# Patient Record
Sex: Female | Born: 1945
Health system: Southern US, Community
[De-identification: ages and names within clinical notes are randomized; demographics above are authoritative.]

## PROBLEM LIST (undated history)

## (undated) DIAGNOSIS — G4730 Sleep apnea, unspecified: Secondary | ICD-10-CM

## (undated) DIAGNOSIS — M810 Age-related osteoporosis without current pathological fracture: Secondary | ICD-10-CM

## (undated) DIAGNOSIS — N390 Urinary tract infection, site not specified: Secondary | ICD-10-CM

## (undated) DIAGNOSIS — K219 Gastro-esophageal reflux disease without esophagitis: Secondary | ICD-10-CM

## (undated) DIAGNOSIS — N200 Calculus of kidney: Secondary | ICD-10-CM

## (undated) DIAGNOSIS — M1990 Unspecified osteoarthritis, unspecified site: Secondary | ICD-10-CM

## (undated) DIAGNOSIS — J449 Chronic obstructive pulmonary disease, unspecified: Secondary | ICD-10-CM

## (undated) DIAGNOSIS — N189 Chronic kidney disease, unspecified: Secondary | ICD-10-CM

## (undated) DIAGNOSIS — I240 Acute coronary thrombosis not resulting in myocardial infarction: Secondary | ICD-10-CM

## (undated) DIAGNOSIS — I878 Other specified disorders of veins: Secondary | ICD-10-CM

## (undated) DIAGNOSIS — F419 Anxiety disorder, unspecified: Secondary | ICD-10-CM

## (undated) DIAGNOSIS — K635 Polyp of colon: Secondary | ICD-10-CM

## (undated) DIAGNOSIS — J45909 Unspecified asthma, uncomplicated: Secondary | ICD-10-CM

## (undated) DIAGNOSIS — E119 Type 2 diabetes mellitus without complications: Secondary | ICD-10-CM

## (undated) DIAGNOSIS — H9190 Unspecified hearing loss, unspecified ear: Secondary | ICD-10-CM

## (undated) HISTORY — PX: OTHER SURGICAL HISTORY: SHX169

## (undated) HISTORY — PX: COLONOSCOPY: SHX174

## (undated) HISTORY — DX: Urinary tract infection, site not specified: N39.0

## (undated) HISTORY — PX: APPENDECTOMY: SHX54

## (undated) HISTORY — DX: Calculus of kidney: N20.0

## (undated) HISTORY — PX: CHOLECYSTECTOMY: SHX55

## (undated) HISTORY — DX: Acute coronary thrombosis not resulting in myocardial infarction: I24.0

---

## 1968-08-28 HISTORY — PX: OTHER SURGICAL HISTORY: SHX169

## 2006-11-27 ENCOUNTER — Ambulatory Visit: Payer: Self-pay | Admitting: Orthopaedic Surgery

## 2007-07-24 ENCOUNTER — Inpatient Hospital Stay: Payer: Self-pay | Admitting: *Deleted

## 2007-09-02 ENCOUNTER — Ambulatory Visit: Payer: Self-pay | Admitting: Internal Medicine

## 2007-09-27 ENCOUNTER — Ambulatory Visit: Payer: Self-pay | Admitting: Urology

## 2007-10-22 ENCOUNTER — Ambulatory Visit: Payer: Self-pay | Admitting: Internal Medicine

## 2008-09-23 ENCOUNTER — Ambulatory Visit: Payer: Self-pay | Admitting: Urology

## 2008-11-19 ENCOUNTER — Ambulatory Visit: Payer: Self-pay | Admitting: Internal Medicine

## 2009-02-07 ENCOUNTER — Emergency Department: Payer: Self-pay | Admitting: Emergency Medicine

## 2009-05-11 ENCOUNTER — Encounter: Payer: Self-pay | Admitting: Internal Medicine

## 2009-05-28 ENCOUNTER — Encounter: Payer: Self-pay | Admitting: Internal Medicine

## 2009-06-28 ENCOUNTER — Encounter: Payer: Self-pay | Admitting: Internal Medicine

## 2009-07-28 ENCOUNTER — Encounter: Payer: Self-pay | Admitting: Internal Medicine

## 2009-08-28 ENCOUNTER — Encounter: Payer: Self-pay | Admitting: Internal Medicine

## 2009-08-28 HISTORY — PX: ROTATOR CUFF REPAIR: SHX139

## 2009-09-22 ENCOUNTER — Ambulatory Visit: Payer: Self-pay | Admitting: Urology

## 2009-10-14 ENCOUNTER — Ambulatory Visit: Payer: Self-pay | Admitting: Urology

## 2009-10-26 ENCOUNTER — Ambulatory Visit: Payer: Self-pay | Admitting: Urology

## 2010-01-21 ENCOUNTER — Ambulatory Visit: Payer: Self-pay | Admitting: Internal Medicine

## 2010-02-02 ENCOUNTER — Emergency Department: Payer: Self-pay | Admitting: Unknown Physician Specialty

## 2010-03-09 ENCOUNTER — Ambulatory Visit: Payer: Self-pay | Admitting: Urology

## 2010-03-17 ENCOUNTER — Ambulatory Visit: Payer: Self-pay | Admitting: Gastroenterology

## 2010-03-21 LAB — PATHOLOGY REPORT

## 2010-06-25 ENCOUNTER — Ambulatory Visit: Payer: Self-pay

## 2010-11-23 ENCOUNTER — Ambulatory Visit: Payer: Self-pay | Admitting: Urology

## 2011-06-05 ENCOUNTER — Ambulatory Visit: Payer: Self-pay | Admitting: Internal Medicine

## 2011-09-05 DIAGNOSIS — J441 Chronic obstructive pulmonary disease with (acute) exacerbation: Secondary | ICD-10-CM | POA: Diagnosis not present

## 2011-09-05 DIAGNOSIS — N390 Urinary tract infection, site not specified: Secondary | ICD-10-CM | POA: Diagnosis not present

## 2011-09-05 DIAGNOSIS — M159 Polyosteoarthritis, unspecified: Secondary | ICD-10-CM | POA: Diagnosis not present

## 2011-09-05 DIAGNOSIS — R300 Dysuria: Secondary | ICD-10-CM | POA: Diagnosis not present

## 2011-09-05 DIAGNOSIS — E782 Mixed hyperlipidemia: Secondary | ICD-10-CM | POA: Diagnosis not present

## 2011-10-25 DIAGNOSIS — R300 Dysuria: Secondary | ICD-10-CM | POA: Diagnosis not present

## 2011-10-25 DIAGNOSIS — R319 Hematuria, unspecified: Secondary | ICD-10-CM | POA: Diagnosis not present

## 2011-10-25 DIAGNOSIS — R1084 Generalized abdominal pain: Secondary | ICD-10-CM | POA: Diagnosis not present

## 2011-10-25 DIAGNOSIS — J45909 Unspecified asthma, uncomplicated: Secondary | ICD-10-CM | POA: Diagnosis not present

## 2011-10-25 DIAGNOSIS — IMO0002 Reserved for concepts with insufficient information to code with codable children: Secondary | ICD-10-CM | POA: Diagnosis not present

## 2011-11-02 ENCOUNTER — Ambulatory Visit: Payer: Self-pay

## 2011-11-02 DIAGNOSIS — I7090 Unspecified atherosclerosis: Secondary | ICD-10-CM | POA: Diagnosis not present

## 2011-11-02 DIAGNOSIS — N200 Calculus of kidney: Secondary | ICD-10-CM | POA: Diagnosis not present

## 2011-11-02 DIAGNOSIS — M5137 Other intervertebral disc degeneration, lumbosacral region: Secondary | ICD-10-CM | POA: Diagnosis not present

## 2011-11-02 DIAGNOSIS — R109 Unspecified abdominal pain: Secondary | ICD-10-CM | POA: Diagnosis not present

## 2011-11-02 DIAGNOSIS — Z9089 Acquired absence of other organs: Secondary | ICD-10-CM | POA: Diagnosis not present

## 2011-11-23 DIAGNOSIS — E782 Mixed hyperlipidemia: Secondary | ICD-10-CM | POA: Diagnosis not present

## 2011-11-23 DIAGNOSIS — R300 Dysuria: Secondary | ICD-10-CM | POA: Diagnosis not present

## 2011-11-23 DIAGNOSIS — IMO0002 Reserved for concepts with insufficient information to code with codable children: Secondary | ICD-10-CM | POA: Diagnosis not present

## 2011-11-23 DIAGNOSIS — M545 Low back pain: Secondary | ICD-10-CM | POA: Diagnosis not present

## 2011-11-23 DIAGNOSIS — R0602 Shortness of breath: Secondary | ICD-10-CM | POA: Diagnosis not present

## 2011-11-23 DIAGNOSIS — R609 Edema, unspecified: Secondary | ICD-10-CM | POA: Diagnosis not present

## 2011-11-27 ENCOUNTER — Ambulatory Visit: Payer: Self-pay | Admitting: Urology

## 2011-11-27 DIAGNOSIS — N3020 Other chronic cystitis without hematuria: Secondary | ICD-10-CM | POA: Diagnosis not present

## 2011-11-27 DIAGNOSIS — N3946 Mixed incontinence: Secondary | ICD-10-CM | POA: Diagnosis not present

## 2011-11-27 DIAGNOSIS — N200 Calculus of kidney: Secondary | ICD-10-CM | POA: Diagnosis not present

## 2011-11-27 DIAGNOSIS — Q615 Medullary cystic kidney: Secondary | ICD-10-CM | POA: Diagnosis not present

## 2011-12-01 DIAGNOSIS — R0609 Other forms of dyspnea: Secondary | ICD-10-CM | POA: Diagnosis not present

## 2011-12-01 DIAGNOSIS — R0989 Other specified symptoms and signs involving the circulatory and respiratory systems: Secondary | ICD-10-CM | POA: Diagnosis not present

## 2011-12-01 DIAGNOSIS — R609 Edema, unspecified: Secondary | ICD-10-CM | POA: Diagnosis not present

## 2011-12-05 DIAGNOSIS — E782 Mixed hyperlipidemia: Secondary | ICD-10-CM | POA: Diagnosis not present

## 2011-12-05 DIAGNOSIS — I10 Essential (primary) hypertension: Secondary | ICD-10-CM | POA: Diagnosis not present

## 2011-12-05 DIAGNOSIS — Z79899 Other long term (current) drug therapy: Secondary | ICD-10-CM | POA: Diagnosis not present

## 2011-12-05 DIAGNOSIS — R609 Edema, unspecified: Secondary | ICD-10-CM | POA: Diagnosis not present

## 2011-12-18 DIAGNOSIS — R609 Edema, unspecified: Secondary | ICD-10-CM | POA: Diagnosis not present

## 2011-12-18 DIAGNOSIS — G4720 Circadian rhythm sleep disorder, unspecified type: Secondary | ICD-10-CM | POA: Diagnosis not present

## 2011-12-18 DIAGNOSIS — G4710 Hypersomnia, unspecified: Secondary | ICD-10-CM | POA: Diagnosis not present

## 2011-12-18 DIAGNOSIS — G4730 Sleep apnea, unspecified: Secondary | ICD-10-CM | POA: Diagnosis not present

## 2011-12-26 DIAGNOSIS — Z0000 Encounter for general adult medical examination without abnormal findings: Secondary | ICD-10-CM | POA: Diagnosis not present

## 2011-12-26 DIAGNOSIS — R300 Dysuria: Secondary | ICD-10-CM | POA: Diagnosis not present

## 2011-12-26 DIAGNOSIS — R609 Edema, unspecified: Secondary | ICD-10-CM | POA: Diagnosis not present

## 2011-12-26 DIAGNOSIS — Z124 Encounter for screening for malignant neoplasm of cervix: Secondary | ICD-10-CM | POA: Diagnosis not present

## 2011-12-26 DIAGNOSIS — I8310 Varicose veins of unspecified lower extremity with inflammation: Secondary | ICD-10-CM | POA: Diagnosis not present

## 2012-01-02 DIAGNOSIS — R5383 Other fatigue: Secondary | ICD-10-CM | POA: Diagnosis not present

## 2012-01-02 DIAGNOSIS — J449 Chronic obstructive pulmonary disease, unspecified: Secondary | ICD-10-CM | POA: Diagnosis not present

## 2012-01-02 DIAGNOSIS — F17200 Nicotine dependence, unspecified, uncomplicated: Secondary | ICD-10-CM | POA: Diagnosis not present

## 2012-01-02 DIAGNOSIS — F419 Anxiety disorder, unspecified: Secondary | ICD-10-CM

## 2012-01-02 DIAGNOSIS — G4733 Obstructive sleep apnea (adult) (pediatric): Secondary | ICD-10-CM | POA: Diagnosis not present

## 2012-01-02 DIAGNOSIS — E669 Obesity, unspecified: Secondary | ICD-10-CM | POA: Insufficient documentation

## 2012-01-02 DIAGNOSIS — I253 Aneurysm of heart: Secondary | ICD-10-CM | POA: Diagnosis not present

## 2012-01-02 DIAGNOSIS — Z720 Tobacco use: Secondary | ICD-10-CM | POA: Insufficient documentation

## 2012-01-02 DIAGNOSIS — R609 Edema, unspecified: Secondary | ICD-10-CM | POA: Diagnosis not present

## 2012-01-02 DIAGNOSIS — H9190 Unspecified hearing loss, unspecified ear: Secondary | ICD-10-CM | POA: Insufficient documentation

## 2012-01-02 DIAGNOSIS — R0602 Shortness of breath: Secondary | ICD-10-CM | POA: Diagnosis not present

## 2012-01-02 DIAGNOSIS — K219 Gastro-esophageal reflux disease without esophagitis: Secondary | ICD-10-CM | POA: Insufficient documentation

## 2012-01-02 DIAGNOSIS — M79609 Pain in unspecified limb: Secondary | ICD-10-CM | POA: Diagnosis not present

## 2012-01-02 DIAGNOSIS — G4730 Sleep apnea, unspecified: Secondary | ICD-10-CM | POA: Insufficient documentation

## 2012-01-02 DIAGNOSIS — R5381 Other malaise: Secondary | ICD-10-CM | POA: Diagnosis not present

## 2012-01-02 DIAGNOSIS — I872 Venous insufficiency (chronic) (peripheral): Secondary | ICD-10-CM | POA: Insufficient documentation

## 2012-01-02 DIAGNOSIS — E785 Hyperlipidemia, unspecified: Secondary | ICD-10-CM | POA: Insufficient documentation

## 2012-01-02 DIAGNOSIS — E782 Mixed hyperlipidemia: Secondary | ICD-10-CM | POA: Insufficient documentation

## 2012-01-02 DIAGNOSIS — N200 Calculus of kidney: Secondary | ICD-10-CM | POA: Insufficient documentation

## 2012-01-02 HISTORY — DX: Anxiety disorder, unspecified: F41.9

## 2012-01-05 DIAGNOSIS — G4720 Circadian rhythm sleep disorder, unspecified type: Secondary | ICD-10-CM | POA: Diagnosis not present

## 2012-01-05 DIAGNOSIS — G4730 Sleep apnea, unspecified: Secondary | ICD-10-CM | POA: Diagnosis not present

## 2012-01-10 DIAGNOSIS — L608 Other nail disorders: Secondary | ICD-10-CM | POA: Diagnosis not present

## 2012-01-10 DIAGNOSIS — B351 Tinea unguium: Secondary | ICD-10-CM | POA: Diagnosis not present

## 2012-01-15 DIAGNOSIS — G4720 Circadian rhythm sleep disorder, unspecified type: Secondary | ICD-10-CM | POA: Diagnosis not present

## 2012-01-15 DIAGNOSIS — R609 Edema, unspecified: Secondary | ICD-10-CM | POA: Diagnosis not present

## 2012-01-15 DIAGNOSIS — G4730 Sleep apnea, unspecified: Secondary | ICD-10-CM | POA: Diagnosis not present

## 2012-01-17 DIAGNOSIS — G4730 Sleep apnea, unspecified: Secondary | ICD-10-CM | POA: Diagnosis not present

## 2012-01-17 DIAGNOSIS — G4710 Hypersomnia, unspecified: Secondary | ICD-10-CM | POA: Diagnosis not present

## 2012-01-25 ENCOUNTER — Ambulatory Visit: Payer: Self-pay | Admitting: Internal Medicine

## 2012-01-25 DIAGNOSIS — Z1231 Encounter for screening mammogram for malignant neoplasm of breast: Secondary | ICD-10-CM | POA: Diagnosis not present

## 2012-01-25 DIAGNOSIS — H02059 Trichiasis without entropian unspecified eye, unspecified eyelid: Secondary | ICD-10-CM | POA: Diagnosis not present

## 2012-03-08 ENCOUNTER — Ambulatory Visit: Payer: Self-pay | Admitting: Internal Medicine

## 2012-03-08 DIAGNOSIS — L02419 Cutaneous abscess of limb, unspecified: Secondary | ICD-10-CM | POA: Diagnosis not present

## 2012-03-08 DIAGNOSIS — R609 Edema, unspecified: Secondary | ICD-10-CM | POA: Diagnosis not present

## 2012-03-08 DIAGNOSIS — M79609 Pain in unspecified limb: Secondary | ICD-10-CM | POA: Diagnosis not present

## 2012-03-08 DIAGNOSIS — M7989 Other specified soft tissue disorders: Secondary | ICD-10-CM | POA: Diagnosis not present

## 2012-03-12 DIAGNOSIS — J449 Chronic obstructive pulmonary disease, unspecified: Secondary | ICD-10-CM | POA: Diagnosis not present

## 2012-03-12 DIAGNOSIS — L02419 Cutaneous abscess of limb, unspecified: Secondary | ICD-10-CM | POA: Diagnosis not present

## 2012-03-12 DIAGNOSIS — I253 Aneurysm of heart: Secondary | ICD-10-CM | POA: Diagnosis not present

## 2012-03-12 DIAGNOSIS — L03119 Cellulitis of unspecified part of limb: Secondary | ICD-10-CM | POA: Diagnosis not present

## 2012-03-12 DIAGNOSIS — E782 Mixed hyperlipidemia: Secondary | ICD-10-CM | POA: Diagnosis not present

## 2012-03-19 DIAGNOSIS — R609 Edema, unspecified: Secondary | ICD-10-CM | POA: Diagnosis not present

## 2012-03-19 DIAGNOSIS — I739 Peripheral vascular disease, unspecified: Secondary | ICD-10-CM | POA: Diagnosis not present

## 2012-03-19 DIAGNOSIS — L02419 Cutaneous abscess of limb, unspecified: Secondary | ICD-10-CM | POA: Diagnosis not present

## 2012-03-20 DIAGNOSIS — L259 Unspecified contact dermatitis, unspecified cause: Secondary | ICD-10-CM | POA: Diagnosis not present

## 2012-03-20 DIAGNOSIS — L8990 Pressure ulcer of unspecified site, unspecified stage: Secondary | ICD-10-CM | POA: Diagnosis not present

## 2012-03-26 DIAGNOSIS — D485 Neoplasm of uncertain behavior of skin: Secondary | ICD-10-CM | POA: Diagnosis not present

## 2012-03-26 DIAGNOSIS — I8310 Varicose veins of unspecified lower extremity with inflammation: Secondary | ICD-10-CM | POA: Diagnosis not present

## 2012-03-27 DIAGNOSIS — L02419 Cutaneous abscess of limb, unspecified: Secondary | ICD-10-CM | POA: Diagnosis not present

## 2012-03-27 DIAGNOSIS — B373 Candidiasis of vulva and vagina: Secondary | ICD-10-CM | POA: Diagnosis not present

## 2012-03-27 DIAGNOSIS — I739 Peripheral vascular disease, unspecified: Secondary | ICD-10-CM | POA: Diagnosis not present

## 2012-03-27 DIAGNOSIS — L03119 Cellulitis of unspecified part of limb: Secondary | ICD-10-CM | POA: Diagnosis not present

## 2012-03-28 DIAGNOSIS — H903 Sensorineural hearing loss, bilateral: Secondary | ICD-10-CM | POA: Diagnosis not present

## 2012-04-02 DIAGNOSIS — D485 Neoplasm of uncertain behavior of skin: Secondary | ICD-10-CM | POA: Diagnosis not present

## 2012-04-02 DIAGNOSIS — I8310 Varicose veins of unspecified lower extremity with inflammation: Secondary | ICD-10-CM | POA: Diagnosis not present

## 2012-04-08 DIAGNOSIS — D485 Neoplasm of uncertain behavior of skin: Secondary | ICD-10-CM | POA: Diagnosis not present

## 2012-04-08 DIAGNOSIS — I8310 Varicose veins of unspecified lower extremity with inflammation: Secondary | ICD-10-CM | POA: Diagnosis not present

## 2012-04-10 DIAGNOSIS — H903 Sensorineural hearing loss, bilateral: Secondary | ICD-10-CM | POA: Diagnosis not present

## 2012-04-10 DIAGNOSIS — I8310 Varicose veins of unspecified lower extremity with inflammation: Secondary | ICD-10-CM | POA: Diagnosis not present

## 2012-04-10 DIAGNOSIS — H9120 Sudden idiopathic hearing loss, unspecified ear: Secondary | ICD-10-CM | POA: Diagnosis not present

## 2012-04-16 DIAGNOSIS — I8310 Varicose veins of unspecified lower extremity with inflammation: Secondary | ICD-10-CM | POA: Diagnosis not present

## 2012-04-22 DIAGNOSIS — H60509 Unspecified acute noninfective otitis externa, unspecified ear: Secondary | ICD-10-CM | POA: Diagnosis not present

## 2012-04-22 DIAGNOSIS — I8310 Varicose veins of unspecified lower extremity with inflammation: Secondary | ICD-10-CM | POA: Diagnosis not present

## 2012-04-22 DIAGNOSIS — H903 Sensorineural hearing loss, bilateral: Secondary | ICD-10-CM | POA: Diagnosis not present

## 2012-04-22 DIAGNOSIS — H9120 Sudden idiopathic hearing loss, unspecified ear: Secondary | ICD-10-CM | POA: Diagnosis not present

## 2012-04-30 DIAGNOSIS — R609 Edema, unspecified: Secondary | ICD-10-CM | POA: Diagnosis not present

## 2012-04-30 DIAGNOSIS — I10 Essential (primary) hypertension: Secondary | ICD-10-CM | POA: Diagnosis not present

## 2012-04-30 DIAGNOSIS — I253 Aneurysm of heart: Secondary | ICD-10-CM | POA: Diagnosis not present

## 2012-04-30 DIAGNOSIS — B373 Candidiasis of vulva and vagina: Secondary | ICD-10-CM | POA: Diagnosis not present

## 2012-04-30 DIAGNOSIS — L03119 Cellulitis of unspecified part of limb: Secondary | ICD-10-CM | POA: Diagnosis not present

## 2012-04-30 DIAGNOSIS — G4730 Sleep apnea, unspecified: Secondary | ICD-10-CM | POA: Diagnosis not present

## 2012-04-30 DIAGNOSIS — L02419 Cutaneous abscess of limb, unspecified: Secondary | ICD-10-CM | POA: Diagnosis not present

## 2012-04-30 DIAGNOSIS — I8310 Varicose veins of unspecified lower extremity with inflammation: Secondary | ICD-10-CM | POA: Diagnosis not present

## 2012-05-07 DIAGNOSIS — H02059 Trichiasis without entropian unspecified eye, unspecified eyelid: Secondary | ICD-10-CM | POA: Diagnosis not present

## 2012-05-07 DIAGNOSIS — I8310 Varicose veins of unspecified lower extremity with inflammation: Secondary | ICD-10-CM | POA: Diagnosis not present

## 2012-05-09 DIAGNOSIS — G4730 Sleep apnea, unspecified: Secondary | ICD-10-CM | POA: Diagnosis not present

## 2012-05-09 DIAGNOSIS — G4710 Hypersomnia, unspecified: Secondary | ICD-10-CM | POA: Diagnosis not present

## 2012-05-09 DIAGNOSIS — G4720 Circadian rhythm sleep disorder, unspecified type: Secondary | ICD-10-CM | POA: Diagnosis not present

## 2012-05-09 DIAGNOSIS — R609 Edema, unspecified: Secondary | ICD-10-CM | POA: Diagnosis not present

## 2012-05-16 DIAGNOSIS — L98499 Non-pressure chronic ulcer of skin of other sites with unspecified severity: Secondary | ICD-10-CM | POA: Diagnosis not present

## 2012-05-16 DIAGNOSIS — I8310 Varicose veins of unspecified lower extremity with inflammation: Secondary | ICD-10-CM | POA: Diagnosis not present

## 2012-06-26 DIAGNOSIS — M25579 Pain in unspecified ankle and joints of unspecified foot: Secondary | ICD-10-CM | POA: Diagnosis not present

## 2012-06-26 DIAGNOSIS — Q169 Congenital malformation of ear causing impairment of hearing, unspecified: Secondary | ICD-10-CM | POA: Diagnosis not present

## 2012-06-26 DIAGNOSIS — H60399 Other infective otitis externa, unspecified ear: Secondary | ICD-10-CM | POA: Diagnosis not present

## 2012-06-26 DIAGNOSIS — I739 Peripheral vascular disease, unspecified: Secondary | ICD-10-CM | POA: Diagnosis not present

## 2012-06-26 DIAGNOSIS — I10 Essential (primary) hypertension: Secondary | ICD-10-CM | POA: Diagnosis not present

## 2012-06-26 DIAGNOSIS — R609 Edema, unspecified: Secondary | ICD-10-CM | POA: Diagnosis not present

## 2012-06-26 DIAGNOSIS — L98499 Non-pressure chronic ulcer of skin of other sites with unspecified severity: Secondary | ICD-10-CM | POA: Diagnosis not present

## 2012-06-26 DIAGNOSIS — H9190 Unspecified hearing loss, unspecified ear: Secondary | ICD-10-CM | POA: Diagnosis not present

## 2012-06-26 DIAGNOSIS — M7130 Other bursal cyst, unspecified site: Secondary | ICD-10-CM | POA: Diagnosis not present

## 2012-07-11 DIAGNOSIS — H9190 Unspecified hearing loss, unspecified ear: Secondary | ICD-10-CM | POA: Diagnosis not present

## 2012-07-11 DIAGNOSIS — R609 Edema, unspecified: Secondary | ICD-10-CM | POA: Diagnosis not present

## 2012-07-11 DIAGNOSIS — H60399 Other infective otitis externa, unspecified ear: Secondary | ICD-10-CM | POA: Diagnosis not present

## 2012-07-11 DIAGNOSIS — Z23 Encounter for immunization: Secondary | ICD-10-CM | POA: Diagnosis not present

## 2012-07-18 DIAGNOSIS — H60509 Unspecified acute noninfective otitis externa, unspecified ear: Secondary | ICD-10-CM | POA: Diagnosis not present

## 2012-07-18 DIAGNOSIS — H903 Sensorineural hearing loss, bilateral: Secondary | ICD-10-CM | POA: Diagnosis not present

## 2012-07-18 DIAGNOSIS — J342 Deviated nasal septum: Secondary | ICD-10-CM | POA: Diagnosis not present

## 2012-09-10 DIAGNOSIS — D485 Neoplasm of uncertain behavior of skin: Secondary | ICD-10-CM | POA: Diagnosis not present

## 2012-09-30 DIAGNOSIS — G4710 Hypersomnia, unspecified: Secondary | ICD-10-CM | POA: Diagnosis not present

## 2012-09-30 DIAGNOSIS — G4730 Sleep apnea, unspecified: Secondary | ICD-10-CM | POA: Diagnosis not present

## 2012-09-30 DIAGNOSIS — R319 Hematuria, unspecified: Secondary | ICD-10-CM | POA: Diagnosis not present

## 2012-09-30 DIAGNOSIS — R300 Dysuria: Secondary | ICD-10-CM | POA: Diagnosis not present

## 2012-09-30 DIAGNOSIS — IMO0002 Reserved for concepts with insufficient information to code with codable children: Secondary | ICD-10-CM | POA: Diagnosis not present

## 2012-09-30 DIAGNOSIS — N390 Urinary tract infection, site not specified: Secondary | ICD-10-CM | POA: Diagnosis not present

## 2012-09-30 DIAGNOSIS — I739 Peripheral vascular disease, unspecified: Secondary | ICD-10-CM | POA: Diagnosis not present

## 2012-09-30 DIAGNOSIS — H9190 Unspecified hearing loss, unspecified ear: Secondary | ICD-10-CM | POA: Diagnosis not present

## 2012-09-30 DIAGNOSIS — I10 Essential (primary) hypertension: Secondary | ICD-10-CM | POA: Diagnosis not present

## 2012-10-23 DIAGNOSIS — H903 Sensorineural hearing loss, bilateral: Secondary | ICD-10-CM | POA: Diagnosis not present

## 2012-10-23 DIAGNOSIS — J342 Deviated nasal septum: Secondary | ICD-10-CM | POA: Diagnosis not present

## 2012-11-19 DIAGNOSIS — N3946 Mixed incontinence: Secondary | ICD-10-CM | POA: Insufficient documentation

## 2012-11-19 DIAGNOSIS — Q615 Medullary cystic kidney: Secondary | ICD-10-CM | POA: Insufficient documentation

## 2012-11-19 DIAGNOSIS — N3020 Other chronic cystitis without hematuria: Secondary | ICD-10-CM | POA: Insufficient documentation

## 2012-11-19 DIAGNOSIS — R339 Retention of urine, unspecified: Secondary | ICD-10-CM | POA: Insufficient documentation

## 2012-11-27 ENCOUNTER — Ambulatory Visit: Payer: Self-pay | Admitting: Urology

## 2012-11-27 DIAGNOSIS — R350 Frequency of micturition: Secondary | ICD-10-CM | POA: Diagnosis not present

## 2012-11-27 DIAGNOSIS — N23 Unspecified renal colic: Secondary | ICD-10-CM | POA: Diagnosis not present

## 2012-11-27 DIAGNOSIS — N3020 Other chronic cystitis without hematuria: Secondary | ICD-10-CM | POA: Diagnosis not present

## 2012-11-27 DIAGNOSIS — N3946 Mixed incontinence: Secondary | ICD-10-CM | POA: Diagnosis not present

## 2012-11-27 DIAGNOSIS — N200 Calculus of kidney: Secondary | ICD-10-CM | POA: Diagnosis not present

## 2012-12-18 DIAGNOSIS — G4720 Circadian rhythm sleep disorder, unspecified type: Secondary | ICD-10-CM | POA: Diagnosis not present

## 2012-12-18 DIAGNOSIS — R0602 Shortness of breath: Secondary | ICD-10-CM | POA: Diagnosis not present

## 2012-12-18 DIAGNOSIS — G4710 Hypersomnia, unspecified: Secondary | ICD-10-CM | POA: Diagnosis not present

## 2012-12-30 DIAGNOSIS — J45909 Unspecified asthma, uncomplicated: Secondary | ICD-10-CM | POA: Diagnosis not present

## 2012-12-30 DIAGNOSIS — H9190 Unspecified hearing loss, unspecified ear: Secondary | ICD-10-CM | POA: Diagnosis not present

## 2012-12-30 DIAGNOSIS — I739 Peripheral vascular disease, unspecified: Secondary | ICD-10-CM | POA: Diagnosis not present

## 2012-12-30 DIAGNOSIS — R609 Edema, unspecified: Secondary | ICD-10-CM | POA: Diagnosis not present

## 2012-12-30 DIAGNOSIS — M25579 Pain in unspecified ankle and joints of unspecified foot: Secondary | ICD-10-CM | POA: Diagnosis not present

## 2012-12-30 DIAGNOSIS — M545 Low back pain: Secondary | ICD-10-CM | POA: Diagnosis not present

## 2012-12-30 DIAGNOSIS — I10 Essential (primary) hypertension: Secondary | ICD-10-CM | POA: Diagnosis not present

## 2013-01-27 DIAGNOSIS — Z006 Encounter for examination for normal comparison and control in clinical research program: Secondary | ICD-10-CM | POA: Diagnosis not present

## 2013-01-27 DIAGNOSIS — I739 Peripheral vascular disease, unspecified: Secondary | ICD-10-CM | POA: Diagnosis not present

## 2013-01-27 DIAGNOSIS — J449 Chronic obstructive pulmonary disease, unspecified: Secondary | ICD-10-CM | POA: Diagnosis not present

## 2013-01-27 DIAGNOSIS — N393 Stress incontinence (female) (male): Secondary | ICD-10-CM | POA: Diagnosis not present

## 2013-01-27 DIAGNOSIS — G4730 Sleep apnea, unspecified: Secondary | ICD-10-CM | POA: Diagnosis not present

## 2013-01-27 DIAGNOSIS — G4710 Hypersomnia, unspecified: Secondary | ICD-10-CM | POA: Diagnosis not present

## 2013-01-27 DIAGNOSIS — E669 Obesity, unspecified: Secondary | ICD-10-CM | POA: Diagnosis not present

## 2013-01-28 DIAGNOSIS — H6520 Chronic serous otitis media, unspecified ear: Secondary | ICD-10-CM | POA: Diagnosis not present

## 2013-01-28 DIAGNOSIS — H6980 Other specified disorders of Eustachian tube, unspecified ear: Secondary | ICD-10-CM | POA: Diagnosis not present

## 2013-01-28 DIAGNOSIS — J069 Acute upper respiratory infection, unspecified: Secondary | ICD-10-CM | POA: Diagnosis not present

## 2013-02-18 ENCOUNTER — Ambulatory Visit: Payer: Self-pay | Admitting: Physician Assistant

## 2013-02-18 DIAGNOSIS — M545 Low back pain, unspecified: Secondary | ICD-10-CM | POA: Diagnosis not present

## 2013-02-18 DIAGNOSIS — I739 Peripheral vascular disease, unspecified: Secondary | ICD-10-CM | POA: Diagnosis not present

## 2013-02-18 DIAGNOSIS — E782 Mixed hyperlipidemia: Secondary | ICD-10-CM | POA: Diagnosis not present

## 2013-02-18 DIAGNOSIS — D649 Anemia, unspecified: Secondary | ICD-10-CM | POA: Diagnosis not present

## 2013-02-18 DIAGNOSIS — R609 Edema, unspecified: Secondary | ICD-10-CM | POA: Diagnosis not present

## 2013-02-18 DIAGNOSIS — E559 Vitamin D deficiency, unspecified: Secondary | ICD-10-CM | POA: Diagnosis not present

## 2013-02-18 DIAGNOSIS — I10 Essential (primary) hypertension: Secondary | ICD-10-CM | POA: Diagnosis not present

## 2013-02-18 DIAGNOSIS — I2510 Atherosclerotic heart disease of native coronary artery without angina pectoris: Secondary | ICD-10-CM | POA: Diagnosis not present

## 2013-02-18 DIAGNOSIS — M47817 Spondylosis without myelopathy or radiculopathy, lumbosacral region: Secondary | ICD-10-CM | POA: Diagnosis not present

## 2013-02-18 DIAGNOSIS — R209 Unspecified disturbances of skin sensation: Secondary | ICD-10-CM | POA: Diagnosis not present

## 2013-02-18 DIAGNOSIS — M5137 Other intervertebral disc degeneration, lumbosacral region: Secondary | ICD-10-CM | POA: Diagnosis not present

## 2013-03-05 DIAGNOSIS — Z1283 Encounter for screening for malignant neoplasm of skin: Secondary | ICD-10-CM | POA: Diagnosis not present

## 2013-03-05 DIAGNOSIS — J449 Chronic obstructive pulmonary disease, unspecified: Secondary | ICD-10-CM | POA: Diagnosis not present

## 2013-03-05 DIAGNOSIS — L570 Actinic keratosis: Secondary | ICD-10-CM | POA: Diagnosis not present

## 2013-03-05 DIAGNOSIS — M79609 Pain in unspecified limb: Secondary | ICD-10-CM | POA: Diagnosis not present

## 2013-03-05 DIAGNOSIS — E782 Mixed hyperlipidemia: Secondary | ICD-10-CM | POA: Diagnosis not present

## 2013-03-05 DIAGNOSIS — D485 Neoplasm of uncertain behavior of skin: Secondary | ICD-10-CM | POA: Diagnosis not present

## 2013-03-05 DIAGNOSIS — H9190 Unspecified hearing loss, unspecified ear: Secondary | ICD-10-CM | POA: Diagnosis not present

## 2013-03-05 DIAGNOSIS — D235 Other benign neoplasm of skin of trunk: Secondary | ICD-10-CM | POA: Diagnosis not present

## 2013-03-05 DIAGNOSIS — I10 Essential (primary) hypertension: Secondary | ICD-10-CM | POA: Diagnosis not present

## 2013-03-05 DIAGNOSIS — D2310 Other benign neoplasm of skin of unspecified eyelid, including canthus: Secondary | ICD-10-CM | POA: Diagnosis not present

## 2013-03-05 DIAGNOSIS — Z872 Personal history of diseases of the skin and subcutaneous tissue: Secondary | ICD-10-CM | POA: Diagnosis not present

## 2013-03-05 DIAGNOSIS — E119 Type 2 diabetes mellitus without complications: Secondary | ICD-10-CM | POA: Diagnosis not present

## 2013-03-05 DIAGNOSIS — E559 Vitamin D deficiency, unspecified: Secondary | ICD-10-CM | POA: Diagnosis not present

## 2013-03-05 DIAGNOSIS — I739 Peripheral vascular disease, unspecified: Secondary | ICD-10-CM | POA: Diagnosis not present

## 2013-03-05 DIAGNOSIS — D2360 Other benign neoplasm of skin of unspecified upper limb, including shoulder: Secondary | ICD-10-CM | POA: Diagnosis not present

## 2013-03-05 DIAGNOSIS — I7090 Unspecified atherosclerosis: Secondary | ICD-10-CM | POA: Diagnosis not present

## 2013-03-24 ENCOUNTER — Ambulatory Visit: Payer: Self-pay | Admitting: Internal Medicine

## 2013-03-24 DIAGNOSIS — Z7189 Other specified counseling: Secondary | ICD-10-CM | POA: Diagnosis not present

## 2013-03-24 DIAGNOSIS — E119 Type 2 diabetes mellitus without complications: Secondary | ICD-10-CM | POA: Diagnosis not present

## 2013-03-27 DIAGNOSIS — M79609 Pain in unspecified limb: Secondary | ICD-10-CM | POA: Diagnosis not present

## 2013-03-28 DIAGNOSIS — E119 Type 2 diabetes mellitus without complications: Secondary | ICD-10-CM | POA: Diagnosis not present

## 2013-03-28 DIAGNOSIS — Z7189 Other specified counseling: Secondary | ICD-10-CM | POA: Diagnosis not present

## 2013-03-31 DIAGNOSIS — M79609 Pain in unspecified limb: Secondary | ICD-10-CM | POA: Diagnosis not present

## 2013-03-31 DIAGNOSIS — I8310 Varicose veins of unspecified lower extremity with inflammation: Secondary | ICD-10-CM | POA: Diagnosis not present

## 2013-03-31 DIAGNOSIS — I739 Peripheral vascular disease, unspecified: Secondary | ICD-10-CM | POA: Diagnosis not present

## 2013-03-31 DIAGNOSIS — I7090 Unspecified atherosclerosis: Secondary | ICD-10-CM | POA: Diagnosis not present

## 2013-03-31 DIAGNOSIS — I10 Essential (primary) hypertension: Secondary | ICD-10-CM | POA: Diagnosis not present

## 2013-03-31 DIAGNOSIS — H9190 Unspecified hearing loss, unspecified ear: Secondary | ICD-10-CM | POA: Diagnosis not present

## 2013-03-31 DIAGNOSIS — J449 Chronic obstructive pulmonary disease, unspecified: Secondary | ICD-10-CM | POA: Diagnosis not present

## 2013-03-31 DIAGNOSIS — E119 Type 2 diabetes mellitus without complications: Secondary | ICD-10-CM | POA: Diagnosis not present

## 2013-04-08 DIAGNOSIS — J45909 Unspecified asthma, uncomplicated: Secondary | ICD-10-CM | POA: Diagnosis not present

## 2013-04-08 DIAGNOSIS — R0602 Shortness of breath: Secondary | ICD-10-CM | POA: Diagnosis not present

## 2013-04-10 DIAGNOSIS — R5381 Other malaise: Secondary | ICD-10-CM | POA: Diagnosis not present

## 2013-04-10 DIAGNOSIS — E119 Type 2 diabetes mellitus without complications: Secondary | ICD-10-CM | POA: Diagnosis not present

## 2013-04-10 DIAGNOSIS — R5383 Other fatigue: Secondary | ICD-10-CM | POA: Diagnosis not present

## 2013-04-10 DIAGNOSIS — F329 Major depressive disorder, single episode, unspecified: Secondary | ICD-10-CM | POA: Diagnosis not present

## 2013-04-10 DIAGNOSIS — J45909 Unspecified asthma, uncomplicated: Secondary | ICD-10-CM | POA: Diagnosis not present

## 2013-04-10 DIAGNOSIS — J309 Allergic rhinitis, unspecified: Secondary | ICD-10-CM | POA: Diagnosis not present

## 2013-04-10 DIAGNOSIS — F411 Generalized anxiety disorder: Secondary | ICD-10-CM | POA: Diagnosis not present

## 2013-04-15 ENCOUNTER — Ambulatory Visit: Payer: Self-pay | Admitting: Internal Medicine

## 2013-04-15 DIAGNOSIS — IMO0002 Reserved for concepts with insufficient information to code with codable children: Secondary | ICD-10-CM | POA: Diagnosis not present

## 2013-04-15 DIAGNOSIS — M545 Low back pain, unspecified: Secondary | ICD-10-CM | POA: Diagnosis not present

## 2013-04-15 DIAGNOSIS — M5126 Other intervertebral disc displacement, lumbar region: Secondary | ICD-10-CM | POA: Diagnosis not present

## 2013-04-16 DIAGNOSIS — G4710 Hypersomnia, unspecified: Secondary | ICD-10-CM | POA: Diagnosis not present

## 2013-04-22 DIAGNOSIS — H7290 Unspecified perforation of tympanic membrane, unspecified ear: Secondary | ICD-10-CM | POA: Diagnosis not present

## 2013-04-22 DIAGNOSIS — H60509 Unspecified acute noninfective otitis externa, unspecified ear: Secondary | ICD-10-CM | POA: Diagnosis not present

## 2013-04-22 DIAGNOSIS — H6980 Other specified disorders of Eustachian tube, unspecified ear: Secondary | ICD-10-CM | POA: Diagnosis not present

## 2013-04-22 DIAGNOSIS — H903 Sensorineural hearing loss, bilateral: Secondary | ICD-10-CM | POA: Diagnosis not present

## 2013-04-23 ENCOUNTER — Ambulatory Visit: Payer: Self-pay | Admitting: Internal Medicine

## 2013-04-23 DIAGNOSIS — D2360 Other benign neoplasm of skin of unspecified upper limb, including shoulder: Secondary | ICD-10-CM | POA: Diagnosis not present

## 2013-04-23 DIAGNOSIS — H903 Sensorineural hearing loss, bilateral: Secondary | ICD-10-CM | POA: Diagnosis not present

## 2013-04-23 DIAGNOSIS — D485 Neoplasm of uncertain behavior of skin: Secondary | ICD-10-CM | POA: Diagnosis not present

## 2013-04-23 DIAGNOSIS — D235 Other benign neoplasm of skin of trunk: Secondary | ICD-10-CM | POA: Diagnosis not present

## 2013-04-28 ENCOUNTER — Ambulatory Visit: Payer: Self-pay | Admitting: Internal Medicine

## 2013-04-28 DIAGNOSIS — Z7189 Other specified counseling: Secondary | ICD-10-CM | POA: Diagnosis not present

## 2013-04-28 DIAGNOSIS — E119 Type 2 diabetes mellitus without complications: Secondary | ICD-10-CM | POA: Diagnosis not present

## 2013-04-30 DIAGNOSIS — F329 Major depressive disorder, single episode, unspecified: Secondary | ICD-10-CM | POA: Diagnosis not present

## 2013-04-30 DIAGNOSIS — M545 Low back pain: Secondary | ICD-10-CM | POA: Diagnosis not present

## 2013-04-30 DIAGNOSIS — I7090 Unspecified atherosclerosis: Secondary | ICD-10-CM | POA: Diagnosis not present

## 2013-04-30 DIAGNOSIS — I739 Peripheral vascular disease, unspecified: Secondary | ICD-10-CM | POA: Diagnosis not present

## 2013-04-30 DIAGNOSIS — H9190 Unspecified hearing loss, unspecified ear: Secondary | ICD-10-CM | POA: Diagnosis not present

## 2013-04-30 DIAGNOSIS — E119 Type 2 diabetes mellitus without complications: Secondary | ICD-10-CM | POA: Diagnosis not present

## 2013-04-30 DIAGNOSIS — I10 Essential (primary) hypertension: Secondary | ICD-10-CM | POA: Diagnosis not present

## 2013-04-30 DIAGNOSIS — M79609 Pain in unspecified limb: Secondary | ICD-10-CM | POA: Diagnosis not present

## 2013-05-06 DIAGNOSIS — M1610 Unilateral primary osteoarthritis, unspecified hip: Secondary | ICD-10-CM | POA: Diagnosis not present

## 2013-05-28 ENCOUNTER — Ambulatory Visit: Payer: Self-pay | Admitting: Internal Medicine

## 2013-06-02 ENCOUNTER — Ambulatory Visit: Payer: Self-pay | Admitting: Internal Medicine

## 2013-06-02 DIAGNOSIS — R509 Fever, unspecified: Secondary | ICD-10-CM | POA: Diagnosis not present

## 2013-06-02 DIAGNOSIS — R300 Dysuria: Secondary | ICD-10-CM | POA: Diagnosis not present

## 2013-06-02 DIAGNOSIS — E860 Dehydration: Secondary | ICD-10-CM | POA: Diagnosis not present

## 2013-06-02 DIAGNOSIS — R319 Hematuria, unspecified: Secondary | ICD-10-CM | POA: Diagnosis not present

## 2013-06-02 DIAGNOSIS — R109 Unspecified abdominal pain: Secondary | ICD-10-CM | POA: Diagnosis not present

## 2013-06-02 DIAGNOSIS — N390 Urinary tract infection, site not specified: Secondary | ICD-10-CM | POA: Diagnosis not present

## 2013-06-03 DIAGNOSIS — N200 Calculus of kidney: Secondary | ICD-10-CM | POA: Diagnosis not present

## 2013-06-03 DIAGNOSIS — N12 Tubulo-interstitial nephritis, not specified as acute or chronic: Secondary | ICD-10-CM | POA: Diagnosis not present

## 2013-06-09 DIAGNOSIS — K4090 Unilateral inguinal hernia, without obstruction or gangrene, not specified as recurrent: Secondary | ICD-10-CM | POA: Insufficient documentation

## 2013-06-09 DIAGNOSIS — N3946 Mixed incontinence: Secondary | ICD-10-CM | POA: Diagnosis not present

## 2013-06-09 DIAGNOSIS — N3020 Other chronic cystitis without hematuria: Secondary | ICD-10-CM | POA: Diagnosis not present

## 2013-06-09 DIAGNOSIS — K429 Umbilical hernia without obstruction or gangrene: Secondary | ICD-10-CM | POA: Insufficient documentation

## 2013-06-09 DIAGNOSIS — E119 Type 2 diabetes mellitus without complications: Secondary | ICD-10-CM | POA: Diagnosis not present

## 2013-06-09 DIAGNOSIS — R350 Frequency of micturition: Secondary | ICD-10-CM | POA: Diagnosis not present

## 2013-06-09 DIAGNOSIS — Q615 Medullary cystic kidney: Secondary | ICD-10-CM | POA: Diagnosis not present

## 2013-06-09 DIAGNOSIS — N200 Calculus of kidney: Secondary | ICD-10-CM | POA: Diagnosis not present

## 2013-06-10 DIAGNOSIS — I10 Essential (primary) hypertension: Secondary | ICD-10-CM | POA: Diagnosis not present

## 2013-06-10 DIAGNOSIS — G4710 Hypersomnia, unspecified: Secondary | ICD-10-CM | POA: Diagnosis not present

## 2013-06-10 DIAGNOSIS — H9190 Unspecified hearing loss, unspecified ear: Secondary | ICD-10-CM | POA: Diagnosis not present

## 2013-06-10 DIAGNOSIS — N390 Urinary tract infection, site not specified: Secondary | ICD-10-CM | POA: Diagnosis not present

## 2013-06-10 DIAGNOSIS — N200 Calculus of kidney: Secondary | ICD-10-CM | POA: Diagnosis not present

## 2013-06-10 DIAGNOSIS — E119 Type 2 diabetes mellitus without complications: Secondary | ICD-10-CM | POA: Diagnosis not present

## 2013-06-10 DIAGNOSIS — N12 Tubulo-interstitial nephritis, not specified as acute or chronic: Secondary | ICD-10-CM | POA: Diagnosis not present

## 2013-07-03 ENCOUNTER — Ambulatory Visit: Payer: Self-pay | Admitting: Urology

## 2013-07-03 DIAGNOSIS — Z01810 Encounter for preprocedural cardiovascular examination: Secondary | ICD-10-CM | POA: Diagnosis not present

## 2013-07-03 DIAGNOSIS — Z01812 Encounter for preprocedural laboratory examination: Secondary | ICD-10-CM | POA: Diagnosis not present

## 2013-07-03 DIAGNOSIS — E119 Type 2 diabetes mellitus without complications: Secondary | ICD-10-CM | POA: Diagnosis not present

## 2013-07-03 LAB — BASIC METABOLIC PANEL
Anion Gap: 2 — ABNORMAL LOW (ref 7–16)
BUN: 17 mg/dL (ref 7–18)
Calcium, Total: 9.5 mg/dL (ref 8.5–10.1)
Chloride: 108 mmol/L — ABNORMAL HIGH (ref 98–107)
EGFR (Non-African Amer.): >60
Glucose: 90 mg/dL (ref 65–99)
Sodium: 137 mmol/L (ref 136–145)

## 2013-07-15 ENCOUNTER — Ambulatory Visit: Payer: Self-pay | Admitting: Urology

## 2013-07-15 DIAGNOSIS — E119 Type 2 diabetes mellitus without complications: Secondary | ICD-10-CM | POA: Diagnosis not present

## 2013-07-15 DIAGNOSIS — Z23 Encounter for immunization: Secondary | ICD-10-CM | POA: Diagnosis not present

## 2013-07-15 DIAGNOSIS — N200 Calculus of kidney: Secondary | ICD-10-CM | POA: Diagnosis not present

## 2013-07-15 DIAGNOSIS — J811 Chronic pulmonary edema: Secondary | ICD-10-CM | POA: Diagnosis not present

## 2013-07-15 LAB — CBC WITH DIFFERENTIAL/PLATELET
Basophil #: 0.1 10*3/uL (ref 0.0–0.1)
Basophil %: 1.1 %
Eosinophil #: 0.1 10*3/uL (ref 0.0–0.7)
Eosinophil %: 1.5 %
HGB: 13.7 g/dL (ref 12.0–16.0)
Lymphocyte #: 2.4 10*3/uL (ref 1.0–3.6)
Lymphocyte %: 33.3 %
MCH: 30.1 pg (ref 26.0–34.0)
MCV: 88 fL (ref 80–100)
Monocyte #: 0.6 x10 3/mm (ref 0.2–0.9)
Monocyte %: 8.1 %
Neutrophil %: 56 %
RBC: 4.55 10*6/uL (ref 3.80–5.20)
RDW: 14.1 % (ref 11.5–14.5)

## 2013-07-16 DIAGNOSIS — E119 Type 2 diabetes mellitus without complications: Secondary | ICD-10-CM | POA: Diagnosis not present

## 2013-07-16 DIAGNOSIS — N200 Calculus of kidney: Secondary | ICD-10-CM | POA: Diagnosis not present

## 2013-07-16 DIAGNOSIS — Z23 Encounter for immunization: Secondary | ICD-10-CM | POA: Diagnosis not present

## 2013-07-16 LAB — BASIC METABOLIC PANEL
Calcium, Total: 7.8 mg/dL — ABNORMAL LOW (ref 8.5–10.1)
Chloride: 108 mmol/L — ABNORMAL HIGH (ref 98–107)
Co2: 25 mmol/L (ref 21–32)
Creatinine: 0.71 mg/dL (ref 0.60–1.30)
EGFR (African American): >60
EGFR (Non-African Amer.): >60
Glucose: 102 mg/dL — ABNORMAL HIGH (ref 65–99)
Osmolality: 276 (ref 275–301)
Potassium: 3.9 mmol/L (ref 3.5–5.1)
Sodium: 138 mmol/L (ref 136–145)

## 2013-07-16 LAB — CBC WITH DIFFERENTIAL/PLATELET
Basophil #: 0 10*3/uL (ref 0.0–0.1)
Basophil %: 0.4 %
Eosinophil #: 0 10*3/uL (ref 0.0–0.7)
Eosinophil %: 0.2 %
HCT: 31.3 % — ABNORMAL LOW (ref 35.0–47.0)
HGB: 10.4 g/dL — ABNORMAL LOW (ref 12.0–16.0)
Lymphocyte #: 1.1 10*3/uL (ref 1.0–3.6)
MCH: 29.5 pg (ref 26.0–34.0)
MCHC: 33.3 g/dL (ref 32.0–36.0)
MCV: 89 fL (ref 80–100)
Neutrophil #: 9.5 10*3/uL — ABNORMAL HIGH (ref 1.4–6.5)
Neutrophil %: 82.6 %
Platelet: 138 10*3/uL — ABNORMAL LOW (ref 150–440)
RBC: 3.52 10*6/uL — ABNORMAL LOW (ref 3.80–5.20)
WBC: 11.5 10*3/uL — ABNORMAL HIGH (ref 3.6–11.0)

## 2013-07-17 DIAGNOSIS — Z23 Encounter for immunization: Secondary | ICD-10-CM | POA: Diagnosis not present

## 2013-07-17 DIAGNOSIS — N200 Calculus of kidney: Secondary | ICD-10-CM | POA: Diagnosis not present

## 2013-07-17 DIAGNOSIS — E119 Type 2 diabetes mellitus without complications: Secondary | ICD-10-CM | POA: Diagnosis not present

## 2013-07-22 DIAGNOSIS — H903 Sensorineural hearing loss, bilateral: Secondary | ICD-10-CM | POA: Diagnosis not present

## 2013-07-28 DIAGNOSIS — Q615 Medullary cystic kidney: Secondary | ICD-10-CM | POA: Diagnosis not present

## 2013-07-28 DIAGNOSIS — N3020 Other chronic cystitis without hematuria: Secondary | ICD-10-CM | POA: Diagnosis not present

## 2013-07-28 DIAGNOSIS — N200 Calculus of kidney: Secondary | ICD-10-CM | POA: Diagnosis not present

## 2013-07-30 DIAGNOSIS — N3946 Mixed incontinence: Secondary | ICD-10-CM | POA: Diagnosis not present

## 2013-07-30 DIAGNOSIS — N200 Calculus of kidney: Secondary | ICD-10-CM | POA: Diagnosis not present

## 2013-07-30 DIAGNOSIS — N390 Urinary tract infection, site not specified: Secondary | ICD-10-CM | POA: Diagnosis not present

## 2013-07-30 DIAGNOSIS — IMO0002 Reserved for concepts with insufficient information to code with codable children: Secondary | ICD-10-CM | POA: Diagnosis not present

## 2013-08-03 DIAGNOSIS — N200 Calculus of kidney: Secondary | ICD-10-CM | POA: Diagnosis not present

## 2013-08-28 HISTORY — PX: LITHOTRIPSY: SUR834

## 2013-08-28 HISTORY — PX: EYE SURGERY: SHX253

## 2013-09-09 DIAGNOSIS — D485 Neoplasm of uncertain behavior of skin: Secondary | ICD-10-CM | POA: Diagnosis not present

## 2013-09-09 DIAGNOSIS — L723 Sebaceous cyst: Secondary | ICD-10-CM | POA: Diagnosis not present

## 2013-09-09 DIAGNOSIS — D2370 Other benign neoplasm of skin of unspecified lower limb, including hip: Secondary | ICD-10-CM | POA: Diagnosis not present

## 2013-09-09 DIAGNOSIS — Z872 Personal history of diseases of the skin and subcutaneous tissue: Secondary | ICD-10-CM | POA: Diagnosis not present

## 2013-09-09 DIAGNOSIS — L570 Actinic keratosis: Secondary | ICD-10-CM | POA: Diagnosis not present

## 2013-09-09 DIAGNOSIS — Z1283 Encounter for screening for malignant neoplasm of skin: Secondary | ICD-10-CM | POA: Diagnosis not present

## 2013-10-14 IMAGING — CT CT ABD-PELV W/O CM
1 of 2 series · 15 of 32 positions shown, 19 images · non-contrast
Comparison: none

REASON FOR EXAM: Call report [phone] hematuria   fever
COMMENTS:

PROCEDURE:     CT  - CT ABDOMEN AND PELVIS W[DATE]  [DATE]
RESULT:     History: Pain left side. Hematuria. Fever.
Comparison Study: CT 11/02/2011.
TECHNIQUE: Standard nonenhanced CT obtained. Evaluation in 3 dimensions on
separate workstation performed.

[Series 2: 3mm soft tissue · axial · 0.74mm/px · z∈[-832,-430]mm · 15 of 148 slices shown, 19 images]
[im 7/148  soft-tissue]
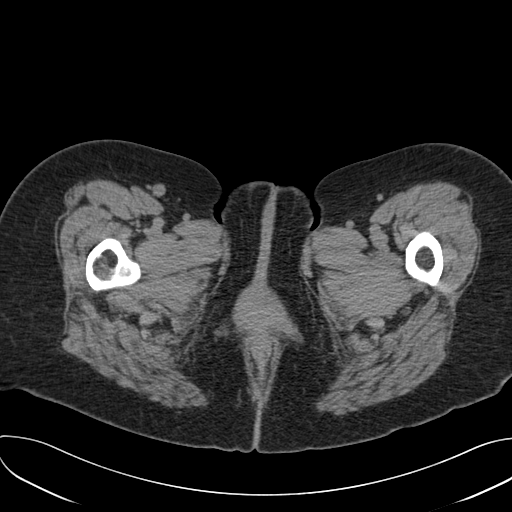
[im 7/148  bone]
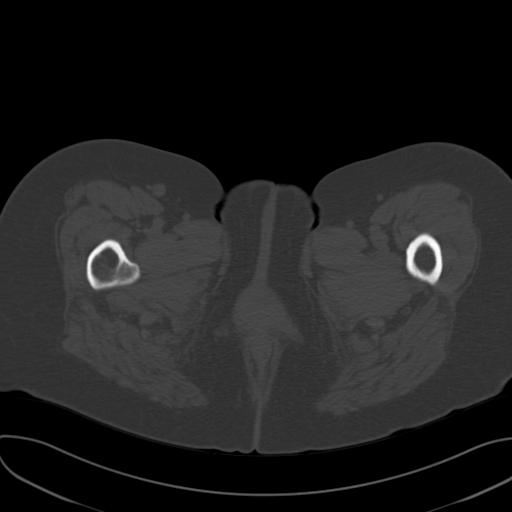
[im 19/148  soft-tissue]
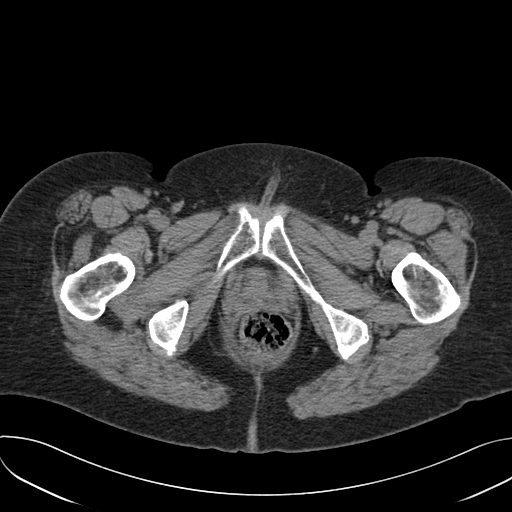
[im 31/148  soft-tissue]
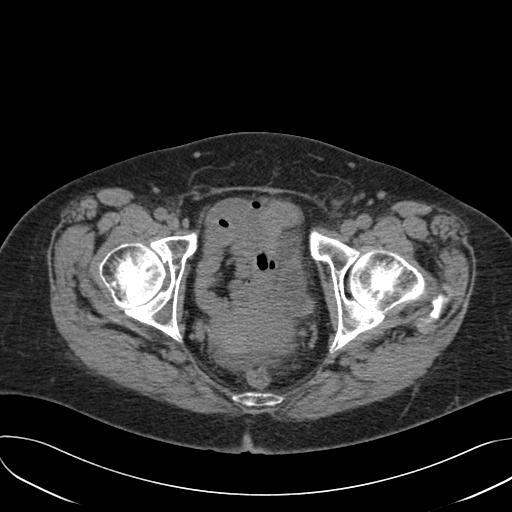
[im 43/148  soft-tissue]
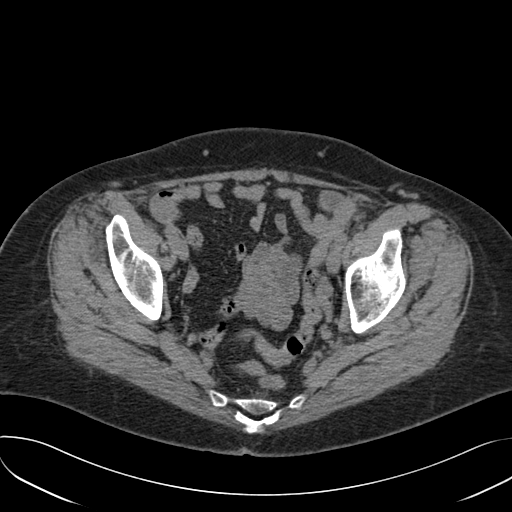
[im 50/148  soft-tissue]
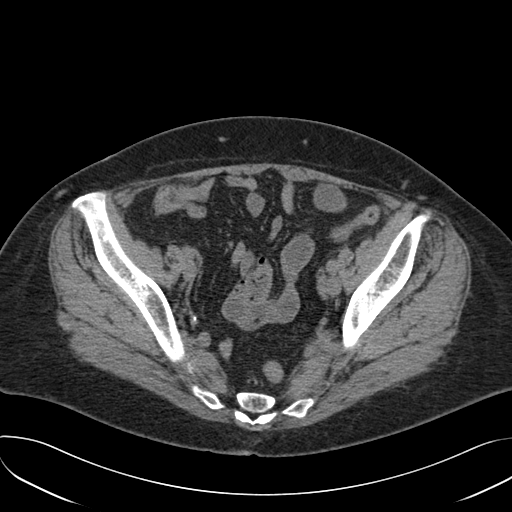
[im 62/148  soft-tissue]
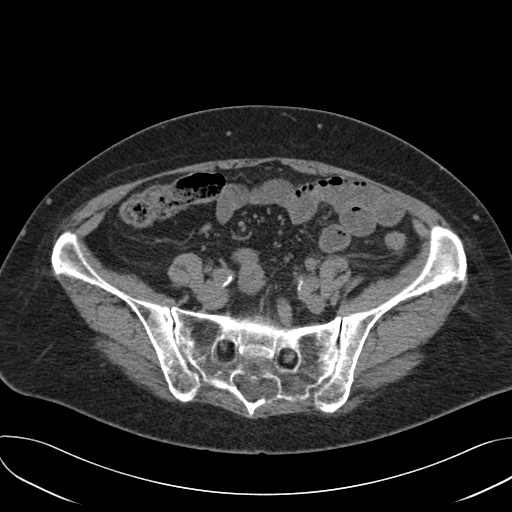
[im 74/148  soft-tissue]
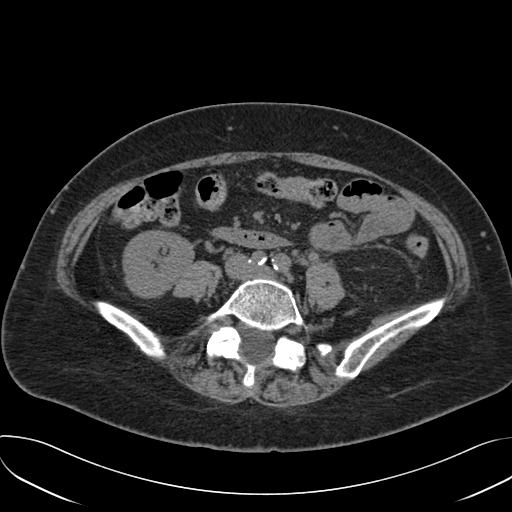
[im 86/148  soft-tissue]
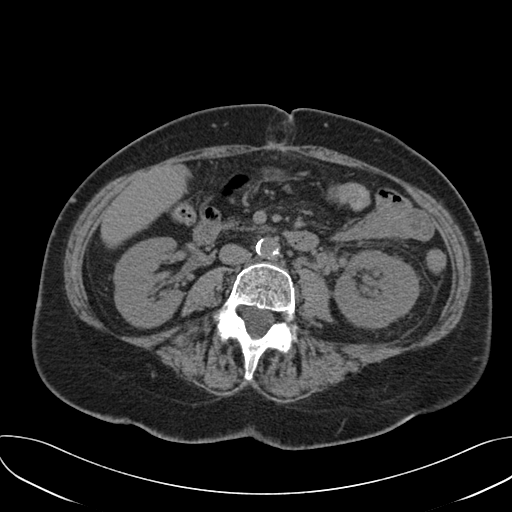
[im 99/148  soft-tissue]
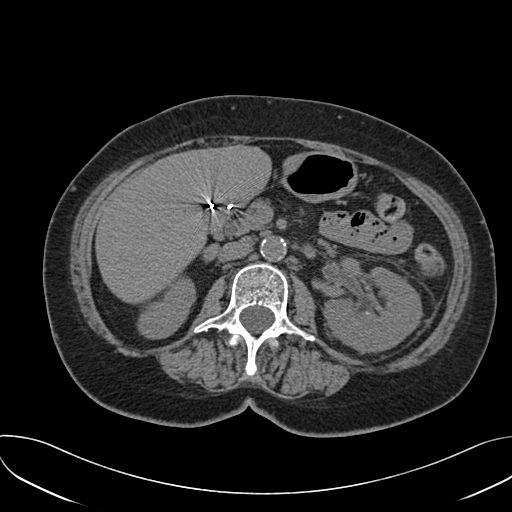
[im 99/148  bone]
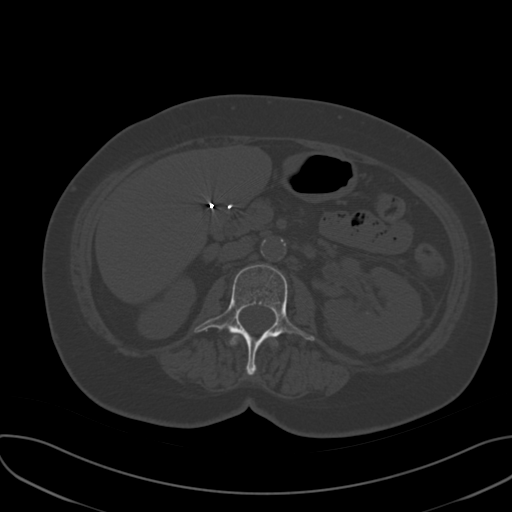
[im 105/148  soft-tissue]
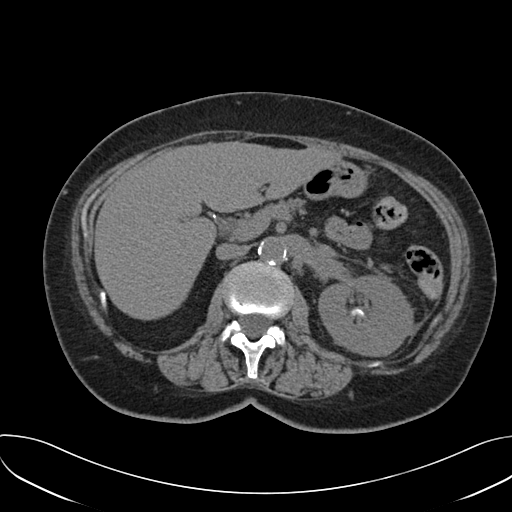
[im 117/148  soft-tissue]
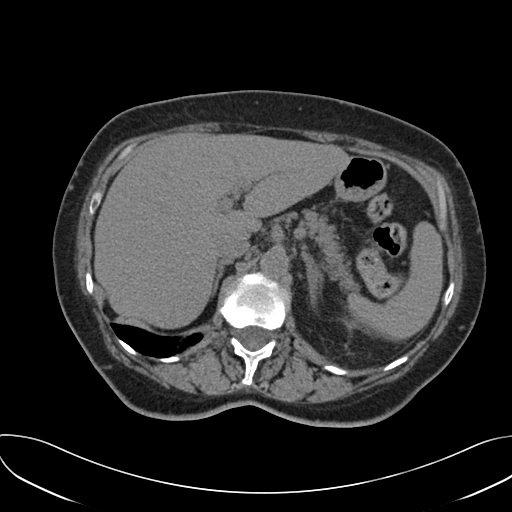
[im 123/148  lung]
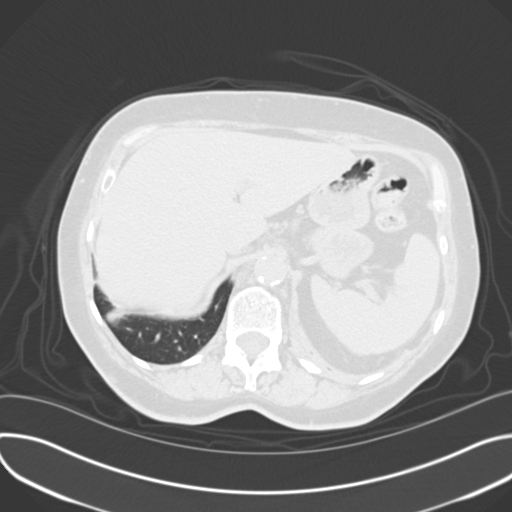
[im 129/148  soft-tissue]
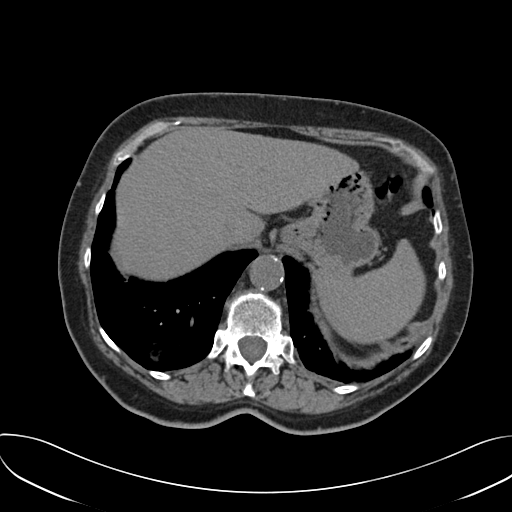
[im 129/148  lung]
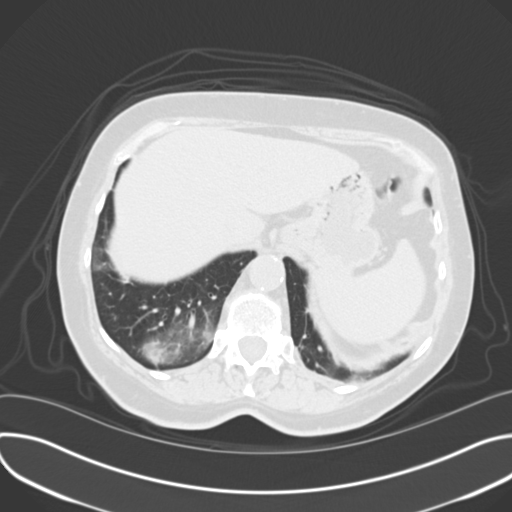
[im 135/148  lung]
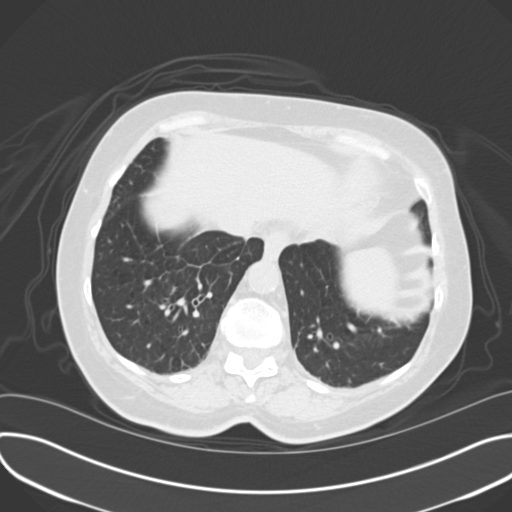
[im 141/148  soft-tissue]
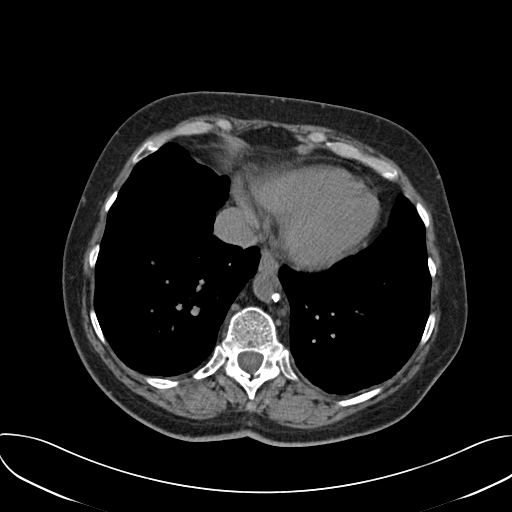
[im 141/148  lung]
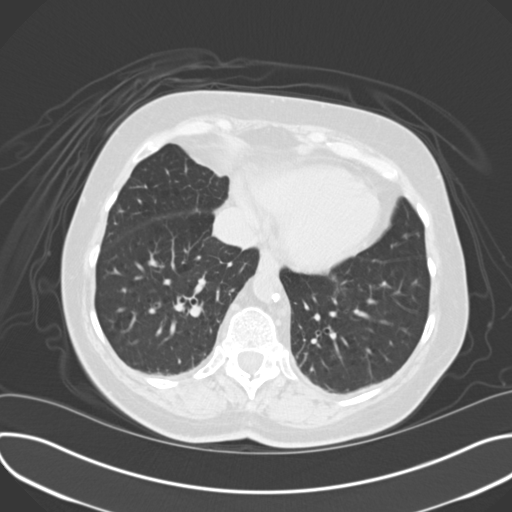

[15 of 32 positions shown; findings below may reference images not displayed]

FINDINGS: Liver normal. Spleen normal. Cholecystectomy. No biliary
distention. Pancreas normal.

Adrenals normal. Right kidney and ureter normal. 1.2 cm stone noted upper
pole calyx on the left. The left kidney is slightly swollen with perirenal
streaking. These findings suggest nephrolithiasis with pyelonephritis. Extra
renal pelvis is present on the left. Mild prominence of the ureter. No
ureteral stone is identified. Bladder is nondistended. Uterus and adnexa are
unremarkable. Small amount of free pelvic fluid.

Multiple small inguinal lymph nodes. Small retroperitoneal lymph nodes are
present. Aorta normal caliber.

Appendix is normal. Several slightly dilated loops of small bowel are noted.
Mild ileus cannot be excluded. No free air. Stomach is not distended. Small
umbilical hernia with herniation of fat only present.

Calcified granuloma right lung base. Atelectasis in the lung bases. Heart
size normal. Degenerative changes thoracic spine. Grade 1 spinal listhesis
of L4 on L5 present. Report phoned to patient's physician at time of study.
IMPRESSION: Findings suggesting left nephrolithiasis and
pyelonephritis. No obstructing ureteral stone is identified.

## 2013-10-28 DIAGNOSIS — E559 Vitamin D deficiency, unspecified: Secondary | ICD-10-CM | POA: Diagnosis not present

## 2013-10-30 DIAGNOSIS — I7090 Unspecified atherosclerosis: Secondary | ICD-10-CM | POA: Diagnosis not present

## 2013-10-30 DIAGNOSIS — F17200 Nicotine dependence, unspecified, uncomplicated: Secondary | ICD-10-CM | POA: Diagnosis not present

## 2013-10-30 DIAGNOSIS — E559 Vitamin D deficiency, unspecified: Secondary | ICD-10-CM | POA: Diagnosis not present

## 2013-10-30 DIAGNOSIS — E119 Type 2 diabetes mellitus without complications: Secondary | ICD-10-CM | POA: Diagnosis not present

## 2013-10-30 DIAGNOSIS — R300 Dysuria: Secondary | ICD-10-CM | POA: Diagnosis not present

## 2013-10-30 DIAGNOSIS — I739 Peripheral vascular disease, unspecified: Secondary | ICD-10-CM | POA: Diagnosis not present

## 2013-10-30 DIAGNOSIS — R209 Unspecified disturbances of skin sensation: Secondary | ICD-10-CM | POA: Diagnosis not present

## 2013-11-11 ENCOUNTER — Ambulatory Visit: Payer: Self-pay | Admitting: Physician Assistant

## 2013-11-11 DIAGNOSIS — Z1231 Encounter for screening mammogram for malignant neoplasm of breast: Secondary | ICD-10-CM | POA: Diagnosis not present

## 2013-11-24 DIAGNOSIS — H903 Sensorineural hearing loss, bilateral: Secondary | ICD-10-CM | POA: Diagnosis not present

## 2013-11-24 DIAGNOSIS — H60509 Unspecified acute noninfective otitis externa, unspecified ear: Secondary | ICD-10-CM | POA: Diagnosis not present

## 2013-11-26 IMAGING — CR DG CHEST 1V PORT
1 series · 1 of 1 positions shown · non-contrast
Comparison: None.

CLINICAL DATA: Shortness of breath status post PCNL

EXAM:
PORTABLE CHEST - 1 VIEW

[ap]
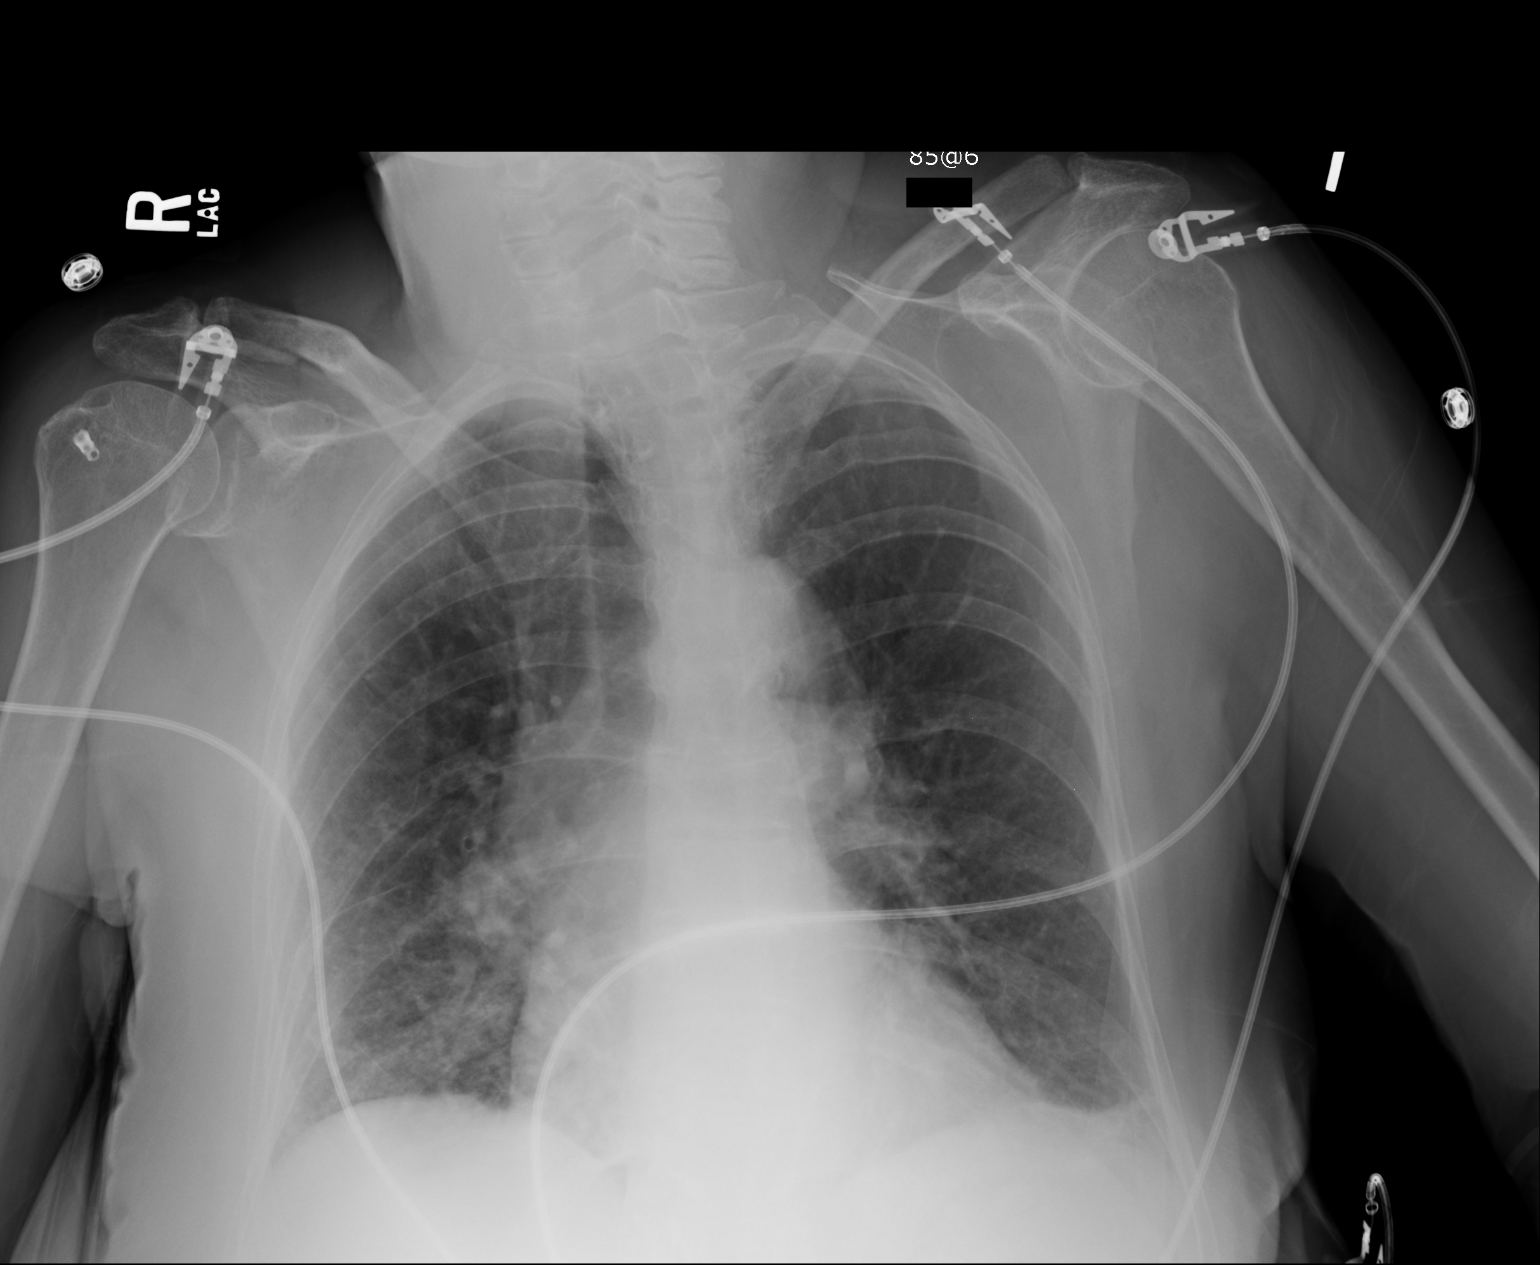

[1 of 1 positions shown; findings below may reference images not displayed]

FINDINGS: Patient is slightly rotated towards the right. The heart size and
mediastinal contours are within normal limits. There is mild
bibasilar interstitial thickening concerning for mild interstitial
edema. There is no focal consolidation, pleural effusion or
pneumothorax. The visualized skeletal structures are unremarkable.
IMPRESSION: Mild interstitial edema.

## 2014-01-14 ENCOUNTER — Ambulatory Visit: Payer: Self-pay | Admitting: Urology

## 2014-01-14 DIAGNOSIS — R339 Retention of urine, unspecified: Secondary | ICD-10-CM | POA: Diagnosis not present

## 2014-01-14 DIAGNOSIS — N23 Unspecified renal colic: Secondary | ICD-10-CM | POA: Diagnosis not present

## 2014-01-14 DIAGNOSIS — N200 Calculus of kidney: Secondary | ICD-10-CM | POA: Diagnosis not present

## 2014-01-14 DIAGNOSIS — Q615 Medullary cystic kidney: Secondary | ICD-10-CM | POA: Diagnosis not present

## 2014-01-14 DIAGNOSIS — R935 Abnormal findings on diagnostic imaging of other abdominal regions, including retroperitoneum: Secondary | ICD-10-CM | POA: Diagnosis not present

## 2014-01-14 DIAGNOSIS — R350 Frequency of micturition: Secondary | ICD-10-CM | POA: Diagnosis not present

## 2014-01-29 DIAGNOSIS — E559 Vitamin D deficiency, unspecified: Secondary | ICD-10-CM | POA: Diagnosis not present

## 2014-01-29 DIAGNOSIS — M810 Age-related osteoporosis without current pathological fracture: Secondary | ICD-10-CM | POA: Diagnosis not present

## 2014-01-29 DIAGNOSIS — M159 Polyosteoarthritis, unspecified: Secondary | ICD-10-CM | POA: Diagnosis not present

## 2014-01-29 DIAGNOSIS — E119 Type 2 diabetes mellitus without complications: Secondary | ICD-10-CM | POA: Diagnosis not present

## 2014-01-29 DIAGNOSIS — J449 Chronic obstructive pulmonary disease, unspecified: Secondary | ICD-10-CM | POA: Diagnosis not present

## 2014-01-29 DIAGNOSIS — I739 Peripheral vascular disease, unspecified: Secondary | ICD-10-CM | POA: Diagnosis not present

## 2014-02-24 DIAGNOSIS — E119 Type 2 diabetes mellitus without complications: Secondary | ICD-10-CM | POA: Diagnosis not present

## 2014-03-16 DIAGNOSIS — Z872 Personal history of diseases of the skin and subcutaneous tissue: Secondary | ICD-10-CM | POA: Diagnosis not present

## 2014-03-16 DIAGNOSIS — Z1283 Encounter for screening for malignant neoplasm of skin: Secondary | ICD-10-CM | POA: Diagnosis not present

## 2014-03-16 DIAGNOSIS — T148 Other injury of unspecified body region: Secondary | ICD-10-CM | POA: Diagnosis not present

## 2014-03-16 DIAGNOSIS — D485 Neoplasm of uncertain behavior of skin: Secondary | ICD-10-CM | POA: Diagnosis not present

## 2014-04-17 DIAGNOSIS — H663X9 Other chronic suppurative otitis media, unspecified ear: Secondary | ICD-10-CM | POA: Diagnosis not present

## 2014-04-17 DIAGNOSIS — H60509 Unspecified acute noninfective otitis externa, unspecified ear: Secondary | ICD-10-CM | POA: Diagnosis not present

## 2014-04-17 DIAGNOSIS — H903 Sensorineural hearing loss, bilateral: Secondary | ICD-10-CM | POA: Diagnosis not present

## 2014-05-26 DIAGNOSIS — D485 Neoplasm of uncertain behavior of skin: Secondary | ICD-10-CM | POA: Diagnosis not present

## 2014-05-28 DIAGNOSIS — E782 Mixed hyperlipidemia: Secondary | ICD-10-CM | POA: Diagnosis not present

## 2014-05-28 DIAGNOSIS — E119 Type 2 diabetes mellitus without complications: Secondary | ICD-10-CM | POA: Diagnosis not present

## 2014-05-28 DIAGNOSIS — M79609 Pain in unspecified limb: Secondary | ICD-10-CM | POA: Diagnosis not present

## 2014-05-28 DIAGNOSIS — E559 Vitamin D deficiency, unspecified: Secondary | ICD-10-CM | POA: Diagnosis not present

## 2014-05-29 DIAGNOSIS — H6531 Chronic mucoid otitis media, right ear: Secondary | ICD-10-CM | POA: Diagnosis not present

## 2014-05-29 DIAGNOSIS — H903 Sensorineural hearing loss, bilateral: Secondary | ICD-10-CM | POA: Diagnosis not present

## 2014-05-29 DIAGNOSIS — H6521 Chronic serous otitis media, right ear: Secondary | ICD-10-CM | POA: Diagnosis not present

## 2014-06-01 DIAGNOSIS — N3946 Mixed incontinence: Secondary | ICD-10-CM | POA: Diagnosis not present

## 2014-06-01 DIAGNOSIS — E119 Type 2 diabetes mellitus without complications: Secondary | ICD-10-CM | POA: Diagnosis not present

## 2014-06-01 DIAGNOSIS — M810 Age-related osteoporosis without current pathological fracture: Secondary | ICD-10-CM | POA: Diagnosis not present

## 2014-06-01 DIAGNOSIS — M7071 Other bursitis of hip, right hip: Secondary | ICD-10-CM | POA: Diagnosis not present

## 2014-06-01 DIAGNOSIS — M25551 Pain in right hip: Secondary | ICD-10-CM | POA: Diagnosis not present

## 2014-06-01 DIAGNOSIS — E782 Mixed hyperlipidemia: Secondary | ICD-10-CM | POA: Diagnosis not present

## 2014-06-01 DIAGNOSIS — J449 Chronic obstructive pulmonary disease, unspecified: Secondary | ICD-10-CM | POA: Diagnosis not present

## 2014-06-01 DIAGNOSIS — I739 Peripheral vascular disease, unspecified: Secondary | ICD-10-CM | POA: Diagnosis not present

## 2014-06-30 DIAGNOSIS — M545 Low back pain: Secondary | ICD-10-CM | POA: Diagnosis not present

## 2014-07-02 DIAGNOSIS — M545 Low back pain: Secondary | ICD-10-CM | POA: Diagnosis not present

## 2014-07-08 DIAGNOSIS — M545 Low back pain: Secondary | ICD-10-CM | POA: Diagnosis not present

## 2014-07-10 DIAGNOSIS — M545 Low back pain: Secondary | ICD-10-CM | POA: Diagnosis not present

## 2014-07-15 DIAGNOSIS — M545 Low back pain: Secondary | ICD-10-CM | POA: Diagnosis not present

## 2014-07-17 DIAGNOSIS — M545 Low back pain: Secondary | ICD-10-CM | POA: Diagnosis not present

## 2014-07-21 DIAGNOSIS — M545 Low back pain: Secondary | ICD-10-CM | POA: Diagnosis not present

## 2014-07-24 DIAGNOSIS — M545 Low back pain: Secondary | ICD-10-CM | POA: Diagnosis not present

## 2014-07-28 DIAGNOSIS — M545 Low back pain: Secondary | ICD-10-CM | POA: Diagnosis not present

## 2014-07-29 DIAGNOSIS — M545 Low back pain: Secondary | ICD-10-CM | POA: Diagnosis not present

## 2014-07-29 DIAGNOSIS — Z23 Encounter for immunization: Secondary | ICD-10-CM | POA: Diagnosis not present

## 2014-07-31 DIAGNOSIS — M545 Low back pain: Secondary | ICD-10-CM | POA: Diagnosis not present

## 2014-08-04 DIAGNOSIS — M545 Low back pain: Secondary | ICD-10-CM | POA: Diagnosis not present

## 2014-08-05 DIAGNOSIS — E1165 Type 2 diabetes mellitus with hyperglycemia: Secondary | ICD-10-CM | POA: Diagnosis not present

## 2014-08-05 DIAGNOSIS — F17210 Nicotine dependence, cigarettes, uncomplicated: Secondary | ICD-10-CM | POA: Diagnosis not present

## 2014-08-05 DIAGNOSIS — J309 Allergic rhinitis, unspecified: Secondary | ICD-10-CM | POA: Diagnosis not present

## 2014-08-05 DIAGNOSIS — H5310 Unspecified subjective visual disturbances: Secondary | ICD-10-CM | POA: Diagnosis not present

## 2014-08-05 DIAGNOSIS — M25551 Pain in right hip: Secondary | ICD-10-CM | POA: Diagnosis not present

## 2014-08-06 DIAGNOSIS — M545 Low back pain: Secondary | ICD-10-CM | POA: Diagnosis not present

## 2014-08-11 DIAGNOSIS — M545 Low back pain: Secondary | ICD-10-CM | POA: Diagnosis not present

## 2014-08-14 DIAGNOSIS — M545 Low back pain: Secondary | ICD-10-CM | POA: Diagnosis not present

## 2014-08-17 DIAGNOSIS — M545 Low back pain: Secondary | ICD-10-CM | POA: Diagnosis not present

## 2014-08-19 DIAGNOSIS — M545 Low back pain: Secondary | ICD-10-CM | POA: Diagnosis not present

## 2014-08-25 DIAGNOSIS — H02059 Trichiasis without entropian unspecified eye, unspecified eyelid: Secondary | ICD-10-CM | POA: Diagnosis not present

## 2014-09-10 DIAGNOSIS — M810 Age-related osteoporosis without current pathological fracture: Secondary | ICD-10-CM | POA: Diagnosis not present

## 2014-09-10 DIAGNOSIS — E782 Mixed hyperlipidemia: Secondary | ICD-10-CM | POA: Diagnosis not present

## 2014-09-10 DIAGNOSIS — M25551 Pain in right hip: Secondary | ICD-10-CM | POA: Diagnosis not present

## 2014-09-10 DIAGNOSIS — E559 Vitamin D deficiency, unspecified: Secondary | ICD-10-CM | POA: Diagnosis not present

## 2014-09-10 DIAGNOSIS — H9193 Unspecified hearing loss, bilateral: Secondary | ICD-10-CM | POA: Diagnosis not present

## 2014-09-10 DIAGNOSIS — F17210 Nicotine dependence, cigarettes, uncomplicated: Secondary | ICD-10-CM | POA: Diagnosis not present

## 2014-09-10 DIAGNOSIS — E1165 Type 2 diabetes mellitus with hyperglycemia: Secondary | ICD-10-CM | POA: Diagnosis not present

## 2014-09-15 DIAGNOSIS — M5416 Radiculopathy, lumbar region: Secondary | ICD-10-CM | POA: Diagnosis not present

## 2014-09-15 DIAGNOSIS — M1611 Unilateral primary osteoarthritis, right hip: Secondary | ICD-10-CM | POA: Diagnosis not present

## 2014-09-21 DIAGNOSIS — N958 Other specified menopausal and perimenopausal disorders: Secondary | ICD-10-CM | POA: Diagnosis not present

## 2014-09-21 DIAGNOSIS — Z13820 Encounter for screening for osteoporosis: Secondary | ICD-10-CM | POA: Diagnosis not present

## 2014-09-21 DIAGNOSIS — M8588 Other specified disorders of bone density and structure, other site: Secondary | ICD-10-CM | POA: Diagnosis not present

## 2014-09-21 DIAGNOSIS — M810 Age-related osteoporosis without current pathological fracture: Secondary | ICD-10-CM | POA: Diagnosis not present

## 2014-09-24 ENCOUNTER — Ambulatory Visit: Payer: Self-pay | Admitting: General Practice

## 2014-09-24 DIAGNOSIS — M47896 Other spondylosis, lumbar region: Secondary | ICD-10-CM | POA: Diagnosis not present

## 2014-09-24 DIAGNOSIS — M47816 Spondylosis without myelopathy or radiculopathy, lumbar region: Secondary | ICD-10-CM | POA: Diagnosis not present

## 2014-09-24 DIAGNOSIS — M5386 Other specified dorsopathies, lumbar region: Secondary | ICD-10-CM | POA: Diagnosis not present

## 2014-09-24 DIAGNOSIS — H43811 Vitreous degeneration, right eye: Secondary | ICD-10-CM | POA: Diagnosis not present

## 2014-10-07 ENCOUNTER — Ambulatory Visit: Payer: Self-pay | Admitting: Ophthalmology

## 2014-10-07 DIAGNOSIS — H2511 Age-related nuclear cataract, right eye: Secondary | ICD-10-CM | POA: Diagnosis not present

## 2014-10-07 DIAGNOSIS — Z01812 Encounter for preprocedural laboratory examination: Secondary | ICD-10-CM | POA: Diagnosis not present

## 2014-10-07 DIAGNOSIS — I10 Essential (primary) hypertension: Secondary | ICD-10-CM | POA: Diagnosis not present

## 2014-10-07 DIAGNOSIS — H2513 Age-related nuclear cataract, bilateral: Secondary | ICD-10-CM | POA: Diagnosis not present

## 2014-10-07 DIAGNOSIS — Z01810 Encounter for preprocedural cardiovascular examination: Secondary | ICD-10-CM | POA: Diagnosis not present

## 2014-10-13 DIAGNOSIS — M5416 Radiculopathy, lumbar region: Secondary | ICD-10-CM | POA: Diagnosis not present

## 2014-10-15 ENCOUNTER — Ambulatory Visit: Payer: Self-pay | Admitting: Ophthalmology

## 2014-10-15 DIAGNOSIS — G4730 Sleep apnea, unspecified: Secondary | ICD-10-CM | POA: Diagnosis not present

## 2014-10-15 DIAGNOSIS — J449 Chronic obstructive pulmonary disease, unspecified: Secondary | ICD-10-CM | POA: Diagnosis not present

## 2014-10-15 DIAGNOSIS — E119 Type 2 diabetes mellitus without complications: Secondary | ICD-10-CM | POA: Diagnosis not present

## 2014-10-15 DIAGNOSIS — Z87442 Personal history of urinary calculi: Secondary | ICD-10-CM | POA: Diagnosis not present

## 2014-10-15 DIAGNOSIS — H2513 Age-related nuclear cataract, bilateral: Secondary | ICD-10-CM | POA: Diagnosis not present

## 2014-10-15 DIAGNOSIS — E780 Pure hypercholesterolemia: Secondary | ICD-10-CM | POA: Diagnosis not present

## 2014-10-15 DIAGNOSIS — M1990 Unspecified osteoarthritis, unspecified site: Secondary | ICD-10-CM | POA: Diagnosis not present

## 2014-10-15 DIAGNOSIS — H2511 Age-related nuclear cataract, right eye: Secondary | ICD-10-CM | POA: Diagnosis not present

## 2014-10-15 DIAGNOSIS — J45909 Unspecified asthma, uncomplicated: Secondary | ICD-10-CM | POA: Diagnosis not present

## 2014-10-15 DIAGNOSIS — M810 Age-related osteoporosis without current pathological fracture: Secondary | ICD-10-CM | POA: Diagnosis not present

## 2014-10-29 ENCOUNTER — Ambulatory Visit: Payer: Self-pay | Admitting: Ophthalmology

## 2014-10-29 DIAGNOSIS — Z01812 Encounter for preprocedural laboratory examination: Secondary | ICD-10-CM | POA: Diagnosis not present

## 2014-10-29 DIAGNOSIS — H2512 Age-related nuclear cataract, left eye: Secondary | ICD-10-CM | POA: Diagnosis not present

## 2014-10-30 DIAGNOSIS — M5416 Radiculopathy, lumbar region: Secondary | ICD-10-CM | POA: Diagnosis not present

## 2014-10-30 DIAGNOSIS — M5136 Other intervertebral disc degeneration, lumbar region: Secondary | ICD-10-CM | POA: Diagnosis not present

## 2014-11-05 ENCOUNTER — Ambulatory Visit: Payer: Self-pay | Admitting: Ophthalmology

## 2014-11-05 DIAGNOSIS — Z87442 Personal history of urinary calculi: Secondary | ICD-10-CM | POA: Diagnosis not present

## 2014-11-05 DIAGNOSIS — M549 Dorsalgia, unspecified: Secondary | ICD-10-CM | POA: Diagnosis not present

## 2014-11-05 DIAGNOSIS — H2512 Age-related nuclear cataract, left eye: Secondary | ICD-10-CM | POA: Diagnosis not present

## 2014-11-05 DIAGNOSIS — I871 Compression of vein: Secondary | ICD-10-CM | POA: Diagnosis not present

## 2014-11-05 DIAGNOSIS — Z885 Allergy status to narcotic agent status: Secondary | ICD-10-CM | POA: Diagnosis not present

## 2014-11-05 DIAGNOSIS — Z791 Long term (current) use of non-steroidal anti-inflammatories (NSAID): Secondary | ICD-10-CM | POA: Diagnosis not present

## 2014-11-05 DIAGNOSIS — Z880 Allergy status to penicillin: Secondary | ICD-10-CM | POA: Diagnosis not present

## 2014-11-05 DIAGNOSIS — H269 Unspecified cataract: Secondary | ICD-10-CM | POA: Diagnosis not present

## 2014-11-05 DIAGNOSIS — R55 Syncope and collapse: Secondary | ICD-10-CM | POA: Diagnosis not present

## 2014-11-05 DIAGNOSIS — F17210 Nicotine dependence, cigarettes, uncomplicated: Secondary | ICD-10-CM | POA: Diagnosis not present

## 2014-11-05 DIAGNOSIS — E119 Type 2 diabetes mellitus without complications: Secondary | ICD-10-CM | POA: Diagnosis not present

## 2014-11-05 DIAGNOSIS — J449 Chronic obstructive pulmonary disease, unspecified: Secondary | ICD-10-CM | POA: Diagnosis not present

## 2014-11-05 DIAGNOSIS — Z91041 Radiographic dye allergy status: Secondary | ICD-10-CM | POA: Diagnosis not present

## 2014-11-05 DIAGNOSIS — E780 Pure hypercholesterolemia: Secondary | ICD-10-CM | POA: Diagnosis not present

## 2014-11-05 DIAGNOSIS — M1990 Unspecified osteoarthritis, unspecified site: Secondary | ICD-10-CM | POA: Diagnosis not present

## 2014-11-05 DIAGNOSIS — G4730 Sleep apnea, unspecified: Secondary | ICD-10-CM | POA: Diagnosis not present

## 2014-11-05 DIAGNOSIS — Z79899 Other long term (current) drug therapy: Secondary | ICD-10-CM | POA: Diagnosis not present

## 2014-11-05 DIAGNOSIS — J45909 Unspecified asthma, uncomplicated: Secondary | ICD-10-CM | POA: Diagnosis not present

## 2014-11-24 DIAGNOSIS — M5416 Radiculopathy, lumbar region: Secondary | ICD-10-CM | POA: Diagnosis not present

## 2014-11-24 DIAGNOSIS — M5136 Other intervertebral disc degeneration, lumbar region: Secondary | ICD-10-CM | POA: Diagnosis not present

## 2014-12-16 DIAGNOSIS — F17210 Nicotine dependence, cigarettes, uncomplicated: Secondary | ICD-10-CM | POA: Diagnosis not present

## 2014-12-16 DIAGNOSIS — Z0001 Encounter for general adult medical examination with abnormal findings: Secondary | ICD-10-CM | POA: Diagnosis not present

## 2014-12-16 DIAGNOSIS — H9193 Unspecified hearing loss, bilateral: Secondary | ICD-10-CM | POA: Diagnosis not present

## 2014-12-16 DIAGNOSIS — M810 Age-related osteoporosis without current pathological fracture: Secondary | ICD-10-CM | POA: Diagnosis not present

## 2014-12-16 DIAGNOSIS — R300 Dysuria: Secondary | ICD-10-CM | POA: Diagnosis not present

## 2014-12-16 DIAGNOSIS — E1165 Type 2 diabetes mellitus with hyperglycemia: Secondary | ICD-10-CM | POA: Diagnosis not present

## 2014-12-16 DIAGNOSIS — J449 Chronic obstructive pulmonary disease, unspecified: Secondary | ICD-10-CM | POA: Diagnosis not present

## 2014-12-16 DIAGNOSIS — E782 Mixed hyperlipidemia: Secondary | ICD-10-CM | POA: Diagnosis not present

## 2014-12-18 NOTE — Op Note (Signed)
PATIENT NAME:  Amber Holder, Amber Holder MR#:  [id number] DATE OF BIRTH:  1945/11/09  DATE OF PROCEDURE:  07/15/2013  PRINCIPAL DIAGNOSIS: Left nephrolithiasis.   POSTOPERATIVE DIAGNOSIS: Left nephrolithiasis.   PROCEDURE: Left percutaneous nephrolithotomy.   SURGEON: Edrick Oh, M.D.   ANESTHESIA: General endotracheal anesthesia.   INDICATIONS: The patient is a 69 year old white female with a history of nephrolithiasis. She has undergone previous ESWL for renal calculi. She recently developed significant flank pain, hematuria, and fever. She was found to have urinary tract infection. She has had continued infections and discomfort. A CT scan demonstrated an approximate 1.5 cm stone in an upper pole calyx. There was no evidence of hydronephrosis or obstruction with the continued pain and the lack of visualization of the stone under x-ray. The decision was made to proceed with percutaneous stone removal. She presents today for this purpose.   DESCRIPTION OF PROCEDURE: After informed consent was obtained, the patient was taken first to the interventional radiology suite, at which time she underwent a left nephroureteral stent placement by interventional radiology. This will be dictated under a separate operative note.   She was then taken to the operating room. She was placed in the prone position on the operating table under general endotracheal anesthesia. The patient was then prepped and draped in the usual standard fashion. The previously placed nephroureteral stent was utilized to facilitate guidewire passage into the urinary bladder. The nephroureteral stent was then removed. The skin and tract were dilated with 12-French with the 12-French dilator. The sheath was then placed over the stiffener. Through the sheath after removal of the stiffener, a Super Stiff guidewire was placed into the urinary bladder. The sheath was then removed. The stiffener was then placed over the Super Stiff guidewire. Dilation was  then undertaken from 14-French to 28-French. The 28-French sheath was then left in place. No significant problems were encountered during the dilation.   Nephroscopy was then performed. Some clot was noted within the upper pole calyx and renal pelvic region. This was removed utilizing the ultrasonic device. The UPJ could be easily identified. No stone was visible within the renal pelvis or lower calyceal region. The calyces were noted to be somewhat atypical in that the drainage system appears in a serpentine shape  with very flat superficial papillae throughout.   The scope was angled toward the upper pole. The calyx was not initially identified. The sheath was withdrawn slightly, allowing visualization of the upper pole calyx. The scope was able to be advanced without difficulty into the upper pole calyx. The tip of the stone was noted extending at the level of the calyx. The ultrasonic device was then utilized to fragment the stone into multiple smaller pieces and then subsequently suctioned free. The stone was noted to be very soft and friable. The scope was advanced further into the upper pole calyx, removing the remainder of the stone. No hard areas of stone were encountered. The calyx was completely cleaned. The scope was advanced back into the renal pelvic region. A clot of blood was noted at the UPJ region, limiting any potential for stone fragment passage into the ureter. A few small fragments were present within the renal pelvis. These were collected utilizing the ultrasonic device. No additional stones were appreciated.   The flexible cystoscope was then utilized to further evaluate a small lateral calyx and the remaining lower pole calyces. Once again the serpentine shape and relative shallowness of the papillae made visualization for stones very easy. No additional stones  or abnormalities were appreciated.   At the completion of the procedure, a 10-French nephrostomy tube was placed through the  sheath into the renal pelvis. The stiffener was removed. The suture was withdrawn, providing a curl within the renal pelvis. Contrast was injected performing a nephrostogram demonstrating good position. The sheath was then removed around the nephrostomy tube. The nephrostomy tube was secured to the skin utilizing a 0 Prolene suture. Three additional interrupted 0 Prolene sutures were then utilized to close the incision site.   The Super Stiff guidewire was then removed under fluoroscopic guidance. The safety wire was then removed also under fluoroscopic guidance without difficulty. Good positioning remained noted of the nephrostomy tube itself. The nephrostomy was placed to gravity drainage. The gauze dressing and micropore tape were then placed. The patient was returned to the supine position and awakened from general endotracheal anesthesia. She was taken to the recovery room in stable condition. There were no problems or complications. The patient tolerated the procedure well.   ESTIMATED BLOOD LOSS: Less than 75 mL.    ____________________________ Denice Bors. Jacqlyn Larsen, MD bsc:np D: 07/15/2013 14:09:02 ET T: 07/15/2013 15:14:09 ET JOB#: [id number]  cc: Denice Bors. Jacqlyn Larsen, MD, <Dictator> Denice Bors Lakashia Collison MD ELECTRONICALLY SIGNED 07/16/2013 7:52

## 2014-12-18 NOTE — Discharge Summary (Signed)
PATIENT NAME:  Amber Holder, Amber Holder MR#:  [id number] DATE OF BIRTH:  1946-08-24  DATE OF ADMISSION:  07/15/2013 DATE OF DISCHARGE: 07/17/2013   PRINCIPAL DIAGNOSIS: Left nephrolithiasis.   PROCEDURE: Left percutaneous nephrolithotomy and left percutaneous nephroureteral stent placement by interventional radiology.   INDICATIONS: The patient is a 69 year old white female with a history of nephrolithiasis. She was recently found to have an approximate 1.5 cm left upper pole renal calculus not visible on plain x-ray. She has been having recurrent bladder infections as well as pain and discomfort. She presents for percutaneous nephrolithotomy.   HOSPITAL COURSE: Amber Holder was taken to the operating room on 07/15/2013, at which time she underwent a percutaneous left nephrolithotomy. She had undergone a prior left percutaneous nephroureteral stent placement in interventional radiology. There were no intraoperative problems or complications. Minimal overall bleeding was present. Her postoperative course was without significant problems or complications. She remained afebrile, vital signs stable. Her blood pressure was low, but near her baseline. She did have some drop in hemoglobin postprocedure to 10.4. This was likely related to some bleed around the kidney post tube removal. Other parameters were well within normal limits. Her magnesium was noted to be low on postoperative day 1 and this was replaced. She had moderate pain, which was fairly easily controlled on oral and IV medications for the initial postoperative day. She underwent tube clamping the morning of postoperative day 1 with no significant flank pain or discomfort. She was ambulating without difficulty. She was tolerating a general diet. Her diabetes was well controlled. She continued to do well. She maintained good urine output. There was gradual clearing of the urine, which is routine for this procedure. She underwent left percutaneous nephrostomy tube  removal on the morning of postoperative day 2. She continued to do well and was subsequently discharged to home with instructions to follow up in 7 to 10 days for suture removal. She was discharged on Vicodin 1 to 2 every 4 to 6 hours as needed for pain and Cipro 500 mg twice daily for a 5-day course. She is to avoid tub bathing. She can keep the site covered for approximately 3 days. She can shower. She is to avoid any significant heavy lifting or straining. She is to avoid nonsteroidal medication use for at least 7 to 10 days. She is to notify us if there are any further problems or questions in the interim.   ____________________________ Denice Bors Jacqlyn Larsen, MD bsc:aw D: 07/17/2013 09:41:36 ET T: 07/17/2013 10:02:38 ET JOB#: [id number]  cc: Denice Bors. Jacqlyn Larsen, MD, <Dictator> Denice Bors Jeremy Ditullio MD ELECTRONICALLY SIGNED 07/21/2013 12:47

## 2014-12-21 DIAGNOSIS — Z872 Personal history of diseases of the skin and subcutaneous tissue: Secondary | ICD-10-CM | POA: Diagnosis not present

## 2014-12-21 DIAGNOSIS — Z1283 Encounter for screening for malignant neoplasm of skin: Secondary | ICD-10-CM | POA: Diagnosis not present

## 2014-12-27 NOTE — Op Note (Signed)
PATIENT NAME:  Amber Holder, Amber Holder MR#:  [id number] DATE OF BIRTH:  [birth year]  DATE OF PROCEDURE:  11/05/2014  PREOPERATIVE DIAGNOSIS:  Nuclear sclerotic cataract, left eye.  POSTOPERATIVE DIAGNOSIS:  Nuclear sclerotic cataract, left eye.  PROCEDURE:  Phacoemulsification with posterior chamber intraocular lens left eye, model SN60WF  SURGEON:  Lyla Glassing, MD  INDICATIONS:  This is a 69 year old female with decreased vision in the left eye.  PROCEDURE:  The risks and benefits of cataract surgery were discussed at length with the patient, including bleeding, infection, retinal detachment, re-operation, diplopia, ptosis, loss of vision, and loss of the eye. Informed consent was obtained. On the day of surgery, several sets of preoperative medication were administered to the operative eye including 0.5% tetracaine,1% cyclopentolate, 10% phenylephrine, 0.5% ketorolac, 0.5% gatifloxacin, and 2% lidocaine .  The patient was taken to the operating room and sedated via IV sedation. Topical tetracaine was placed in the eye. The operative eye was prepped using a diluted 10% Betadine solution and then covered in sterile drapes leaving only the operative eye exposed. A Lieberman lid speculum was placed to provide exposure. Using 0.12 forceps and a sideport blade, a paracentesis was created. Then a mixture of BSS, preservative free lidocaine, and epinephrine was injected into the anterior chamber. Next, a 2.4 mm keratome blade was used to create a two-step full-thickness clear corneal incision temporally. The cystitome and Utrata forceps were used to create a continuous capsulorrhexis in the anterior lens capsule. BSS on a hydrodissection cannula was used to perform gentle hydrodissection. Phacoemulsification was then performed to remove the nucleus. Irrigation and aspiration was performed to remove the remaining cortical material. Provisc was injected to fill the capsular bag and anterior chamber. A 22.5-diopter  SN60WF intraocular lens was injected into the capsular bag. The Connor wand was used to rotate it into proper position in the capsular bag. Irrigation and aspiration was performed to remove the remaining Viscoelastic material from the eye. BSS on a 30-gauge cannula was used to hydrate the wound. An intracameral antibiotic was administered. The wounds were checked and found to be watertight. The lid speculum and drapes were carefully removed. Several drops of Vigamox were placed in the operative eye. The eye was covered with protective eyewear. The patient was taken to the recovery area in good condition. There were no complications.   ____________________________ Lyla Glassing, MD nm:bm D: 11/05/2014 09:38:08 ET T: 11/05/2014 17:48:48 ET JOB#: [id number]  cc: Lyla Glassing, MD, <Dictator> Lyla Glassing MD ELECTRONICALLY SIGNED 11/09/2014 11:21

## 2014-12-27 NOTE — Op Note (Signed)
PATIENT NAME:  Amber Holder, Amber Holder MR#:  [id number] DATE OF BIRTH:  [birth year]  DATE OF PROCEDURE:  10/15/2014  PREOPERATIVE DIAGNOSIS:  Nuclear sclerotic cataract, right eye. Diagnosis code H25.11  POSTOPERATIVE DIAGNOSIS:  Nuclear sclerotic cataract, right eye. Diagnosis code H25.11  PROCEDURE:  Phacoemulsification with posterior chamber intraocular lens right eye, model SN60WF  SURGEON:  Lyla Glassing, MD  INDICATIONS:  This is a 69 year old female with decreased vision in the right eye.  PROCEDURE:  The risks and benefits of cataract surgery were discussed at length with the patient, including bleeding, infection, retinal detachment, re-operation, diplopia, ptosis, loss of vision, and loss of the eye. Informed consent was obtained. On the day of surgery, several sets of preoperative medication were administered to the operative eye including 0.5% tetracaine,1% cyclopentolate, 10% phenylephrine, 0.5% ketorolac, 0.5% gatifloxacin, and 2% lidocaine .  The patient was taken to the operating room and sedated via IV sedation. Topical tetracaine was placed in the eye. The operative eye was prepped using a diluted 10% Betadine solution and then covered in sterile drapes leaving only the operative eye exposed. A Lieberman lid speculum was placed to provide exposure. Using 0.12 forceps and a sideport blade, a paracentesis was created. Then a mixture of BSS, preservative free lidocaine, and epinephrine was injected into the anterior chamber. Next, a 2.4 mm keratome blade was used to create a two-step full-thickness clear corneal incision temporally. The cystitome and Utrata forceps were used to create a continuous capsulorrhexis in the anterior lens capsule. BSS on a hydrodissection cannula was used to perform gentle hydrodissection. Phacoemulsification was then performed to remove the nucleus. Irrigation and aspiration was performed to remove the remaining cortical material. Provisc was injected to fill the  capsular bag and anterior chamber. A 22.5-diopter SN60WF intraocular lens was injected into the capsular bag. The Connor wand was used to rotate it into proper position in the capsular bag. Irrigation and aspiration was performed to remove the remaining Viscoelastic material from the eye. BSS on a 30-gauge cannula was used to hydrate the wound. An intracameral antibiotic was administered. The wounds were checked and found to be watertight. The lid speculum and drapes were carefully removed. Several drops of Vigamox were placed in the operative eye. The eye was covered with protective eyewear. The patient was taken to the recovery area in good condition. There were no complications.   ____________________________ Lyla Glassing, MD nm:mw D: 10/15/2014 10:23:53 ET T: 10/15/2014 11:14:31 ET JOB#: [id number]  cc: Lyla Glassing, MD, <Dictator> Lyla Glassing MD ELECTRONICALLY SIGNED 10/22/2014 7:41

## 2015-01-29 DIAGNOSIS — E782 Mixed hyperlipidemia: Secondary | ICD-10-CM | POA: Diagnosis not present

## 2015-01-29 DIAGNOSIS — Z0001 Encounter for general adult medical examination with abnormal findings: Secondary | ICD-10-CM | POA: Diagnosis not present

## 2015-01-29 DIAGNOSIS — E559 Vitamin D deficiency, unspecified: Secondary | ICD-10-CM | POA: Diagnosis not present

## 2015-01-29 DIAGNOSIS — E1165 Type 2 diabetes mellitus with hyperglycemia: Secondary | ICD-10-CM | POA: Diagnosis not present

## 2015-03-19 DIAGNOSIS — M5117 Intervertebral disc disorders with radiculopathy, lumbosacral region: Secondary | ICD-10-CM | POA: Diagnosis not present

## 2015-03-19 DIAGNOSIS — M810 Age-related osteoporosis without current pathological fracture: Secondary | ICD-10-CM | POA: Diagnosis not present

## 2015-03-19 DIAGNOSIS — M25551 Pain in right hip: Secondary | ICD-10-CM | POA: Diagnosis not present

## 2015-03-19 DIAGNOSIS — I739 Peripheral vascular disease, unspecified: Secondary | ICD-10-CM | POA: Diagnosis not present

## 2015-03-19 DIAGNOSIS — E1165 Type 2 diabetes mellitus with hyperglycemia: Secondary | ICD-10-CM | POA: Diagnosis not present

## 2015-03-23 DIAGNOSIS — E1151 Type 2 diabetes mellitus with diabetic peripheral angiopathy without gangrene: Secondary | ICD-10-CM | POA: Diagnosis not present

## 2015-03-23 DIAGNOSIS — Z9181 History of falling: Secondary | ICD-10-CM | POA: Diagnosis not present

## 2015-03-23 DIAGNOSIS — I8311 Varicose veins of right lower extremity with inflammation: Secondary | ICD-10-CM | POA: Diagnosis not present

## 2015-03-23 DIAGNOSIS — G26 Extrapyramidal and movement disorders in diseases classified elsewhere: Secondary | ICD-10-CM | POA: Diagnosis not present

## 2015-03-23 DIAGNOSIS — H9193 Unspecified hearing loss, bilateral: Secondary | ICD-10-CM | POA: Diagnosis not present

## 2015-03-23 DIAGNOSIS — F17210 Nicotine dependence, cigarettes, uncomplicated: Secondary | ICD-10-CM | POA: Diagnosis not present

## 2015-03-23 DIAGNOSIS — J449 Chronic obstructive pulmonary disease, unspecified: Secondary | ICD-10-CM | POA: Diagnosis not present

## 2015-03-23 DIAGNOSIS — M810 Age-related osteoporosis without current pathological fracture: Secondary | ICD-10-CM | POA: Diagnosis not present

## 2015-03-23 DIAGNOSIS — E1165 Type 2 diabetes mellitus with hyperglycemia: Secondary | ICD-10-CM | POA: Diagnosis not present

## 2015-03-23 DIAGNOSIS — M5417 Radiculopathy, lumbosacral region: Secondary | ICD-10-CM | POA: Diagnosis not present

## 2015-03-23 DIAGNOSIS — M7071 Other bursitis of hip, right hip: Secondary | ICD-10-CM | POA: Diagnosis not present

## 2015-03-30 DIAGNOSIS — G26 Extrapyramidal and movement disorders in diseases classified elsewhere: Secondary | ICD-10-CM | POA: Diagnosis not present

## 2015-03-30 DIAGNOSIS — J449 Chronic obstructive pulmonary disease, unspecified: Secondary | ICD-10-CM | POA: Diagnosis not present

## 2015-03-30 DIAGNOSIS — E1165 Type 2 diabetes mellitus with hyperglycemia: Secondary | ICD-10-CM | POA: Diagnosis not present

## 2015-03-30 DIAGNOSIS — M5417 Radiculopathy, lumbosacral region: Secondary | ICD-10-CM | POA: Diagnosis not present

## 2015-03-30 DIAGNOSIS — E1151 Type 2 diabetes mellitus with diabetic peripheral angiopathy without gangrene: Secondary | ICD-10-CM | POA: Diagnosis not present

## 2015-03-30 DIAGNOSIS — M7071 Other bursitis of hip, right hip: Secondary | ICD-10-CM | POA: Diagnosis not present

## 2015-03-31 DIAGNOSIS — E1151 Type 2 diabetes mellitus with diabetic peripheral angiopathy without gangrene: Secondary | ICD-10-CM | POA: Diagnosis not present

## 2015-03-31 DIAGNOSIS — E1165 Type 2 diabetes mellitus with hyperglycemia: Secondary | ICD-10-CM | POA: Diagnosis not present

## 2015-03-31 DIAGNOSIS — G26 Extrapyramidal and movement disorders in diseases classified elsewhere: Secondary | ICD-10-CM | POA: Diagnosis not present

## 2015-03-31 DIAGNOSIS — M7071 Other bursitis of hip, right hip: Secondary | ICD-10-CM | POA: Diagnosis not present

## 2015-03-31 DIAGNOSIS — M5417 Radiculopathy, lumbosacral region: Secondary | ICD-10-CM | POA: Diagnosis not present

## 2015-03-31 DIAGNOSIS — J449 Chronic obstructive pulmonary disease, unspecified: Secondary | ICD-10-CM | POA: Diagnosis not present

## 2015-04-02 DIAGNOSIS — K580 Irritable bowel syndrome with diarrhea: Secondary | ICD-10-CM | POA: Diagnosis not present

## 2015-04-02 DIAGNOSIS — Z86010 Personal history of colonic polyps: Secondary | ICD-10-CM | POA: Diagnosis not present

## 2015-04-06 DIAGNOSIS — M5417 Radiculopathy, lumbosacral region: Secondary | ICD-10-CM | POA: Diagnosis not present

## 2015-04-06 DIAGNOSIS — E1165 Type 2 diabetes mellitus with hyperglycemia: Secondary | ICD-10-CM | POA: Diagnosis not present

## 2015-04-06 DIAGNOSIS — E1151 Type 2 diabetes mellitus with diabetic peripheral angiopathy without gangrene: Secondary | ICD-10-CM | POA: Diagnosis not present

## 2015-04-06 DIAGNOSIS — M7071 Other bursitis of hip, right hip: Secondary | ICD-10-CM | POA: Diagnosis not present

## 2015-04-06 DIAGNOSIS — J449 Chronic obstructive pulmonary disease, unspecified: Secondary | ICD-10-CM | POA: Diagnosis not present

## 2015-04-06 DIAGNOSIS — G26 Extrapyramidal and movement disorders in diseases classified elsewhere: Secondary | ICD-10-CM | POA: Diagnosis not present

## 2015-04-08 DIAGNOSIS — M7071 Other bursitis of hip, right hip: Secondary | ICD-10-CM | POA: Diagnosis not present

## 2015-04-08 DIAGNOSIS — M5417 Radiculopathy, lumbosacral region: Secondary | ICD-10-CM | POA: Diagnosis not present

## 2015-04-08 DIAGNOSIS — G26 Extrapyramidal and movement disorders in diseases classified elsewhere: Secondary | ICD-10-CM | POA: Diagnosis not present

## 2015-04-08 DIAGNOSIS — E1151 Type 2 diabetes mellitus with diabetic peripheral angiopathy without gangrene: Secondary | ICD-10-CM | POA: Diagnosis not present

## 2015-04-08 DIAGNOSIS — J449 Chronic obstructive pulmonary disease, unspecified: Secondary | ICD-10-CM | POA: Diagnosis not present

## 2015-04-08 DIAGNOSIS — E1165 Type 2 diabetes mellitus with hyperglycemia: Secondary | ICD-10-CM | POA: Diagnosis not present

## 2015-04-13 DIAGNOSIS — J449 Chronic obstructive pulmonary disease, unspecified: Secondary | ICD-10-CM | POA: Diagnosis not present

## 2015-04-13 DIAGNOSIS — E1151 Type 2 diabetes mellitus with diabetic peripheral angiopathy without gangrene: Secondary | ICD-10-CM | POA: Diagnosis not present

## 2015-04-13 DIAGNOSIS — E1165 Type 2 diabetes mellitus with hyperglycemia: Secondary | ICD-10-CM | POA: Diagnosis not present

## 2015-04-13 DIAGNOSIS — G26 Extrapyramidal and movement disorders in diseases classified elsewhere: Secondary | ICD-10-CM | POA: Diagnosis not present

## 2015-04-13 DIAGNOSIS — M7071 Other bursitis of hip, right hip: Secondary | ICD-10-CM | POA: Diagnosis not present

## 2015-04-13 DIAGNOSIS — M5417 Radiculopathy, lumbosacral region: Secondary | ICD-10-CM | POA: Diagnosis not present

## 2015-04-15 DIAGNOSIS — M7071 Other bursitis of hip, right hip: Secondary | ICD-10-CM | POA: Diagnosis not present

## 2015-04-15 DIAGNOSIS — E1151 Type 2 diabetes mellitus with diabetic peripheral angiopathy without gangrene: Secondary | ICD-10-CM | POA: Diagnosis not present

## 2015-04-15 DIAGNOSIS — E1165 Type 2 diabetes mellitus with hyperglycemia: Secondary | ICD-10-CM | POA: Diagnosis not present

## 2015-04-15 DIAGNOSIS — M5417 Radiculopathy, lumbosacral region: Secondary | ICD-10-CM | POA: Diagnosis not present

## 2015-04-15 DIAGNOSIS — J449 Chronic obstructive pulmonary disease, unspecified: Secondary | ICD-10-CM | POA: Diagnosis not present

## 2015-04-15 DIAGNOSIS — G26 Extrapyramidal and movement disorders in diseases classified elsewhere: Secondary | ICD-10-CM | POA: Diagnosis not present

## 2015-04-21 DIAGNOSIS — Z79899 Other long term (current) drug therapy: Secondary | ICD-10-CM | POA: Diagnosis not present

## 2015-04-21 DIAGNOSIS — Z09 Encounter for follow-up examination after completed treatment for conditions other than malignant neoplasm: Secondary | ICD-10-CM | POA: Diagnosis not present

## 2015-04-21 DIAGNOSIS — Z87442 Personal history of urinary calculi: Secondary | ICD-10-CM | POA: Diagnosis not present

## 2015-04-21 DIAGNOSIS — Q615 Medullary cystic kidney: Secondary | ICD-10-CM | POA: Diagnosis not present

## 2015-04-21 DIAGNOSIS — Z87440 Personal history of urinary (tract) infections: Secondary | ICD-10-CM | POA: Diagnosis not present

## 2015-04-21 DIAGNOSIS — N3020 Other chronic cystitis without hematuria: Secondary | ICD-10-CM | POA: Diagnosis not present

## 2015-04-21 DIAGNOSIS — R339 Retention of urine, unspecified: Secondary | ICD-10-CM | POA: Diagnosis not present

## 2015-04-21 DIAGNOSIS — M810 Age-related osteoporosis without current pathological fracture: Secondary | ICD-10-CM | POA: Diagnosis not present

## 2015-04-21 DIAGNOSIS — N200 Calculus of kidney: Secondary | ICD-10-CM | POA: Diagnosis not present

## 2015-04-21 DIAGNOSIS — E119 Type 2 diabetes mellitus without complications: Secondary | ICD-10-CM | POA: Diagnosis not present

## 2015-04-21 DIAGNOSIS — R140 Abdominal distension (gaseous): Secondary | ICD-10-CM | POA: Diagnosis not present

## 2015-04-21 DIAGNOSIS — M2578 Osteophyte, vertebrae: Secondary | ICD-10-CM | POA: Diagnosis not present

## 2015-04-21 DIAGNOSIS — Z6826 Body mass index (BMI) 26.0-26.9, adult: Secondary | ICD-10-CM | POA: Diagnosis not present

## 2015-04-21 DIAGNOSIS — N3946 Mixed incontinence: Secondary | ICD-10-CM | POA: Diagnosis not present

## 2015-04-21 DIAGNOSIS — R600 Localized edema: Secondary | ICD-10-CM | POA: Diagnosis not present

## 2015-04-29 DIAGNOSIS — M25551 Pain in right hip: Secondary | ICD-10-CM | POA: Diagnosis not present

## 2015-04-29 DIAGNOSIS — G8929 Other chronic pain: Secondary | ICD-10-CM | POA: Diagnosis not present

## 2015-04-29 DIAGNOSIS — M1611 Unilateral primary osteoarthritis, right hip: Secondary | ICD-10-CM | POA: Diagnosis not present

## 2015-04-29 DIAGNOSIS — M79604 Pain in right leg: Secondary | ICD-10-CM | POA: Diagnosis not present

## 2015-05-27 ENCOUNTER — Encounter: Payer: Self-pay | Admitting: *Deleted

## 2015-05-27 MED ORDER — SODIUM CHLORIDE 0.9 % IV SOLN
INTRAVENOUS | Status: DC
  Administered 2015-05-28: 14:00:00 via INTRAVENOUS
  Administered 2015-05-28: 1000 mL via INTRAVENOUS

## 2015-05-28 ENCOUNTER — Encounter
Admission: RE | Disposition: A | Discharge status: Home / Self Care | Payer: Self-pay | Source: Ambulatory Visit | Attending: Gastroenterology

## 2015-05-28 ENCOUNTER — Encounter: Payer: Self-pay | Admitting: *Deleted

## 2015-05-28 ENCOUNTER — Ambulatory Visit: Payer: Medicare Other | Admitting: Anesthesiology

## 2015-05-28 ENCOUNTER — Ambulatory Visit
Admission: RE | Admit: 2015-05-28 | Discharge: 2015-05-28 | Disposition: A | Discharge status: Home / Self Care | Payer: Medicare Other | Source: Ambulatory Visit | Attending: Gastroenterology | Admitting: Gastroenterology

## 2015-05-28 DIAGNOSIS — R197 Diarrhea, unspecified: Secondary | ICD-10-CM | POA: Diagnosis not present

## 2015-05-28 DIAGNOSIS — Z79899 Other long term (current) drug therapy: Secondary | ICD-10-CM | POA: Insufficient documentation

## 2015-05-28 DIAGNOSIS — K5790 Diverticulosis of intestine, part unspecified, without perforation or abscess without bleeding: Secondary | ICD-10-CM | POA: Diagnosis not present

## 2015-05-28 DIAGNOSIS — G4730 Sleep apnea, unspecified: Secondary | ICD-10-CM | POA: Insufficient documentation

## 2015-05-28 DIAGNOSIS — J45909 Unspecified asthma, uncomplicated: Secondary | ICD-10-CM | POA: Insufficient documentation

## 2015-05-28 DIAGNOSIS — I878 Other specified disorders of veins: Secondary | ICD-10-CM | POA: Diagnosis not present

## 2015-05-28 DIAGNOSIS — Z882 Allergy status to sulfonamides status: Secondary | ICD-10-CM | POA: Diagnosis not present

## 2015-05-28 DIAGNOSIS — M810 Age-related osteoporosis without current pathological fracture: Secondary | ICD-10-CM | POA: Insufficient documentation

## 2015-05-28 DIAGNOSIS — K219 Gastro-esophageal reflux disease without esophagitis: Secondary | ICD-10-CM | POA: Insufficient documentation

## 2015-05-28 DIAGNOSIS — J449 Chronic obstructive pulmonary disease, unspecified: Secondary | ICD-10-CM | POA: Insufficient documentation

## 2015-05-28 DIAGNOSIS — Z86010 Personal history of colonic polyps: Secondary | ICD-10-CM | POA: Diagnosis present

## 2015-05-28 DIAGNOSIS — D125 Benign neoplasm of sigmoid colon: Secondary | ICD-10-CM | POA: Diagnosis not present

## 2015-05-28 DIAGNOSIS — E119 Type 2 diabetes mellitus without complications: Secondary | ICD-10-CM | POA: Insufficient documentation

## 2015-05-28 DIAGNOSIS — R1310 Dysphagia, unspecified: Secondary | ICD-10-CM | POA: Diagnosis not present

## 2015-05-28 DIAGNOSIS — Z885 Allergy status to narcotic agent status: Secondary | ICD-10-CM | POA: Diagnosis not present

## 2015-05-28 DIAGNOSIS — K5730 Diverticulosis of large intestine without perforation or abscess without bleeding: Secondary | ICD-10-CM | POA: Insufficient documentation

## 2015-05-28 DIAGNOSIS — K635 Polyp of colon: Secondary | ICD-10-CM | POA: Diagnosis not present

## 2015-05-28 DIAGNOSIS — F17210 Nicotine dependence, cigarettes, uncomplicated: Secondary | ICD-10-CM | POA: Diagnosis not present

## 2015-05-28 HISTORY — PX: COLONOSCOPY WITH PROPOFOL: SHX5780

## 2015-05-28 HISTORY — DX: Unspecified asthma, uncomplicated: J45.909

## 2015-05-28 HISTORY — DX: Anxiety disorder, unspecified: F41.9

## 2015-05-28 HISTORY — DX: Type 2 diabetes mellitus without complications: E11.9

## 2015-05-28 HISTORY — DX: Age-related osteoporosis without current pathological fracture: M81.0

## 2015-05-28 HISTORY — DX: Sleep apnea, unspecified: G47.30

## 2015-05-28 HISTORY — DX: Other specified disorders of veins: I87.8

## 2015-05-28 HISTORY — DX: Chronic obstructive pulmonary disease, unspecified: J44.9

## 2015-05-28 LAB — GLUCOSE, CAPILLARY: Glucose-Capillary: 93 mg/dL (ref 65–99)

## 2015-05-28 SURGERY — COLONOSCOPY WITH PROPOFOL
Anesthesia: General

## 2015-05-28 MED ORDER — PROPOFOL 10 MG/ML IV BOLUS
INTRAVENOUS | Status: DC | PRN
  Administered 2015-05-28: 50 mg via INTRAVENOUS

## 2015-05-28 MED ORDER — PROPOFOL 500 MG/50ML IV EMUL
INTRAVENOUS | Status: DC | PRN
  Administered 2015-05-28: 120 ug/kg/min via INTRAVENOUS

## 2015-05-28 NOTE — Transfer of Care (Signed)
Immediate Anesthesia Transfer of Care Note  Patient: Amber Holder  Procedure(s) Performed: Procedure(s): COLONOSCOPY WITH PROPOFOL (N/A)  Patient Location: PACU  Anesthesia Type:General  Level of Consciousness: sedated  Airway & Oxygen Therapy: Patient Spontanous Breathing and Patient connected to nasal cannula oxygen  Post-op Assessment: Report given to RN and Post -op Vital signs reviewed and stable  Post vital signs: Reviewed  Last Vitals:  Filed Vitals:   05/28/15 1349  BP: 119/64  Pulse: 78  Temp: 36.5 C  Resp: 18    Complications: No apparent anesthesia complications

## 2015-05-28 NOTE — Anesthesia Preprocedure Evaluation (Signed)
Anesthesia Evaluation  Patient identified by MRN, date of birth, ID band Patient awake    Reviewed: Allergy & Precautions, H&P , NPO status , Patient's Chart, lab work & pertinent test results, reviewed documented beta blocker date and time   History of Anesthesia Complications Negative for: history of anesthetic complications  Airway Mallampati: II  TM Distance: >3 FB Neck ROM: full    Dental no notable dental hx. (+) Teeth Intact   Pulmonary neg shortness of breath, asthma , sleep apnea and Continuous Positive Airway Pressure Ventilation , COPD,  COPD inhaler, neg recent URI, Current Smoker,    Pulmonary exam normal breath sounds clear to auscultation       Cardiovascular Exercise Tolerance: Good negative cardio ROS Normal cardiovascular exam Rhythm:regular Rate:Normal     Neuro/Psych negative neurological ROS  negative psych ROS   GI/Hepatic negative GI ROS, Neg liver ROS,   Endo/Other  diabetes  Renal/GU Renal disease (kidney stones)  negative genitourinary   Musculoskeletal   Abdominal   Peds  Hematology negative hematology ROS (+)   Anesthesia Other Findings Past Medical History:   Asthma                                                       COPD (chronic obstructive pulmonary disease)                 Diabetes mellitus without complication                       Sleep apnea                                                  Venous stasis                                                Osteoporosis                                                 Reproductive/Obstetrics negative OB ROS                             Anesthesia Physical Anesthesia Plan  ASA: III  Anesthesia Plan: General   Post-op Pain Management:    Induction:   Airway Management Planned:   Additional Equipment:   Intra-op Plan:   Post-operative Plan:   Informed Consent: I have reviewed the patients  History and Physical, chart, labs and discussed the procedure including the risks, benefits and alternatives for the proposed anesthesia with the patient or authorized representative who has indicated his/her understanding and acceptance.   Dental Advisory Given  Plan Discussed with: Anesthesiologist, CRNA and Surgeon  Anesthesia Plan Comments:         Anesthesia Quick Evaluation

## 2015-05-28 NOTE — Op Note (Signed)
[facility] Patient Name: Amber Holder Procedure Date: 05/28/2015 2:18 PM MRN: [id number] Account #: [id number] Date of Birth: [birth year] Admit Type: Outpatient Age: 69 Room: [facility] ENDO ROOM 3 Gender: Female Note Status: Finalized Procedure:         Colonoscopy Indications:       Personal history of colonic polyps Providers:         Lollie Sails, MD Referring MD:      Lavera Guise, MD (Referring MD) Medicines:         Monitored Anesthesia Care Complications:     No immediate complications. Procedure:         Pre-Anesthesia Assessment:                    - ASA Grade Assessment: III - A patient with severe                     systemic disease.                    After obtaining informed consent, the colonoscope was                     passed under direct vision. Throughout the procedure, the                     patient's blood pressure, pulse, and oxygen saturations                     were monitored continuously. The Colonoscope was                     introduced through the anus and advanced to the the cecum,                     identified by appendiceal orifice and ileocecal valve. The                     colonoscopy was performed with moderate difficulty due to                     a tortuous colon. Successful completion of the procedure                     was aided by changing the patient to a supine position. Findings:      A 3 mm polyp was found in the distal sigmoid colon. The polyp was       sessile. The polyp was removed with a cold biopsy forceps. Resection and       retrieval were complete.      A 10 mm polyp was found in the distal sigmoid colon. The polyp was       pedunculated. The polyp was removed with a hot snare. Resection and       retrieval were complete. Area was tattooed with an injection of 2 mL of       [place] ink.      A few small-mouthed diverticula were found in the sigmoid colon and in       the distal  descending colon.      The digital rectal exam was normal.      The retroflexed view of the distal rectum and anal verge was normal and       showed no anal or rectal abnormalities. Impression:        -  One 3 mm polyp in the distal sigmoid colon. Resected and                     retrieved.                    - One 10 mm polyp in the distal sigmoid colon. Resected                     and retrieved. Tattooed.                    - Diverticulosis in the sigmoid colon and in the distal                     descending colon.                    - The distal rectum and anal verge are normal on                     retroflexion view. Recommendation:    - Discharge patient to home.                    - Telephone GI clinic for pathology results in 1 week. Procedure Code(s): --- Professional ---                    [phone], Colonoscopy, flexible; with removal of tumor(s),                     polyp(s), or other lesion(s) by snare technique                    45380, 30, Colonoscopy, flexible; with biopsy, single or                     multiple                    45381, Colonoscopy, flexible; with directed submucosal                     injection(s), any substance Diagnosis Code(s): --- Professional ---                    211.3, Benign neoplasm of colon                    V12.72, Personal history of colonic polyps                    562.10, Diverticulosis of colon (without mention of                     hemorrhage) CPT copyright 2014 [employer]. All rights reserved. The codes documented in this report are preliminary and upon coder review may  be revised to meet current compliance requirements. Lollie Sails, MD 05/28/2015 2:56:01 PM This report has been signed electronically. Number of Addenda: 0 Note Initiated On: 05/28/2015 2:18 PM Scope Withdrawal Time: 0 hours 19 minutes 45 seconds  Total Procedure Duration: 0 hours 29 minutes 41 seconds       [facility]

## 2015-05-28 NOTE — H&P (Signed)
Outpatient short stay form Pre-procedure 05/28/2015 1:47 PM Lollie Sails MD   Primary Physician: Dr. Clayborn Bigness  Reason for visit:  Colonoscopy  History of present illness:  Patient is a 69 year old female with a personal history of colon polyps presenting today for a colonoscopy. She takes no anticoagulation medications or aspirin. She tolerated her prep well.      Current facility-administered medications:  .  0.9 %  sodium chloride infusion, , Intravenous, Continuous, Lollie Sails, MD  Prescriptions prior to admission  Medication Sig Dispense Refill Last Dose  . albuterol (PROVENTIL HFA;VENTOLIN HFA) 108 (90 BASE) MCG/ACT inhaler Inhale 2 puffs into the lungs every 6 (six) hours as needed for wheezing or shortness of breath.     . cholecalciferol (VITAMIN D) 400 UNITS TABS tablet Take 400 Units by mouth.     . furosemide (LASIX) 20 MG tablet Take 20 mg by mouth.     . metFORMIN (GLUCOPHAGE) 500 MG tablet Take by mouth 2 (two) times daily with a meal.     . montelukast (SINGULAIR) 10 MG tablet Take 10 mg by mouth at bedtime.     Marland Kitchen omega-3 acid ethyl esters (LOVAZA) 1 G capsule Take by mouth daily.     . potassium chloride (KLOR-CON) 8 MEQ tablet Take 8 mEq by mouth daily.     Marland Kitchen tiotropium (SPIRIVA) 18 MCG inhalation capsule Place 18 mcg into inhaler and inhale daily.   05/28/2015 at 0800     Allergies  Allergen Reactions  . Percocet [Oxycodone-Acetaminophen]   . Sulfa Antibiotics      Past Medical History  Diagnosis Date  . Asthma   . Anxiety   . COPD (chronic obstructive pulmonary disease)   . Diabetes mellitus without complication   . Sleep apnea   . Venous stasis   . Osteoporosis     Review of systems:      Physical Exam    Heart and lungs: Regular rate and rhythm without rub or gallop, lungs are bilaterally clear    HEENT: Normocephalic atraumatic eyes are anicteric    Other:     Pertinant exam for procedure: Soft nontender nondistended bowel  sounds positive and normoactive    Planned proceedures: Colonoscopy and indicated procedures. I have discussed the risks benefits and complications of procedures to include not limited to bleeding, infection, perforation and the risk of sedation and the patient wishes to proceed.  Lollie Sails, MD Gastroenterology 05/28/2015  1:47 PM

## 2015-06-01 LAB — SURGICAL PATHOLOGY

## 2015-06-03 NOTE — Anesthesia Postprocedure Evaluation (Signed)
  Anesthesia Post-op Note  Patient: Amber Holder  Procedure(s) Performed: Procedure(s): COLONOSCOPY WITH PROPOFOL (N/A)  Anesthesia type:General  Patient location: PACU  Post pain: Pain level controlled  Post assessment: Post-op Vital signs reviewed, Patient's Cardiovascular Status Stable, Respiratory Function Stable, Patent Airway and No signs of Nausea or vomiting  Post vital signs: Reviewed and stable  Last Vitals:  Filed Vitals:   05/28/15 1528  BP: 110/67  Pulse: 69  Temp:   Resp: 13    Level of consciousness: awake, alert  and patient cooperative  Complications: No apparent anesthesia complications

## 2015-06-18 ENCOUNTER — Encounter: Payer: Self-pay | Admitting: Gastroenterology

## 2015-06-24 DIAGNOSIS — H02052 Trichiasis without entropian right lower eyelid: Secondary | ICD-10-CM | POA: Diagnosis not present

## 2015-06-24 DIAGNOSIS — H02403 Unspecified ptosis of bilateral eyelids: Secondary | ICD-10-CM | POA: Diagnosis not present

## 2015-06-25 DIAGNOSIS — M1611 Unilateral primary osteoarthritis, right hip: Secondary | ICD-10-CM | POA: Diagnosis not present

## 2015-07-20 DIAGNOSIS — H109 Unspecified conjunctivitis: Secondary | ICD-10-CM | POA: Diagnosis not present

## 2015-07-20 DIAGNOSIS — E119 Type 2 diabetes mellitus without complications: Secondary | ICD-10-CM | POA: Diagnosis not present

## 2015-07-20 DIAGNOSIS — M810 Age-related osteoporosis without current pathological fracture: Secondary | ICD-10-CM | POA: Diagnosis not present

## 2015-07-20 DIAGNOSIS — F17210 Nicotine dependence, cigarettes, uncomplicated: Secondary | ICD-10-CM | POA: Diagnosis not present

## 2015-07-20 DIAGNOSIS — J449 Chronic obstructive pulmonary disease, unspecified: Secondary | ICD-10-CM | POA: Diagnosis not present

## 2015-07-27 DIAGNOSIS — Z23 Encounter for immunization: Secondary | ICD-10-CM | POA: Diagnosis not present

## 2015-07-27 DIAGNOSIS — M1611 Unilateral primary osteoarthritis, right hip: Secondary | ICD-10-CM | POA: Diagnosis not present

## 2015-09-30 DIAGNOSIS — H02055 Trichiasis without entropian left lower eyelid: Secondary | ICD-10-CM | POA: Diagnosis not present

## 2015-09-30 DIAGNOSIS — H02403 Unspecified ptosis of bilateral eyelids: Secondary | ICD-10-CM | POA: Diagnosis not present

## 2015-09-30 DIAGNOSIS — H02052 Trichiasis without entropian right lower eyelid: Secondary | ICD-10-CM | POA: Diagnosis not present

## 2015-10-15 DIAGNOSIS — R5383 Other fatigue: Secondary | ICD-10-CM | POA: Diagnosis not present

## 2015-10-15 DIAGNOSIS — D509 Iron deficiency anemia, unspecified: Secondary | ICD-10-CM | POA: Diagnosis not present

## 2015-10-21 DIAGNOSIS — F17210 Nicotine dependence, cigarettes, uncomplicated: Secondary | ICD-10-CM | POA: Diagnosis not present

## 2015-10-21 DIAGNOSIS — N3946 Mixed incontinence: Secondary | ICD-10-CM | POA: Diagnosis not present

## 2015-10-21 DIAGNOSIS — E1165 Type 2 diabetes mellitus with hyperglycemia: Secondary | ICD-10-CM | POA: Diagnosis not present

## 2015-10-21 DIAGNOSIS — I8312 Varicose veins of left lower extremity with inflammation: Secondary | ICD-10-CM | POA: Diagnosis not present

## 2015-10-21 DIAGNOSIS — H9193 Unspecified hearing loss, bilateral: Secondary | ICD-10-CM | POA: Diagnosis not present

## 2015-10-21 DIAGNOSIS — J441 Chronic obstructive pulmonary disease with (acute) exacerbation: Secondary | ICD-10-CM | POA: Diagnosis not present

## 2015-11-10 DIAGNOSIS — H903 Sensorineural hearing loss, bilateral: Secondary | ICD-10-CM | POA: Diagnosis not present

## 2015-12-28 DIAGNOSIS — Z872 Personal history of diseases of the skin and subcutaneous tissue: Secondary | ICD-10-CM | POA: Diagnosis not present

## 2015-12-28 DIAGNOSIS — L821 Other seborrheic keratosis: Secondary | ICD-10-CM | POA: Diagnosis not present

## 2015-12-28 DIAGNOSIS — Z1283 Encounter for screening for malignant neoplasm of skin: Secondary | ICD-10-CM | POA: Diagnosis not present

## 2015-12-28 DIAGNOSIS — D485 Neoplasm of uncertain behavior of skin: Secondary | ICD-10-CM | POA: Diagnosis not present

## 2016-01-11 DIAGNOSIS — M1611 Unilateral primary osteoarthritis, right hip: Secondary | ICD-10-CM | POA: Diagnosis not present

## 2016-01-12 DIAGNOSIS — M25562 Pain in left knee: Secondary | ICD-10-CM | POA: Diagnosis not present

## 2016-01-12 DIAGNOSIS — M1732 Unilateral post-traumatic osteoarthritis, left knee: Secondary | ICD-10-CM | POA: Diagnosis not present

## 2016-01-14 DIAGNOSIS — Z0001 Encounter for general adult medical examination with abnormal findings: Secondary | ICD-10-CM | POA: Diagnosis not present

## 2016-01-14 DIAGNOSIS — F17210 Nicotine dependence, cigarettes, uncomplicated: Secondary | ICD-10-CM | POA: Diagnosis not present

## 2016-01-14 DIAGNOSIS — J449 Chronic obstructive pulmonary disease, unspecified: Secondary | ICD-10-CM | POA: Diagnosis not present

## 2016-01-14 DIAGNOSIS — E119 Type 2 diabetes mellitus without complications: Secondary | ICD-10-CM | POA: Diagnosis not present

## 2016-01-14 DIAGNOSIS — N390 Urinary tract infection, site not specified: Secondary | ICD-10-CM | POA: Diagnosis not present

## 2016-01-14 DIAGNOSIS — M810 Age-related osteoporosis without current pathological fracture: Secondary | ICD-10-CM | POA: Diagnosis not present

## 2016-01-14 DIAGNOSIS — J309 Allergic rhinitis, unspecified: Secondary | ICD-10-CM | POA: Diagnosis not present

## 2016-01-17 ENCOUNTER — Other Ambulatory Visit: Payer: Self-pay | Admitting: Nurse Practitioner

## 2016-01-17 DIAGNOSIS — Z1231 Encounter for screening mammogram for malignant neoplasm of breast: Secondary | ICD-10-CM

## 2016-03-17 DIAGNOSIS — M25551 Pain in right hip: Secondary | ICD-10-CM | POA: Diagnosis not present

## 2016-03-17 DIAGNOSIS — M1611 Unilateral primary osteoarthritis, right hip: Secondary | ICD-10-CM | POA: Diagnosis not present

## 2016-05-02 DIAGNOSIS — M1611 Unilateral primary osteoarthritis, right hip: Secondary | ICD-10-CM | POA: Diagnosis not present

## 2016-05-03 ENCOUNTER — Ambulatory Visit
Admission: RE | Admit: 2016-05-03 | Discharge: 2016-05-03 | Disposition: A | Discharge status: Home / Self Care | Payer: Medicare Other | Source: Ambulatory Visit | Attending: Nurse Practitioner | Admitting: Nurse Practitioner

## 2016-05-03 ENCOUNTER — Other Ambulatory Visit: Payer: Self-pay | Admitting: Nurse Practitioner

## 2016-05-03 DIAGNOSIS — Z1231 Encounter for screening mammogram for malignant neoplasm of breast: Secondary | ICD-10-CM | POA: Diagnosis not present

## 2016-05-10 DIAGNOSIS — Q615 Medullary cystic kidney: Secondary | ICD-10-CM | POA: Diagnosis not present

## 2016-05-10 DIAGNOSIS — J9811 Atelectasis: Secondary | ICD-10-CM | POA: Diagnosis not present

## 2016-05-10 DIAGNOSIS — N200 Calculus of kidney: Secondary | ICD-10-CM | POA: Diagnosis not present

## 2016-05-10 DIAGNOSIS — R350 Frequency of micturition: Secondary | ICD-10-CM | POA: Diagnosis not present

## 2016-05-10 DIAGNOSIS — Z6826 Body mass index (BMI) 26.0-26.9, adult: Secondary | ICD-10-CM | POA: Diagnosis not present

## 2016-05-10 DIAGNOSIS — N3020 Other chronic cystitis without hematuria: Secondary | ICD-10-CM | POA: Diagnosis not present

## 2016-05-10 DIAGNOSIS — R140 Abdominal distension (gaseous): Secondary | ICD-10-CM | POA: Diagnosis not present

## 2016-05-18 DIAGNOSIS — E559 Vitamin D deficiency, unspecified: Secondary | ICD-10-CM | POA: Diagnosis not present

## 2016-05-18 DIAGNOSIS — E1165 Type 2 diabetes mellitus with hyperglycemia: Secondary | ICD-10-CM | POA: Diagnosis not present

## 2016-05-18 DIAGNOSIS — Z0001 Encounter for general adult medical examination with abnormal findings: Secondary | ICD-10-CM | POA: Diagnosis not present

## 2016-05-18 DIAGNOSIS — I10 Essential (primary) hypertension: Secondary | ICD-10-CM | POA: Diagnosis not present

## 2016-05-18 DIAGNOSIS — E782 Mixed hyperlipidemia: Secondary | ICD-10-CM | POA: Diagnosis not present

## 2016-05-19 DIAGNOSIS — Z23 Encounter for immunization: Secondary | ICD-10-CM | POA: Diagnosis not present

## 2016-05-19 DIAGNOSIS — M25551 Pain in right hip: Secondary | ICD-10-CM | POA: Diagnosis not present

## 2016-05-19 DIAGNOSIS — E119 Type 2 diabetes mellitus without complications: Secondary | ICD-10-CM | POA: Diagnosis not present

## 2016-05-19 DIAGNOSIS — F17210 Nicotine dependence, cigarettes, uncomplicated: Secondary | ICD-10-CM | POA: Diagnosis not present

## 2016-05-19 DIAGNOSIS — J449 Chronic obstructive pulmonary disease, unspecified: Secondary | ICD-10-CM | POA: Diagnosis not present

## 2016-05-19 DIAGNOSIS — M545 Low back pain: Secondary | ICD-10-CM | POA: Diagnosis not present

## 2016-05-31 ENCOUNTER — Encounter
Admission: RE | Admit: 2016-05-31 | Discharge: 2016-05-31 | Disposition: A | Discharge status: Home / Self Care | Payer: Medicare Other | Source: Ambulatory Visit | Attending: Orthopedic Surgery | Admitting: Orthopedic Surgery

## 2016-05-31 DIAGNOSIS — Z01818 Encounter for other preprocedural examination: Secondary | ICD-10-CM | POA: Insufficient documentation

## 2016-05-31 DIAGNOSIS — E119 Type 2 diabetes mellitus without complications: Secondary | ICD-10-CM | POA: Insufficient documentation

## 2016-05-31 HISTORY — DX: Chronic kidney disease, unspecified: N18.9

## 2016-05-31 HISTORY — DX: Unspecified osteoarthritis, unspecified site: M19.90

## 2016-05-31 HISTORY — DX: Gastro-esophageal reflux disease without esophagitis: K21.9

## 2016-05-31 HISTORY — DX: Unspecified hearing loss, unspecified ear: H91.90

## 2016-05-31 LAB — COMPREHENSIVE METABOLIC PANEL
ALBUMIN: 4 g/dL (ref 3.5–5.0)
ALT: 13 U/L — ABNORMAL LOW (ref 14–54)
ANION GAP: 7 (ref 5–15)
AST: 15 U/L (ref 15–41)
Alkaline Phosphatase: 57 U/L (ref 38–126)
BUN: 22 mg/dL — ABNORMAL HIGH (ref 6–20)
CO2: 29 mmol/L (ref 22–32)
Calcium: 9.5 mg/dL (ref 8.9–10.3)
Chloride: 106 mmol/L (ref 101–111)
Creatinine, Ser: 0.89 mg/dL (ref 0.44–1.00)
GFR calc non Af Amer: >60 mL/min (ref >60)
GLUCOSE: 95 mg/dL (ref 65–99)
POTASSIUM: 4 mmol/L (ref 3.5–5.1)
Sodium: 142 mmol/L (ref 135–145)
Total Bilirubin: 0.6 mg/dL (ref 0.3–1.2)
Total Protein: 7.1 g/dL (ref 6.5–8.1)

## 2016-05-31 LAB — TYPE AND SCREEN
ABO/RH(D): A POS
ANTIBODY SCREEN: NEGATIVE

## 2016-05-31 LAB — CBC
HCT: 42.5 % (ref 35.0–47.0)
HEMOGLOBIN: 14.6 g/dL (ref 12.0–16.0)
MCH: 30.6 pg (ref 26.0–34.0)
MCHC: 34.3 g/dL (ref 32.0–36.0)
MCV: 89.3 fL (ref 80.0–100.0)
PLATELETS: 208 10*3/uL (ref 150–440)
RBC: 4.76 MIL/uL (ref 3.80–5.20)
RDW: 13.8 % (ref 11.5–14.5)
WBC: 7.7 10*3/uL (ref 3.6–11.0)

## 2016-05-31 LAB — SEDIMENTATION RATE: SED RATE: 7 mm/h (ref 0–30)

## 2016-05-31 LAB — URINALYSIS COMPLETE WITH MICROSCOPIC (ARMC ONLY)
Bilirubin Urine: NEGATIVE
Glucose, UA: NEGATIVE mg/dL
Hgb urine dipstick: NEGATIVE
KETONES UR: NEGATIVE mg/dL
Leukocytes, UA: NEGATIVE
Nitrite: NEGATIVE
PROTEIN: NEGATIVE mg/dL
SPECIFIC GRAVITY, URINE: 1.017 (ref 1.005–1.030)
Squamous Epithelial / LPF: NONE SEEN
pH: 6 (ref 5.0–8.0)

## 2016-05-31 LAB — APTT: APTT: 31 s (ref 24–36)

## 2016-05-31 LAB — SURGICAL PCR SCREEN
MRSA, PCR: NEGATIVE
STAPHYLOCOCCUS AUREUS: NEGATIVE

## 2016-05-31 LAB — PROTIME-INR
INR: 0.96
Prothrombin Time: 12.8 seconds (ref 11.4–15.2)

## 2016-05-31 NOTE — Pre-Procedure Instructions (Signed)
Pre-op EKG called to Dr. Dorian Furnace medical clearance requested.

## 2016-05-31 NOTE — Pre-Procedure Instructions (Signed)
Medical clearance request called and faxed to Dr. Marry Guan office (spoke to [name] Counts).

## 2016-05-31 NOTE — Patient Instructions (Signed)
  Your procedure is scheduled on June 14, 2016 (Wednesday) Report to Same Day Surgery 2nd floor medical mall To find out your arrival time please call [phone] between 1PM - 3PM on June 13, 2016 (Tuesday)  Remember: Instructions that are not followed completely may result in serious medical risk, up to and including death, or upon the discretion of your surgeon and anesthesiologist your surgery may need to be rescheduled.    _x___ 1. Do not eat food or drink liquids after midnight. No gum chewing or hard candies.     __x__ 2. No Alcohol for 24 hours before or after surgery.   __x__3. No Smoking for 24 prior to surgery.   ____  4. Bring all medications with you on the day of surgery if instructed.    __x__ 5. Notify your doctor if there is any change in your medical condition     (cold, fever, infections).     Do not wear jewelry, make-up, hairpins, clips or nail polish.  Do not wear lotions, powders, or perfumes. You may wear deodorant.  Do not shave 48 hours prior to surgery. Men may shave face and neck.  Do not bring valuables to the hospital.    [facility] is not responsible for any belongings or valuables.               Contacts, dentures or bridgework may not be worn into surgery.  Leave your suitcase in the car. After surgery it may be brought to your room.  For patients admitted to the hospital, discharge time is determined by your treatment team.   Patients discharged the day of surgery will not be allowed to drive home.    Please read over the following fact sheets that you were given:   [facility] Preparing for Surgery and or MRSA Information   ____ Take these medicines the morning of surgery with A SIP OF WATER:    1.   2.  3.  4.  5.  6.  ____Fleets enema or Magnesium Citrate as directed.   _x___ Use CHG Soap or sage wipes as directed on instruction sheet   _x___ Use inhalers on the day of surgery and bring to hospital day of surgery (Use Spiriva  and Pro Air inhaler and bring to hospital)  _x___ Stop metformin 2 days prior to surgery (Stop Metformin on October 16)    ____ Take 1/2 of usual insulin dose the night before surgery and none on the morning of           surgery.   _x___ Stop aspirin or coumadin, or plavix (NO ASPIRIN)  x__ Stop Anti-inflammatories such as Advil, Aleve, Ibuprofen, Motrin, Naproxen,          Naprosyn, Goodies powders or aspirin products.  (Stop Meloxicam on October 11 per ortho physician) Madaline Brilliant to take Tylenol.   ____ Stop supplements until after surgery.    _x___ Bring C-Pap to the hospital.

## 2016-06-01 LAB — HEMOGLOBIN A1C
Hgb A1c MFr Bld: 5.9 % — ABNORMAL HIGH (ref 4.8–5.6)
MEAN PLASMA GLUCOSE: 123 mg/dL

## 2016-06-02 LAB — URINE CULTURE
Culture: >=100000 — AB
SPECIAL REQUESTS: NORMAL

## 2016-06-02 NOTE — Pre-Procedure Instructions (Signed)
Urine culture results sent to Dr. Hooten for review. 

## 2016-06-06 NOTE — Pre-Procedure Instructions (Signed)
[place] MODERATE RISK 06/05/16

## 2016-06-14 ENCOUNTER — Inpatient Hospital Stay
Admission: RE | Admit: 2016-06-14 | Discharge: 2016-06-17 | DRG: 470 | Disposition: A | Discharge status: Skilled Nursing Facility | Payer: Medicare Other | Source: Ambulatory Visit | Attending: Orthopedic Surgery | Admitting: Orthopedic Surgery

## 2016-06-14 ENCOUNTER — Inpatient Hospital Stay: Payer: Medicare Other

## 2016-06-14 ENCOUNTER — Encounter
Admission: RE | Disposition: A | Discharge status: Skilled Nursing Facility | Payer: Self-pay | Source: Ambulatory Visit | Attending: Orthopedic Surgery

## 2016-06-14 ENCOUNTER — Inpatient Hospital Stay: Payer: Medicare Other | Admitting: Anesthesiology

## 2016-06-14 ENCOUNTER — Encounter: Payer: Self-pay | Admitting: *Deleted

## 2016-06-14 DIAGNOSIS — F419 Anxiety disorder, unspecified: Secondary | ICD-10-CM | POA: Diagnosis present

## 2016-06-14 DIAGNOSIS — K5790 Diverticulosis of intestine, part unspecified, without perforation or abscess without bleeding: Secondary | ICD-10-CM | POA: Diagnosis present

## 2016-06-14 DIAGNOSIS — Z791 Long term (current) use of non-steroidal anti-inflammatories (NSAID): Secondary | ICD-10-CM | POA: Diagnosis not present

## 2016-06-14 DIAGNOSIS — Z7951 Long term (current) use of inhaled steroids: Secondary | ICD-10-CM | POA: Diagnosis not present

## 2016-06-14 DIAGNOSIS — F17200 Nicotine dependence, unspecified, uncomplicated: Secondary | ICD-10-CM | POA: Diagnosis present

## 2016-06-14 DIAGNOSIS — R262 Difficulty in walking, not elsewhere classified: Secondary | ICD-10-CM

## 2016-06-14 DIAGNOSIS — I878 Other specified disorders of veins: Secondary | ICD-10-CM | POA: Diagnosis present

## 2016-06-14 DIAGNOSIS — E119 Type 2 diabetes mellitus without complications: Secondary | ICD-10-CM | POA: Diagnosis present

## 2016-06-14 DIAGNOSIS — Z9842 Cataract extraction status, left eye: Secondary | ICD-10-CM

## 2016-06-14 DIAGNOSIS — Z79899 Other long term (current) drug therapy: Secondary | ICD-10-CM | POA: Diagnosis not present

## 2016-06-14 DIAGNOSIS — Z79891 Long term (current) use of opiate analgesic: Secondary | ICD-10-CM

## 2016-06-14 DIAGNOSIS — Z96641 Presence of right artificial hip joint: Secondary | ICD-10-CM | POA: Diagnosis not present

## 2016-06-14 DIAGNOSIS — I959 Hypotension, unspecified: Secondary | ICD-10-CM | POA: Diagnosis not present

## 2016-06-14 DIAGNOSIS — H9193 Unspecified hearing loss, bilateral: Secondary | ICD-10-CM | POA: Diagnosis present

## 2016-06-14 DIAGNOSIS — Z882 Allergy status to sulfonamides status: Secondary | ICD-10-CM | POA: Diagnosis not present

## 2016-06-14 DIAGNOSIS — Z803 Family history of malignant neoplasm of breast: Secondary | ICD-10-CM | POA: Diagnosis not present

## 2016-06-14 DIAGNOSIS — M1991 Primary osteoarthritis, unspecified site: Secondary | ICD-10-CM | POA: Diagnosis not present

## 2016-06-14 DIAGNOSIS — Z7401 Bed confinement status: Secondary | ICD-10-CM | POA: Diagnosis not present

## 2016-06-14 DIAGNOSIS — G4730 Sleep apnea, unspecified: Secondary | ICD-10-CM | POA: Diagnosis present

## 2016-06-14 DIAGNOSIS — Z9981 Dependence on supplemental oxygen: Secondary | ICD-10-CM | POA: Diagnosis not present

## 2016-06-14 DIAGNOSIS — M1611 Unilateral primary osteoarthritis, right hip: Secondary | ICD-10-CM | POA: Diagnosis present

## 2016-06-14 DIAGNOSIS — Z9841 Cataract extraction status, right eye: Secondary | ICD-10-CM | POA: Diagnosis not present

## 2016-06-14 DIAGNOSIS — Z833 Family history of diabetes mellitus: Secondary | ICD-10-CM

## 2016-06-14 DIAGNOSIS — R2689 Other abnormalities of gait and mobility: Secondary | ICD-10-CM | POA: Diagnosis not present

## 2016-06-14 DIAGNOSIS — M25551 Pain in right hip: Secondary | ICD-10-CM

## 2016-06-14 DIAGNOSIS — Z96649 Presence of unspecified artificial hip joint: Secondary | ICD-10-CM

## 2016-06-14 DIAGNOSIS — K219 Gastro-esophageal reflux disease without esophagitis: Secondary | ICD-10-CM | POA: Diagnosis present

## 2016-06-14 DIAGNOSIS — D126 Benign neoplasm of colon, unspecified: Secondary | ICD-10-CM | POA: Diagnosis present

## 2016-06-14 DIAGNOSIS — Z8371 Family history of colonic polyps: Secondary | ICD-10-CM

## 2016-06-14 DIAGNOSIS — Z7984 Long term (current) use of oral hypoglycemic drugs: Secondary | ICD-10-CM

## 2016-06-14 DIAGNOSIS — M6281 Muscle weakness (generalized): Secondary | ICD-10-CM | POA: Diagnosis not present

## 2016-06-14 DIAGNOSIS — M810 Age-related osteoporosis without current pathological fracture: Secondary | ICD-10-CM | POA: Diagnosis present

## 2016-06-14 DIAGNOSIS — Z7983 Long term (current) use of bisphosphonates: Secondary | ICD-10-CM | POA: Diagnosis not present

## 2016-06-14 DIAGNOSIS — Z800 Family history of malignant neoplasm of digestive organs: Secondary | ICD-10-CM

## 2016-06-14 DIAGNOSIS — Z885 Allergy status to narcotic agent status: Secondary | ICD-10-CM | POA: Diagnosis not present

## 2016-06-14 DIAGNOSIS — Z8719 Personal history of other diseases of the digestive system: Secondary | ICD-10-CM

## 2016-06-14 DIAGNOSIS — M25559 Pain in unspecified hip: Secondary | ICD-10-CM | POA: Diagnosis not present

## 2016-06-14 DIAGNOSIS — J449 Chronic obstructive pulmonary disease, unspecified: Secondary | ICD-10-CM | POA: Diagnosis present

## 2016-06-14 DIAGNOSIS — N189 Chronic kidney disease, unspecified: Secondary | ICD-10-CM | POA: Diagnosis not present

## 2016-06-14 DIAGNOSIS — Z471 Aftercare following joint replacement surgery: Secondary | ICD-10-CM | POA: Diagnosis not present

## 2016-06-14 DIAGNOSIS — Z974 Presence of external hearing-aid: Secondary | ICD-10-CM

## 2016-06-14 HISTORY — PX: TOTAL HIP ARTHROPLASTY: SHX124

## 2016-06-14 LAB — GLUCOSE, CAPILLARY
GLUCOSE-CAPILLARY: 124 mg/dL — AB (ref 65–99)
GLUCOSE-CAPILLARY: 168 mg/dL — AB (ref 65–99)
GLUCOSE-CAPILLARY: 155 mg/dL — AB (ref 65–99)
Glucose-Capillary: 128 mg/dL — ABNORMAL HIGH (ref 65–99)

## 2016-06-14 LAB — ABO/RH: ABO/RH(D): A POS

## 2016-06-14 SURGERY — ARTHROPLASTY, HIP, TOTAL,POSTERIOR APPROACH
Anesthesia: Spinal | Site: Hip | Laterality: Right | Wound class: Clean

## 2016-06-14 MED ORDER — TRANEXAMIC ACID 1000 MG/10ML IV SOLN
1000.0000 mg | Freq: Once | INTRAVENOUS | Status: AC
  Administered 2016-06-14: 1000 mg via INTRAVENOUS
  Filled 2016-06-14: qty 10

## 2016-06-14 MED ORDER — CEFAZOLIN SODIUM-DEXTROSE 2-4 GM/100ML-% IV SOLN
2.0000 g | Freq: Four times a day (QID) | INTRAVENOUS | Status: AC
  Administered 2016-06-14 – 2016-06-15 (×4): 2 g via INTRAVENOUS
  Filled 2016-06-14 (×4): qty 100

## 2016-06-14 MED ORDER — ENOXAPARIN SODIUM 30 MG/0.3ML ~~LOC~~ SOLN
30.0000 mg | Freq: Two times a day (BID) | SUBCUTANEOUS | Status: DC
  Administered 2016-06-15 – 2016-06-17 (×4): 30 mg via SUBCUTANEOUS
  Filled 2016-06-14 (×5): qty 0.3

## 2016-06-14 MED ORDER — ONDANSETRON HCL 4 MG/2ML IJ SOLN
INTRAMUSCULAR | Status: DC | PRN
  Administered 2016-06-14: 4 mg via INTRAVENOUS

## 2016-06-14 MED ORDER — FENTANYL CITRATE (PF) 100 MCG/2ML IJ SOLN
INTRAMUSCULAR | Status: AC
  Filled 2016-06-14: qty 2

## 2016-06-14 MED ORDER — IBANDRONATE SODIUM 150 MG PO TABS: 150.0000 mg | ORAL_TABLET | ORAL | Status: DC

## 2016-06-14 MED ORDER — ONDANSETRON HCL 4 MG/2ML IJ SOLN: 4.0000 mg | Freq: Four times a day (QID) | INTRAMUSCULAR | Status: DC | PRN

## 2016-06-14 MED ORDER — MIDAZOLAM HCL 5 MG/5ML IJ SOLN
INTRAMUSCULAR | Status: DC | PRN
  Administered 2016-06-14: 1 mg via INTRAVENOUS

## 2016-06-14 MED ORDER — ACETAMINOPHEN 10 MG/ML IV SOLN
1000.0000 mg | Freq: Four times a day (QID) | INTRAVENOUS | Status: AC
  Administered 2016-06-14 – 2016-06-15 (×3): 1000 mg via INTRAVENOUS
  Filled 2016-06-14 (×4): qty 100

## 2016-06-14 MED ORDER — NEOMYCIN-POLYMYXIN B GU 40-200000 IR SOLN
Status: DC | PRN
Start: 2016-06-14 — End: 2016-06-14
  Administered 2016-06-14: 14 mL

## 2016-06-14 MED ORDER — SENNOSIDES-DOCUSATE SODIUM 8.6-50 MG PO TABS
1.0000 | ORAL_TABLET | Freq: Two times a day (BID) | ORAL | Status: DC
  Administered 2016-06-14 – 2016-06-17 (×6): 1 via ORAL
  Filled 2016-06-14 (×6): qty 1

## 2016-06-14 MED ORDER — CHLORHEXIDINE GLUCONATE 4 % EX LIQD: 60.0000 mL | Freq: Once | CUTANEOUS | Status: DC

## 2016-06-14 MED ORDER — DEXAMETHASONE SODIUM PHOSPHATE 10 MG/ML IJ SOLN
INTRAMUSCULAR | Status: DC | PRN
  Administered 2016-06-14: 10 mg via INTRAVENOUS

## 2016-06-14 MED ORDER — TIOTROPIUM BROMIDE MONOHYDRATE 18 MCG IN CAPS
18.0000 ug | ORAL_CAPSULE | Freq: Every day | RESPIRATORY_TRACT | Status: DC
  Administered 2016-06-15 – 2016-06-17 (×3): 18 ug via RESPIRATORY_TRACT
  Filled 2016-06-14: qty 5

## 2016-06-14 MED ORDER — ACETAMINOPHEN 650 MG RE SUPP: 650.0000 mg | Freq: Four times a day (QID) | RECTAL | Status: DC | PRN

## 2016-06-14 MED ORDER — ACETAMINOPHEN 10 MG/ML IV SOLN
INTRAVENOUS | Status: AC
  Filled 2016-06-14: qty 100

## 2016-06-14 MED ORDER — MONTELUKAST SODIUM 10 MG PO TABS
10.0000 mg | ORAL_TABLET | Freq: Every day | ORAL | Status: DC
  Administered 2016-06-14 – 2016-06-16 (×3): 10 mg via ORAL
  Filled 2016-06-14 (×3): qty 1

## 2016-06-14 MED ORDER — MENTHOL 3 MG MT LOZG
1.0000 | LOZENGE | OROMUCOSAL | Status: DC | PRN
  Filled 2016-06-14: qty 9

## 2016-06-14 MED ORDER — TRANEXAMIC ACID 1000 MG/10ML IV SOLN
1000.0000 mg | INTRAVENOUS | Status: DC
  Filled 2016-06-14: qty 10

## 2016-06-14 MED ORDER — ALUM & MAG HYDROXIDE-SIMETH 200-200-20 MG/5ML PO SUSP: 30.0000 mL | ORAL | Status: DC | PRN

## 2016-06-14 MED ORDER — ALBUTEROL SULFATE (2.5 MG/3ML) 0.083% IN NEBU: 2.5000 mg | INHALATION_SOLUTION | Freq: Four times a day (QID) | RESPIRATORY_TRACT | Status: DC | PRN

## 2016-06-14 MED ORDER — EPHEDRINE SULFATE 50 MG/ML IJ SOLN
INTRAMUSCULAR | Status: DC | PRN
  Administered 2016-06-14: 5 mg via INTRAVENOUS
  Administered 2016-06-14: 10 mg via INTRAVENOUS

## 2016-06-14 MED ORDER — TETRACAINE HCL 1 % IJ SOLN
INTRAMUSCULAR | Status: DC | PRN
  Administered 2016-06-14: 5 mg via INTRASPINAL

## 2016-06-14 MED ORDER — POLYVINYL ALCOHOL 1.4 % OP SOLN
1.0000 [drp] | OPHTHALMIC | Status: DC | PRN
  Filled 2016-06-14: qty 15

## 2016-06-14 MED ORDER — NEOMYCIN-POLYMYXIN B GU 40-200000 IR SOLN
Status: AC
  Filled 2016-06-14: qty 20

## 2016-06-14 MED ORDER — LACTATED RINGERS IV SOLN
INTRAVENOUS | Status: DC | PRN
  Administered 2016-06-14: 09:00:00 via INTRAVENOUS

## 2016-06-14 MED ORDER — FUROSEMIDE 20 MG PO TABS: 20.0000 mg | ORAL_TABLET | Freq: Every day | ORAL | Status: DC | PRN

## 2016-06-14 MED ORDER — BISACODYL 10 MG RE SUPP: 10.0000 mg | Freq: Every day | RECTAL | Status: DC | PRN

## 2016-06-14 MED ORDER — FERROUS SULFATE 325 (65 FE) MG PO TABS
325.0000 mg | ORAL_TABLET | Freq: Two times a day (BID) | ORAL | Status: DC
  Administered 2016-06-14 – 2016-06-17 (×6): 325 mg via ORAL
  Filled 2016-06-14 (×6): qty 1

## 2016-06-14 MED ORDER — BUPIVACAINE HCL (PF) 0.5 % IJ SOLN
INTRAMUSCULAR | Status: DC | PRN
  Administered 2016-06-14: 2 mL

## 2016-06-14 MED ORDER — TAPENTADOL HCL 50 MG PO TABS
50.0000 mg | ORAL_TABLET | ORAL | Status: DC | PRN
  Administered 2016-06-14 (×2): 50 mg via ORAL
  Administered 2016-06-15: 100 mg via ORAL
  Administered 2016-06-15 – 2016-06-17 (×6): 50 mg via ORAL
  Filled 2016-06-14 (×3): qty 1
  Filled 2016-06-14: qty 2
  Filled 2016-06-14 (×2): qty 1
  Filled 2016-06-14: qty 2
  Filled 2016-06-14 (×4): qty 1

## 2016-06-14 MED ORDER — ACETAMINOPHEN 325 MG PO TABS
650.0000 mg | ORAL_TABLET | Freq: Four times a day (QID) | ORAL | Status: DC | PRN
Start: 2016-06-14 — End: 2016-06-17

## 2016-06-14 MED ORDER — VITAMIN D 1000 UNITS PO TABS
2000.0000 [IU] | ORAL_TABLET | Freq: Every day | ORAL | Status: DC
  Administered 2016-06-15 – 2016-06-17 (×3): 2000 [IU] via ORAL
  Filled 2016-06-14 (×3): qty 2

## 2016-06-14 MED ORDER — SODIUM CHLORIDE 0.9 % IV SOLN
INTRAVENOUS | Status: DC
  Administered 2016-06-14 (×2): via INTRAVENOUS

## 2016-06-14 MED ORDER — METFORMIN HCL 500 MG PO TABS
250.0000 mg | ORAL_TABLET | Freq: Every day | ORAL | Status: DC
  Administered 2016-06-15 – 2016-06-17 (×3): 250 mg via ORAL
  Filled 2016-06-14 (×3): qty 1

## 2016-06-14 MED ORDER — ALBUTEROL SULFATE HFA 108 (90 BASE) MCG/ACT IN AERS
2.0000 | INHALATION_SPRAY | Freq: Four times a day (QID) | RESPIRATORY_TRACT | Status: DC | PRN
Start: 2016-06-14 — End: 2016-06-14

## 2016-06-14 MED ORDER — PHENYLEPHRINE HCL 10 MG/ML IJ SOLN
INTRAMUSCULAR | Status: DC | PRN
  Administered 2016-06-14: 200 ug via INTRAVENOUS
  Administered 2016-06-14: 100 ug via INTRAVENOUS
  Administered 2016-06-14 (×2): 200 ug via INTRAVENOUS
  Administered 2016-06-14: 100 ug via INTRAVENOUS
  Administered 2016-06-14: 200 ug via INTRAVENOUS
  Administered 2016-06-14: 100 ug via INTRAVENOUS

## 2016-06-14 MED ORDER — PROPOFOL 500 MG/50ML IV EMUL
INTRAVENOUS | Status: DC | PRN
  Administered 2016-06-14: 65 ug/kg/min via INTRAVENOUS

## 2016-06-14 MED ORDER — FAMOTIDINE 20 MG PO TABS
20.0000 mg | ORAL_TABLET | Freq: Once | ORAL | Status: AC
  Administered 2016-06-14: 20 mg via ORAL

## 2016-06-14 MED ORDER — SODIUM CHLORIDE 0.9 % IV SOLN
INTRAVENOUS | Status: DC
  Administered 2016-06-14: 14:00:00 via INTRAVENOUS

## 2016-06-14 MED ORDER — ACETAMINOPHEN 10 MG/ML IV SOLN
INTRAVENOUS | Status: DC | PRN
  Administered 2016-06-14: 1000 mg via INTRAVENOUS

## 2016-06-14 MED ORDER — ONDANSETRON HCL 4 MG PO TABS: 4.0000 mg | ORAL_TABLET | Freq: Four times a day (QID) | ORAL | Status: DC | PRN

## 2016-06-14 MED ORDER — DIPHENHYDRAMINE HCL 12.5 MG/5ML PO ELIX: 12.5000 mg | ORAL_SOLUTION | ORAL | Status: DC | PRN

## 2016-06-14 MED ORDER — METOCLOPRAMIDE HCL 10 MG PO TABS
10.0000 mg | ORAL_TABLET | Freq: Three times a day (TID) | ORAL | Status: AC
  Administered 2016-06-14 – 2016-06-16 (×8): 10 mg via ORAL
  Filled 2016-06-14 (×8): qty 1

## 2016-06-14 MED ORDER — MORPHINE SULFATE (PF) 2 MG/ML IV SOLN: 2.0000 mg | INTRAVENOUS | Status: DC | PRN

## 2016-06-14 MED ORDER — CEFAZOLIN SODIUM-DEXTROSE 2-4 GM/100ML-% IV SOLN
2.0000 g | INTRAVENOUS | Status: AC
  Administered 2016-06-14: 2 g via INTRAVENOUS

## 2016-06-14 MED ORDER — SODIUM CHLORIDE 0.9 % IV SOLN
INTRAVENOUS | Status: DC | PRN
  Administered 2016-06-14: 10 ug/min via INTRAVENOUS

## 2016-06-14 MED ORDER — PANTOPRAZOLE SODIUM 40 MG PO TBEC
40.0000 mg | DELAYED_RELEASE_TABLET | Freq: Two times a day (BID) | ORAL | Status: DC
  Administered 2016-06-14 – 2016-06-17 (×6): 40 mg via ORAL
  Filled 2016-06-14 (×6): qty 1

## 2016-06-14 MED ORDER — FENTANYL CITRATE (PF) 100 MCG/2ML IJ SOLN
25.0000 ug | INTRAMUSCULAR | Status: DC | PRN
  Administered 2016-06-14: 25 ug via INTRAVENOUS

## 2016-06-14 MED ORDER — FLEET ENEMA 7-19 GM/118ML RE ENEM
1.0000 | ENEMA | Freq: Once | RECTAL | Status: AC | PRN
  Administered 2016-06-16: 1 via RECTAL

## 2016-06-14 MED ORDER — FENTANYL CITRATE (PF) 100 MCG/2ML IJ SOLN
INTRAMUSCULAR | Status: DC | PRN
  Administered 2016-06-14: 50 ug via INTRAVENOUS

## 2016-06-14 MED ORDER — MAGNESIUM HYDROXIDE 400 MG/5ML PO SUSP
30.0000 mL | Freq: Every day | ORAL | Status: DC | PRN
  Administered 2016-06-15: 30 mL via ORAL
  Filled 2016-06-14: qty 30

## 2016-06-14 MED ORDER — INSULIN ASPART 100 UNIT/ML ~~LOC~~ SOLN
0.0000 [IU] | Freq: Three times a day (TID) | SUBCUTANEOUS | Status: DC
  Administered 2016-06-14 – 2016-06-17 (×3): 2 [IU] via SUBCUTANEOUS
  Filled 2016-06-14 (×3): qty 2

## 2016-06-14 MED ORDER — OMEGA-3-ACID ETHYL ESTERS 1 G PO CAPS: 1.0000 g | ORAL_CAPSULE | Freq: Every day | ORAL | Status: DC

## 2016-06-14 MED ORDER — TRAMADOL HCL 50 MG PO TABS
50.0000 mg | ORAL_TABLET | ORAL | Status: DC | PRN
  Administered 2016-06-14 – 2016-06-17 (×8): 50 mg via ORAL
  Filled 2016-06-14 (×8): qty 1

## 2016-06-14 MED ORDER — PHENOL 1.4 % MT LIQD
1.0000 | OROMUCOSAL | Status: DC | PRN
  Filled 2016-06-14: qty 177

## 2016-06-14 MED ORDER — POTASSIUM CHLORIDE ER 8 MEQ PO TBCR
8.0000 meq | EXTENDED_RELEASE_TABLET | ORAL | Status: DC | PRN
  Filled 2016-06-14: qty 1

## 2016-06-14 SURGICAL SUPPLY — 50 items
BLADE DRUM FLTD (BLADE) ×3 IMPLANT
BLADE SAW 1 (BLADE) ×3 IMPLANT
CANISTER SUCT 1200ML W/VALVE (MISCELLANEOUS) ×3 IMPLANT
CANISTER SUCT 3000ML (MISCELLANEOUS) ×6 IMPLANT
CAPT HIP TOTAL 2 ×3 IMPLANT
CARTRIDGE OIL MAESTRO DRILL (MISCELLANEOUS) ×1 IMPLANT
CATH FOL LEG HOLDER (MISCELLANEOUS) ×3 IMPLANT
CATH TRAY METER 16FR LF (MISCELLANEOUS) ×3 IMPLANT
DIFFUSER MAESTRO (MISCELLANEOUS) ×3 IMPLANT
DRAPE INCISE IOBAN 66X60 STRL (DRAPES) ×3 IMPLANT
DRAPE SHEET LG 3/4 BI-LAMINATE (DRAPES) ×3 IMPLANT
DRSG DERMACEA 8X12 NADH (GAUZE/BANDAGES/DRESSINGS) ×3 IMPLANT
DRSG OPSITE POSTOP 3X4 (GAUZE/BANDAGES/DRESSINGS) ×3 IMPLANT
DRSG OPSITE POSTOP 4X12 (GAUZE/BANDAGES/DRESSINGS) ×3 IMPLANT
DRSG OPSITE POSTOP 4X14 (GAUZE/BANDAGES/DRESSINGS) ×3 IMPLANT
DRSG TEGADERM 4X4.75 (GAUZE/BANDAGES/DRESSINGS) ×3 IMPLANT
DURAPREP 26ML APPLICATOR (WOUND CARE) ×3 IMPLANT
ELECT BLADE 6.5 EXT (BLADE) ×3 IMPLANT
ELECT CAUTERY BLADE 6.4 (BLADE) ×3 IMPLANT
GLOVE BIOGEL M STRL SZ7.5 (GLOVE) ×6 IMPLANT
GLOVE INDICATOR 8.0 STRL GRN (GLOVE) ×3 IMPLANT
GLOVE SURG 9.0 ORTHO LTXF (GLOVE) ×3 IMPLANT
GLOVE SURG ORTHO 9.0 STRL STRW (GLOVE) ×3 IMPLANT
GOWN STRL REUS W/ TWL LRG LVL3 (GOWN DISPOSABLE) ×2 IMPLANT
GOWN STRL REUS W/TWL 2XL LVL3 (GOWN DISPOSABLE) ×3 IMPLANT
GOWN STRL REUS W/TWL LRG LVL3 (GOWN DISPOSABLE) ×4
HANDPIECE INTERPULSE COAX TIP (DISPOSABLE) ×2
HEMOVAC 400CC 10FR (MISCELLANEOUS) ×3 IMPLANT
HOOD PEEL AWAY FLYTE STAYCOOL (MISCELLANEOUS) ×6 IMPLANT
KIT RM TURNOVER STRD PROC AR (KITS) ×3 IMPLANT
NDL SAFETY 18GX1.5 (NEEDLE) ×3 IMPLANT
NS IRRIG 500ML POUR BTL (IV SOLUTION) ×3 IMPLANT
OIL CARTRIDGE MAESTRO DRILL (MISCELLANEOUS) ×3
PACK HIP PROSTHESIS (MISCELLANEOUS) ×3 IMPLANT
SET HNDPC FAN SPRY TIP SCT (DISPOSABLE) ×1 IMPLANT
SOL .9 NS 3000ML IRR  AL (IV SOLUTION) ×2
SOL .9 NS 3000ML IRR UROMATIC (IV SOLUTION) ×1 IMPLANT
SOL PREP PVP 2OZ (MISCELLANEOUS) ×3
SOLUTION PREP PVP 2OZ (MISCELLANEOUS) ×1 IMPLANT
SPONGE DRAIN TRACH 4X4 STRL 2S (GAUZE/BANDAGES/DRESSINGS) ×3 IMPLANT
STAPLER SKIN PROX 35W (STAPLE) ×3 IMPLANT
SUT ETHIBOND #5 BRAIDED 30INL (SUTURE) ×3 IMPLANT
SUT VIC AB 0 CT1 36 (SUTURE) ×3 IMPLANT
SUT VIC AB 1 CT1 36 (SUTURE) ×6 IMPLANT
SUT VIC AB 2-0 CT1 27 (SUTURE) ×2
SUT VIC AB 2-0 CT1 TAPERPNT 27 (SUTURE) ×1 IMPLANT
SYR 20CC LL (SYRINGE) ×3 IMPLANT
TAPE ADH 3 LX (MISCELLANEOUS) ×3 IMPLANT
TAPE TRANSPORE STRL 2 31045 (GAUZE/BANDAGES/DRESSINGS) ×3 IMPLANT
TOWEL OR 17X26 4PK STRL BLUE (TOWEL DISPOSABLE) ×3 IMPLANT

## 2016-06-14 NOTE — Progress Notes (Signed)
CSW received consult for possible SNF placement. CSW is awaiting PT evaluation to be completed to determine the appropriate level of care needed for patient. CSW will continue to follow and assist.  Ernest Pine, MSW, LCSW, [place] Social Worker [phone]

## 2016-06-14 NOTE — Progress Notes (Signed)
PT Cancellation Note  Patient Details Name: Amber Holder MRN: [id number] DOB: April 11, 1946   Cancelled Treatment:    Reason Eval/Treat Not Completed: Medical issues which prohibited therapy (Consult received and chart reviewed.  Evaluation attempted; however, patient still reporting full numbness/paresthesia throughout R LE.  Evaluation and mobility deferred until sensation returned and patient able to appropriately bear weight.)   Micajah Dennin H. Owens Shark, PT, DPT, NCS 06/14/16, 2:37 PM [phone]

## 2016-06-14 NOTE — Transfer of Care (Signed)
Immediate Anesthesia Transfer of Care Note  Patient: Amber Holder  Procedure(s) Performed: Procedure(s): TOTAL HIP ARTHROPLASTY (Right)  Patient Location: PACU  Anesthesia Type:Spinal  Level of Consciousness: awake and patient cooperative  Airway & Oxygen Therapy: Patient Spontanous Breathing and Patient connected to nasal cannula oxygen  Post-op Assessment: Report given to RN and Post -op Vital signs reviewed and stable  Post vital signs: Reviewed and stable  Last Vitals:  Vitals:   06/14/16 0625 06/14/16 1045  BP: (!) 140/59 105/60  Pulse: 88 79  Resp: 18 16  Temp: 36.9 C 36.3 C    Last Pain:  Vitals:   06/14/16 0625  TempSrc: Oral  PainSc: 7       Patients Stated Pain Goal: 1 ([id number])  Complications: No apparent anesthesia complications

## 2016-06-14 NOTE — Op Note (Signed)
OPERATIVE NOTE  DATE OF SURGERY:  06/14/2016  PATIENT NAME:  Shawnique Tillison   DOB: [birth year]  MRN: [id number]  PRE-OPERATIVE DIAGNOSIS: Degenerative arthrosis of the right hip, primary  POST-OPERATIVE DIAGNOSIS:  Same  PROCEDURE:  Right total hip arthroplasty  SURGEON:  Marciano Sequin. M.D.  ASSISTANT:  Vance Peper, PA (present and scrubbed throughout the case, critical for assistance with exposure, retraction, instrumentation, and closure)  ANESTHESIA: spinal  ESTIMATED BLOOD LOSS: 50 mL  FLUIDS REPLACED: 1700 mL of crystalloid  DRAINS: 2 medium drains to a Hemovac reservoir  IMPLANTS UTILIZED: DePuy 13.5 mm small stature AML femoral stem, 52 mm OD Pinnacle 100 acetabular component, neutral Pinnacle Altrx polyethylene insert, and a 36 mm M-SPEC +1.5 mm hip ball  INDICATIONS FOR SURGERY: Christ Erlich is a 70 y.o. year old female with a long history of progressive hip and groin  pain. X-rays demonstrated severe degenerative changes. The patient had not seen any significant improvement despite conservative nonsurgical intervention. After discussion of the risks and benefits of surgical intervention, the patient expressed understanding of the risks benefits and agree with plans for total hip arthroplasty.   The risks, benefits, and alternatives were discussed at length including but not limited to the risks of infection, bleeding, nerve injury, stiffness, blood clots, the need for revision surgery, limb length inequality, dislocation, cardiopulmonary complications, among others, and they were willing to proceed.  PROCEDURE IN DETAIL: The patient was brought into the operating room and, after adequate spinal anesthesia was achieved, the patient was placed in a left lateral decubitus position. Axillary roll was placed and all bony prominences were well-padded. The patient's right hip was cleaned and prepped with alcohol and DuraPrep and draped in the usual sterile fashion. A "timeout"  was performed as per usual protocol. A lateral curvilinear incision was made gently curving towards the posterior superior iliac spine. The IT band was incised in line with the skin incision and the fibers of the gluteus maximus were split in line. The piriformis tendon was identified, skeletonized, and incised at its insertion to the proximal femur and reflected posteriorly. A T type posterior capsulotomy was performed. Prior to dislocation of the femoral head, a threaded Steinmann pin was inserted through a separate stab incision into the pelvis superior to the acetabulum and bent in the form of a stylus so as to assess limb length and hip offset throughout the procedure. The femoral head was then dislocated posteriorly. Inspection of the femoral head demonstrated severe degenerative changes with full-thickness loss of articular cartilage. The femoral neck cut was performed using an oscillating saw. The anterior capsule was elevated off of the femoral neck using a periosteal elevator. Attention was then directed to the acetabulum. The remnant of the labrum was excised using electrocautery. Inspection of the acetabulum also demonstrated significant degenerative changes. The acetabulum was reamed in sequential fashion up to a 51 mm diameter. Good punctate bleeding bone was encountered. A 52 mm Pinnacle 100 acetabular component was positioned and impacted into place. Good scratch fit was appreciated. A neutral polyethylene trial was inserted.  Attention was then directed to the proximal femur. A hole for reaming of the proximal femoral canal was created using a high-speed burr. The femoral canal was reamed in sequential fashion up to a 13.5 mm diameter. This allowed for approximately 6.5 cm of scratch fit. Serial broaches were inserted up to a 13.5 mm small stature femoral broach. [place]car region was planed and a trial reduction was performed using a  36 mm hip ball with a +1.5 mm neck length. Good equalization of  limb lengths and hip offset was appreciated and excellent stability was noted both anteriorly and posteriorly. Trial components were removed. The acetabular shell was irrigated with copious amounts of normal saline with antibiotic solution and suctioned dry. A neutral Pinnacle Altrx polyethylene insert was positioned and impacted into place. Next, a 13.5 mm small stature AML femoral stem was positioned and impacted into place. Excellent scratch fit was appreciated. A trial reduction was again performed with a 36 mm hip ball with a +1.5 mm neck length. Again, good equalization of limb lengths was appreciated and excellent stability appreciated both anteriorly and posteriorly. The hip was then dislocated and the trial hip ball was removed. The Morse taper was cleaned and dried. A 36 mm M-SPEC hip ball with a +1.5 mm neck length was placed on the trunnion and impacted into place. The hip was then reduced and placed through range of motion. Excellent stability was appreciated both anteriorly and posteriorly.  The wound was irrigated with copious amounts of normal saline with antibiotic solution and suctioned dry. Good hemostasis was appreciated. The posterior capsulotomy was repaired using #5 Ethibond. Piriformis tendon was reapproximated to the undersurface of the gluteus medius tendon using #5 Ethibond. Two medium drains were placed in the wound bed and brought out through separate stab incisions to be attached to a Hemovac reservoir. The IT band was reapproximated using interrupted sutures of #1 Vicryl. Subcutaneous tissue was approximated using first #0 Vicryl followed by #2-0 Vicryl. The skin was closed with skin staples.  The patient tolerated the procedure well and was transported to the recovery room in stable condition.   Marciano Sequin., M.D.

## 2016-06-14 NOTE — Anesthesia Preprocedure Evaluation (Signed)
Anesthesia Evaluation  Patient identified by MRN, date of birth, ID band Patient awake    Reviewed: Allergy & Precautions, H&P , NPO status , Patient's Chart, lab work & pertinent test results  History of Anesthesia Complications Negative for: history of anesthetic complications  Airway Mallampati: III  TM Distance: <3 FB Neck ROM: limited    Dental no notable dental hx. (+) Poor Dentition, Chipped, Caps   Pulmonary asthma , sleep apnea , COPD, Current Smoker,    Pulmonary exam normal breath sounds clear to auscultation       Cardiovascular Exercise Tolerance: Good (-) angina(-) Past MI negative cardio ROS Normal cardiovascular exam Rhythm:regular Rate:Normal     Neuro/Psych negative neurological ROS  negative psych ROS   GI/Hepatic Neg liver ROS, GERD  Controlled,  Endo/Other  diabetes, Type 2  Renal/GU Renal disease     Musculoskeletal  (+) Arthritis ,   Abdominal   Peds  Hematology negative hematology ROS (+)   Anesthesia Other Findings Past Medical History: No date: Arthritis No date: Asthma No date: Chronic kidney disease     Comment: Kidney stones No date: COPD (chronic obstructive pulmonary disease) (* No date: Diabetes mellitus without complication (HCC) No date: GERD (gastroesophageal reflux disease) No date: HOH (hard of hearing)     Comment: Bilateral hearing aids No date: Osteoporosis No date: Sleep apnea     Comment: Does not use C-PAP on a regular basis No date: Venous stasis  Past Surgical History: No date: APPENDECTOMY No date: cataract surgery Bilateral No date: CHOLECYSTECTOMY No date: COLONOSCOPY 05/28/2015: COLONOSCOPY WITH PROPOFOL N/A     Comment: Procedure: COLONOSCOPY WITH PROPOFOL;                Surgeon: Lollie Sails, MD;  Location: [facility]              ENDOSCOPY;  Service: Endoscopy;  Laterality:               N/A; 2015: EYE SURGERY Bilateral     Comment: Cataract  Extraction with IOL 2015: LITHOTRIPSY     Comment: has had many stones 1970: Mastoidotomy 2011: [place] Right  BMI    Body Mass Index:  29.12 kg/m      Reproductive/Obstetrics negative OB ROS                             Anesthesia Physical Anesthesia Plan  ASA: III  Anesthesia Plan: Spinal   Post-op Pain Management:    Induction:   Airway Management Planned:   Additional Equipment:   Intra-op Plan:   Post-operative Plan:   Informed Consent: I have reviewed the patients History and Physical, chart, labs and discussed the procedure including the risks, benefits and alternatives for the proposed anesthesia with the patient or authorized representative who has indicated his/her understanding and acceptance.     Plan Discussed with: Anesthesiologist, CRNA and Surgeon  Anesthesia Plan Comments:         Anesthesia Quick Evaluation

## 2016-06-14 NOTE — OR Nursing (Signed)
Patient is very hard of hearing.  Mindy Huffines, RN from the or explained or procedures to patient prior to leaving preop area so that patient could remove her hearing aides.  She can read lips somewhat if people look directly at her.

## 2016-06-14 NOTE — Brief Op Note (Signed)
06/14/2016  10:42 AM  PATIENT:  Amber Holder  70 y.o. female  PRE-OPERATIVE DIAGNOSIS:  PRIMARY OSTEOARTHRITIS of the right hip  POST-OPERATIVE DIAGNOSIS:  Same  PROCEDURE:  Procedure(s): TOTAL HIP ARTHROPLASTY (Right)  SURGEON:  Surgeon(s) and Role:    * Dereck Leep, MD - Primary  ASSISTANTS: Vance Peper, PA   ANESTHESIA:   spinal  EBL:  Total I/O In: 1000 [I.V.:1000] Out: 250 [Urine:200; Blood:50]  BLOOD ADMINISTERED:none  DRAINS: 2 medium hemovac   LOCAL MEDICATIONS USED:  NONE  SPECIMEN:  Source of Specimen:  Right femoral head  DISPOSITION OF SPECIMEN:  PATHOLOGY  COUNTS:  YES  TOURNIQUET:  * No tourniquets in log *  DICTATION: .Dragon Dictation  PLAN OF CARE: Admit to inpatient   PATIENT DISPOSITION:  PACU - hemodynamically stable.   Delay start of Pharmacological VTE agent (>24hrs) due to surgical blood loss or risk of bleeding: yes

## 2016-06-14 NOTE — H&P (Signed)
The patient has been re-examined, and the chart reviewed, and there have been no interval changes to the documented history and physical.    The risks, benefits, and alternatives have been discussed at length. The patient expressed understanding of the risks benefits and agreed with plans for surgical intervention.  James P. Hooten, Jr. M.D.    

## 2016-06-14 NOTE — Transfer of Care (Signed)
Immediate Anesthesia Transfer of Care Note  Patient: Amber Holder  Procedure(s) Performed: Procedure(s): TOTAL HIP ARTHROPLASTY (Right)  Patient Location: PACU  Anesthesia Type:Spinal  Level of Consciousness: awake and patient cooperative  Airway & Oxygen Therapy: Patient Spontanous Breathing and Patient connected to nasal cannula oxygen  Post-op Assessment: Report given to RN and Post -op Vital signs reviewed and stable  Post vital signs: Reviewed and stable  Last Vitals:  Vitals:   06/14/16 0625  BP: (!) 140/59  Pulse: 88  Resp: 18  Temp: 36.9 C    Last Pain:  Vitals:   06/14/16 0625  TempSrc: Oral  PainSc: 7       Patients Stated Pain Goal: 1 ([id number])  Complications: No apparent anesthesia complications

## 2016-06-14 NOTE — Anesthesia Procedure Notes (Addendum)
Spinal  Patient location during procedure: OR Start time: 06/14/2016 7:25 AM End time: 06/14/2016 7:26 AM Staffing Anesthesiologist: Katy Fitch K Performed: anesthesiologist  Preanesthetic Checklist Completed: patient identified, site marked, surgical consent, pre-op evaluation, timeout performed, IV checked, risks and benefits discussed and monitors and equipment checked Spinal Block Patient position: sitting Prep: ChloraPrep Patient monitoring: heart rate, continuous pulse ox, blood pressure and cardiac monitor Approach: midline Location: L4-5 Injection technique: single-shot Needle Needle type: Whitacre and Introducer  Needle gauge: 24 G Needle length: 9 cm Assessment Sensory level: T10 Additional Notes Negative paresthesia. Negative blood return. Positive free-flowing CSF. Expiration date of kit checked and confirmed. Patient tolerated procedure well, without complications.

## 2016-06-15 LAB — GLUCOSE, CAPILLARY
GLUCOSE-CAPILLARY: 131 mg/dL — AB (ref 65–99)
GLUCOSE-CAPILLARY: 114 mg/dL — AB (ref 65–99)
GLUCOSE-CAPILLARY: 101 mg/dL — AB (ref 65–99)
Glucose-Capillary: 124 mg/dL — ABNORMAL HIGH (ref 65–99)

## 2016-06-15 LAB — BASIC METABOLIC PANEL
ANION GAP: 4 — AB (ref 5–15)
BUN: 16 mg/dL (ref 6–20)
CHLORIDE: 112 mmol/L — AB (ref 101–111)
CO2: 22 mmol/L (ref 22–32)
Calcium: 8.2 mg/dL — ABNORMAL LOW (ref 8.9–10.3)
Creatinine, Ser: 0.51 mg/dL (ref 0.44–1.00)
Glucose, Bld: 130 mg/dL — ABNORMAL HIGH (ref 65–99)
POTASSIUM: 3.9 mmol/L (ref 3.5–5.1)
SODIUM: 138 mmol/L (ref 135–145)

## 2016-06-15 LAB — CBC
HCT: 31.3 % — ABNORMAL LOW (ref 35.0–47.0)
HEMOGLOBIN: 10.5 g/dL — AB (ref 12.0–16.0)
MCH: 29.6 pg (ref 26.0–34.0)
MCHC: 33.6 g/dL (ref 32.0–36.0)
MCV: 88 fL (ref 80.0–100.0)
PLATELETS: 158 10*3/uL (ref 150–440)
RBC: 3.56 MIL/uL — AB (ref 3.80–5.20)
RDW: 13.2 % (ref 11.5–14.5)
WBC: 11.7 10*3/uL — AB (ref 3.6–11.0)

## 2016-06-15 MED ORDER — SODIUM CHLORIDE 0.9 % IV BOLUS (SEPSIS)
500.0000 mL | Freq: Once | INTRAVENOUS | Status: AC
  Administered 2016-06-15: 500 mL via INTRAVENOUS

## 2016-06-15 NOTE — Evaluation (Signed)
Occupational Therapy Evaluation Patient Details Name: Amber Holder MRN: [id number] DOB: 04-Apr-1946 Today's Date: 06/15/2016    History of Present Illness admitted for hospitalization status post R THR, posterior approach/WBAT, 06/14/16.   Clinical Impression   Pt is 70 year old female s/p R THR(posterior)  who lives at alone. Pt was independent in all ADLs prior to surgery and is eager to return to PLOF. She has been planning for this surgery and has a raised toilet seat, sit to stand rising recliner (electric), LH shoe horn and a shower chair with back. Pt is currently limited in functional ADLs due to pain and decreased ROM.  Pt requires total assist for LB dressing without AD and with AD (reacher and sock aid) minimal assist for LB dressing and bathing skills due to pain and decreased AROM of R LE  and would benefit from continued skilled OT services for education in assistive devices, functional mobility, and education in recommendations for home modifications to increase safety and prevent falls.  Pt is a good candidate for SNF to continue rehabilitation since she lives at home alone and would be home alone first few nights home.       Follow Up Recommendations  SNF (pt wants to go home and this is the plan she needs OT HH--SNF would be better plan)    Equipment Recommendations  Other (comment) (reacher, sock aid)    Recommendations for Other Services       Precautions / Restrictions Precautions Precautions: Posterior Hip;Fall Restrictions Weight Bearing Restrictions: Yes RLE Weight Bearing: Weight bearing as tolerated      Mobility Bed Mobility Overal bed mobility: Needs Assistance Bed Mobility: Supine to Sit     Supine to sit: Mod assist     General bed mobility comments: for LE management and truncal elevation  Transfers Overall transfer level: Needs assistance Equipment used: Rolling walker (2 wheeled) Transfers: Sit to/from Stand Sit to Stand: Min assist         General transfer comment: slow and guarded, cuing for hand placement    Balance Overall balance assessment: Needs assistance Sitting-balance support: No upper extremity supported;Feet supported Sitting balance-Leahy Scale: Good     Standing balance support: Bilateral upper extremity supported Standing balance-Leahy Scale: Fair                              ADL Overall ADL's : Needs assistance/impaired Eating/Feeding: Independent;Set up   Grooming: Wash/dry hands;Wash/dry face;Oral care;Applying deodorant;Brushing hair;Set up           Upper Body Dressing : Independent;Set up   Lower Body Dressing: Minimal assistance;Set up;With adaptive equipment Lower Body Dressing Details (indicate cue type and reason): cues for post hip precautions                     Vision     Perception     Praxis      Pertinent Vitals/Pain Pain Assessment: 0-10 Pain Score: 2  Pain Location: R hip Pain Descriptors / Indicators: Aching Pain Intervention(s): Limited activity within patient's tolerance;Monitored during session;Premedicated before session     Hand Dominance Right   Extremity/Trunk Assessment Upper Extremity Assessment Upper Extremity Assessment: Overall WFL for tasks assessed   Lower Extremity Assessment Lower Extremity Assessment: Defer to PT evaluation       Communication Communication Communication: HOH   Cognition Arousal/Alertness: Awake/alert Behavior During Therapy: WFL for tasks assessed/performed Overall Cognitive Status: Within  Functional Limits for tasks assessed                     General Comments       Exercises   Other Exercises Other Exercises: Supine LE therex, 1x10, AROM for muscular strength/endurance with functional activities: ankle pumps, quad sets, SAQs, heel slides, hip abduct/adduct.   Shoulder Instructions      Home Living Family/patient expects to be discharged to:: Private residence Living  Arrangements: Alone Available Help at Discharge: Family;Available PRN/intermittently Type of Home: House Home Access: Stairs to enter [employer] of Steps: 4 Entrance Stairs-Rails: Can reach both Home Layout: One level     Bathroom Shower/Tub: Walk-in shower (has a Civil engineer, contracting)   Bathroom Toilet: Handicapped height (has a raised toilet seat) Bathroom Accessibility: Yes How Accessible: Accessible via walker Home Equipment: Adaptive equipment;Shower seat;Toilet riser Adaptive Equipment: Long-handled shoe horn        Prior Functioning/Environment Level of Independence: Independent        Comments: Indep with ADLs, household and community activities; denies recent fall history; + driving.        OT Problem List: Decreased strength;Decreased range of motion;Decreased activity tolerance;Pain;Decreased knowledge of use of DME or AE   OT Treatment/Interventions: Self-care/ADL training;Patient/family education;Therapeutic activities;DME and/or AE instruction    OT Goals(Current goals can be found in the care plan section) Acute Rehab OT Goals Patient Stated Goal: to return home OT Goal Formulation: With patient Time For Goal Achievement: 06/29/16 Potential to Achieve Goals: Good ADL Goals Pt Will Perform Lower Body Bathing: with min assist;with adaptive equipment;sit to/from stand (using LH sponge) Pt Will Perform Lower Body Dressing: with min assist;with adaptive equipment;sit to/from stand (sitting using FWW and no LOB with AD obs hip precautions) Pt Will Transfer to Toilet: with min assist;stand pivot transfer;bedside commode (BSC over toilet to simulate raised toilet seat)  OT Frequency: Min 1X/week   Barriers to D/C:            Co-evaluation              End of Session    Activity Tolerance: Patient tolerated treatment well Patient left: in chair;with call bell/phone within reach;with chair alarm set   Time: 0940-1010 OT Time Calculation (min):  30 min Charges:  OT General Charges $OT Visit: 1 Procedure OT Evaluation $OT Eval Low Complexity: 1 Procedure OT Treatments $Self Care/Home Management : 8-22 mins G-Codes:     [name], OTR/L Feeding Team ascom [phone] 06/15/16, 11:03 AM

## 2016-06-15 NOTE — Clinical Social Work Note (Signed)
Clinical Social Work Assessment  Patient Details  Name: Amber Holder MRN: [id number] Date of Birth: [birth year]  Date of referral:  06/15/16               Reason for consult:  Facility Placement                Permission sought to share information with:  Case Manager, Chartered certified accountant granted to share information::  No  Name::        Agency::     Relationship::     Contact Information:     Housing/Transportation Living arrangements for the past 2 months:  Single Family Home Source of Information:  Patient, Medical Team, Case Manager Patient Interpreter Needed:  None Criminal Activity/Legal Involvement Pertinent to Current Situation/Hospitalization:  No - Comment as needed Significant Relationships:  Adult Children, Other Family Members Lives with:  Self Do you feel safe going back to the place where you live?  Yes Need for family participation in patient care:  No (Coment)  Care giving concerns:  Patient lives alone, however does have family involved in care. Wishes to go home if at all possible, but willing to follow treatment recommendations of MD and team. Reports she will go to SNF if needed, but hoping to progress with PT.   Social Worker assessment / plan:  LCSW completed consult and work up for potential SNF placement. If patient is in agreement and needing SNF, LCSW will present bed offers and follow up with placement.  Employment status:  Retired Forensic scientist:  Information systems manager PT Recommendations:  [place], Home with [place] / Referral to community resources:  [place]  Patient/Family's Response to care:  Agreeable to plan  Patient/Family's Understanding of and Emotional Response to Diagnosis, Current Treatment, and Prognosis:  Understanding of recommendations and possible need for SNF. Patient aware of her current needs and limitations at home by living alone.  Emotional  Assessment Appearance:  Appears stated age Attitude/Demeanor/Rapport:  Other (cooperative) Affect (typically observed):  Accepting, Adaptable Orientation:  Oriented to Self, Oriented to Place, Oriented to  Time, Oriented to Situation Alcohol / Substance use:  Not Applicable Psych involvement (Current and /or in the community):  No (Comment)  Discharge Needs  Concerns to be addressed:  No discharge needs identified Readmission within the last 30 days:  No Current discharge risk:  None Barriers to Discharge:  No Barriers Identified   [place]illy [place], LCSW 06/15/2016, 2:22 PM

## 2016-06-15 NOTE — Care Management Note (Addendum)
Case Management Note  Patient Details  Name: Amber Holder MRN: [id number] Date of Birth: 04-17-1946  Subjective/Objective:                   Met with patient to discuss discharge planning. Patient states she "will do what ever Dr Marry Guan tells her to do". She agrees with SNF or HHPT/OT. She would like to use [facility] for home health. She states she will need a rolling walker. She use [facility] [phone] for medication needs. She lives alone. She has two sons that are local and work during the day. Action/Plan:   Home health list left with patient. Rolling walker and bedside commode [facility] from [facility] if patient is able to return home. Referral to [facility] for home health PT and OT. Lovenox 9m #14 called in to [facility]. RNCM to cancel if patient goes to SNF.    Expected Discharge Date:                  Expected Discharge Plan:     In-House Referral:     Discharge planning Services  CM Consult  Post Acute Care Choice:  Home Health, Durable Medical Equipment Choice offered to:  Patient  DME Arranged:  Walker rolling [place]ME [place]  [place], [place]  Status of Service:  In process, will continue to follow  If discussed at Long Length of Stay Meetings, dates discussed:    Additional Comments:  AMarshell Garfinkel RN 06/15/2016, 12:07 PM

## 2016-06-15 NOTE — Anesthesia Postprocedure Evaluation (Signed)
Anesthesia Post Note  Patient: Amber Holder  Procedure(s) Performed: Procedure(s) (LRB): TOTAL HIP ARTHROPLASTY (Right)  Patient location during evaluation: Nursing Unit Anesthesia Type: Spinal Level of consciousness: awake and alert Pain management: pain level controlled Vital Signs Assessment: post-procedure vital signs reviewed and stable Respiratory status: spontaneous breathing Cardiovascular status: stable Postop Assessment: no headache Anesthetic complications: no Comments: BP down during the night to 80/40, given fluid, up now    Last Vitals:  Vitals:   06/15/16 0614 06/15/16 0725  BP: (!) 94/39 (!) 90/38  Pulse: 62 62  Resp:  16  Temp:  36.5 C    Last Pain:  Vitals:   06/15/16 0725  TempSrc: Oral  PainSc:                  Buckner [place]

## 2016-06-15 NOTE — Progress Notes (Signed)
Physical Therapy Treatment Patient Details Name: Amber Holder MRN: [id number] DOB: 1946-08-05 Today's Date: 06/15/2016    History of Present Illness admitted for hospitalization status post R THR, posterior approach/WBAT, 06/14/16.    PT Comments    Pt. Able to progress mobility this session. Demonstrates sit<>stand transfers with RW minA, demonstrates safe technique, unsteady when transitioning UE s to RW  Pt. Able to increase ambulation distance to approx. 82ft. Min A with use of RW pt. Demonstrates decreased weight acceptance/stance time through RLE and decreased step height. Pt. Demonstrates ability to negotiate various surfaces and obstacles with RW min A. Performed bed mobility with verbal cues for technique min A for RLE negotiation. Able to perform supine RLE exercises, pt. Desires further exercises that she can perform on her own at next session. Would benefit from skilled PT to address above deficits and promote optimal return to PLOF Continue to recommend SNF for further skilled PT needs.   Follow Up Recommendations  SNF     Equipment Recommendations  Rolling walker with 5" wheels    Recommendations for Other Services       Precautions / Restrictions Precautions Precautions: Fall;Posterior Hip Restrictions Weight Bearing Restrictions: Yes RLE Weight Bearing: Weight bearing as tolerated    Mobility  Bed Mobility Overal bed mobility: Needs Assistance Bed Mobility: Sit to Supine       Sit to supine: Min assist   General bed mobility comments: pt. able to transition to flattened bed,min A for RLE management/support  Transfers Overall transfer level: Needs assistance Equipment used: Rolling walker (2 wheeled) Transfers: Sit to/from Stand Sit to Stand: Min assist         General transfer comment: RLE anterior to BOS, able to push from seating surface, slow transition through movement, min A when transitioning UE's from seat surface up to  RW  Ambulation/Gait Ambulation/Gait assistance: Min assist Ambulation Distance (Feet): 40 Feet Assistive device: Rolling walker (2 wheeled)       General Gait Details: Pt. demonstrates step to pattern with decreased stance time/weight acceptance, decreased RLE step height, cues for upright posture, relies on RW for UE support   Stairs            Wheelchair Mobility    Modified Rankin (Stroke Patients Only)       Balance Overall balance assessment: Needs assistance Sitting-balance support: Feet supported Sitting balance-Leahy Scale: Good     Standing balance support: Bilateral upper extremity supported Standing balance-Leahy Scale: Fair Standing balance comment: pt. requires at least single UE support in standing                     Cognition Arousal/Alertness: Awake/alert Behavior During Therapy: WFL for tasks assessed/performed Overall Cognitive Status: Within Functional Limits for tasks assessed                      Exercises Other Exercises Other Exercises: supine exercises for RLE AROM/strength; x10 glut sets, ankle pumps, quad sets. Pt. requested use of restroom during session, demosntrates ability to negotiate various surfaces and obstacles with RW, able to stand without UE support CGA for hand hygiene.    General Comments        Pertinent Vitals/Pain Pain Assessment: 0-10 Pain Score: 1  Pain Location: R hip  Pain Descriptors / Indicators:  (stiff) Pain Intervention(s): Monitored during session;Repositioned    Home Living  Prior Function            PT Goals (current goals can now be found in the care plan section) Acute Rehab PT Goals Patient Stated Goal: to return home PT Goal Formulation: With patient Time For Goal Achievement: 06/29/16 Potential to Achieve Goals: Good Progress towards PT goals: Progressing toward goals    Frequency    BID      PT Plan Current plan remains appropriate     Co-evaluation             End of Session Equipment Utilized During Treatment: Gait belt Activity Tolerance: Patient tolerated treatment well Patient left: in bed;with bed alarm set;with call bell/phone within reach;with SCD's reapplied (pillows placed to prevent adduction)     Time: [id number] PT Time Calculation (min) (ACUTE ONLY): 32 min  Charges:                       G Codes:       Melanie Crazier, SPT  06/15/16,4:40 PM

## 2016-06-15 NOTE — Progress Notes (Addendum)
  Subjective: 1 Day Post-Op Procedure(s) (LRB): TOTAL HIP ARTHROPLASTY (Right) Patient reports pain as mild.   Patient seen in rounds with Dr. Marry Guan. Patient is well, and has had no acute complaints or problems. Plan is to go Home after hospital stay. Negative for chest pain and shortness of breath Fever: no Gastrointestinal: Negative for nausea and vomiting  Objective: Vital signs in last 24 hours: Temp:  [96.5 F (35.8 C)-98.6 F (37 C)] 98.6 F (37 C) (10/19 0500) Pulse Rate:  [62-82] 62 (10/19 0614) Resp:  [10-19] 18 (10/19 0500) BP: (80-105)/(39-74) 94/39 (10/19 0614) SpO2:  [89 %-98 %] 94 % (10/19 0500)  Intake/Output from previous day:  Intake/Output Summary (Last 24 hours) at 06/15/16 0641 Last data filed at 06/15/16 0500  Gross per 24 hour  Intake             3095 ml  Output             2450 ml  Net              645 ml    Intake/Output this shift: Total I/O In: [id number] [P.O.:240; I.V.:1255; IV Piggyback:100] Out: 1100 [Urine:1100]  Labs:  Recent Labs  06/15/16 0545  HGB 10.5*    Recent Labs  06/15/16 0545  WBC 11.7*  RBC 3.56*  HCT 31.3*  PLT 158   No results for input(s): NA, K, CL, CO2, BUN, CREATININE, GLUCOSE, CALCIUM in the last 72 hours. No results for input(s): LABPT, INR in the last 72 hours.   EXAM General - Patient is Alert and Oriented Extremity - Sensation intact distally Dorsiflexion/Plantar flexion intact No cellulitis present Compartment soft Dressing/Incision - clean, dry, no drainage. The Hemovac is intact with 100 cc blood loss. Motor Function - intact, moving foot and toes well on exam. The physical therapy last night he comes of the block still affecting her lower extremities.  Past Medical History:  Diagnosis Date  . Arthritis   . Asthma   . Chronic kidney disease    Kidney stones  . COPD (chronic obstructive pulmonary disease) ([place])   . Diabetes mellitus without complication ([place])   . GERD (gastroesophageal reflux  disease)   . HOH (hard of hearing)    Bilateral hearing aids  . Osteoporosis   . Sleep apnea    Does not use C-PAP on a regular basis  . Venous stasis     Assessment/Plan: 1 Day Post-Op Procedure(s) (LRB): TOTAL HIP ARTHROPLASTY (Right) Active Problems:   S/P total hip arthroplasty  Estimated body mass index is 29.12 kg/m as calculated from the following:   Height as of this encounter: 5\' 5"  (1.651 m).   Weight as of this encounter: 79.4 kg (175 lb). Advance diet Up with therapy D/C IV fluids Plan for discharge tomorrow  Continue to follow blood pressure, as the patient is receiving a 500 cc bolus presently. Ordered by Dr. Roland Rack.  DVT Prophylaxis - Lovenox, Foot Pumps and TED hose Weight-Bearing as tolerated to right leg  Reche Dixon, PA-C Orthopaedic Surgery 06/15/2016, 6:41 AM

## 2016-06-15 NOTE — Evaluation (Signed)
Physical Therapy Evaluation Patient Details Name: Amber Holder MRN: [id number] DOB: Sep 05, 1945 Today's Date: 06/15/2016   History of Present Illness  admitted for hospitalization status post R THR, posterior approach/WBAT, 06/14/16.  Clinical Impression  Upon evaluation, patient alert and oriented; eager for OOB and participation as tolerated.  Very motivated.  Reports pain as 2/10, minimally changed with activity and WBing.  R LE strength and ROM grossly WFL post-op (strength at least 3-/5) with full return of sensation reported.  Patient generally guarded with movement of R hip, but able to complete all required movement with increased time.  Currently requiring mod assist for bed mobility; min assist for sit/stand, basic transfers and short-distance gait (4') with RW.  Decreased stance time/weight acceptance R LE. Would benefit from skilled PT to address above deficits and promote optimal return to PLOF; recommend transition to STR upon discharge from acute hospitalization.     Follow Up Recommendations SNF    Equipment Recommendations  Rolling walker with 5" wheels    Recommendations for Other Services       Precautions / Restrictions Precautions Precautions: Fall Restrictions Weight Bearing Restrictions: Yes RLE Weight Bearing: Weight bearing as tolerated      Mobility  Bed Mobility Overal bed mobility: Needs Assistance Bed Mobility: Supine to Sit     Supine to sit: Mod assist     General bed mobility comments: for LE management and truncal elevation  Transfers Overall transfer level: Needs assistance Equipment used: Rolling walker (2 wheeled) Transfers: Sit to/from Stand Sit to Stand: Min assist         General transfer comment: slow and guarded, cuing for hand placement  Ambulation/Gait Ambulation/Gait assistance: Min assist Ambulation Distance (Feet): 4 Feet Assistive device: Rolling walker (2 wheeled)       General Gait Details: step to gait  pattern with decreased stance time/weight acceptance R LE; min cuing for postural extension and upward gaze.  Tends to slide L LE across floor vs. step during limb advancement (due to limited weight acceptance R LE)  Stairs            Wheelchair Mobility    Modified Rankin (Stroke Patients Only)       Balance Overall balance assessment: Needs assistance Sitting-balance support: No upper extremity supported;Feet supported Sitting balance-Leahy Scale: Good     Standing balance support: Bilateral upper extremity supported Standing balance-Leahy Scale: Fair                               Pertinent Vitals/Pain Pain Assessment: 0-10 Pain Score: 2  Pain Location: R hip Pain Descriptors / Indicators: Aching Pain Intervention(s): Limited activity within patient's tolerance;Monitored during session;Premedicated before session;Repositioned    Home Living Family/patient expects to be discharged to:: Private residence Living Arrangements: Alone Available Help at Discharge: Family;Available PRN/intermittently Type of Home: House Home Access: Stairs to enter Entrance Stairs-Rails: Can reach both Entrance Stairs-Number of Steps: 4 Home Layout: One level Home Equipment: None      Prior Function Level of Independence: Independent         Comments: Indep with ADLs, household and community activities; denies recent fall history; + driving.     Hand Dominance        Extremity/Trunk Assessment   Upper Extremity Assessment: Overall WFL for tasks assessed           Lower Extremity Assessment: Generalized weakness (R LE grossly 3-/5, limited by pain; otherwise, WFL.  Sensation fully returned/intact)         Communication   Communication: HOH  Cognition Arousal/Alertness: Awake/alert Behavior During Therapy: WFL for tasks assessed/performed Overall Cognitive Status: Within Functional Limits for tasks assessed                      General  Comments      Exercises Other Exercises Other Exercises: Supine LE therex, 1x10, AROM for muscular strength/endurance with functional activities: ankle pumps, quad sets, SAQs, heel slides, hip abduct/adduct.   Assessment/Plan    PT Assessment Patient needs continued PT services  PT Problem List Decreased strength;Decreased range of motion;Decreased activity tolerance;Decreased balance;Decreased mobility;Decreased knowledge of use of DME;Decreased safety awareness;Decreased knowledge of precautions;Pain          PT Treatment Interventions DME instruction;Gait training;Stair training;Functional mobility training;Therapeutic activities;Therapeutic exercise;Balance training;Patient/family education    PT Goals (Current goals can be found in the Care Plan section)  Acute Rehab PT Goals Patient Stated Goal: to return home PT Goal Formulation: With patient Time For Goal Achievement: 06/29/16 Potential to Achieve Goals: Good    Frequency BID   Barriers to discharge Decreased caregiver support      Co-evaluation               End of Session Equipment Utilized During Treatment: Gait belt Activity Tolerance: Patient tolerated treatment well Patient left: in chair;with call bell/phone within reach;with chair alarm set           Time: [id number] PT Time Calculation (min) (ACUTE ONLY): 42 min   Charges:   PT Evaluation $PT Eval Low Complexity: 1 Procedure PT Treatments $Therapeutic Exercise: 8-22 mins   PT G Codes:        Nicodemus Denk H. Owens Shark, PT, DPT, NCS 06/15/16, 10:50 AM [phone]

## 2016-06-15 NOTE — Clinical Social Work Placement (Addendum)
   CLINICAL SOCIAL WORK PLACEMENT  NOTE  Date:  06/15/2016  Patient Details  Name: Amber Holder MRN: [id number] Date of Birth: [birth year]  Clinical Social Work is seeking post-discharge placement for this patient at the [place] level of care (*CSW will initial, date and re-position this form in  chart as items are completed):  Yes   Patient/family provided with [facility] Work Department's list of facilities offering this level of care within the geographic area requested by the patient (or if unable, by the patient's family).  Yes   Patient/family informed of their freedom to choose among providers that offer the needed level of care, that participate in Medicare, Medicaid or managed care program needed by the patient, have an available bed and are willing to accept the patient.  Yes   Patient/family informed of [facility]'s ownership interest in [facility] and [facility], as well as of the fact that they are under no obligation to receive care at these facilities.  PASRR submitted to EDS on 06/15/16     PASRR number received on 06/15/16     Existing PASRR number confirmed on       FL2 transmitted to all facilities in geographic area requested by pt/family on 06/15/16     FL2 transmitted to all facilities within larger geographic area on       Patient informed that his/her managed care company has contracts with or will negotiate with certain facilities, including the following:            Patient/family informed of bed offers received. 06/16/2016    Patient chooses bed at      [facility]  Physician recommends and patient chooses bed at      SNF Patient to be transferred to   on  .  06/17/16  Patient to be transferred to facility by      EMS  Patient family notified on   of transfer. Son  Name of family member notified:       SON by patient.  PHYSICIAN Please sign FL2     Additional Comment:     _______________________________________________ Lilly Cove, LCSW 06/15/2016, 2:21 PM

## 2016-06-15 NOTE — Progress Notes (Signed)
Patient BP low Dr. Roland Rack notified ordered 500 cc bolus.

## 2016-06-15 NOTE — NC FL2 (Signed)
[place] LEVEL OF CARE SCREENING TOOL     IDENTIFICATION  Patient Name: Amber Holder Birthdate: [birth year] Sex: female Admission Date (Current Location): 06/14/2016  [place] and [place] Number:  Engineering geologist and Address:  [facility], [address] [place], [place] [address]      Provider Number: [id number]  Attending Physician Name and Address:  Dereck Leep, MD  Relative Name and Phone Number:       Current Level of Care: Hospital Recommended Level of Care: [place] Prior Approval Number:    Date Approved/Denied:   PASRR Number:   [id number]  Discharge Plan: SNF    Current Diagnoses: Patient Active Problem List   Diagnosis Date Noted  . S/P total hip arthroplasty 06/14/2016    Orientation RESPIRATION BLADDER Height & Weight     Self, Time, Situation, Place  Normal Continent Weight: 175 lb (79.4 kg) Height:  5\' 5"  (165.1 cm)  BEHAVIORAL SYMPTOMS/MOOD NEUROLOGICAL BOWEL NUTRITION STATUS      Continent Diet (carb modified)  AMBULATORY STATUS COMMUNICATION OF NEEDS Skin   Limited Assist Verbally Surgical wounds, Skin abrasions            Can read lips as well,            Personal Care Assistance Level of Assistance  Bathing, Feeding, Dressing Bathing Assistance: Limited assistance Feeding assistance: Independent Dressing Assistance: Limited assistance     Functional Limitations Info  Sight, Hearing, Speech Sight Info: Adequate Hearing Info: Impaired Speech Info: Adequate    SPECIAL CARE FACTORS FREQUENCY  PT (By licensed PT), OT (By licensed OT)     PT Frequency: 5 OT Frequency: 5            Contractures Contractures Info: Not present    Additional Factors Info  Code Status, Allergies Code Status Info: Full Code Allergies Info: Shellfish Allergy, Sulfa Antibiotics, Percocet Oxycodone-acetaminophen           Current Medications (06/15/2016):  This is the current  hospital active medication list Current Facility-Administered Medications  Medication Dose Route Frequency Provider Last Rate Last Dose  . 0.9 %  sodium chloride infusion   Intravenous Continuous Dereck Leep, MD   Stopped at 06/15/16 1321  . acetaminophen (OFIRMEV) IV 1,000 mg  1,000 mg Intravenous Q6H Dereck Leep, MD   1,000 mg at 06/15/16 1224  . acetaminophen (TYLENOL) tablet 650 mg  650 mg Oral Q6H PRN Dereck Leep, MD       Or  . acetaminophen (TYLENOL) suppository 650 mg  650 mg Rectal Q6H PRN Dereck Leep, MD      . albuterol (PROVENTIL) (2.5 MG/3ML) 0.083% nebulizer solution 2.5 mg  2.5 mg Nebulization Q6H PRN Dereck Leep, MD      . alum & mag hydroxide-simeth (MAALOX/MYLANTA) 200-200-20 MG/5ML suspension 30 mL  30 mL Oral Q4H PRN Dereck Leep, MD      . bisacodyl (DULCOLAX) suppository 10 mg  10 mg Rectal Daily PRN Dereck Leep, MD      . cholecalciferol (VITAMIN D) tablet 2,000 Units  2,000 Units Oral Daily Dereck Leep, MD   2,000 Units at 06/15/16 1112  . diphenhydrAMINE (BENADRYL) 12.5 MG/5ML elixir 12.5-25 mg  12.5-25 mg Oral Q4H PRN Dereck Leep, MD      . enoxaparin (LOVENOX) injection 30 mg  30 mg Subcutaneous Q12H Dereck Leep, MD   30 mg at 06/15/16 0830  .  ferrous sulfate tablet 325 mg  325 mg Oral BID WC Dereck Leep, MD   325 mg at 06/15/16 [id number]  . furosemide (LASIX) tablet 20 mg  20 mg Oral Daily PRN Dereck Leep, MD      . insulin aspart (novoLOG) injection 0-15 Units  0-15 Units Subcutaneous TID WC Dereck Leep, MD   2 Units at 06/15/16 0830  . magnesium hydroxide (MILK OF MAGNESIA) suspension 30 mL  30 mL Oral Daily PRN Dereck Leep, MD   30 mL at 06/15/16 1310  . menthol-cetylpyridinium (CEPACOL) lozenge 3 mg  1 lozenge Oral PRN Dereck Leep, MD       Or  . phenol (CHLORASEPTIC) mouth spray 1 spray  1 spray Mouth/Throat PRN Dereck Leep, MD      . metFORMIN (GLUCOPHAGE) tablet 250 mg  250 mg Oral Q breakfast Dereck Leep, MD   250  mg at 06/15/16 [id number]  . metoCLOPramide (REGLAN) tablet 10 mg  10 mg Oral TID AC & HS Dereck Leep, MD   10 mg at 06/15/16 1224  . montelukast (SINGULAIR) tablet 10 mg  10 mg Oral QHS Dereck Leep, MD   10 mg at 06/14/16 2123  . morphine 2 MG/ML injection 2 mg  2 mg Intravenous Q2H PRN Dereck Leep, MD      . ondansetron (ZOFRAN) tablet 4 mg  4 mg Oral Q6H PRN Dereck Leep, MD       Or  . ondansetron (ZOFRAN) injection 4 mg  4 mg Intravenous Q6H PRN Dereck Leep, MD      . pantoprazole (PROTONIX) EC tablet 40 mg  40 mg Oral BID Dereck Leep, MD   40 mg at 06/15/16 1111  . polyvinyl alcohol (LIQUIFILM TEARS) 1.4 % ophthalmic solution 1 drop  1 drop Both Eyes PRN Dereck Leep, MD      . potassium chloride (KLOR-CON) CR tablet 8 mEq  8 mEq Oral PRN Dereck Leep, MD      . senna-docusate (Senokot-S) tablet 1 tablet  1 tablet Oral BID Dereck Leep, MD   1 tablet at 06/15/16 1111  . sodium phosphate (FLEET) 7-19 GM/118ML enema 1 enema  1 enema Rectal Once PRN Dereck Leep, MD      . tapentadol (NUCYNTA) tablet 50-100 mg  50-100 mg Oral Q4H PRN Dereck Leep, MD   100 mg at 06/15/16 0853  . tiotropium (SPIRIVA) inhalation capsule 18 mcg  18 mcg Inhalation Daily Dereck Leep, MD   18 mcg at 06/15/16 1112  . traMADol (ULTRAM) tablet 50-100 mg  50-100 mg Oral Q4H PRN Dereck Leep, MD   50 mg at 06/14/16 2230     Discharge Medications: Please see discharge summary for a list of discharge medications.  Relevant Imaging Results:  Relevant Lab Results:   Additional Information SSN:  [id number]  Lilly Cove, [place]

## 2016-06-16 ENCOUNTER — Encounter
Admission: RE | Admit: 2016-06-16 | Discharge: 2016-06-16 | Disposition: A | Discharge status: Home / Self Care | Payer: Medicare Other | Source: Ambulatory Visit | Attending: Internal Medicine | Admitting: Internal Medicine

## 2016-06-16 LAB — BASIC METABOLIC PANEL
Anion gap: 5 (ref 5–15)
BUN: 22 mg/dL — AB (ref 6–20)
CALCIUM: 8.1 mg/dL — AB (ref 8.9–10.3)
CO2: 25 mmol/L (ref 22–32)
CREATININE: 0.57 mg/dL (ref 0.44–1.00)
Chloride: 109 mmol/L (ref 101–111)
GFR calc Af Amer: >60 mL/min (ref >60)
Glucose, Bld: 99 mg/dL (ref 65–99)
Potassium: 3.9 mmol/L (ref 3.5–5.1)
SODIUM: 139 mmol/L (ref 135–145)

## 2016-06-16 LAB — CBC
HCT: 30.9 % — ABNORMAL LOW (ref 35.0–47.0)
Hemoglobin: 10.5 g/dL — ABNORMAL LOW (ref 12.0–16.0)
MCH: 30.4 pg (ref 26.0–34.0)
MCHC: 33.9 g/dL (ref 32.0–36.0)
MCV: 89.7 fL (ref 80.0–100.0)
PLATELETS: 151 10*3/uL (ref 150–440)
RBC: 3.44 MIL/uL — AB (ref 3.80–5.20)
RDW: 13.8 % (ref 11.5–14.5)
WBC: 10.6 10*3/uL (ref 3.6–11.0)

## 2016-06-16 LAB — GLUCOSE, CAPILLARY
GLUCOSE-CAPILLARY: 115 mg/dL — AB (ref 65–99)
Glucose-Capillary: 95 mg/dL (ref 65–99)
Glucose-Capillary: 103 mg/dL — ABNORMAL HIGH (ref 65–99)
Glucose-Capillary: 108 mg/dL — ABNORMAL HIGH (ref 65–99)

## 2016-06-16 LAB — SURGICAL PATHOLOGY

## 2016-06-16 MED ORDER — TAPENTADOL HCL 50 MG PO TABS: 50.0000 mg | ORAL_TABLET | ORAL | 0 refills | Status: DC | PRN

## 2016-06-16 MED ORDER — TRAMADOL HCL 50 MG PO TABS: 50.0000 mg | ORAL_TABLET | ORAL | 0 refills | Status: DC | PRN

## 2016-06-16 MED ORDER — MELOXICAM 7.5 MG PO TABS: 7.5000 mg | ORAL_TABLET | ORAL | 1 refills | Status: DC | PRN

## 2016-06-16 MED ORDER — ENOXAPARIN SODIUM 40 MG/0.4ML ~~LOC~~ SOLN: 40.0000 mg | SUBCUTANEOUS | 0 refills | Status: DC

## 2016-06-16 NOTE — Progress Notes (Signed)
Physical Therapy Treatment Patient Details Name: Amber Holder MRN: [id number] DOB: 12/07/1945 Today's Date: 06/16/2016    History of Present Illness admitted for hospitalization status post R THR, posterior approach/WBAT, 06/14/16.    PT Comments    Pt. Sleeping in bed upon arrival, able to arouse with verbalization of name. Pt. Able to progress mobility this afternoon. Still requires min A for supine to sit and mod A for sit to supine for RLE and BLE support/negotiation. Pt. Able to perform sit<>Stand transfers with CGA with use of RW demonstrating L lateral lean and slow movement transition. Able to progress ambulation distance to approx. 117ft. CGA with use of RW demonstrates step to pattern able to progress to step through with vc's, noted unsteadiness with RW when performing head turns or with distractions able to self correct. Pt. Was also able to participate in standing LE exercises to facilitate RLE weight acceptance. Would benefit from skilled PT to address above deficits and promote optimal return to PLOF Recommend SNF placement upon d/c to follow up with further skilled PT needs.   Follow Up Recommendations  SNF     Equipment Recommendations  Rolling walker with 5" wheels    Recommendations for Other Services       Precautions / Restrictions Precautions Precautions: Fall;Posterior Hip Restrictions Weight Bearing Restrictions: Yes RLE Weight Bearing: Weight bearing as tolerated    Mobility  Bed Mobility Overal bed mobility: Needs Assistance Bed Mobility: Supine to Sit;Sit to Supine     Supine to sit: Min assist Sit to supine: Mod assist   General bed mobility comments: Pt. requires vc's for instruction of optimal bed mobility technique, requires A for RLE support/management with supine to sit and requires B LE support/management when returning to supine from sit  Transfers Overall transfer level: Needs assistance Equipment used: Rolling walker (2  wheeled) Transfers: Sit to/from Stand Sit to Stand: Min guard         General transfer comment: RLE anterior to BOS, demonstrates L lateral lean in sitting and throughout transfer able to demonstrate safe technique.  Ambulation/Gait Ambulation/Gait assistance: Min guard Ambulation Distance (Feet): 100 Feet Assistive device: Rolling walker (2 wheeled)       General Gait Details: Pt. demonstrates step to pattern leading with RLE, able to progress to step through pattern with vc's and increased ambulation distance. demonstrates decerased weight acceptance/stance time through RLE. Pt. demonstrates instability with RW when performing head turns while ambulating, able to self correct   Stairs            Wheelchair Mobility    Modified Rankin (Stroke Patients Only)       Balance Overall balance assessment: Needs assistance Sitting-balance support: Feet supported Sitting balance-Leahy Scale: Good Sitting balance - Comments: demonstrates L lateral lean in sitting   Standing balance support: Bilateral upper extremity supported Standing balance-Leahy Scale: Fair Standing balance comment: Pt. requires at least single UE support in standing                    Cognition Arousal/Alertness: Awake/alert Behavior During Therapy: WFL for tasks assessed/performed Overall Cognitive Status: Within Functional Limits for tasks assessed                      Exercises Other Exercises Other Exercises: Pt. requested use of restroom during session; able to negotiate various surface/obstacle with RW CGA, able to sit<>stand from various surfaces, perform static standing with single UE support CGA. Performed standing exercises; split  stance RLE anterior to LLE weight shifting with RW CGA, LLE step forward/backward in place.     General Comments        Pertinent Vitals/Pain Pain Assessment: 0-10 Pain Score: 3  Pain Location: R hip Pain Intervention(s): Monitored during  session;Repositioned;Premedicated before session    Home Living                      Prior Function            PT Goals (current goals can now be found in the care plan section) Acute Rehab PT Goals Patient Stated Goal: to return home PT Goal Formulation: With patient Time For Goal Achievement: 06/29/16 Potential to Achieve Goals: Good Progress towards PT goals: Progressing toward goals    Frequency    BID      PT Plan Current plan remains appropriate    Co-evaluation             End of Session Equipment Utilized During Treatment: Gait belt;Oxygen Activity Tolerance: Patient tolerated treatment well Patient left: in bed;with bed alarm set;with family/visitor present;with SCD's reapplied (heel elevated and pillow placed to prevent adduction)     Time: [id number] PT Time Calculation (min) (ACUTE ONLY): 30 min  Charges:                       G Codes:      Melanie Crazier, SPT  07/16/16,4:45 PM

## 2016-06-16 NOTE — Progress Notes (Signed)
Physical Therapy Treatment Patient Details Name: Amber Holder MRN: [id number] DOB: September 04, 1945 Today's Date: 06/16/2016    History of Present Illness admitted for hospitalization status post R THR, posterior approach/WBAT, 06/14/16.    PT Comments    Pt. Sitting in chair upon arrival, motivated to progress activity today. spO2 monitored throughout session, pt. 87% on room air, improved to 91% on 3L O2 at rest, 3L O2 utilized throughout session spO2 91% with activity and [place] donned, nsg informed. Pt. Able to perform sit<>stand transfers min A with use of RW still demonstrating guarding of R hip and unsteadiness without UE support in standing. Pt. Able to progress ambulation distance today performing approx. 47ft. Of gait RW CGA demonstrating slowed cadence, step to pattern and decreased weight acceptance/stance time through RLE. Pt. Able to perform chair exercises, given handout to perform on her own. Would benefit from skilled PT to address above deficits and promote optimal return to PLOF Continue to recommend SNF placement upon d/c will continue to progress mobility and update recommendations as appropriate.   Follow Up Recommendations  SNF     Equipment Recommendations  Rolling walker with 5" wheels    Recommendations for Other Services       Precautions / Restrictions Precautions Precautions: Fall;Posterior Hip Restrictions Weight Bearing Restrictions: Yes RLE Weight Bearing: Weight bearing as tolerated    Mobility  Bed Mobility Overal bed mobility:  (Pt. in chair upon arrival, returned to chair )                Transfers Overall transfer level: Needs assistance Equipment used: Rolling walker (2 wheeled) Transfers: Sit to/from Stand Sit to Stand: Min assist         General transfer comment: RLE anterior to BOS, demonstrates safe technique, remains unsteady when transitioning UE's to RW, demonstrates L lateral lean/R hip guarding through transition of  movement  Ambulation/Gait Ambulation/Gait assistance: Min guard Ambulation Distance (Feet): 75 Feet Assistive device: Rolling walker (2 wheeled)       General Gait Details: Pt. demonstrates slowed cadence with step to pattern leading with RLE, pt. demonstrates decreased weight aceptance/stance time through RLE, able to obtain upright positioning with vc's   Stairs            Wheelchair Mobility    Modified Rankin (Stroke Patients Only)       Balance Overall balance assessment: Needs assistance Sitting-balance support: Feet supported Sitting balance-Leahy Scale: Good Sitting balance - Comments: Pt. demonstrates L lateral lean in sitting/guarding of R hip   Standing balance support: Bilateral upper extremity supported Standing balance-Leahy Scale: Fair                      Cognition Arousal/Alertness: Awake/alert Behavior During Therapy: WFL for tasks assessed/performed Overall Cognitive Status: Within Functional Limits for tasks assessed                      Exercises Other Exercises Other Exercises: Pt. able to perform exercises sitting in chair for RLE x10; ankle pumps, quad sets, hip ab/add AAROM, SAQ, glut sets    General Comments        Pertinent Vitals/Pain Pain Assessment: 0-10 Pain Score: 2  Pain Location: R hip at rest  Pain Intervention(s): Monitored during session;Repositioned;Premedicated before session    [place] expects to be discharged to:: Private residence                    Prior  Function            PT Goals (current goals can now be found in the care plan section) Acute Rehab PT Goals Patient Stated Goal: to return home PT Goal Formulation: With patient Time For Goal Achievement: 06/29/16 Potential to Achieve Goals: Good Progress towards PT goals: Progressing toward goals    Frequency    BID      PT Plan Current plan remains appropriate    Co-evaluation             End  of Session Equipment Utilized During Treatment: Gait belt;Oxygen Activity Tolerance: Patient tolerated treatment well Patient left: in chair;with chair alarm set;with call bell/phone within reach (pillow placed to prevent adduction)     Time: [id number] PT Time Calculation (min) (ACUTE ONLY): 33 min  Charges:                       G Codes:       Melanie Crazier, SPT  06/16/16,1:09 PM

## 2016-06-16 NOTE — Discharge Instructions (Signed)
POSTERIOR TOTAL HIP REPLACEMENT POSTOPERATIVE DIRECTIONS ° °Hip Rehabilitation, Guidelines Following Surgery  °The results of a hip operation are greatly improved after range of motion and muscle strengthening exercises. Follow all safety measures which are given to protect your hip. If any of these exercises cause increased pain or swelling in your joint, decrease the amount until you are comfortable again. Then slowly increase the exercises. Call your caregiver if you have problems or questions.  ° °HOME CARE INSTRUCTIONS  °Remove items at home which could result in a fall. This includes throw rugs or furniture in walking pathways.  °· ICE to the affected hip every three hours for 30 minutes at a time and then as needed for pain and swelling.  Continue to use ice on the hip for pain and swelling from surgery. You may notice swelling that will progress down to the foot and ankle.  This is normal after surgery.  Elevate the leg when you are not up walking on it.   °· Continue to use the breathing machine which will help keep your temperature down.  It is common for your temperature to cycle up and down following surgery, especially at night when you are not up moving around and exerting yourself.  The breathing machine keeps your lungs expanded and your temperature down. ° °DIET °You may resume your previous home diet once your are discharged from the hospital. ° °DRESSING / WOUND CARE / SHOWERING °Keep the surgical dressing until follow up.  The dressing is water proof, so you can shower without any extra covering.  IF THE DRESSING FALLS OFF or the wound gets wet inside, change the dressing with sterile gauze.  Please use good hand washing techniques before changing the dressing.  Do not use any lotions or creams on the incision until instructed by your surgeon.   °You need to keep your dressing dry after discharge.   °Change the surgical dressing if needed with Physical Therapy and reapply a dry dressing each  time. ° ° ° °ACTIVITY °Walk with your walker as instructed. °Use walker as long as suggested by your caregivers. °Avoid periods of inactivity such as sitting longer than an hour when not asleep. This helps prevent blood clots.  °You may resume a sexual relationship in one month or when given the OK by your doctor.  °You may return to work once you are cleared by your doctor.  °Do not drive a car for 6 weeks or until released by you surgeon.  °Do not drive while taking narcotics. ° °WEIGHT BEARING °Weight bearing as tolerated with assist device (walker, cane, etc) as directed, use it as long as suggested by your surgeon or therapist, typically at least 4-6 weeks. ° °POSTOPERATIVE CONSTIPATION PROTOCOL °Constipation - defined medically as fewer than three stools per week and severe constipation as less than one stool per week. ° °One of the most common issues patients have following surgery is constipation.  Even if you have a regular bowel pattern at home, your normal regimen is likely to be disrupted due to multiple reasons following surgery.  Combination of anesthesia, postoperative narcotics, change in appetite and fluid intake all can affect your bowels.  In order to avoid complications following surgery, here are some recommendations in order to help you during your recovery period. ° °Colace (docusate) - Pick up an over-the-counter form of Colace or another stool softener and take twice a day as long as you are requiring postoperative pain medications.  Take with a   full glass of water daily.  If you experience loose stools or diarrhea, hold the colace until you stool forms back up.  If your symptoms do not get better within 1 week or if they get worse, check with your doctor. ° °Dulcolax (bisacodyl) - Pick up over-the-counter and take as directed by the product packaging as needed to assist with the movement of your bowels.  Take with a full glass of water.  Use this product as needed if not relieved by Colace  only.  ° °MiraLax (polyethylene glycol) - Pick up over-the-counter to have on hand.  MiraLax is a solution that will increase the amount of water in your bowels to assist with bowel movements.  Take as directed and can mix with a glass of water, juice, soda, coffee, or tea.  Take if you go more than two days without a movement. °Do not use MiraLax more than once per day. Call your doctor if you are still constipated or irregular after using this medication for 7 days in a row. ° °If you continue to have problems with postoperative constipation, please contact the office for further assistance and recommendations.  If you experience "the worst abdominal pain ever" or develop nausea or vomiting, please contact the office immediatly for further recommendations for treatment. ° °ITCHING ° If you experience itching with your medications, try taking only a single pain pill, or even half a pain pill at a time.  You can also use Benadryl over the counter for itching or also to help with sleep.  ° °TED HOSE STOCKINGS °Wear the elastic stockings on both legs for three weeks following surgery during the day but you may remove then at night for sleeping. ° °MEDICATIONS °See your medication summary on the “After Visit Summary” that the nursing staff will review with you prior to discharge.  You may have some home medications which will be placed on hold until you complete the course of blood thinner medication.  It is important for you to complete the blood thinner medication as prescribed by your surgeon.  Continue your approved medications as instructed at time of discharge. ° °PRECAUTIONS °If you experience chest pain or shortness of breath - call 911 immediately for transfer to the hospital emergency department.  °If you develop a fever greater that 101 F, purulent drainage from wound, increased redness or drainage from wound, foul odor from the wound/dressing, or calf pain - CONTACT YOUR SURGEON.   °                                                 °FOLLOW-UP APPOINTMENTS °Make sure you keep all of your appointments after your operation with your surgeon and caregivers. You should call the office at the above phone number and make an appointment for approximately two weeks after the date of your surgery or on the date instructed by your surgeon outlined in the "After Visit Summary". ° °RANGE OF MOTION AND STRENGTHENING EXERCISES  °These exercises are designed to help you keep full movement of your hip joint. Follow your caregiver's or physical therapist's instructions. Perform all exercises about fifteen times, three times per day or as directed. Exercise both hips, even if you have had only one joint replacement. These exercises can be done on a training (exercise) mat, on the floor, on a table or on a   bed. Use whatever works the best and is most comfortable for you. Use music or television while you are exercising so that the exercises are a pleasant break in your day. This will make your life better with the exercises acting as a break in routine you can look forward to.  °Lying on your back, slowly slide your foot toward your buttocks, raising your knee up off the floor. Then slowly slide your foot back down until your leg is straight again.  °Lying on your back spread your legs as far apart as you can without causing discomfort.  °Lying on your side, raise your upper leg and foot straight up from the floor as far as is comfortable. Slowly lower the leg and repeat.  °Lying on your back, tighten up the muscle in the front of your thigh (quadriceps muscles). You can do this by keeping your leg straight and trying to raise your heel off the floor. This helps strengthen the largest muscle supporting your knee.  °Lying on your back, tighten up the muscles of your buttocks both with the legs straight and with the knee bent at a comfortable angle while keeping your heel on the floor.  ° ° ° ° °IF YOU ARE TRANSFERRED TO A SKILLED REHAB  FACILITY °If the patient is transferred to a skilled rehab facility following release from the hospital, a list of the current medications will be sent to the facility for the patient to continue.  When discharged from the skilled rehab facility, please have the facility set up the patient's Home Health Physical Therapy prior to being released. Also, the skilled facility will be responsible for providing the patient with their medications at time of release from the facility to include their pain medication, the muscle relaxants, and their blood thinner medication. If the patient is still at the rehab facility at time of the two week follow up appointment, the skilled rehab facility will also need to assist the patient in arranging follow up appointment in our office and any transportation needs. ° °MAKE SURE YOU:  °Understand these instructions.  °Get help right away if you are not doing well or get worse.  ° ° °Pick up stool softner and laxative for home use following surgery while on pain medications. °Do not submerge incision under water. °Please use good hand washing techniques while changing dressing each day. °May shower starting three days after surgery. °Please use a clean towel to pat the incision dry following showers. °Continue to use ice for pain and swelling after surgery. °Do not use any lotions or creams on the incision until instructed by your surgeon. °

## 2016-06-16 NOTE — Care Management (Addendum)
Lovenox $10. Rolling walker and bedside commode requested from [facility] however they will need to pick these up if patient goes to SNF. RNCM to cancel Lovenox order with outpatient pharmacy too. [facility] updated that patient may need SNF.

## 2016-06-16 NOTE — Progress Notes (Signed)
Occupational Therapy Treatment Patient Details Name: Amber Holder MRN: [id number] DOB: 29-May-1946 Today's Date: 06/16/2016    History of present illness Pt. is a 70 y.o. female who was admitted to [facility] for a Right THR   OT comments  Pt. education was provided about A/E use for LE ADLs. Pt. wears compression hose at home. Pt. was educated about hip precautions, energy conservation/work simplifcation techniques/strategies, and was provided with a visual handout. Pt. continues to benefit from skilled OT services to work on improving A/E use for ADLs, and IADLs. Pt. could benefit from education about home modification, and DME.      Follow Up Recommendations  SNF    Equipment Recommendations       Recommendations for Other Services      Precautions / Restrictions Precautions Precautions: Fall;Posterior Hip Restrictions Weight Bearing Restrictions: Yes RLE Weight Bearing: Weight bearing as tolerated              ADL Overall ADL's : Needs assistance/impaired                                       General ADL Comments: Pt. education was provided about A/E use for LE ADLs, posterior hip precautions, and energy conservation techniques using an visual handout.      Vision                     Perception     Praxis      Cognition   Behavior During Therapy: WFL for tasks assessed/performed Overall Cognitive Status: Within Functional Limits for tasks assessed                       Extremity/Trunk Assessment               Exercises   Shoulder Instructions       General Comments      Pertinent Vitals/ Pain       Pain Assessment: No/denies pain Pain Score: 0-No pain Pain Location: RIght Hip Pain Intervention(s): Monitored during session;Repositioned;Premedicated before session  Home Living                                          Prior Functioning/Environment              Frequency  Min 1X/week         Progress Toward Goals  OT Goals(current goals can now be found in the care plan section)     Acute Rehab OT Goals Patient Stated Goal: to return home  Plan      Co-evaluation                 End of Session     Activity Tolerance Patient tolerated treatment well   Patient Left in chair;with call bell/phone within reach;with chair alarm set   Nurse Communication          Time: [id number] OT Time Calculation (min): 20 min  Charges: OT General Charges $OT Visit: 1 Procedure OT Evaluation $OT Eval Moderate Complexity: 1 Procedure  Harrel Carina, MS, OTR/L 06/16/2016, 4:32 PM

## 2016-06-16 NOTE — Care Management (Signed)
Patient has not medically met criteria to return home per PT. Order to stop discharge as she is not safe at this time for discharge. CSW updated.  Lovenox $10. Rolling walker and bedside commode requested from [facility] however they will need to pick these up if patient goes to SNF. RNCM to cancel Lovenox order with outpatient pharmacy too. [facility] updated that patient may need SNF.

## 2016-06-16 NOTE — Progress Notes (Signed)
Patient has been presented bed offers and has chosen [facility]. Sharyn Lull has been notified of discharge and bed choice. Agreeable to plan for weekend discharge. DC summary and SNF packet sent [facility].  Patient going to [facility]  Report #:  [phone]  LCSW on weekend to call admission in the AM and confirm discharge for AM Michelle: [phone]   No other needs at this time. MD aware of plan and has also spoken to patient and in agreement.  Lane Hacker, MSW Clinical Social Work: [employer]

## 2016-06-16 NOTE — Progress Notes (Signed)
  Subjective: 2 Days Post-Op Procedure(s) (LRB): TOTAL HIP ARTHROPLASTY (Right) Patient reports pain as moderate.  She has more pain with moving. Patient seen in rounds with Dr. Marry Guan. Patient is well, and has had no acute complaints or problems. Plan is to go to rehabilitation after hospital stay. Negative for chest pain and shortness of breath Fever: no Gastrointestinal: Negative for nausea and vomiting  Objective: Vital signs in last 24 hours: Temp:  [97.7 F (36.5 C)-98.8 F (37.1 C)] 98.8 F (37.1 C) (10/20 0518) Pulse Rate:  [62-80] 80 (10/20 0518) Resp:  [16-19] 18 (10/20 0518) BP: (72-122)/(38-63) 110/43 (10/20 0518) SpO2:  [90 %-97 %] 94 % (10/20 0518)  Intake/Output from previous day:  Intake/Output Summary (Last 24 hours) at 06/16/16 0636 Last data filed at 06/16/16 0538  Gross per 24 hour  Intake             1155 ml  Output             1100 ml  Net               55 ml    Intake/Output this shift: Total I/O In: -  Out: 390 [Urine:350; Drains:40]  Labs:  Recent Labs  06/15/16 0545 06/16/16 0403  HGB 10.5* 10.5*    Recent Labs  06/15/16 0545 06/16/16 0403  WBC 11.7* 10.6  RBC 3.56* 3.44*  HCT 31.3* 30.9*  PLT 158 151    Recent Labs  06/15/16 0545 06/16/16 0403  NA 138 139  K 3.9 3.9  CL 112* 109  CO2 22 25  BUN 16 22*  CREATININE 0.51 0.57  GLUCOSE 130* 99  CALCIUM 8.2* 8.1*   No results for input(s): LABPT, INR in the last 72 hours.   EXAM General - Patient is Alert and Oriented Extremity - Sensation intact distally Dorsiflexion/Plantar flexion intact No cellulitis present Compartment soft Dressing/Incision - clean, dry, no drainage. The Hemovac was removed with no complication. Motor Function - intact, moving foot and toes well on exam. The patient ambulated 40 feet with physical therapy with some difficulty with transfers.  Past Medical History:  Diagnosis Date  . Arthritis   . Asthma   . Chronic kidney disease    Kidney stones  . COPD (chronic obstructive pulmonary disease) ([place])   . Diabetes mellitus without complication ([place])   . GERD (gastroesophageal reflux disease)   . HOH (hard of hearing)    Bilateral hearing aids  . Osteoporosis   . Sleep apnea    Does not use C-PAP on a regular basis  . Venous stasis     Assessment/Plan: 2 Days Post-Op Procedure(s) (LRB): TOTAL HIP ARTHROPLASTY (Right) Active Problems:   S/P total hip arthroplasty  Estimated body mass index is 29.12 kg/m as calculated from the following:   Height as of this encounter: 5\' 5"  (1.651 m).   Weight as of this encounter: 79.4 kg (175 lb). Advance diet Up with therapy D/C IV fluids Plan for discharge tomorrow or possibly today. The discharge instructions will be completed. Continue to follow blood pressure, as it slowly increases. The patient will follow-up in 6 weeks with Dr. Marry Guan   DVT Prophylaxis - Lovenox, Foot Pumps and TED hose Weight-Bearing as tolerated to right leg  Reche Dixon, PA-C Orthopaedic Surgery 06/16/2016, 6:36 AM

## 2016-06-16 NOTE — Discharge Summary (Addendum)
Physician Discharge Summary  Subjective: 3 Days Post-Op Procedure(s) (LRB): TOTAL HIP ARTHROPLASTY (Right) Patient reports pain as moderate.   Patient seen in rounds with Dr. Marry Guan. Patient is well, and has had no acute complaints or problems. She does have some difficulty still transferring in the bed, because of pain. Patient is ready to go to rehabilitation for physical therapy  Physician Discharge Summary  Patient ID: Amber Holder MRN: [id number] DOB/AGE: 07/25/1946 70 y.o.  Admit date: 06/14/2016 Discharge date: 06/17/2016  Admission Diagnoses:  Discharge Diagnoses:  Active Problems:   S/P total hip arthroplasty   Discharged Condition: fair  Hospital Course: The patient is postop day 2 from a right posterior approach total hip replacement with Dr. Marry Guan. She has done well since surgery. She did have hypotension initially after surgery and was treated with a 500 cc bolus as her blood pressure increased from initial 72/59 and is up to 114/76. She is doing physical therapy and has ambulated 100 feet. She still has a lot of pain with getting up from bed and initially transitioning. She will be going to rehabilitation for physical therapy.  Treatments: surgery:  PROCEDURE:  Right total hip arthroplasty  SURGEON:  Marciano Sequin. M.D.  ASSISTANT:  Vance Peper, PA (present and scrubbed throughout the case, critical for assistance with exposure, retraction, instrumentation, and closure)  ANESTHESIA: spinal  ESTIMATED BLOOD LOSS: 50 mL  FLUIDS REPLACED: 1700 mL of crystalloid  DRAINS: 2 medium drains to a Hemovac reservoir  IMPLANTS UTILIZED: DePuy 13.5 mm small stature AML femoral stem, 52 mm OD Pinnacle 100 acetabular component, neutral Pinnacle Altrx polyethylene insert, and a 36 mm M-SPEC +1.5 mm hip ball  Discharge Exam: Blood pressure 114/76, pulse 90, temperature 98.8 F (37.1 C), temperature source Oral, resp. rate 16, height 5\' 5"  (1.651 m), weight 79.4  kg (175 lb), SpO2 92 %.   Disposition: Rehabilitation    Medication List    TAKE these medications   acetaminophen 500 MG chewable tablet Commonly known as:  TYLENOL Chew 1,000 mg by mouth every 8 (eight) hours as needed for pain.   albuterol 108 (90 Base) MCG/ACT inhaler Commonly known as:  PROVENTIL HFA;VENTOLIN HFA Inhale 2 puffs into the lungs every 6 (six) hours as needed for wheezing or shortness of breath.   cholecalciferol 400 units Tabs tablet Commonly known as:  VITAMIN D Take 2,000 Units by mouth daily.   enoxaparin 40 MG/0.4ML injection Commonly known as:  LOVENOX Inject 0.4 mLs (40 mg total) into the skin daily.   furosemide 20 MG tablet Commonly known as:  LASIX Take 20 mg by mouth as needed.   GENTEAL 0.25-0.3 % Gel Generic drug:  Carboxymethylcell-Hypromellose Apply 1 drop to eye as needed.   ibandronate 150 MG tablet Commonly known as:  BONIVA Take 150 mg by mouth every 30 (thirty) days. Take in the morning with a full glass of water, on an empty stomach, and do not take anything else by mouth or lie down for the next 30 min.   meloxicam 7.5 MG tablet Commonly known as:  MOBIC Take 1 tablet (7.5 mg total) by mouth as needed for pain.   metFORMIN 500 MG tablet Commonly known as:  GLUCOPHAGE Take 250 mg by mouth daily with breakfast.   montelukast 10 MG tablet Commonly known as:  SINGULAIR Take 10 mg by mouth as needed.   omega-3 acid ethyl esters 1 g capsule Commonly known as:  LOVAZA Take by mouth daily.   potassium  chloride 8 MEQ tablet Commonly known as:  KLOR-CON Take 8 mEq by mouth as needed (only take when taking Lasix).   tapentadol 50 MG tablet Commonly known as:  NUCYNTA Take 1-2 tablets (50-100 mg total) by mouth every 4 (four) hours as needed for moderate pain.   tiotropium 18 MCG inhalation capsule Commonly known as:  SPIRIVA Place 18 mcg into inhaler and inhale daily.   traMADol 50 MG tablet Commonly known as:   ULTRAM Take 1-2 tablets (50-100 mg total) by mouth every 4 (four) hours as needed for moderate pain.       Contact information for follow-up providers    Dereck Leep, MD Follow up on 07/25/2016.   Specialty:  Orthopedic Surgery Why:  at 2:00pm Contact information: [address] [facility] [place]  [address] [phone]            Contact information for after-discharge care    Destination    [facility] .   Specialty:  [place] information: [address] [place] [id number]                  Signed: Prescott Parma, Khyran Riera 06/17/2016, 6:49 AM   Objective: Vital signs in last 24 hours: Temp:  [97.7 F (36.5 C)-99.2 F (37.3 C)] 98.8 F (37.1 C) (10/21 0336) Pulse Rate:  [78-90] 90 (10/21 0336) Resp:  [16-18] 16 (10/21 0336) BP: (91-117)/(41-76) 114/76 (10/21 0336) SpO2:  [92 %-98 %] 92 % (10/21 0336)  Intake/Output from previous day:  Intake/Output Summary (Last 24 hours) at 06/17/16 0649 Last data filed at 06/17/16 0500  Gross per 24 hour  Intake              120 ml  Output             1425 ml  Net            -1305 ml    Intake/Output this shift: Total I/O In: -  Out: 1100 [Urine:1100]  Labs:  Recent Labs  06/15/16 0545 06/16/16 0403  HGB 10.5* 10.5*    Recent Labs  06/15/16 0545 06/16/16 0403  WBC 11.7* 10.6  RBC 3.56* 3.44*  HCT 31.3* 30.9*  PLT 158 151    Recent Labs  06/15/16 0545 06/16/16 0403  NA 138 139  K 3.9 3.9  CL 112* 109  CO2 22 25  BUN 16 22*  CREATININE 0.51 0.57  GLUCOSE 130* 99  CALCIUM 8.2* 8.1*   No results for input(s): LABPT, INR in the last 72 hours.  EXAM: General - Patient is Alert and Oriented Extremity - Neurovascular intact Dorsiflexion/Plantar flexion intact No cellulitis present Compartment soft Incision - clean, dry, no drainage, Motor Function -  the patient ambulated 100 feet with physical therapy. She  is able to plantarflex and dorsiflex her right foot.  Assessment/Plan: 3 Days Post-Op Procedure(s) (LRB): TOTAL HIP ARTHROPLASTY (Right) Procedure(s) (LRB): TOTAL HIP ARTHROPLASTY (Right) Past Medical History:  Diagnosis Date  . Arthritis   . Asthma   . Chronic kidney disease    Kidney stones  . COPD (chronic obstructive pulmonary disease) ([place])   . Diabetes mellitus without complication ([place])   . GERD (gastroesophageal reflux disease)   . HOH (hard of hearing)    Bilateral hearing aids  . Osteoporosis   . Sleep apnea    Does not use C-PAP on a regular basis  . Venous stasis    Active Problems:   S/P  total hip arthroplasty  Estimated body mass index is 29.12 kg/m as calculated from the following:   Height as of this encounter: 5\' 5"  (1.651 m).   Weight as of this encounter: 79.4 kg (175 lb). Discharge to SNF, Today. Diet - Diabetic diet Follow up - in 6 weeks Activity - WBAT Disposition - Rehab Condition Upon Discharge - Stable DVT Prophylaxis - Lovenox and support stockings.  Reche Dixon, PA-C Orthopaedic Surgery 06/17/2016, 6:49 AM

## 2016-06-17 DIAGNOSIS — J449 Chronic obstructive pulmonary disease, unspecified: Secondary | ICD-10-CM | POA: Diagnosis not present

## 2016-06-17 DIAGNOSIS — I509 Heart failure, unspecified: Secondary | ICD-10-CM | POA: Diagnosis not present

## 2016-06-17 DIAGNOSIS — Z9981 Dependence on supplemental oxygen: Secondary | ICD-10-CM | POA: Diagnosis not present

## 2016-06-17 DIAGNOSIS — M6281 Muscle weakness (generalized): Secondary | ICD-10-CM | POA: Diagnosis not present

## 2016-06-17 DIAGNOSIS — G4730 Sleep apnea, unspecified: Secondary | ICD-10-CM | POA: Diagnosis not present

## 2016-06-17 DIAGNOSIS — K5900 Constipation, unspecified: Secondary | ICD-10-CM | POA: Diagnosis not present

## 2016-06-17 DIAGNOSIS — N189 Chronic kidney disease, unspecified: Secondary | ICD-10-CM | POA: Diagnosis not present

## 2016-06-17 DIAGNOSIS — Z7984 Long term (current) use of oral hypoglycemic drugs: Secondary | ICD-10-CM | POA: Diagnosis not present

## 2016-06-17 DIAGNOSIS — Z96641 Presence of right artificial hip joint: Secondary | ICD-10-CM | POA: Diagnosis not present

## 2016-06-17 DIAGNOSIS — R2689 Other abnormalities of gait and mobility: Secondary | ICD-10-CM | POA: Diagnosis not present

## 2016-06-17 DIAGNOSIS — Z471 Aftercare following joint replacement surgery: Secondary | ICD-10-CM | POA: Diagnosis not present

## 2016-06-17 DIAGNOSIS — E119 Type 2 diabetes mellitus without complications: Secondary | ICD-10-CM | POA: Diagnosis not present

## 2016-06-17 DIAGNOSIS — I878 Other specified disorders of veins: Secondary | ICD-10-CM | POA: Diagnosis not present

## 2016-06-17 DIAGNOSIS — M25559 Pain in unspecified hip: Secondary | ICD-10-CM | POA: Diagnosis not present

## 2016-06-17 DIAGNOSIS — Z7401 Bed confinement status: Secondary | ICD-10-CM | POA: Diagnosis not present

## 2016-06-17 DIAGNOSIS — M810 Age-related osteoporosis without current pathological fracture: Secondary | ICD-10-CM | POA: Diagnosis not present

## 2016-06-17 DIAGNOSIS — M159 Polyosteoarthritis, unspecified: Secondary | ICD-10-CM | POA: Diagnosis not present

## 2016-06-17 DIAGNOSIS — R7611 Nonspecific reaction to tuberculin skin test without active tuberculosis: Secondary | ICD-10-CM | POA: Diagnosis not present

## 2016-06-17 DIAGNOSIS — Z7951 Long term (current) use of inhaled steroids: Secondary | ICD-10-CM | POA: Diagnosis not present

## 2016-06-17 LAB — GLUCOSE, CAPILLARY
GLUCOSE-CAPILLARY: 123 mg/dL — AB (ref 65–99)
Glucose-Capillary: 108 mg/dL — ABNORMAL HIGH (ref 65–99)
Glucose-Capillary: 123 mg/dL — ABNORMAL HIGH (ref 65–99)

## 2016-06-17 NOTE — Progress Notes (Signed)
Patient is being discharged to [facility]. Report called to [facility]. IV removed, belongings packed. Patient will call family to pick up some of her belongings since EMS is unable to take all of her things. EMS called to pick up patient.

## 2016-06-17 NOTE — Progress Notes (Signed)
Physical Therapy Treatment Patient Details Name: Amber Holder MRN: [id number] DOB: [birth year] Today's Date: 06/17/2016    History of Present Illness Pt. is a 70 y.o. female who was admitted to [facility] for a Right THR    PT Comments    Pt in bed ready for session.  Able to get to edge of bed with rail and slow guarded movements and increased time but without physical assist.  She ambulated to bathroom to urinate then continued to ambulate 140' in hallway with walker and min guard.  She did have one small lob which she recovered with use of handrail while ambulating sideways to commode.  Participated in exercises as described below.  She then requested [facility] for BM at end of session and was left with phone in reach and instructed to call nsg when finished.  Anticipate D/C to rehab today.     Follow Up Recommendations  SNF     Equipment Recommendations  Rolling walker with 5" wheels    Recommendations for Other Services       Precautions / Restrictions Precautions Precautions: Fall;Posterior Hip Restrictions Weight Bearing Restrictions: Yes RLE Weight Bearing: Weight bearing as tolerated    Mobility  Bed Mobility Overal bed mobility: Needs Assistance Bed Mobility: Supine to Sit     Supine to sit: Min assist        Transfers Overall transfer level: Needs assistance Equipment used: Rolling walker (2 wheeled) Transfers: Sit to/from Stand Sit to Stand: Min guard            Ambulation/Gait Ambulation/Gait assistance: Min guard Ambulation Distance (Feet): 140 Feet Assistive device: Rolling walker (2 wheeled) Gait Pattern/deviations: Step-to pattern   Gait velocity interpretation: <1.8 ft/sec, indicative of risk for recurrent falls General Gait Details: asssit for O2 tank, slow but steady gait   Stairs            Wheelchair Mobility    Modified Rankin (Stroke Patients Only)       Balance Overall balance assessment: Needs assistance Sitting-balance  support: Feet supported Sitting balance-Leahy Scale: Good     Standing balance support: Bilateral upper extremity supported Standing balance-Leahy Scale: Fair                      Cognition Arousal/Alertness: Awake/alert Behavior During Therapy: WFL for tasks assessed/performed Overall Cognitive Status: Within Functional Limits for tasks assessed                      Exercises Other Exercises Other Exercises: standing exercises for SLrR, hip/knee flexion, ab/adduction for RLE x 10 Other Exercises: seated AROM LAQ RLE x 10 Other Exercises: to toilet and commoce at bedside during session    General Comments        Pertinent Vitals/Pain Pain Assessment: 0-10 Pain Location: at rest    Home Living                      Prior Function            PT Goals (current goals can now be found in the care plan section) Progress towards PT goals: Progressing toward goals    Frequency    BID      PT Plan Current plan remains appropriate    Co-evaluation             End of Session Equipment Utilized During Treatment: Gait belt;Oxygen Activity Tolerance: Patient tolerated treatment well Patient left: Other (comment);with call  bell/phone within reach (on commode, instructed to call for nsg when finished)     Time: 0825-0900 PT Time Calculation (min) (ACUTE ONLY): 35 min  Charges:  $Gait Training: 8-22 mins $Therapeutic Exercise: 8-22 mins                    G Codes:      Chesley Noon 26-Jun-2016, 10:18 AM

## 2016-06-17 NOTE — Progress Notes (Signed)
OT Cancellation Note  Patient Details Name: Amber Holder MRN: [id number] DOB: 02-22-1946   Cancelled Treatment:    Reason Eval/Treat Not Completed: Patient at procedure or test/ unavailable (Pt with nsg, unavailable for OT treatment at this time. Will attempt at later date/time when available.)  Corky Sox, OTR/L 06/17/2016, 12:32 PM

## 2016-06-17 NOTE — Clinical Social Work Placement (Signed)
   CLINICAL SOCIAL WORK PLACEMENT  NOTE  Date:  06/17/2016  Patient Details  Name: Amber Holder MRN: [id number] Date of Birth: Jan 12, 1946  Clinical Social Work is seeking post-discharge placement for this patient at the [place] level of care (*CSW will initial, date and re-position this form in  chart as items are completed):  Yes   Patient/family provided with [place] Work Department's list of facilities offering this level of care within the geographic area requested by the patient (or if unable, by the patient's family).  Yes   Patient/family informed of their freedom to choose among providers that offer the needed level of care, that participate in Medicare, Medicaid or managed care program needed by the patient, have an available bed and are willing to accept the patient.  Yes   Patient/family informed of [place]'s ownership interest in [facility] and [facility], as well as of the fact that they are under no obligation to receive care at these facilities.  PASRR submitted to EDS on 06/15/16     PASRR number received on 06/15/16     Existing PASRR number confirmed on       FL2 transmitted to all facilities in geographic area requested by pt/family on 06/15/16     FL2 transmitted to all facilities within larger geographic area on       Patient informed that his/her managed care company has contracts with or will negotiate with certain facilities, including the following:        Yes   Patient/family informed of bed offers received.  Patient chooses bed at  [facility])     Physician recommends and patient chooses bed at      Patient to be transferred to  [facility]) on 06/17/16.  Patient to be transferred to facility by  (EMS)     Patient family notified on 06/17/16 of transfer.  Name of family member notified:   Remo Lipps - Son)     PHYSICIAN Please sign FL2     Additional Comment:     _______________________________________________ Baldemar Lenis, LCSW 06/17/2016, 10:14 AM

## 2016-06-17 NOTE — Progress Notes (Signed)
  Subjective: 3 Days Post-Op Procedure(s) (LRB): TOTAL HIP ARTHROPLASTY (Right) Patient reports pain as moderate.  She has more pain with moving. Patient seen in rounds with Dr. Marry Guan. Patient is well, and has had no acute complaints or problems. Plan is to go to rehabilitation after hospital stay. Negative for chest pain and shortness of breath Fever: no Gastrointestinal: Negative for nausea and vomiting  Objective: Vital signs in last 24 hours: Temp:  [97.7 F (36.5 C)-99.2 F (37.3 C)] 98.8 F (37.1 C) (10/21 0336) Pulse Rate:  [78-90] 90 (10/21 0336) Resp:  [16-18] 16 (10/21 0336) BP: (91-117)/(41-76) 114/76 (10/21 0336) SpO2:  [92 %-98 %] 92 % (10/21 0336)  Intake/Output from previous day:  Intake/Output Summary (Last 24 hours) at 06/17/16 0647 Last data filed at 06/17/16 0500  Gross per 24 hour  Intake              120 ml  Output             1425 ml  Net            -1305 ml    Intake/Output this shift: Total I/O In: -  Out: 1100 [Urine:1100]  Labs:  Recent Labs  06/15/16 0545 06/16/16 0403  HGB 10.5* 10.5*    Recent Labs  06/15/16 0545 06/16/16 0403  WBC 11.7* 10.6  RBC 3.56* 3.44*  HCT 31.3* 30.9*  PLT 158 151    Recent Labs  06/15/16 0545 06/16/16 0403  NA 138 139  K 3.9 3.9  CL 112* 109  CO2 22 25  BUN 16 22*  CREATININE 0.51 0.57  GLUCOSE 130* 99  CALCIUM 8.2* 8.1*   No results for input(s): LABPT, INR in the last 72 hours.   EXAM General - Patient is Alert and Oriented Extremity - Sensation intact distally Dorsiflexion/Plantar flexion intact No cellulitis present Compartment soft Dressing/Incision - clean, dry, no drainage.  Motor Function - intact, moving foot and toes well on exam. The patient ambulated 100 feet with physical therapy with some Improvement with transfers.  Past Medical History:  Diagnosis Date  . Arthritis   . Asthma   . Chronic kidney disease    Kidney stones  . COPD (chronic obstructive pulmonary  disease) ([place])   . Diabetes mellitus without complication ([place])   . GERD (gastroesophageal reflux disease)   . HOH (hard of hearing)    Bilateral hearing aids  . Osteoporosis   . Sleep apnea    Does not use C-PAP on a regular basis  . Venous stasis     Assessment/Plan: 3 Days Post-Op Procedure(s) (LRB): TOTAL HIP ARTHROPLASTY (Right) Active Problems:   S/P total hip arthroplasty  Estimated body mass index is 29.12 kg/m as calculated from the following:   Height as of this encounter: 5\' 5"  (1.651 m).   Weight as of this encounter: 79.4 kg (175 lb).  The plan is for the patient to discharge today to [facility]. The patient will follow-up in 6 weeks with Dr. Marry Guan   DVT Prophylaxis - Lovenox, Foot Pumps and TED hose Weight-Bearing as tolerated to right leg  Reche Dixon, PA-C Orthopaedic Surgery 06/17/2016, 6:47 AM

## 2016-06-17 NOTE — Progress Notes (Signed)
Clinical Social Worker was informed that patient will be medically ready to discharge to [place]. Patient in a agreement with plan. CSW called Sharyn Lull- Admissions Coordinator at [facility] to confirm that patient's bed is ready. Provided patient's room number 207 and number to call for report [phone] . All discharge information faxed to [facility] via [place].  RN will call report and patient will discharge to [facility] via EMS.  Ernest Pine, MSW, LCSW, [place] Clinical Social Worker [phone]

## 2016-06-19 DIAGNOSIS — K5900 Constipation, unspecified: Secondary | ICD-10-CM | POA: Diagnosis not present

## 2016-06-19 DIAGNOSIS — J449 Chronic obstructive pulmonary disease, unspecified: Secondary | ICD-10-CM | POA: Diagnosis not present

## 2016-06-19 DIAGNOSIS — M159 Polyosteoarthritis, unspecified: Secondary | ICD-10-CM | POA: Diagnosis not present

## 2016-06-19 DIAGNOSIS — I509 Heart failure, unspecified: Secondary | ICD-10-CM | POA: Diagnosis not present

## 2016-06-26 DIAGNOSIS — J45909 Unspecified asthma, uncomplicated: Secondary | ICD-10-CM | POA: Diagnosis not present

## 2016-06-26 DIAGNOSIS — J449 Chronic obstructive pulmonary disease, unspecified: Secondary | ICD-10-CM | POA: Diagnosis not present

## 2016-06-26 DIAGNOSIS — E119 Type 2 diabetes mellitus without complications: Secondary | ICD-10-CM | POA: Diagnosis not present

## 2016-06-26 DIAGNOSIS — I872 Venous insufficiency (chronic) (peripheral): Secondary | ICD-10-CM | POA: Diagnosis not present

## 2016-06-26 DIAGNOSIS — Z471 Aftercare following joint replacement surgery: Secondary | ICD-10-CM | POA: Diagnosis not present

## 2016-06-26 DIAGNOSIS — M1990 Unspecified osteoarthritis, unspecified site: Secondary | ICD-10-CM | POA: Diagnosis not present

## 2016-06-27 DIAGNOSIS — J449 Chronic obstructive pulmonary disease, unspecified: Secondary | ICD-10-CM | POA: Diagnosis not present

## 2016-06-27 DIAGNOSIS — M1990 Unspecified osteoarthritis, unspecified site: Secondary | ICD-10-CM | POA: Diagnosis not present

## 2016-06-27 DIAGNOSIS — E119 Type 2 diabetes mellitus without complications: Secondary | ICD-10-CM | POA: Diagnosis not present

## 2016-06-27 DIAGNOSIS — Z471 Aftercare following joint replacement surgery: Secondary | ICD-10-CM | POA: Diagnosis not present

## 2016-06-27 DIAGNOSIS — J45909 Unspecified asthma, uncomplicated: Secondary | ICD-10-CM | POA: Diagnosis not present

## 2016-06-27 DIAGNOSIS — I872 Venous insufficiency (chronic) (peripheral): Secondary | ICD-10-CM | POA: Diagnosis not present

## 2016-06-28 ENCOUNTER — Encounter: Admission: RE | Admit: 2016-06-28 | Payer: Medicare Other | Source: Ambulatory Visit | Admitting: Internal Medicine

## 2016-06-28 DIAGNOSIS — Z471 Aftercare following joint replacement surgery: Secondary | ICD-10-CM | POA: Diagnosis not present

## 2016-06-28 DIAGNOSIS — I872 Venous insufficiency (chronic) (peripheral): Secondary | ICD-10-CM | POA: Diagnosis not present

## 2016-06-28 DIAGNOSIS — M1990 Unspecified osteoarthritis, unspecified site: Secondary | ICD-10-CM | POA: Diagnosis not present

## 2016-06-28 DIAGNOSIS — J45909 Unspecified asthma, uncomplicated: Secondary | ICD-10-CM | POA: Diagnosis not present

## 2016-06-28 DIAGNOSIS — J449 Chronic obstructive pulmonary disease, unspecified: Secondary | ICD-10-CM | POA: Diagnosis not present

## 2016-06-28 DIAGNOSIS — E119 Type 2 diabetes mellitus without complications: Secondary | ICD-10-CM | POA: Diagnosis not present

## 2016-06-30 DIAGNOSIS — E119 Type 2 diabetes mellitus without complications: Secondary | ICD-10-CM | POA: Diagnosis not present

## 2016-06-30 DIAGNOSIS — M1990 Unspecified osteoarthritis, unspecified site: Secondary | ICD-10-CM | POA: Diagnosis not present

## 2016-06-30 DIAGNOSIS — I872 Venous insufficiency (chronic) (peripheral): Secondary | ICD-10-CM | POA: Diagnosis not present

## 2016-06-30 DIAGNOSIS — J45909 Unspecified asthma, uncomplicated: Secondary | ICD-10-CM | POA: Diagnosis not present

## 2016-06-30 DIAGNOSIS — J449 Chronic obstructive pulmonary disease, unspecified: Secondary | ICD-10-CM | POA: Diagnosis not present

## 2016-06-30 DIAGNOSIS — Z471 Aftercare following joint replacement surgery: Secondary | ICD-10-CM | POA: Diagnosis not present

## 2016-07-03 DIAGNOSIS — Z471 Aftercare following joint replacement surgery: Secondary | ICD-10-CM | POA: Diagnosis not present

## 2016-07-03 DIAGNOSIS — E119 Type 2 diabetes mellitus without complications: Secondary | ICD-10-CM | POA: Diagnosis not present

## 2016-07-03 DIAGNOSIS — I872 Venous insufficiency (chronic) (peripheral): Secondary | ICD-10-CM | POA: Diagnosis not present

## 2016-07-03 DIAGNOSIS — J449 Chronic obstructive pulmonary disease, unspecified: Secondary | ICD-10-CM | POA: Diagnosis not present

## 2016-07-03 DIAGNOSIS — M1990 Unspecified osteoarthritis, unspecified site: Secondary | ICD-10-CM | POA: Diagnosis not present

## 2016-07-03 DIAGNOSIS — J45909 Unspecified asthma, uncomplicated: Secondary | ICD-10-CM | POA: Diagnosis not present

## 2016-07-04 DIAGNOSIS — J45909 Unspecified asthma, uncomplicated: Secondary | ICD-10-CM | POA: Diagnosis not present

## 2016-07-04 DIAGNOSIS — I872 Venous insufficiency (chronic) (peripheral): Secondary | ICD-10-CM | POA: Diagnosis not present

## 2016-07-04 DIAGNOSIS — Z471 Aftercare following joint replacement surgery: Secondary | ICD-10-CM | POA: Diagnosis not present

## 2016-07-04 DIAGNOSIS — J449 Chronic obstructive pulmonary disease, unspecified: Secondary | ICD-10-CM | POA: Diagnosis not present

## 2016-07-04 DIAGNOSIS — E119 Type 2 diabetes mellitus without complications: Secondary | ICD-10-CM | POA: Diagnosis not present

## 2016-07-04 DIAGNOSIS — M1990 Unspecified osteoarthritis, unspecified site: Secondary | ICD-10-CM | POA: Diagnosis not present

## 2016-07-05 DIAGNOSIS — M1990 Unspecified osteoarthritis, unspecified site: Secondary | ICD-10-CM | POA: Diagnosis not present

## 2016-07-05 DIAGNOSIS — J45909 Unspecified asthma, uncomplicated: Secondary | ICD-10-CM | POA: Diagnosis not present

## 2016-07-05 DIAGNOSIS — Z471 Aftercare following joint replacement surgery: Secondary | ICD-10-CM | POA: Diagnosis not present

## 2016-07-05 DIAGNOSIS — I872 Venous insufficiency (chronic) (peripheral): Secondary | ICD-10-CM | POA: Diagnosis not present

## 2016-07-05 DIAGNOSIS — J449 Chronic obstructive pulmonary disease, unspecified: Secondary | ICD-10-CM | POA: Diagnosis not present

## 2016-07-05 DIAGNOSIS — E119 Type 2 diabetes mellitus without complications: Secondary | ICD-10-CM | POA: Diagnosis not present

## 2016-07-07 ENCOUNTER — Emergency Department
Admission: EM | Admit: 2016-07-07 | Discharge: 2016-07-07 | Disposition: A | Discharge status: Home / Self Care | Payer: Medicare Other | Attending: Emergency Medicine | Admitting: Emergency Medicine

## 2016-07-07 ENCOUNTER — Emergency Department: Payer: Medicare Other

## 2016-07-07 ENCOUNTER — Encounter: Payer: Self-pay | Admitting: Emergency Medicine

## 2016-07-07 DIAGNOSIS — Z79899 Other long term (current) drug therapy: Secondary | ICD-10-CM | POA: Diagnosis not present

## 2016-07-07 DIAGNOSIS — J45909 Unspecified asthma, uncomplicated: Secondary | ICD-10-CM | POA: Diagnosis not present

## 2016-07-07 DIAGNOSIS — R110 Nausea: Secondary | ICD-10-CM | POA: Diagnosis not present

## 2016-07-07 DIAGNOSIS — I872 Venous insufficiency (chronic) (peripheral): Secondary | ICD-10-CM | POA: Diagnosis not present

## 2016-07-07 DIAGNOSIS — Z7984 Long term (current) use of oral hypoglycemic drugs: Secondary | ICD-10-CM | POA: Diagnosis not present

## 2016-07-07 DIAGNOSIS — N189 Chronic kidney disease, unspecified: Secondary | ICD-10-CM | POA: Insufficient documentation

## 2016-07-07 DIAGNOSIS — M1990 Unspecified osteoarthritis, unspecified site: Secondary | ICD-10-CM | POA: Diagnosis not present

## 2016-07-07 DIAGNOSIS — R55 Syncope and collapse: Secondary | ICD-10-CM | POA: Insufficient documentation

## 2016-07-07 DIAGNOSIS — E119 Type 2 diabetes mellitus without complications: Secondary | ICD-10-CM | POA: Diagnosis not present

## 2016-07-07 DIAGNOSIS — F17210 Nicotine dependence, cigarettes, uncomplicated: Secondary | ICD-10-CM | POA: Diagnosis not present

## 2016-07-07 DIAGNOSIS — E1122 Type 2 diabetes mellitus with diabetic chronic kidney disease: Secondary | ICD-10-CM | POA: Diagnosis not present

## 2016-07-07 DIAGNOSIS — Z471 Aftercare following joint replacement surgery: Secondary | ICD-10-CM | POA: Diagnosis not present

## 2016-07-07 DIAGNOSIS — R079 Chest pain, unspecified: Secondary | ICD-10-CM | POA: Diagnosis not present

## 2016-07-07 DIAGNOSIS — R0789 Other chest pain: Secondary | ICD-10-CM | POA: Diagnosis not present

## 2016-07-07 DIAGNOSIS — J449 Chronic obstructive pulmonary disease, unspecified: Secondary | ICD-10-CM | POA: Diagnosis not present

## 2016-07-07 DIAGNOSIS — R6889 Other general symptoms and signs: Secondary | ICD-10-CM | POA: Diagnosis not present

## 2016-07-07 LAB — CBC
HEMATOCRIT: 39.3 % (ref 35.0–47.0)
Hemoglobin: 13.1 g/dL (ref 12.0–16.0)
MCH: 29.6 pg (ref 26.0–34.0)
MCHC: 33.3 g/dL (ref 32.0–36.0)
MCV: 89 fL (ref 80.0–100.0)
PLATELETS: 285 10*3/uL (ref 150–440)
RBC: 4.42 MIL/uL (ref 3.80–5.20)
RDW: 14 % (ref 11.5–14.5)
WBC: 7.8 10*3/uL (ref 3.6–11.0)

## 2016-07-07 LAB — BASIC METABOLIC PANEL
Anion gap: 7 (ref 5–15)
BUN: 14 mg/dL (ref 6–20)
CALCIUM: 9.6 mg/dL (ref 8.9–10.3)
CO2: 27 mmol/L (ref 22–32)
CREATININE: 0.72 mg/dL (ref 0.44–1.00)
Chloride: 102 mmol/L (ref 101–111)
GFR calc Af Amer: >60 mL/min (ref >60)
GLUCOSE: 115 mg/dL — AB (ref 65–99)
POTASSIUM: 4 mmol/L (ref 3.5–5.1)
SODIUM: 136 mmol/L (ref 135–145)

## 2016-07-07 LAB — TROPONIN I
Troponin I: <0.03 ng/mL (ref <0.03)
Troponin I: <0.03 ng/mL (ref <0.03)

## 2016-07-07 MED ORDER — SODIUM CHLORIDE 0.9 % IV BOLUS (SEPSIS)
500.0000 mL | Freq: Once | INTRAVENOUS | Status: AC
  Administered 2016-07-07: 500 mL via INTRAVENOUS

## 2016-07-07 MED ORDER — DILTIAZEM HCL 25 MG/5ML IV SOLN: 15.0000 mg | Freq: Once | INTRAVENOUS | Status: DC

## 2016-07-07 MED ORDER — IOPAMIDOL (ISOVUE-370) INJECTION 76%
75.0000 mL | Freq: Once | INTRAVENOUS | Status: AC | PRN
  Administered 2016-07-07: 75 mL via INTRAVENOUS

## 2016-07-07 NOTE — ED Notes (Signed)
Patient transported to CT 

## 2016-07-07 NOTE — ED Provider Notes (Signed)
[facility] Emergency Department Provider Note  ____________________________________________   First MD Initiated Contact with Patient 07/07/16 1945     (approximate)  I have reviewed the triage vital signs and the nursing notes.   HISTORY  Chief Complaint Loss of Consciousness   HPI Kenyette Sanft is a 70 y.o. female who had a right hip replacement 2 weeks ago which presented to emergency department after syncopal episode. She says that she was cooking a meal in her home and she began to feel nauseous and then have a cramping feeling in her lower abdomen like she had to move her bowels. She says that she moved over to a chair where she proceeded to lose consciousness for several seconds. She had no preceding chest pain or shortness of breath. However, she says that she had a left-sided chest pain that lasts about 5 minutes afterwards. She denies any shortness of breath at that time. Denies any chest pain or shortness of breath at this time. She is accompanied by her family at the bedside at this time who say that she then had a second episode where she lost consciousness again for several seconds. There was no seizure activity reported. Patient does not report losing bowel or bladder continence.  Said that she previously had a similar episode but this was many years ago.     Past Medical History:  Diagnosis Date  . Arthritis   . Asthma   . Chronic kidney disease    Kidney stones  . COPD (chronic obstructive pulmonary disease) ([place])   . Diabetes mellitus without complication ([place])   . GERD (gastroesophageal reflux disease)   . HOH (hard of hearing)    Bilateral hearing aids  . Osteoporosis   . Sleep apnea    Does not use C-PAP on a regular basis  . Venous stasis     Patient Active Problem List   Diagnosis Date Noted  . S/P total hip arthroplasty 06/14/2016    Past Surgical History:  Procedure Laterality Date  . APPENDECTOMY    . cataract  surgery Bilateral   . CHOLECYSTECTOMY    . COLONOSCOPY    . COLONOSCOPY WITH PROPOFOL N/A 05/28/2015   Procedure: COLONOSCOPY WITH PROPOFOL;  Surgeon: Lollie Sails, MD;  Location: [facility] ENDOSCOPY;  Service: Endoscopy;  Laterality: N/A;  . EYE SURGERY Bilateral 2015   Cataract Extraction with IOL  . LITHOTRIPSY  2015   has had many stones  . Mastoidotomy  1970  . ROTATOR CUFF REPAIR Right 2011  . TOTAL HIP ARTHROPLASTY Right 06/14/2016   Procedure: TOTAL HIP ARTHROPLASTY;  Surgeon: Dereck Leep, MD;  Location: [facility] ORS;  Service: Orthopedics;  Laterality: Right;    Prior to Admission medications   Medication Sig Start Date End Date Taking? Authorizing Provider  acetaminophen (TYLENOL) 500 MG chewable tablet Chew 1,000 mg by mouth every 8 (eight) hours as needed for pain.    Historical Provider, MD  albuterol (PROVENTIL HFA;VENTOLIN HFA) 108 (90 BASE) MCG/ACT inhaler Inhale 2 puffs into the lungs every 6 (six) hours as needed for wheezing or shortness of breath.    Historical Provider, MD  Carboxymethylcell-Hypromellose (GENTEAL) 0.25-0.3 % GEL Apply 1 drop to eye as needed.    Historical Provider, MD  cholecalciferol (VITAMIN D) 400 UNITS TABS tablet Take 2,000 Units by mouth daily.     Historical Provider, MD  enoxaparin (LOVENOX) 40 MG/0.4ML injection Inject 0.4 mLs (40 mg total) into the skin daily. 06/16/16  Reche Dixon, PA-C  furosemide (LASIX) 20 MG tablet Take 20 mg by mouth as needed.     Historical Provider, MD  ibandronate (BONIVA) 150 MG tablet Take 150 mg by mouth every 30 (thirty) days. Take in the morning with a full glass of water, on an empty stomach, and do not take anything else by mouth or lie down for the next 30 min.    Historical Provider, MD  meloxicam (MOBIC) 7.5 MG tablet Take 1 tablet (7.5 mg total) by mouth as needed for pain. 06/16/16   Reche Dixon, PA-C  metFORMIN (GLUCOPHAGE) 500 MG tablet Take 250 mg by mouth daily with breakfast.     Historical Provider, MD   montelukast (SINGULAIR) 10 MG tablet Take 10 mg by mouth as needed.     Historical Provider, MD  omega-3 acid ethyl esters (LOVAZA) 1 G capsule Take by mouth daily.    Historical Provider, MD  potassium chloride (KLOR-CON) 8 MEQ tablet Take 8 mEq by mouth as needed (only take when taking Lasix).     Historical Provider, MD  tapentadol (NUCYNTA) 50 MG tablet Take 1-2 tablets (50-100 mg total) by mouth every 4 (four) hours as needed for moderate pain. 06/16/16   Reche Dixon, PA-C  tiotropium (SPIRIVA) 18 MCG inhalation capsule Place 18 mcg into inhaler and inhale daily.    Historical Provider, MD  traMADol (ULTRAM) 50 MG tablet Take 1-2 tablets (50-100 mg total) by mouth every 4 (four) hours as needed for moderate pain. 06/16/16   Reche Dixon, PA-C    Allergies Shellfish allergy; Sulfa antibiotics; and Percocet [oxycodone-acetaminophen]  Family History  Problem Relation Age of Onset  . Breast cancer Maternal Aunt 50    Social History Social History  Substance Use Topics  . Smoking status: Current Every Day Smoker    Packs/day: 1.00    Types: Cigarettes  . Smokeless tobacco: Never Used  . Alcohol use Yes     Comment: occassional    Review of Systems Constitutional: No fever/chills Eyes: No visual changes. ENT: No sore throat. Cardiovascular: As above Respiratory: Denies shortness of breath. Gastrointestinal: No abdominal pain.  No nausea, no vomiting.  No diarrhea.  No constipation. Genitourinary: Negative for dysuria. Musculoskeletal: Negative for back pain. Skin: Negative for rash. Neurological: Negative for headaches, focal weakness or numbness.  10-point ROS otherwise negative.  ____________________________________________   PHYSICAL EXAM:  VITAL SIGNS: ED Triage Vitals  Enc Vitals Group     BP 07/07/16 1947 135/86     Pulse Rate 07/07/16 1947 76     Resp 07/07/16 1947 14     Temp 07/07/16 1947 97.8 F (36.6 C)     Temp Source 07/07/16 1947 Oral     SpO2  07/07/16 1947 96 %     Weight 07/07/16 1948 170 lb (77.1 kg)     Height 07/07/16 1948 5\' 5"  (1.651 m)     Head Circumference --      Peak Flow --      Pain Score 07/07/16 1948 0     Pain Loc --      Pain Edu? --      Excl. in [place]? --     Constitutional: Alert and oriented. Well appearing and in no acute distress. Eyes: Conjunctivae are normal. PERRL. EOMI. Head: Atraumatic. Nose: No congestion/rhinnorhea. Mouth/Throat: Mucous membranes are moist.   Neck: No stridor.   Cardiovascular: Normal rate, regular rhythm. Grossly normal heart sounds.   Respiratory: Normal respiratory effort.  No  retractions. Lungs CTAB. Gastrointestinal: Soft and nontender. No distention. No abdominal bruits. No CVA tenderness. Musculoskeletal: Mild bilateral lower extremity edema which the patient says is chronic and unchanged. Right hip with healing incision without any dehiscence, surrounding induration or pus.  No joint effusions. Neurologic:  Normal speech and language. No gross focal neurologic deficits are appreciated.  Skin:  Skin is warm, dry and intact. No rash noted. Psychiatric: Mood and affect are normal. Speech and behavior are normal.  ____________________________________________   LABS (all labs ordered are listed, but only abnormal results are displayed)  Labs Reviewed  BASIC METABOLIC PANEL - Abnormal; Notable for the following:       Result Value   Glucose, Bld 115 (*)    All other components within normal limits  CBC  TROPONIN I  TROPONIN I   ____________________________________________  EKG  ED ECG REPORT I, Doran Stabler, the attending physician, personally viewed and interpreted this ECG.   Date: 07/07/2016  EKG Time: 1948  Rate: 73  Rhythm: normal sinus rhythm  Axis: Normal  Intervals:none  ST&T Change: No ST segment elevation or depression. No abnormal T-wave inversion.  ____________________________________________  RADIOLOGY    CT Angio Chest PE W  and/or Wo Contrast (Final result)  Result time 07/07/16 20:58:19  Final result by Nelson Chimes, MD (07/07/16 20:58:19)           Narrative:   CLINICAL DATA: Episode of nausea and near syncope. Hip replacement 2 weeks ago.  EXAM: CT ANGIOGRAPHY CHEST WITH CONTRAST  TECHNIQUE: Multidetector CT imaging of the chest was performed using the standard protocol during bolus administration of intravenous contrast. Multiplanar CT image reconstructions and MIPs were obtained to evaluate the vascular anatomy.  CONTRAST: 75 cc Isovue 370  COMPARISON: 07/28/2007  FINDINGS: Cardiovascular: Pulmonary arterial opacification is good. There are no pulmonary emboli. There is chronic focal stenosis sets of the right main pulmonary artery proximally with poststenotic dilatation. Heart size is normal. There is coronary artery calcification. There is aortic atherosclerosis but no aneurysm or dissection. No pericardial fluid.  Mediastinum/Nodes: Few calcified hilar and mediastinal nodes. No evidence of active mediastinal pathology.  Lungs/Pleura: Emphysema. Chronic pleural and parenchymal scarring at the right apex. Mild scarring at both lung bases. No pleural effusion.  Upper Abdomen: Negative. Nonobstructing 3 mm stone upper pole left kidney.  Musculoskeletal: Ordinary thoracic spondylosis.  Review of the MIP images confirms the above findings.  IMPRESSION: No pulmonary emboli or other acute chest pathology.  Background pattern of emphysema. Chronic benign scarring at the apices right more than left.  Chronic stenosis right proximal main pulmonary artery with poststenotic dilatation.  Aortic atherosclerosis. Coronary artery atherosclerosis.   Electronically Signed By: Nelson Chimes M.D. On: 07/07/2016 20:58          ____________________________________________   PROCEDURES  Procedure(s) performed:   Procedures  Critical Care performed:    ____________________________________________   INITIAL IMPRESSION / ASSESSMENT AND PLAN / ED COURSE  Pertinent labs & imaging results that were available during my care of the patient were reviewed by me and considered in my medical decision making (see chart for details).  Concern because the patient underwent a recent hip replacement of the right hip. This combined with chest pain as well as syncope. We will proceed with CT of the chest. So far the patient's labs as well as EKG are very reassuring.  Clinical Course    ----------------------------------------- 11:24 PM on 07/07/2016 -----------------------------------------  Patient with 2 negative troponins as  well as CT angiography without any signs of PE. Likely vasovagal episode. Patient to follow-up with her primary care doctor. We have discussed the caution such as pictured stay hydrated as well as not standing in one place for 2 long and if she feels like she is going to pass out to sit down/lay down immediately. She is understanding of this plan and wanted to comply.  Furthermore, the patient's son is at the bedside now and says that the symptoms seemed to start when she stood up quickly after putting a pizza in the abdomen. Possible this triggered the initial drop in blood pressure. Patient continues without any chest pain or lightheadedness at this time. I feel she is appropriate for discharge home. General sense but is willing to comply. Will be following up with her primary care doctor.  ____________________________________________   FINAL CLINICAL IMPRESSION(S) / ED DIAGNOSES  Chest pain. Syncope.    NEW MEDICATIONS STARTED DURING THIS VISIT:  New Prescriptions   No medications on file     Note:  This document was prepared using Dragon voice recognition software and may include unintentional dictation errors.    Orbie Pyo, MD 07/07/16 [id number]

## 2016-07-07 NOTE — ED Triage Notes (Signed)
Pt was putting a pizza in the oven when she became nauseated and fainted. Pt states she felt fine before incident and "feels fine now." pt denies dizziness, shob, pain. Pt recently had total week right hip replacement 2 weeks ago without any complications. Skin pwd, resps unlabored. fsbs of 137 per ems.

## 2016-07-07 NOTE — ED Notes (Signed)
Meal tray provided. Warm blankets provided, bed adjusted for comfort.

## 2016-07-09 DIAGNOSIS — M1990 Unspecified osteoarthritis, unspecified site: Secondary | ICD-10-CM | POA: Diagnosis not present

## 2016-07-09 DIAGNOSIS — J45909 Unspecified asthma, uncomplicated: Secondary | ICD-10-CM | POA: Diagnosis not present

## 2016-07-09 DIAGNOSIS — Z471 Aftercare following joint replacement surgery: Secondary | ICD-10-CM | POA: Diagnosis not present

## 2016-07-09 DIAGNOSIS — J449 Chronic obstructive pulmonary disease, unspecified: Secondary | ICD-10-CM | POA: Diagnosis not present

## 2016-07-09 DIAGNOSIS — E119 Type 2 diabetes mellitus without complications: Secondary | ICD-10-CM | POA: Diagnosis not present

## 2016-07-09 DIAGNOSIS — I872 Venous insufficiency (chronic) (peripheral): Secondary | ICD-10-CM | POA: Diagnosis not present

## 2016-07-11 DIAGNOSIS — I959 Hypotension, unspecified: Secondary | ICD-10-CM | POA: Diagnosis not present

## 2016-07-11 DIAGNOSIS — R55 Syncope and collapse: Secondary | ICD-10-CM | POA: Diagnosis not present

## 2016-07-11 DIAGNOSIS — H9201 Otalgia, right ear: Secondary | ICD-10-CM | POA: Diagnosis not present

## 2016-07-13 DIAGNOSIS — J449 Chronic obstructive pulmonary disease, unspecified: Secondary | ICD-10-CM | POA: Diagnosis not present

## 2016-07-13 DIAGNOSIS — Z471 Aftercare following joint replacement surgery: Secondary | ICD-10-CM | POA: Diagnosis not present

## 2016-07-13 DIAGNOSIS — M1990 Unspecified osteoarthritis, unspecified site: Secondary | ICD-10-CM | POA: Diagnosis not present

## 2016-07-13 DIAGNOSIS — J45909 Unspecified asthma, uncomplicated: Secondary | ICD-10-CM | POA: Diagnosis not present

## 2016-07-13 DIAGNOSIS — E119 Type 2 diabetes mellitus without complications: Secondary | ICD-10-CM | POA: Diagnosis not present

## 2016-07-13 DIAGNOSIS — I872 Venous insufficiency (chronic) (peripheral): Secondary | ICD-10-CM | POA: Diagnosis not present

## 2016-07-17 DIAGNOSIS — Z471 Aftercare following joint replacement surgery: Secondary | ICD-10-CM | POA: Diagnosis not present

## 2016-07-17 DIAGNOSIS — E119 Type 2 diabetes mellitus without complications: Secondary | ICD-10-CM | POA: Diagnosis not present

## 2016-07-17 DIAGNOSIS — J45909 Unspecified asthma, uncomplicated: Secondary | ICD-10-CM | POA: Diagnosis not present

## 2016-07-17 DIAGNOSIS — J449 Chronic obstructive pulmonary disease, unspecified: Secondary | ICD-10-CM | POA: Diagnosis not present

## 2016-07-17 DIAGNOSIS — M1990 Unspecified osteoarthritis, unspecified site: Secondary | ICD-10-CM | POA: Diagnosis not present

## 2016-07-17 DIAGNOSIS — I872 Venous insufficiency (chronic) (peripheral): Secondary | ICD-10-CM | POA: Diagnosis not present

## 2016-07-18 DIAGNOSIS — M1990 Unspecified osteoarthritis, unspecified site: Secondary | ICD-10-CM | POA: Diagnosis not present

## 2016-07-18 DIAGNOSIS — J45909 Unspecified asthma, uncomplicated: Secondary | ICD-10-CM | POA: Diagnosis not present

## 2016-07-18 DIAGNOSIS — Z471 Aftercare following joint replacement surgery: Secondary | ICD-10-CM | POA: Diagnosis not present

## 2016-07-18 DIAGNOSIS — I872 Venous insufficiency (chronic) (peripheral): Secondary | ICD-10-CM | POA: Diagnosis not present

## 2016-07-18 DIAGNOSIS — J449 Chronic obstructive pulmonary disease, unspecified: Secondary | ICD-10-CM | POA: Diagnosis not present

## 2016-07-18 DIAGNOSIS — E119 Type 2 diabetes mellitus without complications: Secondary | ICD-10-CM | POA: Diagnosis not present

## 2016-07-24 DIAGNOSIS — J449 Chronic obstructive pulmonary disease, unspecified: Secondary | ICD-10-CM | POA: Diagnosis not present

## 2016-07-24 DIAGNOSIS — Z471 Aftercare following joint replacement surgery: Secondary | ICD-10-CM | POA: Diagnosis not present

## 2016-07-24 DIAGNOSIS — E119 Type 2 diabetes mellitus without complications: Secondary | ICD-10-CM | POA: Diagnosis not present

## 2016-07-24 DIAGNOSIS — J45909 Unspecified asthma, uncomplicated: Secondary | ICD-10-CM | POA: Diagnosis not present

## 2016-07-24 DIAGNOSIS — I872 Venous insufficiency (chronic) (peripheral): Secondary | ICD-10-CM | POA: Diagnosis not present

## 2016-07-24 DIAGNOSIS — M1990 Unspecified osteoarthritis, unspecified site: Secondary | ICD-10-CM | POA: Diagnosis not present

## 2016-07-25 DIAGNOSIS — Z96641 Presence of right artificial hip joint: Secondary | ICD-10-CM | POA: Diagnosis not present

## 2016-07-26 DIAGNOSIS — J45909 Unspecified asthma, uncomplicated: Secondary | ICD-10-CM | POA: Diagnosis not present

## 2016-07-26 DIAGNOSIS — J449 Chronic obstructive pulmonary disease, unspecified: Secondary | ICD-10-CM | POA: Diagnosis not present

## 2016-07-26 DIAGNOSIS — I872 Venous insufficiency (chronic) (peripheral): Secondary | ICD-10-CM | POA: Diagnosis not present

## 2016-07-26 DIAGNOSIS — Z471 Aftercare following joint replacement surgery: Secondary | ICD-10-CM | POA: Diagnosis not present

## 2016-07-26 DIAGNOSIS — M1990 Unspecified osteoarthritis, unspecified site: Secondary | ICD-10-CM | POA: Diagnosis not present

## 2016-07-26 DIAGNOSIS — E119 Type 2 diabetes mellitus without complications: Secondary | ICD-10-CM | POA: Diagnosis not present

## 2016-07-31 DIAGNOSIS — H7200 Central perforation of tympanic membrane, unspecified ear: Secondary | ICD-10-CM | POA: Diagnosis not present

## 2016-07-31 DIAGNOSIS — H903 Sensorineural hearing loss, bilateral: Secondary | ICD-10-CM | POA: Diagnosis not present

## 2016-08-02 DIAGNOSIS — R55 Syncope and collapse: Secondary | ICD-10-CM | POA: Diagnosis not present

## 2016-08-04 DIAGNOSIS — H04123 Dry eye syndrome of bilateral lacrimal glands: Secondary | ICD-10-CM | POA: Diagnosis not present

## 2016-08-15 DIAGNOSIS — Z471 Aftercare following joint replacement surgery: Secondary | ICD-10-CM | POA: Diagnosis not present

## 2016-08-16 DIAGNOSIS — R55 Syncope and collapse: Secondary | ICD-10-CM | POA: Diagnosis not present

## 2016-08-29 DIAGNOSIS — I959 Hypotension, unspecified: Secondary | ICD-10-CM | POA: Diagnosis not present

## 2016-08-29 DIAGNOSIS — E119 Type 2 diabetes mellitus without complications: Secondary | ICD-10-CM | POA: Diagnosis not present

## 2016-08-29 DIAGNOSIS — R5383 Other fatigue: Secondary | ICD-10-CM | POA: Diagnosis not present

## 2016-08-29 DIAGNOSIS — R55 Syncope and collapse: Secondary | ICD-10-CM | POA: Diagnosis not present

## 2016-09-25 DIAGNOSIS — R55 Syncope and collapse: Secondary | ICD-10-CM | POA: Diagnosis not present

## 2016-09-25 DIAGNOSIS — I10 Essential (primary) hypertension: Secondary | ICD-10-CM | POA: Diagnosis not present

## 2016-09-25 DIAGNOSIS — E559 Vitamin D deficiency, unspecified: Secondary | ICD-10-CM | POA: Diagnosis not present

## 2016-09-25 DIAGNOSIS — I959 Hypotension, unspecified: Secondary | ICD-10-CM | POA: Diagnosis not present

## 2016-09-29 DIAGNOSIS — H04123 Dry eye syndrome of bilateral lacrimal glands: Secondary | ICD-10-CM | POA: Diagnosis not present

## 2016-10-02 DIAGNOSIS — J441 Chronic obstructive pulmonary disease with (acute) exacerbation: Secondary | ICD-10-CM | POA: Diagnosis not present

## 2016-10-02 DIAGNOSIS — F17210 Nicotine dependence, cigarettes, uncomplicated: Secondary | ICD-10-CM | POA: Diagnosis not present

## 2016-10-02 DIAGNOSIS — F330 Major depressive disorder, recurrent, mild: Secondary | ICD-10-CM | POA: Diagnosis not present

## 2016-10-02 DIAGNOSIS — R55 Syncope and collapse: Secondary | ICD-10-CM | POA: Diagnosis not present

## 2016-10-18 ENCOUNTER — Other Ambulatory Visit: Payer: Self-pay | Admitting: Internal Medicine

## 2016-10-18 ENCOUNTER — Ambulatory Visit
Admission: RE | Admit: 2016-10-18 | Discharge: 2016-10-18 | Disposition: A | Discharge status: Home / Self Care | Payer: Medicare Other | Source: Ambulatory Visit | Attending: Internal Medicine | Admitting: Internal Medicine

## 2016-10-18 DIAGNOSIS — R05 Cough: Secondary | ICD-10-CM

## 2016-10-18 DIAGNOSIS — R079 Chest pain, unspecified: Secondary | ICD-10-CM | POA: Diagnosis not present

## 2016-10-18 DIAGNOSIS — J439 Emphysema, unspecified: Secondary | ICD-10-CM | POA: Insufficient documentation

## 2016-10-18 DIAGNOSIS — I700 Atherosclerosis of aorta: Secondary | ICD-10-CM | POA: Insufficient documentation

## 2016-10-18 DIAGNOSIS — R059 Cough, unspecified: Secondary | ICD-10-CM

## 2016-10-18 DIAGNOSIS — R0602 Shortness of breath: Secondary | ICD-10-CM | POA: Diagnosis not present

## 2016-11-09 DIAGNOSIS — I700 Atherosclerosis of aorta: Secondary | ICD-10-CM | POA: Diagnosis not present

## 2016-11-09 DIAGNOSIS — E119 Type 2 diabetes mellitus without complications: Secondary | ICD-10-CM | POA: Diagnosis not present

## 2016-11-09 DIAGNOSIS — R0602 Shortness of breath: Secondary | ICD-10-CM | POA: Diagnosis not present

## 2016-11-09 DIAGNOSIS — F330 Major depressive disorder, recurrent, mild: Secondary | ICD-10-CM | POA: Diagnosis not present

## 2016-11-09 DIAGNOSIS — F17210 Nicotine dependence, cigarettes, uncomplicated: Secondary | ICD-10-CM | POA: Diagnosis not present

## 2016-11-09 DIAGNOSIS — J449 Chronic obstructive pulmonary disease, unspecified: Secondary | ICD-10-CM | POA: Diagnosis not present

## 2016-11-29 DIAGNOSIS — I700 Atherosclerosis of aorta: Secondary | ICD-10-CM | POA: Diagnosis not present

## 2016-12-04 DIAGNOSIS — J449 Chronic obstructive pulmonary disease, unspecified: Secondary | ICD-10-CM | POA: Diagnosis not present

## 2016-12-04 DIAGNOSIS — R197 Diarrhea, unspecified: Secondary | ICD-10-CM | POA: Diagnosis not present

## 2016-12-04 DIAGNOSIS — G4730 Sleep apnea, unspecified: Secondary | ICD-10-CM | POA: Diagnosis not present

## 2016-12-07 DIAGNOSIS — M5416 Radiculopathy, lumbar region: Secondary | ICD-10-CM | POA: Diagnosis not present

## 2016-12-07 DIAGNOSIS — Z96641 Presence of right artificial hip joint: Secondary | ICD-10-CM | POA: Diagnosis not present

## 2017-01-02 DIAGNOSIS — Z872 Personal history of diseases of the skin and subcutaneous tissue: Secondary | ICD-10-CM | POA: Diagnosis not present

## 2017-01-02 DIAGNOSIS — H663X9 Other chronic suppurative otitis media, unspecified ear: Secondary | ICD-10-CM | POA: Diagnosis not present

## 2017-01-02 DIAGNOSIS — L720 Epidermal cyst: Secondary | ICD-10-CM | POA: Diagnosis not present

## 2017-01-02 DIAGNOSIS — H903 Sensorineural hearing loss, bilateral: Secondary | ICD-10-CM | POA: Diagnosis not present

## 2017-01-02 DIAGNOSIS — H722X9 Other marginal perforations of tympanic membrane, unspecified ear: Secondary | ICD-10-CM | POA: Diagnosis not present

## 2017-01-02 DIAGNOSIS — Z86018 Personal history of other benign neoplasm: Secondary | ICD-10-CM | POA: Diagnosis not present

## 2017-02-15 DIAGNOSIS — Z0001 Encounter for general adult medical examination with abnormal findings: Secondary | ICD-10-CM | POA: Diagnosis not present

## 2017-02-15 DIAGNOSIS — I7091 Generalized atherosclerosis: Secondary | ICD-10-CM | POA: Diagnosis not present

## 2017-02-15 DIAGNOSIS — E119 Type 2 diabetes mellitus without complications: Secondary | ICD-10-CM | POA: Diagnosis not present

## 2017-02-15 DIAGNOSIS — E782 Mixed hyperlipidemia: Secondary | ICD-10-CM | POA: Diagnosis not present

## 2017-02-15 DIAGNOSIS — J449 Chronic obstructive pulmonary disease, unspecified: Secondary | ICD-10-CM | POA: Diagnosis not present

## 2017-02-15 DIAGNOSIS — F17210 Nicotine dependence, cigarettes, uncomplicated: Secondary | ICD-10-CM | POA: Diagnosis not present

## 2017-02-15 DIAGNOSIS — N390 Urinary tract infection, site not specified: Secondary | ICD-10-CM | POA: Diagnosis not present

## 2017-02-27 ENCOUNTER — Emergency Department: Payer: Medicare Other

## 2017-02-27 ENCOUNTER — Emergency Department
Admission: EM | Admit: 2017-02-27 | Discharge: 2017-02-27 | Disposition: A | Discharge status: Home / Self Care | Payer: Medicare Other | Attending: Emergency Medicine | Admitting: Emergency Medicine

## 2017-02-27 ENCOUNTER — Encounter: Payer: Self-pay | Admitting: Medical Oncology

## 2017-02-27 DIAGNOSIS — Y998 Other external cause status: Secondary | ICD-10-CM | POA: Insufficient documentation

## 2017-02-27 DIAGNOSIS — M25532 Pain in left wrist: Secondary | ICD-10-CM | POA: Diagnosis not present

## 2017-02-27 DIAGNOSIS — S52611A Displaced fracture of right ulna styloid process, initial encounter for closed fracture: Secondary | ICD-10-CM | POA: Diagnosis not present

## 2017-02-27 DIAGNOSIS — W19XXXA Unspecified fall, initial encounter: Secondary | ICD-10-CM | POA: Diagnosis not present

## 2017-02-27 DIAGNOSIS — S52572A Other intraarticular fracture of lower end of left radius, initial encounter for closed fracture: Secondary | ICD-10-CM | POA: Insufficient documentation

## 2017-02-27 DIAGNOSIS — Y93H2 Activity, gardening and landscaping: Secondary | ICD-10-CM | POA: Diagnosis not present

## 2017-02-27 DIAGNOSIS — S52615A Nondisplaced fracture of left ulna styloid process, initial encounter for closed fracture: Secondary | ICD-10-CM | POA: Insufficient documentation

## 2017-02-27 DIAGNOSIS — S6992XA Unspecified injury of left wrist, hand and finger(s), initial encounter: Secondary | ICD-10-CM | POA: Diagnosis present

## 2017-02-27 DIAGNOSIS — Y929 Unspecified place or not applicable: Secondary | ICD-10-CM | POA: Insufficient documentation

## 2017-02-27 MED ORDER — TRAMADOL HCL 50 MG PO TABS: 50.0000 mg | ORAL_TABLET | Freq: Four times a day (QID) | ORAL | 0 refills | Status: AC | PRN

## 2017-02-27 MED ORDER — NAPROXEN 500 MG PO TABS
500.0000 mg | ORAL_TABLET | Freq: Once | ORAL | Status: AC
Start: 2017-02-27 — End: 2017-02-27
  Administered 2017-02-27: 500 mg via ORAL
  Filled 2017-02-27: qty 1

## 2017-02-27 MED ORDER — HYDROCODONE-ACETAMINOPHEN 5-325 MG PO TABS: 1.0000 | ORAL_TABLET | Freq: Four times a day (QID) | ORAL | 0 refills | Status: DC | PRN

## 2017-02-27 NOTE — ED Triage Notes (Signed)
Pt reports she was raking mulch and tripped and fell onto her left wrist. Swelling noted. Denies head injury.

## 2017-02-27 NOTE — ED Provider Notes (Signed)
[facility] Emergency Department Provider Note  ____________________________________________  Time seen: Approximately 4:03 PM  I have reviewed the triage vital signs and the nursing notes.   HISTORY  Chief Complaint Wrist Pain    HPI Amber Holder is a 71 y.o. female presenting to the emergency department with  left 4 out of 10 wrist pain after patient fell onto left outstretched hand while raking leaves. Patient did not hit her head during the incident. She denies radiculopathy, weakness or changes in sensation of the left upper extremity. Patient denies prior surgeries to the left upper extremity. Patient is under the care of Dr. Marry Guan. She denies associated chest pain, chest tightness, shortness of breath, nausea, vomiting and abdominal pain.   Past Medical History:  Diagnosis Date  . Arthritis   . Asthma   . Chronic kidney disease    Kidney stones  . COPD (chronic obstructive pulmonary disease) ([place])   . Diabetes mellitus without complication ([place])   . GERD (gastroesophageal reflux disease)   . HOH (hard of hearing)    Bilateral hearing aids  . Osteoporosis   . Sleep apnea    Does not use C-PAP on a regular basis  . Venous stasis     Patient Active Problem List   Diagnosis Date Noted  . S/P total hip arthroplasty 06/14/2016    Past Surgical History:  Procedure Laterality Date  . APPENDECTOMY    . cataract surgery Bilateral   . CHOLECYSTECTOMY    . COLONOSCOPY    . COLONOSCOPY WITH PROPOFOL N/A 05/28/2015   Procedure: COLONOSCOPY WITH PROPOFOL;  Surgeon: Lollie Sails, MD;  Location: [facility] ENDOSCOPY;  Service: Endoscopy;  Laterality: N/A;  . EYE SURGERY Bilateral 2015   Cataract Extraction with IOL  . LITHOTRIPSY  2015   has had many stones  . Mastoidotomy  1970  . ROTATOR CUFF REPAIR Right 2011  . TOTAL HIP ARTHROPLASTY Right 06/14/2016   Procedure: TOTAL HIP ARTHROPLASTY;  Surgeon: Dereck Leep, MD;  Location: [facility] ORS;   Service: Orthopedics;  Laterality: Right;    Prior to Admission medications   Medication Sig Start Date End Date Taking? Authorizing Provider  acetaminophen (TYLENOL) 500 MG chewable tablet Chew 1,000 mg by mouth every 8 (eight) hours as needed for pain.    [provider]  albuterol (PROVENTIL HFA;VENTOLIN HFA) 108 (90 BASE) MCG/ACT inhaler Inhale 2 puffs into the lungs every 6 (six) hours as needed for wheezing or shortness of breath.    [provider]  Carboxymethylcell-Hypromellose (GENTEAL) 0.25-0.3 % GEL Apply 1 drop to eye as needed.    [provider]  cholecalciferol (VITAMIN D) 400 UNITS TABS tablet Take 2,000 Units by mouth daily.     [provider]  enoxaparin (LOVENOX) 40 MG/0.4ML injection Inject 0.4 mLs (40 mg total) into the skin daily. 06/16/16   Reche Dixon, PA-C  furosemide (LASIX) 20 MG tablet Take 20 mg by mouth as needed.     [provider]  ibandronate (BONIVA) 150 MG tablet Take 150 mg by mouth every 30 (thirty) days. Take in the morning with a full glass of water, on an empty stomach, and do not take anything else by mouth or lie down for the next 30 min.    [provider]  meloxicam (MOBIC) 7.5 MG tablet Take 1 tablet (7.5 mg total) by mouth as needed for pain. 06/16/16   Reche Dixon, PA-C  metFORMIN (GLUCOPHAGE) 500 MG tablet Take 250 mg by  mouth daily with breakfast.     [provider]  montelukast (SINGULAIR) 10 MG tablet Take 10 mg by mouth as needed.     [provider]  omega-3 acid ethyl esters (LOVAZA) 1 G capsule Take by mouth daily.    [provider]  potassium chloride (KLOR-CON) 8 MEQ tablet Take 8 mEq by mouth as needed (only take when taking Lasix).     [provider]  tapentadol (NUCYNTA) 50 MG tablet Take 1-2 tablets (50-100 mg total) by mouth every 4 (four) hours as needed for moderate pain. 06/16/16   Reche Dixon, PA-C  tiotropium (SPIRIVA) 18 MCG inhalation  capsule Place 18 mcg into inhaler and inhale daily.    [provider]  traMADol (ULTRAM) 50 MG tablet Take 1 tablet (50 mg total) by mouth every 6 (six) hours as needed. 02/27/17 03/04/17  Lannie Fields, PA-C    Allergies Shellfish allergy; Sulfa antibiotics; and Percocet [oxycodone-acetaminophen]  Family History  Problem Relation Age of Onset  . Breast cancer Maternal Aunt 50    Social History Social History  Substance Use Topics  . Smoking status: Current Every Day Smoker    Packs/day: 1.00    Types: Cigarettes  . Smokeless tobacco: Never Used  . Alcohol use Yes     Comment: occassional     Review of Systems  Constitutional: No fever/chills Eyes: No visual changes. No discharge ENT: No upper respiratory complaints. Cardiovascular: no chest pain. Respiratory: no cough. No SOB. Musculoskeletal: Patient has left wrist pain.  Skin: Negative for rash, abrasions, lacerations, ecchymosis. Neurological: Negative for headaches, focal weakness or numbness.   ____________________________________________   PHYSICAL EXAM:  VITAL SIGNS: ED Triage Vitals  Enc Vitals Group     BP 02/27/17 1452 135/65     Pulse Rate 02/27/17 1452 92     Resp 02/27/17 1452 18     Temp 02/27/17 1452 98.1 F (36.7 C)     Temp Source 02/27/17 1452 Oral     SpO2 02/27/17 1452 100 %     Weight 02/27/17 1452 170 lb (77.1 kg)     Height --      Head Circumference --      Peak Flow --      Pain Score 02/27/17 1451 4     Pain Loc --      Pain Edu? --      Excl. in [place]? --      Constitutional: Alert and oriented. Well appearing and in no acute distress. Eyes: Conjunctivae are normal. PERRL. EOMI. Head: Atraumatic. Cardiovascular: Normal rate, regular rhythm. Normal S1 and S2.  Good peripheral circulation. Respiratory: Normal respiratory effort without tachypnea or retractions. Lungs CTAB. Good air entry to the bases with no decreased or absent breath sounds. Musculoskeletal: Patient has  5 out of 5 strength in the upper extremities bilaterally. Patient performs full range of motion at the left elbow. Patient performs limited range of motion at the left wrist, likely secondary to pain and injury. Palpable radial and ulnar pulses bilaterally and symmetrically. Neurologic:  Normal speech and language. No gross focal neurologic deficits are appreciated.  Skin:  Skin is warm, dry and intact. No skin compromise visualized. Psychiatric: Mood and affect are normal. Speech and behavior are normal. Patient exhibits appropriate insight and judgement.   ____________________________________________   LABS (all labs ordered are listed, but only abnormal results are displayed)  Labs Reviewed - No data to display ____________________________________________  EKG   ____________________________________________  RADIOLOGY I, Lannie Fields, personally viewed and evaluated these images (plain radiographs) as part of my medical decision making, as well as reviewing the written report by the radiologist.  Dg Wrist Complete Left  Result Date: 02/27/2017 CLINICAL DATA:  Acute left wrist pain and swelling following fall. Initial encounter. EXAM: LEFT WRIST - COMPLETE 3+ VIEW COMPARISON:  None. FINDINGS: A minimally displaced posterior intraarticular distal radial fracture is noted. An ulnar styloid fracture is present. No subluxation or dislocation noted. Moderate degenerative changes at the first carpometacarpal joint identified. IMPRESSION: Posterior intraarticular distal radial fracture and ulnar styloid fracture. No dislocation. Electronically Signed   By: Margarette Canada M.D.   On: 02/27/2017 15:36    ____________________________________________    PROCEDURES  Procedure(s) performed:    Procedures    Medications  naproxen (NAPROSYN) tablet 500 mg (500 mg Oral Given 02/27/17 1705)     ____________________________________________   INITIAL IMPRESSION / ASSESSMENT AND PLAN / ED  COURSE  Pertinent labs & imaging results that were available during my care of the patient were reviewed by me and considered in my medical decision making (see chart for details).  Review of the [place] CSRS was performed in accordance of the [place] prior to dispensing any controlled drugs.     Assessment and plan: Left wrist pain: Patient presents to the emergency department after patient fell onto left outstretched hand. X-ray examination conducted in the emergency department reveals ulnar styloid and distal radius fractures. Patient was placed in a splint and discharged with tramadol for pain. A referral was made to orthopedics, Dr. Marry Guan. Patient was advised to make follow up appointment this week. All patient questions were answered.   ____________________________________________  FINAL CLINICAL IMPRESSION(S) / ED DIAGNOSES  Final diagnoses:  Left wrist pain      NEW MEDICATIONS STARTED DURING THIS VISIT:  Discharge Medication List as of 02/27/2017  4:30 PM          This chart was dictated using voice recognition software/Dragon. Despite best efforts to proofread, errors can occur which can change the meaning. Any change was purely unintentional.    Lannie Fields, PA-C 02/27/17 1833    Schuyler Amor, MD 02/27/17 [id number]

## 2017-02-27 NOTE — ED Notes (Signed)
Pt states that she took at tramdol around 8am this morning for her hip.

## 2017-02-27 NOTE — ED Notes (Signed)
Pt discharged to home.  Discharge instructions reviewed.  Verbalized understanding.  No questions or concerns at this time.  Teach back verified.  Pt in NAD.  No items left in ED.   

## 2017-03-07 DIAGNOSIS — M25532 Pain in left wrist: Secondary | ICD-10-CM | POA: Diagnosis not present

## 2017-03-07 DIAGNOSIS — S52572A Other intraarticular fracture of lower end of left radius, initial encounter for closed fracture: Secondary | ICD-10-CM | POA: Diagnosis not present

## 2017-03-20 DIAGNOSIS — S52572D Other intraarticular fracture of lower end of left radius, subsequent encounter for closed fracture with routine healing: Secondary | ICD-10-CM | POA: Diagnosis not present

## 2017-03-20 DIAGNOSIS — M25532 Pain in left wrist: Secondary | ICD-10-CM | POA: Diagnosis not present

## 2017-04-03 DIAGNOSIS — S52572D Other intraarticular fracture of lower end of left radius, subsequent encounter for closed fracture with routine healing: Secondary | ICD-10-CM | POA: Diagnosis not present

## 2017-04-03 DIAGNOSIS — M25532 Pain in left wrist: Secondary | ICD-10-CM | POA: Diagnosis not present

## 2017-05-08 DIAGNOSIS — M25532 Pain in left wrist: Secondary | ICD-10-CM | POA: Diagnosis not present

## 2017-05-08 DIAGNOSIS — S52572D Other intraarticular fracture of lower end of left radius, subsequent encounter for closed fracture with routine healing: Secondary | ICD-10-CM | POA: Diagnosis not present

## 2017-05-21 DIAGNOSIS — M25532 Pain in left wrist: Secondary | ICD-10-CM | POA: Diagnosis not present

## 2017-05-21 DIAGNOSIS — M25632 Stiffness of left wrist, not elsewhere classified: Secondary | ICD-10-CM | POA: Diagnosis not present

## 2017-05-21 DIAGNOSIS — S52572D Other intraarticular fracture of lower end of left radius, subsequent encounter for closed fracture with routine healing: Secondary | ICD-10-CM | POA: Diagnosis not present

## 2017-05-21 DIAGNOSIS — R29898 Other symptoms and signs involving the musculoskeletal system: Secondary | ICD-10-CM | POA: Diagnosis not present

## 2017-05-25 DIAGNOSIS — S52572D Other intraarticular fracture of lower end of left radius, subsequent encounter for closed fracture with routine healing: Secondary | ICD-10-CM | POA: Diagnosis not present

## 2017-05-29 DIAGNOSIS — S52572D Other intraarticular fracture of lower end of left radius, subsequent encounter for closed fracture with routine healing: Secondary | ICD-10-CM | POA: Diagnosis not present

## 2017-05-31 DIAGNOSIS — S52572D Other intraarticular fracture of lower end of left radius, subsequent encounter for closed fracture with routine healing: Secondary | ICD-10-CM | POA: Diagnosis not present

## 2017-06-05 DIAGNOSIS — S52572D Other intraarticular fracture of lower end of left radius, subsequent encounter for closed fracture with routine healing: Secondary | ICD-10-CM | POA: Diagnosis not present

## 2017-06-07 DIAGNOSIS — S52572D Other intraarticular fracture of lower end of left radius, subsequent encounter for closed fracture with routine healing: Secondary | ICD-10-CM | POA: Diagnosis not present

## 2017-06-12 DIAGNOSIS — S52572D Other intraarticular fracture of lower end of left radius, subsequent encounter for closed fracture with routine healing: Secondary | ICD-10-CM | POA: Diagnosis not present

## 2017-06-12 DIAGNOSIS — Z96641 Presence of right artificial hip joint: Secondary | ICD-10-CM | POA: Diagnosis not present

## 2017-06-14 DIAGNOSIS — S52572D Other intraarticular fracture of lower end of left radius, subsequent encounter for closed fracture with routine healing: Secondary | ICD-10-CM | POA: Diagnosis not present

## 2017-06-19 DIAGNOSIS — E119 Type 2 diabetes mellitus without complications: Secondary | ICD-10-CM | POA: Diagnosis not present

## 2017-06-19 DIAGNOSIS — N390 Urinary tract infection, site not specified: Secondary | ICD-10-CM | POA: Diagnosis not present

## 2017-06-19 DIAGNOSIS — Z23 Encounter for immunization: Secondary | ICD-10-CM | POA: Diagnosis not present

## 2017-06-19 DIAGNOSIS — J449 Chronic obstructive pulmonary disease, unspecified: Secondary | ICD-10-CM | POA: Diagnosis not present

## 2017-06-19 DIAGNOSIS — S52572D Other intraarticular fracture of lower end of left radius, subsequent encounter for closed fracture with routine healing: Secondary | ICD-10-CM | POA: Diagnosis not present

## 2017-06-19 DIAGNOSIS — F17210 Nicotine dependence, cigarettes, uncomplicated: Secondary | ICD-10-CM | POA: Diagnosis not present

## 2017-06-19 DIAGNOSIS — H60553 Acute reactive otitis externa, bilateral: Secondary | ICD-10-CM | POA: Diagnosis not present

## 2017-06-21 DIAGNOSIS — S52572D Other intraarticular fracture of lower end of left radius, subsequent encounter for closed fracture with routine healing: Secondary | ICD-10-CM | POA: Diagnosis not present

## 2017-06-26 DIAGNOSIS — S52572D Other intraarticular fracture of lower end of left radius, subsequent encounter for closed fracture with routine healing: Secondary | ICD-10-CM | POA: Diagnosis not present

## 2017-06-29 DIAGNOSIS — S52572D Other intraarticular fracture of lower end of left radius, subsequent encounter for closed fracture with routine healing: Secondary | ICD-10-CM | POA: Diagnosis not present

## 2017-07-03 DIAGNOSIS — F17210 Nicotine dependence, cigarettes, uncomplicated: Secondary | ICD-10-CM | POA: Diagnosis not present

## 2017-07-03 DIAGNOSIS — N390 Urinary tract infection, site not specified: Secondary | ICD-10-CM | POA: Diagnosis not present

## 2017-07-03 DIAGNOSIS — S52572D Other intraarticular fracture of lower end of left radius, subsequent encounter for closed fracture with routine healing: Secondary | ICD-10-CM | POA: Diagnosis not present

## 2017-07-03 DIAGNOSIS — H60553 Acute reactive otitis externa, bilateral: Secondary | ICD-10-CM | POA: Diagnosis not present

## 2017-07-03 DIAGNOSIS — Z23 Encounter for immunization: Secondary | ICD-10-CM | POA: Diagnosis not present

## 2017-07-05 DIAGNOSIS — S52572D Other intraarticular fracture of lower end of left radius, subsequent encounter for closed fracture with routine healing: Secondary | ICD-10-CM | POA: Diagnosis not present

## 2017-07-05 DIAGNOSIS — Z23 Encounter for immunization: Secondary | ICD-10-CM | POA: Diagnosis not present

## 2017-07-10 DIAGNOSIS — S52572D Other intraarticular fracture of lower end of left radius, subsequent encounter for closed fracture with routine healing: Secondary | ICD-10-CM | POA: Diagnosis not present

## 2017-07-12 DIAGNOSIS — S52572D Other intraarticular fracture of lower end of left radius, subsequent encounter for closed fracture with routine healing: Secondary | ICD-10-CM | POA: Diagnosis not present

## 2017-07-17 DIAGNOSIS — S52572D Other intraarticular fracture of lower end of left radius, subsequent encounter for closed fracture with routine healing: Secondary | ICD-10-CM | POA: Diagnosis not present

## 2017-07-24 DIAGNOSIS — S52572D Other intraarticular fracture of lower end of left radius, subsequent encounter for closed fracture with routine healing: Secondary | ICD-10-CM | POA: Diagnosis not present

## 2017-07-26 DIAGNOSIS — J449 Chronic obstructive pulmonary disease, unspecified: Secondary | ICD-10-CM | POA: Diagnosis not present

## 2017-07-26 DIAGNOSIS — G4710 Hypersomnia, unspecified: Secondary | ICD-10-CM | POA: Diagnosis not present

## 2017-07-26 DIAGNOSIS — S52572D Other intraarticular fracture of lower end of left radius, subsequent encounter for closed fracture with routine healing: Secondary | ICD-10-CM | POA: Diagnosis not present

## 2017-07-26 DIAGNOSIS — G4733 Obstructive sleep apnea (adult) (pediatric): Secondary | ICD-10-CM | POA: Diagnosis not present

## 2017-08-09 ENCOUNTER — Other Ambulatory Visit: Payer: Self-pay | Admitting: Internal Medicine

## 2017-08-09 ENCOUNTER — Other Ambulatory Visit: Payer: Self-pay

## 2017-08-09 MED ORDER — IBANDRONATE SODIUM 150 MG PO TABS: 150.0000 mg | ORAL_TABLET | ORAL | 0 refills | Status: DC

## 2017-08-14 DIAGNOSIS — N200 Calculus of kidney: Secondary | ICD-10-CM | POA: Diagnosis not present

## 2017-08-14 DIAGNOSIS — N2889 Other specified disorders of kidney and ureter: Secondary | ICD-10-CM | POA: Diagnosis not present

## 2017-08-14 DIAGNOSIS — Z6828 Body mass index (BMI) 28.0-28.9, adult: Secondary | ICD-10-CM | POA: Diagnosis not present

## 2017-08-14 DIAGNOSIS — N3020 Other chronic cystitis without hematuria: Secondary | ICD-10-CM | POA: Diagnosis not present

## 2017-08-14 DIAGNOSIS — R350 Frequency of micturition: Secondary | ICD-10-CM | POA: Diagnosis not present

## 2017-08-14 DIAGNOSIS — K5900 Constipation, unspecified: Secondary | ICD-10-CM | POA: Diagnosis not present

## 2017-08-14 DIAGNOSIS — Q615 Medullary cystic kidney: Secondary | ICD-10-CM | POA: Diagnosis not present

## 2017-08-16 ENCOUNTER — Other Ambulatory Visit: Payer: Self-pay

## 2017-08-16 ENCOUNTER — Other Ambulatory Visit: Payer: Self-pay | Admitting: Nurse Practitioner

## 2017-08-16 MED ORDER — IBANDRONATE SODIUM 150 MG PO TABS: 150.0000 mg | ORAL_TABLET | ORAL | 0 refills | Status: DC

## 2017-09-04 DIAGNOSIS — H903 Sensorineural hearing loss, bilateral: Secondary | ICD-10-CM | POA: Diagnosis not present

## 2017-09-04 DIAGNOSIS — H6122 Impacted cerumen, left ear: Secondary | ICD-10-CM | POA: Diagnosis not present

## 2017-09-04 DIAGNOSIS — H6530 Chronic mucoid otitis media, unspecified ear: Secondary | ICD-10-CM | POA: Diagnosis not present

## 2017-09-05 DIAGNOSIS — I872 Venous insufficiency (chronic) (peripheral): Secondary | ICD-10-CM | POA: Diagnosis not present

## 2017-09-05 DIAGNOSIS — L905 Scar conditions and fibrosis of skin: Secondary | ICD-10-CM | POA: Diagnosis not present

## 2017-09-12 DIAGNOSIS — I872 Venous insufficiency (chronic) (peripheral): Secondary | ICD-10-CM | POA: Diagnosis not present

## 2017-09-18 ENCOUNTER — Other Ambulatory Visit: Payer: Self-pay | Admitting: Nurse Practitioner

## 2017-09-18 ENCOUNTER — Ambulatory Visit: Payer: Medicare Other

## 2017-09-18 DIAGNOSIS — R319 Hematuria, unspecified: Secondary | ICD-10-CM

## 2017-09-18 DIAGNOSIS — N390 Urinary tract infection, site not specified: Secondary | ICD-10-CM | POA: Diagnosis not present

## 2017-09-18 DIAGNOSIS — R300 Dysuria: Secondary | ICD-10-CM

## 2017-09-18 LAB — POCT URINALYSIS DIPSTICK
GLUCOSE UA: NEGATIVE
Nitrite, UA: NEGATIVE
Protein, UA: NEGATIVE
Spec Grav, UA: 1.02 (ref 1.010–1.025)
Urobilinogen, UA: 0.2 E.U./dL
pH, UA: 5 (ref 5.0–8.0)

## 2017-09-18 MED ORDER — CIPROFLOXACIN HCL 500 MG PO TABS: 500.0000 mg | ORAL_TABLET | Freq: Two times a day (BID) | ORAL | 0 refills | Status: DC

## 2017-09-18 NOTE — Progress Notes (Signed)
Sent in ciprofloxacin 500mg  twice daily for 10 days.

## 2017-09-21 ENCOUNTER — Ambulatory Visit (INDEPENDENT_AMBULATORY_CARE_PROVIDER_SITE_OTHER): Payer: Medicare Other | Admitting: Internal Medicine

## 2017-09-21 ENCOUNTER — Other Ambulatory Visit: Payer: Self-pay | Admitting: Internal Medicine

## 2017-09-21 DIAGNOSIS — G4710 Hypersomnia, unspecified: Secondary | ICD-10-CM

## 2017-09-21 LAB — CULTURE, URINE COMPREHENSIVE

## 2017-09-23 ENCOUNTER — Other Ambulatory Visit: Payer: Self-pay | Admitting: Nurse Practitioner

## 2017-09-23 DIAGNOSIS — N3001 Acute cystitis with hematuria: Secondary | ICD-10-CM

## 2017-09-23 MED ORDER — NITROFURANTOIN MONOHYD MACRO 100 MG PO CAPS: 100.0000 mg | ORAL_CAPSULE | Freq: Two times a day (BID) | ORAL | 0 refills | Status: DC

## 2017-09-24 ENCOUNTER — Other Ambulatory Visit: Payer: Self-pay | Admitting: Internal Medicine

## 2017-10-04 ENCOUNTER — Other Ambulatory Visit: Payer: Self-pay | Admitting: Nurse Practitioner

## 2017-10-04 ENCOUNTER — Ambulatory Visit: Payer: Medicare Other | Admitting: Internal Medicine

## 2017-10-04 ENCOUNTER — Encounter: Payer: Self-pay | Admitting: Internal Medicine

## 2017-10-04 VITALS — BP 114/63 | HR 71 | Resp 16 | Ht 66.0 in | Wt 186.0 lb

## 2017-10-04 DIAGNOSIS — E119 Type 2 diabetes mellitus without complications: Secondary | ICD-10-CM | POA: Insufficient documentation

## 2017-10-04 DIAGNOSIS — N3001 Acute cystitis with hematuria: Secondary | ICD-10-CM

## 2017-10-04 DIAGNOSIS — K219 Gastro-esophageal reflux disease without esophagitis: Secondary | ICD-10-CM | POA: Diagnosis not present

## 2017-10-04 DIAGNOSIS — J449 Chronic obstructive pulmonary disease, unspecified: Secondary | ICD-10-CM | POA: Diagnosis not present

## 2017-10-04 DIAGNOSIS — M810 Age-related osteoporosis without current pathological fracture: Secondary | ICD-10-CM | POA: Insufficient documentation

## 2017-10-04 DIAGNOSIS — G4730 Sleep apnea, unspecified: Secondary | ICD-10-CM | POA: Diagnosis not present

## 2017-10-04 MED ORDER — DOXYCYCLINE HYCLATE 100 MG PO TABS: 100.0000 mg | ORAL_TABLET | Freq: Two times a day (BID) | ORAL | 0 refills | Status: DC

## 2017-10-04 NOTE — Addendum Note (Signed)
Addended by: Devona Konig on: 10/04/2017 12:32 PM   Modules accepted: Orders

## 2017-10-04 NOTE — Patient Instructions (Signed)

## 2017-10-04 NOTE — Procedures (Signed)
[facility] [place], [place] [address]  Sleep Specialist: Allyne Gee, MD [place] Sleep Study Interpretation  Patient Name: Amber Holder Patient MR CNOBSJ:[id number] DOB:[birth year]  Indications for study:  BMI: 29.8     Excessive Daytime sleepiness, Snoring  Respiratory Data:  Total Respiratory Disturbance Index:  2.9  Total Obstructive Apneas:  1  Total Hypopneas:  7  Total Central Apneas:  0  If the AHI is greater than 5 per hour patient qualifies for PAP evaluation  Oximetry Data:  Oxygen Desaturation Index: 3.5  Lowest Desaturation:  83%  Cardiac Data:  Minimum Heart Rate:  67  Maximum Heart Rate:  97   Impression / Diagnosis:   this home sleep study demonstrates absence of significant obstructive sleep apnea-hypopnea syndrome with an apnea-hypopnea index of 2.9.  The patient does have significant oxygen desaturations noted with the lowest oxygen desaturation of 83%.  Patient may benefit from overnight oxygen use 0 8 overnight oximetry is suggested.    This Level III home sleep study was performed using the [employer], a 4 channel screening device subject to limitations. Depending on actual total sleep time, not measured in this study, the AHI (sum of apneas and hypopneas/hr of sleep) and therefore the severity of sleep apnea may be underestimated. As with any single night study, including Level 1 attended PSG, severity of sleep apnea may also be underestimated due to the lack of supine and/or REM sleep.  The interpretation associated with this report is based on normal values and degrees of severity in accordance with AASM parameters and/or estimated from multiple sources in the literature for adults ages 3-80+. These may not agree with the displayed values. The patient's treating physician should use the interpretation and recommendations in conjunction with the overall clinical evaluation and treatment of the  patient.  Some of the terminology used in this scored [facility]nea[facility] report was developed several years ago and may not always be in accordance with current nomenclature. This in no way affects the accuracy of the data or the reliability of the interpretation and recommendations.

## 2017-10-04 NOTE — Progress Notes (Signed)
Changed antibiotic to doxycycline 100mg  bid for 10 days and sent to [facility]

## 2017-10-04 NOTE — Progress Notes (Signed)
[facility] [place], [place] [address]  Pulmonary Sleep Medicine  Office Visit Note  Patient Name: Amber Holder DOB: 1945-12-09 MRN [id number]  Date of Service: 10/04/2017  Complaints/HPI: She had a sleep study done at home. Looks good with an AHI of 2.9 but she did have significant desaturation with low of 83-88%. She has no cough noted no wheeze noted. Overall base on this study she is doing well  ROS  General: (-) fever, (-) chills, (-) night sweats, (-) weakness Skin: (-) rashes, (-) itching,. Eyes: (-) visual changes, (-) redness, (-) itching. Nose and Sinuses: (-) nasal stuffiness or itchiness, (-) postnasal drip, (-) nosebleeds, (-) sinus trouble. Mouth and Throat: (-) sore throat, (-) hoarseness. Neck: (-) swollen glands, (-) enlarged thyroid, (-) neck pain. Respiratory: - cough, (-) bloody sputum, - shortness of breath, - wheezing. Cardiovascular: - ankle swelling, (-) chest pain. Lymphatic: (-) lymph node enlargement. Neurologic: (-) numbness, (-) tingling. Psychiatric: (-) anxiety, (-) depression   Current Medication: Outpatient Encounter Medications as of 10/04/2017  Medication Sig Note  . acetaminophen (TYLENOL) 500 MG chewable tablet Chew 1,000 mg by mouth every 8 (eight) hours as needed for pain.   Marland Kitchen albuterol (PROVENTIL HFA;VENTOLIN HFA) 108 (90 BASE) MCG/ACT inhaler Inhale 2 puffs into the lungs every 6 (six) hours as needed for wheezing or shortness of breath.   . Carboxymethylcell-Hypromellose (GENTEAL) 0.25-0.3 % GEL Apply 1 drop to eye as needed.   . cholecalciferol (VITAMIN D) 400 UNITS TABS tablet Take 2,000 Units by mouth daily.    Marland Kitchen enoxaparin (LOVENOX) 40 MG/0.4ML injection Inject 0.4 mLs (40 mg total) into the skin daily.   . furosemide (LASIX) 20 MG tablet TAKE 1 TABLET BY MOUTH DAILY   . ibandronate (BONIVA) 150 MG tablet Take 1 tablet (150 mg total) by mouth every 30 (thirty) days.   . meloxicam (MOBIC) 7.5 MG tablet Take 1  tablet (7.5 mg total) by mouth as needed for pain.   . metFORMIN (GLUCOPHAGE) 500 MG tablet Take 250 mg by mouth daily with breakfast.    . montelukast (SINGULAIR) 10 MG tablet Take 10 mg by mouth as needed.    . nitrofurantoin, macrocrystal-monohydrate, (MACROBID) 100 MG capsule Take 1 capsule (100 mg total) by mouth 2 (two) times daily.   Marland Kitchen omega-3 acid ethyl esters (LOVAZA) 1 G capsule Take by mouth daily. 05/31/2016: Not taking  . potassium chloride (KLOR-CON) 8 MEQ tablet Take 8 mEq by mouth as needed (only take when taking Lasix).    . tapentadol (NUCYNTA) 50 MG tablet Take 1-2 tablets (50-100 mg total) by mouth every 4 (four) hours as needed for moderate pain.   Marland Kitchen tiotropium (SPIRIVA) 18 MCG inhalation capsule Place 18 mcg into inhaler and inhale daily.    No facility-administered encounter medications on file as of 10/04/2017.     Surgical History: Past Surgical History:  Procedure Laterality Date  . APPENDECTOMY    . cataract surgery Bilateral   . CHOLECYSTECTOMY    . COLONOSCOPY    . COLONOSCOPY WITH PROPOFOL N/A 05/28/2015   Procedure: COLONOSCOPY WITH PROPOFOL;  Surgeon: Lollie Sails, MD;  Location: [facility] ENDOSCOPY;  Service: Endoscopy;  Laterality: N/A;  . EYE SURGERY Bilateral 2015   Cataract Extraction with IOL  . LITHOTRIPSY  2015   has had many stones  . Mastoidotomy  1970  . ROTATOR CUFF REPAIR Right 2011  . TOTAL HIP ARTHROPLASTY Right 06/14/2016   Procedure: TOTAL HIP ARTHROPLASTY;  Surgeon:  Dereck Leep, MD;  Location: [facility] ORS;  Service: Orthopedics;  Laterality: Right;    Medical History: Past Medical History:  Diagnosis Date  . Anxiety 01/02/2012  . Arthritis   . Asthma   . Chronic kidney disease    Kidney stones  . COPD (chronic obstructive pulmonary disease) ([place])   . Diabetes mellitus without complication ([place])   . GERD (gastroesophageal reflux disease)   . HOH (hard of hearing)    Bilateral hearing aids  . Osteoporosis   . Sleep apnea    Does  not use C-PAP on a regular basis  . Venous stasis     Family History: Family History  Problem Relation Age of Onset  . Breast cancer Maternal Aunt 50    Social History: Social History   Socioeconomic History  . Marital status: Divorced    Spouse name: Not on file  . Number of children: Not on file  . Years of education: Not on file  . Highest education level: Not on file  Social Needs  . Financial resource strain: Not on file  . Food insecurity - worry: Not on file  . Food insecurity - inability: Not on file  . Transportation needs - medical: Not on file  . Transportation needs - non-medical: Not on file  Occupational History  . Not on file  Tobacco Use  . Smoking status: Current Every Day Smoker    Packs/day: 1.00    Types: Cigarettes  . Smokeless tobacco: Never Used  Substance and Sexual Activity  . Alcohol use: Yes    Comment: occassional  . Drug use: No  . Sexual activity: No  Other Topics Concern  . Not on file  Social History Narrative  . Not on file    Vital Signs: Blood pressure 114/63, pulse 71, resp. rate 16, height 5\' 6"  (1.676 m), weight 186 lb (84.4 kg), SpO2 94 %.  Examination: General Appearance: The patient is well-developed, well-nourished, and in no distress. Skin: Gross inspection of skin unremarkable. Head: normocephalic, no gross deformities. Eyes: no gross deformities noted. ENT: ears appear grossly normal no exudates. Neck: Supple. No thyromegaly. No LAD. Respiratory: clear. Cardiovascular: Normal S1 and S2 without murmur or rub. Extremities: No cyanosis. pulses are equal. Neurologic: Alert and oriented. No involuntary movements.  LABS: Recent Results (from the past 2160 hour(s))  CULTURE, URINE COMPREHENSIVE     Status: Abnormal   Collection Time: 09/18/17  3:48 PM  Result Value Ref Range   Urine Culture, Comprehensive Final report (A)    Organism ID, Bacteria Escherichia coli (A)     Comment: 25,000-50,000 colony forming units  per mL Cefazolin <=4 ug/mL Cefazolin with an MIC <=16 predicts susceptibility to the oral agents cefaclor, cefdinir, cefpodoxime, cefprozil, cefuroxime, cephalexin, and loracarbef when used for therapy of uncomplicated urinary tract infections due to E. coli, Klebsiella pneumoniae, and Proteus mirabilis.    ANTIMICROBIAL SUSCEPTIBILITY Comment     Comment:       ** S = Susceptible; I = Intermediate; R = Resistant **                    P = Positive; N = Negative             MICS are expressed in micrograms per mL    Antibiotic                 RSLT#1    RSLT#2    RSLT#3    RSLT#4 Amoxicillin/Clavulanic  Acid    I Ampicillin                     R Cefepime                       S Ceftriaxone                    S Cefuroxime                     I Ciprofloxacin                  R Ertapenem                      S Gentamicin                     S Imipenem                       S Levofloxacin                   R Meropenem                      S Nitrofurantoin                 S Piperacillin/Tazobactam        S Tetracycline                   S Tobramycin                     S Trimethoprim/Sulfa             S   POCT Urinalysis Dipstick     Status: Abnormal   Collection Time: 09/18/17  4:45 PM  Result Value Ref Range   Color, UA     Clarity, UA     Glucose, UA negative    Bilirubin, UA small    Ketones, UA small    Spec Grav, UA 1.020 1.010 - 1.025   Blood, UA moderate    pH, UA 5.0 5.0 - 8.0   Protein, UA negative    Urobilinogen, UA 0.2 0.2 or 1.0 E.U./dL   Nitrite, UA negative    Leukocytes, UA Moderate (2+) (A) Negative   Appearance     Odor      Radiology: Dg Wrist Complete Left  Result Date: 02/27/2017 CLINICAL DATA:  Acute left wrist pain and swelling following fall. Initial encounter. EXAM: LEFT WRIST - COMPLETE 3+ VIEW COMPARISON:  None. FINDINGS: A minimally displaced posterior intraarticular distal radial fracture is noted. An ulnar styloid fracture is present. No  subluxation or dislocation noted. Moderate degenerative changes at the first carpometacarpal joint identified. IMPRESSION: Posterior intraarticular distal radial fracture and ulnar styloid fracture. No dislocation. Electronically Signed   By: Margarette Canada M.D.   On: 02/27/2017 15:36    No results found.  No results found.    Assessment and Plan: Patient Active Problem List   Diagnosis Date Noted  . Diabetes mellitus type 2, uncomplicated ([place]) 54/62/7035  . Osteoporosis 10/04/2017  . S/P total hip arthroplasty 06/14/2016  . Inguinal hernia, left 06/09/2013  . Umbilical hernia 00/93/8182  . Increased frequency of urination 11/27/2012  . Chronic cystitis 11/19/2012  . Incomplete emptying of bladder 11/19/2012  . Medullary sponge kidney 11/19/2012  .  Mixed urge and stress incontinence 11/19/2012  . Anxiety 01/02/2012  . Calculus of kidney 01/02/2012  . COPD (chronic obstructive pulmonary disease) ([place]) 01/02/2012  . GERD (gastroesophageal reflux disease) 01/02/2012  . Hearing loss 01/02/2012  . Hyperlipidemia, unspecified 01/02/2012  . Obesity, unspecified 01/02/2012  . Sleep apnea 01/02/2012  . Tobacco abuse 01/02/2012  . Venous insufficiency 01/02/2012    1. OSA based on this study home test she did well and does not need to be on CPAP. She has lost weight and this likely helped. She does have desaturation noted and may need O2 setup 2. COPD stable at this time will continue with current management 3. GERD controlled will monitor  General Counseling: I have discussed the findings of the evaluation and examination with Ambrea.  I have also discussed any further diagnostic evaluation thatmay be needed or ordered today. Zur[place] verbalizes understanding of the findings of todays visit. We also reviewed her medications today and discussed drug interactions and side effects including but not limited excessive drowsiness and altered mental states. We also discussed that there is always a  risk not just to her but also people around her. she has been encouraged to call the office with any questions or concerns that should arise related to todays visit.    Time spent: 2min  I have personally obtained a history, examined the patient, evaluated laboratory and imaging results, formulated the assessment and plan and placed orders.    Allyne Gee, MD [facility] Pulmonary and Critical Care Sleep medicine

## 2017-11-01 ENCOUNTER — Ambulatory Visit (INDEPENDENT_AMBULATORY_CARE_PROVIDER_SITE_OTHER): Payer: Medicare Other | Admitting: Nurse Practitioner

## 2017-11-01 ENCOUNTER — Other Ambulatory Visit: Payer: Self-pay | Admitting: Internal Medicine

## 2017-11-01 ENCOUNTER — Encounter: Payer: Self-pay | Admitting: Nurse Practitioner

## 2017-11-01 VITALS — BP 130/80 | HR 78 | Resp 16 | Ht 66.0 in | Wt 189.0 lb

## 2017-11-01 DIAGNOSIS — R300 Dysuria: Secondary | ICD-10-CM | POA: Diagnosis not present

## 2017-11-01 DIAGNOSIS — R319 Hematuria, unspecified: Secondary | ICD-10-CM

## 2017-11-01 DIAGNOSIS — E1165 Type 2 diabetes mellitus with hyperglycemia: Secondary | ICD-10-CM | POA: Diagnosis not present

## 2017-11-01 DIAGNOSIS — J449 Chronic obstructive pulmonary disease, unspecified: Secondary | ICD-10-CM | POA: Diagnosis not present

## 2017-11-01 DIAGNOSIS — N390 Urinary tract infection, site not specified: Secondary | ICD-10-CM

## 2017-11-01 DIAGNOSIS — R0902 Hypoxemia: Secondary | ICD-10-CM

## 2017-11-01 LAB — POCT URINALYSIS DIPSTICK
BILIRUBIN UA: NEGATIVE
GLUCOSE UA: NEGATIVE
Ketones, UA: NEGATIVE
NITRITE UA: NEGATIVE
Protein, UA: NEGATIVE
Spec Grav, UA: 1.02 (ref 1.010–1.025)
UROBILINOGEN UA: 0.2 U/dL
pH, UA: 5 (ref 5.0–8.0)

## 2017-11-01 LAB — POCT GLYCOSYLATED HEMOGLOBIN (HGB A1C): HEMOGLOBIN A1C: 5.8

## 2017-11-01 MED ORDER — CIPROFLOXACIN HCL 500 MG PO TABS: 500.0000 mg | ORAL_TABLET | Freq: Two times a day (BID) | ORAL | 0 refills | Status: DC

## 2017-11-01 NOTE — Progress Notes (Signed)
Oxygen

## 2017-11-01 NOTE — Progress Notes (Signed)
[facility] [place], [place] [address]  Internal MEDICINE  Office Visit Note  Patient Name: Amber Holder  [id number]  [id number]  Date of Service: 11/21/2017  Chief Complaint  Patient presents with  . Urinary Tract Infection  . Diabetes    The patient is here for routine follow up exam. She states that her feet are turning a blueish color and feel very cold, often.   Urinary Tract Infection   This is a recurrent problem. The current episode started in the past 7 days. The problem occurs intermittently. The problem has been unchanged. The quality of the pain is described as burning and aching. The pain is moderate. There has been no fever. She is not sexually active. There is a history of pyelonephritis. Associated symptoms include chills, flank pain, frequency and nausea. She has tried antibiotics for the symptoms. The treatment provided moderate relief. Her past medical history is significant for recurrent UTIs.  Diabetes  She presents for her follow-up diabetic visit. She has type 2 diabetes mellitus. No MedicAlert identification noted. Her disease course has been stable. Hypoglycemia symptoms include nervousness/anxiousness. Pertinent negatives for hypoglycemia include no dizziness or headaches. Associated symptoms include fatigue. There are no hypoglycemic complications. Symptoms are stable. Diabetic complications include peripheral neuropathy and PVD. Risk factors for coronary artery disease include diabetes mellitus, dyslipidemia, hypertension and post-menopausal. She is compliant with treatment all of the time. Her weight is stable. She is following a low fat/cholesterol diet. Meal planning includes avoidance of concentrated sweets. She has not had a previous visit with a dietitian. She rarely participates in exercise. There is no change in her home blood glucose trend. An ACE inhibitor/angiotensin II receptor blocker is being taken. She sees a podiatrist.Eye  exam is current.    Pt is here for routine follow up.    Current Medication: Outpatient Encounter Medications as of 11/01/2017  Medication Sig Note  . acetaminophen (TYLENOL) 500 MG chewable tablet Chew 1,000 mg by mouth every 8 (eight) hours as needed for pain.   Marland Kitchen albuterol (PROVENTIL HFA;VENTOLIN HFA) 108 (90 BASE) MCG/ACT inhaler Inhale 2 puffs into the lungs every 6 (six) hours as needed for wheezing or shortness of breath.   . Carboxymethylcell-Hypromellose (GENTEAL) 0.25-0.3 % GEL Apply 1 drop to eye as needed.   . cholecalciferol (VITAMIN D) 400 UNITS TABS tablet Take 2,000 Units by mouth daily.    . ciprofloxacin (CIPRO) 500 MG tablet Take 1 tablet (500 mg total) by mouth 2 (two) times daily.   Marland Kitchen doxycycline (VIBRA-TABS) 100 MG tablet Take 1 tablet (100 mg total) by mouth 2 (two) times daily.   Marland Kitchen enoxaparin (LOVENOX) 40 MG/0.4ML injection Inject 0.4 mLs (40 mg total) into the skin daily.   . furosemide (LASIX) 20 MG tablet TAKE 1 TABLET BY MOUTH DAILY   . ibandronate (BONIVA) 150 MG tablet Take 1 tablet (150 mg total) by mouth every 30 (thirty) days.   . meloxicam (MOBIC) 7.5 MG tablet Take 1 tablet (7.5 mg total) by mouth as needed for pain.   . metFORMIN (GLUCOPHAGE) 500 MG tablet Take 250 mg by mouth daily with breakfast.    . montelukast (SINGULAIR) 10 MG tablet Take 10 mg by mouth as needed.    Marland Kitchen omega-3 acid ethyl esters (LOVAZA) 1 G capsule Take by mouth daily. 05/31/2016: Not taking  . potassium chloride (KLOR-CON) 8 MEQ tablet Take 8 mEq by mouth as needed (only take when taking Lasix).    . tapentadol (  NUCYNTA) 50 MG tablet Take 1-2 tablets (50-100 mg total) by mouth every 4 (four) hours as needed for moderate pain.   Marland Kitchen tiotropium (SPIRIVA) 18 MCG inhalation capsule Place 18 mcg into inhaler and inhale daily.   . [DISCONTINUED] nitrofurantoin, macrocrystal-monohydrate, (MACROBID) 100 MG capsule Take 1 capsule (100 mg total) by mouth 2 (two) times daily.    No  facility-administered encounter medications on file as of 11/01/2017.     Surgical History: Past Surgical History:  Procedure Laterality Date  . APPENDECTOMY    . cataract surgery Bilateral   . CHOLECYSTECTOMY    . COLONOSCOPY    . COLONOSCOPY WITH PROPOFOL N/A 05/28/2015   Procedure: COLONOSCOPY WITH PROPOFOL;  Surgeon: Lollie Sails, MD;  Location: [facility] ENDOSCOPY;  Service: Endoscopy;  Laterality: N/A;  . EYE SURGERY Bilateral 2015   Cataract Extraction with IOL  . LITHOTRIPSY  2015   has had many stones  . Mastoidotomy  1970  . ROTATOR CUFF REPAIR Right 2011  . TOTAL HIP ARTHROPLASTY Right 06/14/2016   Procedure: TOTAL HIP ARTHROPLASTY;  Surgeon: Dereck Leep, MD;  Location: [facility] ORS;  Service: Orthopedics;  Laterality: Right;    Medical History: Past Medical History:  Diagnosis Date  . Anxiety 01/02/2012  . Arthritis   . Asthma   . Chronic kidney disease    Kidney stones  . COPD (chronic obstructive pulmonary disease) ([place])   . Diabetes mellitus without complication ([place])   . GERD (gastroesophageal reflux disease)   . HOH (hard of hearing)    Bilateral hearing aids  . Osteoporosis   . Sleep apnea    Does not use C-PAP on a regular basis  . Venous stasis     Family History: Family History  Problem Relation Age of Onset  . Breast cancer Maternal Aunt 28    Social History   Socioeconomic History  . Marital status: Divorced    Spouse name: Not on file  . Number of children: Not on file  . Years of education: Not on file  . Highest education level: Not on file  Occupational History  . Not on file  Social Needs  . Financial resource strain: Not on file  . Food insecurity:    Worry: Not on file    Inability: Not on file  . Transportation needs:    Medical: Not on file    Non-medical: Not on file  Tobacco Use  . Smoking status: Current Every Day Smoker    Packs/day: 1.00    Types: Cigarettes  . Smokeless tobacco: Never Used  Substance and Sexual  Activity  . Alcohol use: Yes    Comment: occassional  . Drug use: No  . Sexual activity: Never  Lifestyle  . Physical activity:    Days per week: Not on file    Minutes per session: Not on file  . Stress: Not on file  Relationships  . Social connections:    Talks on phone: Not on file    Gets together: Not on file    Attends religious service: Not on file    Active member of club or organization: Not on file    Attends meetings of clubs or organizations: Not on file    Relationship status: Not on file  . Intimate partner violence:    Fear of current or ex partner: Not on file    Emotionally abused: Not on file    Physically abused: Not on file    Forced sexual activity:  Not on file  Other Topics Concern  . Not on file  Social History Narrative  . Not on file      Review of Systems  Constitutional: Positive for chills and fatigue.  HENT: Negative for congestion, postnasal drip, rhinorrhea and voice change.   Eyes: Negative.   Cardiovascular: Positive for leg swelling.       Cold feet, darker in color. Gradually getting worse.   Gastrointestinal: Positive for nausea.  Endocrine:       Blood sugars doing well   Genitourinary: Positive for flank pain and frequency.  Musculoskeletal: Positive for back pain.  Skin: Negative for rash.  Allergic/Immunologic: Positive for environmental allergies.  Neurological: Negative for dizziness and headaches.  Hematological: Negative for adenopathy.  Psychiatric/Behavioral: Negative for behavioral problems and dysphoric mood. The patient is nervous/anxious.     Vital Signs: BP 130/80   Pulse 78   Resp 16   Ht 5\' 6"  (1.676 m)   Wt 189 lb (85.7 kg)   SpO2 98%   BMI 30.51 kg/m    Physical Exam  Constitutional: She is oriented to person, place, and time. She appears well-developed and well-nourished. No distress.  HENT:  Head: Normocephalic and atraumatic.  Mouth/Throat: Oropharynx is clear and moist. No oropharyngeal exudate.   Eyes: Pupils are equal, round, and reactive to light. EOM are normal.  Neck: Normal range of motion. Neck supple. No JVD present. Carotid bruit is not present. No tracheal deviation present. No thyromegaly present.  Cardiovascular: Normal rate, normal heart sounds and intact distal pulses. Exam reveals no gallop and no friction rub.  No murmur heard. Irregular heart rhythm   Pulmonary/Chest: Effort normal and breath sounds normal. No respiratory distress. She has no wheezes. She has no rales. She exhibits no tenderness.  Abdominal: Soft. Bowel sounds are normal. There is no tenderness.  Genitourinary:  Genitourinary Comments: Urine sample positive for moderate blood and moderate WBC. She has moderate bilateral flank pain.   Musculoskeletal: Normal range of motion.  Lymphadenopathy:    She has no cervical adenopathy.  Neurological: She is alert and oriented to person, place, and time. No cranial nerve deficit.  Skin: Skin is warm and dry. She is not diaphoretic.  Psychiatric: She has a normal mood and affect. Her behavior is normal. Judgment and thought content normal.  Nursing note and vitals reviewed.    Assessment/Plan: 1. Urinary tract infection with hematuria, site unspecified Start cipro 500mg  bid for 10 days. Send for culture and sensitivity and adjust abx as indicated.  - ciprofloxacin (CIPRO) 500 MG tablet; Take 1 tablet (500 mg total) by mouth 2 (two) times daily.  Dispense: 20 tablet; Refill: 0  2. Dysuria OTC AZO recommended to reduce bladder pain. - POCT Urinalysis Dipstick - CULTURE, URINE COMPREHENSIVE  3. Uncontrolled type 2 diabetes mellitus with hyperglycemia (HCC) - POCT HgB A1C 5.8 today. Continue diabetic medication as prescribed.    4. Chronic obstructive pulmonary disease, unspecified COPD type ([place]) Well managed. Continue to use inhalers as prescribed.    General Counseling: Alfretta verbalizes understanding of the findings of todays visit and agrees with  plan of treatment. I have discussed any further diagnostic evaluation that may be needed or ordered today. We also reviewed her medications today. she has been encouraged to call the office with any questions or concerns that should arise related to todays visit.  This patient was seen by Leretha Pol, FNP- C in Collaboration with Dr Lavera Guise as a part of  collaborative care agreement   Orders Placed This Encounter  Procedures  . CULTURE, URINE COMPREHENSIVE  . POCT Urinalysis Dipstick  . POCT HgB A1C    Meds ordered this encounter  Medications  . ciprofloxacin (CIPRO) 500 MG tablet    Sig: Take 1 tablet (500 mg total) by mouth 2 (two) times daily.    Dispense:  20 tablet    Refill:  0    Order Specific Question:   Supervising Provider    Answer:   Lavera Guise [[id number]]    Time spent: 63 Minutes   Dr Lavera Guise Internal medicine

## 2017-11-02 ENCOUNTER — Telehealth: Payer: Self-pay

## 2017-11-02 NOTE — Telephone Encounter (Signed)
Faxed o2 night order to [place] home patient and put in box to be picked up due to abnormal pox results/ Per DSK and patient no longer needs or using cpap machine but will need nightly o2/ br

## 2017-11-04 LAB — CULTURE, URINE COMPREHENSIVE

## 2017-11-08 ENCOUNTER — Other Ambulatory Visit: Payer: Self-pay | Admitting: Nurse Practitioner

## 2017-11-08 ENCOUNTER — Telehealth: Payer: Self-pay

## 2017-11-08 DIAGNOSIS — N3001 Acute cystitis with hematuria: Secondary | ICD-10-CM

## 2017-11-08 MED ORDER — NITROFURANTOIN MONOHYD MACRO 100 MG PO CAPS: 100.0000 mg | ORAL_CAPSULE | Freq: Two times a day (BID) | ORAL | 0 refills | Status: DC

## 2017-11-08 NOTE — Telephone Encounter (Signed)
Pt walkin today for result for ua as per heather advised pt she can stopped cipro and we send macrobid sh is resistant to cipro/np

## 2017-11-08 NOTE — Progress Notes (Signed)
D/c ciprofloxacin 500mg  bid 0 start macrobid 100mg  twice daily for 10 days. New rx sent to [facility]. Patient notified in the office per nimisha.

## 2017-11-21 DIAGNOSIS — E1165 Type 2 diabetes mellitus with hyperglycemia: Secondary | ICD-10-CM | POA: Insufficient documentation

## 2017-11-21 DIAGNOSIS — R300 Dysuria: Secondary | ICD-10-CM | POA: Insufficient documentation

## 2017-11-21 DIAGNOSIS — N390 Urinary tract infection, site not specified: Secondary | ICD-10-CM | POA: Insufficient documentation

## 2017-12-18 DIAGNOSIS — R339 Retention of urine, unspecified: Secondary | ICD-10-CM | POA: Diagnosis not present

## 2017-12-18 DIAGNOSIS — R3129 Other microscopic hematuria: Secondary | ICD-10-CM | POA: Insufficient documentation

## 2017-12-18 DIAGNOSIS — N200 Calculus of kidney: Secondary | ICD-10-CM | POA: Diagnosis not present

## 2017-12-18 DIAGNOSIS — Z6830 Body mass index (BMI) 30.0-30.9, adult: Secondary | ICD-10-CM | POA: Diagnosis not present

## 2017-12-18 DIAGNOSIS — N23 Unspecified renal colic: Secondary | ICD-10-CM | POA: Diagnosis not present

## 2017-12-18 DIAGNOSIS — N3020 Other chronic cystitis without hematuria: Secondary | ICD-10-CM | POA: Diagnosis not present

## 2017-12-19 DIAGNOSIS — H02055 Trichiasis without entropian left lower eyelid: Secondary | ICD-10-CM | POA: Diagnosis not present

## 2017-12-19 DIAGNOSIS — H04123 Dry eye syndrome of bilateral lacrimal glands: Secondary | ICD-10-CM | POA: Diagnosis not present

## 2017-12-20 DIAGNOSIS — N200 Calculus of kidney: Secondary | ICD-10-CM | POA: Diagnosis not present

## 2017-12-20 DIAGNOSIS — R3129 Other microscopic hematuria: Secondary | ICD-10-CM | POA: Diagnosis not present

## 2017-12-20 DIAGNOSIS — N23 Unspecified renal colic: Secondary | ICD-10-CM | POA: Diagnosis not present

## 2017-12-20 DIAGNOSIS — N3020 Other chronic cystitis without hematuria: Secondary | ICD-10-CM | POA: Diagnosis not present

## 2017-12-27 DIAGNOSIS — N3020 Other chronic cystitis without hematuria: Secondary | ICD-10-CM | POA: Diagnosis not present

## 2017-12-27 DIAGNOSIS — N200 Calculus of kidney: Secondary | ICD-10-CM | POA: Diagnosis not present

## 2017-12-27 DIAGNOSIS — R3129 Other microscopic hematuria: Secondary | ICD-10-CM | POA: Diagnosis not present

## 2018-01-08 DIAGNOSIS — D485 Neoplasm of uncertain behavior of skin: Secondary | ICD-10-CM | POA: Diagnosis not present

## 2018-01-08 DIAGNOSIS — L578 Other skin changes due to chronic exposure to nonionizing radiation: Secondary | ICD-10-CM | POA: Diagnosis not present

## 2018-01-08 DIAGNOSIS — Z86018 Personal history of other benign neoplasm: Secondary | ICD-10-CM | POA: Diagnosis not present

## 2018-02-06 DIAGNOSIS — R3129 Other microscopic hematuria: Secondary | ICD-10-CM | POA: Diagnosis not present

## 2018-02-06 DIAGNOSIS — N3091 Cystitis, unspecified with hematuria: Secondary | ICD-10-CM | POA: Diagnosis not present

## 2018-02-06 DIAGNOSIS — N3020 Other chronic cystitis without hematuria: Secondary | ICD-10-CM | POA: Diagnosis not present

## 2018-02-06 DIAGNOSIS — N200 Calculus of kidney: Secondary | ICD-10-CM | POA: Diagnosis not present

## 2018-03-19 ENCOUNTER — Encounter: Payer: Self-pay | Admitting: Nurse Practitioner

## 2018-03-19 ENCOUNTER — Ambulatory Visit (INDEPENDENT_AMBULATORY_CARE_PROVIDER_SITE_OTHER): Payer: Medicare Other | Admitting: Nurse Practitioner

## 2018-03-19 VITALS — BP 124/66 | HR 75 | Resp 16 | Ht 66.0 in | Wt 187.0 lb

## 2018-03-19 DIAGNOSIS — J449 Chronic obstructive pulmonary disease, unspecified: Secondary | ICD-10-CM | POA: Diagnosis not present

## 2018-03-19 DIAGNOSIS — R300 Dysuria: Secondary | ICD-10-CM

## 2018-03-19 DIAGNOSIS — M150 Primary generalized (osteo)arthritis: Secondary | ICD-10-CM

## 2018-03-19 DIAGNOSIS — E1165 Type 2 diabetes mellitus with hyperglycemia: Secondary | ICD-10-CM

## 2018-03-19 DIAGNOSIS — Z0001 Encounter for general adult medical examination with abnormal findings: Secondary | ICD-10-CM

## 2018-03-19 LAB — POCT URINALYSIS DIPSTICK
Bilirubin, UA: NEGATIVE
Glucose, UA: NEGATIVE
KETONES UA: NEGATIVE
Leukocytes, UA: NEGATIVE
NITRITE UA: NEGATIVE
PROTEIN UA: NEGATIVE
RBC UA: NEGATIVE
SPEC GRAV UA: 1.01 (ref 1.010–1.025)
UROBILINOGEN UA: 0.2 U/dL
pH, UA: 5 (ref 5.0–8.0)

## 2018-03-19 LAB — POCT GLYCOSYLATED HEMOGLOBIN (HGB A1C): Hemoglobin A1C: 5.8 % — AB (ref 4.0–5.6)

## 2018-03-19 MED ORDER — ALBUTEROL SULFATE HFA 108 (90 BASE) MCG/ACT IN AERS: 2.0000 | INHALATION_SPRAY | Freq: Four times a day (QID) | RESPIRATORY_TRACT | 5 refills | Status: DC | PRN

## 2018-03-19 MED ORDER — TIOTROPIUM BROMIDE MONOHYDRATE 18 MCG IN CAPS: 18.0000 ug | ORAL_CAPSULE | Freq: Every day | RESPIRATORY_TRACT | 5 refills | Status: DC

## 2018-03-19 NOTE — Progress Notes (Signed)
[facility] [place], [place] [address]  Internal MEDICINE  Office Visit Note  Patient Name: Amber Holder  [id number]  [id number]  Date of Service: 04/07/2018   Pt is here for routine health maintenance examination  Chief Complaint  Patient presents with  . Annual Exam  . Diabetes     Diabetes  She presents for her follow-up diabetic visit. She has type 2 diabetes mellitus. No MedicAlert identification noted. Her disease course has been stable. There are no hypoglycemic associated symptoms. Pertinent negatives for hypoglycemia include no dizziness, headaches or nervousness/anxiousness. There are no diabetic associated symptoms. Pertinent negatives for diabetes include no chest pain and no fatigue. Hypoglycemia complications include nocturnal hypoglycemia. Symptoms are stable. Diabetic complications include nephropathy. Risk factors for coronary artery disease include diabetes mellitus, dyslipidemia, hypertension, stress and post-menopausal. Current diabetic treatment includes oral agent (monotherapy). She is compliant with treatment all of the time. Her weight is stable. She is following a generally healthy diet. Meal planning includes avoidance of concentrated sweets. She has not had a previous visit with a dietitian. She participates in exercise intermittently. There is no change in her home blood glucose trend. An ACE inhibitor/angiotensin II receptor blocker is not being taken. She does not see a podiatrist.Eye exam is current.     Current Medication: Outpatient Encounter Medications as of 03/19/2018  Medication Sig Note  . acetaminophen (TYLENOL) 500 MG chewable tablet Chew 1,000 mg by mouth every 8 (eight) hours as needed for pain.   Marland Kitchen albuterol (PROVENTIL HFA;VENTOLIN HFA) 108 (90 Base) MCG/ACT inhaler Inhale 2 puffs into the lungs every 6 (six) hours as needed for wheezing or shortness of breath.   . Carboxymethylcell-Hypromellose (GENTEAL) 0.25-0.3 %  GEL Apply 1 drop to eye as needed.   . cholecalciferol (VITAMIN D) 400 UNITS TABS tablet Take 2,000 Units by mouth daily.    Marland Kitchen enoxaparin (LOVENOX) 40 MG/0.4ML injection Inject 0.4 mLs (40 mg total) into the skin daily.   . furosemide (LASIX) 20 MG tablet TAKE 1 TABLET BY MOUTH DAILY   . ibandronate (BONIVA) 150 MG tablet Take 1 tablet (150 mg total) by mouth every 30 (thirty) days.   . meloxicam (MOBIC) 7.5 MG tablet Take 1 tablet (7.5 mg total) by mouth as needed for pain.   . metFORMIN (GLUCOPHAGE) 500 MG tablet Take 250 mg by mouth daily with breakfast.    . montelukast (SINGULAIR) 10 MG tablet Take 10 mg by mouth as needed.    . nitrofurantoin, macrocrystal-monohydrate, (MACROBID) 100 MG capsule Take 1 capsule (100 mg total) by mouth 2 (two) times daily.   . potassium chloride (KLOR-CON) 8 MEQ tablet Take 8 mEq by mouth as needed (only take when taking Lasix).    . tapentadol (NUCYNTA) 50 MG tablet Take 1-2 tablets (50-100 mg total) by mouth every 4 (four) hours as needed for moderate pain.   Marland Kitchen tiotropium (SPIRIVA) 18 MCG inhalation capsule Place 1 capsule (18 mcg total) into inhaler and inhale daily.   . [DISCONTINUED] albuterol (PROVENTIL HFA;VENTOLIN HFA) 108 (90 BASE) MCG/ACT inhaler Inhale 2 puffs into the lungs every 6 (six) hours as needed for wheezing or shortness of breath.   . [DISCONTINUED] ciprofloxacin (CIPRO) 500 MG tablet Take 1 tablet (500 mg total) by mouth 2 (two) times daily.   . [DISCONTINUED] doxycycline (VIBRA-TABS) 100 MG tablet Take 1 tablet (100 mg total) by mouth 2 (two) times daily.   . [DISCONTINUED] tiotropium (SPIRIVA) 18 MCG inhalation capsule Place 18 mcg  into inhaler and inhale daily.   Marland Kitchen omega-3 acid ethyl esters (LOVAZA) 1 G capsule Take by mouth daily. 05/31/2016: Not taking   No facility-administered encounter medications on file as of 03/19/2018.     Surgical History: Past Surgical History:  Procedure Laterality Date  . APPENDECTOMY    . cataract  surgery Bilateral   . CHOLECYSTECTOMY    . COLONOSCOPY    . COLONOSCOPY WITH PROPOFOL N/A 05/28/2015   Procedure: COLONOSCOPY WITH PROPOFOL;  Surgeon: Lollie Sails, MD;  Location: [facility] ENDOSCOPY;  Service: Endoscopy;  Laterality: N/A;  . EYE SURGERY Bilateral 2015   Cataract Extraction with IOL  . LITHOTRIPSY  2015   has had many stones  . Mastoidotomy  1970  . ROTATOR CUFF REPAIR Right 2011  . TOTAL HIP ARTHROPLASTY Right 06/14/2016   Procedure: TOTAL HIP ARTHROPLASTY;  Surgeon: Dereck Leep, MD;  Location: [facility] ORS;  Service: Orthopedics;  Laterality: Right;    Medical History: Past Medical History:  Diagnosis Date  . Anxiety 01/02/2012  . Arthritis   . Asthma   . Chronic kidney disease    Kidney stones  . COPD (chronic obstructive pulmonary disease) ([place])   . Diabetes mellitus without complication ([place])   . GERD (gastroesophageal reflux disease)   . HOH (hard of hearing)    Bilateral hearing aids  . Osteoporosis   . Sleep apnea    Does not use C-PAP on a regular basis  . Venous stasis     Family History: Family History  Problem Relation Age of Onset  . Breast cancer Maternal Aunt 50      Review of Systems  Constitutional: Negative for activity change, chills, fatigue and unexpected weight change.  HENT: Negative for congestion, nosebleeds, postnasal drip, rhinorrhea and voice change.   Eyes: Negative.   Respiratory: Negative for cough, chest tightness, shortness of breath and wheezing.   Cardiovascular: Positive for leg swelling. Negative for chest pain and palpitations.  Gastrointestinal: Positive for nausea. Negative for constipation, diarrhea and vomiting.  Endocrine:       Blood sugars doing well   Genitourinary: Positive for frequency and urgency. Negative for difficulty urinating, dysuria and flank pain.  Musculoskeletal: Positive for back pain.  Skin: Negative for rash.  Allergic/Immunologic: Positive for environmental allergies.  Neurological:  Negative for dizziness and headaches.  Hematological: Negative for adenopathy.  Psychiatric/Behavioral: Negative for behavioral problems, dysphoric mood and sleep disturbance. The patient is not nervous/anxious.     Today's Vitals   03/19/18 1146  BP: 124/66  Pulse: 75  Resp: 16  SpO2: 94%  Weight: 187 lb (84.8 kg)  Height: 5\' 6"  (1.676 m)    Physical Exam  Constitutional: She is oriented to person, place, and time. She appears well-developed and well-nourished. No distress.  HENT:  Head: Normocephalic and atraumatic.  Nose: Nose normal.  Mouth/Throat: Oropharynx is clear and moist. No oropharyngeal exudate.  Eyes: Pupils are equal, round, and reactive to light. Conjunctivae and EOM are normal.  Neck: Normal range of motion. Neck supple. No JVD present. Carotid bruit is not present. No tracheal deviation present. No thyromegaly present.  Cardiovascular: Normal rate, normal heart sounds and intact distal pulses. Exam reveals no gallop and no friction rub.  No murmur heard. Pulses:      Dorsalis pedis pulses are 1+ on the right side, and 1+ on the left side.       Posterior tibial pulses are 1+ on the right side, and 1+ on the  left side.  Irregular heart rhythm   Pulmonary/Chest: Effort normal and breath sounds normal. No respiratory distress. She has no wheezes. She has no rales. She exhibits no tenderness. Right breast exhibits no inverted nipple, no mass, no nipple discharge, no skin change and no tenderness. Left breast exhibits no inverted nipple, no mass, no nipple discharge, no skin change and no tenderness.  Abdominal: Soft. Bowel sounds are normal. There is no tenderness.  Genitourinary:  Genitourinary Comments: U/a negative for evidence of infection or abnormalities today.  Musculoskeletal: Normal range of motion.       Right foot: There is normal range of motion and no deformity.       Left foot: There is normal range of motion and no deformity.  Feet:  Right Foot:   Protective Sensation: 10 sites tested. 10 sites sensed.  Skin Integrity: Positive for dry skin.  Left Foot:  Protective Sensation: 10 sites tested. 10 sites sensed.  Skin Integrity: Positive for dry skin.  Lymphadenopathy:    She has no cervical adenopathy.  Neurological: She is alert and oriented to person, place, and time. No cranial nerve deficit.  Skin: Skin is warm and dry. Capillary refill takes 2 to 3 seconds. She is not diaphoretic.  Psychiatric: She has a normal mood and affect. Her behavior is normal. Judgment and thought content normal.  Nursing note and vitals reviewed.    LABS: Recent Results (from the past 2160 hour(s))  UA/M w/rflx Culture, Routine     Status: None   Collection Time: 03/19/18 12:00 PM  Result Value Ref Range   Specific Gravity, UA 1.015 1.005 - 1.030   pH, UA 5.5 5.0 - 7.5   Color, UA Yellow Yellow   Appearance Ur Clear Clear   Leukocytes, UA Negative Negative   Protein, UA Negative Negative/Trace   Glucose, UA Negative Negative   Ketones, UA Negative Negative   RBC, UA Negative Negative   Bilirubin, UA Negative Negative   Urobilinogen, Ur 0.2 0.2 - 1.0 mg/dL   Nitrite, UA Negative Negative   Microscopic Examination Comment     Comment: Microscopic follows if indicated.   Microscopic Examination See below:     Comment: Microscopic was indicated and was performed.   Urinalysis Reflex Comment     Comment: This specimen will not reflex to a Urine Culture.  Microalbumin, urine     Status: None   Collection Time: 03/19/18 12:00 PM  Result Value Ref Range   Microalbumin, Urine 3.7 Not Estab. ug/mL  Microscopic Examination     Status: None   Collection Time: 03/19/18 12:00 PM  Result Value Ref Range   WBC, UA 0-5 0 - 5 /hpf   RBC, UA 0-2 0 - 2 /hpf   Epithelial Cells (non renal) None seen 0 - 10 /hpf   Casts None seen None seen /lpf   Mucus, UA Present Not Estab.   Bacteria, UA None seen None seen/Few  POCT HgB A1C     Status: Abnormal    Collection Time: 03/19/18 12:06 PM  Result Value Ref Range   Hemoglobin A1C 5.8 (A) 4.0 - 5.6 %   HbA1c POC (<> result, manual entry)  4.0 - 5.6 %   HbA1c, POC (prediabetic range)  5.7 - 6.4 %   HbA1c, POC (controlled diabetic range)  0.0 - 7.0 %  POCT Urinalysis Dipstick     Status: None   Collection Time: 03/19/18  2:47 PM  Result Value Ref Range   Color,  UA     Clarity, UA     Glucose, UA Negative Negative   Bilirubin, UA negative    Ketones, UA negative    Spec Grav, UA 1.010 1.010 - 1.025   Blood, UA negative    pH, UA 5.0 5.0 - 8.0   Protein, UA Negative Negative   Urobilinogen, UA 0.2 0.2 or 1.0 E.U./dL   Nitrite, UA negative    Leukocytes, UA Negative Negative   Appearance     Odor     Assessment/Plan:  1. Encounter for general adult medical examination with abnormal findings Annual health maintenance exam today.  2. Uncontrolled type 2 diabetes mellitus with hyperglycemia (HCC) - POCT HgB A1C 5.8 today. Continue diabetic medication as prescribed.  - Microalbumin, urine  3. Chronic obstructive pulmonary disease, unspecified COPD type ([place]) - tiotropium (SPIRIVA) 18 MCG inhalation capsule; Place 1 capsule (18 mcg total) into inhaler and inhale daily.  Dispense: 30 capsule; Refill: 5 - albuterol (PROVENTIL HFA;VENTOLIN HFA) 108 (90 Base) MCG/ACT inhaler; Inhale 2 puffs into the lungs every 6 (six) hours as needed for wheezing or shortness of breath.  Dispense: 1 Inhaler; Refill: 5  4. Primary generalized (osteo)arthritis May continue to take meloxicam 7.5mg  daily when needed.   5. Dysuria U/a negative for evidence of infection or other abnormalities. Will continue to monitor  - UA/M w/rflx Culture, Routine - POCT Urinalysis Dipstick  General Counseling: Cynethia verbalizes understanding of the findings of todays visit and agrees with plan of treatment. I have discussed any further diagnostic evaluation that may be needed or ordered today. We also reviewed her  medications today. she has been encouraged to call the office with any questions or concerns that should arise related to todays visit.    Counseling:  Diabetes Counseling:  1. Addition of ACE inh/ ARB'S for nephroprotection. Microalbumin is updated  2. Diabetic foot care, prevention of complications. Podiatry consult 3. Exercise and lose weight.  4. Diabetic eye examination, Diabetic eye exam is updated  5. Monitor blood sugar closlely. nutrition counseling.  6. Sign and symptoms of hypoglycemia including shaking sweating,confusion and headaches.   This patient was seen by Leretha Pol FNP Collaboration with Dr Lavera Guise as a part of collaborative care agreement   Orders Placed This Encounter  Procedures  . Microscopic Examination  . UA/M w/rflx Culture, Routine  . Microalbumin, urine  . POCT HgB A1C  . POCT Urinalysis Dipstick    Meds ordered this encounter  Medications  . tiotropium (SPIRIVA) 18 MCG inhalation capsule    Sig: Place 1 capsule (18 mcg total) into inhaler and inhale daily.    Dispense:  30 capsule    Refill:  5    Order Specific Question:   Supervising Provider    Answer:   Lavera Guise [[id number]]  . albuterol (PROVENTIL HFA;VENTOLIN HFA) 108 (90 Base) MCG/ACT inhaler    Sig: Inhale 2 puffs into the lungs every 6 (six) hours as needed for wheezing or shortness of breath.    Dispense:  1 Inhaler    Refill:  5    Order Specific Question:   Supervising Provider    Answer:   Lavera Guise [[id number]]    Time spent: [place], MD  Internal Medicine

## 2018-03-20 LAB — UA/M W/RFLX CULTURE, ROUTINE
Bilirubin, UA: NEGATIVE
Glucose, UA: NEGATIVE
Ketones, UA: NEGATIVE
Leukocytes, UA: NEGATIVE
NITRITE UA: NEGATIVE
PH UA: 5.5 (ref 5.0–7.5)
Protein, UA: NEGATIVE
RBC UA: NEGATIVE
Specific Gravity, UA: 1.015 (ref 1.005–1.030)
Urobilinogen, Ur: 0.2 mg/dL (ref 0.2–1.0)

## 2018-03-20 LAB — MICROSCOPIC EXAMINATION
Bacteria, UA: NONE SEEN
Casts: NONE SEEN /lpf
EPITHELIAL CELLS (NON RENAL): NONE SEEN /HPF (ref 0–10)

## 2018-03-20 LAB — MICROALBUMIN, URINE: MICROALBUM., U, RANDOM: 3.7 ug/mL

## 2018-03-28 ENCOUNTER — Other Ambulatory Visit: Payer: Self-pay

## 2018-03-28 DIAGNOSIS — E1165 Type 2 diabetes mellitus with hyperglycemia: Secondary | ICD-10-CM

## 2018-03-28 MED ORDER — GLUCOSE BLOOD VI STRP: ORAL_STRIP | 5 refills | Status: DC

## 2018-03-29 ENCOUNTER — Other Ambulatory Visit: Payer: Self-pay

## 2018-03-29 DIAGNOSIS — E1165 Type 2 diabetes mellitus with hyperglycemia: Secondary | ICD-10-CM

## 2018-03-29 MED ORDER — GLUCOSE BLOOD VI STRP: ORAL_STRIP | 5 refills | Status: DC

## 2018-04-07 DIAGNOSIS — M150 Primary generalized (osteo)arthritis: Secondary | ICD-10-CM | POA: Insufficient documentation

## 2018-04-07 DIAGNOSIS — Z0001 Encounter for general adult medical examination with abnormal findings: Secondary | ICD-10-CM | POA: Insufficient documentation

## 2018-05-13 DIAGNOSIS — N3946 Mixed incontinence: Secondary | ICD-10-CM | POA: Diagnosis not present

## 2018-05-13 DIAGNOSIS — N200 Calculus of kidney: Secondary | ICD-10-CM | POA: Diagnosis not present

## 2018-05-13 DIAGNOSIS — Z6830 Body mass index (BMI) 30.0-30.9, adult: Secondary | ICD-10-CM | POA: Diagnosis not present

## 2018-05-28 ENCOUNTER — Encounter: Payer: Self-pay | Admitting: Internal Medicine

## 2018-05-28 NOTE — Progress Notes (Signed)
SCANNED IN HOME SLEEP TEST DONE ON 09/21/17

## 2018-06-13 DIAGNOSIS — Z96641 Presence of right artificial hip joint: Secondary | ICD-10-CM | POA: Diagnosis not present

## 2018-06-13 DIAGNOSIS — M1611 Unilateral primary osteoarthritis, right hip: Secondary | ICD-10-CM | POA: Diagnosis not present

## 2018-06-22 ENCOUNTER — Other Ambulatory Visit: Payer: Self-pay | Admitting: Internal Medicine

## 2018-07-09 ENCOUNTER — Other Ambulatory Visit: Payer: Self-pay | Admitting: Nurse Practitioner

## 2018-07-09 DIAGNOSIS — E1165 Type 2 diabetes mellitus with hyperglycemia: Secondary | ICD-10-CM | POA: Diagnosis not present

## 2018-07-09 DIAGNOSIS — I10 Essential (primary) hypertension: Secondary | ICD-10-CM | POA: Diagnosis not present

## 2018-07-09 DIAGNOSIS — E559 Vitamin D deficiency, unspecified: Secondary | ICD-10-CM | POA: Diagnosis not present

## 2018-07-09 DIAGNOSIS — Z0001 Encounter for general adult medical examination with abnormal findings: Secondary | ICD-10-CM | POA: Diagnosis not present

## 2018-07-09 DIAGNOSIS — M150 Primary generalized (osteo)arthritis: Secondary | ICD-10-CM | POA: Diagnosis not present

## 2018-07-10 DIAGNOSIS — H7200 Central perforation of tympanic membrane, unspecified ear: Secondary | ICD-10-CM | POA: Diagnosis not present

## 2018-07-10 DIAGNOSIS — H6530 Chronic mucoid otitis media, unspecified ear: Secondary | ICD-10-CM | POA: Diagnosis not present

## 2018-07-10 DIAGNOSIS — H903 Sensorineural hearing loss, bilateral: Secondary | ICD-10-CM | POA: Diagnosis not present

## 2018-07-10 DIAGNOSIS — H6123 Impacted cerumen, bilateral: Secondary | ICD-10-CM | POA: Diagnosis not present

## 2018-07-10 LAB — CBC
HEMATOCRIT: 43.2 % (ref 34.0–46.6)
HEMOGLOBIN: 14.4 g/dL (ref 11.1–15.9)
MCH: 29 pg (ref 26.6–33.0)
MCHC: 33.3 g/dL (ref 31.5–35.7)
MCV: 87 fL (ref 79–97)
Platelets: 244 10*3/uL (ref 150–450)
RBC: 4.96 x10E6/uL (ref 3.77–5.28)
RDW: 12.8 % (ref 12.3–15.4)
WBC: 6.9 10*3/uL (ref 3.4–10.8)

## 2018-07-10 LAB — COMPREHENSIVE METABOLIC PANEL
ALT: 15 IU/L (ref 0–32)
AST: 17 IU/L (ref 0–40)
Albumin/Globulin Ratio: 1.7 (ref 1.2–2.2)
Albumin: 4.2 g/dL (ref 3.5–4.8)
Alkaline Phosphatase: 63 IU/L (ref 39–117)
BILIRUBIN TOTAL: 0.5 mg/dL (ref 0.0–1.2)
BUN/Creatinine Ratio: 25 (ref 12–28)
BUN: 16 mg/dL (ref 8–27)
CHLORIDE: 105 mmol/L (ref 96–106)
CO2: 23 mmol/L (ref 20–29)
Calcium: 9.6 mg/dL (ref 8.7–10.3)
Creatinine, Ser: 0.64 mg/dL (ref 0.57–1.00)
GFR calc non Af Amer: 89 mL/min/{1.73_m2} (ref >59)
GFR, EST AFRICAN AMERICAN: 102 mL/min/{1.73_m2} (ref >59)
GLUCOSE: 106 mg/dL — AB (ref 65–99)
Globulin, Total: 2.5 g/dL (ref 1.5–4.5)
Potassium: 4.6 mmol/L (ref 3.5–5.2)
Sodium: 142 mmol/L (ref 134–144)
TOTAL PROTEIN: 6.7 g/dL (ref 6.0–8.5)

## 2018-07-10 LAB — LIPID PANEL W/O CHOL/HDL RATIO
CHOLESTEROL TOTAL: 250 mg/dL — AB (ref 100–199)
HDL: 53 mg/dL (ref >39)
LDL Calculated: 175 mg/dL — ABNORMAL HIGH (ref 0–99)
TRIGLYCERIDES: 111 mg/dL (ref 0–149)
VLDL Cholesterol Cal: 22 mg/dL (ref 5–40)

## 2018-07-10 LAB — T4, FREE: Free T4: 1.16 ng/dL (ref 0.82–1.77)

## 2018-07-10 LAB — SEDIMENTATION RATE: SED RATE: 19 mm/h (ref 0–40)

## 2018-07-10 LAB — VITAMIN D 25 HYDROXY (VIT D DEFICIENCY, FRACTURES): VIT D 25 HYDROXY: 18.6 ng/mL — AB (ref 30.0–100.0)

## 2018-07-10 LAB — RHEUMATOID FACTOR

## 2018-07-10 LAB — ANA W/REFLEX IF POSITIVE: Anti Nuclear Antibody(ANA): NEGATIVE

## 2018-07-10 LAB — TSH: TSH: 1.72 u[IU]/mL (ref 0.450–4.500)

## 2018-07-15 DIAGNOSIS — L821 Other seborrheic keratosis: Secondary | ICD-10-CM | POA: Diagnosis not present

## 2018-07-15 DIAGNOSIS — Z872 Personal history of diseases of the skin and subcutaneous tissue: Secondary | ICD-10-CM | POA: Diagnosis not present

## 2018-07-15 DIAGNOSIS — L738 Other specified follicular disorders: Secondary | ICD-10-CM | POA: Diagnosis not present

## 2018-07-15 DIAGNOSIS — Z1283 Encounter for screening for malignant neoplasm of skin: Secondary | ICD-10-CM | POA: Diagnosis not present

## 2018-07-15 DIAGNOSIS — Z86018 Personal history of other benign neoplasm: Secondary | ICD-10-CM | POA: Diagnosis not present

## 2018-07-15 DIAGNOSIS — L578 Other skin changes due to chronic exposure to nonionizing radiation: Secondary | ICD-10-CM | POA: Diagnosis not present

## 2018-07-23 ENCOUNTER — Encounter: Payer: Self-pay | Admitting: Nurse Practitioner

## 2018-07-23 ENCOUNTER — Ambulatory Visit (INDEPENDENT_AMBULATORY_CARE_PROVIDER_SITE_OTHER): Payer: Medicare Other | Admitting: Nurse Practitioner

## 2018-07-23 VITALS — BP 135/79 | HR 72 | Resp 16 | Ht 66.0 in | Wt 191.0 lb

## 2018-07-23 DIAGNOSIS — R300 Dysuria: Secondary | ICD-10-CM

## 2018-07-23 DIAGNOSIS — M810 Age-related osteoporosis without current pathological fracture: Secondary | ICD-10-CM | POA: Diagnosis not present

## 2018-07-23 DIAGNOSIS — N390 Urinary tract infection, site not specified: Secondary | ICD-10-CM | POA: Diagnosis not present

## 2018-07-23 DIAGNOSIS — E119 Type 2 diabetes mellitus without complications: Secondary | ICD-10-CM

## 2018-07-23 DIAGNOSIS — Q615 Medullary cystic kidney: Secondary | ICD-10-CM | POA: Diagnosis not present

## 2018-07-23 DIAGNOSIS — Z23 Encounter for immunization: Secondary | ICD-10-CM | POA: Diagnosis not present

## 2018-07-23 DIAGNOSIS — E1165 Type 2 diabetes mellitus with hyperglycemia: Secondary | ICD-10-CM

## 2018-07-23 DIAGNOSIS — E559 Vitamin D deficiency, unspecified: Secondary | ICD-10-CM

## 2018-07-23 LAB — POCT URINALYSIS DIPSTICK
Bilirubin, UA: NEGATIVE
Glucose, UA: NEGATIVE
Ketones, UA: NEGATIVE
NITRITE UA: NEGATIVE
PROTEIN UA: NEGATIVE
Spec Grav, UA: <=1.005 — AB (ref 1.010–1.025)
Urobilinogen, UA: 0.2 E.U./dL
pH, UA: 7 (ref 5.0–8.0)

## 2018-07-23 LAB — POCT GLYCOSYLATED HEMOGLOBIN (HGB A1C): HEMOGLOBIN A1C: 5.8 % — AB (ref 4.0–5.6)

## 2018-07-23 MED ORDER — IBANDRONATE SODIUM 150 MG PO TABS: 150.0000 mg | ORAL_TABLET | ORAL | 0 refills | Status: DC

## 2018-07-23 MED ORDER — GLUCOSE BLOOD VI STRP: ORAL_STRIP | 12 refills | Status: DC

## 2018-07-23 MED ORDER — PNEUMOCOCCAL VAC POLYVALENT 25 MCG/0.5ML IJ INJ: INJECTION | INTRAMUSCULAR | 0 refills | Status: DC

## 2018-07-23 MED ORDER — NITROFURANTOIN MONOHYD MACRO 100 MG PO CAPS: ORAL_CAPSULE | ORAL | 0 refills | Status: DC

## 2018-07-23 MED ORDER — ERGOCALCIFEROL 1.25 MG (50000 UT) PO CAPS: 50000.0000 [IU] | ORAL_CAPSULE | ORAL | 5 refills | Status: DC

## 2018-07-23 NOTE — Progress Notes (Signed)
[facility] [place], [place] [address]  Internal MEDICINE  Office Visit Note  Patient Name: Amber Holder  [id number]  [id number]  Date of Service: 07/27/2018  Chief Complaint  Patient presents with  . Medical Management of Chronic Issues    4 month follow up  . Diabetes  . Urinary Tract Infection    pt recently had protein in urine and want to make sure the antibiotic cleared up the UTI    The patient states that she is no longer taking antibiotics. Was told per her urologist that she did not require them any longer. This is new urologist, as her previous urologist retired. The patient has noted increased frequency and urgency of urination since stopping antibiotics. States that she has noted some lower back pain as well. Denies abdominal pain, nausea, or vomiting. She denies fever or chills.  Blood sugars are doing well. She continues to take metformin 250mg  dally with breakfast .      Current Medication: Outpatient Encounter Medications as of 07/23/2018  Medication Sig Note  . acetaminophen (TYLENOL) 500 MG chewable tablet Chew 1,000 mg by mouth every 8 (eight) hours as needed for pain.   Marland Kitchen albuterol (PROVENTIL HFA;VENTOLIN HFA) 108 (90 Base) MCG/ACT inhaler Inhale 2 puffs into the lungs every 6 (six) hours as needed for wheezing or shortness of breath.   . Carboxymethylcell-Hypromellose (GENTEAL) 0.25-0.3 % GEL Apply 1 drop to eye as needed.   . cholecalciferol (VITAMIN D) 400 UNITS TABS tablet Take 2,000 Units by mouth daily.    Marland Kitchen enoxaparin (LOVENOX) 40 MG/0.4ML injection Inject 0.4 mLs (40 mg total) into the skin daily.   . furosemide (LASIX) 20 MG tablet TAKE 1 TABLET BY MOUTH DAILY   . ibandronate (BONIVA) 150 MG tablet Take 1 tablet (150 mg total) by mouth every 30 (thirty) days.   . meloxicam (MOBIC) 7.5 MG tablet Take 1 tablet (7.5 mg total) by mouth as needed for pain.   . metFORMIN (GLUCOPHAGE) 500 MG tablet Take 250 mg by mouth daily with  breakfast.    . montelukast (SINGULAIR) 10 MG tablet TAKE 1 TABLET BY MOUTH DAILY FOR COUGH   . potassium chloride (KLOR-CON) 8 MEQ tablet Take 8 mEq by mouth as needed (only take when taking Lasix).    . tapentadol (NUCYNTA) 50 MG tablet Take 1-2 tablets (50-100 mg total) by mouth every 4 (four) hours as needed for moderate pain.   Marland Kitchen tiotropium (SPIRIVA) 18 MCG inhalation capsule Place 1 capsule (18 mcg total) into inhaler and inhale daily.   . [DISCONTINUED] glucose blood test strip Use once daily.   . [DISCONTINUED] ibandronate (BONIVA) 150 MG tablet Take 1 tablet (150 mg total) by mouth every 30 (thirty) days.   . ergocalciferol (DRISDOL) 1.25 MG (50000 UT) capsule Take 1 capsule (50,000 Units total) by mouth once a week.   Marland Kitchen glucose blood test strip Blood sugar testing done BID dx E11.65   . nitrofurantoin, macrocrystal-monohydrate, (MACROBID) 100 MG capsule Take 1 capsule po bid for 7 days then take 1 capsule po qd to prevent infection.   Marland Kitchen omega-3 acid ethyl esters (LOVAZA) 1 G capsule Take by mouth daily. 05/31/2016: Not taking  . pneumococcal 23 valent vaccine (PNEUMOVAX 23) 25 MCG/0.5ML injection Inject 0.62ml IM once   . [DISCONTINUED] nitrofurantoin, macrocrystal-monohydrate, (MACROBID) 100 MG capsule Take 1 capsule (100 mg total) by mouth 2 (two) times daily. (Patient not taking: Reported on 07/23/2018)    No facility-administered encounter medications  on file as of 07/23/2018.     Surgical History: Past Surgical History:  Procedure Laterality Date  . APPENDECTOMY    . cataract surgery Bilateral   . CHOLECYSTECTOMY    . COLONOSCOPY    . COLONOSCOPY WITH PROPOFOL N/A 05/28/2015   Procedure: COLONOSCOPY WITH PROPOFOL;  Surgeon: Lollie Sails, MD;  Location: [facility] ENDOSCOPY;  Service: Endoscopy;  Laterality: N/A;  . EYE SURGERY Bilateral 2015   Cataract Extraction with IOL  . LITHOTRIPSY  2015   has had many stones  . Mastoidotomy  1970  . ROTATOR CUFF REPAIR Right 2011  .  TOTAL HIP ARTHROPLASTY Right 06/14/2016   Procedure: TOTAL HIP ARTHROPLASTY;  Surgeon: Dereck Leep, MD;  Location: [facility] ORS;  Service: Orthopedics;  Laterality: Right;    Medical History: Past Medical History:  Diagnosis Date  . Anxiety 01/02/2012  . Arthritis   . Asthma   . Chronic kidney disease    Kidney stones  . COPD (chronic obstructive pulmonary disease) ([place])   . Diabetes mellitus without complication ([place])   . GERD (gastroesophageal reflux disease)   . HOH (hard of hearing)    Bilateral hearing aids  . Osteoporosis   . Sleep apnea    Does not use C-PAP on a regular basis  . Venous stasis     Family History: Family History  Problem Relation Age of Onset  . Breast cancer Maternal Aunt 61    Social History   Socioeconomic History  . Marital status: Divorced    Spouse name: Not on file  . Number of children: Not on file  . Years of education: Not on file  . Highest education level: Not on file  Occupational History  . Not on file  Social Needs  . Financial resource strain: Not on file  . Food insecurity:    Worry: Not on file    Inability: Not on file  . Transportation needs:    Medical: Not on file    Non-medical: Not on file  Tobacco Use  . Smoking status: Current Every Day Smoker    Packs/day: 1.00    Types: Cigarettes  . Smokeless tobacco: Never Used  Substance and Sexual Activity  . Alcohol use: Yes    Comment: occassional  . Drug use: No  . Sexual activity: Never  Lifestyle  . Physical activity:    Days per week: Not on file    Minutes per session: Not on file  . Stress: Not on file  Relationships  . Social connections:    Talks on phone: Not on file    Gets together: Not on file    Attends religious service: Not on file    Active member of club or organization: Not on file    Attends meetings of clubs or organizations: Not on file    Relationship status: Not on file  . Intimate partner violence:    Fear of current or ex partner: Not  on file    Emotionally abused: Not on file    Physically abused: Not on file    Forced sexual activity: Not on file  Other Topics Concern  . Not on file  Social History Narrative  . Not on file      Review of Systems  Constitutional: Negative for activity change, chills, fatigue and unexpected weight change.  HENT: Negative for congestion, nosebleeds, postnasal drip, rhinorrhea and voice change.   Eyes: Negative.   Respiratory: Negative for cough, chest tightness, shortness of  breath and wheezing.   Cardiovascular: Negative for chest pain, palpitations and leg swelling.  Gastrointestinal: Negative for constipation, diarrhea, nausea and vomiting.  Endocrine: Negative for cold intolerance, heat intolerance, polydipsia and polyuria.       Blood sugars doing well   Genitourinary: Positive for dysuria, flank pain, frequency and urgency. Negative for difficulty urinating.  Musculoskeletal: Positive for back pain.  Skin: Negative for rash.  Allergic/Immunologic: Positive for environmental allergies.  Neurological: Negative for dizziness and headaches.  Hematological: Negative for adenopathy.  Psychiatric/Behavioral: Negative for behavioral problems, dysphoric mood and sleep disturbance. The patient is not nervous/anxious.    Today's Vitals   07/23/18 1132  BP: 135/79  Pulse: 72  Resp: 16  SpO2: 95%  Weight: 191 lb (86.6 kg)  Height: 5\' 6"  (1.676 m)    Physical Exam  Constitutional: She is oriented to person, place, and time. She appears well-developed and well-nourished. No distress.  HENT:  Head: Normocephalic and atraumatic.  Nose: Nose normal.  Mouth/Throat: No oropharyngeal exudate.  Eyes: Pupils are equal, round, and reactive to light. EOM are normal.  Neck: Normal range of motion. Neck supple. No JVD present. No tracheal deviation present. No thyromegaly present.  Cardiovascular: Normal rate, regular rhythm and normal heart sounds. Exam reveals no gallop and no friction  rub.  No murmur heard. Pulmonary/Chest: Effort normal and breath sounds normal. No respiratory distress. She has no wheezes. She has no rales. She exhibits no tenderness.  Abdominal: Soft. Bowel sounds are normal. There is no tenderness.  Genitourinary:  Genitourinary Comments: U/a positive for small wbc  Musculoskeletal: Normal range of motion.  Lymphadenopathy:    She has no cervical adenopathy.  Neurological: She is alert and oriented to person, place, and time. No cranial nerve deficit.  Skin: Skin is warm and dry. She is not diaphoretic.  Psychiatric: She has a normal mood and affect. Her behavior is normal. Judgment and thought content normal.  Nursing note and vitals reviewed.  Assessment/Plan:  1. Urinary tract infection without hematuria, site unspecified Start macrobid 100mg  twice daily for 7 days then daily to prevent further infection. Send urine sample for culture and sensitivity and adjust/add antibiotic treatment as indicated.  - nitrofurantoin, macrocrystal-monohydrate, (MACROBID) 100 MG capsule; Take 1 capsule po bid for 7 days then take 1 capsule po qd to prevent infection.  Dispense: 20 capsule; Refill: 0  2. Chronic urinary tract infection Refer to new urology provider for continued evaluation and treatment.  - Ambulatory referral to Urology  3. Medullary sponge kidney Refer to new urology provider for continued evaluation and treatment.  - Ambulatory referral to Urology  4. Type 2 diabetes mellitus without complication, without long-term current use of insulin (HCC) - POCT HgB A1C 5.8 today. Continue metformin as prescribed.  - glucose blood test strip; Blood sugar testing done BID dx E11.65  Dispense: 100 each; Refill: 12  5. Age-related osteoporosis without current pathological fracture Continue boniva 150mg  monthly.  - ibandronate (BONIVA) 150 MG tablet; Take 1 tablet (150 mg total) by mouth every 30 (thirty) days.  Dispense: 12 tablet; Refill: 0  6. Vitamin  D deficiency Drisdol 50000iu weekly for next 6 months.  - ergocalciferol (DRISDOL) 1.25 MG (50000 UT) capsule; Take 1 capsule (50,000 Units total) by mouth once a week.  Dispense: 4 capsule; Refill: 5  7. Need for vaccination against Streptococcus pneumoniae using pneumococcal conjugate vaccine 7 Prescription for pneumovax sent to pharmacy for administration.  - pneumococcal 23 valent vaccine (PNEUMOVAX 23)  25 MCG/0.5ML injection; Inject 0.36ml IM once  Dispense: 0.5 mL; Refill: 0  8. Dysuria - POCT Urinalysis Dipstick - CULTURE, URINE COMPREHENSIVE  General Counseling: Amber Holder verbalizes understanding of the findings of todays visit and agrees with plan of treatment. I have discussed any further diagnostic evaluation that may be needed or ordered today. We also reviewed her medications today. she has been encouraged to call the office with any questions or concerns that should arise related to todays visit.  Diabetes Counseling:  1. Addition of ACE inh/ ARB'S for nephroprotection. Microalbumin is updated  2. Diabetic foot care, prevention of complications. Podiatry consult 3. Exercise and lose weight.  4. Diabetic eye examination, Diabetic eye exam is updated  5. Monitor blood sugar closlely. nutrition counseling.  6. Sign and symptoms of hypoglycemia including shaking sweating,confusion and headaches.   This patient was seen by Leretha Pol FNP Collaboration with Dr Lavera Guise as a part of collaborative care agreement  Orders Placed This Encounter  Procedures  . CULTURE, URINE COMPREHENSIVE  . Ambulatory referral to Urology  . POCT Urinalysis Dipstick  . POCT HgB A1C    Meds ordered this encounter  Medications  . pneumococcal 23 valent vaccine (PNEUMOVAX 23) 25 MCG/0.5ML injection    Sig: Inject 0.33ml IM once    Dispense:  0.5 mL    Refill:  0    Order Specific Question:   Supervising Provider    Answer:   Lavera Guise [[id number]]  . ergocalciferol (DRISDOL) 1.25 MG (50000 UT)  capsule    Sig: Take 1 capsule (50,000 Units total) by mouth once a week.    Dispense:  4 capsule    Refill:  5    Order Specific Question:   Supervising Provider    Answer:   Lavera Guise [[id number]]  . ibandronate (BONIVA) 150 MG tablet    Sig: Take 1 tablet (150 mg total) by mouth every 30 (thirty) days.    Dispense:  12 tablet    Refill:  0    Order Specific Question:   Supervising Provider    Answer:   Lavera Guise [[id number]]  . nitrofurantoin, macrocrystal-monohydrate, (MACROBID) 100 MG capsule    Sig: Take 1 capsule po bid for 7 days then take 1 capsule po qd to prevent infection.    Dispense:  20 capsule    Refill:  0    Order Specific Question:   Supervising Provider    Answer:   Lavera Guise [[id number]]  . glucose blood test strip    Sig: Blood sugar testing done BID dx E11.65    Dispense:  100 each    Refill:  12    One touch verio strips    Order Specific Question:   Supervising Provider    Answer:   Lavera Guise [[id number]]    Time spent: 25 Minutes      Dr Lavera Guise Internal medicine

## 2018-07-27 DIAGNOSIS — Z23 Encounter for immunization: Secondary | ICD-10-CM | POA: Insufficient documentation

## 2018-07-27 DIAGNOSIS — E559 Vitamin D deficiency, unspecified: Secondary | ICD-10-CM | POA: Insufficient documentation

## 2018-07-27 LAB — CULTURE, URINE COMPREHENSIVE

## 2018-07-29 ENCOUNTER — Other Ambulatory Visit: Payer: Self-pay

## 2018-07-31 ENCOUNTER — Other Ambulatory Visit: Payer: Self-pay | Admitting: Adult Health

## 2018-07-31 ENCOUNTER — Other Ambulatory Visit: Payer: Self-pay

## 2018-07-31 DIAGNOSIS — E119 Type 2 diabetes mellitus without complications: Secondary | ICD-10-CM

## 2018-07-31 DIAGNOSIS — Z23 Encounter for immunization: Secondary | ICD-10-CM

## 2018-07-31 MED ORDER — PNEUMOCOCCAL VAC POLYVALENT 25 MCG/0.5ML IJ INJ: INJECTION | INTRAMUSCULAR | 0 refills | Status: DC

## 2018-07-31 MED ORDER — GLUCOSE BLOOD VI STRP: ORAL_STRIP | 3 refills | Status: DC

## 2018-08-06 ENCOUNTER — Ambulatory Visit (INDEPENDENT_AMBULATORY_CARE_PROVIDER_SITE_OTHER): Payer: Medicare Other | Admitting: Nurse Practitioner

## 2018-08-06 ENCOUNTER — Encounter: Payer: Self-pay | Admitting: Nurse Practitioner

## 2018-08-06 VITALS — BP 107/60 | HR 70 | Resp 16 | Ht 66.0 in | Wt 189.0 lb

## 2018-08-06 DIAGNOSIS — N390 Urinary tract infection, site not specified: Secondary | ICD-10-CM

## 2018-08-06 DIAGNOSIS — R300 Dysuria: Secondary | ICD-10-CM | POA: Diagnosis not present

## 2018-08-06 LAB — POCT URINALYSIS DIPSTICK
Bilirubin, UA: NEGATIVE
Glucose, UA: NEGATIVE
KETONES UA: NEGATIVE
Leukocytes, UA: NEGATIVE
Nitrite, UA: NEGATIVE
PH UA: 5 (ref 5.0–8.0)
PROTEIN UA: NEGATIVE
RBC UA: NEGATIVE
SPEC GRAV UA: 1.01 (ref 1.010–1.025)
Urobilinogen, UA: 0.2 E.U./dL

## 2018-08-06 NOTE — Progress Notes (Signed)
[facility] [place], [place] [address]  Internal MEDICINE  Office Visit Note  Patient Name: Amber Holder  [id number]  [id number]  Date of Service: 08/06/2018  Chief Complaint  Patient presents with  . Follow-up    uti    The patient is here for follow up of UTI. Was started on Macrobid twice daily for 7 days. Is now taking it daily to prevent further infection. Urine is clear of WBC, blood, or protein today. She feels better and has increased energy. She has appointment with new urologist 09/03/2018.       Current Medication: Outpatient Encounter Medications as of 08/06/2018  Medication Sig Note  . acetaminophen (TYLENOL) 500 MG chewable tablet Chew 1,000 mg by mouth every 8 (eight) hours as needed for pain.   Marland Kitchen albuterol (PROVENTIL HFA;VENTOLIN HFA) 108 (90 Base) MCG/ACT inhaler Inhale 2 puffs into the lungs every 6 (six) hours as needed for wheezing or shortness of breath.   . Carboxymethylcell-Hypromellose (GENTEAL) 0.25-0.3 % GEL Apply 1 drop to eye as needed.   . cholecalciferol (VITAMIN D) 400 UNITS TABS tablet Take 2,000 Units by mouth daily.    Marland Kitchen enoxaparin (LOVENOX) 40 MG/0.4ML injection Inject 0.4 mLs (40 mg total) into the skin daily.   . ergocalciferol (DRISDOL) 1.25 MG (50000 UT) capsule Take 1 capsule (50,000 Units total) by mouth once a week.   . furosemide (LASIX) 20 MG tablet TAKE 1 TABLET BY MOUTH DAILY   . glucose blood test strip Blood sugar testing once daily . dx E11.65   . ibandronate (BONIVA) 150 MG tablet Take 1 tablet (150 mg total) by mouth every 30 (thirty) days.   . meloxicam (MOBIC) 7.5 MG tablet Take 1 tablet (7.5 mg total) by mouth as needed for pain.   . metFORMIN (GLUCOPHAGE) 500 MG tablet Take 250 mg by mouth daily with breakfast.    . montelukast (SINGULAIR) 10 MG tablet TAKE 1 TABLET BY MOUTH DAILY FOR COUGH   . nitrofurantoin, macrocrystal-monohydrate, (MACROBID) 100 MG capsule Take 1 capsule po bid for 7 days then  take 1 capsule po qd to prevent infection.   Marland Kitchen omega-3 acid ethyl esters (LOVAZA) 1 G capsule Take by mouth daily. 05/31/2016: Not taking  . pneumococcal 23 valent vaccine (PNEUMOVAX 23) 25 MCG/0.5ML injection Inject 0.32ml IM once   . potassium chloride (KLOR-CON) 8 MEQ tablet Take 8 mEq by mouth as needed (only take when taking Lasix).    . tapentadol (NUCYNTA) 50 MG tablet Take 1-2 tablets (50-100 mg total) by mouth every 4 (four) hours as needed for moderate pain.   Marland Kitchen tiotropium (SPIRIVA) 18 MCG inhalation capsule Place 1 capsule (18 mcg total) into inhaler and inhale daily.    No facility-administered encounter medications on file as of 08/06/2018.     Surgical History: Past Surgical History:  Procedure Laterality Date  . APPENDECTOMY    . cataract surgery Bilateral   . CHOLECYSTECTOMY    . COLONOSCOPY    . COLONOSCOPY WITH PROPOFOL N/A 05/28/2015   Procedure: COLONOSCOPY WITH PROPOFOL;  Surgeon: Lollie Sails, MD;  Location: [facility] ENDOSCOPY;  Service: Endoscopy;  Laterality: N/A;  . EYE SURGERY Bilateral 2015   Cataract Extraction with IOL  . LITHOTRIPSY  2015   has had many stones  . Mastoidotomy  1970  . ROTATOR CUFF REPAIR Right 2011  . TOTAL HIP ARTHROPLASTY Right 06/14/2016   Procedure: TOTAL HIP ARTHROPLASTY;  Surgeon: Dereck Leep, MD;  Location: [facility] ORS;  Service: Orthopedics;  Laterality: Right;    Medical History: Past Medical History:  Diagnosis Date  . Anxiety 01/02/2012  . Arthritis   . Asthma   . Chronic kidney disease    Kidney stones  . COPD (chronic obstructive pulmonary disease) ([place])   . Diabetes mellitus without complication ([place])   . GERD (gastroesophageal reflux disease)   . HOH (hard of hearing)    Bilateral hearing aids  . Osteoporosis   . Sleep apnea    Does not use C-PAP on a regular basis  . Venous stasis     Family History: Family History  Problem Relation Age of Onset  . Breast cancer Maternal Aunt 25    Social History    Socioeconomic History  . Marital status: Divorced    Spouse name: Not on file  . Number of children: Not on file  . Years of education: Not on file  . Highest education level: Not on file  Occupational History  . Not on file  Social Needs  . Financial resource strain: Not on file  . Food insecurity:    Worry: Not on file    Inability: Not on file  . Transportation needs:    Medical: Not on file    Non-medical: Not on file  Tobacco Use  . Smoking status: Current Every Day Smoker    Packs/day: 1.00    Types: Cigarettes  . Smokeless tobacco: Never Used  Substance and Sexual Activity  . Alcohol use: Yes    Comment: occassional  . Drug use: No  . Sexual activity: Never  Lifestyle  . Physical activity:    Days per week: Not on file    Minutes per session: Not on file  . Stress: Not on file  Relationships  . Social connections:    Talks on phone: Not on file    Gets together: Not on file    Attends religious service: Not on file    Active member of club or organization: Not on file    Attends meetings of clubs or organizations: Not on file    Relationship status: Not on file  . Intimate partner violence:    Fear of current or ex partner: Not on file    Emotionally abused: Not on file    Physically abused: Not on file    Forced sexual activity: Not on file  Other Topics Concern  . Not on file  Social History Narrative  . Not on file      Review of Systems  Constitutional: Negative for activity change, chills, fatigue and unexpected weight change.  HENT: Negative for congestion, nosebleeds, postnasal drip, rhinorrhea and voice change.   Eyes: Negative.   Respiratory: Negative for cough, chest tightness, shortness of breath and wheezing.   Cardiovascular: Negative for chest pain, palpitations and leg swelling.  Gastrointestinal: Negative for constipation, diarrhea, nausea and vomiting.  Endocrine: Negative for cold intolerance, heat intolerance, polydipsia and  polyuria.  Genitourinary: Negative for difficulty urinating, dysuria, flank pain, frequency and urgency.  Musculoskeletal: Positive for back pain.  Skin: Negative for rash.  Allergic/Immunologic: Positive for environmental allergies.  Neurological: Negative for dizziness and headaches.  Hematological: Negative for adenopathy.  Psychiatric/Behavioral: Negative for behavioral problems, dysphoric mood and sleep disturbance. The patient is not nervous/anxious.     Vital Signs: BP 107/60   Pulse 70   Resp 16   Ht 5\' 6"  (1.676 m)   Wt 189 lb (85.7 kg)   SpO2 96%  BMI 30.51 kg/m    Physical Exam  Constitutional: She is oriented to person, place, and time. She appears well-developed and well-nourished. No distress.  HENT:  Head: Normocephalic and atraumatic.  Mouth/Throat: No oropharyngeal exudate.  Eyes: Pupils are equal, round, and reactive to light.  Neck: Normal range of motion. Neck supple. No JVD present. No tracheal deviation present. No thyromegaly present.  Cardiovascular: Normal rate, regular rhythm and normal heart sounds. Exam reveals no gallop and no friction rub.  No murmur heard. Pulmonary/Chest: Effort normal and breath sounds normal. No respiratory distress. She has no wheezes. She has no rales. She exhibits no tenderness.  Abdominal: Soft. Bowel sounds are normal. There is no tenderness.  Genitourinary:  Genitourinary Comments: U/a negative for infection or other abnormalities.   Musculoskeletal: Normal range of motion.  Lymphadenopathy:    She has no cervical adenopathy.  Neurological: She is alert and oriented to person, place, and time. No cranial nerve deficit.  Skin: Skin is warm and dry. She is not diaphoretic.  Psychiatric: She has a normal mood and affect. Her behavior is normal. Judgment and thought content normal.  Nursing note and vitals reviewed.  Assessment/Plan: 1. Urinary tract infection without hematuria, site unspecified Urine sample negative  for infection today. Continue prophylactic treatment with macrobid 100mg  daily.  New patient appointment with urology as scheduled   2. Dysuria - POCT Urinalysis Dipstick negative for infection or other abnormalities today.   General Counseling: Adisen verbalizes understanding of the findings of todays visit and agrees with plan of treatment. I have discussed any further diagnostic evaluation that may be needed or ordered today. We also reviewed her medications today. she has been encouraged to call the office with any questions or concerns that should arise related to todays visit.  This patient was seen by Leretha Pol FNP Collaboration with Dr Lavera Guise as a part of collaborative care agreement  Orders Placed This Encounter  Procedures  . POCT Urinalysis Dipstick     Time spent: 84 Minutes      Dr Lavera Guise Internal medicine

## 2018-08-15 ENCOUNTER — Telehealth: Payer: Self-pay | Admitting: Nurse Practitioner

## 2018-08-15 NOTE — Telephone Encounter (Signed)
LEFT MESSAGE TO RESCHEDULE 03/24/19 [email or web address]

## 2018-09-03 ENCOUNTER — Ambulatory Visit
Admission: RE | Admit: 2018-09-03 | Discharge: 2018-09-03 | Disposition: A | Discharge status: Home / Self Care | Payer: Medicare Other | Source: Ambulatory Visit | Attending: Urology | Admitting: Urology

## 2018-09-03 ENCOUNTER — Encounter: Payer: Self-pay | Admitting: Urology

## 2018-09-03 ENCOUNTER — Ambulatory Visit (INDEPENDENT_AMBULATORY_CARE_PROVIDER_SITE_OTHER): Payer: Medicare Other | Admitting: Urology

## 2018-09-03 VITALS — BP 117/65 | HR 87 | Ht 66.0 in | Wt 189.8 lb

## 2018-09-03 DIAGNOSIS — N200 Calculus of kidney: Secondary | ICD-10-CM

## 2018-09-03 DIAGNOSIS — Z86010 Personal history of colonic polyps: Secondary | ICD-10-CM | POA: Diagnosis not present

## 2018-09-03 DIAGNOSIS — K580 Irritable bowel syndrome with diarrhea: Secondary | ICD-10-CM | POA: Diagnosis not present

## 2018-09-03 LAB — MICROSCOPIC EXAMINATION
Epithelial Cells (non renal): NONE SEEN /hpf (ref 0–10)
WBC, UA: NONE SEEN /hpf (ref 0–5)

## 2018-09-03 LAB — URINALYSIS, COMPLETE
Bilirubin, UA: NEGATIVE
GLUCOSE, UA: NEGATIVE
LEUKOCYTES UA: NEGATIVE
Nitrite, UA: NEGATIVE
PROTEIN UA: NEGATIVE
RBC UA: NEGATIVE
SPEC GRAV UA: 1.025 (ref 1.005–1.030)
Urobilinogen, Ur: 0.2 mg/dL (ref 0.2–1.0)
pH, UA: 5.5 (ref 5.0–7.5)

## 2018-09-03 MED ORDER — NITROFURANTOIN MONOHYD MACRO 100 MG PO CAPS: 100.0000 mg | ORAL_CAPSULE | Freq: Every day | ORAL | 6 refills | Status: DC

## 2018-09-03 NOTE — Progress Notes (Signed)
09/03/2018 2:11 PM   Freddie Apley 28-Apr-1946 [id number]  Referring provider: Lavera Guise, MD [address] [place], [place] [address]  CC: Nephrolithiasis, "recurrent UTIs"  HPI: I saw Ms. Lizaola in urology clinic today in consultation for history of nephrolithiasis and recurrent urinary tract infections from Leretha Pol, NP.  She was previously followed by Dr. Jacqlyn Larsen at [facility].  She has a long history of nephrolithiasis, including PCNL by Dr. Jacqlyn Larsen on the left side in 2014.  She recently underwent microscopic hematuria work-up in June 2019 that showed non-obstructive 0.6 cm stone in the left upper pole, no right-sided stones, no hydronephrosis, no concerning filling defects or renal masses.  Her cystoscopy was reportedly normal.  She does report some vague dull left-sided pain today.  She is unsure if this is musculoskeletal or could be related to her stones.  She denies any gross hematuria.  Her primary complaint is decreased energy over the last few months, which she attributes to possible urinary tract infection.  She states her symptoms are dysuria and increased urgency when she feels like she has a UTI.  On review of her cultures from the past year, there are no true culture proven urinary tract infections with >100 K bacteria.  She has previously been on prophylactic Macrobid 100 mg daily.  There are no aggravating or alleviating factors.  Severity is mild.   PMH: Past Medical History:  Diagnosis Date  . Anxiety 01/02/2012  . Arthritis   . Asthma   . Chronic kidney disease    Kidney stones  . COPD (chronic obstructive pulmonary disease) ([place])   . Diabetes mellitus without complication ([place])   . GERD (gastroesophageal reflux disease)   . HOH (hard of hearing)    Bilateral hearing aids  . Osteoporosis   . Sleep apnea    Does not use C-PAP on a regular basis  . Venous stasis     Surgical History: Past Surgical History:  Procedure Laterality Date  . APPENDECTOMY    .  cataract surgery Bilateral   . CHOLECYSTECTOMY    . COLONOSCOPY    . COLONOSCOPY WITH PROPOFOL N/A 05/28/2015   Procedure: COLONOSCOPY WITH PROPOFOL;  Surgeon: Lollie Sails, MD;  Location: [facility] ENDOSCOPY;  Service: Endoscopy;  Laterality: N/A;  . EYE SURGERY Bilateral 2015   Cataract Extraction with IOL  . LITHOTRIPSY  2015   has had many stones  . Mastoidotomy  1970  . ROTATOR CUFF REPAIR Right 2011  . TOTAL HIP ARTHROPLASTY Right 06/14/2016   Procedure: TOTAL HIP ARTHROPLASTY;  Surgeon: Dereck Leep, MD;  Location: [facility] ORS;  Service: Orthopedics;  Laterality: Right;    Allergies:  Allergies  Allergen Reactions  . Shellfish Allergy Swelling    "shrimp" Betadine okay  . Sulfa Antibiotics Hives    Difficulty breathing  . Sulfasalazine Hives    Difficulty breathing  . Percocet [Oxycodone-Acetaminophen] Hives    Difficulty breathing  . Iodinated Diagnostic Agents Itching  . Iodine Itching    Family History: Family History  Problem Relation Age of Onset  . Breast cancer Maternal Aunt 57  . Bladder Cancer Neg Hx   . Kidney cancer Neg Hx     Social History:  reports that she has been smoking cigarettes. She has been smoking about 1.00 pack per day. She has never used smokeless tobacco. She reports current alcohol use. She reports that she does not use drugs.  ROS: Please see flowsheet from today's date for complete review of  systems.  Physical Exam: BP 117/65 (BP Location: Left Arm, Patient Position: Sitting, Cuff Size: Normal)   Pulse 87   Ht 5\' 6"  (1.676 m)   Wt 189 lb 12.8 oz (86.1 kg)   BMI 30.63 kg/m    Constitutional:  Alert and oriented, No acute distress. Cardiovascular: No clubbing, cyanosis, or edema. Respiratory: Normal respiratory effort, no increased work of breathing. GI: Abdomen is soft, nontender, nondistended, no abdominal masses GU: No CVA tenderness Lymph: No cervical or inguinal lymphadenopathy. Skin: No rashes, bruises or suspicious  lesions. Neurologic: Grossly intact, no focal deficits, moving all 4 extremities. Psychiatric: Normal mood and affect.  Laboratory Data: Urinalysis today 0 WBCs, 0-2 RBCs, few bacteria, nitrite negative  Pertinent Imaging: CT urogram unable to personally review.  Report states there is a 6 mm non--obstructive left upper pole stone.  No right-sided nephrolithiasis.  No hydronephrosis.  No masses or filling defects  KUB reviewed, 10mm left upper pole stone, no stones overlying the ureters.   Assessment & Plan:   In summary, Ms. Carver is a 73 year old female with a long history of nephrolithiasis as well as less clear history of asymptomatic bacteriuria versus recurrent urinary tract infections.  We discussed the evaluation and treatment of patients with recurrent UTIs at length.  We specifically discussed the differences between asymptomatic bacteriuria and true urinary tract infection.  We discussed the AUA definition of recurrent UTI of at least 2 culture proven symptomatic acute cystitis episodes in a 29-month period, or 3 within a 1 year period.  We discussed the importance of culture directed antibiotic treatment, and antibiotic stewardship.  First-line therapy includes nitrofurantoin(5 days), Bactrim(3 days), or fosfomycin(3 g single dose).  Possible etiologies of recurrent infection include periurethral tissue atrophy in postmenopausal woman, constipation, sexual activity, incomplete emptying, anatomic abnormalities, and even genetic predisposition.  Finally, we discussed the role of perineal hygiene, timed voiding, adequate hydration, topical vaginal estrogen, cranberry prophylaxis, and low-dose antibiotic prophylaxis.  We discussed general stone prevention strategies including adequate hydration with goal of producing 2.5 L of urine daily, increasing citric acid intake, increasing calcium intake during high oxalate meals, minimizing animal protein, and decreasing salt intake. Information  about dietary recommendations given today.   Will obtain outside CT films to determine if left sided upper pole stone is amenable to SWL v URS OK to continue daily macrobid for UTI ppx RTC 6 months for symptom check  Billey Co, MD  [facility] [address] [place], [place] [address] [phone]

## 2018-09-04 ENCOUNTER — Telehealth: Payer: Self-pay | Admitting: Urology

## 2018-09-04 NOTE — Telephone Encounter (Signed)
Patient notified of results and informed to get [facility] Ct report

## 2018-09-04 NOTE — Telephone Encounter (Signed)
-----   Message from Billey Co, MD sent at 09/03/2018  2:51 PM EST ----- Regarding: results and follow up Please let her know her urinalysis today does not show any infection, and her KUB showed left kidney stone is stable and not causing blockage.  Her fatigue and low energy are NOT related to urine infection or stones.  Please schedule follow up in 6 months. We will also obtain her CT from [facility] from 12/2017.  Nickolas Madrid, MD 09/03/2018

## 2018-09-04 NOTE — Telephone Encounter (Signed)
-----   Message from Billey Co, MD sent at 09/03/2018  2:51 PM EST ----- Regarding: results and follow up Please let her know her urinalysis today does not show any infection, and her KUB showed left kidney stone is stable and not causing blockage.  Her fatigue and low energy are NOT related to urine infection or stones.  Please schedule follow up in 6 months. We will also obtain her CT from [facility] from 12/2017.  Nickolas Madrid, MD 09/03/2018

## 2018-09-04 NOTE — Telephone Encounter (Signed)
I can make her follow up app but the patient will need to get her CT scan from [facility] I can't get this because I do not have a signed release form from the patient. The patient will have to call [facility] and get this and bring it in. Please let her know this when you call her to give her the results.   Sharyn Lull

## 2018-09-19 ENCOUNTER — Other Ambulatory Visit: Payer: Self-pay | Admitting: Nurse Practitioner

## 2018-09-19 DIAGNOSIS — J449 Chronic obstructive pulmonary disease, unspecified: Secondary | ICD-10-CM

## 2018-09-19 MED ORDER — TIOTROPIUM BROMIDE MONOHYDRATE 18 MCG IN CAPS: 18.0000 ug | ORAL_CAPSULE | Freq: Every day | RESPIRATORY_TRACT | 5 refills | Status: DC

## 2018-09-25 ENCOUNTER — Other Ambulatory Visit: Payer: Self-pay

## 2018-09-25 MED ORDER — METFORMIN HCL 500 MG PO TABS: 250.0000 mg | ORAL_TABLET | Freq: Every day | ORAL | 5 refills | Status: DC

## 2018-10-04 ENCOUNTER — Ambulatory Visit (INDEPENDENT_AMBULATORY_CARE_PROVIDER_SITE_OTHER): Payer: Medicare Other | Admitting: Nurse Practitioner

## 2018-10-04 ENCOUNTER — Encounter: Payer: Self-pay | Admitting: Nurse Practitioner

## 2018-10-04 VITALS — BP 123/56 | HR 80 | Temp 98.3°F | Resp 16 | Ht 66.0 in | Wt 188.6 lb

## 2018-10-04 DIAGNOSIS — R6889 Other general symptoms and signs: Secondary | ICD-10-CM

## 2018-10-04 DIAGNOSIS — J069 Acute upper respiratory infection, unspecified: Secondary | ICD-10-CM | POA: Diagnosis not present

## 2018-10-04 DIAGNOSIS — J029 Acute pharyngitis, unspecified: Secondary | ICD-10-CM | POA: Diagnosis not present

## 2018-10-04 LAB — POCT INFLUENZA A/B
Influenza A, POC: NEGATIVE
Influenza B, POC: NEGATIVE

## 2018-10-04 LAB — POCT RAPID STREP A (OFFICE): Rapid Strep A Screen: NEGATIVE

## 2018-10-04 MED ORDER — AMOXICILLIN 875 MG PO TABS: 875.0000 mg | ORAL_TABLET | Freq: Two times a day (BID) | ORAL | 0 refills | Status: DC

## 2018-10-04 NOTE — Progress Notes (Signed)
[facility] [place], [place] [address]  Internal MEDICINE  Office Visit Note  Patient Name: Amber Holder  [id number]  [id number]  Date of Service: 10/09/2018   Pt is here for a sick visit.  Chief Complaint  Patient presents with  . Sore Throat    sore throat and runny nose for a couple of weeks, throat painful on the left side, now on the right side, right side feels numb,   . Cough    when could get clumps that she coughs up, greyish yellowish tent to them, they are soft,   . Shortness of Breath  . Fever  . Chills  . Headache    on and off     The patient is here for sick visit. She has sore throat, fever, cough, and headache. Cough is productive of green-yellow fluid. She is getting short of breath with exertion as well. First few days, the left side of throat was sore. Today, when she woke up, the right side of her throat was sore. States that she has been taking tylenol every four hours for pain/fever. She has been gargling with warm salt water when needed. Has helped little. She has chills and some body aches. No nausea or vomiting. Eating well and staying hydrated.        Current Medication:  Outpatient Encounter Medications as of 10/04/2018  Medication Sig Note  . acetaminophen (TYLENOL) 500 MG chewable tablet Chew 1,000 mg by mouth every 8 (eight) hours as needed for pain.   Marland Kitchen albuterol (PROVENTIL HFA;VENTOLIN HFA) 108 (90 Base) MCG/ACT inhaler Inhale 2 puffs into the lungs every 6 (six) hours as needed for wheezing or shortness of breath.   Marland Kitchen amoxicillin (AMOXIL) 875 MG tablet Take 1 tablet (875 mg total) by mouth 2 (two) times daily.   . Carboxymethylcell-Hypromellose (GENTEAL) 0.25-0.3 % GEL Apply 1 drop to eye as needed.   . cholecalciferol (VITAMIN D) 400 UNITS TABS tablet Take 2,000 Units by mouth daily.    Marland Kitchen enoxaparin (LOVENOX) 40 MG/0.4ML injection Inject 0.4 mLs (40 mg total) into the skin daily.   . ergocalciferol (DRISDOL)  1.25 MG (50000 UT) capsule Take 1 capsule (50,000 Units total) by mouth once a week.   . furosemide (LASIX) 20 MG tablet TAKE 1 TABLET BY MOUTH DAILY   . glucose blood test strip Blood sugar testing once daily . dx E11.65   . ibandronate (BONIVA) 150 MG tablet Take 1 tablet (150 mg total) by mouth every 30 (thirty) days.   . meloxicam (MOBIC) 7.5 MG tablet Take 1 tablet (7.5 mg total) by mouth as needed for pain.   . metFORMIN (GLUCOPHAGE) 500 MG tablet Take 0.5 tablets (250 mg total) by mouth daily with breakfast.   . montelukast (SINGULAIR) 10 MG tablet TAKE 1 TABLET BY MOUTH DAILY FOR COUGH   . nitrofurantoin, macrocrystal-monohydrate, (MACROBID) 100 MG capsule Take 1 capsule (100 mg total) by mouth daily.   Marland Kitchen omega-3 acid ethyl esters (LOVAZA) 1 G capsule Take by mouth daily. 05/31/2016: Not taking  . pneumococcal 23 valent vaccine (PNEUMOVAX 23) 25 MCG/0.5ML injection Inject 0.72ml IM once (Patient not taking: Reported on 09/03/2018)   . potassium chloride (KLOR-CON) 8 MEQ tablet Take 8 mEq by mouth as needed (only take when taking Lasix).    . tapentadol (NUCYNTA) 50 MG tablet Take 1-2 tablets (50-100 mg total) by mouth every 4 (four) hours as needed for moderate pain.   Marland Kitchen tiotropium (SPIRIVA) 18 MCG  inhalation capsule Place 1 capsule (18 mcg total) into inhaler and inhale daily.    No facility-administered encounter medications on file as of 10/04/2018.       Medical History: Past Medical History:  Diagnosis Date  . Anxiety 01/02/2012  . Arthritis   . Asthma   . Chronic kidney disease    Kidney stones  . COPD (chronic obstructive pulmonary disease) ([place])   . Diabetes mellitus without complication ([place])   . GERD (gastroesophageal reflux disease)   . HOH (hard of hearing)    Bilateral hearing aids  . Osteoporosis   . Sleep apnea    Does not use C-PAP on a regular basis  . Venous stasis      Today's Vitals   10/04/18 1511  BP: (!) 123/56  Pulse: 80  Resp: 16  Temp: 98.3 F  (36.8 C)  SpO2: 95%  Weight: 188 lb 9.6 oz (85.5 kg)  Height: 5\' 6"  (1.676 m)   Body mass index is 30.44 kg/m.  Review of Systems  Constitutional: Positive for chills, fatigue and fever.  HENT: Positive for congestion, ear pain, postnasal drip, rhinorrhea, sinus pressure, sinus pain, sore throat and voice change.   Respiratory: Positive for cough. Negative for wheezing.   Cardiovascular: Negative for chest pain and palpitations.  Gastrointestinal: Positive for nausea. Negative for constipation, diarrhea and vomiting.  Endocrine: Negative for cold intolerance, heat intolerance, polydipsia and polyuria.  Musculoskeletal: Positive for myalgias.  Skin: Negative for rash.  Allergic/Immunologic: Negative for environmental allergies.  Neurological: Positive for headaches. Negative for dizziness.  Psychiatric/Behavioral: Negative.     Physical Exam Vitals signs and nursing note reviewed.  Constitutional:      General: She is not in acute distress.    Appearance: She is well-developed. She is ill-appearing. She is not diaphoretic.  HENT:     Head: Normocephalic and atraumatic.     Nose: Congestion present.     Right Turbinates: Swollen.     Left Turbinates: Swollen.     Right Sinus: Frontal sinus tenderness present.     Left Sinus: Frontal sinus tenderness present.     Mouth/Throat:     Pharynx: Posterior oropharyngeal erythema present. No oropharyngeal exudate.  Eyes:     Pupils: Pupils are equal, round, and reactive to light.  Neck:     Musculoskeletal: Normal range of motion and neck supple.     Thyroid: No thyromegaly.     Vascular: No JVD.     Trachea: No tracheal deviation.  Cardiovascular:     Rate and Rhythm: Normal rate and regular rhythm.     Heart sounds: Normal heart sounds. No murmur. No friction rub. No gallop.   Pulmonary:     Effort: Pulmonary effort is normal. No respiratory distress.     Breath sounds: Normal breath sounds. No wheezing or rales.  Chest:      Chest wall: No tenderness.  Abdominal:     General: Bowel sounds are normal.     Palpations: Abdomen is soft.  Musculoskeletal: Normal range of motion.  Lymphadenopathy:     Cervical: Cervical adenopathy present.  Skin:    General: Skin is warm and dry.  Neurological:     Mental Status: She is alert and oriented to person, place, and time.     Cranial Nerves: No cranial nerve deficit.  Psychiatric:        Behavior: Behavior normal.        Thought Content: Thought content normal.  Judgment: Judgment normal.   Assessment/Plan: 1. Acute upper respiratory infection Flu and strep tests both negative. Treat as URI. Start amoxicillin 875mg  twice daily for 10 days. Rest and increase fluids. Take OTC medication as needed and as indicated.  - amoxicillin (AMOXIL) 875 MG tablet; Take 1 tablet (875 mg total) by mouth 2 (two) times daily.  Dispense: 20 tablet; Refill: 0  2. Flu-like symptoms - POCT Influenza A/B negative today. Treat as upper respiratory infection.   3. Sore throat - POCT rapid strep A negative.   General Counseling: Syanna verbalizes understanding of the findings of todays visit and agrees with plan of treatment. I have discussed any further diagnostic evaluation that may be needed or ordered today. We also reviewed her medications today. she has been encouraged to call the office with any questions or concerns that should arise related to todays visit.    Counseling:  Rest and increase fluids. Continue using OTC medication to control symptoms.   This patient was seen by Leretha Pol FNP Collaboration with Dr Lavera Guise as a part of collaborative care agreement  Orders Placed This Encounter  Procedures  . POCT Influenza A/B  . POCT rapid strep A    Meds ordered this encounter  Medications  . amoxicillin (AMOXIL) 875 MG tablet    Sig: Take 1 tablet (875 mg total) by mouth 2 (two) times daily.    Dispense:  20 tablet    Refill:  0    Order Specific  Question:   Supervising Provider    Answer:   Lavera Guise [[id number]]    Time spent: 25 Minutes

## 2018-10-07 ENCOUNTER — Ambulatory Visit (INDEPENDENT_AMBULATORY_CARE_PROVIDER_SITE_OTHER): Payer: Medicare Other | Admitting: Internal Medicine

## 2018-10-07 ENCOUNTER — Encounter: Payer: Self-pay | Admitting: Internal Medicine

## 2018-10-07 VITALS — BP 122/60 | HR 81 | Resp 16 | Ht 66.0 in | Wt 188.0 lb

## 2018-10-07 DIAGNOSIS — K219 Gastro-esophageal reflux disease without esophagitis: Secondary | ICD-10-CM | POA: Diagnosis not present

## 2018-10-07 DIAGNOSIS — J449 Chronic obstructive pulmonary disease, unspecified: Secondary | ICD-10-CM

## 2018-10-07 DIAGNOSIS — J0110 Acute frontal sinusitis, unspecified: Secondary | ICD-10-CM

## 2018-10-07 NOTE — Progress Notes (Signed)
[facility] [place], [place] [address]  Pulmonary Sleep Medicine   Office Visit Note  Patient Name: Amber Holder DOB: June 25, 1946 MRN [id number]  Date of Service: 10/18/2018  Complaints/HPI: Patient is here for one-year follow-up on COPD.  She is unable to do her Spiriva today because she has been sick for 3 days with sinus issues.  Patient denies any fever but reports some chills.  She has had some improvement since Thursday however she still feels poorly.  Her breathing has been good otherwise and she has been using DayQuil.  She has a home O2 concentrator for nighttime however she does not use it anymore.  She has had it for about 9 months and would like to have it removed at this time.  ROS  General: (-) fever, (-) chills, (-) night sweats, (-) weakness Skin: (-) rashes, (-) itching,. Eyes: (-) visual changes, (-) redness, (-) itching. Nose and Sinuses: (-) nasal stuffiness or itchiness, (-) postnasal drip, (-) nosebleeds, (-) sinus trouble. Mouth and Throat: (-) sore throat, (-) hoarseness. Neck: (-) swollen glands, (-) enlarged thyroid, (-) neck pain. Respiratory:  cough, (-) bloody sputum, - shortness of breath, - wheezing. Cardiovascular: - ankle swelling, (-) chest pain. Lymphatic: (-) lymph node enlargement. Neurologic: (-) numbness, (-) tingling. Psychiatric: (-) anxiety, (-) depression   Current Medication: Outpatient Encounter Medications as of 10/07/2018  Medication Sig Note  . acetaminophen (TYLENOL) 500 MG chewable tablet Chew 1,000 mg by mouth every 8 (eight) hours as needed for pain.   Marland Kitchen albuterol (PROVENTIL HFA;VENTOLIN HFA) 108 (90 Base) MCG/ACT inhaler Inhale 2 puffs into the lungs every 6 (six) hours as needed for wheezing or shortness of breath.   Marland Kitchen amoxicillin (AMOXIL) 875 MG tablet Take 1 tablet (875 mg total) by mouth 2 (two) times daily.   . Carboxymethylcell-Hypromellose (GENTEAL) 0.25-0.3 % GEL Apply 1 drop to eye as needed.    . cholecalciferol (VITAMIN D) 400 UNITS TABS tablet Take 2,000 Units by mouth daily.    Marland Kitchen enoxaparin (LOVENOX) 40 MG/0.4ML injection Inject 0.4 mLs (40 mg total) into the skin daily.   . ergocalciferol (DRISDOL) 1.25 MG (50000 UT) capsule Take 1 capsule (50,000 Units total) by mouth once a week.   . furosemide (LASIX) 20 MG tablet TAKE 1 TABLET BY MOUTH DAILY   . glucose blood test strip Blood sugar testing once daily . dx E11.65   . ibandronate (BONIVA) 150 MG tablet Take 1 tablet (150 mg total) by mouth every 30 (thirty) days.   . meloxicam (MOBIC) 7.5 MG tablet Take 1 tablet (7.5 mg total) by mouth as needed for pain.   . metFORMIN (GLUCOPHAGE) 500 MG tablet Take 0.5 tablets (250 mg total) by mouth daily with breakfast.   . montelukast (SINGULAIR) 10 MG tablet TAKE 1 TABLET BY MOUTH DAILY FOR COUGH   . nitrofurantoin, macrocrystal-monohydrate, (MACROBID) 100 MG capsule Take 1 capsule (100 mg total) by mouth daily.   Marland Kitchen omega-3 acid ethyl esters (LOVAZA) 1 G capsule Take by mouth daily. 05/31/2016: Not taking  . pneumococcal 23 valent vaccine (PNEUMOVAX 23) 25 MCG/0.5ML injection Inject 0.36ml IM once (Patient not taking: Reported on 09/03/2018)   . potassium chloride (KLOR-CON) 8 MEQ tablet Take 8 mEq by mouth as needed (only take when taking Lasix).    . tapentadol (NUCYNTA) 50 MG tablet Take 1-2 tablets (50-100 mg total) by mouth every 4 (four) hours as needed for moderate pain.   Marland Kitchen tiotropium (SPIRIVA) 18 MCG inhalation  capsule Place 1 capsule (18 mcg total) into inhaler and inhale daily.    No facility-administered encounter medications on file as of 10/07/2018.     Surgical History: Past Surgical History:  Procedure Laterality Date  . APPENDECTOMY    . cataract surgery Bilateral   . CHOLECYSTECTOMY    . COLONOSCOPY    . COLONOSCOPY WITH PROPOFOL N/A 05/28/2015   Procedure: COLONOSCOPY WITH PROPOFOL;  Surgeon: Lollie Sails, MD;  Location: [facility] ENDOSCOPY;  Service: Endoscopy;   Laterality: N/A;  . EYE SURGERY Bilateral 2015   Cataract Extraction with IOL  . LITHOTRIPSY  2015   has had many stones  . Mastoidotomy  1970  . ROTATOR CUFF REPAIR Right 2011  . TOTAL HIP ARTHROPLASTY Right 06/14/2016   Procedure: TOTAL HIP ARTHROPLASTY;  Surgeon: Dereck Leep, MD;  Location: [facility] ORS;  Service: Orthopedics;  Laterality: Right;    Medical History: Past Medical History:  Diagnosis Date  . Anxiety 01/02/2012  . Arthritis   . Asthma   . Chronic kidney disease    Kidney stones  . COPD (chronic obstructive pulmonary disease) ([place])   . Diabetes mellitus without complication ([place])   . GERD (gastroesophageal reflux disease)   . HOH (hard of hearing)    Bilateral hearing aids  . Osteoporosis   . Sleep apnea    Does not use C-PAP on a regular basis  . Venous stasis     Family History: Family History  Problem Relation Age of Onset  . Breast cancer Maternal Aunt 57  . Bladder Cancer Neg Hx   . Kidney cancer Neg Hx     Social History: Social History   Socioeconomic History  . Marital status: Divorced    Spouse name: Not on file  . Number of children: Not on file  . Years of education: Not on file  . Highest education level: Not on file  Occupational History  . Not on file  Social Needs  . Financial resource strain: Not on file  . Food insecurity:    Worry: Not on file    Inability: Not on file  . Transportation needs:    Medical: Not on file    Non-medical: Not on file  Tobacco Use  . Smoking status: Current Every Day Smoker    Packs/day: 1.00    Types: Cigarettes  . Smokeless tobacco: Never Used  Substance and Sexual Activity  . Alcohol use: Yes    Comment: occassional  . Drug use: No  . Sexual activity: Never  Lifestyle  . Physical activity:    Days per week: Not on file    Minutes per session: Not on file  . Stress: Not on file  Relationships  . Social connections:    Talks on phone: Not on file    Gets together: Not on file     Attends religious service: Not on file    Active member of club or organization: Not on file    Attends meetings of clubs or organizations: Not on file    Relationship status: Not on file  . Intimate partner violence:    Fear of current or ex partner: Not on file    Emotionally abused: Not on file    Physically abused: Not on file    Forced sexual activity: Not on file  Other Topics Concern  . Not on file  Social History Narrative  . Not on file    Vital Signs: Blood pressure 122/60, pulse 81, resp.  rate 16, height 5\' 6"  (1.676 m), weight 188 lb (85.3 kg), SpO2 96 %.  Examination: General Appearance: The patient is well-developed, well-nourished, and in no distress. Skin: Gross inspection of skin unremarkable. Head: normocephalic, no gross deformities. Eyes: no gross deformities noted. ENT: ears appear grossly normal no exudates. Neck: Supple. No thyromegaly. No LAD. Respiratory: clear bilateraly. Cardiovascular: Normal S1 and S2 without murmur or rub. Extremities: No cyanosis. pulses are equal. Neurologic: Alert and oriented. No involuntary movements.  LABS: Recent Results (from the past 2160 hour(s))  CULTURE, URINE COMPREHENSIVE     Status: None   Collection Time: 07/23/18 11:30 AM  Result Value Ref Range   Urine Culture, Comprehensive Final report    Organism ID, Bacteria Comment     Comment: Mixed urogenital flora 6,000  Colonies/mL   POCT Urinalysis Dipstick     Status: Abnormal   Collection Time: 07/23/18 11:48 AM  Result Value Ref Range   Color, UA     Clarity, UA     Glucose, UA Negative Negative   Bilirubin, UA negative    Ketones, UA negative    Spec Grav, UA <=1.005 (A) 1.010 - 1.025   Blood, UA trace    pH, UA 7.0 5.0 - 8.0   Protein, UA Negative Negative   Urobilinogen, UA 0.2 0.2 or 1.0 E.U./dL   Nitrite, UA negative    Leukocytes, UA Small (1+) (A) Negative   Appearance     Odor    POCT HgB A1C     Status: Abnormal   Collection Time: 07/23/18  11:48 AM  Result Value Ref Range   Hemoglobin A1C 5.8 (A) 4.0 - 5.6 %   HbA1c POC (<> result, manual entry)     HbA1c, POC (prediabetic range)     HbA1c, POC (controlled diabetic range)    POCT Urinalysis Dipstick     Status: None   Collection Time: 08/06/18 11:47 AM  Result Value Ref Range   Color, UA     Clarity, UA     Glucose, UA Negative Negative   Bilirubin, UA Negative    Ketones, UA Negative    Spec Grav, UA 1.010 1.010 - 1.025   Blood, UA Negative    pH, UA 5.0 5.0 - 8.0   Protein, UA Negative Negative   Urobilinogen, UA 0.2 0.2 or 1.0 E.U./dL   Nitrite, UA Negative    Leukocytes, UA Negative Negative   Appearance     Odor    Urinalysis, Complete     Status: Abnormal   Collection Time: 09/03/18  2:21 PM  Result Value Ref Range   Specific Gravity, UA 1.025 1.005 - 1.030   pH, UA 5.5 5.0 - 7.5   Color, UA Yellow Yellow   Appearance Ur Clear Clear   Leukocytes, UA Negative Negative   Protein, UA Negative Negative/Trace   Glucose, UA Negative Negative   Ketones, UA Trace (A) Negative   RBC, UA Negative Negative   Bilirubin, UA Negative Negative   Urobilinogen, Ur 0.2 0.2 - 1.0 mg/dL   Nitrite, UA Negative Negative   Microscopic Examination See below:   Microscopic Examination     Status: Abnormal   Collection Time: 09/03/18  2:21 PM  Result Value Ref Range   WBC, UA None seen 0 - 5 /hpf   RBC, UA 0-2 0 - 2 /hpf   Epithelial Cells (non renal) None seen 0 - 10 /hpf   Casts Present (A) None seen /lpf   Cast  Type Hyaline casts N/A   Mucus, UA Present (A) Not Estab.   Bacteria, UA Few (A) None seen/Few  POCT Influenza A/B     Status: None   Collection Time: 10/04/18  3:55 PM  Result Value Ref Range   Influenza A, POC Negative Negative   Influenza B, POC Negative Negative  POCT rapid strep A     Status: None   Collection Time: 10/04/18  3:55 PM  Result Value Ref Range   Rapid Strep A Screen Negative Negative    Radiology: Abdomen 1 View (kub)  Result  Date: 09/04/2018 CLINICAL DATA:  Initial evaluation for left-sided kidney stones. EXAM: ABDOMEN - 1 VIEW COMPARISON:  Prior CT from 06/02/2013. FINDINGS: 7 mm calcification overlies the superior aspect of the left renal shadow, suggesting left-sided nephrolithiasis. No other definite radiopaque calculi seen overlying left kidney along the course of the left ureter. Probable faint punctate right renal nephrolithiasis noted as well. No stones seen along the course of the right renal collecting system. Somewhat linear calcific density overlying the right transverse process of L3 favored to be vascular. Bowel gas pattern within normal limits. Cholecystectomy clips noted. Right total hip arthroplasty noted. IMPRESSION: 1. 7 mm calcification overlying the superior aspect of the left renal shadow, suggesting left-sided nephrolithiasis. 2. Probable faint punctate right renal nephrolithiasis. Electronically Signed   By: Jeannine Boga M.D.   On: 09/04/2018 03:53    No results found.  No results found.    Assessment and Plan: Patient Active Problem List   Diagnosis Date Noted  . Acute upper respiratory infection 10/09/2018  . Flu-like symptoms 10/09/2018  . Sore throat 10/09/2018  . Vitamin D deficiency 07/27/2018  . Need for vaccination against Streptococcus pneumoniae using pneumococcal conjugate vaccine 7 07/27/2018  . Encounter for general adult medical examination with abnormal findings 04/07/2018  . Primary generalized (osteo)arthritis 04/07/2018  . Hematuria, microscopic 12/18/2017  . Renal colic 29/92/4268  . Urinary tract infection without hematuria 11/21/2017  . Dysuria 11/21/2017  . Uncontrolled type 2 diabetes mellitus with hyperglycemia ([place]) 11/21/2017  . Diabetes mellitus type 2, uncomplicated ([place]) 34/19/6222  . Osteoporosis 10/04/2017  . S/P total hip arthroplasty 06/14/2016  . Inguinal hernia, left 06/09/2013  . Umbilical hernia 97/98/9211  . Increased frequency of urination  11/27/2012  . Chronic cystitis 11/19/2012  . Incomplete emptying of bladder 11/19/2012  . Medullary sponge kidney 11/19/2012  . Mixed urge and stress incontinence 11/19/2012  . Anxiety 01/02/2012  . Calculus of kidney 01/02/2012  . COPD (chronic obstructive pulmonary disease) ([place]) 01/02/2012  . GERD (gastroesophageal reflux disease) 01/02/2012  . Hearing loss 01/02/2012  . Hyperlipidemia, unspecified 01/02/2012  . Obesity, unspecified 01/02/2012  . Sleep apnea 01/02/2012  . Tobacco abuse 01/02/2012  . Venous insufficiency 01/02/2012    1. Acute non-recurrent frontal sinusitis Patient prescribed a course of Augmentin.  I asked her to continue take over-the-counter medications for symptom management.  Return to clinic in 7 to days if symptoms fail to improve.  2. Chronic obstructive pulmonary disease, unspecified COPD type ([place]) Stable at this time.  Encourage patient to continue to use inhaler and other medications as prescribed.  3. Gastroesophageal reflux disease without esophagitis Stable, continue current treatment.  General Counseling: I have discussed the findings of the evaluation and examination with Tyanne.  I have also discussed any further diagnostic evaluation thatmay be needed or ordered today. Kaleeya verbalizes understanding of the findings of todays visit. We also reviewed her medications today and discussed  drug interactions and side effects including but not limited excessive drowsiness and altered mental states. We also discussed that there is always a risk not just to her but also people around her. she has been encouraged to call the office with any questions or concerns that should arise related to todays visit.    Time spent: 25 This patient was seen by Orson Gear AGNP-C in Collaboration with Dr. Devona Konig as a part of collaborative care agreement.   I have personally obtained a history, examined the patient, evaluated laboratory and imaging results,  formulated the assessment and plan and placed orders.    Allyne Gee, MD [facility] Pulmonary and Critical Care Sleep medicine

## 2018-10-09 DIAGNOSIS — J069 Acute upper respiratory infection, unspecified: Secondary | ICD-10-CM | POA: Insufficient documentation

## 2018-10-09 DIAGNOSIS — R6889 Other general symptoms and signs: Secondary | ICD-10-CM | POA: Insufficient documentation

## 2018-10-09 DIAGNOSIS — J029 Acute pharyngitis, unspecified: Secondary | ICD-10-CM | POA: Insufficient documentation

## 2018-10-28 ENCOUNTER — Ambulatory Visit
Admission: RE | Admit: 2018-10-28 | Discharge: 2018-10-28 | Disposition: A | Discharge status: Home / Self Care | Payer: Medicare Other | Attending: Gastroenterology | Admitting: Gastroenterology

## 2018-10-28 ENCOUNTER — Ambulatory Visit: Payer: Medicare Other | Admitting: Anesthesiology

## 2018-10-28 ENCOUNTER — Encounter
Admission: RE | Disposition: A | Discharge status: Home / Self Care | Payer: Self-pay | Source: Home / Self Care | Attending: Gastroenterology

## 2018-10-28 ENCOUNTER — Encounter: Payer: Self-pay | Admitting: *Deleted

## 2018-10-28 DIAGNOSIS — K219 Gastro-esophageal reflux disease without esophagitis: Secondary | ICD-10-CM | POA: Diagnosis not present

## 2018-10-28 DIAGNOSIS — Z7984 Long term (current) use of oral hypoglycemic drugs: Secondary | ICD-10-CM | POA: Insufficient documentation

## 2018-10-28 DIAGNOSIS — J449 Chronic obstructive pulmonary disease, unspecified: Secondary | ICD-10-CM | POA: Insufficient documentation

## 2018-10-28 DIAGNOSIS — F419 Anxiety disorder, unspecified: Secondary | ICD-10-CM | POA: Diagnosis not present

## 2018-10-28 DIAGNOSIS — M1990 Unspecified osteoarthritis, unspecified site: Secondary | ICD-10-CM | POA: Insufficient documentation

## 2018-10-28 DIAGNOSIS — N189 Chronic kidney disease, unspecified: Secondary | ICD-10-CM | POA: Insufficient documentation

## 2018-10-28 DIAGNOSIS — E1122 Type 2 diabetes mellitus with diabetic chronic kidney disease: Secondary | ICD-10-CM | POA: Diagnosis not present

## 2018-10-28 DIAGNOSIS — Z1211 Encounter for screening for malignant neoplasm of colon: Secondary | ICD-10-CM | POA: Insufficient documentation

## 2018-10-28 DIAGNOSIS — G4730 Sleep apnea, unspecified: Secondary | ICD-10-CM | POA: Diagnosis not present

## 2018-10-28 DIAGNOSIS — Z86010 Personal history of colonic polyps: Secondary | ICD-10-CM | POA: Insufficient documentation

## 2018-10-28 DIAGNOSIS — K5520 Angiodysplasia of colon without hemorrhage: Secondary | ICD-10-CM | POA: Diagnosis not present

## 2018-10-28 DIAGNOSIS — K5289 Other specified noninfective gastroenteritis and colitis: Secondary | ICD-10-CM | POA: Diagnosis not present

## 2018-10-28 DIAGNOSIS — K5730 Diverticulosis of large intestine without perforation or abscess without bleeding: Secondary | ICD-10-CM | POA: Insufficient documentation

## 2018-10-28 DIAGNOSIS — Z79899 Other long term (current) drug therapy: Secondary | ICD-10-CM | POA: Insufficient documentation

## 2018-10-28 DIAGNOSIS — R197 Diarrhea, unspecified: Secondary | ICD-10-CM | POA: Diagnosis not present

## 2018-10-28 DIAGNOSIS — K5790 Diverticulosis of intestine, part unspecified, without perforation or abscess without bleeding: Secondary | ICD-10-CM | POA: Diagnosis not present

## 2018-10-28 HISTORY — DX: Polyp of colon: K63.5

## 2018-10-28 HISTORY — PX: COLONOSCOPY WITH PROPOFOL: SHX5780

## 2018-10-28 LAB — GLUCOSE, CAPILLARY: GLUCOSE-CAPILLARY: 102 mg/dL — AB (ref 70–99)

## 2018-10-28 SURGERY — COLONOSCOPY WITH PROPOFOL
Anesthesia: General

## 2018-10-28 MED ORDER — PROPOFOL 500 MG/50ML IV EMUL
INTRAVENOUS | Status: DC | PRN
  Administered 2018-10-28: 100 ug/kg/min via INTRAVENOUS

## 2018-10-28 MED ORDER — SODIUM CHLORIDE 0.9 % IV SOLN
INTRAVENOUS | Status: DC
  Administered 2018-10-28: 08:00:00 via INTRAVENOUS

## 2018-10-28 MED ORDER — PROPOFOL 10 MG/ML IV BOLUS
INTRAVENOUS | Status: DC | PRN
  Administered 2018-10-28: 20 mg via INTRAVENOUS
  Administered 2018-10-28: 50 mg via INTRAVENOUS

## 2018-10-28 MED ORDER — PROPOFOL 500 MG/50ML IV EMUL
INTRAVENOUS | Status: AC
  Filled 2018-10-28: qty 50

## 2018-10-28 MED ORDER — SODIUM CHLORIDE 0.9 % IV SOLN
INTRAVENOUS | Status: AC
  Administered 2018-10-28: 2 g via INTRAVENOUS
  Filled 2018-10-28: qty 2000

## 2018-10-28 MED ORDER — PHENYLEPHRINE HCL 10 MG/ML IJ SOLN
INTRAMUSCULAR | Status: AC
  Filled 2018-10-28: qty 1

## 2018-10-28 MED ORDER — LIDOCAINE HCL (CARDIAC) PF 100 MG/5ML IV SOSY
PREFILLED_SYRINGE | INTRAVENOUS | Status: DC | PRN
  Administered 2018-10-28: 80 mg via INTRATRACHEAL

## 2018-10-28 MED ORDER — LIDOCAINE HCL (PF) 2 % IJ SOLN
INTRAMUSCULAR | Status: AC
  Filled 2018-10-28: qty 10

## 2018-10-28 MED ORDER — PHENYLEPHRINE HCL 10 MG/ML IJ SOLN
INTRAMUSCULAR | Status: DC | PRN
  Administered 2018-10-28 (×2): 100 ug via INTRAVENOUS

## 2018-10-28 MED ORDER — SODIUM CHLORIDE 0.9 % IV SOLN
2.0000 g | Freq: Once | INTRAVENOUS | Status: AC
  Administered 2018-10-28: 2 g via INTRAVENOUS

## 2018-10-28 NOTE — H&P (Signed)
Outpatient short stay form Pre-procedure 10/28/2018 8:13 AM Amber Sails MD  Primary Physician: Dr. Clayborn Bigness  Reason for visit: Colonoscopy  History of present illness: Patient is a 73 year old female sending today for colonoscopy in regards her personal history of adenomatous colon polyps.  Does have a history of IbSD.  Tolerated her prep well.  She takes no aspirin or blood thinning agent.    Current Facility-Administered Medications:  .  0.9 %  sodium chloride infusion, , Intravenous, Continuous, Amber Sails, MD, Last Rate: 20 mL/hr at 10/28/18 0806 .  ampicillin (OMNIPEN) 2 g in sodium chloride 0.9 % 100 mL IVPB, 2 g, Intravenous, Once, Amber Sails, MD, Last Rate: 300 mL/hr at 10/28/18 0809, 2 g at 10/28/18 0809  Medications Prior to Admission  Medication Sig Dispense Refill Last Dose  . albuterol (PROVENTIL HFA;VENTOLIN HFA) 108 (90 Base) MCG/ACT inhaler Inhale 2 puffs into the lungs every 6 (six) hours as needed for wheezing or shortness of breath. 1 Inhaler 5 Past Month at Unknown time  . cholecalciferol (VITAMIN D) 400 UNITS TABS tablet Take 2,000 Units by mouth daily.    Past Week at Unknown time  . ergocalciferol (DRISDOL) 1.25 MG (50000 UT) capsule Take 1 capsule (50,000 Units total) by mouth once a week. 4 capsule 5 Past Week at Unknown time  . furosemide (LASIX) 20 MG tablet TAKE 1 TABLET BY MOUTH DAILY 90 tablet 2 Past Week at Unknown time  . Magnesium 250 MG TABS Take by mouth daily.   Past Week at Unknown time  . meloxicam (MOBIC) 7.5 MG tablet Take 1 tablet (7.5 mg total) by mouth as needed for pain. 30 tablet 1 Past Month at Unknown time  . metFORMIN (GLUCOPHAGE) 500 MG tablet Take 0.5 tablets (250 mg total) by mouth daily with breakfast. 30 tablet 5 Past Week at Unknown time  . montelukast (SINGULAIR) 10 MG tablet TAKE 1 TABLET BY MOUTH DAILY FOR COUGH 90 tablet 4 Past Week at Unknown time  . omega-3 acid ethyl esters (LOVAZA) 1 G capsule Take by mouth  daily.   Past Week at Unknown time  . potassium chloride (KLOR-CON) 8 MEQ tablet Take 8 mEq by mouth as needed (only take when taking Lasix).    10/27/2018 at Unknown time  . tiotropium (SPIRIVA) 18 MCG inhalation capsule Place 1 capsule (18 mcg total) into inhaler and inhale daily. 30 capsule 5 10/28/2018 at 0600  . acetaminophen (TYLENOL) 500 MG chewable tablet Chew 1,000 mg by mouth every 8 (eight) hours as needed for pain.   Taking  . amoxicillin (AMOXIL) 875 MG tablet Take 1 tablet (875 mg total) by mouth 2 (two) times daily. 20 tablet 0   . Carboxymethylcell-Hypromellose (GENTEAL) 0.25-0.3 % GEL Apply 1 drop to eye as needed.   Taking  . enoxaparin (LOVENOX) 40 MG/0.4ML injection Inject 0.4 mLs (40 mg total) into the skin daily. (Patient not taking: Reported on 10/28/2018) 14 Syringe 0 Completed Course at Unknown time  . glucose blood test strip Blood sugar testing once daily . dx E11.65 100 each 3   . ibandronate (BONIVA) 150 MG tablet Take 1 tablet (150 mg total) by mouth every 30 (thirty) days. 12 tablet 0   . nitrofurantoin, macrocrystal-monohydrate, (MACROBID) 100 MG capsule Take 1 capsule (100 mg total) by mouth daily. 30 capsule 6   . pneumococcal 23 valent vaccine (PNEUMOVAX 23) 25 MCG/0.5ML injection Inject 0.32ml IM once (Patient not taking: Reported on 09/03/2018) 0.5 mL 0 Not  Taking  . tapentadol (NUCYNTA) 50 MG tablet Take 1-2 tablets (50-100 mg total) by mouth every 4 (four) hours as needed for moderate pain. (Patient not taking: Reported on 10/25/2018) 30 tablet 0 Completed Course at Unknown time     Allergies  Allergen Reactions  . Shellfish Allergy Swelling    "shrimp" Betadine okay  . Sulfa Antibiotics Hives    Difficulty breathing  . Sulfasalazine Hives    Difficulty breathing  . Percocet [Oxycodone-Acetaminophen] Hives    Difficulty breathing  . Iodinated Diagnostic Agents Itching  . Iodine Itching     Past Medical History:  Diagnosis Date  . Anxiety 01/02/2012  .  Arthritis   . Asthma   . Chronic kidney disease    Kidney stones  . Colon polyps   . COPD (chronic obstructive pulmonary disease) ([place])   . Diabetes mellitus without complication ([place])   . GERD (gastroesophageal reflux disease)   . HOH (hard of hearing)    Bilateral hearing aids  . Osteoporosis   . Sleep apnea    Does not use C-PAP on a regular basis  . Venous stasis     Review of systems:      Physical Exam    Heart and lungs: Regular rate and rhythm without rub or gallop, lungs are bilaterally clear.    HEENT: Normocephalic atraumatic eyes are anicteric    Other:    Pertinant exam for procedure: Soft nontender nondistended bowel sounds positive normoactive    Planned proceedures: Colonoscopy and indicated procedures I have discussed the risks benefits and complications of procedures to include not limited to bleeding, infection, perforation and the risk of sedation and the patient wishes to proceed.    Amber Sails, MD Gastroenterology 10/28/2018  8:13 AM

## 2018-10-28 NOTE — Transfer of Care (Signed)
Immediate Anesthesia Transfer of Care Note  Patient: Amber Holder  Procedure(s) Performed: COLONOSCOPY WITH PROPOFOL (N/A )  Patient Location: Endoscopy Unit  Anesthesia Type:General  Level of Consciousness: awake and drowsy  Airway & Oxygen Therapy: Patient Spontanous Breathing and Patient connected to nasal cannula oxygen  Post-op Assessment: Report given to RN and Post -op Vital signs reviewed and stable  Post vital signs: stable  Last Vitals:  Vitals Value Taken Time  BP 103/53 10/28/2018  8:50 AM  Temp 36.1 C 10/28/2018  8:49 AM  Pulse 68 10/28/2018  8:52 AM  Resp 17 10/28/2018  8:52 AM  SpO2 100 % 10/28/2018  8:52 AM  Vitals shown include unvalidated device data.  Last Pain:  Vitals:   10/28/18 0849  TempSrc: Tympanic  PainSc: 0-No pain         Complications: No apparent anesthesia complications

## 2018-10-28 NOTE — Anesthesia Post-op Follow-up Note (Signed)
Anesthesia QCDR form completed.        

## 2018-10-28 NOTE — Op Note (Signed)
[facility] Gastroenterology Patient Name: Amber Holder Procedure Date: 10/28/2018 8:01 AM MRN: [id number] Account #: [id number] Date of Birth: [birth year] Admit Type: Outpatient Age: 73 Room: [facility] ENDO ROOM 3 Gender: Female Note Status: Finalized Procedure:            Colonoscopy Indications:          Personal history of colonic polyps Providers:            Lollie Sails, MD Medicines:            Monitored Anesthesia Care Complications:        No immediate complications. Procedure:            Pre-Anesthesia Assessment:                       - ASA Grade Assessment: III - A patient with severe                        systemic disease.                       After obtaining informed consent, the colonoscope was                        passed under direct vision. Throughout the procedure,                        the patient's blood pressure, pulse, and oxygen                        saturations were monitored continuously. The                        Colonoscope was introduced through the anus and                        advanced to the the cecum, identified by appendiceal                        orifice and ileocecal valve. The patient tolerated the                        procedure well. The colonoscopy was performed with                        moderate difficulty due to significant looping.                        Successful completion of the procedure was aided by                        using manual pressure. The quality of the bowel                        preparation was good. Findings:      A few medium-mouthed diverticula were found in the sigmoid colon.      A few small localized angioectasias without bleeding, deep to mucosa,       were found in the proximal ascending colon and in the cecum.      The retroflexed view of the distal rectum and anal verge was normal  and       showed no anal or rectal abnormalities.      Biopsies for histology were taken with a  cold forceps from the right       colon and left colon for evaluation of microscopic colitis.      The exam was otherwise without abnormality.      The digital rectal exam was normal.      A tattoo was seen at 17 cm proximal to the anus. The tattoo site       appeared normal, no residual polyp. Impression:           - Diverticulosis in the sigmoid colon.                       - A few non-bleeding colonic angioectasias.                       - The distal rectum and anal verge are normal on                        retroflexion view.                       - The examination was otherwise normal.                       - Biopsies were taken with a cold forceps from the                        right colon and left colon for evaluation of                        microscopic colitis. Recommendation:       - Discharge patient to home.                       - Soft diet today, then advance as tolerated to advance                        diet as tolerated. Lollie Sails, MD 10/28/2018 8:47:47 AM This report has been signed electronically. Number of Addenda: 0 Note Initiated On: 10/28/2018 8:01 AM Scope Withdrawal Time: 0 hours 9 minutes 2 seconds  Total Procedure Duration: 0 hours 18 minutes 19 seconds       [facility]

## 2018-10-28 NOTE — Anesthesia Preprocedure Evaluation (Addendum)
Anesthesia Evaluation  Patient identified by MRN, date of birth, ID band Patient awake    Reviewed: Allergy & Precautions, H&P , NPO status , Patient's Chart, lab work & pertinent test results  History of Anesthesia Complications Negative for: history of anesthetic complications  Airway Mallampati: III  TM Distance: <3 FB Neck ROM: limited    Dental  (+) Poor Dentition, Chipped, Caps, Dental Advidsory Given   Pulmonary shortness of breath and with exertion, asthma , sleep apnea , COPD, neg recent URI, Current Smoker,           Cardiovascular Exercise Tolerance: Good (-) angina(-) Past MI negative cardio ROS       Neuro/Psych PSYCHIATRIC DISORDERS Anxiety negative neurological ROS     GI/Hepatic Neg liver ROS, GERD  Controlled,  Endo/Other  diabetes, Type 2  Renal/GU CRFRenal disease     Musculoskeletal  (+) Arthritis ,   Abdominal   Peds  Hematology negative hematology ROS (+)   Anesthesia Other Findings Past Medical History: No date: Arthritis No date: Asthma No date: Chronic kidney disease     Comment: Kidney stones No date: COPD (chronic obstructive pulmonary disease) (* No date: Diabetes mellitus without complication (HCC) No date: GERD (gastroesophageal reflux disease) No date: HOH (hard of hearing)     Comment: Bilateral hearing aids No date: Osteoporosis No date: Sleep apnea     Comment: Does not use C-PAP on a regular basis No date: Venous stasis  Past Surgical History: No date: APPENDECTOMY No date: cataract surgery Bilateral No date: CHOLECYSTECTOMY No date: COLONOSCOPY 05/28/2015: COLONOSCOPY WITH PROPOFOL N/A     Comment: Procedure: COLONOSCOPY WITH PROPOFOL;                Surgeon: Lollie Sails, MD;  Location: [facility]              ENDOSCOPY;  Service: Endoscopy;  Laterality:               N/A; 2015: EYE SURGERY Bilateral     Comment: Cataract Extraction with IOL 2015: LITHOTRIPSY    Comment: has had many stones 1970: Mastoidotomy 2011: [place] Right  BMI    Body Mass Index:  29.12 kg/m      Reproductive/Obstetrics negative OB ROS                            Anesthesia Physical  Anesthesia Plan  ASA: III  Anesthesia Plan: General   Post-op Pain Management:    Induction:   PONV Risk Score and Plan: 2 and Propofol infusion and TIVA  Airway Management Planned:   Additional Equipment:   Intra-op Plan:   Post-operative Plan:   Informed Consent: I have reviewed the patients History and Physical, chart, labs and discussed the procedure including the risks, benefits and alternatives for the proposed anesthesia with the patient or authorized representative who has indicated his/her understanding and acceptance.       Plan Discussed with: Anesthesiologist, CRNA and Surgeon  Anesthesia Plan Comments:         Anesthesia Quick Evaluation

## 2018-10-28 NOTE — Anesthesia Postprocedure Evaluation (Signed)
Anesthesia Post Note  Patient: Rickesha Veracruz  Procedure(s) Performed: COLONOSCOPY WITH PROPOFOL (N/A )  Patient location during evaluation: Endoscopy Anesthesia Type: General Level of consciousness: awake and alert Pain management: pain level controlled Vital Signs Assessment: post-procedure vital signs reviewed and stable Respiratory status: spontaneous breathing, nonlabored ventilation, respiratory function stable and patient connected to nasal cannula oxygen Cardiovascular status: blood pressure returned to baseline and stable Postop Assessment: no apparent nausea or vomiting Anesthetic complications: no     Last Vitals:  Vitals:   10/28/18 0850 10/28/18 0856  BP: (!) 103/53 (!) 106/57  Pulse: 63   Resp: 19   Temp:    SpO2: 98%     Last Pain:  Vitals:   10/28/18 0926  TempSrc:   PainSc: 0-No pain                 Martha Clan

## 2018-10-29 LAB — SURGICAL PATHOLOGY

## 2018-10-30 ENCOUNTER — Encounter: Payer: Self-pay | Admitting: Gastroenterology

## 2018-10-31 ENCOUNTER — Telehealth: Payer: Self-pay

## 2018-10-31 NOTE — Telephone Encounter (Signed)
Amber Holder Patient orders RX for discontinue oxygen Beth

## 2018-12-10 ENCOUNTER — Ambulatory Visit: Payer: Self-pay | Admitting: Nurse Practitioner

## 2018-12-18 ENCOUNTER — Encounter: Payer: Self-pay | Admitting: Nurse Practitioner

## 2019-01-14 ENCOUNTER — Other Ambulatory Visit: Payer: Self-pay

## 2019-01-14 DIAGNOSIS — E559 Vitamin D deficiency, unspecified: Secondary | ICD-10-CM

## 2019-01-14 MED ORDER — ERGOCALCIFEROL 1.25 MG (50000 UT) PO CAPS: 50000.0000 [IU] | ORAL_CAPSULE | ORAL | 0 refills | Status: DC

## 2019-01-30 ENCOUNTER — Ambulatory Visit (INDEPENDENT_AMBULATORY_CARE_PROVIDER_SITE_OTHER): Payer: Medicare Other | Admitting: Nurse Practitioner

## 2019-01-30 ENCOUNTER — Encounter: Payer: Self-pay | Admitting: Nurse Practitioner

## 2019-01-30 ENCOUNTER — Other Ambulatory Visit: Payer: Self-pay

## 2019-01-30 VITALS — BP 115/63 | HR 70 | Resp 94 | Ht 65.0 in | Wt 181.0 lb

## 2019-01-30 DIAGNOSIS — R300 Dysuria: Secondary | ICD-10-CM | POA: Diagnosis not present

## 2019-01-30 DIAGNOSIS — J449 Chronic obstructive pulmonary disease, unspecified: Secondary | ICD-10-CM

## 2019-01-30 DIAGNOSIS — I6522 Occlusion and stenosis of left carotid artery: Secondary | ICD-10-CM

## 2019-01-30 DIAGNOSIS — E1165 Type 2 diabetes mellitus with hyperglycemia: Secondary | ICD-10-CM | POA: Diagnosis not present

## 2019-01-30 LAB — POCT URINALYSIS DIPSTICK
Bilirubin, UA: NEGATIVE
Glucose, UA: NEGATIVE
Ketones, UA: NEGATIVE
Leukocytes, UA: NEGATIVE
Nitrite, UA: NEGATIVE
Protein, UA: NEGATIVE
Spec Grav, UA: 1.025 (ref 1.010–1.025)
Urobilinogen, UA: 0.2 E.U./dL
pH, UA: 5 (ref 5.0–8.0)

## 2019-01-30 LAB — POCT GLYCOSYLATED HEMOGLOBIN (HGB A1C): Hemoglobin A1C: 5.9 % — AB (ref 4.0–5.6)

## 2019-01-30 NOTE — Progress Notes (Signed)
[facility] [place], [place] [address]  Internal MEDICINE  Office Visit Note  Patient Name: Amber Holder  [id number]  [id number]  Date of Service: 02/05/2019  Chief Complaint  Patient presents with  . Medical Management of Chronic Issues  . Diabetes    The patient states that she is doing well overall. Blood sugars have been doing well. Blood pressure is well controlled. She hs history of chronic urinary tract infection. She is scheduled to see a new urologist for continued management, as her current urologist has moved out of town. She continues to take preventive antibiotics.   Diabetes  She presents for her follow-up diabetic visit. She has type 2 diabetes mellitus. No MedicAlert identification noted. Her disease course has been stable. There are no hypoglycemic associated symptoms. Pertinent negatives for hypoglycemia include no dizziness, headaches or nervousness/anxiousness. There are no diabetic associated symptoms. Pertinent negatives for diabetes include no chest pain, no fatigue, no polydipsia and no polyuria. Hypoglycemia complications include nocturnal hypoglycemia. Symptoms are stable. Diabetic complications include nephropathy. Risk factors for coronary artery disease include diabetes mellitus, dyslipidemia, hypertension, stress and post-menopausal. Current diabetic treatment includes oral agent (monotherapy). She is compliant with treatment all of the time. Her weight is stable. She is following a generally healthy diet. Meal planning includes avoidance of concentrated sweets. She has not had a previous visit with a dietitian. She participates in exercise intermittently. There is no change in her home blood glucose trend. An ACE inhibitor/angiotensin II receptor blocker is not being taken. She does not see a podiatrist.Eye exam is current.       Current Medication: Outpatient Encounter Medications as of 01/30/2019  Medication Sig Note  .  acetaminophen (TYLENOL) 500 MG chewable tablet Chew 1,000 mg by mouth every 8 (eight) hours as needed for pain.   Marland Kitchen albuterol (PROVENTIL HFA;VENTOLIN HFA) 108 (90 Base) MCG/ACT inhaler Inhale 2 puffs into the lungs every 6 (six) hours as needed for wheezing or shortness of breath.   . Carboxymethylcell-Hypromellose (GENTEAL) 0.25-0.3 % GEL Apply 1 drop to eye as needed.   . cholecalciferol (VITAMIN D) 400 UNITS TABS tablet Take 2,000 Units by mouth daily.    Marland Kitchen enoxaparin (LOVENOX) 40 MG/0.4ML injection Inject 0.4 mLs (40 mg total) into the skin daily.   . ergocalciferol (DRISDOL) 1.25 MG (50000 UT) capsule Take 1 capsule (50,000 Units total) by mouth once a week.   . furosemide (LASIX) 20 MG tablet TAKE 1 TABLET BY MOUTH DAILY   . glucose blood test strip Blood sugar testing once daily . dx E11.65   . ibandronate (BONIVA) 150 MG tablet Take 1 tablet (150 mg total) by mouth every 30 (thirty) days.   . Magnesium 250 MG TABS Take by mouth daily.   . meloxicam (MOBIC) 7.5 MG tablet Take 1 tablet (7.5 mg total) by mouth as needed for pain.   . metFORMIN (GLUCOPHAGE) 500 MG tablet Take 0.5 tablets (250 mg total) by mouth daily with breakfast.   . montelukast (SINGULAIR) 10 MG tablet TAKE 1 TABLET BY MOUTH DAILY FOR COUGH   . nitrofurantoin, macrocrystal-monohydrate, (MACROBID) 100 MG capsule Take 1 capsule (100 mg total) by mouth daily.   Marland Kitchen omega-3 acid ethyl esters (LOVAZA) 1 G capsule Take by mouth daily. 05/31/2016: Not taking  . pneumococcal 23 valent vaccine (PNEUMOVAX 23) 25 MCG/0.5ML injection Inject 0.36ml IM once   . potassium chloride (KLOR-CON) 8 MEQ tablet Take 8 mEq by mouth as needed (only take when taking  Lasix).    . tapentadol (NUCYNTA) 50 MG tablet Take 1-2 tablets (50-100 mg total) by mouth every 4 (four) hours as needed for moderate pain.   Marland Kitchen tiotropium (SPIRIVA) 18 MCG inhalation capsule Place 1 capsule (18 mcg total) into inhaler and inhale daily.   Marland Kitchen amoxicillin (AMOXIL) 875 MG  tablet Take 1 tablet (875 mg total) by mouth 2 (two) times daily. (Patient not taking: Reported on 01/30/2019)    No facility-administered encounter medications on file as of 01/30/2019.     Surgical History: Past Surgical History:  Procedure Laterality Date  . APPENDECTOMY    . cataract surgery Bilateral   . CHOLECYSTECTOMY    . COLONOSCOPY    . COLONOSCOPY WITH PROPOFOL N/A 05/28/2015   Procedure: COLONOSCOPY WITH PROPOFOL;  Surgeon: Lollie Sails, MD;  Location: [facility] ENDOSCOPY;  Service: Endoscopy;  Laterality: N/A;  . COLONOSCOPY WITH PROPOFOL N/A 10/28/2018   Procedure: COLONOSCOPY WITH PROPOFOL;  Surgeon: Lollie Sails, MD;  Location: [facility] ENDOSCOPY;  Service: Endoscopy;  Laterality: N/A;  . EYE SURGERY Bilateral 2015   Cataract Extraction with IOL  . LITHOTRIPSY  2015   has had many stones  . Mastoidotomy  1970  . ROTATOR CUFF REPAIR Right 2011  . TOTAL HIP ARTHROPLASTY Right 06/14/2016   Procedure: TOTAL HIP ARTHROPLASTY;  Surgeon: Dereck Leep, MD;  Location: [facility] ORS;  Service: Orthopedics;  Laterality: Right;    Medical History: Past Medical History:  Diagnosis Date  . Anxiety 01/02/2012  . Arthritis   . Asthma   . Chronic kidney disease    Kidney stones  . Colon polyps   . COPD (chronic obstructive pulmonary disease) ([place])   . Diabetes mellitus without complication ([place])   . GERD (gastroesophageal reflux disease)   . HOH (hard of hearing)    Bilateral hearing aids  . Osteoporosis   . Sleep apnea    Does not use C-PAP on a regular basis  . Venous stasis     Family History: Family History  Problem Relation Age of Onset  . Breast cancer Maternal Aunt 71  . Bladder Cancer Neg Hx   . Kidney cancer Neg Hx     Social History   Socioeconomic History  . Marital status: Divorced    Spouse name: Not on file  . Number of children: Not on file  . Years of education: Not on file  . Highest education level: Not on file  Occupational History  . Not on file   Social Needs  . Financial resource strain: Not on file  . Food insecurity:    Worry: Not on file    Inability: Not on file  . Transportation needs:    Medical: Not on file    Non-medical: Not on file  Tobacco Use  . Smoking status: Current Every Day Smoker    Packs/day: 1.00    Types: Cigarettes  . Smokeless tobacco: Never Used  Substance and Sexual Activity  . Alcohol use: Yes    Comment: occassional  . Drug use: No  . Sexual activity: Never  Lifestyle  . Physical activity:    Days per week: Not on file    Minutes per session: Not on file  . Stress: Not on file  Relationships  . Social connections:    Talks on phone: Not on file    Gets together: Not on file    Attends religious service: Not on file    Active member of club or organization: Not  on file    Attends meetings of clubs or organizations: Not on file    Relationship status: Not on file  . Intimate partner violence:    Fear of current or ex partner: Not on file    Emotionally abused: Not on file    Physically abused: Not on file    Forced sexual activity: Not on file  Other Topics Concern  . Not on file  Social History Narrative  . Not on file      Review of Systems  Constitutional: Negative for activity change, chills, fatigue and unexpected weight change.  HENT: Negative for congestion, nosebleeds, postnasal drip, rhinorrhea and voice change.   Respiratory: Negative for cough, chest tightness, shortness of breath and wheezing.   Cardiovascular: Negative for chest pain and palpitations.  Gastrointestinal: Negative for constipation, diarrhea, nausea and vomiting.  Endocrine: Negative for cold intolerance, heat intolerance, polydipsia and polyuria.       Blood sugars doing well   Genitourinary: Negative for difficulty urinating, dysuria, flank pain, frequency and urgency.  Musculoskeletal: Positive for back pain.  Skin: Negative for rash.  Allergic/Immunologic: Positive for environmental allergies.   Neurological: Negative for dizziness and headaches.  Hematological: Negative for adenopathy.  Psychiatric/Behavioral: Negative for behavioral problems, dysphoric mood and sleep disturbance. The patient is not nervous/anxious.     Today's Vitals   01/30/19 1051  BP: 115/63  Pulse: 70  Resp: (!) 94  Weight: 181 lb (82.1 kg)  Height: 5\' 5"  (1.651 m)   Body mass index is 30.12 kg/m.  Physical Exam Vitals signs and nursing note reviewed.  Constitutional:      General: She is not in acute distress.    Appearance: Normal appearance. She is well-developed. She is not diaphoretic.  HENT:     Head: Normocephalic and atraumatic.     Mouth/Throat:     Pharynx: No oropharyngeal exudate.  Eyes:     Pupils: Pupils are equal, round, and reactive to light.  Neck:     Musculoskeletal: Normal range of motion and neck supple.     Thyroid: No thyromegaly.     Vascular: No JVD.     Trachea: No tracheal deviation.  Cardiovascular:     Rate and Rhythm: Normal rate and regular rhythm.     Heart sounds: Normal heart sounds. No murmur. No friction rub. No gallop.   Pulmonary:     Effort: Pulmonary effort is normal. No respiratory distress.     Breath sounds: Normal breath sounds. No wheezing or rales.  Chest:     Chest wall: No tenderness.  Abdominal:     General: Bowel sounds are normal.     Palpations: Abdomen is soft.     Tenderness: There is no abdominal tenderness.  Genitourinary:    Comments: Urine sample positive for trace blood.  Musculoskeletal: Normal range of motion.  Lymphadenopathy:     Cervical: No cervical adenopathy.  Skin:    General: Skin is warm and dry.  Neurological:     Mental Status: She is alert and oriented to person, place, and time.     Cranial Nerves: No cranial nerve deficit.  Psychiatric:        Behavior: Behavior normal.        Thought Content: Thought content normal.        Judgment: Judgment normal.    Assessment/Plan:   1. Uncontrolled type 2  diabetes mellitus with hyperglycemia (HCC) - POCT HgB A1C 5.8 today. Continue diabetic medication as prescribed  2. Dysuria Trace blood present in urine sample. Will send for culture and sensitivity and treat as indicated. Patient should keep upcoming appointment with urology.  - POCT Urinalysis Dipstick - CULTURE, URINE COMPREHENSIVE  3. Stenosis of left carotid artery Will get new carotid doppler study for further evaluation.  - US Carotid Duplex Bilateral; Future  4. Chronic obstructive pulmonary disease, unspecified COPD type ([place]) Stable. Continue all inhalers as prescribed   General Counseling: Jalen verbalizes understanding of the findings of todays visit and agrees with plan of treatment. I have discussed any further diagnostic evaluation that may be needed or ordered today. We also reviewed her medications today. she has been encouraged to call the office with any questions or concerns that should arise related to todays visit.  Diabetes Counseling:  1. Addition of ACE inh/ ARB'S for nephroprotection. Microalbumin is updated  2. Diabetic foot care, prevention of complications. Podiatry consult 3. Exercise and lose weight.  4. Diabetic eye examination, Diabetic eye exam is updated  5. Monitor blood sugar closlely. nutrition counseling.  6. Sign and symptoms of hypoglycemia including shaking sweating,confusion and headaches.  This patient was seen by Leretha Pol FNP Collaboration with Dr Lavera Guise as a part of collaborative care agreement  Orders Placed This Encounter  Procedures  . CULTURE, URINE COMPREHENSIVE  . US Carotid Duplex Bilateral  . POCT HgB A1C  . POCT Urinalysis Dipstick     Time spent: 25 Minutes      Dr Lavera Guise Internal medicine

## 2019-02-02 LAB — CULTURE, URINE COMPREHENSIVE

## 2019-02-05 DIAGNOSIS — I6522 Occlusion and stenosis of left carotid artery: Secondary | ICD-10-CM | POA: Insufficient documentation

## 2019-02-21 ENCOUNTER — Ambulatory Visit (INDEPENDENT_AMBULATORY_CARE_PROVIDER_SITE_OTHER): Payer: Medicare Other

## 2019-02-21 ENCOUNTER — Other Ambulatory Visit: Payer: Self-pay

## 2019-02-21 DIAGNOSIS — I6522 Occlusion and stenosis of left carotid artery: Secondary | ICD-10-CM

## 2019-02-21 DIAGNOSIS — I6523 Occlusion and stenosis of bilateral carotid arteries: Secondary | ICD-10-CM

## 2019-02-24 ENCOUNTER — Other Ambulatory Visit: Payer: Self-pay

## 2019-02-24 ENCOUNTER — Ambulatory Visit (INDEPENDENT_AMBULATORY_CARE_PROVIDER_SITE_OTHER): Payer: Medicare Other | Admitting: Internal Medicine

## 2019-02-24 ENCOUNTER — Encounter: Payer: Self-pay | Admitting: Internal Medicine

## 2019-02-24 VITALS — Ht 66.0 in | Wt 178.0 lb

## 2019-02-24 DIAGNOSIS — J440 Chronic obstructive pulmonary disease with acute lower respiratory infection: Secondary | ICD-10-CM | POA: Diagnosis not present

## 2019-02-24 DIAGNOSIS — I6522 Occlusion and stenosis of left carotid artery: Secondary | ICD-10-CM | POA: Diagnosis not present

## 2019-02-24 DIAGNOSIS — R091 Pleurisy: Secondary | ICD-10-CM

## 2019-02-24 DIAGNOSIS — J209 Acute bronchitis, unspecified: Secondary | ICD-10-CM

## 2019-02-24 DIAGNOSIS — J302 Other seasonal allergic rhinitis: Secondary | ICD-10-CM | POA: Diagnosis not present

## 2019-02-24 MED ORDER — AZITHROMYCIN 250 MG PO TABS: 250.0000 mg | ORAL_TABLET | Freq: Every day | ORAL | 0 refills | Status: AC

## 2019-02-24 MED ORDER — MELOXICAM 7.5 MG PO TABS: 7.5000 mg | ORAL_TABLET | Freq: Two times a day (BID) | ORAL | 1 refills | Status: DC

## 2019-02-24 MED ORDER — OMEPRAZOLE 40 MG PO CPDR: 40.0000 mg | DELAYED_RELEASE_CAPSULE | Freq: Every day | ORAL | 3 refills | Status: DC

## 2019-02-24 NOTE — Telephone Encounter (Signed)
As per dr Humphrey Rolls advised pt we send omeprazole,meloxicam and zithromax to her phar

## 2019-02-24 NOTE — Progress Notes (Signed)
[facility] [place], [place] [address]  Internal MEDICINE  Telephone Visit  Patient Name: Amber Holder  [id number]  [id number]  Date of Service: 02/24/2019  I connected with the patient at 1145 by telephone and verified the patients identity using two identifiers.   I discussed the limitations, risks, security and privacy concerns of performing an evaluation and management service by telephone and the availability of in person appointments. I also discussed with the patient that there may be a patient responsible charge related to the service.  The patient expressed understanding and agrees to proceed.    Chief Complaint  Patient presents with  . Telephone Assessment  . Telephone Screen  . Cough    clear mucus   . Shortness of Breath  . Allergies  . Medical Management of Chronic Issues    muscle pull left shoulder pain    HPI Pt is connected via webcam to prevent the risk of pandemic of Covid -19. She is c/o cough and sob, she is also c/o pain on the left side of chest and shoulder ith breathing, pain is reproducible. She thinks it feels like a pulled muscle, denies any fever or chills, post  nasal drip is present    Current Medication: Outpatient Encounter Medications as of 02/24/2019  Medication Sig Note  . acetaminophen (TYLENOL) 500 MG chewable tablet Chew 1,000 mg by mouth every 8 (eight) hours as needed for pain.   Marland Kitchen albuterol (PROVENTIL HFA;VENTOLIN HFA) 108 (90 Base) MCG/ACT inhaler Inhale 2 puffs into the lungs every 6 (six) hours as needed for wheezing or shortness of breath.   . Carboxymethylcell-Hypromellose (GENTEAL) 0.25-0.3 % GEL Apply 1 drop to eye as needed.   . cholecalciferol (VITAMIN D) 400 UNITS TABS tablet Take 2,000 Units by mouth daily.    Marland Kitchen enoxaparin (LOVENOX) 40 MG/0.4ML injection Inject 0.4 mLs (40 mg total) into the skin daily.   . ergocalciferol (DRISDOL) 1.25 MG (50000 UT) capsule Take 1 capsule (50,000 Units total) by mouth  once a week.   . furosemide (LASIX) 20 MG tablet TAKE 1 TABLET BY MOUTH DAILY   . glucose blood test strip Blood sugar testing once daily . dx E11.65   . ibandronate (BONIVA) 150 MG tablet Take 1 tablet (150 mg total) by mouth every 30 (thirty) days.   . Magnesium 250 MG TABS Take by mouth daily.   . meloxicam (MOBIC) 7.5 MG tablet Take 1 tablet (7.5 mg total) by mouth as needed for pain.   . metFORMIN (GLUCOPHAGE) 500 MG tablet Take 0.5 tablets (250 mg total) by mouth daily with breakfast.   . montelukast (SINGULAIR) 10 MG tablet TAKE 1 TABLET BY MOUTH DAILY FOR COUGH   . nitrofurantoin, macrocrystal-monohydrate, (MACROBID) 100 MG capsule Take 1 capsule (100 mg total) by mouth daily.   . potassium chloride (KLOR-CON) 8 MEQ tablet Take 8 mEq by mouth as needed (only take when taking Lasix).    . tapentadol (NUCYNTA) 50 MG tablet Take 1-2 tablets (50-100 mg total) by mouth every 4 (four) hours as needed for moderate pain.   Marland Kitchen tiotropium (SPIRIVA) 18 MCG inhalation capsule Place 1 capsule (18 mcg total) into inhaler and inhale daily.   Marland Kitchen amoxicillin (AMOXIL) 875 MG tablet Take 1 tablet (875 mg total) by mouth 2 (two) times daily. (Patient not taking: Reported on 01/30/2019)   . pneumococcal 23 valent vaccine (PNEUMOVAX 23) 25 MCG/0.5ML injection Inject 0.3ml IM once   . [DISCONTINUED] omega-3 acid ethyl esters (  LOVAZA) 1 G capsule Take by mouth daily. 05/31/2016: Not taking   No facility-administered encounter medications on file as of 02/24/2019.     Surgical History: Past Surgical History:  Procedure Laterality Date  . APPENDECTOMY    . cataract surgery Bilateral   . CHOLECYSTECTOMY    . COLONOSCOPY    . COLONOSCOPY WITH PROPOFOL N/A 05/28/2015   Procedure: COLONOSCOPY WITH PROPOFOL;  Surgeon: Lollie Sails, MD;  Location: [facility] ENDOSCOPY;  Service: Endoscopy;  Laterality: N/A;  . COLONOSCOPY WITH PROPOFOL N/A 10/28/2018   Procedure: COLONOSCOPY WITH PROPOFOL;  Surgeon: Lollie Sails,  MD;  Location: [facility] ENDOSCOPY;  Service: Endoscopy;  Laterality: N/A;  . EYE SURGERY Bilateral 2015   Cataract Extraction with IOL  . LITHOTRIPSY  2015   has had many stones  . Mastoidotomy  1970  . ROTATOR CUFF REPAIR Right 2011  . TOTAL HIP ARTHROPLASTY Right 06/14/2016   Procedure: TOTAL HIP ARTHROPLASTY;  Surgeon: Dereck Leep, MD;  Location: [facility] ORS;  Service: Orthopedics;  Laterality: Right;    Medical History: Past Medical History:  Diagnosis Date  . Anxiety 01/02/2012  . Arthritis   . Asthma   . Chronic kidney disease    Kidney stones  . Colon polyps   . COPD (chronic obstructive pulmonary disease) ([place])   . Diabetes mellitus without complication ([place])   . GERD (gastroesophageal reflux disease)   . HOH (hard of hearing)    Bilateral hearing aids  . Osteoporosis   . Sleep apnea    Does not use C-PAP on a regular basis  . Venous stasis     Family History: Family History  Problem Relation Age of Onset  . Breast cancer Maternal Aunt 73  . Bladder Cancer Neg Hx   . Kidney cancer Neg Hx     Social History   Socioeconomic History  . Marital status: Divorced    Spouse name: Not on file  . Number of children: Not on file  . Years of education: Not on file  . Highest education level: Not on file  Occupational History  . Not on file  Social Needs  . Financial resource strain: Not on file  . Food insecurity    Worry: Not on file    Inability: Not on file  . Transportation needs    Medical: Not on file    Non-medical: Not on file  Tobacco Use  . Smoking status: Current Every Day Smoker    Packs/day: 1.00    Types: Cigarettes  . Smokeless tobacco: Never Used  Substance and Sexual Activity  . Alcohol use: Yes    Comment: occassional  . Drug use: No  . Sexual activity: Never  Lifestyle  . Physical activity    Days per week: Not on file    Minutes per session: Not on file  . Stress: Not on file  Relationships  . Social Herbalist on phone:  Not on file    Gets together: Not on file    Attends religious service: Not on file    Active member of club or organization: Not on file    Attends meetings of clubs or organizations: Not on file    Relationship status: Not on file  . Intimate partner violence    Fear of current or ex partner: Not on file    Emotionally abused: Not on file    Physically abused: Not on file    Forced sexual activity: Not on  file  Other Topics Concern  . Not on file  Social History Narrative  . Not on file    Review of Systems  Constitutional: Positive for fatigue and fever. Negative for chills.  HENT: Positive for postnasal drip and rhinorrhea.   Respiratory: Positive for cough, shortness of breath and wheezing.   Cardiovascular: Negative.   Endocrine: Negative.   Allergic/Immunologic: Positive for environmental allergies.  Neurological: Positive for dizziness.  Psychiatric/Behavioral: Negative.     Vital Signs: Ht 5\' 6"  (1.676 m)   Wt 178 lb (80.7 kg)   BMI 28.73 kg/m    Observation/Objective: Pt is sitting comfortably on the sofa, able to communicate, pleasant to talk to, NAD, pain is reproducible   Assessment/Plan: 1. Acute bronchitis with COPD ([place]) Continue on all inhalers, Zpak is called in   2. Pleurisy Mobic 7.5 mg po bid x 3 days   3. Seasonal allergic rhinitis, unspecified trigger Claritin and Flonase OTC   General Counseling: Juliane verbalizes understanding of the findings of today's phone visit and agrees with plan of treatment. I have discussed any further diagnostic evaluation that may be needed or ordered today. We also reviewed her medications today. she has been encouraged to call the office with any questions or concerns that should arise related to todays visit.  Time spent:15 Minute  Dr Lavera Guise Internal medicine

## 2019-03-03 ENCOUNTER — Telehealth: Payer: Self-pay

## 2019-03-03 DIAGNOSIS — L578 Other skin changes due to chronic exposure to nonionizing radiation: Secondary | ICD-10-CM | POA: Diagnosis not present

## 2019-03-03 DIAGNOSIS — Z872 Personal history of diseases of the skin and subcutaneous tissue: Secondary | ICD-10-CM | POA: Diagnosis not present

## 2019-03-03 DIAGNOSIS — L853 Xerosis cutis: Secondary | ICD-10-CM | POA: Diagnosis not present

## 2019-03-03 DIAGNOSIS — Z86018 Personal history of other benign neoplasm: Secondary | ICD-10-CM | POA: Diagnosis not present

## 2019-03-03 NOTE — Telephone Encounter (Signed)
Pt called to update on how she is feeling. She said she is feeling much better with only a little pain with cough.  Pt was concerned with the side effects of omeprazole and azithromycin making magnesium levels low, which she had an issue with before.  Spoke with Dr. Clayborn Bigness and she said that since medications are short term then there should not be an issue.   Spoke with pt and advised pt of the above. Advised pt if she is not feeling completely better then she should give [place] a call back.

## 2019-03-06 ENCOUNTER — Ambulatory Visit: Payer: Medicare Other | Admitting: Urology

## 2019-03-06 ENCOUNTER — Other Ambulatory Visit: Payer: Self-pay | Admitting: Radiology

## 2019-03-06 DIAGNOSIS — N200 Calculus of kidney: Secondary | ICD-10-CM

## 2019-03-10 ENCOUNTER — Other Ambulatory Visit: Payer: Self-pay

## 2019-03-10 ENCOUNTER — Telehealth: Payer: Self-pay | Admitting: *Deleted

## 2019-03-10 ENCOUNTER — Telehealth: Payer: Self-pay

## 2019-03-10 ENCOUNTER — Ambulatory Visit: Payer: Self-pay | Admitting: Urology

## 2019-03-10 DIAGNOSIS — R6889 Other general symptoms and signs: Secondary | ICD-10-CM | POA: Diagnosis not present

## 2019-03-10 DIAGNOSIS — Z20822 Contact with and (suspected) exposure to covid-19: Secondary | ICD-10-CM

## 2019-03-10 NOTE — Addendum Note (Signed)
Addended by: Torrie Mayers on: 03/10/2019 10:23 AM   Modules accepted: Orders

## 2019-03-10 NOTE — Telephone Encounter (Signed)
Pt scheduled for covid testing today @ 12 @ [facility]. Instructions given and order placed.

## 2019-03-10 NOTE — Telephone Encounter (Signed)
Pt called she was exposure to covid as per dr Humphrey Rolls send pt for test

## 2019-03-10 NOTE — Telephone Encounter (Signed)
-----   Message from Corlis Hove sent at 03/10/2019  9:52 AM EDT ----- Please called  pt and schedule for covid 19 due to exposure for covid please [phone]

## 2019-03-11 LAB — NOVEL CORONAVIRUS, NAA: SARS-CoV-2, NAA: NOT DETECTED

## 2019-03-16 NOTE — Procedures (Signed)
[place], [place] [address]  DATE OF SERVICE: February 21, 2019  CAROTID DOPPLER INTERPRETATION:  Bilateral Carotid Ultrsasound and Color Doppler Examination was performed. The RIGHT CCA shows no significant plaque in the vessel. The LEFT CCA shows mild plaque in the vessel. There was no significant intimal thickening noted in the RIGHT carotid artery. There was no significant intimal thickening in the LEFT carotid artery.  The RIGHT CCA shows peak systolic velocity of 70 cm per second. The end diastolic velocity is 19 cm per second on the RIGHT side. The RIGHT ICA shows peak systolic velocity of 202 per second. RIGHT sided ICA end diastolic velocity is 18 cm per second. The RIGHT ECA shows a peak systolic velocity of 91 cm per second. The ICA/CCA ratio is calculated to be 1.23. This suggests less than 50% stenosis. The Vertebral Artery shows antegrade flow.  The LEFT CCA shows peak systolic velocity of 88 cm per second. The end diastolic velocity is 22 cm per second on the LEFT side. The LEFT ICA shows peak systolic velocity of 87 per second. LEFT sided ICA end diastolic velocity is 29 cm per second. The LEFT ECA shows a peak systolic velocity of 98 cm per second. The ICA/CCA ratio is calculated to be 0.79. This suggests less than 50% stenosis. The Vertebral Artery shows antegrade flow.   Impression:    The RIGHT CAROTID shows no significant stenosis. The LEFT CAROTID shows less than 50% stenosis.  There is mild plaque formation noted on the LEFT and no significant plaque on the RIGHT  side. Consider a repeat Carotid doppler if clinical situation and symptoms warrant in 6-12 months. Patient should be encouraged to change lifestyles such as smoking cessation, regular exercise and dietary modification. Use of statins in the right clinical setting and ASA is encouraged.  Allyne Gee, MD [facility] Pulmonary Critical Care Medicine

## 2019-03-24 ENCOUNTER — Encounter: Payer: Self-pay | Admitting: Internal Medicine

## 2019-03-24 ENCOUNTER — Other Ambulatory Visit: Payer: Self-pay

## 2019-03-24 ENCOUNTER — Ambulatory Visit (INDEPENDENT_AMBULATORY_CARE_PROVIDER_SITE_OTHER): Payer: Medicare Other | Admitting: Internal Medicine

## 2019-03-24 ENCOUNTER — Ambulatory Visit: Payer: Self-pay | Admitting: Nurse Practitioner

## 2019-03-24 VITALS — BP 124/60 | HR 72 | Resp 16 | Ht 66.0 in | Wt 181.0 lb

## 2019-03-24 DIAGNOSIS — J449 Chronic obstructive pulmonary disease, unspecified: Secondary | ICD-10-CM | POA: Diagnosis not present

## 2019-03-24 DIAGNOSIS — R0602 Shortness of breath: Secondary | ICD-10-CM | POA: Diagnosis not present

## 2019-03-24 DIAGNOSIS — J302 Other seasonal allergic rhinitis: Secondary | ICD-10-CM

## 2019-03-24 DIAGNOSIS — G4733 Obstructive sleep apnea (adult) (pediatric): Secondary | ICD-10-CM | POA: Diagnosis not present

## 2019-03-24 DIAGNOSIS — Z9989 Dependence on other enabling machines and devices: Secondary | ICD-10-CM | POA: Diagnosis not present

## 2019-03-24 NOTE — Progress Notes (Signed)
[facility] [place], [place] [address]  Pulmonary Sleep Medicine   Office Visit Note  Patient Name: Amber Holder DOB: 09/19/1945 MRN [id number]  Date of Service: 03/24/2019  Complaints/HPI: Pt is here for pulmonary follow up.  Pt reports she recently was treated for bronchitis.  She completed her zpak and feels like her cough is much better.  She continues to have some intermittent chest wall pain, but it is much improved with meloxicam. Pt has cpap   ROS  General: (-) fever, (-) chills, (-) night sweats, (-) weakness Skin: (-) rashes, (-) itching,. Eyes: (-) visual changes, (-) redness, (-) itching. Nose and Sinuses: (-) nasal stuffiness or itchiness, (-) postnasal drip, (-) nosebleeds, (-) sinus trouble. Mouth and Throat: (-) sore throat, (-) hoarseness. Neck: (-) swollen glands, (-) enlarged thyroid, (-) neck pain. Respiratory: + cough, (-) bloody sputum, - shortness of breath, - wheezing. Cardiovascular: - ankle swelling, (-) chest pain. Lymphatic: (-) lymph node enlargement. Neurologic: (-) numbness, (-) tingling. Psychiatric: (-) anxiety, (-) depression   Current Medication: Outpatient Encounter Medications as of 03/24/2019  Medication Sig  . acetaminophen (TYLENOL) 500 MG chewable tablet Chew 1,000 mg by mouth every 8 (eight) hours as needed for pain.  Marland Kitchen albuterol (PROVENTIL HFA;VENTOLIN HFA) 108 (90 Base) MCG/ACT inhaler Inhale 2 puffs into the lungs every 6 (six) hours as needed for wheezing or shortness of breath.  . Carboxymethylcell-Hypromellose (GENTEAL) 0.25-0.3 % GEL Apply 1 drop to eye as needed.  . cholecalciferol (VITAMIN D) 400 UNITS TABS tablet Take 2,000 Units by mouth daily.   Marland Kitchen enoxaparin (LOVENOX) 40 MG/0.4ML injection Inject 0.4 mLs (40 mg total) into the skin daily.  . ergocalciferol (DRISDOL) 1.25 MG (50000 UT) capsule Take 1 capsule (50,000 Units total) by mouth once a week.  . furosemide (LASIX) 20 MG tablet TAKE 1 TABLET  BY MOUTH DAILY  . glucose blood test strip Blood sugar testing once daily . dx E11.65  . ibandronate (BONIVA) 150 MG tablet Take 1 tablet (150 mg total) by mouth every 30 (thirty) days.  . Magnesium 250 MG TABS Take by mouth daily.  . meloxicam (MOBIC) 7.5 MG tablet Take 1 tablet (7.5 mg total) by mouth 2 (two) times a day.  . metFORMIN (GLUCOPHAGE) 500 MG tablet Take 0.5 tablets (250 mg total) by mouth daily with breakfast.  . montelukast (SINGULAIR) 10 MG tablet TAKE 1 TABLET BY MOUTH DAILY FOR COUGH  . nitrofurantoin, macrocrystal-monohydrate, (MACROBID) 100 MG capsule Take 1 capsule (100 mg total) by mouth daily.  Marland Kitchen omeprazole (PRILOSEC) 40 MG capsule Take 1 capsule (40 mg total) by mouth daily.  . pneumococcal 23 valent vaccine (PNEUMOVAX 23) 25 MCG/0.5ML injection Inject 0.62ml IM once  . potassium chloride (KLOR-CON) 8 MEQ tablet Take 8 mEq by mouth as needed (only take when taking Lasix).   . tapentadol (NUCYNTA) 50 MG tablet Take 1-2 tablets (50-100 mg total) by mouth every 4 (four) hours as needed for moderate pain.  Marland Kitchen tiotropium (SPIRIVA) 18 MCG inhalation capsule Place 1 capsule (18 mcg total) into inhaler and inhale daily.  . [DISCONTINUED] amoxicillin (AMOXIL) 875 MG tablet Take 1 tablet (875 mg total) by mouth 2 (two) times daily. (Patient not taking: Reported on 01/30/2019)   No facility-administered encounter medications on file as of 03/24/2019.     Surgical History: Past Surgical History:  Procedure Laterality Date  . APPENDECTOMY    . cataract surgery Bilateral   . CHOLECYSTECTOMY    . COLONOSCOPY    .  COLONOSCOPY WITH PROPOFOL N/A 05/28/2015   Procedure: COLONOSCOPY WITH PROPOFOL;  Surgeon: Lollie Sails, MD;  Location: [facility] ENDOSCOPY;  Service: Endoscopy;  Laterality: N/A;  . COLONOSCOPY WITH PROPOFOL N/A 10/28/2018   Procedure: COLONOSCOPY WITH PROPOFOL;  Surgeon: Lollie Sails, MD;  Location: [facility] ENDOSCOPY;  Service: Endoscopy;  Laterality: N/A;  . EYE SURGERY  Bilateral 2015   Cataract Extraction with IOL  . LITHOTRIPSY  2015   has had many stones  . Mastoidotomy  1970  . ROTATOR CUFF REPAIR Right 2011  . TOTAL HIP ARTHROPLASTY Right 06/14/2016   Procedure: TOTAL HIP ARTHROPLASTY;  Surgeon: Dereck Leep, MD;  Location: [facility] ORS;  Service: Orthopedics;  Laterality: Right;    Medical History: Past Medical History:  Diagnosis Date  . Anxiety 01/02/2012  . Arthritis   . Asthma   . Chronic kidney disease    Kidney stones  . Colon polyps   . COPD (chronic obstructive pulmonary disease) ([place])   . Diabetes mellitus without complication ([place])   . GERD (gastroesophageal reflux disease)   . HOH (hard of hearing)    Bilateral hearing aids  . Osteoporosis   . Sleep apnea    Does not use C-PAP on a regular basis  . Venous stasis     Family History: Family History  Problem Relation Age of Onset  . Breast cancer Maternal Aunt 25  . Bladder Cancer Neg Hx   . Kidney cancer Neg Hx     Social History: Social History   Socioeconomic History  . Marital status: Divorced    Spouse name: Not on file  . Number of children: Not on file  . Years of education: Not on file  . Highest education level: Not on file  Occupational History  . Not on file  Social Needs  . Financial resource strain: Not on file  . Food insecurity    Worry: Not on file    Inability: Not on file  . Transportation needs    Medical: Not on file    Non-medical: Not on file  Tobacco Use  . Smoking status: Current Every Day Smoker    Packs/day: 1.00    Types: Cigarettes  . Smokeless tobacco: Never Used  Substance and Sexual Activity  . Alcohol use: Yes    Comment: occassional  . Drug use: No  . Sexual activity: Never  Lifestyle  . Physical activity    Days per week: Not on file    Minutes per session: Not on file  . Stress: Not on file  Relationships  . Social Herbalist on phone: Not on file    Gets together: Not on file    Attends religious  service: Not on file    Active member of club or organization: Not on file    Attends meetings of clubs or organizations: Not on file    Relationship status: Not on file  . Intimate partner violence    Fear of current or ex partner: Not on file    Emotionally abused: Not on file    Physically abused: Not on file    Forced sexual activity: Not on file  Other Topics Concern  . Not on file  Social History Narrative  . Not on file    Vital Signs: Blood pressure 124/60, pulse 72, resp. rate 16, height 5\' 6"  (1.676 m), weight 181 lb (82.1 kg), SpO2 95 %.  Examination: General Appearance: The patient is well-developed, well-nourished, and in  no distress. Skin: Gross inspection of skin unremarkable. Head: normocephalic, no gross deformities. Eyes: no gross deformities noted. ENT: ears appear grossly normal no exudates. Neck: Supple. No thyromegaly. No LAD. Respiratory: clear bilateraly. Cardiovascular: Normal S1 and S2 without murmur or rub. Extremities: No cyanosis. pulses are equal. Neurologic: Alert and oriented. No involuntary movements.  LABS: Recent Results (from the past 2160 hour(s))  CULTURE, URINE COMPREHENSIVE     Status: None   Collection Time: 01/30/19 10:30 AM   Specimen: Urine   URINE  Result Value Ref Range   Urine Culture, Comprehensive Final report    Organism ID, Bacteria Comment     Comment: Mixed urogenital flora 3,000 Colonies/mL   POCT Urinalysis Dipstick     Status: None   Collection Time: 01/30/19 11:01 AM  Result Value Ref Range   Color, UA     Clarity, UA     Glucose, UA Negative Negative   Bilirubin, UA negative    Ketones, UA negative    Spec Grav, UA 1.025 1.010 - 1.025   Blood, UA trace    pH, UA 5.0 5.0 - 8.0   Protein, UA Negative Negative   Urobilinogen, UA 0.2 0.2 or 1.0 E.U./dL   Nitrite, UA negative    Leukocytes, UA Negative Negative   Appearance     Odor    POCT HgB A1C     Status: Abnormal   Collection Time: 01/30/19 11:02 AM   Result Value Ref Range   Hemoglobin A1C 5.9 (A) 4.0 - 5.6 %   HbA1c POC (<> result, manual entry)     HbA1c, POC (prediabetic range)     HbA1c, POC (controlled diabetic range)    Novel Coronavirus, NAA ([facility])     Status: None   Collection Time: 03/10/19 12:00 AM  Result Value Ref Range   SARS-CoV-2, NAA Not Detected Not Detected    Comment: This test was developed and its performance characteristics determined by [employer]. This test has not been FDA cleared or approved. This test has been authorized by FDA under an Emergency Use Authorization (EUA). This test is only authorized for the duration of time the declaration that circumstances exist justifying the authorization of the emergency use of in vitro diagnostic tests for detection of SARS-CoV-2 virus and/or diagnosis of COVID-19 infection under section 564(b)(1) of the Act, 21 U.S.C. 263ZCH-8(I)(5), unless the authorization is terminated or revoked sooner. When diagnostic testing is negative, the possibility of a false negative result should be considered in the context of a patient's recent exposures and the presence of clinical signs and symptoms consistent with COVID-19. An individual without symptoms of COVID-19 and who is not shedding SARS-CoV-2 virus would expect to have a negative (not detected) result in this assay.     Radiology: No results found.  No results found.  No results found.    Assessment and Plan: Patient Active Problem List   Diagnosis Date Noted  . Stenosis of left carotid artery 02/05/2019  . Acute upper respiratory infection 10/09/2018  . Flu-like symptoms 10/09/2018  . Sore throat 10/09/2018  . Vitamin D deficiency 07/27/2018  . Need for vaccination against Streptococcus pneumoniae using pneumococcal conjugate vaccine 7 07/27/2018  . Encounter for general adult medical examination with abnormal findings 04/07/2018  . Primary generalized (osteo)arthritis 04/07/2018  .  Hematuria, microscopic 12/18/2017  . Renal colic 02/77/4128  . Urinary tract infection without hematuria 11/21/2017  . Dysuria 11/21/2017  . Uncontrolled type 2 diabetes mellitus with hyperglycemia ([place])  11/21/2017  . Diabetes mellitus type 2, uncomplicated ([place]) 88/75/7972  . Osteoporosis 10/04/2017  . S/P total hip arthroplasty 06/14/2016  . Inguinal hernia, left 06/09/2013  . Umbilical hernia 82/01/155  . Increased frequency of urination 11/27/2012  . Chronic cystitis 11/19/2012  . Incomplete emptying of bladder 11/19/2012  . Medullary sponge kidney 11/19/2012  . Mixed urge and stress incontinence 11/19/2012  . Anxiety 01/02/2012  . Calculus of kidney 01/02/2012  . COPD (chronic obstructive pulmonary disease) ([place]) 01/02/2012  . GERD (gastroesophageal reflux disease) 01/02/2012  . Hearing loss 01/02/2012  . Hyperlipidemia, unspecified 01/02/2012  . Obesity, unspecified 01/02/2012  . Sleep apnea 01/02/2012  . Tobacco abuse 01/02/2012  . Venous insufficiency 01/02/2012    1. OSA on CPAP Continue to use cpap as directed.   2. Chronic obstructive pulmonary disease, unspecified COPD type ([place]) Stable, continue current therapy.   3. Seasonal allergic rhinitis, unspecified trigger Controlled, continue current medications.   4. SOB (shortness of breath) - Spirometry with Graph  General Counseling: I have discussed the findings of the evaluation and examination with Robina.  I have also discussed any further diagnostic evaluation thatmay be needed or ordered today. Shia verbalizes understanding of the findings of todays visit. We also reviewed her medications today and discussed drug interactions and side effects including but not limited excessive drowsiness and altered mental states. We also discussed that there is always a risk not just to her but also people around her. she has been encouraged to call the office with any questions or concerns that should arise related to todays  visit.    Time spent: 15 This patient was seen by Orson Gear AGNP-C in Collaboration with Dr. Devona Konig as a part of collaborative care agreement.    I have personally obtained a history, examined the patient, evaluated laboratory and imaging results, formulated the assessment and plan and placed orders.    Allyne Gee, MD [facility] Pulmonary and Critical Care Sleep medicine

## 2019-03-25 DIAGNOSIS — K580 Irritable bowel syndrome with diarrhea: Secondary | ICD-10-CM | POA: Diagnosis not present

## 2019-03-25 DIAGNOSIS — R197 Diarrhea, unspecified: Secondary | ICD-10-CM | POA: Diagnosis not present

## 2019-03-25 DIAGNOSIS — Z792 Long term (current) use of antibiotics: Secondary | ICD-10-CM | POA: Diagnosis not present

## 2019-03-26 DIAGNOSIS — Z792 Long term (current) use of antibiotics: Secondary | ICD-10-CM | POA: Diagnosis not present

## 2019-03-26 DIAGNOSIS — R197 Diarrhea, unspecified: Secondary | ICD-10-CM | POA: Diagnosis not present

## 2019-03-26 DIAGNOSIS — K580 Irritable bowel syndrome with diarrhea: Secondary | ICD-10-CM | POA: Diagnosis not present

## 2019-03-27 ENCOUNTER — Ambulatory Visit: Payer: Self-pay | Admitting: Nurse Practitioner

## 2019-04-08 ENCOUNTER — Telehealth: Payer: Self-pay | Admitting: Urology

## 2019-04-08 NOTE — Telephone Encounter (Signed)
Left pt mess to call 

## 2019-04-08 NOTE — Telephone Encounter (Signed)
Pt LMOM requesting refill for Macrobid called into [facility].

## 2019-04-08 NOTE — Telephone Encounter (Signed)
Spoke with patient she is out of her suppression abx and wanted a refill. Patient states that she feels like she currently has an infection . She was added on to the PA schedule tomorrow for evaluation of possible UTI. Symptom dysuria for a few days

## 2019-04-09 ENCOUNTER — Ambulatory Visit (INDEPENDENT_AMBULATORY_CARE_PROVIDER_SITE_OTHER): Payer: Medicare Other | Admitting: Physician Assistant

## 2019-04-09 ENCOUNTER — Other Ambulatory Visit: Payer: Self-pay

## 2019-04-09 ENCOUNTER — Encounter: Payer: Self-pay | Admitting: Physician Assistant

## 2019-04-09 VITALS — BP 110/69 | HR 83 | Ht 66.0 in | Wt 180.0 lb

## 2019-04-09 DIAGNOSIS — R3915 Urgency of urination: Secondary | ICD-10-CM | POA: Diagnosis not present

## 2019-04-09 DIAGNOSIS — Z87440 Personal history of urinary (tract) infections: Secondary | ICD-10-CM | POA: Diagnosis not present

## 2019-04-09 DIAGNOSIS — N3020 Other chronic cystitis without hematuria: Secondary | ICD-10-CM

## 2019-04-09 DIAGNOSIS — N3941 Urge incontinence: Secondary | ICD-10-CM | POA: Diagnosis not present

## 2019-04-09 DIAGNOSIS — R300 Dysuria: Secondary | ICD-10-CM

## 2019-04-09 LAB — BLADDER SCAN AMB NON-IMAGING: Scan Result: 24

## 2019-04-09 MED ORDER — NITROFURANTOIN MONOHYD MACRO 100 MG PO CAPS: 100.0000 mg | ORAL_CAPSULE | Freq: Every day | ORAL | 0 refills | Status: DC

## 2019-04-09 NOTE — Progress Notes (Signed)
04/09/2019 12:23 PM   Freddie Apley 1946-02-15 [id number]  CC: Urinary urgency and leakage  HPI: Amber Holder is a 73 y.o. female who presents today for evaluation of possible UTI. She is an established [facility] patient who last saw Dr. Diamantina Providence on 09/03/2018 for nephrolithiasis and recurrent UTIs.  She does have a history of urine cultures growing Cipro-resistant E. coli as recently as March 2019. She takes nitrofurantoin 100 mg daily for prophylaxis.  She reports dysuria and urgency with breakthrough infections. She is allergic to sulfa antibiotics.  Patient states that she has 3 days left of her daily suppressive antibiotic and would like a refill at this time.  She has a 65-month follow-up appointment with Dr. Diamantina Providence on 8/26.    Additionally, she also reports a history of urinary urgency and leakage.  She states she does not believe she is emptying her bladder.  She has occasional dysuria.  She states she previously was managed by Dr. Jacqlyn Larsen, who offered her medication for supposed overactive bladder.  She declined medical management at that time due to concern for side effects.  PMH also significant for irritable bowel syndrome with diarrhea.  She states she has a bowel movement at least once a day and it is always soft to liquid in texture.  In-office UA today positive for trace leukocyte esterace; urine microscopy with 11-30 WBCs/HPF.  PMH: Past Medical History:  Diagnosis Date  . Anxiety 01/02/2012  . Arthritis   . Asthma   . Chronic kidney disease    Kidney stones  . Colon polyps   . COPD (chronic obstructive pulmonary disease) ([place])   . Diabetes mellitus without complication ([place])   . GERD (gastroesophageal reflux disease)   . HOH (hard of hearing)    Bilateral hearing aids  . Osteoporosis   . Sleep apnea    Does not use C-PAP on a regular basis  . Venous stasis     Surgical History: Past Surgical History:  Procedure Laterality Date  . APPENDECTOMY    . cataract  surgery Bilateral   . CHOLECYSTECTOMY    . COLONOSCOPY    . COLONOSCOPY WITH PROPOFOL N/A 05/28/2015   Procedure: COLONOSCOPY WITH PROPOFOL;  Surgeon: Lollie Sails, MD;  Location: [facility] ENDOSCOPY;  Service: Endoscopy;  Laterality: N/A;  . COLONOSCOPY WITH PROPOFOL N/A 10/28/2018   Procedure: COLONOSCOPY WITH PROPOFOL;  Surgeon: Lollie Sails, MD;  Location: [facility] ENDOSCOPY;  Service: Endoscopy;  Laterality: N/A;  . EYE SURGERY Bilateral 2015   Cataract Extraction with IOL  . LITHOTRIPSY  2015   has had many stones  . Mastoidotomy  1970  . ROTATOR CUFF REPAIR Right 2011  . TOTAL HIP ARTHROPLASTY Right 06/14/2016   Procedure: TOTAL HIP ARTHROPLASTY;  Surgeon: Dereck Leep, MD;  Location: [facility] ORS;  Service: Orthopedics;  Laterality: Right;    Home Medications:  Allergies as of 04/09/2019      Reactions   Shellfish Allergy Swelling   "shrimp" Betadine okay   Sulfa Antibiotics Hives   Difficulty breathing   Sulfasalazine Hives   Difficulty breathing   Percocet [oxycodone-acetaminophen] Hives   Difficulty breathing   Iodinated Diagnostic Agents Itching   Iodine Itching      Medication List       Accurate as of April 09, 2019 11:59 PM. If you have any questions, ask your nurse or doctor.        acetaminophen 500 MG chewable tablet Commonly known as: TYLENOL Chew 1,000 mg by mouth  every 8 (eight) hours as needed for pain.   albuterol 108 (90 Base) MCG/ACT inhaler Commonly known as: VENTOLIN HFA Inhale 2 puffs into the lungs every 6 (six) hours as needed for wheezing or shortness of breath.   cholecalciferol 10 MCG (400 UNIT) Tabs tablet Commonly known as: VITAMIN D3 Take 2,000 Units by mouth daily.   enoxaparin 40 MG/0.4ML injection Commonly known as: LOVENOX Inject 0.4 mLs (40 mg total) into the skin daily.   ergocalciferol 1.25 MG (50000 UT) capsule Commonly known as: Drisdol Take 1 capsule (50,000 Units total) by mouth once a week.   furosemide 20 MG  tablet Commonly known as: LASIX TAKE 1 TABLET BY MOUTH DAILY   GenTeal 0.25-0.3 % Gel Generic drug: Carboxymethylcell-Hypromellose Apply 1 drop to eye as needed.   glucose blood test strip Blood sugar testing once daily . dx E11.65   ibandronate 150 MG tablet Commonly known as: BONIVA Take 1 tablet (150 mg total) by mouth every 30 (thirty) days.   Magnesium 250 MG Tabs Take by mouth daily.   meloxicam 7.5 MG tablet Commonly known as: MOBIC Take 1 tablet (7.5 mg total) by mouth 2 (two) times a day.   metFORMIN 500 MG tablet Commonly known as: GLUCOPHAGE Take 0.5 tablets (250 mg total) by mouth daily with breakfast.   montelukast 10 MG tablet Commonly known as: SINGULAIR TAKE 1 TABLET BY MOUTH DAILY FOR COUGH   nitrofurantoin (macrocrystal-monohydrate) 100 MG capsule Commonly known as: MACROBID Take 1 capsule (100 mg total) by mouth daily.   omeprazole 40 MG capsule Commonly known as: PRILOSEC Take 1 capsule (40 mg total) by mouth daily.   pneumococcal 23 valent vaccine 25 MCG/0.5ML injection Commonly known as: Pneumovax 23 Inject 0.64ml IM once   potassium chloride 8 MEQ tablet Commonly known as: KLOR-CON Take 8 mEq by mouth as needed (only take when taking Lasix).   tapentadol 50 MG tablet Commonly known as: NUCYNTA Take 1-2 tablets (50-100 mg total) by mouth every 4 (four) hours as needed for moderate pain.   tiotropium 18 MCG inhalation capsule Commonly known as: SPIRIVA Place 1 capsule (18 mcg total) into inhaler and inhale daily.       Allergies:  Allergies  Allergen Reactions  . Shellfish Allergy Swelling    "shrimp" Betadine okay  . Sulfa Antibiotics Hives    Difficulty breathing  . Sulfasalazine Hives    Difficulty breathing  . Percocet [Oxycodone-Acetaminophen] Hives    Difficulty breathing  . Iodinated Diagnostic Agents Itching  . Iodine Itching    Family History: Family History  Problem Relation Age of Onset  . Breast cancer  Maternal Aunt 104  . Bladder Cancer Neg Hx   . Kidney cancer Neg Hx     Social History:   reports that she has been smoking cigarettes. She has been smoking about 1.00 pack per day. She has never used smokeless tobacco. She reports current alcohol use. She reports that she does not use drugs.  ROS: UROLOGY Frequent Urination?: Yes Hard to postpone urination?: Yes Burning/pain with urination?: Yes Get up at night to urinate?: No Leakage of urine?: Yes Urine stream starts and stops?: Yes Trouble starting stream?: No Do you have to strain to urinate?: No Blood in urine?: No Urinary tract infection?: No Sexually transmitted disease?: No Injury to kidneys or bladder?: No Painful intercourse?: No Weak stream?: No Currently pregnant?: No Vaginal bleeding?: No Last menstrual period?: N/A  Gastrointestinal Nausea?: No Vomiting?: No Indigestion/heartburn?: No Diarrhea?: Yes Constipation?:  No  Constitutional Fever: No Night sweats?: No Weight loss?: No Fatigue?: Yes  Skin Skin rash/lesions?: No Itching?: No  Eyes Blurred vision?: No Double vision?: No  Ears/Nose/Throat Sore throat?: No Sinus problems?: No  Hematologic/Lymphatic Swollen glands?: No Easy bruising?: Yes  Cardiovascular Leg swelling?: Yes Chest pain?: No  Respiratory Cough?: No Shortness of breath?: Yes  Endocrine Excessive thirst?: No  Musculoskeletal Back pain?: Yes Joint pain?: Yes  Neurological Headaches?: No Dizziness?: No  Psychologic Depression?: No Anxiety?: No  Physical Exam: BP 110/69 (BP Location: Left Arm, Patient Position: Sitting, Cuff Size: Large)   Pulse 83   Ht 5\' 6"  (1.676 m)   Wt 180 lb (81.6 kg)   BMI 29.05 kg/m   Constitutional:  Alert and oriented, no acute distress, nontoxic appearing HEENT: [place], AT Cardiovascular: No clubbing, cyanosis, or edema Respiratory: Normal respiratory effort, no increased work of breathing Skin: No rashes, bruises or suspicious  lesions Neurologic: Grossly intact, no focal deficits, moving all 4 extremities Psychiatric: Normal mood and affect  Laboratory Data: Results for orders placed or performed in visit on 04/09/19  Microscopic Examination   URINE  Result Value Ref Range   WBC, UA 11-30 (A) 0 - 5 /hpf   RBC None seen 0 - 2 /hpf   Epithelial Cells (non renal) 0-10 0 - 10 /hpf   Bacteria, UA None seen None seen/Few  Urinalysis, Complete  Result Value Ref Range   Specific Gravity, UA 1.020 1.005 - 1.030   pH, UA 5.5 5.0 - 7.5   Color, UA Yellow Yellow   Appearance Ur Hazy (A) Clear   Leukocytes,UA Trace (A) Negative   Protein,UA Negative Negative/Trace   Glucose, UA Negative Negative   Ketones, UA Negative Negative   RBC, UA Negative Negative   Bilirubin, UA Negative Negative   Urobilinogen, Ur 0.2 0.2 - 1.0 mg/dL   Nitrite, UA Negative Negative   Microscopic Examination See below:   Bladder Scan (Post Void Residual) in office  Result Value Ref Range   Scan Result 24    Assessment & Plan:   1.  Urinary urgency Patient with voiding symptoms most consistent with overactive bladder.  She reports prior conversations with Dr. Jacqlyn Larsen that are consistent with this diagnosis.  I counseled her that while there are medications for OAB that carry side effects, there may be other medication alternatives with safer side effect profiles.  I believe that she may be a good candidate for Myrbetriq in the future.  I will defer to Dr. Diamantina Providence to prescribe this as she has already established care with him.  PVR WNL; I am not concerned for urinary retention today. -PVR  2.  Recurrent UTI Patient complaining of intermittent dysuria, however I am unsure if this is indicative of an active infection or secondary to her overactive bladder.  UA nonspecific.  Urine culture pending.  Given the patient's sulfa allergy, daily suppressive antibiotic, and recurrent growth of Cipro-resistant E. coli, I believe it is prudent to defer  antibiotics at this time.  I counseled the patient that I would contact her if her urine culture was positive and we would proceed with treatment at that time.  I did refill her daily suppressive antibiotic with a 1 month supply.  I counseled her to obtain a longer-term prescription from Dr. Diamantina Providence when she sees him in 2 weeks. -Urinalysis, Complete -Urine culture -Nitrofurantoin 100 mg daily, 1 month supply  Debroah Loop, PA-C  [facility] [address]  [place], [place] [address] [phone]

## 2019-04-10 LAB — URINALYSIS, COMPLETE
Bilirubin, UA: NEGATIVE
Glucose, UA: NEGATIVE
Ketones, UA: NEGATIVE
Nitrite, UA: NEGATIVE
Protein,UA: NEGATIVE
RBC, UA: NEGATIVE
Specific Gravity, UA: 1.02 (ref 1.005–1.030)
Urobilinogen, Ur: 0.2 mg/dL (ref 0.2–1.0)
pH, UA: 5.5 (ref 5.0–7.5)

## 2019-04-10 LAB — MICROSCOPIC EXAMINATION
Bacteria, UA: NONE SEEN
RBC, Urine: NONE SEEN /hpf (ref 0–2)

## 2019-04-15 DIAGNOSIS — K580 Irritable bowel syndrome with diarrhea: Secondary | ICD-10-CM | POA: Diagnosis not present

## 2019-04-17 ENCOUNTER — Other Ambulatory Visit: Payer: Self-pay

## 2019-04-17 DIAGNOSIS — J449 Chronic obstructive pulmonary disease, unspecified: Secondary | ICD-10-CM

## 2019-04-17 MED ORDER — TIOTROPIUM BROMIDE MONOHYDRATE 18 MCG IN CAPS: 18.0000 ug | ORAL_CAPSULE | Freq: Every day | RESPIRATORY_TRACT | 5 refills | Status: DC

## 2019-04-23 ENCOUNTER — Ambulatory Visit
Admission: RE | Admit: 2019-04-23 | Discharge: 2019-04-23 | Disposition: A | Discharge status: Home / Self Care | Payer: Medicare Other | Source: Ambulatory Visit | Attending: Urology | Admitting: Urology

## 2019-04-23 ENCOUNTER — Other Ambulatory Visit: Payer: Self-pay

## 2019-04-23 ENCOUNTER — Telehealth: Payer: Self-pay

## 2019-04-23 ENCOUNTER — Ambulatory Visit (INDEPENDENT_AMBULATORY_CARE_PROVIDER_SITE_OTHER): Payer: Medicare Other | Admitting: Urology

## 2019-04-23 ENCOUNTER — Encounter: Payer: Self-pay | Admitting: Urology

## 2019-04-23 VITALS — BP 124/76 | HR 73 | Ht 66.0 in | Wt 180.0 lb

## 2019-04-23 DIAGNOSIS — N3281 Overactive bladder: Secondary | ICD-10-CM | POA: Diagnosis not present

## 2019-04-23 DIAGNOSIS — Z87440 Personal history of urinary (tract) infections: Secondary | ICD-10-CM | POA: Diagnosis not present

## 2019-04-23 DIAGNOSIS — N200 Calculus of kidney: Secondary | ICD-10-CM | POA: Diagnosis not present

## 2019-04-23 DIAGNOSIS — R319 Hematuria, unspecified: Secondary | ICD-10-CM | POA: Diagnosis not present

## 2019-04-23 MED ORDER — NITROFURANTOIN MONOHYD MACRO 100 MG PO CAPS: 100.0000 mg | ORAL_CAPSULE | Freq: Every day | ORAL | 11 refills | Status: DC

## 2019-04-23 NOTE — Telephone Encounter (Signed)
-----   Message from Billey Co, MD sent at 04/23/2019  3:37 PM EDT ----- Kidney Joaquim Lai is stable, keep follow up as scheduled  Nickolas Madrid, MD 04/23/2019

## 2019-04-23 NOTE — Telephone Encounter (Signed)
Patient notified of vmail per DPR

## 2019-04-23 NOTE — Progress Notes (Signed)
   04/23/2019 12:48 PM   Amber Holder [birth year] [id number]  Reason for visit: Follow up nephrolithiasis, OAB  HPI: I saw Amber Holder back in urology clinic for follow-up today.  She was previously followed by Dr. Jacqlyn Larsen.  I originally met her in January 2020 for a long history of nephrolithiasis including a PCNL on the left side by Dr. Jacqlyn Larsen in 2014.  She also underwent a microscopic hematuria work-up in 2019 with Dr. Jacqlyn Larsen which was negative for any bladder lesions, and CT urogram showed only a nonobstructive 6 mm left upper pole stone.  On KUB today, the stone appears relatively stable.  She has been on low-dose daily nitrofurantoin from Dr. Jacqlyn Larsen for prevention of recurrent UTIs.  Notably, she also has a long history of diarrhea and incontinence of stool.  This is been improving over the last few months, and she is hopeful she can come off the low-dose antibiotics in the future.  She also reports bothersome overactive bladder symptoms of urgency, frequency, and occasional urge incontinence every 2 to 3 days.  She also has mild stress incontinence.  She is interested in trying medications for this.  She does not drink significant amounts of coffee, soda, or tea.  ROS: Please see flowsheet from today's date for complete review of systems.  Physical Exam: BP 124/76   Pulse 73   Ht '5\' 6"'$  (1.676 m)   Wt 180 lb (81.6 kg)   BMI 29.05 kg/m    Assessment & Plan:   In summary, she is a 73 year old female with history of PCNL with Dr. Jacqlyn Larsen, and stable left 6 mm upper pole asymptomatic kidney stone.  We discussed treatment options at length including observation, shockwave lithotripsy, or ureteroscopy, and she would like to proceed with observation at this time.  She would consider shockwave lithotripsy in the future if her stone is enlarging or becomes symptomatic.  We discussed that overactive bladder (OAB) is not a disease, but is a symptom complex that is generally not life-threatening.  Symptoms  typically include urinary urgency, frequency, and urge incontinence.  There are numerous treatment options, however there are risks and benefits with both medical and surgical management.  First-line treatment is behavioral therapies including bladder training, pelvic floor muscle training, and fluid management.  Second line treatments include oral antimuscarinics(Ditropan er, Trospium) and beta-3 agonist (Mybetriq). There is typically a period of medication trial (4-8 weeks) to find the optimal therapy and dosing. If symptoms are bothersome despite the above management, third line options include intra-detrusor botox, peripheral tibial nerve stimulation (PTNS), and interstim (SNS). These are more invasive treatments with higher side effect profile, but may improve quality of life for patients with severe OAB symptoms.   -Continue Macrobid for UTI prophylaxis, consider discontinuing in the future if bowel movements become regular and patient desires trial off -Trial of Myrbetriq 50 mg daily for OAB -RTC 4-6 weeks with PA for OAB symptom check -RTC 1 year with MD for KUB and stone surveillance  A total of 15 minutes were spent face-to-face with the patient, greater than 50% was spent in patient education, counseling, and coordination of care regarding nephrolithiasis and OAB.   Billey Co, [facility] [address] [place], [place] [address] [phone]

## 2019-04-29 ENCOUNTER — Other Ambulatory Visit: Payer: Self-pay | Admitting: Internal Medicine

## 2019-05-09 ENCOUNTER — Other Ambulatory Visit: Payer: Self-pay | Admitting: Nurse Practitioner

## 2019-05-09 ENCOUNTER — Encounter: Payer: Self-pay | Admitting: Nurse Practitioner

## 2019-05-09 ENCOUNTER — Ambulatory Visit (INDEPENDENT_AMBULATORY_CARE_PROVIDER_SITE_OTHER): Payer: Medicare Other | Admitting: Nurse Practitioner

## 2019-05-09 ENCOUNTER — Other Ambulatory Visit: Payer: Self-pay

## 2019-05-09 VITALS — BP 122/55 | HR 72 | Resp 16 | Ht 66.0 in | Wt 179.8 lb

## 2019-05-09 DIAGNOSIS — Z0001 Encounter for general adult medical examination with abnormal findings: Secondary | ICD-10-CM | POA: Diagnosis not present

## 2019-05-09 DIAGNOSIS — E559 Vitamin D deficiency, unspecified: Secondary | ICD-10-CM

## 2019-05-09 DIAGNOSIS — E1165 Type 2 diabetes mellitus with hyperglycemia: Secondary | ICD-10-CM | POA: Diagnosis not present

## 2019-05-09 DIAGNOSIS — Z1239 Encounter for other screening for malignant neoplasm of breast: Secondary | ICD-10-CM

## 2019-05-09 DIAGNOSIS — Z1231 Encounter for screening mammogram for malignant neoplasm of breast: Secondary | ICD-10-CM

## 2019-05-09 DIAGNOSIS — M810 Age-related osteoporosis without current pathological fracture: Secondary | ICD-10-CM | POA: Diagnosis not present

## 2019-05-09 DIAGNOSIS — R300 Dysuria: Secondary | ICD-10-CM | POA: Diagnosis not present

## 2019-05-09 DIAGNOSIS — J449 Chronic obstructive pulmonary disease, unspecified: Secondary | ICD-10-CM

## 2019-05-09 DIAGNOSIS — J302 Other seasonal allergic rhinitis: Secondary | ICD-10-CM | POA: Diagnosis not present

## 2019-05-09 LAB — POCT URINALYSIS DIPSTICK
Bilirubin, UA: NEGATIVE
Blood, UA: NEGATIVE
Glucose, UA: NEGATIVE
Ketones, UA: NEGATIVE
Leukocytes, UA: NEGATIVE
Nitrite, UA: NEGATIVE
Protein, UA: NEGATIVE
Spec Grav, UA: 1.01 (ref 1.010–1.025)
Urobilinogen, UA: 0.2 E.U./dL
pH, UA: 6.5 (ref 5.0–8.0)

## 2019-05-09 LAB — POCT GLYCOSYLATED HEMOGLOBIN (HGB A1C): Hemoglobin A1C: 6 % — AB (ref 4.0–5.6)

## 2019-05-09 MED ORDER — ERGOCALCIFEROL 1.25 MG (50000 UT) PO CAPS: 50000.0000 [IU] | ORAL_CAPSULE | ORAL | 3 refills | Status: DC

## 2019-05-09 MED ORDER — MONTELUKAST SODIUM 10 MG PO TABS: ORAL_TABLET | ORAL | 3 refills | Status: DC

## 2019-05-09 MED ORDER — IBANDRONATE SODIUM 150 MG PO TABS: 150.0000 mg | ORAL_TABLET | ORAL | 3 refills | Status: DC

## 2019-05-09 NOTE — Progress Notes (Signed)
[facility] [place], [place] [address]  Internal MEDICINE  Office Visit Note  Patient Name: Amber Holder  [id number]  [id number]  Date of Service: 05/18/2019   Pt is here for routine health maintenance examination   Chief Complaint  Patient presents with  . Annual Exam  . Diabetes  . Gastroesophageal Reflux  . Chronic Kidney Disease  . Quality Metric Gaps    URINE MICROALBUMIN, DIABETIC FOOT EXAM  . Medication Management    omeprazole --> to cholyesteramine packet, omeprazole gave her diarrhea, myrbetriq prescriibed by urologist and would like your opinion  . Medication Refill    montelukast,ibandronate, furosemide, and vitamin d2     The patient is here for health maintenance exam. Blood sugars are well controlled. HgbA1c is 6.0. she states that she is doing well. She will be due to have routine, fasting labs in November. She is due to have a diabetic eye exam. Her mammogram is scheduled for 05/21/2019.    Current Medication: Outpatient Encounter Medications as of 05/09/2019  Medication Sig  . acetaminophen (TYLENOL) 500 MG chewable tablet Chew 1,000 mg by mouth every 8 (eight) hours as needed for pain.  Marland Kitchen albuterol (PROVENTIL HFA;VENTOLIN HFA) 108 (90 Base) MCG/ACT inhaler Inhale 2 puffs into the lungs every 6 (six) hours as needed for wheezing or shortness of breath.  . cholecalciferol (VITAMIN D) 400 UNITS TABS tablet Take 2,000 Units by mouth daily.   . cholestyramine (QUESTRAN) 4 g packet Take by mouth.  . ergocalciferol (DRISDOL) 1.25 MG (50000 UT) capsule Take 1 capsule (50,000 Units total) by mouth once a week.  . furosemide (LASIX) 20 MG tablet TAKE 1 TABLET BY MOUTH DAILY  . glucose blood test strip Blood sugar testing once daily . dx E11.65  . ibandronate (BONIVA) 150 MG tablet Take 1 tablet (150 mg total) by mouth every 30 (thirty) days.  . Magnesium 250 MG TABS Take by mouth daily.  . meloxicam (MOBIC) 7.5 MG tablet TAKE 1 TABLET (7.5  MG TOTAL) BY MOUTH 2 (TWO) TIMES A DAY.  . metFORMIN (GLUCOPHAGE) 500 MG tablet Take 0.5 tablets (250 mg total) by mouth daily with breakfast.  . montelukast (SINGULAIR) 10 MG tablet TAKE 1 TABLET BY MOUTH DAILY FOR ALLERGIES  . nitrofurantoin, macrocrystal-monohydrate, (MACROBID) 100 MG capsule Take 1 capsule (100 mg total) by mouth daily.  . pneumococcal 23 valent vaccine (PNEUMOVAX 23) 25 MCG/0.5ML injection Inject 0.63ml IM once  . potassium chloride (KLOR-CON) 8 MEQ tablet Take 8 mEq by mouth as needed (only take when taking Lasix).   Marland Kitchen tiotropium (SPIRIVA) 18 MCG inhalation capsule Place 1 capsule (18 mcg total) into inhaler and inhale daily.  . [DISCONTINUED] ergocalciferol (DRISDOL) 1.25 MG (50000 UT) capsule Take 1 capsule (50,000 Units total) by mouth once a week.  . [DISCONTINUED] ibandronate (BONIVA) 150 MG tablet Take 1 tablet (150 mg total) by mouth every 30 (thirty) days.  . [DISCONTINUED] montelukast (SINGULAIR) 10 MG tablet TAKE 1 TABLET BY MOUTH DAILY FOR COUGH   No facility-administered encounter medications on file as of 05/09/2019.     Surgical History: Past Surgical History:  Procedure Laterality Date  . APPENDECTOMY    . cataract surgery Bilateral   . CHOLECYSTECTOMY    . COLONOSCOPY    . COLONOSCOPY WITH PROPOFOL N/A 05/28/2015   Procedure: COLONOSCOPY WITH PROPOFOL;  Surgeon: Lollie Sails, MD;  Location: [facility] ENDOSCOPY;  Service: Endoscopy;  Laterality: N/A;  . COLONOSCOPY WITH PROPOFOL N/A 10/28/2018  Procedure: COLONOSCOPY WITH PROPOFOL;  Surgeon: Lollie Sails, MD;  Location: [facility] ENDOSCOPY;  Service: Endoscopy;  Laterality: N/A;  . EYE SURGERY Bilateral 2015   Cataract Extraction with IOL  . LITHOTRIPSY  2015   has had many stones  . Mastoidotomy  1970  . ROTATOR CUFF REPAIR Right 2011  . TOTAL HIP ARTHROPLASTY Right 06/14/2016   Procedure: TOTAL HIP ARTHROPLASTY;  Surgeon: Dereck Leep, MD;  Location: [facility] ORS;  Service: Orthopedics;  Laterality:  Right;    Medical History: Past Medical History:  Diagnosis Date  . Anxiety 01/02/2012  . Arthritis   . Asthma   . Chronic kidney disease    Kidney stones  . Colon polyps   . COPD (chronic obstructive pulmonary disease) ([place])   . Diabetes mellitus without complication ([place])   . GERD (gastroesophageal reflux disease)   . HOH (hard of hearing)    Bilateral hearing aids  . Osteoporosis   . Sleep apnea    Does not use C-PAP on a regular basis  . Venous stasis     Family History: Family History  Problem Relation Age of Onset  . Breast cancer Maternal Aunt 50  . Bladder Cancer Neg Hx   . Kidney cancer Neg Hx       Review of Systems  Constitutional: Negative for activity change, chills, fatigue and unexpected weight change.  HENT: Negative for congestion, nosebleeds, postnasal drip, rhinorrhea and voice change.   Respiratory: Negative for cough, chest tightness, shortness of breath and wheezing.   Cardiovascular: Negative for chest pain and palpitations.  Gastrointestinal: Negative for constipation, diarrhea, nausea and vomiting.  Endocrine: Negative for cold intolerance, heat intolerance, polydipsia and polyuria.       Blood sugars doing well   Genitourinary: Negative for difficulty urinating, dysuria, flank pain, frequency and urgency.       Patient is now seeing new urologist for chronic kidney infections and kidney stones. On prophylactic antibiotics.   Musculoskeletal: Positive for back pain.  Skin: Negative for rash.  Allergic/Immunologic: Positive for environmental allergies.  Neurological: Negative for dizziness and headaches.  Hematological: Negative for adenopathy.  Psychiatric/Behavioral: Negative for behavioral problems, dysphoric mood and sleep disturbance. The patient is not nervous/anxious.      Today's Vitals   05/09/19 0954  BP: (!) 122/55  Pulse: 72  Resp: 16  SpO2: 97%  Weight: 179 lb 12.8 oz (81.6 kg)  Height: 5\' 6"  (1.676 m)   Body mass index  is 29.02 kg/m.  Physical Exam Vitals signs and nursing note reviewed.  Constitutional:      General: She is not in acute distress.    Appearance: Normal appearance. She is well-developed. She is not diaphoretic.  HENT:     Head: Normocephalic and atraumatic.     Mouth/Throat:     Pharynx: No oropharyngeal exudate.  Eyes:     Pupils: Pupils are equal, round, and reactive to light.  Neck:     Musculoskeletal: Normal range of motion and neck supple.     Thyroid: No thyromegaly.     Vascular: No carotid bruit or JVD.     Trachea: No tracheal deviation.  Cardiovascular:     Rate and Rhythm: Normal rate and regular rhythm.     Pulses:          Dorsalis pedis pulses are 2+ on the right side and 1+ on the left side.       Posterior tibial pulses are 2+ on the right side  and 2+ on the left side.     Heart sounds: Normal heart sounds. No murmur. No friction rub. No gallop.   Pulmonary:     Effort: Pulmonary effort is normal. No respiratory distress.     Breath sounds: Normal breath sounds. No wheezing or rales.  Chest:     Chest wall: No tenderness.     Breasts:        Right: Normal. No swelling, bleeding, inverted nipple, mass, nipple discharge, skin change or tenderness.        Left: Normal. No swelling, bleeding, inverted nipple, mass, nipple discharge, skin change or tenderness.  Abdominal:     General: Bowel sounds are normal.     Palpations: Abdomen is soft.     Tenderness: There is no abdominal tenderness.  Genitourinary:    Comments: Urine dipstick negative for acute abnormalities or infection today.  Musculoskeletal: Normal range of motion.     Right foot: Normal range of motion. No deformity.     Left foot: Normal range of motion. No deformity.  Feet:     Right foot:     Protective Sensation: 10 sites tested. 10 sites sensed.     Skin integrity: Skin integrity normal.     Toenail Condition: Right toenails are normal.     Left foot:     Protective Sensation: 10 sites  tested. 10 sites sensed.     Skin integrity: Skin integrity normal.     Toenail Condition: Left toenails are normal.  Lymphadenopathy:     Cervical: No cervical adenopathy.  Skin:    General: Skin is warm and dry.  Neurological:     General: No focal deficit present.     Mental Status: She is alert and oriented to person, place, and time.     Cranial Nerves: No cranial nerve deficit.  Psychiatric:        Behavior: Behavior normal.        Thought Content: Thought content normal.        Judgment: Judgment normal.    Depression screen [facility] 2/9 05/09/2019 01/30/2019 08/06/2018 03/19/2018 11/01/2017  Decreased Interest 0 0 0 0 0  Down, Depressed, Hopeless 0 0 0 0 0  PHQ - 2 Score 0 0 0 0 0    Functional Status Survey: Is the patient deaf or have difficulty hearing?: No Does the patient have difficulty seeing, even when wearing glasses/contacts?: No Does the patient have difficulty concentrating, remembering, or making decisions?: No Does the patient have difficulty walking or climbing stairs?: Yes Does the patient have difficulty dressing or bathing?: No Does the patient have difficulty doing errands alone such as visiting a doctor's office or shopping?: No  MMSE - [place] Exam 05/09/2019 03/19/2018  Orientation to time 5 5  Orientation to Place 5 5  Registration 3 3  Attention/ Calculation 5 5  Recall 3 3  Language- name 2 objects 2 2  Language- repeat 1 1  Language- follow 3 step command 3 3  Language- read & follow direction 1 1  Write a sentence 1 1  Copy design 1 1  Total score 30 30    Fall Risk  05/09/2019 01/30/2019 08/06/2018 03/19/2018 11/01/2017  Falls in the past year? 0 0 0 No No  Number falls in past yr: - - 0 - -  Injury with Fall? - - 0 - -      LABS: Recent Results (from the past 2160 hour(s))  Novel Coronavirus, NAA ([facility])  Status: None   Collection Time: 03/10/19 12:00 AM  Result Value Ref Range   SARS-CoV-2, NAA Not Detected Not Detected     Comment: This test was developed and its performance characteristics determined by [employer]. This test has not been FDA cleared or approved. This test has been authorized by FDA under an Emergency Use Authorization (EUA). This test is only authorized for the duration of time the declaration that circumstances exist justifying the authorization of the emergency use of in vitro diagnostic tests for detection of SARS-CoV-2 virus and/or diagnosis of COVID-19 infection under section 564(b)(1) of the Act, 21 U.S.C. KA:123727), unless the authorization is terminated or revoked sooner. When diagnostic testing is negative, the possibility of a false negative result should be considered in the context of a patient's recent exposures and the presence of clinical signs and symptoms consistent with COVID-19. An individual without symptoms of COVID-19 and who is not shedding SARS-CoV-2 virus would expect to have a negative (not detected) result in this assay.   Urinalysis, Complete     Status: Abnormal   Collection Time: 04/09/19 10:51 AM  Result Value Ref Range   Specific Gravity, UA 1.020 1.005 - 1.030   pH, UA 5.5 5.0 - 7.5   Color, UA Yellow Yellow   Appearance Ur Hazy (A) Clear   Leukocytes,UA Trace (A) Negative   Protein,UA Negative Negative/Trace   Glucose, UA Negative Negative   Ketones, UA Negative Negative   RBC, UA Negative Negative   Bilirubin, UA Negative Negative   Urobilinogen, Ur 0.2 0.2 - 1.0 mg/dL   Nitrite, UA Negative Negative   Microscopic Examination See below:   Microscopic Examination     Status: Abnormal   Collection Time: 04/09/19 10:51 AM   URINE  Result Value Ref Range   WBC, UA 11-30 (A) 0 - 5 /hpf   RBC None seen 0 - 2 /hpf   Epithelial Cells (non renal) 0-10 0 - 10 /hpf   Bacteria, UA None seen None seen/Few  Bladder Scan (Post Void Residual) in office     Status: None   Collection Time: 04/09/19 12:00 PM  Result Value Ref Range   Scan  Result 24   POCT HgB A1C     Status: Abnormal   Collection Time: 05/09/19 10:33 AM  Result Value Ref Range   Hemoglobin A1C 6.0 (A) 4.0 - 5.6 %   HbA1c POC (<> result, manual entry)     HbA1c, POC (prediabetic range)     HbA1c, POC (controlled diabetic range)    POCT Urinalysis Dipstick     Status: None   Collection Time: 05/09/19 10:33 AM  Result Value Ref Range   Color, UA     Clarity, UA     Glucose, UA Negative Negative   Bilirubin, UA negative    Ketones, UA negative    Spec Grav, UA 1.010 1.010 - 1.025   Blood, UA negative    pH, UA 6.5 5.0 - 8.0   Protein, UA Negative Negative   Urobilinogen, UA 0.2 0.2 or 1.0 E.U./dL   Nitrite, UA negative    Leukocytes, UA Negative Negative   Appearance     Odor    Microalbumin, urine     Status: None   Collection Time: 05/09/19 10:47 AM  Result Value Ref Range   Microalbumin, Urine 5.7 Not Estab. ug/mL   Assessment/Plan:  1. Encounter for general adult medical examination with abnormal findings Annual health maintenance exam today.  2. Type  2 diabetes mellitus with hyperglycemia, unspecified whether long term insulin use (HCC) HgbA1c 6.0 today. Continue diabetic medication as prescribed. Refer for diabetic eye exam.  - POCT HgB A1C - Microalbumin, urine - Ambulatory referral to Ophthalmology  3. Age-related osteoporosis without current pathological fracture Continue boniva monthly - ibandronate (BONIVA) 150 MG tablet; Take 1 tablet (150 mg total) by mouth every 30 (thirty) days.  Dispense: 3 tablet; Refill: 3  4. Chronic obstructive pulmonary disease, unspecified COPD type ([place]) Continue inhalers and respiratory medication as prescribed.   5. Seasonal allergic rhinitis, unspecified trigger - montelukast (SINGULAIR) 10 MG tablet; TAKE 1 TABLET BY MOUTH DAILY FOR ALLERGIES  Dispense: 90 tablet; Refill: 3  6. Vitamin D deficiency - ergocalciferol (DRISDOL) 1.25 MG (50000 UT) capsule; Take 1 capsule (50,000 Units total) by  mouth once a week.  Dispense: 12 capsule; Refill: 3  7. Dysuria - POCT Urinalysis Dipstick negative for blood or evidence of infection today.   8. Screening for breast cancer Mammogram ordered today.    General Counseling: Alahni verbalizes understanding of the findings of todays visit and agrees with plan of treatment. I have discussed any further diagnostic evaluation that may be needed or ordered today. We also reviewed her medications today. she has been encouraged to call the office with any questions or concerns that should arise related to todays visit.    Counseling:  Diabetes Counseling:  1. Addition of ACE inh/ ARB'S for nephroprotection. Microalbumin is updated  2. Diabetic foot care, prevention of complications. Podiatry consult 3. Exercise and lose weight.  4. Diabetic eye examination, Diabetic eye exam is updated  5. Monitor blood sugar closlely. nutrition counseling.  6. Sign and symptoms of hypoglycemia including shaking sweating,confusion and headaches.  This patient was seen by Leretha Pol FNP Collaboration with Dr Lavera Guise as a part of collaborative care agreement  Orders Placed This Encounter  Procedures  . Microalbumin, urine  . Ambulatory referral to Ophthalmology  . POCT HgB A1C  . POCT Urinalysis Dipstick    Meds ordered this encounter  Medications  . montelukast (SINGULAIR) 10 MG tablet    Sig: TAKE 1 TABLET BY MOUTH DAILY FOR ALLERGIES    Dispense:  90 tablet    Refill:  3    Order Specific Question:   Supervising Provider    Answer:   Lavera Guise [id number]  . ibandronate (BONIVA) 150 MG tablet    Sig: Take 1 tablet (150 mg total) by mouth every 30 (thirty) days.    Dispense:  3 tablet    Refill:  3    Order Specific Question:   Supervising Provider    Answer:   Lavera Guise [id number]  . ergocalciferol (DRISDOL) 1.25 MG (50000 UT) capsule    Sig: Take 1 capsule (50,000 Units total) by mouth once a week.    Dispense:  12 capsule    Refill:   3    Order Specific Question:   Supervising Provider    Answer:   Lavera Guise [id number]    Time spent: [place], MD  Internal Medicine

## 2019-05-10 LAB — MICROALBUMIN, URINE: Microalbumin, Urine: 5.7 ug/mL

## 2019-05-15 ENCOUNTER — Other Ambulatory Visit: Payer: Self-pay | Admitting: Gastroenterology

## 2019-05-15 ENCOUNTER — Other Ambulatory Visit (HOSPITAL_COMMUNITY): Payer: Self-pay | Admitting: Gastroenterology

## 2019-05-15 DIAGNOSIS — R102 Pelvic and perineal pain: Secondary | ICD-10-CM | POA: Diagnosis not present

## 2019-05-15 DIAGNOSIS — K580 Irritable bowel syndrome with diarrhea: Secondary | ICD-10-CM | POA: Diagnosis not present

## 2019-05-18 DIAGNOSIS — E1165 Type 2 diabetes mellitus with hyperglycemia: Secondary | ICD-10-CM | POA: Insufficient documentation

## 2019-05-18 DIAGNOSIS — J302 Other seasonal allergic rhinitis: Secondary | ICD-10-CM | POA: Insufficient documentation

## 2019-05-18 DIAGNOSIS — Z1239 Encounter for other screening for malignant neoplasm of breast: Secondary | ICD-10-CM | POA: Insufficient documentation

## 2019-05-21 ENCOUNTER — Ambulatory Visit: Payer: Medicare Other | Admitting: Physician Assistant

## 2019-05-21 ENCOUNTER — Ambulatory Visit
Admission: RE | Admit: 2019-05-21 | Discharge: 2019-05-21 | Disposition: A | Discharge status: Home / Self Care | Payer: Medicare Other | Source: Ambulatory Visit | Attending: Nurse Practitioner | Admitting: Nurse Practitioner

## 2019-05-21 DIAGNOSIS — Z1231 Encounter for screening mammogram for malignant neoplasm of breast: Secondary | ICD-10-CM

## 2019-05-22 ENCOUNTER — Ambulatory Visit
Admission: RE | Admit: 2019-05-22 | Discharge: 2019-05-22 | Disposition: A | Discharge status: Home / Self Care | Payer: Medicare Other | Source: Ambulatory Visit | Attending: Gastroenterology | Admitting: Gastroenterology

## 2019-05-22 ENCOUNTER — Other Ambulatory Visit: Payer: Self-pay

## 2019-05-22 DIAGNOSIS — R102 Pelvic and perineal pain: Secondary | ICD-10-CM

## 2019-06-18 DIAGNOSIS — E119 Type 2 diabetes mellitus without complications: Secondary | ICD-10-CM | POA: Diagnosis not present

## 2019-06-18 DIAGNOSIS — J438 Other emphysema: Secondary | ICD-10-CM | POA: Diagnosis not present

## 2019-06-18 DIAGNOSIS — N840 Polyp of corpus uteri: Secondary | ICD-10-CM | POA: Diagnosis not present

## 2019-06-18 DIAGNOSIS — R9389 Abnormal findings on diagnostic imaging of other specified body structures: Secondary | ICD-10-CM | POA: Diagnosis not present

## 2019-06-18 DIAGNOSIS — N858 Other specified noninflammatory disorders of uterus: Secondary | ICD-10-CM | POA: Diagnosis not present

## 2019-06-19 DIAGNOSIS — Z96641 Presence of right artificial hip joint: Secondary | ICD-10-CM | POA: Diagnosis not present

## 2019-06-19 DIAGNOSIS — M1611 Unilateral primary osteoarthritis, right hip: Secondary | ICD-10-CM | POA: Diagnosis not present

## 2019-06-20 ENCOUNTER — Encounter: Payer: Self-pay | Admitting: Physician Assistant

## 2019-06-20 ENCOUNTER — Other Ambulatory Visit: Payer: Self-pay

## 2019-06-20 ENCOUNTER — Ambulatory Visit (INDEPENDENT_AMBULATORY_CARE_PROVIDER_SITE_OTHER): Payer: Medicare Other | Admitting: Physician Assistant

## 2019-06-20 VITALS — BP 124/75 | HR 71 | Ht 66.0 in | Wt 179.0 lb

## 2019-06-20 DIAGNOSIS — N3281 Overactive bladder: Secondary | ICD-10-CM | POA: Diagnosis not present

## 2019-06-20 LAB — BLADDER SCAN AMB NON-IMAGING

## 2019-06-20 NOTE — Progress Notes (Signed)
06/20/2019 11:22 AM   Amber Holder Amber Holder [birth year] [id number]  CC: F/u Myrbetriq trial  HPI: Amber Holder is a 73 y.o. female who presents today for follow-up of Myrbetriq trial for OAB. She is an established [facility] patient who last saw Amber Holder on 04/23/2019 for follow-up of nephrolithiasis and OAB.  She was given a 1 month supply of Myrbetriq 50 mg at that time.  In the interim, patient explains that she saw gastroenterology for chronic diarrhea.  She was recently started on cholestyramine for this, which she feels is helping.  She states that she did not start the Myrbetriq immediately after her last visit with Amber Holder, because she did not want to start both medications at the same time.  Of note, ultrasound ordered by GI revealed an abnormal endometrial stripe.  She had an endometrial biopsy with Amber Holder two days ago.  She was encouraged at that visit to initiate the Myrbetriq.  She did so and has now been on this medication for 2 days.  She reports OAB symptoms as below.  Also with some stress incontinence.  Urgency: Moderate (4-7x/day) Frequency: Moderate Fluid restriction: No Toilet mapping: No Incontinence: Mild (0-3x/week) Nocturia: Mild (0-3x/night)  PVR 78mL.  PMH: Past Medical History:  Diagnosis Date  . Anxiety 01/02/2012  . Arthritis   . Asthma   . Chronic kidney disease    Kidney stones  . Colon polyps   . COPD (chronic obstructive pulmonary disease) ([place])   . Diabetes mellitus without complication ([place])   . GERD (gastroesophageal reflux disease)   . HOH (hard of hearing)    Bilateral hearing aids  . Osteoporosis   . Sleep apnea    Does not use C-PAP on a regular basis  . Venous stasis     Surgical History: Past Surgical History:  Procedure Laterality Date  . APPENDECTOMY    . cataract surgery Bilateral   . CHOLECYSTECTOMY    . COLONOSCOPY    . COLONOSCOPY WITH PROPOFOL N/A 05/28/2015   Procedure: COLONOSCOPY WITH PROPOFOL;  Surgeon: Lollie Sails, MD;  Location: [facility] ENDOSCOPY;  Service: Endoscopy;  Laterality: N/A;  . COLONOSCOPY WITH PROPOFOL N/A 10/28/2018   Procedure: COLONOSCOPY WITH PROPOFOL;  Surgeon: Lollie Sails, MD;  Location: [facility] ENDOSCOPY;  Service: Endoscopy;  Laterality: N/A;  . EYE SURGERY Bilateral 2015   Cataract Extraction with IOL  . LITHOTRIPSY  2015   has had many stones  . Mastoidotomy  1970  . ROTATOR CUFF REPAIR Right 2011  . TOTAL HIP ARTHROPLASTY Right 06/14/2016   Procedure: TOTAL HIP ARTHROPLASTY;  Surgeon: Dereck Leep, MD;  Location: [facility] ORS;  Service: Orthopedics;  Laterality: Right;    Home Medications:  Allergies as of 06/20/2019      Reactions   Shellfish Allergy Swelling   "shrimp" Betadine okay   Sulfa Antibiotics Hives   Difficulty breathing   Sulfasalazine Hives   Difficulty breathing   Percocet [oxycodone-acetaminophen] Hives   Difficulty breathing   Iodinated Diagnostic Agents Itching   Iodine Itching      Medication List       Accurate as of June 20, 2019 11:22 AM. If you have any questions, ask your nurse or doctor.        acetaminophen 500 MG chewable tablet Commonly known as: TYLENOL Chew 1,000 mg by mouth every 8 (eight) hours as needed for pain.   albuterol 108 (90 Base) MCG/ACT inhaler Commonly known as: VENTOLIN HFA Inhale 2 puffs into the  lungs every 6 (six) hours as needed for wheezing or shortness of breath.   cholecalciferol 10 MCG (400 UNIT) Tabs tablet Commonly known as: VITAMIN D3 Take 2,000 Units by mouth daily.   cholestyramine 4 g packet Commonly known as: QUESTRAN Take by mouth.   ergocalciferol 1.25 MG (50000 UT) capsule Commonly known as: Drisdol Take 1 capsule (50,000 Units total) by mouth once a week.   furosemide 20 MG tablet Commonly known as: LASIX TAKE 1 TABLET BY MOUTH DAILY   glucose blood test strip Blood sugar testing once daily . dx E11.65   ibandronate 150 MG tablet Commonly known as: BONIVA Take 1  tablet (150 mg total) by mouth every 30 (thirty) days.   Magnesium 250 MG Tabs Take by mouth daily.   meloxicam 7.5 MG tablet Commonly known as: MOBIC TAKE 1 TABLET (7.5 MG TOTAL) BY MOUTH 2 (TWO) TIMES A DAY.   metFORMIN 500 MG tablet Commonly known as: GLUCOPHAGE Take 0.5 tablets (250 mg total) by mouth daily with breakfast.   mirabegron ER 50 MG Tb24 tablet Commonly known as: MYRBETRIQ Take by mouth.   montelukast 10 MG tablet Commonly known as: SINGULAIR TAKE 1 TABLET BY MOUTH DAILY FOR ALLERGIES   nitrofurantoin (macrocrystal-monohydrate) 100 MG capsule Commonly known as: MACROBID Take 1 capsule (100 mg total) by mouth daily.   pneumococcal 23 valent vaccine 25 MCG/0.5ML injection Commonly known as: Pneumovax 23 Inject 0.63ml IM once   potassium chloride 8 MEQ tablet Commonly known as: KLOR-CON Take 8 mEq by mouth as needed (only take when taking Lasix).   tiotropium 18 MCG inhalation capsule Commonly known as: SPIRIVA Place 1 capsule (18 mcg total) into inhaler and inhale daily.       Allergies:  Allergies  Allergen Reactions  . Shellfish Allergy Swelling    "shrimp" Betadine okay  . Sulfa Antibiotics Hives    Difficulty breathing  . Sulfasalazine Hives    Difficulty breathing  . Percocet [Oxycodone-Acetaminophen] Hives    Difficulty breathing  . Iodinated Diagnostic Agents Itching  . Iodine Itching    Family History: Family History  Problem Relation Age of Onset  . Breast cancer Maternal Aunt 29  . Bladder Cancer Neg Hx   . Kidney cancer Neg Hx     Social History:   reports that she has been smoking cigarettes. She has been smoking about 1.00 pack per day. She has never used smokeless tobacco. She reports previous alcohol use. She reports that she does not use drugs.  ROS: UROLOGY Frequent Urination?: Yes Hard to postpone urination?: No Burning/pain with urination?: No Get up at night to urinate?: Yes Leakage of urine?: Yes Urine stream  starts and stops?: No Trouble starting stream?: No Do you have to strain to urinate?: No Blood in urine?: No Urinary tract infection?: No Sexually transmitted disease?: No Injury to kidneys or bladder?: No Painful intercourse?: No Weak stream?: No Currently pregnant?: No Vaginal bleeding?: No  Gastrointestinal Nausea?: No Vomiting?: No Indigestion/heartburn?: No Diarrhea?: Yes Constipation?: No  Constitutional Fever: No Night sweats?: No Weight loss?: No Fatigue?: No  Skin Skin rash/lesions?: No Itching?: No  Eyes Blurred vision?: No Double vision?: No  Ears/Nose/Throat Sore throat?: No Sinus problems?: No  Hematologic/Lymphatic Swollen glands?: No Easy bruising?: No  Cardiovascular Leg swelling?: No Chest pain?: No  Respiratory Cough?: No Shortness of breath?: No  Endocrine Excessive thirst?: No  Musculoskeletal Back pain?: Yes Joint pain?: Yes  Neurological Headaches?: No Dizziness?: No     Physical  Exam: BP 124/75   Pulse 71   Ht 5\' 6"  (1.676 m)   Wt 179 lb (81.2 kg)   BMI 28.89 kg/m   Constitutional:  Alert and oriented, no acute distress, nontoxic appearing HEENT: [place], AT, hard of hearing Cardiovascular: No clubbing, cyanosis, or edema Respiratory: Normal respiratory effort, no increased work of breathing Skin: No rashes, bruises or suspicious lesions Neurologic: Grossly intact, no focal deficits, moving all 4 extremities Psychiatric: Normal mood and affect  Laboratory Data: Results for orders placed or performed in visit on 06/20/19  BLADDER SCAN AMB NON-IMAGING  Result Value Ref Range   Scan Result 5ML    Assessment & Plan:   1. OAB (overactive bladder) Patient with stable OAB symptoms, has only been on Myrbetriq for two days. PVR WNL, no concern for urinary retention today. I explained that it can take several weeks on Myrbetriq to see the full therapeutic benefit.  She expressed understanding.  Encouraged her to continue  this medication and follow-up in another month for reevaluation. - BLADDER SCAN AMB NON-IMAGING - Return in about 4 weeks (around 07/18/2019) for PVR and OAB questionnaire.  Debroah Loop, PA-C  [facility] [address] [place], [place] [address] [phone]

## 2019-07-03 DIAGNOSIS — K580 Irritable bowel syndrome with diarrhea: Secondary | ICD-10-CM | POA: Diagnosis not present

## 2019-07-03 DIAGNOSIS — D369 Benign neoplasm, unspecified site: Secondary | ICD-10-CM | POA: Diagnosis not present

## 2019-07-03 DIAGNOSIS — N840 Polyp of corpus uteri: Secondary | ICD-10-CM | POA: Diagnosis not present

## 2019-07-03 DIAGNOSIS — R9389 Abnormal findings on diagnostic imaging of other specified body structures: Secondary | ICD-10-CM | POA: Diagnosis not present

## 2019-07-07 DIAGNOSIS — Z23 Encounter for immunization: Secondary | ICD-10-CM | POA: Diagnosis not present

## 2019-07-09 DIAGNOSIS — H7201 Central perforation of tympanic membrane, right ear: Secondary | ICD-10-CM | POA: Diagnosis not present

## 2019-07-09 DIAGNOSIS — H903 Sensorineural hearing loss, bilateral: Secondary | ICD-10-CM | POA: Diagnosis not present

## 2019-07-09 DIAGNOSIS — H6530 Chronic mucoid otitis media, unspecified ear: Secondary | ICD-10-CM | POA: Diagnosis not present

## 2019-07-17 ENCOUNTER — Other Ambulatory Visit: Payer: Self-pay

## 2019-07-17 ENCOUNTER — Encounter: Payer: Self-pay | Admitting: Physician Assistant

## 2019-07-17 ENCOUNTER — Ambulatory Visit (INDEPENDENT_AMBULATORY_CARE_PROVIDER_SITE_OTHER): Payer: Medicare Other | Admitting: Physician Assistant

## 2019-07-17 VITALS — BP 113/71 | HR 79 | Ht 66.0 in | Wt 179.0 lb

## 2019-07-17 DIAGNOSIS — N3281 Overactive bladder: Secondary | ICD-10-CM | POA: Diagnosis not present

## 2019-07-17 DIAGNOSIS — R252 Cramp and spasm: Secondary | ICD-10-CM

## 2019-07-17 LAB — BLADDER SCAN AMB NON-IMAGING

## 2019-07-17 MED ORDER — MIRABEGRON ER 50 MG PO TB24: 50.0000 mg | ORAL_TABLET | Freq: Every day | ORAL | 0 refills | Status: DC

## 2019-07-17 MED ORDER — MIRABEGRON ER 50 MG PO TB24: 50.0000 mg | ORAL_TABLET | Freq: Every day | ORAL | 2 refills | Status: DC

## 2019-07-17 NOTE — Progress Notes (Signed)
07/17/2019 11:45 AM   Amber Holder 1945-11-25 [id number]  CC: OAB follow-up  HPI: Amber Holder is a 73 y.o. female who presents today for follow-up of OAB on Myrbetriq 50 mg daily. She is an established [facility] patient who last saw me on 06/20/2019 for the same.  She had been given Myrbetriq samples 1 month prior to that visit, however she reported only having taken the medication for 2 days prior to seeing me.  I advised her to continue on daily Myrbetriq and follow-up in 1 month.  Today she reports significant improvement in her OAB symptoms.  She has had only 1 episode of urinary incontinence in the last month and found that she was able to stop it.  Prior to initiating Myrbetriq, she reports complete bladder emptying with her occasional incontinence episodes.  OAB symptoms as below.  Urgency: Mild (0-3x/day) Frequency: Moderate (4-7x/day) Fluid restriction: Some (4-7x/week) Toilet mapping: No Urinary incontinence: Mild (0-3x/week) Nocturia: Mild (0-3x/night)  Patient is currently managed by gastroenterology for IBS with diarrhea.  She takes daily cholestyramine and dicyclomine as needed for management of this.  Additionally, she reports her recent endometrial biopsy with Dr. Leafy Ro resulted with an endometrial polyp.  She plans to undergo D&C for treatment of this in the coming weeks.  She reports her only acute concern is a "tremor" that she feels in her vaginal region.  She states this started within the last month and is intermittent.  She denies pain associated with this and states it is more annoying in nature.  PVR 82mL.  PMH: Past Medical History:  Diagnosis Date  . Anxiety 01/02/2012  . Arthritis   . Asthma   . Chronic kidney disease    Kidney stones  . Colon polyps   . COPD (chronic obstructive pulmonary disease) ([place])   . Diabetes mellitus without complication ([place])   . GERD (gastroesophageal reflux disease)   . HOH (hard of hearing)    Bilateral hearing aids   . Osteoporosis   . Sleep apnea    Does not use C-PAP on a regular basis  . Venous stasis     Surgical History: Past Surgical History:  Procedure Laterality Date  . APPENDECTOMY    . cataract surgery Bilateral   . CHOLECYSTECTOMY    . COLONOSCOPY    . COLONOSCOPY WITH PROPOFOL N/A 05/28/2015   Procedure: COLONOSCOPY WITH PROPOFOL;  Surgeon: Lollie Sails, MD;  Location: [facility] ENDOSCOPY;  Service: Endoscopy;  Laterality: N/A;  . COLONOSCOPY WITH PROPOFOL N/A 10/28/2018   Procedure: COLONOSCOPY WITH PROPOFOL;  Surgeon: Lollie Sails, MD;  Location: [facility] ENDOSCOPY;  Service: Endoscopy;  Laterality: N/A;  . EYE SURGERY Bilateral 2015   Cataract Extraction with IOL  . LITHOTRIPSY  2015   has had many stones  . Mastoidotomy  1970  . ROTATOR CUFF REPAIR Right 2011  . TOTAL HIP ARTHROPLASTY Right 06/14/2016   Procedure: TOTAL HIP ARTHROPLASTY;  Surgeon: Dereck Leep, MD;  Location: [facility] ORS;  Service: Orthopedics;  Laterality: Right;    Home Medications:  Allergies as of 07/17/2019      Reactions   Shellfish Allergy Swelling   "shrimp" Betadine okay   Sulfa Antibiotics Hives   Difficulty breathing   Sulfasalazine Hives   Difficulty breathing   Percocet [oxycodone-acetaminophen] Hives   Difficulty breathing   Iodinated Diagnostic Agents Itching   Iodine Itching      Medication List       Accurate as of July 17, 2019 11:45  AM. If you have any questions, ask your nurse or doctor.        acetaminophen 500 MG chewable tablet Commonly known as: TYLENOL Chew 1,000 mg by mouth every 8 (eight) hours as needed for pain.   albuterol 108 (90 Base) MCG/ACT inhaler Commonly known as: VENTOLIN HFA Inhale 2 puffs into the lungs every 6 (six) hours as needed for wheezing or shortness of breath.   cholecalciferol 10 MCG (400 UNIT) Tabs tablet Commonly known as: VITAMIN D3 Take 2,000 Units by mouth daily.   cholestyramine 4 g packet Commonly known as: QUESTRAN Take by  mouth.   ciprofloxacin-dexamethasone OTIC suspension Commonly known as: CIPRODEX APPLY 3 4 DROPS TO RIGHT EAR 2 3 TIMES DAILY FOR 1 WEEK THEN USE AS NEEDED   ergocalciferol 1.25 MG (50000 UT) capsule Commonly known as: Drisdol Take 1 capsule (50,000 Units total) by mouth once a week.   furosemide 20 MG tablet Commonly known as: LASIX TAKE 1 TABLET BY MOUTH DAILY   glucose blood test strip Blood sugar testing once daily . dx E11.65   ibandronate 150 MG tablet Commonly known as: BONIVA Take 1 tablet (150 mg total) by mouth every 30 (thirty) days.   Magnesium 250 MG Tabs Take by mouth daily.   meloxicam 7.5 MG tablet Commonly known as: MOBIC TAKE 1 TABLET (7.5 MG TOTAL) BY MOUTH 2 (TWO) TIMES A DAY.   metFORMIN 500 MG tablet Commonly known as: GLUCOPHAGE Take 0.5 tablets (250 mg total) by mouth daily with breakfast.   mirabegron ER 50 MG Tb24 tablet Commonly known as: MYRBETRIQ Take 1 tablet (50 mg total) by mouth daily. What changed:   how much to take  when to take this Changed by: Debroah Loop, PA-C   montelukast 10 MG tablet Commonly known as: SINGULAIR TAKE 1 TABLET BY MOUTH DAILY FOR ALLERGIES   nitrofurantoin (macrocrystal-monohydrate) 100 MG capsule Commonly known as: MACROBID Take 1 capsule (100 mg total) by mouth daily.   pneumococcal 23 valent vaccine 25 MCG/0.5ML injection Commonly known as: Pneumovax 23 Inject 0.16ml IM once   potassium chloride 8 MEQ tablet Commonly known as: KLOR-CON Take 8 mEq by mouth as needed (only take when taking Lasix).   tiotropium 18 MCG inhalation capsule Commonly known as: SPIRIVA Place 1 capsule (18 mcg total) into inhaler and inhale daily.      Allergies:  Allergies  Allergen Reactions  . Shellfish Allergy Swelling    "shrimp" Betadine okay  . Sulfa Antibiotics Hives    Difficulty breathing  . Sulfasalazine Hives    Difficulty breathing  . Percocet [Oxycodone-Acetaminophen] Hives    Difficulty  breathing  . Iodinated Diagnostic Agents Itching  . Iodine Itching   Family History: Family History  Problem Relation Age of Onset  . Breast cancer Maternal Aunt 47  . Bladder Cancer Neg Hx   . Kidney cancer Neg Hx    Social History:   reports that she has been smoking cigarettes. She has been smoking about 1.00 pack per day. She has never used smokeless tobacco. She reports previous alcohol use. She reports that she does not use drugs.  ROS: UROLOGY Frequent Urination?: No Hard to postpone urination?: Yes Burning/pain with urination?: No Get up at night to urinate?: Yes Leakage of urine?: Yes Urine stream starts and stops?: No Trouble starting stream?: No Do you have to strain to urinate?: No Blood in urine?: No Urinary tract infection?: No Sexually transmitted disease?: No Injury to kidneys or bladder?: No Painful  intercourse?: No Weak stream?: No Currently pregnant?: No Vaginal bleeding?: No Last menstrual period?: n  Gastrointestinal Nausea?: No Vomiting?: No Indigestion/heartburn?: No Diarrhea?: No Constipation?: No  Constitutional Fever: No Night sweats?: No Weight loss?: No Fatigue?: No  Skin Skin rash/lesions?: No Itching?: No  Eyes Blurred vision?: No Double vision?: No  Ears/Nose/Throat Sore throat?: No Sinus problems?: No  Hematologic/Lymphatic Swollen glands?: No Easy bruising?: No  Cardiovascular Leg swelling?: No Chest pain?: No  Respiratory Cough?: No Shortness of breath?: No  Endocrine Excessive thirst?: No  Musculoskeletal Back pain?: No Joint pain?: No  Neurological Headaches?: No Dizziness?: No  Psychologic Depression?: No Anxiety?: No  Physical Exam: BP 113/71   Pulse 79   Ht 5\' 6"  (1.676 m)   Wt 179 lb (81.2 kg)   BMI 28.89 kg/m   Constitutional:  Alert and oriented, no acute distress, nontoxic appearing HEENT: [place], AT Cardiovascular: No clubbing, cyanosis, or edema Respiratory: Normal respiratory  effort, no increased work of breathing Skin: No rashes, bruises or suspicious lesions Neurologic: Grossly intact, no focal deficits, moving all 4 extremities Psychiatric: Normal mood and affect  Laboratory Data: Results for orders placed or performed in visit on 07/17/19  Bladder Scan (Post Void Residual) in office  Result Value Ref Range   Scan Result 63ml    Assessment & Plan:   1. OAB (overactive bladder) 73 year old female with a history of urinary urgency and urge incontinence here for follow-up on a trial of Myrbetriq 50 mg daily.  She reports significant symptom improvement on this medication.  Refill sent to her pharmacy.  I provided her with a cost savings coupon for this medication and information for the Myrbetriq support solutions line to reduce the cost of this medication.  If this medication continues to be cost prohibitive for her, may consider a trial of anticholinergics.  Recommend trospium given her age. - Bladder Scan (Post Void Residual) in office - mirabegron ER (MYRBETRIQ) 50 MG TB24 tablet; Take 1 tablet (50 mg total) by mouth daily.  Dispense: 90 tablet; Refill: 0  2. Spasm Pelvic "tremor" not associated with pain.  Most consistent with muscle spasm.  I do not believe this requires urgent intervention.  I suspect it will be temporary.  Contributory factors may include IBS-D, OAB, and endometrial polyp.  Will reevaluate at follow-up visit.  Return in about 3 months (around 10/17/2019) for PVR and OAB questionnaire.  Debroah Loop, PA-C  [facility] [address], [place], [place] [address] [phone]

## 2019-07-17 NOTE — Patient Instructions (Signed)
Myrbetriq is not yet available as a generic medication, so it can be quite costly ($400-$500 a month). For more information about reducing the cost of this medication, please contact the Myrbetriq Support Solutions line at 1-800-477-6472. This is a service provided by the makers of the drug; they can help explain how you can reduce the cost of this medication based on your particular circumstances. They are available Monday-Friday, 9:00 am-8:00 pm ET.  

## 2019-07-28 DIAGNOSIS — R9389 Abnormal findings on diagnostic imaging of other specified body structures: Secondary | ICD-10-CM | POA: Diagnosis not present

## 2019-07-28 DIAGNOSIS — N840 Polyp of corpus uteri: Secondary | ICD-10-CM | POA: Diagnosis not present

## 2019-08-27 ENCOUNTER — Other Ambulatory Visit: Payer: Self-pay | Admitting: Adult Health

## 2019-08-27 DIAGNOSIS — E119 Type 2 diabetes mellitus without complications: Secondary | ICD-10-CM

## 2019-09-04 ENCOUNTER — Telehealth: Payer: Self-pay

## 2019-09-04 DIAGNOSIS — K580 Irritable bowel syndrome with diarrhea: Secondary | ICD-10-CM | POA: Diagnosis not present

## 2019-09-04 NOTE — Telephone Encounter (Signed)
CONFIRMED AND SCREENED FOR 09-09-19 OV. °

## 2019-09-08 DIAGNOSIS — H26492 Other secondary cataract, left eye: Secondary | ICD-10-CM | POA: Diagnosis not present

## 2019-09-08 DIAGNOSIS — Z961 Presence of intraocular lens: Secondary | ICD-10-CM | POA: Diagnosis not present

## 2019-09-09 ENCOUNTER — Other Ambulatory Visit: Payer: Self-pay

## 2019-09-09 ENCOUNTER — Ambulatory Visit (INDEPENDENT_AMBULATORY_CARE_PROVIDER_SITE_OTHER): Payer: Medicare Other | Admitting: Nurse Practitioner

## 2019-09-09 ENCOUNTER — Encounter: Payer: Self-pay | Admitting: Nurse Practitioner

## 2019-09-09 VITALS — BP 124/58 | HR 76 | Temp 97.4°F | Resp 16 | Ht 66.0 in | Wt 186.0 lb

## 2019-09-09 DIAGNOSIS — R252 Cramp and spasm: Secondary | ICD-10-CM | POA: Diagnosis not present

## 2019-09-09 DIAGNOSIS — R0609 Other forms of dyspnea: Secondary | ICD-10-CM

## 2019-09-09 DIAGNOSIS — E1165 Type 2 diabetes mellitus with hyperglycemia: Secondary | ICD-10-CM | POA: Diagnosis not present

## 2019-09-09 DIAGNOSIS — R609 Edema, unspecified: Secondary | ICD-10-CM

## 2019-09-09 DIAGNOSIS — R0600 Dyspnea, unspecified: Secondary | ICD-10-CM | POA: Diagnosis not present

## 2019-09-09 LAB — POCT GLYCOSYLATED HEMOGLOBIN (HGB A1C): Hemoglobin A1C: 6.1 % — AB (ref 4.0–5.6)

## 2019-09-09 NOTE — Progress Notes (Signed)
[facility] [place], [place] [address]  Internal MEDICINE  Office Visit Note  Patient Name: Amber Holder  [id number]  [id number]  Date of Service: 09/10/2019  Chief Complaint  Patient presents with  . Diabetes  . Asthma  . Gastroesophageal Reflux    [name] presents today for a diabetes follow-up. Her HgbA1c is 6.1 today. Her previous two were 6.0 and 5.9. She reports that she checks her blood sugar in the morning and if it is 120 or less, she does not take her metformin. She had an eye exam yesterday and had to have fluid removal from behind her left eye. Her right eye is fine She has had some bladder and bowel incontinence. She has also had leg cramps, which kept her up every hour last night. She has been taking magnesium without any relief. She also reports that her legs are swelling, but that she has not taken Lasix "in a long time." She also reports some shortness of breath and dyspnea on exertion. She also states she has a great deal of fatigue. She has not been using her CPAP at night because she is concerned that it is not clean enough. Her HgbA1c is 6.1 today.       Current Medication: Outpatient Encounter Medications as of 09/09/2019  Medication Sig  . acetaminophen (TYLENOL) 500 MG chewable tablet Chew 1,000 mg by mouth every 8 (eight) hours as needed for pain.  Marland Kitchen albuterol (PROVENTIL HFA;VENTOLIN HFA) 108 (90 Base) MCG/ACT inhaler Inhale 2 puffs into the lungs every 6 (six) hours as needed for wheezing or shortness of breath.  . cholecalciferol (VITAMIN D) 400 UNITS TABS tablet Take 2,000 Units by mouth daily.   . cholestyramine (QUESTRAN) 4 g packet Take by mouth.  . ciprofloxacin-dexamethasone (CIPRODEX) OTIC suspension APPLY 3 4 DROPS TO RIGHT EAR 2 3 TIMES DAILY FOR 1 WEEK THEN USE AS NEEDED  . ergocalciferol (DRISDOL) 1.25 MG (50000 UT) capsule Take 1 capsule (50,000 Units total) by mouth once a week.  . furosemide (LASIX) 20 MG tablet  TAKE 1 TABLET BY MOUTH DAILY  . ibandronate (BONIVA) 150 MG tablet Take 1 tablet (150 mg total) by mouth every 30 (thirty) days.  . Magnesium 250 MG TABS Take by mouth daily.  . meloxicam (MOBIC) 7.5 MG tablet TAKE 1 TABLET (7.5 MG TOTAL) BY MOUTH 2 (TWO) TIMES A DAY.  . metFORMIN (GLUCOPHAGE) 500 MG tablet Take 0.5 tablets (250 mg total) by mouth daily with breakfast.  . mirabegron ER (MYRBETRIQ) 50 MG TB24 tablet Take 1 tablet (50 mg total) by mouth daily.  . montelukast (SINGULAIR) 10 MG tablet TAKE 1 TABLET BY MOUTH DAILY FOR ALLERGIES  . ONETOUCH VERIO test strip BLOOD SUGAR TESTING ONCE DAILY . DX E11.65  . pneumococcal 23 valent vaccine (PNEUMOVAX 23) 25 MCG/0.5ML injection Inject 0.33ml IM once  . potassium chloride (KLOR-CON) 8 MEQ tablet Take 8 mEq by mouth as needed (only take when taking Lasix).   Marland Kitchen tiotropium (SPIRIVA) 18 MCG inhalation capsule Place 1 capsule (18 mcg total) into inhaler and inhale daily.  . nitrofurantoin, macrocrystal-monohydrate, (MACROBID) 100 MG capsule Take 1 capsule (100 mg total) by mouth daily. (Patient not taking: Reported on 09/09/2019)   No facility-administered encounter medications on file as of 09/09/2019.    Surgical History: Past Surgical History:  Procedure Laterality Date  . APPENDECTOMY    . cataract surgery Bilateral   . CHOLECYSTECTOMY    . COLONOSCOPY    .  COLONOSCOPY WITH PROPOFOL N/A 05/28/2015   Procedure: COLONOSCOPY WITH PROPOFOL;  Surgeon: Lollie Sails, MD;  Location: [facility] ENDOSCOPY;  Service: Endoscopy;  Laterality: N/A;  . COLONOSCOPY WITH PROPOFOL N/A 10/28/2018   Procedure: COLONOSCOPY WITH PROPOFOL;  Surgeon: Lollie Sails, MD;  Location: [facility] ENDOSCOPY;  Service: Endoscopy;  Laterality: N/A;  . EYE SURGERY Bilateral 2015   Cataract Extraction with IOL  . LITHOTRIPSY  2015   has had many stones  . Mastoidotomy  1970  . ROTATOR CUFF REPAIR Right 2011  . TOTAL HIP ARTHROPLASTY Right 06/14/2016   Procedure: TOTAL HIP  ARTHROPLASTY;  Surgeon: Dereck Leep, MD;  Location: [facility] ORS;  Service: Orthopedics;  Laterality: Right;    Medical History: Past Medical History:  Diagnosis Date  . Anxiety 01/02/2012  . Arthritis   . Asthma   . Chronic kidney disease    Kidney stones  . Colon polyps   . COPD (chronic obstructive pulmonary disease) ([place])   . Diabetes mellitus without complication ([place])   . GERD (gastroesophageal reflux disease)   . HOH (hard of hearing)    Bilateral hearing aids  . Osteoporosis   . Sleep apnea    Does not use C-PAP on a regular basis  . Venous stasis     Family History: Family History  Problem Relation Age of Onset  . Breast cancer Maternal Aunt 67  . Bladder Cancer Neg Hx   . Kidney cancer Neg Hx     Social History   Socioeconomic History  . Marital status: Divorced    Spouse name: Not on file  . Number of children: Not on file  . Years of education: Not on file  . Highest education level: Not on file  Occupational History  . Not on file  Tobacco Use  . Smoking status: Current Every Day Smoker    Packs/day: 1.00    Types: Cigarettes  . Smokeless tobacco: Never Used  Substance and Sexual Activity  . Alcohol use: Not Currently  . Drug use: No  . Sexual activity: Not Currently  Other Topics Concern  . Not on file  Social History Narrative  . Not on file   Social Determinants of Health   Financial Resource Strain:   . Difficulty of Paying Living Expenses: Not on file  Food Insecurity:   . Worried About Charity fundraiser in the Last Year: Not on file  . Ran Out of Food in the Last Year: Not on file  Transportation Needs:   . Lack of Transportation (Medical): Not on file  . Lack of Transportation (Non-Medical): Not on file  Physical Activity:   . Days of Exercise per Week: Not on file  . Minutes of Exercise per Session: Not on file  Stress:   . Feeling of Stress : Not on file  Social Connections:   . Frequency of Communication with Friends and  Family: Not on file  . Frequency of Social Gatherings with Friends and Family: Not on file  . Attends Religious Services: Not on file  . Active Member of Clubs or Organizations: Not on file  . Attends Archivist Meetings: Not on file  . Marital Status: Not on file  Intimate Partner Violence:   . Fear of Current or Ex-Partner: Not on file  . Emotionally Abused: Not on file  . Physically Abused: Not on file  . Sexually Abused: Not on file      Review of Systems  Constitutional: Positive for  fatigue. Negative for activity change, chills and unexpected weight change.  HENT: Negative for congestion, nosebleeds, postnasal drip, rhinorrhea and voice change.   Eyes: Positive for visual disturbance.  Respiratory: Positive for shortness of breath. Negative for cough, chest tightness and wheezing.        Dyspnea on exertion  Cardiovascular: Positive for leg swelling. Negative for chest pain and palpitations.  Gastrointestinal: Positive for diarrhea. Negative for constipation, nausea and vomiting.       Has had episodes of bowel incontinence  Endocrine: Negative for cold intolerance, heat intolerance, polydipsia and polyuria.       Blood sugars doing well   Genitourinary: Negative for difficulty urinating, dysuria, flank pain, frequency and urgency.       Has had episodes of urinary incontinence  Musculoskeletal: Positive for back pain.  Skin: Negative for rash.  Allergic/Immunologic: Positive for environmental allergies.  Neurological: Negative for dizziness and headaches.  Hematological: Negative for adenopathy.  Psychiatric/Behavioral: Negative for behavioral problems, dysphoric mood and sleep disturbance. The patient is not nervous/anxious.     Today's Vitals   09/09/19 1155  BP: (!) 124/58  Pulse: 76  Resp: 16  Temp: (!) 97.4 F (36.3 C)  SpO2: 95%  Weight: 186 lb (84.4 kg)  Height: 5\' 6"  (1.676 m)   Body mass index is 30.02 kg/m.  Physical Exam Vitals and  nursing note reviewed.  Constitutional:      General: She is not in acute distress.    Appearance: Normal appearance.  HENT:     Head: Normocephalic and atraumatic.     Nose: Nose normal.  Eyes:     Pupils: Pupils are equal, round, and reactive to light.  Neck:     Vascular: No carotid bruit.  Cardiovascular:     Rate and Rhythm: Normal rate and regular rhythm.     Pulses: Normal pulses.     Heart sounds: Normal heart sounds.     Comments: B/l UE 2+ non-pitting edema Pulmonary:     Effort: Pulmonary effort is normal.     Breath sounds: Normal breath sounds.  Abdominal:     Palpations: Abdomen is soft.  Musculoskeletal:     Cervical back: Normal range of motion and neck supple.     Right lower leg: 2+ Edema present.     Left lower leg: 2+ Edema present.  Skin:    Capillary Refill: Capillary refill takes less than 2 seconds.  Neurological:     Mental Status: She is alert.  Psychiatric:        Mood and Affect: Mood normal.        Behavior: Behavior normal.        Thought Content: Thought content normal.        Judgment: Judgment normal.    Assessment/Plan: 1. Uncontrolled type 2 diabetes mellitus with hyperglycemia (HCC) - POCT HgB A1C 61 today. Continue diabetic medication as prescribed   2. Peripheral edema Echocardiogram ordered for further evaluation. Advised she take her prescribed lasix as needed.  - ECHOCARDIOGRAM COMPLETE; Future  3. Dyspnea on exertion Echocardiogram ordered for further evaluation.  - ECHOCARDIOGRAM COMPLETE; Future  4. Muscle cramps Check labs including magnesium and phosphorus levels.   General Counseling: Prisila verbalizes understanding of the findings of todays visit and agrees with plan of treatment. I have discussed any further diagnostic evaluation that may be needed or ordered today. We also reviewed her medications today. she has been encouraged to call the office with any questions or concerns  that should arise related to todays  visit.  Cardiac risk factor modification:  1. Control blood pressure. 2. Exercise as prescribed. 3. Follow low sodium, low fat diet. and low fat and low cholestrol diet. 4. Take ASA 81mg  once a day. 5. Restricted calories diet to lose weight.  This patient was seen by Leretha Pol FNP Collaboration with Dr Lavera Guise as a part of collaborative care agreement  Orders Placed This Encounter  Procedures  . POCT HgB A1C  . ECHOCARDIOGRAM COMPLETE      Total time spent: 30 Minutes  Time spent includes review of chart, medications, test results, and follow up plan with the patient.      Dr Lavera Guise Internal medicine

## 2019-09-10 DIAGNOSIS — R252 Cramp and spasm: Secondary | ICD-10-CM | POA: Insufficient documentation

## 2019-09-10 DIAGNOSIS — R609 Edema, unspecified: Secondary | ICD-10-CM | POA: Insufficient documentation

## 2019-09-10 DIAGNOSIS — R0609 Other forms of dyspnea: Secondary | ICD-10-CM | POA: Insufficient documentation

## 2019-09-10 DIAGNOSIS — R600 Localized edema: Secondary | ICD-10-CM | POA: Insufficient documentation

## 2019-09-11 ENCOUNTER — Other Ambulatory Visit: Payer: Self-pay | Admitting: Nurse Practitioner

## 2019-09-11 DIAGNOSIS — E119 Type 2 diabetes mellitus without complications: Secondary | ICD-10-CM | POA: Diagnosis not present

## 2019-09-11 DIAGNOSIS — Z1159 Encounter for screening for other viral diseases: Secondary | ICD-10-CM | POA: Diagnosis not present

## 2019-09-11 DIAGNOSIS — I10 Essential (primary) hypertension: Secondary | ICD-10-CM | POA: Diagnosis not present

## 2019-09-11 DIAGNOSIS — E559 Vitamin D deficiency, unspecified: Secondary | ICD-10-CM | POA: Diagnosis not present

## 2019-09-11 DIAGNOSIS — R252 Cramp and spasm: Secondary | ICD-10-CM | POA: Diagnosis not present

## 2019-09-12 LAB — LIPID PANEL W/O CHOL/HDL RATIO
Cholesterol, Total: 239 mg/dL — ABNORMAL HIGH (ref 100–199)
HDL: 55 mg/dL (ref >39)
LDL Chol Calc (NIH): 153 mg/dL — ABNORMAL HIGH (ref 0–99)
Triglycerides: 170 mg/dL — ABNORMAL HIGH (ref 0–149)
VLDL Cholesterol Cal: 31 mg/dL (ref 5–40)

## 2019-09-12 LAB — COMPREHENSIVE METABOLIC PANEL
ALT: 13 IU/L (ref 0–32)
AST: 15 IU/L (ref 0–40)
Albumin/Globulin Ratio: 1.9 (ref 1.2–2.2)
Albumin: 4.2 g/dL (ref 3.7–4.7)
Alkaline Phosphatase: 62 IU/L (ref 39–117)
BUN/Creatinine Ratio: 23 (ref 12–28)
BUN: 14 mg/dL (ref 8–27)
Bilirubin Total: 0.4 mg/dL (ref 0.0–1.2)
CO2: 24 mmol/L (ref 20–29)
Calcium: 9.7 mg/dL (ref 8.7–10.3)
Chloride: 104 mmol/L (ref 96–106)
Creatinine, Ser: 0.6 mg/dL (ref 0.57–1.00)
GFR calc Af Amer: 104 mL/min/{1.73_m2} (ref >59)
GFR calc non Af Amer: 90 mL/min/{1.73_m2} (ref >59)
Globulin, Total: 2.2 g/dL (ref 1.5–4.5)
Glucose: 96 mg/dL (ref 65–99)
Potassium: 4.7 mmol/L (ref 3.5–5.2)
Sodium: 141 mmol/L (ref 134–144)
Total Protein: 6.4 g/dL (ref 6.0–8.5)

## 2019-09-12 LAB — MAGNESIUM: Magnesium: 2.1 mg/dL (ref 1.6–2.3)

## 2019-09-12 LAB — CBC
Hematocrit: 44.4 % (ref 34.0–46.6)
Hemoglobin: 15 g/dL (ref 11.1–15.9)
MCH: 29.6 pg (ref 26.6–33.0)
MCHC: 33.8 g/dL (ref 31.5–35.7)
MCV: 88 fL (ref 79–97)
Platelets: 240 10*3/uL (ref 150–450)
RBC: 5.06 x10E6/uL (ref 3.77–5.28)
RDW: 13.1 % (ref 11.7–15.4)
WBC: 8 10*3/uL (ref 3.4–10.8)

## 2019-09-12 LAB — T4, FREE: Free T4: 1.15 ng/dL (ref 0.82–1.77)

## 2019-09-12 LAB — HEPATITIS C ANTIBODY (REFLEX): HCV Ab: <0.1 s/co ratio (ref 0.0–0.9)

## 2019-09-12 LAB — HCV COMMENT:

## 2019-09-12 LAB — TSH: TSH: 1.61 u[IU]/mL (ref 0.450–4.500)

## 2019-09-12 LAB — VITAMIN D 25 HYDROXY (VIT D DEFICIENCY, FRACTURES): Vit D, 25-Hydroxy: 24.7 ng/mL — ABNORMAL LOW (ref 30.0–100.0)

## 2019-09-12 LAB — PROLACTIN: Prolactin: 4.5 ng/mL — ABNORMAL LOW (ref 4.8–23.3)

## 2019-09-15 NOTE — Progress Notes (Signed)
Called [facility] and add phosphorus

## 2019-09-15 NOTE — Progress Notes (Signed)
Hey. The patient had phosphorus level checked and they resulted prolactin. Is there anyway they could cancel this and do the phosphorus level? Thanks.

## 2019-09-16 LAB — PHOSPHORUS: Phosphorus: 3.7 mg/dL (ref 3.0–4.3)

## 2019-09-16 LAB — SPECIMEN STATUS REPORT

## 2019-09-19 ENCOUNTER — Telehealth: Payer: Self-pay

## 2019-09-19 NOTE — Telephone Encounter (Signed)
We need to find out more in depth information about the covid vaccines. Is an allergy to contrast dye a contraindication.

## 2019-09-24 ENCOUNTER — Telehealth: Payer: Self-pay

## 2019-09-24 NOTE — Telephone Encounter (Signed)
Pt advised call health department for more information for covid vaccine and we gave her website for register

## 2019-09-26 ENCOUNTER — Other Ambulatory Visit: Payer: Self-pay

## 2019-09-26 ENCOUNTER — Ambulatory Visit: Payer: Medicare Other

## 2019-09-26 ENCOUNTER — Telehealth: Payer: Self-pay

## 2019-09-26 DIAGNOSIS — R0602 Shortness of breath: Secondary | ICD-10-CM | POA: Diagnosis not present

## 2019-09-26 DIAGNOSIS — R609 Edema, unspecified: Secondary | ICD-10-CM

## 2019-09-26 DIAGNOSIS — R0609 Other forms of dyspnea: Secondary | ICD-10-CM

## 2019-09-26 NOTE — Telephone Encounter (Signed)
lmom for patient to confirm 09-30-19 ov as virtual. 

## 2019-09-29 NOTE — Progress Notes (Signed)
Diastolic dysfunction. Otherwise normal. Has OSA and is smoker. Discuss at visit 2/2

## 2019-09-30 ENCOUNTER — Encounter: Payer: Self-pay | Admitting: Nurse Practitioner

## 2019-09-30 ENCOUNTER — Ambulatory Visit (INDEPENDENT_AMBULATORY_CARE_PROVIDER_SITE_OTHER): Payer: Medicare Other | Admitting: Nurse Practitioner

## 2019-09-30 VITALS — Ht 66.0 in | Wt 175.0 lb

## 2019-09-30 DIAGNOSIS — I5189 Other ill-defined heart diseases: Secondary | ICD-10-CM | POA: Insufficient documentation

## 2019-09-30 DIAGNOSIS — E1165 Type 2 diabetes mellitus with hyperglycemia: Secondary | ICD-10-CM | POA: Diagnosis not present

## 2019-09-30 DIAGNOSIS — G4733 Obstructive sleep apnea (adult) (pediatric): Secondary | ICD-10-CM | POA: Diagnosis not present

## 2019-09-30 DIAGNOSIS — J449 Chronic obstructive pulmonary disease, unspecified: Secondary | ICD-10-CM

## 2019-09-30 DIAGNOSIS — Z9989 Dependence on other enabling machines and devices: Secondary | ICD-10-CM | POA: Diagnosis not present

## 2019-09-30 NOTE — Progress Notes (Signed)
[facility] [place], [place] [address]  Internal MEDICINE  Telephone Visit  Patient Name: Amber Holder  [id number]  [id number]  Date of Service: 09/30/2019  I connected with the patient at 10:28am by webcam and verified the patients identity using two identifiers.   I discussed the limitations, risks, security and privacy concerns of performing an evaluation and management service by webcam and the availability of in person appointments. I also discussed with the patient that there may be a patient responsible charge related to the service.  The patient expressed understanding and agrees to proceed.    Chief Complaint  Patient presents with  . Telephone Assessment     need information about covid vaccine   . Telephone Screen  . Diabetes  . Gastroesophageal Reflux  . Follow-up    Echo    The patient has been contacted via webcam for follow up visit due to concerns for spread of novel coronavirus. The patient presents for routine follow up visit. She had echocardiogram and labs done since her last visit. Her echo shows that she has diastolic dysfunction. She has sleep apnea. States that she has not been using CPAP in some time. Has appointment next week with Dr. Devona Konig regarding her CPAP and she will discuss her problems with the mask with him. She does feel tired quite frequency. This is most severe after eating. Feels like she needs a nap for 2 to 3 hours. Labs indicate generalized elevation of Lipid panel. LDL and total cholesterol are improved from last year. Triglycerides are a bit more elevated.       Current Medication: Outpatient Encounter Medications as of 09/30/2019  Medication Sig  . acetaminophen (TYLENOL) 500 MG chewable tablet Chew 1,000 mg by mouth every 8 (eight) hours as needed for pain.  Marland Kitchen albuterol (PROVENTIL HFA;VENTOLIN HFA) 108 (90 Base) MCG/ACT inhaler Inhale 2 puffs into the lungs every 6 (six) hours as needed for wheezing or  shortness of breath.  . cholecalciferol (VITAMIN D) 400 UNITS TABS tablet Take 2,000 Units by mouth daily.   . cholestyramine (QUESTRAN) 4 g packet Take by mouth.  . ciprofloxacin-dexamethasone (CIPRODEX) OTIC suspension APPLY 3 4 DROPS TO RIGHT EAR 2 3 TIMES DAILY FOR 1 WEEK THEN USE AS NEEDED  . ergocalciferol (DRISDOL) 1.25 MG (50000 UT) capsule Take 1 capsule (50,000 Units total) by mouth once a week.  . furosemide (LASIX) 20 MG tablet TAKE 1 TABLET BY MOUTH DAILY  . ibandronate (BONIVA) 150 MG tablet Take 1 tablet (150 mg total) by mouth every 30 (thirty) days.  . Magnesium 250 MG TABS Take by mouth daily.  . meloxicam (MOBIC) 7.5 MG tablet TAKE 1 TABLET (7.5 MG TOTAL) BY MOUTH 2 (TWO) TIMES A DAY.  . metFORMIN (GLUCOPHAGE) 500 MG tablet Take 0.5 tablets (250 mg total) by mouth daily with breakfast.  . mirabegron ER (MYRBETRIQ) 50 MG TB24 tablet Take 1 tablet (50 mg total) by mouth daily.  . montelukast (SINGULAIR) 10 MG tablet TAKE 1 TABLET BY MOUTH DAILY FOR ALLERGIES  . nitrofurantoin, macrocrystal-monohydrate, (MACROBID) 100 MG capsule Take 1 capsule (100 mg total) by mouth daily.  Glory Rosebush VERIO test strip BLOOD SUGAR TESTING ONCE DAILY . DX E11.65  . pneumococcal 23 valent vaccine (PNEUMOVAX 23) 25 MCG/0.5ML injection Inject 0.25ml IM once  . potassium chloride (KLOR-CON) 8 MEQ tablet Take 8 mEq by mouth as needed (only take when taking Lasix).   Marland Kitchen tiotropium (SPIRIVA) 18 MCG inhalation capsule Place  1 capsule (18 mcg total) into inhaler and inhale daily.   No facility-administered encounter medications on file as of 09/30/2019.    Surgical History: Past Surgical History:  Procedure Laterality Date  . APPENDECTOMY    . cataract surgery Bilateral   . CHOLECYSTECTOMY    . COLONOSCOPY    . COLONOSCOPY WITH PROPOFOL N/A 05/28/2015   Procedure: COLONOSCOPY WITH PROPOFOL;  Surgeon: Lollie Sails, MD;  Location: [facility] ENDOSCOPY;  Service: Endoscopy;  Laterality: N/A;  . COLONOSCOPY  WITH PROPOFOL N/A 10/28/2018   Procedure: COLONOSCOPY WITH PROPOFOL;  Surgeon: Lollie Sails, MD;  Location: [facility] ENDOSCOPY;  Service: Endoscopy;  Laterality: N/A;  . EYE SURGERY Bilateral 2015   Cataract Extraction with IOL  . LITHOTRIPSY  2015   has had many stones  . Mastoidotomy  1970  . ROTATOR CUFF REPAIR Right 2011  . TOTAL HIP ARTHROPLASTY Right 06/14/2016   Procedure: TOTAL HIP ARTHROPLASTY;  Surgeon: Dereck Leep, MD;  Location: [facility] ORS;  Service: Orthopedics;  Laterality: Right;    Medical History: Past Medical History:  Diagnosis Date  . Anxiety 01/02/2012  . Arthritis   . Asthma   . Chronic kidney disease    Kidney stones  . Colon polyps   . COPD (chronic obstructive pulmonary disease) ([place])   . Diabetes mellitus without complication ([place])   . GERD (gastroesophageal reflux disease)   . HOH (hard of hearing)    Bilateral hearing aids  . Osteoporosis   . Sleep apnea    Does not use C-PAP on a regular basis  . Venous stasis     Family History: Family History  Problem Relation Age of Onset  . Breast cancer Maternal Aunt 33  . Bladder Cancer Neg Hx   . Kidney cancer Neg Hx     Social History   Socioeconomic History  . Marital status: Divorced    Spouse name: Not on file  . Number of children: Not on file  . Years of education: Not on file  . Highest education level: Not on file  Occupational History  . Not on file  Tobacco Use  . Smoking status: Current Every Day Smoker    Packs/day: 1.00    Types: Cigarettes  . Smokeless tobacco: Never Used  Substance and Sexual Activity  . Alcohol use: Not Currently  . Drug use: No  . Sexual activity: Not Currently  Other Topics Concern  . Not on file  Social History Narrative  . Not on file   Social Determinants of Health   Financial Resource Strain:   . Difficulty of Paying Living Expenses: Not on file  Food Insecurity:   . Worried About Charity fundraiser in the Last Year: Not on file  . Ran Out  of Food in the Last Year: Not on file  Transportation Needs:   . Lack of Transportation (Medical): Not on file  . Lack of Transportation (Non-Medical): Not on file  Physical Activity:   . Days of Exercise per Week: Not on file  . Minutes of Exercise per Session: Not on file  Stress:   . Feeling of Stress : Not on file  Social Connections:   . Frequency of Communication with Friends and Family: Not on file  . Frequency of Social Gatherings with Friends and Family: Not on file  . Attends Religious Services: Not on file  . Active Member of Clubs or Organizations: Not on file  . Attends Archivist Meetings: Not on  file  . Marital Status: Not on file  Intimate Partner Violence:   . Fear of Current or Ex-Partner: Not on file  . Emotionally Abused: Not on file  . Physically Abused: Not on file  . Sexually Abused: Not on file      Review of Systems  Constitutional: Positive for fatigue. Negative for activity change, chills and unexpected weight change.  HENT: Negative for congestion, nosebleeds, postnasal drip, rhinorrhea and voice change.   Eyes: Positive for visual disturbance.  Respiratory: Positive for shortness of breath. Negative for cough, chest tightness and wheezing.        Dyspnea on exertion  Cardiovascular: Positive for leg swelling. Negative for chest pain and palpitations.  Gastrointestinal: Negative for constipation, diarrhea, nausea and vomiting.  Endocrine: Negative for cold intolerance, heat intolerance, polydipsia and polyuria.       Blood sugars doing well   Musculoskeletal: Positive for back pain.  Skin: Negative for rash.  Allergic/Immunologic: Positive for environmental allergies.  Neurological: Negative for dizziness and headaches.  Hematological: Negative for adenopathy.  Psychiatric/Behavioral: Negative for behavioral problems, dysphoric mood and sleep disturbance. The patient is not nervous/anxious.     Today's Vitals   09/30/19 0944  Weight:  175 lb (79.4 kg)  Height: 5\' 6"  (1.676 m)   Body mass index is 28.25 kg/m.  Observation/Objective:   The patient is alert and oriented. She is pleasant and answers all questions appropriately. Breathing is non-labored. She is in no acute distress at this time.    Assessment/Plan: 1. Diastolic dysfunction Reviewed results of echocardiogram with patient. She has diastolic dysfunction. She is diagnosed with sleep apnea but does not use CPAP  2. OSA on CPAP Has appointment next week with Dr. Devona Konig to discuss issues with CPAP. Recommended she begin to wear mask again as soon as possible.   3. Chronic obstructive pulmonary disease, unspecified COPD type ([place]) Stable. Continue inhalers as prescribed  4. Type 2 diabetes mellitus with hyperglycemia, without long-term current use of insulin ([place]) Continue diabetic medication as prescribed.   General Counseling: Valia verbalizes understanding of the findings of today's phone visit and agrees with plan of treatment. I have discussed any further diagnostic evaluation that may be needed or ordered today. We also reviewed her medications today. she has been encouraged to call the office with any questions or concerns that should arise related to todays visit.   This patient was seen by Leretha Pol FNP Collaboration with Dr Lavera Guise as a part of collaborative care agreement   Time spent: 20 Minutes   Time spent with patient included reviewing progress notes, labs, imaging studies, and discussing plan for follow up.    Dr Lavera Guise Internal medicine

## 2019-10-07 ENCOUNTER — Telehealth: Payer: Self-pay

## 2019-10-07 NOTE — Telephone Encounter (Signed)
Called confirmed appointment on 10/09/2019 and screened for covid.  klh

## 2019-10-09 ENCOUNTER — Other Ambulatory Visit: Payer: Self-pay

## 2019-10-09 ENCOUNTER — Ambulatory Visit (INDEPENDENT_AMBULATORY_CARE_PROVIDER_SITE_OTHER): Payer: Medicare Other | Admitting: Internal Medicine

## 2019-10-09 ENCOUNTER — Encounter: Payer: Self-pay | Admitting: Internal Medicine

## 2019-10-09 VITALS — BP 135/64 | HR 73 | Temp 97.3°F | Resp 16 | Ht 66.0 in | Wt 187.0 lb

## 2019-10-09 DIAGNOSIS — R0602 Shortness of breath: Secondary | ICD-10-CM

## 2019-10-09 DIAGNOSIS — Z9989 Dependence on other enabling machines and devices: Secondary | ICD-10-CM | POA: Diagnosis not present

## 2019-10-09 DIAGNOSIS — G4733 Obstructive sleep apnea (adult) (pediatric): Secondary | ICD-10-CM

## 2019-10-09 DIAGNOSIS — J449 Chronic obstructive pulmonary disease, unspecified: Secondary | ICD-10-CM | POA: Diagnosis not present

## 2019-10-09 DIAGNOSIS — J302 Other seasonal allergic rhinitis: Secondary | ICD-10-CM

## 2019-10-09 NOTE — Progress Notes (Signed)
[facility] [place], [place] [address]  Pulmonary Sleep Medicine   Office Visit Note  Patient Name: Amber Holder DOB: 05/05/1946 MRN [id number]  Date of Service: 10/09/2019  Complaints/HPI: Patient is here for routine pulmonary follow up. She has had OSA and put on CPAP many years ago. She has not been using her CPAP in many years but the last 3 nights she has been trying to wear it. She has been feeling very tired during the day and sleeping most of the day. We discussed possible CPAP titration study but at this time would like to see her compliance increase before repeating titration study. She states she has been inactive since the [place] pandemic, she attributes this also to the season and not being able to be outside and as active during warmer weather. Denies shortness of breath, cough or chest pain.  ROS  General: (-) fever, (-) chills, (-) night sweats, (-) weakness Skin: (-) rashes, (-) itching,. Eyes: (-) visual changes, (-) redness, (-) itching. Nose and Sinuses: (-) nasal stuffiness or itchiness, (-) postnasal drip, (-) nosebleeds, (-) sinus trouble. Mouth and Throat: (-) sore throat, (-) hoarseness. Neck: (-) swollen glands, (-) enlarged thyroid, (-) neck pain. Respiratory: - cough, (-) bloody sputum, - shortness of breath, - wheezing. Cardiovascular: - ankle swelling, (-) chest pain. Lymphatic: (-) lymph node enlargement. Neurologic: (-) numbness, (-) tingling. Psychiatric: (-) anxiety, (-) depression   Current Medication: Outpatient Encounter Medications as of 10/09/2019  Medication Sig  . acetaminophen (TYLENOL) 500 MG chewable tablet Chew 1,000 mg by mouth every 8 (eight) hours as needed for pain.  Marland Kitchen albuterol (PROVENTIL HFA;VENTOLIN HFA) 108 (90 Base) MCG/ACT inhaler Inhale 2 puffs into the lungs every 6 (six) hours as needed for wheezing or shortness of breath.  . cholecalciferol (VITAMIN D) 400 UNITS TABS tablet Take 2,000 Units by  mouth daily.   . cholestyramine (QUESTRAN) 4 g packet Take by mouth.  . ciprofloxacin-dexamethasone (CIPRODEX) OTIC suspension APPLY 3 4 DROPS TO RIGHT EAR 2 3 TIMES DAILY FOR 1 WEEK THEN USE AS NEEDED  . ergocalciferol (DRISDOL) 1.25 MG (50000 UT) capsule Take 1 capsule (50,000 Units total) by mouth once a week.  . furosemide (LASIX) 20 MG tablet TAKE 1 TABLET BY MOUTH DAILY  . ibandronate (BONIVA) 150 MG tablet Take 1 tablet (150 mg total) by mouth every 30 (thirty) days.  . Magnesium 250 MG TABS Take by mouth daily.  . meloxicam (MOBIC) 7.5 MG tablet TAKE 1 TABLET (7.5 MG TOTAL) BY MOUTH 2 (TWO) TIMES A DAY.  . metFORMIN (GLUCOPHAGE) 500 MG tablet Take 0.5 tablets (250 mg total) by mouth daily with breakfast.  . mirabegron ER (MYRBETRIQ) 50 MG TB24 tablet Take 1 tablet (50 mg total) by mouth daily.  . montelukast (SINGULAIR) 10 MG tablet TAKE 1 TABLET BY MOUTH DAILY FOR ALLERGIES  . nitrofurantoin, macrocrystal-monohydrate, (MACROBID) 100 MG capsule Take 1 capsule (100 mg total) by mouth daily.  Glory Rosebush VERIO test strip BLOOD SUGAR TESTING ONCE DAILY . DX E11.65  . pneumococcal 23 valent vaccine (PNEUMOVAX 23) 25 MCG/0.5ML injection Inject 0.57ml IM once  . potassium chloride (KLOR-CON) 8 MEQ tablet Take 8 mEq by mouth as needed (only take when taking Lasix).   Marland Kitchen tiotropium (SPIRIVA) 18 MCG inhalation capsule Place 1 capsule (18 mcg total) into inhaler and inhale daily.   No facility-administered encounter medications on file as of 10/09/2019.    Surgical History: Past Surgical History:  Procedure Laterality Date  .  APPENDECTOMY    . cataract surgery Bilateral   . CHOLECYSTECTOMY    . COLONOSCOPY    . COLONOSCOPY WITH PROPOFOL N/A 05/28/2015   Procedure: COLONOSCOPY WITH PROPOFOL;  Surgeon: Lollie Sails, MD;  Location: [facility] ENDOSCOPY;  Service: Endoscopy;  Laterality: N/A;  . COLONOSCOPY WITH PROPOFOL N/A 10/28/2018   Procedure: COLONOSCOPY WITH PROPOFOL;  Surgeon: Lollie Sails, MD;  Location: [facility] ENDOSCOPY;  Service: Endoscopy;  Laterality: N/A;  . EYE SURGERY Bilateral 2015   Cataract Extraction with IOL  . LITHOTRIPSY  2015   has had many stones  . Mastoidotomy  1970  . ROTATOR CUFF REPAIR Right 2011  . TOTAL HIP ARTHROPLASTY Right 06/14/2016   Procedure: TOTAL HIP ARTHROPLASTY;  Surgeon: Dereck Leep, MD;  Location: [facility] ORS;  Service: Orthopedics;  Laterality: Right;    Medical History: Past Medical History:  Diagnosis Date  . Anxiety 01/02/2012  . Arthritis   . Asthma   . Chronic kidney disease    Kidney stones  . Colon polyps   . COPD (chronic obstructive pulmonary disease) ([place])   . Diabetes mellitus without complication ([place])   . GERD (gastroesophageal reflux disease)   . HOH (hard of hearing)    Bilateral hearing aids  . Osteoporosis   . Sleep apnea    Does not use C-PAP on a regular basis  . Venous stasis     Family History: Family History  Problem Relation Age of Onset  . Breast cancer Maternal Aunt 67  . Bladder Cancer Neg Hx   . Kidney cancer Neg Hx     Social History: Social History   Socioeconomic History  . Marital status: Divorced    Spouse name: Not on file  . Number of children: Not on file  . Years of education: Not on file  . Highest education level: Not on file  Occupational History  . Not on file  Tobacco Use  . Smoking status: Current Every Day Smoker    Packs/day: 1.00    Types: Cigarettes  . Smokeless tobacco: Never Used  Substance and Sexual Activity  . Alcohol use: Not Currently  . Drug use: No  . Sexual activity: Not Currently  Other Topics Concern  . Not on file  Social History Narrative  . Not on file   Social Determinants of Health   Financial Resource Strain:   . Difficulty of Paying Living Expenses: Not on file  Food Insecurity:   . Worried About Charity fundraiser in the Last Year: Not on file  . Ran Out of Food in the Last Year: Not on file  Transportation Needs:   . Lack  of Transportation (Medical): Not on file  . Lack of Transportation (Non-Medical): Not on file  Physical Activity:   . Days of Exercise per Week: Not on file  . Minutes of Exercise per Session: Not on file  Stress:   . Feeling of Stress : Not on file  Social Connections:   . Frequency of Communication with Friends and Family: Not on file  . Frequency of Social Gatherings with Friends and Family: Not on file  . Attends Religious Services: Not on file  . Active Member of Clubs or Organizations: Not on file  . Attends Archivist Meetings: Not on file  . Marital Status: Not on file  Intimate Partner Violence:   . Fear of Current or Ex-Partner: Not on file  . Emotionally Abused: Not on file  . Physically  Abused: Not on file  . Sexually Abused: Not on file    Vital Signs: Blood pressure 135/64, pulse 73, temperature (!) 97.3 F (36.3 C), resp. rate 16, height 5\' 6"  (1.676 m), weight 187 lb (84.8 kg), SpO2 96 %.  Examination: General Appearance: The patient is well-developed, well-nourished, and in no distress. Skin: Gross inspection of skin unremarkable. Head: normocephalic, no gross deformities. Eyes: no gross deformities noted. ENT: ears appear grossly normal no exudates. Neck: Supple. No thyromegaly. No LAD. Respiratory: Clear lung sounds bilateral. Cardiovascular: Normal S1 and S2 without murmur or rub. Extremities: No cyanosis. pulses are equal. Neurologic: Alert and oriented. No involuntary movements.  LABS: Recent Results (from the past 2160 hour(s))  Bladder Scan (Post Void Residual) in office     Status: None   Collection Time: 07/17/19 11:24 AM  Result Value Ref Range   Scan Result 57ml   POCT HgB A1C     Status: Abnormal   Collection Time: 09/09/19 12:11 PM  Result Value Ref Range   Hemoglobin A1C 6.1 (A) 4.0 - 5.6 %   HbA1c POC (<> result, manual entry)     HbA1c, POC (prediabetic range)     HbA1c, POC (controlled diabetic range)    Comprehensive  metabolic panel     Status: None   Collection Time: 09/11/19 10:35 AM  Result Value Ref Range   Glucose 96 65 - 99 mg/dL   BUN 14 8 - 27 mg/dL   Creatinine, Ser 0.60 0.57 - 1.00 mg/dL   GFR calc non Af Amer 90 >59 mL/min/1.73   GFR calc Af Amer 104 >59 mL/min/1.73   BUN/Creatinine Ratio 23 12 - 28   Sodium 141 134 - 144 mmol/L   Potassium 4.7 3.5 - 5.2 mmol/L   Chloride 104 96 - 106 mmol/L   CO2 24 20 - 29 mmol/L   Calcium 9.7 8.7 - 10.3 mg/dL   Total Protein 6.4 6.0 - 8.5 g/dL   Albumin 4.2 3.7 - 4.7 g/dL   Globulin, Total 2.2 1.5 - 4.5 g/dL   Albumin/Globulin Ratio 1.9 1.2 - 2.2   Bilirubin Total 0.4 0.0 - 1.2 mg/dL   Alkaline Phosphatase 62 39 - 117 IU/L   AST 15 0 - 40 IU/L   ALT 13 0 - 32 IU/L  CBC     Status: None   Collection Time: 09/11/19 10:35 AM  Result Value Ref Range   WBC 8.0 3.4 - 10.8 x10E3/uL   RBC 5.06 3.77 - 5.28 x10E6/uL   Hemoglobin 15.0 11.1 - 15.9 g/dL   Hematocrit 44.4 34.0 - 46.6 %   MCV 88 79 - 97 fL   MCH 29.6 26.6 - 33.0 pg   MCHC 33.8 31.5 - 35.7 g/dL   RDW 13.1 11.7 - 15.4 %   Platelets 240 150 - 450 x10E3/uL  Lipid Panel w/o Chol/HDL Ratio     Status: Abnormal   Collection Time: 09/11/19 10:35 AM  Result Value Ref Range   Cholesterol, Total 239 (H) 100 - 199 mg/dL   Triglycerides 170 (H) 0 - 149 mg/dL   HDL 55 >39 mg/dL   VLDL Cholesterol Cal 31 5 - 40 mg/dL   LDL Chol Calc (NIH) 153 (H) 0 - 99 mg/dL  T4, free     Status: None   Collection Time: 09/11/19 10:35 AM  Result Value Ref Range   Free T4 1.15 0.82 - 1.77 ng/dL  TSH     Status: None   Collection Time:  09/11/19 10:35 AM  Result Value Ref Range   TSH 1.610 0.450 - 4.500 uIU/mL  Prolactin     Status: Abnormal   Collection Time: 09/11/19 10:35 AM  Result Value Ref Range   Prolactin 4.5 (L) 4.8 - 23.3 ng/mL  VITAMIN D 25 Hydroxy (Vit-D Deficiency, Fractures)     Status: Abnormal   Collection Time: 09/11/19 10:35 AM  Result Value Ref Range   Vit D, 25-Hydroxy 24.7 (L) 30.0 -  100.0 ng/mL    Comment: Vitamin D deficiency has been defined by the [place] practice guideline as a level of serum 25-OH vitamin D less than 20 ng/mL (1,2). The Endocrine Society went on to further define vitamin D insufficiency as a level between 21 and 29 ng/mL (2). 1. IOM (Institute of Medicine). 2010. Dietary reference    intakes for calcium and D. [place]: The    [employer]. 2. Holick MF, Binkley [place], Bischoff-Ferrari HA, et al.    Evaluation, treatment, and prevention of vitamin D    deficiency: an Endocrine Society clinical practice    guideline. JCEM. 2011 Jul; 96(7):1911-30.   Hepatitis c antibody (reflex)     Status: None   Collection Time: 09/11/19 10:35 AM  Result Value Ref Range   HCV Ab <0.1 0.0 - 0.9 s/co ratio  HCV Comment:     Status: None   Collection Time: 09/11/19 10:35 AM  Result Value Ref Range   Comment: Comment     Comment: Non reactive HCV antibody screen is consistent with no HCV infection, unless recent infection is suspected or other evidence exists to indicate HCV infection.   Magnesium     Status: None   Collection Time: 09/11/19 10:35 AM  Result Value Ref Range   Magnesium 2.1 1.6 - 2.3 mg/dL  Phosphorus     Status: None   Collection Time: 09/11/19 10:35 AM  Result Value Ref Range   Phosphorus 3.7 3.0 - 4.3 mg/dL  Specimen status report     Status: None   Collection Time: 09/11/19 10:35 AM  Result Value Ref Range   specimen status report Comment     Comment: Written Authorization Written Authorization Written Authorization Received. Authorization received from [place] 09-15-2019 Logged by Denita Lung     Radiology: US PELVIC COMPLETE WITH TRANSVAGINAL  Result Date: 05/22/2019 CLINICAL DATA:  Suprapubic pain for 1 year, postmenopausal EXAM: TRANSABDOMINAL AND TRANSVAGINAL ULTRASOUND OF PELVIS TECHNIQUE: Both transabdominal and transvaginal ultrasound examinations of  the pelvis were performed. Transabdominal technique was performed for global imaging of the pelvis including uterus, ovaries, adnexal regions, and pelvic cul-de-sac. It was necessary to proceed with endovaginal exam following the transabdominal exam to visualize the ovaries. COMPARISON:  None FINDINGS: Uterus Measurements: 6.7 x 2.8 x 4.1 cm = volume: 41 mL. Anteverted. Heterogeneous myometrial echogenicity. Nabothian cyst at cervix. No focal mass. Endometrium Thickness: 9 mm. Abnormal heterogeneous appearance with internal vascularity on color Doppler imaging. No endometrial fluid. Right ovary Not visualized on either transabdominal or endovaginal imaging, question atrophic versus obscured by bowel Left ovary Not visualized on either transabdominal or endovaginal imaging, question atrophic versus obscured by bowel Other findings No free pelvic fluid. No adnexal masses. Visualized portion of urinary bladder unremarkable. IMPRESSION: Nonvisualization of ovaries. Thickened endometrial complex 9 mm thick, abnormal heterogeneous in appearance with mild internal vascularity on color Doppler imaging Endometrial thickness is considered abnormal for an asymptomatic post-menopausal female. Endometrial sampling should be considered to  exclude carcinoma. These results will be called to the ordering clinician or representative by the Radiologist Assistant, and communication documented in the PACS or zVision Dashboard. Electronically Signed   By: Lavonia Dana M.D.   On: 05/22/2019 16:27    No results found.  No results found.    Assessment and Plan: Patient Active Problem List   Diagnosis Date Noted  . Diastolic dysfunction [id number]  . Peripheral edema 09/10/2019  . Dyspnea on exertion 09/10/2019  . Muscle cramps 09/10/2019  . Type 2 diabetes mellitus with hyperglycemia ([place]) 05/18/2019  . Seasonal allergic rhinitis 05/18/2019  . Screening for breast cancer 05/18/2019  . Stenosis of left carotid artery  02/05/2019  . Acute upper respiratory infection 10/09/2018  . Flu-like symptoms 10/09/2018  . Sore throat 10/09/2018  . Vitamin D deficiency 07/27/2018  . Need for vaccination against Streptococcus pneumoniae using pneumococcal conjugate vaccine 7 07/27/2018  . Encounter for general adult medical examination with abnormal findings 04/07/2018  . Primary generalized (osteo)arthritis 04/07/2018  . Hematuria, microscopic 12/18/2017  . Renal colic [id number]  . Urinary tract infection without hematuria 11/21/2017  . Dysuria 11/21/2017  . Uncontrolled type 2 diabetes mellitus with hyperglycemia ([place]) 11/21/2017  . Diabetes mellitus type 2, uncomplicated ([place]) [id number]  . Osteoporosis 10/04/2017  . S/P total hip arthroplasty 06/14/2016  . Inguinal hernia, left 06/09/2013  . Umbilical hernia [id number]  . Increased frequency of urination 11/27/2012  . Chronic cystitis 11/19/2012  . Incomplete emptying of bladder 11/19/2012  . Medullary sponge kidney 11/19/2012  . Mixed urge and stress incontinence 11/19/2012  . Anxiety 01/02/2012  . Calculus of kidney 01/02/2012  . Chronic obstructive pulmonary disease ([place]) 01/02/2012  . GERD (gastroesophageal reflux disease) 01/02/2012  . Hearing loss 01/02/2012  . Hyperlipidemia, unspecified 01/02/2012  . Obesity, unspecified 01/02/2012  . OSA on CPAP 01/02/2012  . Tobacco abuse 01/02/2012  . Venous insufficiency 01/02/2012    1. OSA on CPAP Encouraged to continue with CPAP each night and to try to wear consistently for 5 hours each night. Will reassess symptoms after nightly compliance and if needed will order CPAP titration therapy.  2. Chronic obstructive pulmonary disease, unspecified COPD type ([place]) Stable on current medication therapies, continue to monitor.  3. SOB (shortness of breath) - Spirometry with Graph  4. Seasonal allergic rhinitis, unspecified trigger Stable on current therapy at this time, continue to monitor.  General  Counseling: I have discussed the findings of the evaluation and examination with Ashlei.  I have also discussed any further diagnostic evaluation thatmay be needed or ordered today. Zafiro verbalizes understanding of the findings of todays visit. We also reviewed her medications today and discussed drug interactions and side effects including but not limited excessive drowsiness and altered mental states. We also discussed that there is always a risk not just to her but also people around her. she has been encouraged to call the office with any questions or concerns that should arise related to todays visit.  Orders Placed This Encounter  Procedures  . Spirometry with Graph    Order Specific Question:   Where should this test be performed?    Answer:   [place]     Time spent: 30 includes 10 minutes of chart review This patient was seen by Orson Gear AGNP-C in Collaboration with Dr. Devona Konig as a part of collaborative care agreement.  I have personally obtained a history, examined the patient, evaluated laboratory and imaging results, formulated the assessment and plan  and placed orders.    Allyne Gee, MD [facility] Pulmonary and Critical Care Sleep medicine

## 2019-10-11 DIAGNOSIS — Z23 Encounter for immunization: Secondary | ICD-10-CM | POA: Diagnosis not present

## 2019-10-16 ENCOUNTER — Ambulatory Visit: Payer: Medicare Other | Admitting: Physician Assistant

## 2019-10-24 ENCOUNTER — Ambulatory Visit: Payer: Medicare Other | Admitting: Physician Assistant

## 2019-10-24 ENCOUNTER — Encounter: Payer: Self-pay | Admitting: Physician Assistant

## 2019-10-28 ENCOUNTER — Telehealth: Payer: Self-pay | Admitting: Physician Assistant

## 2019-10-28 ENCOUNTER — Ambulatory Visit (INDEPENDENT_AMBULATORY_CARE_PROVIDER_SITE_OTHER): Payer: Medicare Other | Admitting: Physician Assistant

## 2019-10-28 ENCOUNTER — Ambulatory Visit
Admission: RE | Admit: 2019-10-28 | Discharge: 2019-10-28 | Disposition: A | Discharge status: Home / Self Care | Payer: Medicare Other | Attending: Physician Assistant | Admitting: Physician Assistant

## 2019-10-28 ENCOUNTER — Other Ambulatory Visit: Payer: Self-pay

## 2019-10-28 ENCOUNTER — Ambulatory Visit
Admission: RE | Admit: 2019-10-28 | Discharge: 2019-10-28 | Disposition: A | Discharge status: Home / Self Care | Payer: Medicare Other | Source: Ambulatory Visit | Attending: Physician Assistant | Admitting: Physician Assistant

## 2019-10-28 VITALS — BP 101/67 | HR 78 | Ht 65.0 in | Wt 178.0 lb

## 2019-10-28 DIAGNOSIS — Z87442 Personal history of urinary calculi: Secondary | ICD-10-CM | POA: Insufficient documentation

## 2019-10-28 DIAGNOSIS — R300 Dysuria: Secondary | ICD-10-CM | POA: Diagnosis not present

## 2019-10-28 DIAGNOSIS — R109 Unspecified abdominal pain: Secondary | ICD-10-CM | POA: Insufficient documentation

## 2019-10-28 DIAGNOSIS — N2889 Other specified disorders of kidney and ureter: Secondary | ICD-10-CM | POA: Diagnosis not present

## 2019-10-28 DIAGNOSIS — Z87440 Personal history of urinary (tract) infections: Secondary | ICD-10-CM

## 2019-10-28 DIAGNOSIS — N3281 Overactive bladder: Secondary | ICD-10-CM | POA: Diagnosis not present

## 2019-10-28 LAB — URINALYSIS, COMPLETE
Bilirubin, UA: NEGATIVE
Glucose, UA: NEGATIVE
Ketones, UA: NEGATIVE
Nitrite, UA: NEGATIVE
Protein,UA: NEGATIVE
RBC, UA: NEGATIVE
Specific Gravity, UA: 1.02 (ref 1.005–1.030)
Urobilinogen, Ur: 0.2 mg/dL (ref 0.2–1.0)
pH, UA: 5.5 (ref 5.0–7.5)

## 2019-10-28 LAB — MICROSCOPIC EXAMINATION: RBC, Urine: NONE SEEN /hpf (ref 0–2)

## 2019-10-28 LAB — BLADDER SCAN AMB NON-IMAGING: Scan Result: 0

## 2019-10-28 MED ORDER — MIRABEGRON ER 50 MG PO TB24: 50.0000 mg | ORAL_TABLET | Freq: Every day | ORAL | 1 refills | Status: DC

## 2019-10-28 NOTE — Progress Notes (Signed)
10/28/2019 9:59 AM   Amber Holder [birth year] [id number]  CC: OAB follow-up  HPI: Amber Holder is a 74 y.o. female who presents today for OAB follow-up.  She was seen most recently by me on 07/17/2019 for the same.  She was started on Myrbetriq 50 mg daily in October 2020 for management of symptoms including urinary urgency and frequency.  PMH significant for IBS with diarrhea on daily cholestyramine and as needed dicyclomine and managed by GI.  Today, she reports overall improvement in her OAB symptoms on Myrbetriq.  Despite this, she does report worsened dysuria, hesitancy, and urinary leakage x3 weeks as well as intermittent twinges of pain in her left flank x2 weeks.  She is unsure if she has a UTI or is passing a kidney stone.  She has known to have left-sided nephrolithiasis, previously seen on KUB.  She denies nausea, vomiting, fever, and gross hematuria.  She does report some recent chills and an episode of a "stomach bug" 4 days ago.  She is unsure if this is related to her symptoms.  OAB questionnaire today as below. Urgency: Mild (0-3x/day), stable Frequency: Moderate (4-7x/day), stable Fluid restriction: Low (0-3x/week), decreased Toilet mapping: Yes, increased Urinary incontinence: Moderate, increased Nocturia: Mild, stable  In-office UA today positive for trace leukocyte esterase; urine microscopy with 11-30 WBCs/HPF and moderate bacteria. PVR 88mL.  PMH: Past Medical History:  Diagnosis Date  . Anxiety 01/02/2012  . Arthritis   . Asthma   . Chronic kidney disease    Kidney stones  . Colon polyps   . COPD (chronic obstructive pulmonary disease) ([place])   . Diabetes mellitus without complication ([place])   . GERD (gastroesophageal reflux disease)   . HOH (hard of hearing)    Bilateral hearing aids  . Osteoporosis   . Sleep apnea    Does not use C-PAP on a regular basis  . Venous stasis     Surgical History: Past Surgical History:  Procedure Laterality Date  .  APPENDECTOMY    . cataract surgery Bilateral   . CHOLECYSTECTOMY    . COLONOSCOPY    . COLONOSCOPY WITH PROPOFOL N/A 05/28/2015   Procedure: COLONOSCOPY WITH PROPOFOL;  Surgeon: Lollie Sails, MD;  Location: [facility] ENDOSCOPY;  Service: Endoscopy;  Laterality: N/A;  . COLONOSCOPY WITH PROPOFOL N/A 10/28/2018   Procedure: COLONOSCOPY WITH PROPOFOL;  Surgeon: Lollie Sails, MD;  Location: [facility] ENDOSCOPY;  Service: Endoscopy;  Laterality: N/A;  . EYE SURGERY Bilateral 2015   Cataract Extraction with IOL  . LITHOTRIPSY  2015   has had many stones  . Mastoidotomy  1970  . ROTATOR CUFF REPAIR Right 2011  . TOTAL HIP ARTHROPLASTY Right 06/14/2016   Procedure: TOTAL HIP ARTHROPLASTY;  Surgeon: Dereck Leep, MD;  Location: [facility] ORS;  Service: Orthopedics;  Laterality: Right;    Home Medications:  Allergies as of 10/28/2019      Reactions   Shellfish Allergy Swelling   "shrimp" Betadine okay   Sulfa Antibiotics Hives   Difficulty breathing   Sulfasalazine Hives   Difficulty breathing   Percocet [oxycodone-acetaminophen] Hives   Difficulty breathing   Iodinated Diagnostic Agents Itching   Iodine Itching      Medication List       Accurate as of October 28, 2019  9:59 AM. If you have any questions, ask your nurse or doctor.        acetaminophen 500 MG chewable tablet Commonly known as: TYLENOL Chew 1,000 mg by mouth every  8 (eight) hours as needed for pain.   albuterol 108 (90 Base) MCG/ACT inhaler Commonly known as: VENTOLIN HFA Inhale 2 puffs into the lungs every 6 (six) hours as needed for wheezing or shortness of breath.   cholecalciferol 10 MCG (400 UNIT) Tabs tablet Commonly known as: VITAMIN D3 Take 2,000 Units by mouth daily.   cholestyramine 4 g packet Commonly known as: QUESTRAN Take by mouth.   ciprofloxacin-dexamethasone OTIC suspension Commonly known as: CIPRODEX APPLY 3 4 DROPS TO RIGHT EAR 2 3 TIMES DAILY FOR 1 WEEK THEN USE AS NEEDED   dicyclomine 10  MG capsule Commonly known as: BENTYL   ergocalciferol 1.25 MG (50000 UT) capsule Commonly known as: Drisdol Take 1 capsule (50,000 Units total) by mouth once a week.   furosemide 20 MG tablet Commonly known as: LASIX TAKE 1 TABLET BY MOUTH DAILY   hydrocortisone cream 0.5 % Apply topically.   ibandronate 150 MG tablet Commonly known as: BONIVA Take 1 tablet (150 mg total) by mouth every 30 (thirty) days.   Magnesium 250 MG Tabs Take by mouth daily.   meloxicam 7.5 MG tablet Commonly known as: MOBIC TAKE 1 TABLET (7.5 MG TOTAL) BY MOUTH 2 (TWO) TIMES A DAY.   metFORMIN 500 MG tablet Commonly known as: GLUCOPHAGE Take 0.5 tablets (250 mg total) by mouth daily with breakfast.   mirabegron ER 50 MG Tb24 tablet Commonly known as: MYRBETRIQ Take 1 tablet (50 mg total) by mouth daily.   montelukast 10 MG tablet Commonly known as: SINGULAIR TAKE 1 TABLET BY MOUTH DAILY FOR ALLERGIES   nitrofurantoin (macrocrystal-monohydrate) 100 MG capsule Commonly known as: MACROBID Take 1 capsule (100 mg total) by mouth daily.   OneTouch Verio test strip Generic drug: glucose blood BLOOD SUGAR TESTING ONCE DAILY . DX E11.65   pneumococcal 23 valent vaccine 25 MCG/0.5ML injection Commonly known as: Pneumovax 23 Inject 0.9ml IM once   potassium chloride 8 MEQ tablet Commonly known as: KLOR-CON Take 8 mEq by mouth as needed (only take when taking Lasix).   tiotropium 18 MCG inhalation capsule Commonly known as: SPIRIVA Place 1 capsule (18 mcg total) into inhaler and inhale daily.       Allergies:  Allergies  Allergen Reactions  . Shellfish Allergy Swelling    "shrimp" Betadine okay  . Sulfa Antibiotics Hives    Difficulty breathing  . Sulfasalazine Hives    Difficulty breathing  . Percocet [Oxycodone-Acetaminophen] Hives    Difficulty breathing  . Iodinated Diagnostic Agents Itching  . Iodine Itching    Family History: Family History  Problem Relation Age of Onset   . Breast cancer Maternal Aunt 24  . Bladder Cancer Neg Hx   . Kidney cancer Neg Hx     Social History:   reports that she has been smoking cigarettes. She has been smoking about 1.00 pack per day. She has never used smokeless tobacco. She reports previous alcohol use. She reports that she does not use drugs.  Physical Exam: BP 101/67 (BP Location: Left Arm, Patient Position: Sitting, Cuff Size: Large)   Pulse 78   Ht 5\' 5"  (1.651 m)   Wt 178 lb (80.7 kg)   BMI 29.62 kg/m   Constitutional:  Alert and oriented, no acute distress, nontoxic appearing HEENT: [place], AT, hard of hearing Cardiovascular: No clubbing, cyanosis, or edema Respiratory: Normal respiratory effort, no increased work of breathing GU: No CVA tenderness Skin: No rashes, bruises or suspicious lesions Neurologic: Grossly intact, no focal deficits, moving  all 4 extremities Psychiatric: Normal mood and affect  Laboratory Data: Results for orders placed or performed in visit on 10/28/19  Microscopic Examination   URINE  Result Value Ref Range   WBC, UA 11-30 (A) 0 - 5 /hpf   RBC None seen 0 - 2 /hpf   Epithelial Cells (non renal) 0-10 0 - 10 /hpf   Bacteria, UA Moderate (A) None seen/Few  Urinalysis, Complete  Result Value Ref Range   Specific Gravity, UA 1.020 1.005 - 1.030   pH, UA 5.5 5.0 - 7.5   Color, UA Yellow Yellow   Appearance Ur Clear Clear   Leukocytes,UA Trace (A) Negative   Protein,UA Negative Negative/Trace   Glucose, UA Negative Negative   Ketones, UA Negative Negative   RBC, UA Negative Negative   Bilirubin, UA Negative Negative   Urobilinogen, Ur 0.2 0.2 - 1.0 mg/dL   Nitrite, UA Negative Negative   Microscopic Examination See below:   Bladder Scan (Post Void Residual) in office  Result Value Ref Range   Scan Result 0    Pertinent Imaging: KUB, 10/28/2019: CLINICAL DATA:  Left flank pain for 2 days.  EXAM: ABDOMEN - 1 VIEW  COMPARISON:  KUB 04/23/2019.  FINDINGS: 0.4 cm  calcification projecting over the upper pole of the left kidney compatible with a nonobstructing stone is unchanged. No other evidence of urinary tract stone is identified. Small calcification inferior to the right L3 transverse process is unchanged. Surgical clips in the right upper quadrant are noted. Bowel gas pattern is unremarkable. No acute or focal bony abnormality.  IMPRESSION: Unchanged 0.4 cm calcification projecting over the upper pole of the right kidney compatible with a nonobstructing stone. No other evidence of urinary tract stone is identified.   Electronically Signed   By: Inge Rise M.D.   On: 10/28/2019 15:53  I personally reviewed the images referenced above and note a stable left renal calculus.  Assessment & Plan:   1. OAB (overactive bladder) Symptoms improved, per patient report.  PVR WNL.  Will refill Myrbetriq 50 mg daily.  No new recommendations today. - Bladder Scan (Post Void Residual) in office - mirabegron ER (MYRBETRIQ) 50 MG TB24 tablet; Take 1 tablet (50 mg total) by mouth daily.  Dispense: 90 tablet; Refill: 1  2. Flank pain with history of urolithiasis Stable left renal stone on KUB today.  No further intervention required. - DG Abd 1 View  3. Dysuria Urine notable for pyuria and bacteriuria today.  Will send for culture and treat per results. - Urinalysis, Complete - CULTURE, URINE COMPREHENSIVE   Return if symptoms worsen or fail to improve.  Debroah Loop, PA-C  [facility] [address], [place], [place] [address] [phone]

## 2019-10-28 NOTE — Patient Instructions (Addendum)
1. Go to the imaging department across the lobby in the [facility] to get your abdominal x-ray today.  I will call you this afternoon with your results. 2. I will send your urine for culture today and treat you for a urinary tract infection based on these results.  I will call you when I get your results. 3. I have refilled your Myrbetriq with your pharmacy. Dr. Diamantina Providence will see you in August for your next checkup.

## 2019-10-28 NOTE — Telephone Encounter (Signed)
Please contact the patient and inform her that her abdominal x-ray showed a stable left kidney stone.  I do not believe she is passing a stone right now.  I will contact her later this week when I receive the results of her urine culture and treat her for urinary tract infection.

## 2019-10-29 NOTE — Telephone Encounter (Signed)
Patient notified

## 2019-11-01 LAB — CULTURE, URINE COMPREHENSIVE

## 2019-11-03 ENCOUNTER — Telehealth: Payer: Self-pay | Admitting: Physician Assistant

## 2019-11-03 DIAGNOSIS — Z23 Encounter for immunization: Secondary | ICD-10-CM | POA: Diagnosis not present

## 2019-11-03 MED ORDER — AMOXICILLIN-POT CLAVULANATE 875-125 MG PO TABS: 1.0000 | ORAL_TABLET | Freq: Two times a day (BID) | ORAL | 0 refills | Status: AC

## 2019-11-03 NOTE — Telephone Encounter (Signed)
Please contact the patient and inform her that I have received the results of her urine culture.  I would like to treat her for urinary tract infection.  I have sent a prescription for Augmentin twice daily for 5 days to the [facility] in [place].  Please counseled her start these as soon as possible.

## 2019-11-03 NOTE — Telephone Encounter (Signed)
Notified patient as instructed, patient pleased. Discussed follow-up appointments, patient agrees  

## 2019-11-10 DIAGNOSIS — H903 Sensorineural hearing loss, bilateral: Secondary | ICD-10-CM | POA: Diagnosis not present

## 2019-12-08 ENCOUNTER — Ambulatory Visit: Payer: Medicare Other | Admitting: Internal Medicine

## 2019-12-25 ENCOUNTER — Telehealth: Payer: Self-pay

## 2019-12-25 NOTE — Telephone Encounter (Signed)
Lmom to confirm and screen for 12-29-19 ov.

## 2019-12-29 ENCOUNTER — Encounter: Payer: Self-pay | Admitting: Nurse Practitioner

## 2019-12-29 ENCOUNTER — Ambulatory Visit (INDEPENDENT_AMBULATORY_CARE_PROVIDER_SITE_OTHER): Payer: Medicare Other | Admitting: Nurse Practitioner

## 2019-12-29 ENCOUNTER — Other Ambulatory Visit: Payer: Self-pay

## 2019-12-29 VITALS — BP 130/67 | HR 66 | Temp 96.1°F | Resp 16 | Ht 65.0 in | Wt 188.6 lb

## 2019-12-29 DIAGNOSIS — R300 Dysuria: Secondary | ICD-10-CM

## 2019-12-29 DIAGNOSIS — R609 Edema, unspecified: Secondary | ICD-10-CM | POA: Diagnosis not present

## 2019-12-29 DIAGNOSIS — I878 Other specified disorders of veins: Secondary | ICD-10-CM

## 2019-12-29 DIAGNOSIS — L209 Atopic dermatitis, unspecified: Secondary | ICD-10-CM | POA: Diagnosis not present

## 2019-12-29 DIAGNOSIS — N390 Urinary tract infection, site not specified: Secondary | ICD-10-CM | POA: Diagnosis not present

## 2019-12-29 DIAGNOSIS — E1165 Type 2 diabetes mellitus with hyperglycemia: Secondary | ICD-10-CM | POA: Diagnosis not present

## 2019-12-29 LAB — POCT URINALYSIS DIPSTICK
Bilirubin, UA: NEGATIVE
Blood, UA: NEGATIVE
Glucose, UA: NEGATIVE
Ketones, UA: NEGATIVE
Nitrite, UA: NEGATIVE
Protein, UA: NEGATIVE
Spec Grav, UA: 1.01 (ref 1.010–1.025)
Urobilinogen, UA: 0.2 E.U./dL
pH, UA: 5 (ref 5.0–8.0)

## 2019-12-29 LAB — POCT GLYCOSYLATED HEMOGLOBIN (HGB A1C): Hemoglobin A1C: 6 % — AB (ref 4.0–5.6)

## 2019-12-29 MED ORDER — TRIAMCINOLONE ACETONIDE 0.1 % EX CREA: 1.0000 "application " | TOPICAL_CREAM | Freq: Two times a day (BID) | CUTANEOUS | 3 refills | Status: DC

## 2019-12-29 NOTE — Progress Notes (Signed)
[facility] [place], [place] [address]  Internal MEDICINE  Office Visit Note  Patient Name: Amber Holder  [id number]  [id number]  Date of Service: 01/11/2020  Chief Complaint  Patient presents with  . Diabetes  . Gastroesophageal Reflux  . Asthma  . Arthritis  . Leg Problem    legs feel wet and cold, oozing     The patient is here for routine follow up visit. The patient states that she has been having some right shoulder pain for last two or three weeks. She feels like she may have done something while gardening. Hurts to stretch her right arm in front of her. Will even cause pain when she puts her hand on the steering wheel.  She does have moderate swelling in both lower legs right now. She does have diagnosis of venous stasis. She has had this diagnosis for most of her life. She has noted a sore on the lower left leg. She believes it has healed, but would like to have it checked.  Her blood sugars continue to be well managed. Blood pressure also well controlled.       Current Medication: Outpatient Encounter Medications as of 12/29/2019  Medication Sig  . acetaminophen (TYLENOL) 500 MG chewable tablet Chew 1,000 mg by mouth every 8 (eight) hours as needed for pain.  Marland Kitchen albuterol (PROVENTIL HFA;VENTOLIN HFA) 108 (90 Base) MCG/ACT inhaler Inhale 2 puffs into the lungs every 6 (six) hours as needed for wheezing or shortness of breath.  . cholecalciferol (VITAMIN D) 400 UNITS TABS tablet Take 2,000 Units by mouth daily.   . cholestyramine (QUESTRAN) 4 g packet Take by mouth.  . ciprofloxacin-dexamethasone (CIPRODEX) OTIC suspension APPLY 3 4 DROPS TO RIGHT EAR 2 3 TIMES DAILY FOR 1 WEEK THEN USE AS NEEDED  . dicyclomine (BENTYL) 10 MG capsule   . ergocalciferol (DRISDOL) 1.25 MG (50000 UT) capsule Take 1 capsule (50,000 Units total) by mouth once a week.  . furosemide (LASIX) 20 MG tablet TAKE 1 TABLET BY MOUTH DAILY  . hydrocortisone cream 0.5 % Apply  topically.  . ibandronate (BONIVA) 150 MG tablet Take 1 tablet (150 mg total) by mouth every 30 (thirty) days.  . Magnesium 250 MG TABS Take by mouth daily.  . meloxicam (MOBIC) 7.5 MG tablet TAKE 1 TABLET (7.5 MG TOTAL) BY MOUTH 2 (TWO) TIMES A DAY.  . metFORMIN (GLUCOPHAGE) 500 MG tablet Take 0.5 tablets (250 mg total) by mouth daily with breakfast.  . mirabegron ER (MYRBETRIQ) 50 MG TB24 tablet Take 1 tablet (50 mg total) by mouth daily.  . montelukast (SINGULAIR) 10 MG tablet TAKE 1 TABLET BY MOUTH DAILY FOR ALLERGIES  . ONETOUCH VERIO test strip BLOOD SUGAR TESTING ONCE DAILY . DX E11.65  . pneumococcal 23 valent vaccine (PNEUMOVAX 23) 25 MCG/0.5ML injection Inject 0.47ml IM once  . potassium chloride (KLOR-CON) 8 MEQ tablet Take 8 mEq by mouth as needed (only take when taking Lasix).   Marland Kitchen tiotropium (SPIRIVA) 18 MCG inhalation capsule Place 1 capsule (18 mcg total) into inhaler and inhale daily.  Marland Kitchen triamcinolone cream (KENALOG) 0.1 % Apply 1 application topically 2 (two) times daily.  . [DISCONTINUED] nitrofurantoin, macrocrystal-monohydrate, (MACROBID) 100 MG capsule Take 1 capsule (100 mg total) by mouth daily. (Patient not taking: Reported on 12/29/2019)   No facility-administered encounter medications on file as of 12/29/2019.    Surgical History: Past Surgical History:  Procedure Laterality Date  . APPENDECTOMY    . cataract surgery  Bilateral   . CHOLECYSTECTOMY    . COLONOSCOPY    . COLONOSCOPY WITH PROPOFOL N/A 05/28/2015   Procedure: COLONOSCOPY WITH PROPOFOL;  Surgeon: Lollie Sails, MD;  Location[facility] ENDOSCOPY;  Service: Endoscopy;  Laterality: N/A;  . COLONOSCOPY WITH PROPOFOL N/A 10/28/2018   Procedure: COLONOSCOPY WITH PROPOFOL;  Surgeon: Lollie Sails, MD;  Location: [facility] ENDOSCOPY;  Service: Endoscopy;  Laterality: N/A;  . EYE SURGERY Bilateral 2015   Cataract Extraction with IOL  . LITHOTRIPSY  2015   has had many stones  . Mastoidotomy  1970  . ROTATOR CUFF  REPAIR Right 2011  . TOTAL HIP ARTHROPLASTY Right 06/14/2016   Procedure: TOTAL HIP ARTHROPLASTY;  Surgeon: Dereck Leep, MD;  Location: [facility] ORS;  Service: Orthopedics;  Laterality: Right;    Medical History: Past Medical History:  Diagnosis Date  . Anxiety 01/02/2012  . Arthritis   . Asthma   . Chronic kidney disease    Kidney stones  . Colon polyps   . COPD (chronic obstructive pulmonary disease) ([place])   . Diabetes mellitus without complication ([place])   . GERD (gastroesophageal reflux disease)   . HOH (hard of hearing)    Bilateral hearing aids  . Osteoporosis   . Sleep apnea    Does not use C-PAP on a regular basis  . Venous stasis     Family History: Family History  Problem Relation Age of Onset  . Breast cancer Maternal Aunt 58  . Bladder Cancer Neg Hx   . Kidney cancer Neg Hx     Social History   Socioeconomic History  . Marital status: Divorced    Spouse name: Not on file  . Number of children: Not on file  . Years of education: Not on file  . Highest education level: Not on file  Occupational History  . Not on file  Tobacco Use  . Smoking status: Current Every Day Smoker    Packs/day: 1.00    Types: Cigarettes  . Smokeless tobacco: Never Used  Substance and Sexual Activity  . Alcohol use: Yes    Comment: very rarely   . Drug use: No  . Sexual activity: Not Currently  Other Topics Concern  . Not on file  Social History Narrative  . Not on file   Social Determinants of Health   Financial Resource Strain:   . Difficulty of Paying Living Expenses:   Food Insecurity:   . Worried About Charity fundraiser in the Last Year:   . Arboriculturist in the Last Year:   Transportation Needs:   . Film/video editor (Medical):   Marland Kitchen Lack of Transportation (Non-Medical):   Physical Activity:   . Days of Exercise per Week:   . Minutes of Exercise per Session:   Stress:   . Feeling of Stress :   Social Connections:   . Frequency of Communication with  Friends and Family:   . Frequency of Social Gatherings with Friends and Family:   . Attends Religious Services:   . Active Member of Clubs or Organizations:   . Attends Archivist Meetings:   Marland Kitchen Marital Status:   Intimate Partner Violence:   . Fear of Current or Ex-Partner:   . Emotionally Abused:   Marland Kitchen Physically Abused:   . Sexually Abused:       Review of Systems  Constitutional: Negative for activity change, chills, fatigue and unexpected weight change.  HENT: Negative for congestion, nosebleeds, postnasal drip,  rhinorrhea and voice change.   Respiratory: Positive for shortness of breath. Negative for cough, chest tightness and wheezing.        Dyspnea on exertion which is stable   Cardiovascular: Positive for leg swelling. Negative for chest pain and palpitations.  Gastrointestinal: Negative for constipation, diarrhea, nausea and vomiting.  Endocrine: Negative for cold intolerance, heat intolerance, polydipsia and polyuria.       Blood sugars doing well   Genitourinary: Positive for dysuria, frequency and urgency.  Musculoskeletal: Positive for back pain.  Skin: Negative for rash.  Allergic/Immunologic: Positive for environmental allergies.  Neurological: Negative for dizziness and headaches.  Hematological: Negative for adenopathy.  Psychiatric/Behavioral: Negative for behavioral problems, dysphoric mood and sleep disturbance. The patient is not nervous/anxious.     Today's Vitals   12/29/19 1104  BP: 130/67  Pulse: 66  Resp: 16  Temp: (!) 96.1 F (35.6 C)  SpO2: 97%  Weight: 188 lb 9.6 oz (85.5 kg)  Height: 5\' 5"  (1.651 m)   Body mass index is 31.38 kg/m.  Physical Exam Vitals and nursing note reviewed.  Constitutional:      General: She is not in acute distress.    Appearance: Normal appearance.  HENT:     Head: Normocephalic and atraumatic.     Nose: Nose normal.  Eyes:     Pupils: Pupils are equal, round, and reactive to light.  Neck:      Vascular: No carotid bruit.  Cardiovascular:     Rate and Rhythm: Normal rate and regular rhythm.     Pulses: Normal pulses.     Heart sounds: Normal heart sounds.     Comments: There is 2+ pitting edema in bilateral lower extremities. Pulmonary:     Effort: Pulmonary effort is normal.     Breath sounds: Normal breath sounds.  Abdominal:     Palpations: Abdomen is soft.  Genitourinary:    Comments: Urine sample positive for small WBC Musculoskeletal:     Cervical back: Normal range of motion and neck supple.     Right lower leg: 2+ Edema present.     Left lower leg: 2+ Edema present.  Skin:    Capillary Refill: Capillary refill takes less than 2 seconds.  Neurological:     Mental Status: She is alert.  Psychiatric:        Mood and Affect: Mood normal.        Behavior: Behavior normal.        Thought Content: Thought content normal.        Judgment: Judgment normal.   Assessment/Plan: 1. Type 2 diabetes mellitus with hyperglycemia, without long-term current use of insulin (HCC) - POCT HgB A1C 6.0 today. Continue diabetic medication as prescribed. Monitor closely.   2. Chronic venous stasis Recommend patient use compression socks every day while awake. Rest and elevate the feet and legs when possible.   3. Peripheral edema likely due to chronic venous stasis. Recommend patient use compression socks every day while awake. Rest and elevate the feet and legs when possible.   4. Atopic dermatitis, unspecified type Trial of triamcinolone cream 0.1% cream. Apply to affected areas twice daily as needed for skin itching and irritation.  - triamcinolone cream (KENALOG) 0.1 %; Apply 1 application topically 2 (two) times daily.  Dispense: 45 g; Refill: 3  5. Dysuria - POCT Urinalysis Dipstick showing presence of small WBC only. Will send for culture and sensitivity and treat as indicated.   General Counseling: Keeleigh verbalizes  understanding of the findings of todays visit and agrees  with plan of treatment. I have discussed any further diagnostic evaluation that may be needed or ordered today. We also reviewed her medications today. she has been encouraged to call the office with any questions or concerns that should arise related to todays visit.  This patient was seen by Leretha Pol FNP Collaboration with Dr Lavera Guise as a part of collaborative care agreement  Orders Placed This Encounter  Procedures  . CULTURE, URINE COMPREHENSIVE  . POCT HgB A1C  . POCT Urinalysis Dipstick    Meds ordered this encounter  Medications  . triamcinolone cream (KENALOG) 0.1 %    Sig: Apply 1 application topically 2 (two) times daily.    Dispense:  45 g    Refill:  3    Order Specific Question:   Supervising Provider    Answer:   Lavera Guise [id number]    Total time spent: 30 Minutes   Time spent includes review of chart, medications, test results, and follow up plan with the patient.      Dr Lavera Guise Internal medicine

## 2020-01-01 LAB — CULTURE, URINE COMPREHENSIVE

## 2020-01-11 DIAGNOSIS — L209 Atopic dermatitis, unspecified: Secondary | ICD-10-CM | POA: Insufficient documentation

## 2020-01-11 DIAGNOSIS — I878 Other specified disorders of veins: Secondary | ICD-10-CM | POA: Insufficient documentation

## 2020-01-20 ENCOUNTER — Telehealth: Payer: Self-pay

## 2020-01-20 NOTE — Telephone Encounter (Signed)
Called lmom informing patient of appointment on 01/22/2020. klh

## 2020-01-22 ENCOUNTER — Encounter: Payer: Self-pay | Admitting: Internal Medicine

## 2020-01-22 ENCOUNTER — Other Ambulatory Visit: Payer: Self-pay

## 2020-01-22 ENCOUNTER — Ambulatory Visit (INDEPENDENT_AMBULATORY_CARE_PROVIDER_SITE_OTHER): Payer: Medicare Other | Admitting: Internal Medicine

## 2020-01-22 VITALS — BP 116/66 | HR 60 | Temp 97.3°F | Resp 16 | Ht 65.0 in | Wt 187.0 lb

## 2020-01-22 DIAGNOSIS — J449 Chronic obstructive pulmonary disease, unspecified: Secondary | ICD-10-CM

## 2020-01-22 DIAGNOSIS — Z9989 Dependence on other enabling machines and devices: Secondary | ICD-10-CM

## 2020-01-22 DIAGNOSIS — J302 Other seasonal allergic rhinitis: Secondary | ICD-10-CM | POA: Diagnosis not present

## 2020-01-22 DIAGNOSIS — G4733 Obstructive sleep apnea (adult) (pediatric): Secondary | ICD-10-CM

## 2020-02-02 ENCOUNTER — Other Ambulatory Visit: Payer: Self-pay

## 2020-02-02 MED ORDER — INCRUSE ELLIPTA 62.5 MCG/INH IN AEPB: 1.0000 | INHALATION_SPRAY | Freq: Every day | RESPIRATORY_TRACT | 1 refills | Status: DC

## 2020-02-02 MED ORDER — FUROSEMIDE 20 MG PO TABS: 20.0000 mg | ORAL_TABLET | Freq: Every day | ORAL | 1 refills | Status: DC

## 2020-02-02 NOTE — Telephone Encounter (Signed)
spiriva is no longer covered by insurance. Per adam sent incruse to take place of spiriva. Pt needed refill on furosemide as well.

## 2020-02-02 NOTE — Progress Notes (Signed)
[facility] [place], [place] [address]  Pulmonary Sleep Medicine   Office Visit Note  Patient Name: Amber Holder DOB: 1946/03/29 MRN [id number]  Date of Service: 02/02/2020  Complaints/HPI: Pt is here for pulmonary follow up.  She has a letter present that shows her insurance company would rather her use incruze than spiriva.  Will send new rx at this time.  She has good control with Spiriva, but is willing to change. She denies    ROS  General: (-) fever, (-) chills, (-) night sweats, (-) weakness Skin: (-) rashes, (-) itching,. Eyes: (-) visual changes, (-) redness, (-) itching. Nose and Sinuses: (-) nasal stuffiness or itchiness, (-) postnasal drip, (-) nosebleeds, (-) sinus trouble. Mouth and Throat: (-) sore throat, (-) hoarseness. Neck: (-) swollen glands, (-) enlarged thyroid, (-) neck pain. Respiratory: - cough, (-) bloody sputum, - shortness of breath, - wheezing. Cardiovascular: - ankle swelling, (-) chest pain. Lymphatic: (-) lymph node enlargement. Neurologic: (-) numbness, (-) tingling. Psychiatric: (-) anxiety, (-) depression   Current Medication: Outpatient Encounter Medications as of 01/22/2020  Medication Sig  . acetaminophen (TYLENOL) 500 MG chewable tablet Chew 1,000 mg by mouth every 8 (eight) hours as needed for pain.  Marland Kitchen albuterol (PROVENTIL HFA;VENTOLIN HFA) 108 (90 Base) MCG/ACT inhaler Inhale 2 puffs into the lungs every 6 (six) hours as needed for wheezing or shortness of breath.  . cholecalciferol (VITAMIN D) 400 UNITS TABS tablet Take 2,000 Units by mouth daily.   . cholestyramine (QUESTRAN) 4 g packet Take by mouth.  . ciprofloxacin-dexamethasone (CIPRODEX) OTIC suspension APPLY 3 4 DROPS TO RIGHT EAR 2 3 TIMES DAILY FOR 1 WEEK THEN USE AS NEEDED  . dicyclomine (BENTYL) 10 MG capsule   . ergocalciferol (DRISDOL) 1.25 MG (50000 UT) capsule Take 1 capsule (50,000 Units total) by mouth once a week.  . furosemide (LASIX) 20 MG  tablet TAKE 1 TABLET BY MOUTH DAILY  . hydrocortisone cream 0.5 % Apply topically.  . ibandronate (BONIVA) 150 MG tablet Take 1 tablet (150 mg total) by mouth every 30 (thirty) days.  . Magnesium 250 MG TABS Take by mouth daily.  . meloxicam (MOBIC) 7.5 MG tablet TAKE 1 TABLET (7.5 MG TOTAL) BY MOUTH 2 (TWO) TIMES A DAY.  . metFORMIN (GLUCOPHAGE) 500 MG tablet Take 0.5 tablets (250 mg total) by mouth daily with breakfast.  . mirabegron ER (MYRBETRIQ) 50 MG TB24 tablet Take 1 tablet (50 mg total) by mouth daily.  . montelukast (SINGULAIR) 10 MG tablet TAKE 1 TABLET BY MOUTH DAILY FOR ALLERGIES  . ONETOUCH VERIO test strip BLOOD SUGAR TESTING ONCE DAILY . DX E11.65  . pneumococcal 23 valent vaccine (PNEUMOVAX 23) 25 MCG/0.5ML injection Inject 0.38ml IM once  . potassium chloride (KLOR-CON) 8 MEQ tablet Take 8 mEq by mouth as needed (only take when taking Lasix).   Marland Kitchen tiotropium (SPIRIVA) 18 MCG inhalation capsule Place 1 capsule (18 mcg total) into inhaler and inhale daily.  Marland Kitchen triamcinolone cream (KENALOG) 0.1 % Apply 1 application topically 2 (two) times daily.   No facility-administered encounter medications on file as of 01/22/2020.    Surgical History: Past Surgical History:  Procedure Laterality Date  . APPENDECTOMY    . cataract surgery Bilateral   . CHOLECYSTECTOMY    . COLONOSCOPY    . COLONOSCOPY WITH PROPOFOL N/A 05/28/2015   Procedure: COLONOSCOPY WITH PROPOFOL;  Surgeon: Lollie Sails, MD;  Location: [facility] ENDOSCOPY;  Service: Endoscopy;  Laterality: N/A;  .  COLONOSCOPY WITH PROPOFOL N/A 10/28/2018   Procedure: COLONOSCOPY WITH PROPOFOL;  Surgeon: Lollie Sails, MD;  Location: [facility] ENDOSCOPY;  Service: Endoscopy;  Laterality: N/A;  . EYE SURGERY Bilateral 2015   Cataract Extraction with IOL  . LITHOTRIPSY  2015   has had many stones  . Mastoidotomy  1970  . ROTATOR CUFF REPAIR Right 2011  . TOTAL HIP ARTHROPLASTY Right 06/14/2016   Procedure: TOTAL HIP ARTHROPLASTY;   Surgeon: Dereck Leep, MD;  Location: [facility] ORS;  Service: Orthopedics;  Laterality: Right;    Medical History: Past Medical History:  Diagnosis Date  . Anxiety 01/02/2012  . Arthritis   . Asthma   . Chronic kidney disease    Kidney stones  . Colon polyps   . COPD (chronic obstructive pulmonary disease) ([place])   . Diabetes mellitus without complication ([place])   . GERD (gastroesophageal reflux disease)   . HOH (hard of hearing)    Bilateral hearing aids  . Osteoporosis   . Sleep apnea    Does not use C-PAP on a regular basis  . Venous stasis     Family History: Family History  Problem Relation Age of Onset  . Breast cancer Maternal Aunt 62  . Bladder Cancer Neg Hx   . Kidney cancer Neg Hx     Social History: Social History   Socioeconomic History  . Marital status: Divorced    Spouse name: Not on file  . Number of children: Not on file  . Years of education: Not on file  . Highest education level: Not on file  Occupational History  . Not on file  Tobacco Use  . Smoking status: Current Every Day Smoker    Packs/day: 1.00    Types: Cigarettes  . Smokeless tobacco: Never Used  Substance and Sexual Activity  . Alcohol use: Yes    Comment: very rarely   . Drug use: No  . Sexual activity: Not Currently  Other Topics Concern  . Not on file  Social History Narrative  . Not on file   Social Determinants of Health   Financial Resource Strain:   . Difficulty of Paying Living Expenses:   Food Insecurity:   . Worried About Charity fundraiser in the Last Year:   . Arboriculturist in the Last Year:   Transportation Needs:   . Film/video editor (Medical):   Marland Kitchen Lack of Transportation (Non-Medical):   Physical Activity:   . Days of Exercise per Week:   . Minutes of Exercise per Session:   Stress:   . Feeling of Stress :   Social Connections:   . Frequency of Communication with Friends and Family:   . Frequency of Social Gatherings with Friends and Family:   .  Attends Religious Services:   . Active Member of Clubs or Organizations:   . Attends Archivist Meetings:   Marland Kitchen Marital Status:   Intimate Partner Violence:   . Fear of Current or Ex-Partner:   . Emotionally Abused:   Marland Kitchen Physically Abused:   . Sexually Abused:     Vital Signs: Blood pressure 116/66, pulse 60, temperature (!) 97.3 F (36.3 C), resp. rate 16, height 5\' 5"  (1.651 m), weight 187 lb (84.8 kg), SpO2 95 %.  Examination: General Appearance: The patient is well-developed, well-nourished, and in no distress. Skin: Gross inspection of skin unremarkable. Head: normocephalic, no gross deformities. Eyes: no gross deformities noted. ENT: ears appear grossly normal no exudates. Neck:  Supple. No thyromegaly. No LAD. Respiratory: Clear bilaterally. Cardiovascular: Normal S1 and S2 without murmur or rub. Extremities: No cyanosis. pulses are equal. Neurologic: Alert and oriented. No involuntary movements.  LABS: Recent Results (from the past 2160 hour(s))  CULTURE, URINE COMPREHENSIVE     Status: Abnormal   Collection Time: 12/29/19 12:00 AM   Specimen: Urine   URINE  Result Value Ref Range   Urine Culture, Comprehensive Final report (A)    Organism ID, Bacteria Klebsiella pneumoniae (A)     Comment: 25,000-50,000 colony forming units per mL Cefazolin <=4 ug/mL Cefazolin with an MIC <=16 predicts susceptibility to the oral agents cefaclor, cefdinir, cefpodoxime, cefprozil, cefuroxime, cephalexin, and loracarbef when used for therapy of uncomplicated urinary tract infections due to E. coli, Klebsiella pneumoniae, and Proteus mirabilis.    ANTIMICROBIAL SUSCEPTIBILITY Comment     Comment:       ** S = Susceptible; I = Intermediate; R = Resistant **                    P = Positive; N = Negative             MICS are expressed in micrograms per mL    Antibiotic                 RSLT#1    RSLT#2    RSLT#3    RSLT#4 Amoxicillin/Clavulanic Acid    S Ampicillin                      R Cefepime                       S Ceftriaxone                    S Cefuroxime                     I Ciprofloxacin                  S Ertapenem                      S Gentamicin                     S Imipenem                       S Levofloxacin                   I Meropenem                      S Nitrofurantoin                 R Piperacillin/Tazobactam        S Tetracycline                   I Tobramycin                     S Trimethoprim/Sulfa             S   POCT Urinalysis Dipstick     Status: Abnormal   Collection Time: 12/29/19 11:44 AM  Result Value Ref Range   Color, UA     Clarity, UA     Glucose, UA Negative Negative   Bilirubin, UA negative  Ketones, UA negative    Spec Grav, UA 1.010 1.010 - 1.025   Blood, UA negative    pH, UA 5.0 5.0 - 8.0   Protein, UA Negative Negative   Urobilinogen, UA 0.2 0.2 or 1.0 E.U./dL   Nitrite, UA negative    Leukocytes, UA Small (1+) (A) Negative   Appearance     Odor    POCT HgB A1C     Status: Abnormal   Collection Time: 12/29/19 11:46 AM  Result Value Ref Range   Hemoglobin A1C 6.0 (A) 4.0 - 5.6 %   HbA1c POC (<> result, manual entry)     HbA1c, POC (prediabetic range)     HbA1c, POC (controlled diabetic range)      Radiology: DG Abd 1 View  Result Date: 10/28/2019 CLINICAL DATA:  Left flank pain for 2 days. EXAM: ABDOMEN - 1 VIEW COMPARISON:  KUB 04/23/2019. FINDINGS: 0.4 cm calcification projecting over the upper pole of the left kidney compatible with a nonobstructing stone is unchanged. No other evidence of urinary tract stone is identified. Small calcification inferior to the right L3 transverse process is unchanged. Surgical clips in the right upper quadrant are noted. Bowel gas pattern is unremarkable. No acute or focal bony abnormality. IMPRESSION: Unchanged 0.4 cm calcification projecting over the upper pole of the right kidney compatible with a nonobstructing stone. No other evidence of urinary tract stone  is identified. Electronically Signed   By: Inge Rise M.D.   On: 10/28/2019 15:53    No results found.  No results found.    Assessment and Plan: Patient Active Problem List   Diagnosis Date Noted  . Chronic venous stasis 01/11/2020  . Atopic dermatitis 01/11/2020  . Diastolic dysfunction 51/09/5850  . Peripheral edema 09/10/2019  . Dyspnea on exertion 09/10/2019  . Muscle cramps 09/10/2019  . Type 2 diabetes mellitus with hyperglycemia ([place]) 05/18/2019  . Seasonal allergic rhinitis 05/18/2019  . Screening for breast cancer 05/18/2019  . Stenosis of left carotid artery 02/05/2019  . Acute upper respiratory infection 10/09/2018  . Flu-like symptoms 10/09/2018  . Sore throat 10/09/2018  . Vitamin D deficiency 07/27/2018  . Need for vaccination against Streptococcus pneumoniae using pneumococcal conjugate vaccine 7 07/27/2018  . Encounter for general adult medical examination with abnormal findings 04/07/2018  . Primary generalized (osteo)arthritis 04/07/2018  . Hematuria, microscopic 12/18/2017  . Renal colic 77/82/4235  . Urinary tract infection without hematuria 11/21/2017  . Dysuria 11/21/2017  . Uncontrolled type 2 diabetes mellitus with hyperglycemia ([place]) 11/21/2017  . Diabetes mellitus type 2, uncomplicated ([place]) 36/14/4315  . Osteoporosis 10/04/2017  . S/P total hip arthroplasty 06/14/2016  . Inguinal hernia, left 06/09/2013  . Umbilical hernia 40/03/6760  . Increased frequency of urination 11/27/2012  . Chronic cystitis 11/19/2012  . Incomplete emptying of bladder 11/19/2012  . Medullary sponge kidney 11/19/2012  . Mixed urge and stress incontinence 11/19/2012  . Anxiety 01/02/2012  . Calculus of kidney 01/02/2012  . Chronic obstructive pulmonary disease ([place]) 01/02/2012  . GERD (gastroesophageal reflux disease) 01/02/2012  . Hearing loss 01/02/2012  . Hyperlipidemia, unspecified 01/02/2012  . Obesity, unspecified 01/02/2012  . OSA on CPAP 01/02/2012   . Tobacco abuse 01/02/2012  . Venous insufficiency 01/02/2012    1. Chronic obstructive pulmonary disease, unspecified COPD type ([place]) Stop Spiriva, and start incruze as discussed. Will follow up to evaluate results.    2. OSA on CPAP Continue to use cpap when sleeping.   3. Seasonal allergic rhinitis, unspecified  trigger Controlled, continue current meds.   General Counseling: I have discussed the findings of the evaluation and examination with Kadia.  I have also discussed any further diagnostic evaluation thatmay be needed or ordered today. Micca verbalizes understanding of the findings of todays visit. We also reviewed her medications today and discussed drug interactions and side effects including but not limited excessive drowsiness and altered mental states. We also discussed that there is always a risk not just to her but also people around her. she has been encouraged to call the office with any questions or concerns that should arise related to todays visit.  No orders of the defined types were placed in this encounter.    Time spent: 25  I have personally obtained a history, examined the patient, evaluated laboratory and imaging results, formulated the assessment and plan and placed orders.    Allyne Gee, MD [facility] Pulmonary and Critical Care Sleep medicine

## 2020-03-10 ENCOUNTER — Telehealth: Payer: Self-pay

## 2020-03-10 NOTE — Telephone Encounter (Signed)
Confirmed appointment on 03/11/2020 and screened for covid. klh

## 2020-03-11 ENCOUNTER — Ambulatory Visit (INDEPENDENT_AMBULATORY_CARE_PROVIDER_SITE_OTHER): Payer: Medicare Other | Admitting: Nurse Practitioner

## 2020-03-11 ENCOUNTER — Other Ambulatory Visit: Payer: Self-pay

## 2020-03-11 ENCOUNTER — Encounter: Payer: Self-pay | Admitting: Nurse Practitioner

## 2020-03-11 VITALS — BP 108/55 | HR 71 | Temp 97.3°F | Resp 16 | Ht 65.0 in | Wt 187.0 lb

## 2020-03-11 DIAGNOSIS — J449 Chronic obstructive pulmonary disease, unspecified: Secondary | ICD-10-CM

## 2020-03-11 DIAGNOSIS — E1165 Type 2 diabetes mellitus with hyperglycemia: Secondary | ICD-10-CM | POA: Diagnosis not present

## 2020-03-11 DIAGNOSIS — R0600 Dyspnea, unspecified: Secondary | ICD-10-CM | POA: Diagnosis not present

## 2020-03-11 DIAGNOSIS — R0609 Other forms of dyspnea: Secondary | ICD-10-CM

## 2020-03-11 MED ORDER — INCRUSE ELLIPTA 62.5 MCG/INH IN AEPB: 1.0000 | INHALATION_SPRAY | Freq: Every day | RESPIRATORY_TRACT | 1 refills | Status: DC

## 2020-03-11 MED ORDER — ALBUTEROL SULFATE HFA 108 (90 BASE) MCG/ACT IN AERS: 2.0000 | INHALATION_SPRAY | Freq: Four times a day (QID) | RESPIRATORY_TRACT | 2 refills | Status: DC | PRN

## 2020-03-11 NOTE — Progress Notes (Signed)
[facility] [place], [place] [address]  Internal MEDICINE  Office Visit Note  Patient Name: Amber Holder  [id number]  [id number]  Date of Service: 03/24/2020  Chief Complaint  Patient presents with  . Medication Refill     The patient is here for acute visit. She wants to discuss inhaled medications. She states that she was recently started on Incruse due to COPD/asthma. She states that this medication works well for her and she is barely using her rescue inhaler. She states that after trial with the incruse, she did not have follow up for efficacy. She does like the medication and if possible, would like to have a new prescription sent to mail order pharmacy.   Pt is here for a sick visit.     Current Medication:  Outpatient Encounter Medications as of 03/11/2020  Medication Sig  . acetaminophen (TYLENOL) 500 MG chewable tablet Chew 1,000 mg by mouth every 8 (eight) hours as needed for pain.  Marland Kitchen albuterol (VENTOLIN HFA) 108 (90 Base) MCG/ACT inhaler Inhale 2 puffs into the lungs every 6 (six) hours as needed for wheezing or shortness of breath.  . cholecalciferol (VITAMIN D) 400 UNITS TABS tablet Take 2,000 Units by mouth daily.   . cholestyramine (QUESTRAN) 4 g packet Take by mouth.  . ciprofloxacin-dexamethasone (CIPRODEX) OTIC suspension APPLY 3 4 DROPS TO RIGHT EAR 2 3 TIMES DAILY FOR 1 WEEK THEN USE AS NEEDED  . dicyclomine (BENTYL) 10 MG capsule   . ergocalciferol (DRISDOL) 1.25 MG (50000 UT) capsule Take 1 capsule (50,000 Units total) by mouth once a week.  . furosemide (LASIX) 20 MG tablet Take 1 tablet (20 mg total) by mouth daily.  . hydrocortisone cream 0.5 % Apply topically.  . ibandronate (BONIVA) 150 MG tablet Take 1 tablet (150 mg total) by mouth every 30 (thirty) days.  . Magnesium 250 MG TABS Take by mouth daily.  . meloxicam (MOBIC) 7.5 MG tablet TAKE 1 TABLET (7.5 MG TOTAL) BY MOUTH 2 (TWO) TIMES A DAY.  . metFORMIN (GLUCOPHAGE) 500  MG tablet Take 0.5 tablets (250 mg total) by mouth daily with breakfast.  . mirabegron ER (MYRBETRIQ) 50 MG TB24 tablet Take 1 tablet (50 mg total) by mouth daily.  . montelukast (SINGULAIR) 10 MG tablet TAKE 1 TABLET BY MOUTH DAILY FOR ALLERGIES  . ONETOUCH VERIO test strip BLOOD SUGAR TESTING ONCE DAILY . DX E11.65  . pneumococcal 23 valent vaccine (PNEUMOVAX 23) 25 MCG/0.5ML injection Inject 0.68ml IM once  . potassium chloride (KLOR-CON) 8 MEQ tablet Take 8 mEq by mouth as needed (only take when taking Lasix).   Marland Kitchen triamcinolone cream (KENALOG) 0.1 % Apply 1 application topically 2 (two) times daily.  Marland Kitchen umeclidinium bromide (INCRUSE ELLIPTA) 62.5 MCG/INH AEPB Inhale 1 puff into the lungs daily.  . [DISCONTINUED] albuterol (PROVENTIL HFA;VENTOLIN HFA) 108 (90 Base) MCG/ACT inhaler Inhale 2 puffs into the lungs every 6 (six) hours as needed for wheezing or shortness of breath.  . [DISCONTINUED] umeclidinium bromide (INCRUSE ELLIPTA) 62.5 MCG/INH AEPB Inhale 1 puff into the lungs daily.  . [DISCONTINUED] umeclidinium bromide (INCRUSE ELLIPTA) 62.5 MCG/INH AEPB Inhale 1 puff into the lungs daily.   No facility-administered encounter medications on file as of 03/11/2020.      Medical History: Past Medical History:  Diagnosis Date  . Anxiety 01/02/2012  . Arthritis   . Asthma   . Chronic kidney disease    Kidney stones  . Colon polyps   . COPD (  chronic obstructive pulmonary disease) ([place])   . Diabetes mellitus without complication ([place])   . GERD (gastroesophageal reflux disease)   . HOH (hard of hearing)    Bilateral hearing aids  . Osteoporosis   . Sleep apnea    Does not use C-PAP on a regular basis  . Venous stasis     Today's Vitals   03/11/20 1131  BP: (!) 108/55  Pulse: 71  Resp: 16  Temp: (!) 97.3 F (36.3 C)  SpO2: 94%  Weight: 187 lb (84.8 kg)  Height: 5\' 5"  (1.651 m)   Body mass index is 31.12 kg/m.  Review of Systems  Constitutional: Negative for activity  change, chills, fatigue and unexpected weight change.  HENT: Negative for congestion, nosebleeds, postnasal drip, rhinorrhea and voice change.   Respiratory: Positive for shortness of breath and wheezing. Negative for cough and chest tightness.        Dyspnea on exertion which is stable   Cardiovascular: Positive for leg swelling. Negative for chest pain and palpitations.  Gastrointestinal: Negative for constipation, diarrhea, nausea and vomiting.  Endocrine: Negative for cold intolerance, heat intolerance, polydipsia and polyuria.       Blood sugars doing well   Musculoskeletal: Positive for back pain.  Skin: Negative for rash.  Allergic/Immunologic: Positive for environmental allergies.  Neurological: Negative for dizziness and headaches.  Hematological: Negative for adenopathy.  Psychiatric/Behavioral: Negative for behavioral problems, dysphoric mood and sleep disturbance. The patient is not nervous/anxious.     Physical Exam Vitals and nursing note reviewed.  Constitutional:      General: She is not in acute distress.    Appearance: Normal appearance.  HENT:     Head: Normocephalic and atraumatic.     Nose: Nose normal.  Eyes:     Pupils: Pupils are equal, round, and reactive to light.  Neck:     Vascular: No carotid bruit.  Cardiovascular:     Rate and Rhythm: Normal rate and regular rhythm.     Pulses: Normal pulses.     Heart sounds: Normal heart sounds.     Comments: B/l UE 2+ non-pitting edema Pulmonary:     Effort: Pulmonary effort is normal.     Breath sounds: Normal breath sounds.  Abdominal:     Palpations: Abdomen is soft.  Musculoskeletal:     Cervical back: Normal range of motion and neck supple.     Right lower leg: 2+ Edema present.     Left lower leg: 2+ Edema present.  Skin:    Capillary Refill: Capillary refill takes less than 2 seconds.  Neurological:     Mental Status: She is alert.  Psychiatric:        Mood and Affect: Mood normal.         Behavior: Behavior normal.        Thought Content: Thought content normal.        Judgment: Judgment normal.    Assessment/Plan: 1. Chronic obstructive pulmonary disease, unspecified COPD type ([place]) Continue to use incruse daily. Use rescue inhaler as needed and as prescribed. New prescriptions sent to her mail pharmacy today. Samples for Incruse were provided.  - albuterol (VENTOLIN HFA) 108 (90 Base) MCG/ACT inhaler; Inhale 2 puffs into the lungs every 6 (six) hours as needed for wheezing or shortness of breath.  Dispense: 18 g; Refill: 2 - umeclidinium bromide (INCRUSE ELLIPTA) 62.5 MCG/INH AEPB; Inhale 1 puff into the lungs daily.  Dispense: 45 each; Refill: 1  2. Dyspnea on  exertion currnetly stable. Continue to use incruse daily. Use rescue inhaler as needed and as prescribed. New prescriptions sent to her mail pharmacy today. Samples for Incruse were provided.   3. Type 2 diabetes mellitus with hyperglycemia, without long-term current use of insulin ([place]) Continue diabetic medication as prescribed   General Counseling: Amber Holder verbalizes understanding of the findings of todays visit and agrees with plan of treatment. I have discussed any further diagnostic evaluation that may be needed or ordered today. We also reviewed her medications today. she has been encouraged to call the office with any questions or concerns that should arise related to todays visit.    Counseling:  This patient was seen by [facility] with Dr Lavera Guise as a part of collaborative care agreement  Meds ordered this encounter  Medications  . albuterol (VENTOLIN HFA) 108 (90 Base) MCG/ACT inhaler    Sig: Inhale 2 puffs into the lungs every 6 (six) hours as needed for wheezing or shortness of breath.    Dispense:  18 g    Refill:  2    Order Specific Question:   Supervising Provider    Answer:   Lavera Guise [[id number]]  . DISCONTD: umeclidinium bromide (INCRUSE ELLIPTA) 62.5 MCG/INH AEPB     Sig: Inhale 1 puff into the lungs daily.    Dispense:  45 each    Refill:  1    Order Specific Question:   Supervising Provider    Answer:   Lavera Guise [[id number]]  . umeclidinium bromide (INCRUSE ELLIPTA) 62.5 MCG/INH AEPB    Sig: Inhale 1 puff into the lungs daily.    Dispense:  45 each    Refill:  1    Order Specific Question:   Supervising Provider    Answer:   Lavera Guise [[id number]]    Time spent: 25 Minutes

## 2020-03-15 DIAGNOSIS — Z872 Personal history of diseases of the skin and subcutaneous tissue: Secondary | ICD-10-CM | POA: Diagnosis not present

## 2020-03-15 DIAGNOSIS — Z86018 Personal history of other benign neoplasm: Secondary | ICD-10-CM | POA: Diagnosis not present

## 2020-03-15 DIAGNOSIS — L821 Other seborrheic keratosis: Secondary | ICD-10-CM | POA: Diagnosis not present

## 2020-03-15 DIAGNOSIS — D239 Other benign neoplasm of skin, unspecified: Secondary | ICD-10-CM | POA: Diagnosis not present

## 2020-03-15 DIAGNOSIS — L578 Other skin changes due to chronic exposure to nonionizing radiation: Secondary | ICD-10-CM | POA: Diagnosis not present

## 2020-04-05 ENCOUNTER — Other Ambulatory Visit: Payer: Self-pay | Admitting: Adult Health

## 2020-04-05 DIAGNOSIS — J449 Chronic obstructive pulmonary disease, unspecified: Secondary | ICD-10-CM

## 2020-04-19 ENCOUNTER — Telehealth: Payer: Self-pay | Admitting: Urology

## 2020-04-19 DIAGNOSIS — H663X1 Other chronic suppurative otitis media, right ear: Secondary | ICD-10-CM | POA: Diagnosis not present

## 2020-04-19 NOTE — Telephone Encounter (Signed)
Pt wants to know if she needs a KUB prior to apt on Thursday please advice

## 2020-04-20 NOTE — Telephone Encounter (Signed)
Called pt informed her that last KUB was in March showing small, stable stone unless pt has developed new sx should not need KUB. Per pt she has no new symptoms. Will just come in for appointment tomorrow.

## 2020-04-22 ENCOUNTER — Ambulatory Visit (INDEPENDENT_AMBULATORY_CARE_PROVIDER_SITE_OTHER): Payer: Medicare Other | Admitting: Urology

## 2020-04-22 ENCOUNTER — Other Ambulatory Visit: Payer: Self-pay

## 2020-04-22 ENCOUNTER — Encounter: Payer: Self-pay | Admitting: Urology

## 2020-04-22 VITALS — BP 116/64 | HR 72 | Ht 65.0 in | Wt 187.0 lb

## 2020-04-22 DIAGNOSIS — N200 Calculus of kidney: Secondary | ICD-10-CM

## 2020-04-22 DIAGNOSIS — N390 Urinary tract infection, site not specified: Secondary | ICD-10-CM | POA: Diagnosis not present

## 2020-04-22 DIAGNOSIS — N3281 Overactive bladder: Secondary | ICD-10-CM | POA: Diagnosis not present

## 2020-04-22 MED ORDER — NITROFURANTOIN MONOHYD MACRO 100 MG PO CAPS: 100.0000 mg | ORAL_CAPSULE | Freq: Every day | ORAL | 3 refills | Status: DC

## 2020-04-22 NOTE — Progress Notes (Signed)
° °  04/22/2020 1:20 PM   Amber Holder June 27, 1946 [id number]  Reason for visit: Follow up OAB, recurrent UTIs, nephrolithiasis  HPI: I saw Amber Holder in urology clinic for the above issues.  She is a 74 year old female with a history of recurrent UTIs who has been on long-term nitrofurantoin prophylaxis that was originally started by Dr. Jacqlyn Larsen.  She is happy with how she is doing on this medication and only gets around 1 UTI per year.  She underwent a microscopic hematuria work-up with Dr. Jacqlyn Larsen in 2019 which did not show any bladder lesions, and there was a 6 mm nonobstructing left upper pole stone on CT.  She has had intermittent overactive bladder symptoms of urgency, frequency, and urge incontinence, but did not have any improvement with Myrbetriq previously.  She is unable to associate the symptoms with any particular beverages or behavioral strategies.  She will go a few weeks doing very well without any leakage, the need to wear depends for a few days.  She has a history of a PCNL with Dr. Jacqlyn Larsen in 2014 on the left side.  She denies any recent stone events.  Last KUB in February 2021 showed stable left 6 mm upper pole stone-I personally reviewed the KUB and agree with those findings.  She denies any flank pain, but has low back pain that is worse with physical activity or prolonged sitting.  We again reviewed behavioral strategies today regarding her overactive bladder symptoms.  She would like to continue the Macrobid prophylaxis, and I encouraged her to consider discontinuing this at some point in the future, I recommended adding cranberry tablets for UTI prophylaxis with a goal of hopefully stopping Macrobid at some point in the next year.  We also discussed stone prevention strategies and return precautions at length.  RTC 1 year with KUB prior   Billey Co, MD  [facility] [address] [place], [place] [address] [phone]

## 2020-04-22 NOTE — Patient Instructions (Signed)
Take cranberry tablets to prevent UTI   Urinary Tract Infection, Adult A urinary tract infection (UTI) is an infection of any part of the urinary tract. The urinary tract includes:  The kidneys.  The ureters.  The bladder.  The urethra. These organs make, store, and get rid of pee (urine) in the body. What are the causes? This is caused by germs (bacteria) in your genital area. These germs grow and cause swelling (inflammation) of your urinary tract. What increases the risk? You are more likely to develop this condition if:  You have a small, thin tube (catheter) to drain pee.  You cannot control when you pee or poop (incontinence).  You are female, and: ? You use these methods to prevent pregnancy:  A medicine that kills sperm (spermicide).  A device that blocks sperm (diaphragm). ? You have low levels of a female hormone (estrogen). ? You are pregnant.  You have genes that add to your risk.  You are sexually active.  You take antibiotic medicines.  You have trouble peeing because of: ? A prostate that is bigger than normal, if you are female. ? A blockage in the part of your body that drains pee from the bladder (urethra). ? A kidney stone. ? A nerve condition that affects your bladder (neurogenic bladder). ? Not getting enough to drink. ? Not peeing often enough.  You have other conditions, such as: ? Diabetes. ? A weak disease-fighting system (immune system). ? Sickle cell disease. ? Gout. ? Injury of the spine. What are the signs or symptoms? Symptoms of this condition include:  Needing to pee right away (urgently).  Peeing often.  Peeing small amounts often.  Pain or burning when peeing.  Blood in the pee.  Pee that smells bad or not like normal.  Trouble peeing.  Pee that is cloudy.  Fluid coming from the vagina, if you are female.  Pain in the belly or lower back. Other symptoms include:  Throwing up (vomiting).  No urge to  eat.  Feeling mixed up (confused).  Being tired and grouchy (irritable).  A fever.  Watery poop (diarrhea). How is this treated? This condition may be treated with:  Antibiotic medicine.  Other medicines.  Drinking enough water. Follow these instructions at home:  Medicines  Take over-the-counter and prescription medicines only as told by your doctor.  If you were prescribed an antibiotic medicine, take it as told by your doctor. Do not stop taking it even if you start to feel better. General instructions  Make sure you: ? Pee until your bladder is empty. ? Do not hold pee for a long time. ? Empty your bladder after sex. ? Wipe from front to back after pooping if you are a female. Use each tissue one time when you wipe.  Drink enough fluid to keep your pee pale yellow.  Keep all follow-up visits as told by your doctor. This is important. Contact a doctor if:  You do not get better after 1-2 days.  Your symptoms go away and then come back. Get help right away if:  You have very bad back pain.  You have very bad pain in your lower belly.  You have a fever.  You are sick to your stomach (nauseous).  You are throwing up. Summary  A urinary tract infection (UTI) is an infection of any part of the urinary tract.  This condition is caused by germs in your genital area.  There are many risk factors for  a UTI. These include having a small, thin tube to drain pee and not being able to control when you pee or poop.  Treatment includes antibiotic medicines for germs.  Drink enough fluid to keep your pee pale yellow. This information is not intended to replace advice given to you by your health care provider. Make sure you discuss any questions you have with your health care provider. Document Revised: 08/01/2018 Document Reviewed: 02/21/2018 Elsevier Patient Education  2020 [employer].

## 2020-04-28 DIAGNOSIS — H6530 Chronic mucoid otitis media, unspecified ear: Secondary | ICD-10-CM | POA: Diagnosis not present

## 2020-04-28 DIAGNOSIS — H903 Sensorineural hearing loss, bilateral: Secondary | ICD-10-CM | POA: Diagnosis not present

## 2020-04-28 DIAGNOSIS — H7200 Central perforation of tympanic membrane, unspecified ear: Secondary | ICD-10-CM | POA: Diagnosis not present

## 2020-05-07 ENCOUNTER — Telehealth: Payer: Self-pay

## 2020-05-07 NOTE — Telephone Encounter (Signed)
LMOM for office visit on 9/13

## 2020-05-10 ENCOUNTER — Other Ambulatory Visit: Payer: Self-pay

## 2020-05-10 ENCOUNTER — Ambulatory Visit (INDEPENDENT_AMBULATORY_CARE_PROVIDER_SITE_OTHER): Payer: Medicare Other | Admitting: Nurse Practitioner

## 2020-05-10 VITALS — BP 128/82 | HR 71 | Temp 97.1°F | Resp 16 | Ht 65.0 in | Wt 183.6 lb

## 2020-05-10 DIAGNOSIS — J449 Chronic obstructive pulmonary disease, unspecified: Secondary | ICD-10-CM | POA: Diagnosis not present

## 2020-05-10 DIAGNOSIS — R300 Dysuria: Secondary | ICD-10-CM | POA: Diagnosis not present

## 2020-05-10 DIAGNOSIS — Z1231 Encounter for screening mammogram for malignant neoplasm of breast: Secondary | ICD-10-CM | POA: Diagnosis not present

## 2020-05-10 DIAGNOSIS — Z0001 Encounter for general adult medical examination with abnormal findings: Secondary | ICD-10-CM | POA: Diagnosis not present

## 2020-05-10 DIAGNOSIS — E1165 Type 2 diabetes mellitus with hyperglycemia: Secondary | ICD-10-CM | POA: Diagnosis not present

## 2020-05-10 DIAGNOSIS — Z9989 Dependence on other enabling machines and devices: Secondary | ICD-10-CM | POA: Diagnosis not present

## 2020-05-10 DIAGNOSIS — G4733 Obstructive sleep apnea (adult) (pediatric): Secondary | ICD-10-CM

## 2020-05-10 LAB — POCT GLYCOSYLATED HEMOGLOBIN (HGB A1C): Hemoglobin A1C: 5.9 % — AB (ref 4.0–5.6)

## 2020-05-10 LAB — POCT UA - MICROALBUMIN
Albumin/Creatinine Ratio, Urine, POC: <30
Creatinine, POC: 10 mg/dL
Microalbumin Ur, POC: 10 mg/L

## 2020-05-10 NOTE — Progress Notes (Signed)
[facility] [place], [place] [address]  Internal MEDICINE  Office Visit Note  Patient Name: Amber Holder  [id number]  [id number]  Date of Service: 05/26/2020   Pt is here for routine health maintenance examination  Chief Complaint  Patient presents with  . Medicare Wellness  . Diabetes  . Gastroesophageal Reflux  . Quality Metric Gaps    tetnaus, flu  . Wheezing     The patient is here for health maintenance exam. She states that she has noted increased wheezing. She is having to use rescue inhaler a bit more often. She states that she has been smoking cigarettes more often. She has increased personal stress. Taking care of her oldest son who has been sick for some time. He was recently in hospital for over five days. Blood sugars are well controlled. Her HgbA1c is 5.9 today. She is due to have a screening mammogram.     Current Medication: Outpatient Encounter Medications as of 05/10/2020  Medication Sig  . acetaminophen (TYLENOL) 500 MG chewable tablet Chew 1,000 mg by mouth every 8 (eight) hours as needed for pain.  Marland Kitchen albuterol (VENTOLIN HFA) 108 (90 Base) MCG/ACT inhaler Inhale 2 puffs into the lungs every 6 (six) hours as needed for wheezing or shortness of breath.  . cholecalciferol (VITAMIN D) 400 UNITS TABS tablet Take 2,000 Units by mouth daily.   Marland Kitchen dicyclomine (BENTYL) 10 MG capsule   . furosemide (LASIX) 20 MG tablet Take 1 tablet (20 mg total) by mouth daily.  . hydrocortisone cream 0.5 % Apply topically as needed.   . ibandronate (BONIVA) 150 MG tablet Take 1 tablet (150 mg total) by mouth every 30 (thirty) days.  . INCRUSE ELLIPTA 62.5 MCG/INH AEPB TAKE 1 PUFF BY MOUTH EVERY DAY  . Magnesium 250 MG TABS Take by mouth daily.  . meloxicam (MOBIC) 7.5 MG tablet TAKE 1 TABLET (7.5 MG TOTAL) BY MOUTH 2 (TWO) TIMES A DAY. (Patient taking differently: Take 7.5 mg by mouth as needed. )  . metFORMIN (GLUCOPHAGE) 500 MG tablet Take 0.5 tablets  (250 mg total) by mouth daily with breakfast. (Patient taking differently: Take 250 mg by mouth daily with breakfast. AS NEEDED)  . montelukast (SINGULAIR) 10 MG tablet TAKE 1 TABLET BY MOUTH DAILY FOR ALLERGIES  . nitrofurantoin, macrocrystal-monohydrate, (MACROBID) 100 MG capsule Take 1 capsule (100 mg total) by mouth daily.  Amber Holder VERIO test strip BLOOD SUGAR TESTING ONCE DAILY . DX E11.65  . pneumococcal 23 valent vaccine (PNEUMOVAX 23) 25 MCG/0.5ML injection Inject 0.59ml IM once  . potassium chloride (KLOR-CON) 8 MEQ tablet Take 8 mEq by mouth as needed (only take when taking Lasix).   Marland Kitchen tiotropium (SPIRIVA) 18 MCG inhalation capsule Place into inhaler and inhale.  . [DISCONTINUED] ciprofloxacin-dexamethasone (CIPRODEX) OTIC suspension APPLY 3 4 DROPS TO RIGHT EAR 2 3 TIMES DAILY FOR 1 WEEK THEN USE AS NEEDED  . cholestyramine (QUESTRAN) 4 g packet Take by mouth.   No facility-administered encounter medications on file as of 05/10/2020.    Surgical History: Past Surgical History:  Procedure Laterality Date  . APPENDECTOMY    . cataract surgery Bilateral   . CHOLECYSTECTOMY    . COLONOSCOPY    . COLONOSCOPY WITH PROPOFOL N/A 05/28/2015   Procedure: COLONOSCOPY WITH PROPOFOL;  Surgeon: Lollie Sails, MD;  Location: [facility] ENDOSCOPY;  Service: Endoscopy;  Laterality: N/A;  . COLONOSCOPY WITH PROPOFOL N/A 10/28/2018   Procedure: COLONOSCOPY WITH PROPOFOL;  Surgeon: Lollie Sails,  MD;  Location: [facility] ENDOSCOPY;  Service: Endoscopy;  Laterality: N/A;  . EYE SURGERY Bilateral 2015   Cataract Extraction with IOL  . LITHOTRIPSY  2015   has had many stones  . Mastoidotomy  1970  . ROTATOR CUFF REPAIR Right 2011  . TOTAL HIP ARTHROPLASTY Right 06/14/2016   Procedure: TOTAL HIP ARTHROPLASTY;  Surgeon: Dereck Leep, MD;  Location: [facility] ORS;  Service: Orthopedics;  Laterality: Right;    Medical History: Past Medical History:  Diagnosis Date  . Anxiety 01/02/2012  . Arthritis   .  Asthma   . Chronic kidney disease    Kidney stones  . Colon polyps   . COPD (chronic obstructive pulmonary disease) ([place])   . Diabetes mellitus without complication ([place])   . GERD (gastroesophageal reflux disease)   . HOH (hard of hearing)    Bilateral hearing aids  . Kidney stone   . Osteoporosis   . Sleep apnea    Does not use C-PAP on a regular basis  . Venous stasis     Family History: Family History  Problem Relation Age of Onset  . Breast cancer Maternal Aunt 64  . Bladder Cancer Neg Hx   . Kidney cancer Neg Hx       Review of Systems  Constitutional: Negative for activity change, chills, fatigue and unexpected weight change.  HENT: Negative for congestion, nosebleeds, postnasal drip, rhinorrhea and voice change.   Respiratory: Positive for shortness of breath and wheezing. Negative for cough and chest tightness.        Dyspnea on exertion which is stable   Cardiovascular: Positive for leg swelling. Negative for chest pain and palpitations.  Gastrointestinal: Negative for constipation, diarrhea, nausea and vomiting.  Endocrine: Negative for cold intolerance, heat intolerance, polydipsia and polyuria.       Blood sugars doing well   Genitourinary: Negative for dysuria, frequency and urgency.  Musculoskeletal: Negative for back pain.  Skin: Negative for rash.  Allergic/Immunologic: Positive for environmental allergies.  Neurological: Negative for dizziness and headaches.  Hematological: Negative for adenopathy.  Psychiatric/Behavioral: Negative for behavioral problems, dysphoric mood and sleep disturbance. The patient is not nervous/anxious.      Today's Vitals   05/10/20 1144  BP: 128/82  Pulse: 71  Resp: 16  Temp: (!) 97.1 F (36.2 C)  SpO2: 97%  Weight: 183 lb 9.6 oz (83.3 kg)  Height: 5\' 5"  (1.651 m)   Body mass index is 30.55 kg/m.  Physical Exam Vitals and nursing note reviewed.  Constitutional:      General: She is not in acute distress.     Appearance: Normal appearance.  HENT:     Head: Normocephalic and atraumatic.     Nose: Nose normal.  Eyes:     Pupils: Pupils are equal, round, and reactive to light.  Neck:     Vascular: No carotid bruit.  Cardiovascular:     Rate and Rhythm: Normal rate and regular rhythm.     Pulses: Normal pulses.          Dorsalis pedis pulses are 2+ on the right side and 2+ on the left side.       Posterior tibial pulses are 2+ on the right side and 2+ on the left side.     Heart sounds: Normal heart sounds.     Comments: B/l UE 2+ non-pitting edema Pulmonary:     Effort: Pulmonary effort is normal.     Breath sounds: Normal breath sounds.  Abdominal:  General: Bowel sounds are normal.     Palpations: Abdomen is soft.     Tenderness: There is no abdominal tenderness.  Musculoskeletal:     Cervical back: Normal range of motion and neck supple.     Right lower leg: 2+ Edema present.     Left lower leg: 2+ Edema present.     Right foot: Normal range of motion. No deformity or bunion.     Left foot: Normal range of motion. No deformity or bunion.  Feet:     Right foot:     Protective Sensation: 10 sites tested. 10 sites sensed.     Skin integrity: Skin integrity normal.     Toenail Condition: Right toenails are normal.     Left foot:     Protective Sensation: 10 sites tested. 10 sites sensed.     Skin integrity: Skin integrity normal.     Toenail Condition: Left toenails are normal.  Skin:    Capillary Refill: Capillary refill takes less than 2 seconds.  Neurological:     General: No focal deficit present.     Mental Status: She is alert and oriented to person, place, and time.  Psychiatric:        Mood and Affect: Mood normal.        Behavior: Behavior normal.        Thought Content: Thought content normal.        Judgment: Judgment normal.    Depression screen [facility] 2/9 05/10/2020 05/10/2020 03/11/2020 12/29/2019 05/09/2019  Decreased Interest 0 0 0 0 0  Down, Depressed, Hopeless 0  0 0 0 0  PHQ - 2 Score 0 0 0 0 0    Functional Status Survey: Is the patient deaf or have difficulty hearing?: No Does the patient have difficulty seeing, even when wearing glasses/contacts?: No Does the patient have difficulty concentrating, remembering, or making decisions?: No Does the patient have difficulty walking or climbing stairs?: No Does the patient have difficulty dressing or bathing?: No Does the patient have difficulty doing errands alone such as visiting a doctor's office or shopping?: No  MMSE - [place] Exam 05/10/2020 05/09/2019 03/19/2018  Orientation to time 5 5 5   Orientation to Place 5 5 5   Registration 3 3 3   Attention/ Calculation 5 5 5   Recall 3 3 3   Language- name 2 objects 2 2 2   Language- repeat 1 1 1   Language- follow 3 step command 3 3 3   Language- read & follow direction 1 1 1   Write a sentence 1 1 1   Copy design 1 1 1   Total score 30 30 30     Fall Risk  05/10/2020 05/10/2020 03/11/2020 12/29/2019 05/09/2019  Falls in the past year? 1 1 1  0 0  Number falls in past yr: 0 0 0 - -  Injury with Fall? - 0 0 - -      LABS: Recent Results (from the past 2160 hour(s))  UA/M w/rflx Culture, Routine     Status: None   Collection Time: 05/10/20 11:44 AM   Specimen: Urine   Urine  Result Value Ref Range   Specific Gravity, UA 1.011 1.005 - 1.030   pH, UA 5.5 5.0 - 7.5   Color, UA Yellow Yellow   Appearance Ur Clear Clear   Leukocytes,UA Negative Negative   Protein,UA Negative Negative/Trace   Glucose, UA Negative Negative   Ketones, UA Negative Negative   RBC, UA Negative Negative   Bilirubin, UA Negative  Negative   Urobilinogen, Ur 0.2 0.2 - 1.0 mg/dL   Nitrite, UA Negative Negative   Microscopic Examination Comment     Comment: Microscopic follows if indicated.   Microscopic Examination See below:     Comment: Microscopic was indicated and was performed.   Urinalysis Reflex Comment     Comment: This specimen has reflexed to a Urine  Culture.  Microscopic Examination     Status: Abnormal   Collection Time: 05/10/20 11:44 AM   Urine  Result Value Ref Range   WBC, UA 0-5 0 - 5 /hpf   RBC None seen 0 - 2 /hpf   Epithelial Cells (non renal) None seen 0 - 10 /hpf   Casts None seen None seen /lpf   Bacteria, UA Many (A) None seen/Few  Urine Culture, Reflex     Status: Abnormal   Collection Time: 05/10/20 11:44 AM   Urine  Result Value Ref Range   Urine Culture, Routine Final report (A)    Organism ID, Bacteria Klebsiella pneumoniae (A)     Comment: Greater than 100,000 colony forming units per mL   Antimicrobial Susceptibility Comment     Comment:       ** S = Susceptible; I = Intermediate; R = Resistant **                    P = Positive; N = Negative             MICS are expressed in micrograms per mL    Antibiotic                 RSLT#1    RSLT#2    RSLT#3    RSLT#4 Amoxicillin/Clavulanic Acid    S Ampicillin                     R Cefepime                       S Ceftriaxone                    S Cefuroxime                     I Ciprofloxacin                  S Ertapenem                      S Gentamicin                     S Imipenem                       S Levofloxacin                   I Meropenem                      S Nitrofurantoin                 R Piperacillin/Tazobactam        S Tetracycline                   I Tobramycin                     S Trimethoprim/Sulfa  R   POCT HgB A1C     Status: Abnormal   Collection Time: 05/10/20 12:02 PM  Result Value Ref Range   Hemoglobin A1C 5.9 (A) 4.0 - 5.6 %   HbA1c POC (<> result, manual entry)     HbA1c, POC (prediabetic range)     HbA1c, POC (controlled diabetic range)    POCT UA - Microalbumin     Status: None   Collection Time: 05/10/20 12:15 PM  Result Value Ref Range   Microalbumin Ur, POC 10 mg/L   Creatinine, POC 10 mg/dL   Albumin/Creatinine Ratio, Urine, POC <30     Assessment/Plan:  1. Encounter for general adult medical  examination with abnormal findings Annual health maintenance exam today. Order slip given to check routine, fasting labs.   2. Uncontrolled type 2 diabetes mellitus with hyperglycemia (HCC) - POCT HgB A1C 5.9 today. Continue all diabetic medication as prescribed.  - POCT UA - Microalbumin  3. Chronic obstructive pulmonary disease, unspecified COPD type ([place]) Stable. Continue inhalers and respiratory medications as prescribed   4. OSA on CPAP patinet should continue regular visits with Dr. Devona Konig for CPAP management.   5. Encounter for screening mammogram for malignant neoplasm of breast Screening mammogram to be scheduled   6. Dysuria - UA/M w/rflx Culture, Routine   General Counseling: Verdelle verbalizes understanding of the findings of todays visit and agrees with plan of treatment. I have discussed any further diagnostic evaluation that may be needed or ordered today. We also reviewed her medications today. she has been encouraged to call the office with any questions or concerns that should arise related to todays visit.    Counseling:  This patient was seen by Leretha Pol FNP Collaboration with Dr Lavera Guise as a part of collaborative care agreement  Orders Placed This Encounter  Procedures  . Microscopic Examination  . Urine Culture, Reflex  . UA/M w/rflx Culture, Routine  . POCT HgB A1C  . POCT UA - Microalbumin      Total time spent: 45 Minutes  Time spent includes review of chart, medications, test results, and follow up plan with the patient.     Lavera Guise, MD  Internal Medicine

## 2020-05-15 LAB — MICROSCOPIC EXAMINATION
Casts: NONE SEEN /lpf
Epithelial Cells (non renal): NONE SEEN /hpf (ref 0–10)
RBC, Urine: NONE SEEN /hpf (ref 0–2)

## 2020-05-15 LAB — UA/M W/RFLX CULTURE, ROUTINE
Bilirubin, UA: NEGATIVE
Glucose, UA: NEGATIVE
Ketones, UA: NEGATIVE
Leukocytes,UA: NEGATIVE
Nitrite, UA: NEGATIVE
Protein,UA: NEGATIVE
RBC, UA: NEGATIVE
Specific Gravity, UA: 1.011 (ref 1.005–1.030)
Urobilinogen, Ur: 0.2 mg/dL (ref 0.2–1.0)
pH, UA: 5.5 (ref 5.0–7.5)

## 2020-05-15 LAB — URINE CULTURE, REFLEX

## 2020-05-19 ENCOUNTER — Other Ambulatory Visit: Payer: Self-pay

## 2020-05-19 ENCOUNTER — Telehealth: Payer: Self-pay

## 2020-05-19 MED ORDER — CIPROFLOXACIN HCL 500 MG PO TABS
500.0000 mg | ORAL_TABLET | Freq: Two times a day (BID) | ORAL | 0 refills | Status: DC
Start: 2020-05-19 — End: 2020-06-22

## 2020-05-19 NOTE — Telephone Encounter (Signed)
Pt advised that urine did showed that she had UTI and we send cipro for 10 days also make pt follow up appt with [facility]

## 2020-05-19 NOTE — Telephone Encounter (Signed)
-----   Message from Lavera Guise, MD sent at 05/19/2020 12:32 PM EDT ----- UTI.. cipro 500 mg po bid x 10 days .Marland KitchenMarland Kitchen

## 2020-06-08 ENCOUNTER — Other Ambulatory Visit: Payer: Self-pay

## 2020-06-08 ENCOUNTER — Ambulatory Visit (INDEPENDENT_AMBULATORY_CARE_PROVIDER_SITE_OTHER): Payer: Medicare Other | Admitting: Internal Medicine

## 2020-06-08 ENCOUNTER — Encounter: Payer: Self-pay | Admitting: Internal Medicine

## 2020-06-08 DIAGNOSIS — J209 Acute bronchitis, unspecified: Secondary | ICD-10-CM | POA: Diagnosis not present

## 2020-06-08 DIAGNOSIS — Z9989 Dependence on other enabling machines and devices: Secondary | ICD-10-CM

## 2020-06-08 DIAGNOSIS — E119 Type 2 diabetes mellitus without complications: Secondary | ICD-10-CM | POA: Diagnosis not present

## 2020-06-08 DIAGNOSIS — J302 Other seasonal allergic rhinitis: Secondary | ICD-10-CM

## 2020-06-08 DIAGNOSIS — G4733 Obstructive sleep apnea (adult) (pediatric): Secondary | ICD-10-CM

## 2020-06-08 DIAGNOSIS — R300 Dysuria: Secondary | ICD-10-CM

## 2020-06-08 DIAGNOSIS — M810 Age-related osteoporosis without current pathological fracture: Secondary | ICD-10-CM

## 2020-06-08 DIAGNOSIS — J440 Chronic obstructive pulmonary disease with acute lower respiratory infection: Secondary | ICD-10-CM | POA: Diagnosis not present

## 2020-06-08 DIAGNOSIS — E782 Mixed hyperlipidemia: Secondary | ICD-10-CM | POA: Diagnosis not present

## 2020-06-08 DIAGNOSIS — N390 Urinary tract infection, site not specified: Secondary | ICD-10-CM

## 2020-06-08 LAB — POCT URINALYSIS DIPSTICK
Bilirubin, UA: NEGATIVE
Glucose, UA: NEGATIVE
Leukocytes, UA: NEGATIVE
Nitrite, UA: NEGATIVE
Protein, UA: POSITIVE — AB
Spec Grav, UA: 1.02 (ref 1.010–1.025)
Urobilinogen, UA: NEGATIVE E.U./dL — AB
pH, UA: 5 (ref 5.0–8.0)

## 2020-06-08 MED ORDER — AZITHROMYCIN 250 MG PO TABS: ORAL_TABLET | ORAL | 0 refills | Status: DC

## 2020-06-08 MED ORDER — IBANDRONATE SODIUM 150 MG PO TABS: 150.0000 mg | ORAL_TABLET | ORAL | 3 refills | Status: DC

## 2020-06-08 MED ORDER — FLOVENT HFA 110 MCG/ACT IN AERO: 2.0000 | INHALATION_SPRAY | Freq: Two times a day (BID) | RESPIRATORY_TRACT | 12 refills | Status: DC

## 2020-06-08 MED ORDER — MONTELUKAST SODIUM 10 MG PO TABS: ORAL_TABLET | ORAL | 3 refills | Status: DC

## 2020-06-08 NOTE — Progress Notes (Signed)
[facility] [place], [place] [address]  Internal MEDICINE  Office Visit Note  Patient Name: Amber Holder  [id number]  [id number]  Date of Service: 06/09/2020  Chief Complaint  Patient presents with  . Follow-up  . Urinary Tract Infection  . controlled substance form    reviewed with PT  . Asthma  . Hypertension    HPI Pt is here for follow up, was treated for UTI, she does feel better, however she thinks she since her Spiriva was changed she is not feeling hat much relief with her new inhaler, pt is having more cough and chest tightness. Pt has smoking history and has dx of COPD  Has not had PFT's done for a while, denies any fever or chills, has h/o osa, non aherence to CPAP, she has abnormal lipid profile, not on a statin    Current Medication: Outpatient Encounter Medications as of 06/08/2020  Medication Sig  . acetaminophen (TYLENOL) 500 MG chewable tablet Chew 1,000 mg by mouth every 8 (eight) hours as needed for pain.  Marland Kitchen albuterol (VENTOLIN HFA) 108 (90 Base) MCG/ACT inhaler Inhale 2 puffs into the lungs every 6 (six) hours as needed for wheezing or shortness of breath.  . cholecalciferol (VITAMIN D) 400 UNITS TABS tablet Take 2,000 Units by mouth daily.   . ciprofloxacin (CIPRO) 500 MG tablet Take 1 tablet (500 mg total) by mouth 2 (two) times daily.  Marland Kitchen dicyclomine (BENTYL) 10 MG capsule   . furosemide (LASIX) 20 MG tablet Take 1 tablet (20 mg total) by mouth daily.  . hydrocortisone cream 0.5 % Apply topically as needed.   . ibandronate (BONIVA) 150 MG tablet Take 1 tablet (150 mg total) by mouth every 30 (thirty) days.  . INCRUSE ELLIPTA 62.5 MCG/INH AEPB TAKE 1 PUFF BY MOUTH EVERY DAY  . Magnesium 250 MG TABS Take by mouth daily.  . meloxicam (MOBIC) 7.5 MG tablet TAKE 1 TABLET (7.5 MG TOTAL) BY MOUTH 2 (TWO) TIMES A DAY. (Patient taking differently: Take 7.5 mg by mouth as needed. )  . metFORMIN (GLUCOPHAGE) 500 MG tablet Take 0.5 tablets  (250 mg total) by mouth daily with breakfast. (Patient taking differently: Take 250 mg by mouth daily with breakfast. AS NEEDED)  . montelukast (SINGULAIR) 10 MG tablet TAKE 1 TABLET BY MOUTH DAILY FOR ALLERGIES  . nitrofurantoin, macrocrystal-monohydrate, (MACROBID) 100 MG capsule Take 1 capsule (100 mg total) by mouth daily.  Glory Rosebush VERIO test strip BLOOD SUGAR TESTING ONCE DAILY . DX E11.65  . pneumococcal 23 valent vaccine (PNEUMOVAX 23) 25 MCG/0.5ML injection Inject 0.29ml IM once  . potassium chloride (KLOR-CON) 8 MEQ tablet Take 8 mEq by mouth as needed (only take when taking Lasix).   . [DISCONTINUED] ibandronate (BONIVA) 150 MG tablet Take 1 tablet (150 mg total) by mouth every 30 (thirty) days.  . [DISCONTINUED] montelukast (SINGULAIR) 10 MG tablet TAKE 1 TABLET BY MOUTH DAILY FOR ALLERGIES  . [DISCONTINUED] tiotropium (SPIRIVA) 18 MCG inhalation capsule Place into inhaler and inhale.  Marland Kitchen azithromycin (ZITHROMAX) 250 MG tablet Take one tab a day for 10 days for uri  . cholestyramine (QUESTRAN) 4 g packet Take by mouth.  . fluticasone (FLOVENT HFA) 110 MCG/ACT inhaler Inhale 2 puffs into the lungs 2 (two) times daily. Use 2 puffs bid for congestion   No facility-administered encounter medications on file as of 06/08/2020.    Surgical History: Past Surgical History:  Procedure Laterality Date  . APPENDECTOMY    .  cataract surgery Bilateral   . CHOLECYSTECTOMY    . COLONOSCOPY    . COLONOSCOPY WITH PROPOFOL N/A 05/28/2015   Procedure: COLONOSCOPY WITH PROPOFOL;  Surgeon: Lollie Sails, MD;  Location: [facility] ENDOSCOPY;  Service: Endoscopy;  Laterality: N/A;  . COLONOSCOPY WITH PROPOFOL N/A 10/28/2018   Procedure: COLONOSCOPY WITH PROPOFOL;  Surgeon: Lollie Sails, MD;  Location: [facility] ENDOSCOPY;  Service: Endoscopy;  Laterality: N/A;  . EYE SURGERY Bilateral 2015   Cataract Extraction with IOL  . LITHOTRIPSY  2015   has had many stones  . Mastoidotomy  1970  . ROTATOR CUFF  REPAIR Right 2011  . TOTAL HIP ARTHROPLASTY Right 06/14/2016   Procedure: TOTAL HIP ARTHROPLASTY;  Surgeon: Dereck Leep, MD;  Location: [facility] ORS;  Service: Orthopedics;  Laterality: Right;    Medical History: Past Medical History:  Diagnosis Date  . Anxiety 01/02/2012  . Arthritis   . Asthma   . Chronic kidney disease    Kidney stones  . Colon polyps   . COPD (chronic obstructive pulmonary disease) ([place])   . Diabetes mellitus without complication ([place])   . GERD (gastroesophageal reflux disease)   . HOH (hard of hearing)    Bilateral hearing aids  . Kidney stone   . Osteoporosis   . Sleep apnea    Does not use C-PAP on a regular basis  . Venous stasis     Family History: Family History  Problem Relation Age of Onset  . Breast cancer Maternal Aunt 46  . Bladder Cancer Neg Hx   . Kidney cancer Neg Hx     Social History   Socioeconomic History  . Marital status: Divorced    Spouse name: Not on file  . Number of children: Not on file  . Years of education: Not on file  . Highest education level: Not on file  Occupational History  . Not on file  Tobacco Use  . Smoking status: Current Every Day Smoker    Packs/day: 1.00    Types: Cigarettes  . Smokeless tobacco: Never Used  Vaping Use  . Vaping Use: Never used  Substance and Sexual Activity  . Alcohol use: Yes    Comment: very rarely   . Drug use: No  . Sexual activity: Not Currently  Other Topics Concern  . Not on file  Social History Narrative  . Not on file   Social Determinants of Health   Financial Resource Strain:   . Difficulty of Paying Living Expenses: Not on file  Food Insecurity:   . Worried About Charity fundraiser in the Last Year: Not on file  . Ran Out of Food in the Last Year: Not on file  Transportation Needs:   . Lack of Transportation (Medical): Not on file  . Lack of Transportation (Non-Medical): Not on file  Physical Activity:   . Days of Exercise per Week: Not on file  .  Minutes of Exercise per Session: Not on file  Stress:   . Feeling of Stress : Not on file  Social Connections:   . Frequency of Communication with Friends and Family: Not on file  . Frequency of Social Gatherings with Friends and Family: Not on file  . Attends Religious Services: Not on file  . Active Member of Clubs or Organizations: Not on file  . Attends Archivist Meetings: Not on file  . Marital Status: Not on file  Intimate Partner Violence:   . Fear of Current or Ex-Partner:  Not on file  . Emotionally Abused: Not on file  . Physically Abused: Not on file  . Sexually Abused: Not on file      Review of Systems  Constitutional: Negative for chills, diaphoresis and fatigue.  HENT: Negative for ear pain, postnasal drip and sinus pressure.   Eyes: Negative for photophobia, discharge, redness, itching and visual disturbance.  Respiratory: Positive for cough. Negative for shortness of breath and wheezing.   Cardiovascular: Negative for chest pain, palpitations and leg swelling.  Gastrointestinal: Negative for abdominal pain, constipation, diarrhea, nausea and vomiting.  Genitourinary: Negative for dysuria and flank pain.  Musculoskeletal: Negative for arthralgias, back pain, gait problem and neck pain.  Skin: Negative for color change.  Allergic/Immunologic: Negative for environmental allergies and food allergies.  Neurological: Negative for dizziness and headaches.  Hematological: Does not bruise/bleed easily.  Psychiatric/Behavioral: Negative for agitation, behavioral problems (depression) and hallucinations.    Vital Signs: BP 122/74   Pulse 76   Resp 16   Ht 5\' 6"  (1.676 m)   Wt 184 lb (83.5 kg)   SpO2 96%   BMI 29.70 kg/m    Physical Exam Constitutional:      General: She is not in acute distress.    Appearance: She is well-developed. She is not diaphoretic.  HENT:     Head: Normocephalic and atraumatic.     Mouth/Throat:     Pharynx: No  oropharyngeal exudate.  Eyes:     Pupils: Pupils are equal, round, and reactive to light.  Neck:     Thyroid: No thyromegaly.     Vascular: No JVD.     Trachea: No tracheal deviation.  Cardiovascular:     Rate and Rhythm: Normal rate and regular rhythm.     Heart sounds: Normal heart sounds. No murmur heard.  No friction rub. No gallop.   Pulmonary:     Effort: Pulmonary effort is normal. No respiratory distress.     Breath sounds: Wheezing present. No rales.  Chest:     Chest wall: No tenderness.  Abdominal:     General: Bowel sounds are normal.     Palpations: Abdomen is soft.  Musculoskeletal:        General: Normal range of motion.     Cervical back: Normal range of motion and neck supple.  Lymphadenopathy:     Cervical: No cervical adenopathy.  Skin:    General: Skin is warm and dry.  Neurological:     Mental Status: She is alert and oriented to person, place, and time.     Cranial Nerves: No cranial nerve deficit.  Psychiatric:        Behavior: Behavior normal.        Thought Content: Thought content normal.        Judgment: Judgment normal.    Assessment/Plan: 1. Acute bronchitis with COPD ([place]) Pt will need PFT and CT chest if continues to show signs of worsening, add flovent  - azithromycin (ZITHROMAX) 250 MG tablet; Take one tab a day for 10 days for uri  Dispense: 10 tablet; Refill: 0 - fluticasone (FLOVENT HFA) 110 MCG/ACT inhaler; Inhale 2 puffs into the lungs 2 (two) times daily. Use 2 puffs bid for congestion  Dispense: 1 each; Refill: 12  2. Urinary tract infection without hematuria, site unspecified Will recheck urine  - POCT Urinalysis Dipstick - CULTURE, URINE COMPREHENSIVE  3. Diabetes mellitus without complication ([place]) Will change metformin to [place] on next visit   4. Mixed hyperlipidemia  Will start Lipitor on next visit as well  5. OSA on CPAP Look into compliance by bringing her back to sleep clinic   General Counseling: Mikayah  verbalizes understanding of the findings of todays visit and agrees with plan of treatment. I have discussed any further diagnostic evaluation that may be needed or ordered today. We also reviewed her medications today. she has been encouraged to call the office with any questions or concerns that should arise related to todays visit.  Orders Placed This Encounter  Procedures  . CULTURE, URINE COMPREHENSIVE  . POCT Urinalysis Dipstick    Meds ordered this encounter  Medications  . azithromycin (ZITHROMAX) 250 MG tablet    Sig: Take one tab a day for 10 days for uri    Dispense:  10 tablet    Refill:  0  . fluticasone (FLOVENT HFA) 110 MCG/ACT inhaler    Sig: Inhale 2 puffs into the lungs 2 (two) times daily. Use 2 puffs bid for congestion    Dispense:  1 each    Refill:  12    Total time spent:30 Minutes Time spent includes review of chart, medications, test results, and follow up plan with the patient.      Dr Lavera Guise Internal medicine

## 2020-06-11 LAB — CULTURE, URINE COMPREHENSIVE

## 2020-06-15 DIAGNOSIS — Z96641 Presence of right artificial hip joint: Secondary | ICD-10-CM | POA: Diagnosis not present

## 2020-06-15 DIAGNOSIS — E1165 Type 2 diabetes mellitus with hyperglycemia: Secondary | ICD-10-CM | POA: Diagnosis not present

## 2020-06-18 DIAGNOSIS — Z23 Encounter for immunization: Secondary | ICD-10-CM | POA: Diagnosis not present

## 2020-06-22 ENCOUNTER — Ambulatory Visit (INDEPENDENT_AMBULATORY_CARE_PROVIDER_SITE_OTHER): Payer: Medicare Other | Admitting: Internal Medicine

## 2020-06-22 ENCOUNTER — Encounter: Payer: Self-pay | Admitting: Internal Medicine

## 2020-06-22 ENCOUNTER — Other Ambulatory Visit: Payer: Self-pay

## 2020-06-22 VITALS — Resp 16 | Ht 66.0 in | Wt 185.0 lb

## 2020-06-22 DIAGNOSIS — J440 Chronic obstructive pulmonary disease with acute lower respiratory infection: Secondary | ICD-10-CM | POA: Diagnosis not present

## 2020-06-22 DIAGNOSIS — E782 Mixed hyperlipidemia: Secondary | ICD-10-CM | POA: Diagnosis not present

## 2020-06-22 DIAGNOSIS — E1165 Type 2 diabetes mellitus with hyperglycemia: Secondary | ICD-10-CM

## 2020-06-22 DIAGNOSIS — J209 Acute bronchitis, unspecified: Secondary | ICD-10-CM

## 2020-06-22 NOTE — Progress Notes (Signed)
[facility] [place],  [address]  Internal MEDICINE  Telephone Visit  Patient Name: Amber Holder  [id number]  [id number]  Date of Service: 06/23/2020  I connected with the patient at 1245 by telephone and verified the patients identity using two identifiers.   I discussed the limitations, risks, security and privacy concerns of performing an evaluation and management service by telephone and the availability of in person appointments. I also discussed with the patient that there may be a patient responsible charge related to the service.  The patient expressed understanding and agrees to proceed.    Chief Complaint  Patient presents with  . Telephone Screen    [phone] virtual   . Telephone Assessment  . Follow-up    UTI's  . Asthma    started on flovent last visit    HPI Pt is connected via video visit for follow up. Her cough is improved after finishing her Z pack, she thinks Flovent is helping her as well. Urine CX were negative for any growth, she seems a bit confused about her diabetic meds  Denies any fever or chills  Current Medication: Outpatient Encounter Medications as of 06/22/2020  Medication Sig  . acetaminophen (TYLENOL) 500 MG chewable tablet Chew 1,000 mg by mouth every 8 (eight) hours as needed for pain.  Marland Kitchen albuterol (VENTOLIN HFA) 108 (90 Base) MCG/ACT inhaler Inhale 2 puffs into the lungs every 6 (six) hours as needed for wheezing or shortness of breath.  . cholecalciferol (VITAMIN D) 400 UNITS TABS tablet Take 2,000 Units by mouth daily.   Marland Kitchen CRANBERRY PO Take 25,000 mg by mouth daily.  Marland Kitchen dicyclomine (BENTYL) 10 MG capsule   . fluticasone (FLOVENT HFA) 110 MCG/ACT inhaler Inhale 2 puffs into the lungs 2 (two) times daily. Use 2 puffs bid for congestion  . furosemide (LASIX) 20 MG tablet Take 1 tablet (20 mg total) by mouth daily.  . hydrocortisone cream 0.5 % Apply topically as needed.   . ibandronate (BONIVA) 150 MG tablet  Take 1 tablet (150 mg total) by mouth every 30 (thirty) days.  . INCRUSE ELLIPTA 62.5 MCG/INH AEPB TAKE 1 PUFF BY MOUTH EVERY DAY  . Magnesium 250 MG TABS Take by mouth daily.  . meloxicam (MOBIC) 7.5 MG tablet TAKE 1 TABLET (7.5 MG TOTAL) BY MOUTH 2 (TWO) TIMES A DAY. (Patient taking differently: Take 7.5 mg by mouth as needed. )  . montelukast (SINGULAIR) 10 MG tablet TAKE 1 TABLET BY MOUTH DAILY FOR ALLERGIES  . ONETOUCH VERIO test strip BLOOD SUGAR TESTING ONCE DAILY . DX E11.65  . pneumococcal 23 valent vaccine (PNEUMOVAX 23) 25 MCG/0.5ML injection Inject 0.70ml IM once  . potassium chloride (KLOR-CON) 8 MEQ tablet Take 8 mEq by mouth as needed (only take when taking Lasix).   . [DISCONTINUED] metFORMIN (GLUCOPHAGE) 500 MG tablet Take 0.5 tablets (250 mg total) by mouth daily with breakfast. (Patient taking differently: Take 250 mg by mouth daily with breakfast. AS NEEDED)  . atorvastatin (LIPITOR) 10 MG tablet Take one tab every other day for high cholesterol  . cholestyramine (QUESTRAN) 4 g packet Take by mouth.  . nitrofurantoin, macrocrystal-monohydrate, (MACROBID) 100 MG capsule Take 1 capsule (100 mg total) by mouth daily. (Patient not taking: Reported on 06/22/2020)  . [DISCONTINUED] azithromycin (ZITHROMAX) 250 MG tablet Take one tab a day for 10 days for uri (Patient not taking: Reported on 06/22/2020)  . [DISCONTINUED] ciprofloxacin (CIPRO) 500 MG tablet Take 1 tablet (500 mg total)  by mouth 2 (two) times daily. (Patient not taking: Reported on 06/22/2020)   No facility-administered encounter medications on file as of 06/22/2020.    Surgical History: Past Surgical History:  Procedure Laterality Date  . APPENDECTOMY    . cataract surgery Bilateral   . CHOLECYSTECTOMY    . COLONOSCOPY    . COLONOSCOPY WITH PROPOFOL N/A 05/28/2015   Procedure: COLONOSCOPY WITH PROPOFOL;  Surgeon: Lollie Sails, MD;  Location: [facility] ENDOSCOPY;  Service: Endoscopy;  Laterality: N/A;  .  COLONOSCOPY WITH PROPOFOL N/A 10/28/2018   Procedure: COLONOSCOPY WITH PROPOFOL;  Surgeon: Lollie Sails, MD;  Location: [facility] ENDOSCOPY;  Service: Endoscopy;  Laterality: N/A;  . EYE SURGERY Bilateral 2015   Cataract Extraction with IOL  . LITHOTRIPSY  2015   has had many stones  . Mastoidotomy  1970  . ROTATOR CUFF REPAIR Right 2011  . TOTAL HIP ARTHROPLASTY Right 06/14/2016   Procedure: TOTAL HIP ARTHROPLASTY;  Surgeon: Dereck Leep, MD;  Location: [facility] ORS;  Service: Orthopedics;  Laterality: Right;    Medical History: Past Medical History:  Diagnosis Date  . Anxiety 01/02/2012  . Arthritis   . Asthma   . Chronic kidney disease    Kidney stones  . Colon polyps   . COPD (chronic obstructive pulmonary disease) ([place])   . Diabetes mellitus without complication ([place])   . GERD (gastroesophageal reflux disease)   . HOH (hard of hearing)    Bilateral hearing aids  . Kidney stone   . Osteoporosis   . Sleep apnea    Does not use C-PAP on a regular basis  . Venous stasis     Family History: Family History  Problem Relation Age of Onset  . Breast cancer Maternal Aunt 63  . Bladder Cancer Neg Hx   . Kidney cancer Neg Hx     Social History   Socioeconomic History  . Marital status: Divorced    Spouse name: Not on file  . Number of children: Not on file  . Years of education: Not on file  . Highest education level: Not on file  Occupational History  . Not on file  Tobacco Use  . Smoking status: Current Every Day Smoker    Packs/day: 1.00    Types: Cigarettes  . Smokeless tobacco: Never Used  Vaping Use  . Vaping Use: Never used  Substance and Sexual Activity  . Alcohol use: Yes    Comment: very rarely   . Drug use: No  . Sexual activity: Not Currently  Other Topics Concern  . Not on file  Social History Narrative  . Not on file   Social Determinants of Health   Financial Resource Strain:   . Difficulty of Paying Living Expenses: Not on file  Food  Insecurity:   . Worried About Charity fundraiser in the Last Year: Not on file  . Ran Out of Food in the Last Year: Not on file  Transportation Needs:   . Lack of Transportation (Medical): Not on file  . Lack of Transportation (Non-Medical): Not on file  Physical Activity:   . Days of Exercise per Week: Not on file  . Minutes of Exercise per Session: Not on file  Stress:   . Feeling of Stress : Not on file  Social Connections:   . Frequency of Communication with Friends and Family: Not on file  . Frequency of Social Gatherings with Friends and Family: Not on file  . Attends Religious Services: Not  on file  . Active Member of Clubs or Organizations: Not on file  . Attends Archivist Meetings: Not on file  . Marital Status: Not on file  Intimate Partner Violence:   . Fear of Current or Ex-Partner: Not on file  . Emotionally Abused: Not on file  . Physically Abused: Not on file  . Sexually Abused: Not on file    Review of Systems  Constitutional: Negative.   HENT: Negative for facial swelling and mouth sores.   Respiratory: Positive for shortness of breath.   Cardiovascular: Positive for leg swelling.  Gastrointestinal: Negative.     Vital Signs: Resp 16   Ht 5\' 6"  (1.676 m)   Wt 185 lb (83.9 kg)   BMI 29.86 kg/m    Observation/Objective: NAD , improving   Assessment/Plan: 1. Acute bronchitis with COPD ([place]) Will continue all MDI including Flovent - Pulmonary Function Test; Future  2. Uncontrolled type 2 diabetes mellitus with hyperglycemia ([facility]) Will change metformin to [place] for dm   3. Mixed hyperlipidemia Start Lipitor 10 mg po qd   General Counseling: Sammantha verbalizes understanding of the findings of today's phone visit and agrees with plan of treatment. I have discussed any further diagnostic evaluation that may be needed or ordered today. We also reviewed her medications today. she has been encouraged to call the office with any questions or  concerns that should arise related to todays visit.   Orders Placed This Encounter  Procedures  . Pulmonary Function Test    Meds ordered this encounter  Medications  . atorvastatin (LIPITOR) 10 MG tablet    Sig: Take one tab every other day for high cholesterol    Dispense:  90 tablet    Refill:  3    Time spent: 30 Minutes Dr Lavera Guise Internal medicine

## 2020-06-23 MED ORDER — ATORVASTATIN CALCIUM 10 MG PO TABS: ORAL_TABLET | ORAL | 3 refills | Status: DC

## 2020-06-24 ENCOUNTER — Telehealth: Payer: Self-pay

## 2020-06-24 NOTE — Telephone Encounter (Signed)
Left VM telling pt that [facility] sent in cholesterol medication to her pharmacy

## 2020-06-24 NOTE — Telephone Encounter (Signed)
-----   Message from Lavera Guise, MD sent at 06/23/2020  3:55 PM EDT ----- Go ahead and tell her I am sending a chol medicine for her, she can take one tab every other day

## 2020-06-24 NOTE — Telephone Encounter (Signed)
Per [facility] advised pt to take 1/2 tablet of Lipitor twice a week until she sees [facility].  Pt made appt.

## 2020-06-30 ENCOUNTER — Other Ambulatory Visit: Payer: Self-pay | Admitting: Internal Medicine

## 2020-06-30 ENCOUNTER — Encounter: Payer: Self-pay | Admitting: Internal Medicine

## 2020-06-30 ENCOUNTER — Other Ambulatory Visit: Payer: Self-pay

## 2020-06-30 ENCOUNTER — Ambulatory Visit (INDEPENDENT_AMBULATORY_CARE_PROVIDER_SITE_OTHER): Payer: Medicare Other | Admitting: Internal Medicine

## 2020-06-30 DIAGNOSIS — E1165 Type 2 diabetes mellitus with hyperglycemia: Secondary | ICD-10-CM

## 2020-06-30 DIAGNOSIS — I700 Atherosclerosis of aorta: Secondary | ICD-10-CM

## 2020-06-30 DIAGNOSIS — I2510 Atherosclerotic heart disease of native coronary artery without angina pectoris: Secondary | ICD-10-CM

## 2020-06-30 DIAGNOSIS — J440 Chronic obstructive pulmonary disease with acute lower respiratory infection: Secondary | ICD-10-CM

## 2020-06-30 DIAGNOSIS — N390 Urinary tract infection, site not specified: Secondary | ICD-10-CM | POA: Diagnosis not present

## 2020-06-30 DIAGNOSIS — J209 Acute bronchitis, unspecified: Secondary | ICD-10-CM | POA: Diagnosis not present

## 2020-06-30 DIAGNOSIS — I2584 Coronary atherosclerosis due to calcified coronary lesion: Secondary | ICD-10-CM | POA: Diagnosis not present

## 2020-06-30 MED ORDER — SPIRIVA HANDIHALER 18 MCG IN CAPS: 18.0000 ug | ORAL_CAPSULE | Freq: Every day | RESPIRATORY_TRACT | 3 refills | Status: DC

## 2020-06-30 NOTE — Progress Notes (Signed)
[facility] [place], [place] [address]  Internal MEDICINE  Office Visit Note  Patient Name: Amber Holder  [id number]  [id number]  Date of Service: 07/05/2020  Chief Complaint  Patient presents with  . Follow-up    asthma  . Asthma  . COPD  . Diabetes    HPI Pt is here for follow up. She does not like to use another inhaler but Spiriva. Flovent is helping her as well. Cough is resolved  Pt is take metformin only as needed. Hg aic is improved. UTI resolved, will need to repeat c/s  H/o aortic atherosclerosis on Lipitor   OP on Boniva, will need repeat BMD  CT angio of chest reviewed from 2017 Background pattern of emphysema. Chronic benign scarring at the apices right more than left. Chronic stenosis right proximal main pulmonary artery with poststenotic dilatation. Aortic atherosclerosis.  Coronary artery atherosclerosis.  Current Medication: Outpatient Encounter Medications as of 06/30/2020  Medication Sig  . acetaminophen (TYLENOL) 500 MG chewable tablet Chew 1,000 mg by mouth every 8 (eight) hours as needed for pain.  Marland Kitchen albuterol (VENTOLIN HFA) 108 (90 Base) MCG/ACT inhaler Inhale 2 puffs into the lungs every 6 (six) hours as needed for wheezing or shortness of breath.  Marland Kitchen atorvastatin (LIPITOR) 10 MG tablet Take one tab every other day for high cholesterol  . cholecalciferol (VITAMIN D) 400 UNITS TABS tablet Take 2,000 Units by mouth daily.   Marland Kitchen CRANBERRY PO Take 25,000 mg by mouth daily.  Marland Kitchen dicyclomine (BENTYL) 10 MG capsule   . fluticasone (FLOVENT HFA) 110 MCG/ACT inhaler Inhale 2 puffs into the lungs 2 (two) times daily. Use 2 puffs bid for congestion  . furosemide (LASIX) 20 MG tablet Take 1 tablet (20 mg total) by mouth daily.  . hydrocortisone cream 0.5 % Apply topically as needed.   . ibandronate (BONIVA) 150 MG tablet Take 1 tablet (150 mg total) by mouth every 30 (thirty) days.  . Magnesium 250 MG TABS Take by mouth daily.  . meloxicam  (MOBIC) 7.5 MG tablet TAKE 1 TABLET (7.5 MG TOTAL) BY MOUTH 2 (TWO) TIMES A DAY. (Patient taking differently: Take 7.5 mg by mouth as needed. )  . montelukast (SINGULAIR) 10 MG tablet TAKE 1 TABLET BY MOUTH DAILY FOR ALLERGIES  . nitrofurantoin, macrocrystal-monohydrate, (MACROBID) 100 MG capsule Take 1 capsule (100 mg total) by mouth daily.  Glory Rosebush VERIO test strip BLOOD SUGAR TESTING ONCE DAILY . DX E11.65  . pneumococcal 23 valent vaccine (PNEUMOVAX 23) 25 MCG/0.5ML injection Inject 0.51ml IM once  . potassium chloride (KLOR-CON) 8 MEQ tablet Take 8 mEq by mouth as needed (only take when taking Lasix).   . [DISCONTINUED] INCRUSE ELLIPTA 62.5 MCG/INH AEPB TAKE 1 PUFF BY MOUTH EVERY DAY  . tiotropium (SPIRIVA HANDIHALER) 18 MCG inhalation capsule Place 1 capsule (18 mcg total) into inhaler and inhale daily. For copd  . [DISCONTINUED] cholestyramine (QUESTRAN) 4 g packet Take by mouth.   No facility-administered encounter medications on file as of 06/30/2020.    Surgical History: Past Surgical History:  Procedure Laterality Date  . APPENDECTOMY    . cataract surgery Bilateral   . CHOLECYSTECTOMY    . COLONOSCOPY    . COLONOSCOPY WITH PROPOFOL N/A 05/28/2015   Procedure: COLONOSCOPY WITH PROPOFOL;  Surgeon: Lollie Sails, MD;  Location: [facility] ENDOSCOPY;  Service: Endoscopy;  Laterality: N/A;  . COLONOSCOPY WITH PROPOFOL N/A 10/28/2018   Procedure: COLONOSCOPY WITH PROPOFOL;  Surgeon: Lollie Sails, MD;  Location: [facility] ENDOSCOPY;  Service: Endoscopy;  Laterality: N/A;  . EYE SURGERY Bilateral 2015   Cataract Extraction with IOL  . LITHOTRIPSY  2015   has had many stones  . Mastoidotomy  1970  . ROTATOR CUFF REPAIR Right 2011  . TOTAL HIP ARTHROPLASTY Right 06/14/2016   Procedure: TOTAL HIP ARTHROPLASTY;  Surgeon: Dereck Leep, MD;  Location: [facility] ORS;  Service: Orthopedics;  Laterality: Right;    Medical History: Past Medical History:  Diagnosis Date  . Anxiety 01/02/2012   . Arthritis   . Asthma   . Chronic kidney disease    Kidney stones  . Colon polyps   . COPD (chronic obstructive pulmonary disease) ([place])   . Diabetes mellitus without complication ([place])   . GERD (gastroesophageal reflux disease)   . HOH (hard of hearing)    Bilateral hearing aids  . Kidney stone   . Osteoporosis   . Sleep apnea    Does not use C-PAP on a regular basis  . Venous stasis     Family History: Family History  Problem Relation Age of Onset  . Breast cancer Maternal Aunt 48  . Bladder Cancer Neg Hx   . Kidney cancer Neg Hx     Social History   Socioeconomic History  . Marital status: Divorced    Spouse name: Not on file  . Number of children: Not on file  . Years of education: Not on file  . Highest education level: Not on file  Occupational History  . Not on file  Tobacco Use  . Smoking status: Current Every Day Smoker    Packs/day: 1.00    Types: Cigarettes  . Smokeless tobacco: Never Used  Vaping Use  . Vaping Use: Never used  Substance and Sexual Activity  . Alcohol use: Yes    Comment: very rarely   . Drug use: No  . Sexual activity: Not Currently  Other Topics Concern  . Not on file  Social History Narrative  . Not on file   Social Determinants of Health   Financial Resource Strain:   . Difficulty of Paying Living Expenses: Not on file  Food Insecurity:   . Worried About Charity fundraiser in the Last Year: Not on file  . Ran Out of Food in the Last Year: Not on file  Transportation Needs:   . Lack of Transportation (Medical): Not on file  . Lack of Transportation (Non-Medical): Not on file  Physical Activity:   . Days of Exercise per Week: Not on file  . Minutes of Exercise per Session: Not on file  Stress:   . Feeling of Stress : Not on file  Social Connections:   . Frequency of Communication with Friends and Family: Not on file  . Frequency of Social Gatherings with Friends and Family: Not on file  . Attends Religious  Services: Not on file  . Active Member of Clubs or Organizations: Not on file  . Attends Archivist Meetings: Not on file  . Marital Status: Not on file  Intimate Partner Violence:   . Fear of Current or Ex-Partner: Not on file  . Emotionally Abused: Not on file  . Physically Abused: Not on file  . Sexually Abused: Not on file    Review of Systems  Constitutional: Negative for chills, diaphoresis and fatigue.  HENT: Negative for ear pain, postnasal drip and sinus pressure.   Eyes: Negative for photophobia, discharge, redness, itching and visual disturbance.  Respiratory:  Positive for shortness of breath. Negative for cough and wheezing.   Cardiovascular: Negative for chest pain, palpitations and leg swelling.  Gastrointestinal: Negative for abdominal pain, constipation, diarrhea, nausea and vomiting.  Genitourinary: Negative for dysuria and flank pain.  Musculoskeletal: Negative for arthralgias, back pain, gait problem and neck pain.  Skin: Negative for color change.  Allergic/Immunologic: Negative for environmental allergies and food allergies.  Neurological: Negative for dizziness and headaches.  Hematological: Does not bruise/bleed easily.  Psychiatric/Behavioral: Negative for agitation, behavioral problems (depression) and hallucinations.    Vital Signs: BP 118/70   Pulse 74   Temp 97.8 F (36.6 C)   Resp 16   Ht 5\' 6"  (1.676 m)   Wt 187 lb (84.8 kg)   SpO2 95%   BMI 30.18 kg/m    Physical Exam Constitutional:      General: She is not in acute distress.    Appearance: She is well-developed. She is not diaphoretic.  HENT:     Head: Normocephalic and atraumatic.     Mouth/Throat:     Pharynx: No oropharyngeal exudate.  Eyes:     Pupils: Pupils are equal, round, and reactive to light.  Neck:     Thyroid: No thyromegaly.     Vascular: No JVD.     Trachea: No tracheal deviation.  Cardiovascular:     Rate and Rhythm: Normal rate and regular rhythm.      Heart sounds: Normal heart sounds. No murmur heard.  No friction rub. No gallop.   Pulmonary:     Effort: Pulmonary effort is normal. No respiratory distress.     Breath sounds: No wheezing or rales.  Chest:     Chest wall: No tenderness.  Abdominal:     General: Bowel sounds are normal.     Palpations: Abdomen is soft.  Musculoskeletal:        General: Normal range of motion.     Cervical back: Normal range of motion and neck supple.  Lymphadenopathy:     Cervical: No cervical adenopathy.  Skin:    General: Skin is warm and dry.  Neurological:     Mental Status: She is alert and oriented to person, place, and time.     Cranial Nerves: No cranial nerve deficit.  Psychiatric:        Behavior: Behavior normal.        Thought Content: Thought content normal.        Judgment: Judgment normal.    Assessment/Plan: 1. Acute bronchitis with COPD ([place]) Improved at this time. Will need her to be on Spiriva and Flovent  2. Uncontrolled type 2 diabetes mellitus with hyperglycemia (HCC) Continue Metformin and monitor CBG  3. Urinary tract infection without hematuria, site unspecified Urine sent for C/S  4. Aortic atherosclerosis (HCC) Continue lipitor as before   5. Coronary atherosclerosis due to calcified coronary lesion Pt will need CT angiogram of Coronary arteries   General Counseling: Yer verbalizes understanding of the findings of todays visit and agrees with plan of treatment. I have discussed any further diagnostic evaluation that may be needed or ordered today. We also reviewed her medications today. she has been encouraged to call the office with any questions or concerns that should arise related to todays visit.   Meds ordered this encounter  Medications  . tiotropium (SPIRIVA HANDIHALER) 18 MCG inhalation capsule    Sig: Place 1 capsule (18 mcg total) into inhaler and inhale daily. For copd    Dispense:  90  capsule    Refill:  3    Total time spent:  35   Minutes Time spent includes review of chart, medications, test results, and follow up plan with the patient.    Dr Lavera Guise Internal medicine

## 2020-07-01 ENCOUNTER — Other Ambulatory Visit: Payer: Self-pay | Admitting: Internal Medicine

## 2020-07-08 ENCOUNTER — Other Ambulatory Visit: Payer: Self-pay | Admitting: Internal Medicine

## 2020-07-08 NOTE — Telephone Encounter (Signed)
Pt has intolerance  to incruse

## 2020-07-09 ENCOUNTER — Telehealth: Payer: Self-pay

## 2020-07-09 MED ORDER — SPIRIVA HANDIHALER 18 MCG IN CAPS: 18.0000 ug | ORAL_CAPSULE | Freq: Every day | RESPIRATORY_TRACT | 3 refills | Status: DC

## 2020-07-09 NOTE — Telephone Encounter (Signed)
PA for Spiriva handihaler was approved from 04/09/2020 to 07/08/2021.  Pt notified.

## 2020-07-28 ENCOUNTER — Other Ambulatory Visit: Payer: Self-pay | Admitting: Adult Health

## 2020-08-04 ENCOUNTER — Other Ambulatory Visit: Payer: Self-pay

## 2020-08-04 ENCOUNTER — Ambulatory Visit (INDEPENDENT_AMBULATORY_CARE_PROVIDER_SITE_OTHER): Payer: Medicare Other | Admitting: Internal Medicine

## 2020-08-04 DIAGNOSIS — R0602 Shortness of breath: Secondary | ICD-10-CM

## 2020-08-04 DIAGNOSIS — J209 Acute bronchitis, unspecified: Secondary | ICD-10-CM

## 2020-08-04 LAB — PULMONARY FUNCTION TEST

## 2020-08-10 ENCOUNTER — Ambulatory Visit (INDEPENDENT_AMBULATORY_CARE_PROVIDER_SITE_OTHER): Payer: Medicare Other | Admitting: Nurse Practitioner

## 2020-08-10 ENCOUNTER — Encounter: Payer: Self-pay | Admitting: Nurse Practitioner

## 2020-08-10 ENCOUNTER — Other Ambulatory Visit: Payer: Self-pay

## 2020-08-10 VITALS — BP 138/64 | HR 70 | Temp 97.1°F | Resp 16 | Ht 66.0 in | Wt 187.2 lb

## 2020-08-10 DIAGNOSIS — E876 Hypokalemia: Secondary | ICD-10-CM | POA: Diagnosis not present

## 2020-08-10 DIAGNOSIS — J449 Chronic obstructive pulmonary disease, unspecified: Secondary | ICD-10-CM | POA: Diagnosis not present

## 2020-08-10 DIAGNOSIS — E1165 Type 2 diabetes mellitus with hyperglycemia: Secondary | ICD-10-CM

## 2020-08-10 DIAGNOSIS — R609 Edema, unspecified: Secondary | ICD-10-CM | POA: Diagnosis not present

## 2020-08-10 DIAGNOSIS — E782 Mixed hyperlipidemia: Secondary | ICD-10-CM | POA: Diagnosis not present

## 2020-08-10 DIAGNOSIS — E119 Type 2 diabetes mellitus without complications: Secondary | ICD-10-CM | POA: Diagnosis not present

## 2020-08-10 DIAGNOSIS — R300 Dysuria: Secondary | ICD-10-CM | POA: Diagnosis not present

## 2020-08-10 LAB — POCT GLYCOSYLATED HEMOGLOBIN (HGB A1C): Hemoglobin A1C: 5.9 % — AB (ref 4.0–5.6)

## 2020-08-10 LAB — POCT URINALYSIS DIPSTICK
Bilirubin, UA: NEGATIVE
Blood, UA: NEGATIVE
Glucose, UA: NEGATIVE
Ketones, UA: NEGATIVE
Leukocytes, UA: NEGATIVE
Nitrite, UA: NEGATIVE
Protein, UA: NEGATIVE
Spec Grav, UA: 1.025 (ref 1.010–1.025)
Urobilinogen, UA: 0.2 E.U./dL
pH, UA: 5 (ref 5.0–8.0)

## 2020-08-10 MED ORDER — ACCU-CHEK FASTCLIX LANCET KIT: PACK | 5 refills | Status: DC

## 2020-08-10 MED ORDER — POTASSIUM CHLORIDE ER 8 MEQ PO TBCR: 8.0000 meq | EXTENDED_RELEASE_TABLET | ORAL | 1 refills | Status: DC | PRN

## 2020-08-10 MED ORDER — FUROSEMIDE 20 MG PO TABS: 20.0000 mg | ORAL_TABLET | Freq: Every day | ORAL | 1 refills | Status: DC

## 2020-08-10 MED ORDER — ONETOUCH VERIO VI STRP
ORAL_STRIP | 3 refills | Status: DC
Start: 2020-08-10 — End: 2022-02-02

## 2020-08-10 NOTE — Progress Notes (Signed)
[facility] [place], [place] [address]  Internal MEDICINE  Office Visit Note  Patient Name: Amber Holder  [id number]  [id number]  Date of Service: 09/12/2020  Chief Complaint  Patient presents with  . Follow-up    Pt has chills, pt thinks she might have UTI, burning, bruising on hands, can pt get flu shot with pneumonia vax, left outside ankle randomly gets sharp pain and then pt loses strength of her foot, muscle pain in feet legs and cramps in hands, difficulty breathing sometimes, refill request  . Diabetes  . Gastroesophageal Reflux  . Sleep Apnea  . Anxiety  . Asthma  . COPD    The patient is here for follow up visit. Blood sugars are well managed. HgbA1c is 5.9 today. She is tolerating this medication well. Her blood pressure is well controlled. She states that her breathing is dong well, especially with addition of spiriva. She states that she has noted increased and easy bruising. This is mostly present on upper extremities and hands. Not taking any blood thinners.  -due to have routine, fasting labs  -would like to have urine sample checked as she does have history of frequent urinary tract infections.       Current Medication: Outpatient Encounter Medications as of 08/10/2020  Medication Sig  . acetaminophen (TYLENOL) 500 MG chewable tablet Chew 1,000 mg by mouth every 8 (eight) hours as needed for pain.  Marland Kitchen albuterol (VENTOLIN HFA) 108 (90 Base) MCG/ACT inhaler Inhale 2 puffs into the lungs every 6 (six) hours as needed for wheezing or shortness of breath.  Marland Kitchen atorvastatin (LIPITOR) 10 MG tablet Take one tab every other day for high cholesterol  . cholecalciferol (VITAMIN D) 400 UNITS TABS tablet Take 2,000 Units by mouth daily.   Marland Kitchen CRANBERRY PO Take 25,000 mg by mouth daily.  Marland Kitchen dicyclomine (BENTYL) 10 MG capsule   . fluticasone (FLOVENT HFA) 110 MCG/ACT inhaler Inhale 2 puffs into the lungs 2 (two) times daily. Use 2 puffs bid for congestion   . hydrocortisone cream 0.5 % Apply topically as needed.   . ibandronate (BONIVA) 150 MG tablet Take 1 tablet (150 mg total) by mouth every 30 (thirty) days.  . Magnesium 250 MG TABS Take by mouth daily.  . meloxicam (MOBIC) 7.5 MG tablet TAKE 1 TABLET (7.5 MG TOTAL) BY MOUTH 2 (TWO) TIMES A DAY. (Patient taking differently: Take 7.5 mg by mouth as needed.)  . montelukast (SINGULAIR) 10 MG tablet TAKE 1 TABLET BY MOUTH DAILY FOR ALLERGIES  . nitrofurantoin, macrocrystal-monohydrate, (MACROBID) 100 MG capsule Take 1 capsule (100 mg total) by mouth daily.  . pneumococcal 23 valent vaccine (PNEUMOVAX 23) 25 MCG/0.5ML injection Inject 0.17m IM once  . tiotropium (SPIRIVA HANDIHALER) 18 MCG inhalation capsule Place 1 capsule (18 mcg total) into inhaler and inhale daily. For copd  . [DISCONTINUED] furosemide (LASIX) 20 MG tablet Take 1 tablet (20 mg total) by mouth daily.  . [DISCONTINUED] ONETOUCH VERIO test strip BLOOD SUGAR TESTING ONCE DAILY . DX E11.65  . [DISCONTINUED] potassium chloride (KLOR-CON) 8 MEQ tablet Take 8 mEq by mouth as needed (only take when taking Lasix).   . furosemide (LASIX) 20 MG tablet Take 1 tablet (20 mg total) by mouth daily.  .Marland Kitchenglucose blood (ONETOUCH VERIO) test strip BLOOD SUGAR TESTING ONCE DAILY . DX E11.65  . Lancets Misc. (ACCU-CHEK FASTCLIX LANCET) KIT To use for daily blood sugar checks  DX - E11.65  . potassium chloride (KLOR-CON) 8 MEQ  tablet Take 1 tablet (8 mEq total) by mouth as needed (only take when taking Lasix).   No facility-administered encounter medications on file as of 08/10/2020.    Surgical History: Past Surgical History:  Procedure Laterality Date  . APPENDECTOMY    . cataract surgery Bilateral   . CHOLECYSTECTOMY    . COLONOSCOPY    . COLONOSCOPY WITH PROPOFOL N/A 05/28/2015   Procedure: COLONOSCOPY WITH PROPOFOL;  Surgeon: Lollie Sails, MD;  Location: [facility] ENDOSCOPY;  Service: Endoscopy;  Laterality: N/A;  . COLONOSCOPY WITH  PROPOFOL N/A 10/28/2018   Procedure: COLONOSCOPY WITH PROPOFOL;  Surgeon: Lollie Sails, MD;  Location: [facility] ENDOSCOPY;  Service: Endoscopy;  Laterality: N/A;  . EYE SURGERY Bilateral 2015   Cataract Extraction with IOL  . LITHOTRIPSY  2015   has had many stones  . Mastoidotomy  1970  . ROTATOR CUFF REPAIR Right 2011  . TOTAL HIP ARTHROPLASTY Right 06/14/2016   Procedure: TOTAL HIP ARTHROPLASTY;  Surgeon: Dereck Leep, MD;  Location: [facility] ORS;  Service: Orthopedics;  Laterality: Right;    Medical History: Past Medical History:  Diagnosis Date  . Anxiety 01/02/2012  . Arthritis   . Asthma   . Chronic kidney disease    Kidney stones  . Colon polyps   . COPD (chronic obstructive pulmonary disease) ([place])   . Diabetes mellitus without complication ([place])   . GERD (gastroesophageal reflux disease)   . HOH (hard of hearing)    Bilateral hearing aids  . Kidney stone   . Osteoporosis   . Sleep apnea    Does not use C-PAP on a regular basis  . Venous stasis     Family History: Family History  Problem Relation Age of Onset  . Breast cancer Maternal Aunt 20  . Bladder Cancer Neg Hx   . Kidney cancer Neg Hx     Social History   Socioeconomic History  . Marital status: Divorced    Spouse name: Not on file  . Number of children: Not on file  . Years of education: Not on file  . Highest education level: Not on file  Occupational History  . Not on file  Tobacco Use  . Smoking status: Current Every Day Smoker    Packs/day: 1.00    Types: Cigarettes  . Smokeless tobacco: Never Used  Vaping Use  . Vaping Use: Never used  Substance and Sexual Activity  . Alcohol use: Yes    Comment: very rarely   . Drug use: No  . Sexual activity: Not Currently  Other Topics Concern  . Not on file  Social History Narrative  . Not on file   Social Determinants of Health   Financial Resource Strain: Not on file  Food Insecurity: Not on file  Transportation Needs: Not on file   Physical Activity: Not on file  Stress: Not on file  Social Connections: Not on file  Intimate Partner Violence: Not on file      Review of Systems  Constitutional: Negative for chills, fatigue and unexpected weight change.  HENT: Negative for congestion, postnasal drip, rhinorrhea, sneezing and sore throat.   Respiratory: Negative for cough, chest tightness, shortness of breath and wheezing.   Cardiovascular: Negative for chest pain and palpitations.  Gastrointestinal: Negative for abdominal pain, constipation, diarrhea, nausea and vomiting.  Endocrine: Negative for cold intolerance, heat intolerance, polydipsia and polyuria.       Blood sugars doing well   Genitourinary: Negative for dysuria and frequency.  Musculoskeletal: Negative for arthralgias, back pain, joint swelling and neck pain.  Skin: Negative for rash.  Allergic/Immunologic: Negative for environmental allergies.  Neurological: Negative for dizziness, tremors, numbness and headaches.  Hematological: Negative for adenopathy. Does not bruise/bleed easily.  Psychiatric/Behavioral: Negative for behavioral problems (Depression), sleep disturbance and suicidal ideas. The patient is not nervous/anxious.     Today's Vitals   08/10/20 1145  BP: 138/64  Pulse: 70  Resp: 16  Temp: (!) 97.1 F (36.2 C)  SpO2: 95%  Weight: 187 lb 3.2 oz (84.9 kg)  Height: 5' 6"  (1.676 m)   Body mass index is 30.21 kg/m.  Physical Exam Vitals and nursing note reviewed.  Constitutional:      General: She is not in acute distress.    Appearance: Normal appearance. She is well-developed and well-nourished. She is not diaphoretic.  HENT:     Head: Normocephalic and atraumatic.     Mouth/Throat:     Mouth: Oropharynx is clear and moist.     Pharynx: No oropharyngeal exudate.  Eyes:     Extraocular Movements: EOM normal.     Pupils: Pupils are equal, round, and reactive to light.  Neck:     Thyroid: No thyromegaly.     Vascular: No  carotid bruit or JVD.     Trachea: No tracheal deviation.  Cardiovascular:     Rate and Rhythm: Normal rate and regular rhythm.     Heart sounds: Normal heart sounds. No murmur heard. No friction rub. No gallop.   Pulmonary:     Effort: Pulmonary effort is normal. No respiratory distress.     Breath sounds: Normal breath sounds. No wheezing or rales.  Chest:     Chest wall: No tenderness.  Abdominal:     Palpations: Abdomen is soft.  Genitourinary:    Comments: Urine sample negative for abnormalities or evidence of infection.  Musculoskeletal:        General: Normal range of motion.     Cervical back: Normal range of motion and neck supple.  Lymphadenopathy:     Cervical: No cervical adenopathy.  Skin:    General: Skin is warm and dry.  Neurological:     Mental Status: She is alert and oriented to person, place, and time. Mental status is at baseline.     Cranial Nerves: No cranial nerve deficit.  Psychiatric:        Mood and Affect: Mood and affect and mood normal.        Behavior: Behavior normal.        Thought Content: Thought content normal.        Judgment: Judgment normal.    Assessment/Plan: 1. Type 2 diabetes mellitus without complication, without long-term current use of insulin (HCC) - POCT HgB A1C 5.9 today. Patient should continue to monitor blood sugars closely. Recheck HgbA1c next visit.  - glucose blood (ONETOUCH VERIO) test strip; BLOOD SUGAR TESTING ONCE DAILY . DX E11.65  Dispense: 100 strip; Refill: 3 - Lancets Misc. (ACCU-CHEK FASTCLIX LANCET) KIT; To use for daily blood sugar checks  DX - E11.65  Dispense: 1 kit; Refill: 5   2. Chronic obstructive pulmonary disease, unspecified COPD type ([place]) Stable. Continue inhalers and respiratory medication as prescribed   3. Peripheral edema Continue furosemide 70m daily when needed. Refills provided today.  - furosemide (LASIX) 20 MG tablet; Take 1 tablet (20 mg total) by mouth daily.  Dispense: 90 tablet;  Refill: 1  4. Hypokalemia Continue potassium supplement as prescribed.  -  potassium chloride (KLOR-CON) 8 MEQ tablet; Take 1 tablet (8 mEq total) by mouth as needed (only take when taking Lasix).  Dispense: 90 tablet; Refill: 1  5. Dysuria - POCT Urinalysis Dipstick negative for abnormalities or infection at this time. Will monitor closely.   6. Mixed hyperlipidemia Continue atorvastatin as prescribed     General Counseling: Lilyonna verbalizes understanding of the findings of todays visit and agrees with plan of treatment. I have discussed any further diagnostic evaluation that may be needed or ordered today. We also reviewed her medications today. she has been encouraged to call the office with any questions or concerns that should arise related to todays visit.  This patient was seen by Leretha Pol FNP Collaboration with Dr Lavera Guise as a part of collaborative care agreement  Orders Placed This Encounter  Procedures  . POCT HgB A1C  . POCT Urinalysis Dipstick    Meds ordered this encounter  Medications  . potassium chloride (KLOR-CON) 8 MEQ tablet    Sig: Take 1 tablet (8 mEq total) by mouth as needed (only take when taking Lasix).    Dispense:  90 tablet    Refill:  1    Order Specific Question:   Supervising Provider    Answer:   Lavera Guise [[id number]]  . furosemide (LASIX) 20 MG tablet    Sig: Take 1 tablet (20 mg total) by mouth daily.    Dispense:  90 tablet    Refill:  1    Order Specific Question:   Supervising Provider    Answer:   Lavera Guise [[id number]]  . glucose blood (ONETOUCH VERIO) test strip    Sig: BLOOD SUGAR TESTING ONCE DAILY . DX E11.65    Dispense:  100 strip    Refill:  3    DX Code Needed  .    Order Specific Question:   Supervising Provider    Answer:   Lavera Guise [[id number]]  . Lancets Misc. (ACCU-CHEK FASTCLIX LANCET) KIT    Sig: To use for daily blood sugar checks  DX - E11.65    Dispense:  1 kit    Refill:  5    Order Specific Question:    Supervising Provider    Answer:   Lavera Guise [[id number]]    Total time spent: 30 Minutes   Time spent includes review of chart, medications, test results, and follow up plan with the patient.      Dr Lavera Guise Internal medicine

## 2020-08-12 ENCOUNTER — Encounter: Payer: Self-pay | Admitting: Internal Medicine

## 2020-08-12 DIAGNOSIS — Z23 Encounter for immunization: Secondary | ICD-10-CM | POA: Diagnosis not present

## 2020-08-12 NOTE — Procedures (Signed)
[facility] [place], [address]  DATE OF SERVICE: August 04, 2020  Complete Pulmonary Function Testing Interpretation:  FINDINGS:  Forced vital capacity is normal FEV1 is normal FEV1 FVC ratio is decreased total lung capacity is normal residual volume is normal.  FRC is decreased residual volume total capacity ratio is decreased.  DLCO was noted to be severely decreased.  Postbronchodilator no significant change  IMPRESSION:  This pulmonary function study is within normal limits.  Severe reduction in the DLCO was noted.  Allyne Gee, MD [facility] Medicine Sleep Medicine

## 2020-08-31 ENCOUNTER — Ambulatory Visit (INDEPENDENT_AMBULATORY_CARE_PROVIDER_SITE_OTHER): Payer: Medicare Other | Admitting: Hospice and Palliative Medicine

## 2020-08-31 ENCOUNTER — Encounter: Payer: Self-pay | Admitting: Hospice and Palliative Medicine

## 2020-08-31 ENCOUNTER — Other Ambulatory Visit: Payer: Self-pay

## 2020-08-31 VITALS — BP 132/80 | HR 76 | Temp 97.8°F | Resp 16 | Ht 66.0 in | Wt 191.0 lb

## 2020-08-31 DIAGNOSIS — J449 Chronic obstructive pulmonary disease, unspecified: Secondary | ICD-10-CM | POA: Diagnosis not present

## 2020-08-31 DIAGNOSIS — G4733 Obstructive sleep apnea (adult) (pediatric): Secondary | ICD-10-CM | POA: Diagnosis not present

## 2020-08-31 DIAGNOSIS — F17210 Nicotine dependence, cigarettes, uncomplicated: Secondary | ICD-10-CM

## 2020-08-31 NOTE — Patient Instructions (Signed)
Sleep Apnea Sleep apnea is a condition in which breathing pauses or becomes shallow during sleep. Episodes of sleep apnea usually last 10 seconds or longer, and they may occur as many as 20 times an hour. Sleep apnea disrupts your sleep and keeps your body from getting the rest that it needs. This condition can increase your risk of certain health problems, including:  Heart attack.  Stroke.  Obesity.  Diabetes.  Heart failure.  Irregular heartbeat. What are the causes? There are three kinds of sleep apnea:  Obstructive sleep apnea. This kind is caused by a blocked or collapsed airway.  Central sleep apnea. This kind happens when the part of the brain that controls breathing does not send the correct signals to the muscles that control breathing.  Mixed sleep apnea. This is a combination of obstructive and central sleep apnea. The most common cause of this condition is a collapsed or blocked airway. An airway can collapse or become blocked if:  Your throat muscles are abnormally relaxed.  Your tongue and tonsils are larger than normal.  You are overweight.  Your airway is smaller than normal. What increases the risk? You are more likely to develop this condition if you:  Are overweight.  Smoke.  Have a smaller than normal airway.  Are elderly.  Are female.  Drink alcohol.  Take sedatives or tranquilizers.  Have a family history of sleep apnea. What are the signs or symptoms? Symptoms of this condition include:  Trouble staying asleep.  Daytime sleepiness and tiredness.  Irritability.  Loud snoring.  Morning headaches.  Trouble concentrating.  Forgetfulness.  Decreased interest in sex.  Unexplained sleepiness.  Mood swings.  Personality changes.  Feelings of depression.  Waking up often during the night to urinate.  Dry mouth.  Sore throat. How is this diagnosed? This condition may be diagnosed with:  A medical history.  A physical  exam.  A series of tests that are done while you are sleeping (sleep study). These tests are usually done in a sleep lab, but they may also be done at home. How is this treated? Treatment for this condition aims to restore normal breathing and to ease symptoms during sleep. It may involve managing health issues that can affect breathing, such as high blood pressure or obesity. Treatment may include:  Sleeping on your side.  Using a decongestant if you have nasal congestion.  Avoiding the use of depressants, including alcohol, sedatives, and narcotics.  Losing weight if you are overweight.  Making changes to your diet.  Quitting smoking.  Using a device to open your airway while you sleep, such as: ? An oral appliance. This is a custom-made mouthpiece that shifts your lower jaw forward. ? A continuous positive airway pressure (CPAP) device. This device blows air through a mask when you breathe out (exhale). ? A nasal expiratory positive airway pressure (EPAP) device. This device has valves that you put into each nostril. ? A bi-level positive airway pressure (BPAP) device. This device blows air through a mask when you breathe in (inhale) and breathe out (exhale).  Having surgery if other treatments do not work. During surgery, excess tissue is removed to create a wider airway. It is important to get treatment for sleep apnea. Without treatment, this condition can lead to:  High blood pressure.  Coronary artery disease.  In men, an inability to achieve or maintain an erection (impotence).  Reduced thinking abilities. Follow these instructions at home: Lifestyle  Make any lifestyle changes   that your health care provider recommends.  Eat a healthy, well-balanced diet.  Take steps to lose weight if you are overweight.  Avoid using depressants, including alcohol, sedatives, and narcotics.  Do not use any products that contain nicotine or tobacco, such as cigarettes,  e-cigarettes, and chewing tobacco. If you need help quitting, ask your health care provider. General instructions  Take over-the-counter and prescription medicines only as told by your health care provider.  If you were given a device to open your airway while you sleep, use it only as told by your health care provider.  If you are having surgery, make sure to tell your health care provider you have sleep apnea. You may need to bring your device with you.  Keep all follow-up visits as told by your health care provider. This is important. Contact a health care provider if:  The device that you received to open your airway during sleep is uncomfortable or does not seem to be working.  Your symptoms do not improve.  Your symptoms get worse. Get help right away if:  You develop: ? Chest pain. ? Shortness of breath. ? Discomfort in your back, arms, or stomach.  You have: ? Trouble speaking. ? Weakness on one side of your body. ? Drooping in your face. These symptoms may represent a serious problem that is an emergency. Do not wait to see if the symptoms will go away. Get medical help right away. Call your local emergency services (911 in the [place] Do not drive yourself to the hospital. Summary  Sleep apnea is a condition in which breathing pauses or becomes shallow during sleep.  The most common cause is a collapsed or blocked airway.  The goal of treatment is to restore normal breathing and to ease symptoms during sleep. This information is not intended to replace advice given to you by your health care provider. Make sure you discuss any questions you have with your health care provider. Document Revised: 01/29/2019 Document Reviewed: 04/09/2018 Elsevier Patient Education  2020 Elsevier Inc.  

## 2020-08-31 NOTE — Progress Notes (Signed)
[facility] [place], [place] [address]  Pulmonary Sleep Medicine   Office Visit Note  Patient Name: Keyani Rigdon DOB: September 15, 1945 MRN [id number]  Date of Service: 08/31/2020  Complaints/HPI: Patient is here for routine pulmonary follow-up Reviewed recent PFT testing--within normal limits, severely decreased DLCO Breathing remains stable, continues to have intermittent shortness of breath with exertion but recovers quickly with rest Unfortunately she does continue to smoke, she has actually been smoking more, about 2.5-3 packs per day History of OSA--has tried CPAP multiple times but unable to tolerate, noise disrupts her dog which then disrupts her sleeping, also struggled getting used to the headgear Continues to use Spiriva and Flovent, rarely feels the need to use rescue albuterol inhaler  ROS  General: (-) fever, (-) chills, (-) night sweats, (-) weakness Skin: (-) rashes, (-) itching,. Eyes: (-) visual changes, (-) redness, (-) itching. Nose and Sinuses: (-) nasal stuffiness or itchiness, (-) postnasal drip, (-) nosebleeds, (-) sinus trouble. Mouth and Throat: (-) sore throat, (-) hoarseness. Neck: (-) swollen glands, (-) enlarged thyroid, (-) neck pain. Respiratory: - cough, (-) bloody sputum, + shortness of breath, - wheezing. Cardiovascular:- ankle swelling, (-) chest pain. Lymphatic: (-) lymph node enlargement. Neurologic: (-) numbness, (-) tingling. Psychiatric: (-) anxiety, (-) depression   Current Medication: Outpatient Encounter Medications as of 08/31/2020  Medication Sig  . acetaminophen (TYLENOL) 500 MG chewable tablet Chew 1,000 mg by mouth every 8 (eight) hours as needed for pain.  Marland Kitchen albuterol (VENTOLIN HFA) 108 (90 Base) MCG/ACT inhaler Inhale 2 puffs into the lungs every 6 (six) hours as needed for wheezing or shortness of breath.  Marland Kitchen atorvastatin (LIPITOR) 10 MG tablet Take one tab every other day for high cholesterol  .  cholecalciferol (VITAMIN D) 400 UNITS TABS tablet Take 2,000 Units by mouth daily.   Marland Kitchen CRANBERRY PO Take 25,000 mg by mouth daily.  Marland Kitchen dicyclomine (BENTYL) 10 MG capsule   . fluticasone (FLOVENT HFA) 110 MCG/ACT inhaler Inhale 2 puffs into the lungs 2 (two) times daily. Use 2 puffs bid for congestion  . furosemide (LASIX) 20 MG tablet Take 1 tablet (20 mg total) by mouth daily.  Marland Kitchen glucose blood (ONETOUCH VERIO) test strip BLOOD SUGAR TESTING ONCE DAILY . DX E11.65  . hydrocortisone cream 0.5 % Apply topically as needed.   . ibandronate (BONIVA) 150 MG tablet Take 1 tablet (150 mg total) by mouth every 30 (thirty) days.  . Lancets Misc. (ACCU-CHEK FASTCLIX LANCET) KIT To use for daily blood sugar checks  DX - E11.65  . Magnesium 250 MG TABS Take by mouth daily.  . meloxicam (MOBIC) 7.5 MG tablet TAKE 1 TABLET (7.5 MG TOTAL) BY MOUTH 2 (TWO) TIMES A DAY. (Patient taking differently: Take 7.5 mg by mouth as needed.)  . montelukast (SINGULAIR) 10 MG tablet TAKE 1 TABLET BY MOUTH DAILY FOR ALLERGIES  . nitrofurantoin, macrocrystal-monohydrate, (MACROBID) 100 MG capsule Take 1 capsule (100 mg total) by mouth daily.  . pneumococcal 23 valent vaccine (PNEUMOVAX 23) 25 MCG/0.5ML injection Inject 0.64m IM once  . potassium chloride (KLOR-CON) 8 MEQ tablet Take 1 tablet (8 mEq total) by mouth as needed (only take when taking Lasix).  .Marland Kitchentiotropium (SPIRIVA HANDIHALER) 18 MCG inhalation capsule Place 1 capsule (18 mcg total) into inhaler and inhale daily. For copd   No facility-administered encounter medications on file as of 08/31/2020.    Surgical History: Past Surgical History:  Procedure Laterality Date  . APPENDECTOMY    .  cataract surgery Bilateral   . CHOLECYSTECTOMY    . COLONOSCOPY    . COLONOSCOPY WITH PROPOFOL N/A 05/28/2015   Procedure: COLONOSCOPY WITH PROPOFOL;  Surgeon: Lollie Sails, MD;  Location: [facility] ENDOSCOPY;  Service: Endoscopy;  Laterality: N/A;  . COLONOSCOPY WITH PROPOFOL N/A  10/28/2018   Procedure: COLONOSCOPY WITH PROPOFOL;  Surgeon: Lollie Sails, MD;  Location: [facility] ENDOSCOPY;  Service: Endoscopy;  Laterality: N/A;  . EYE SURGERY Bilateral 2015   Cataract Extraction with IOL  . LITHOTRIPSY  2015   has had many stones  . Mastoidotomy  1970  . ROTATOR CUFF REPAIR Right 2011  . TOTAL HIP ARTHROPLASTY Right 06/14/2016   Procedure: TOTAL HIP ARTHROPLASTY;  Surgeon: Dereck Leep, MD;  Location: [facility] ORS;  Service: Orthopedics;  Laterality: Right;    Medical History: Past Medical History:  Diagnosis Date  . Anxiety 01/02/2012  . Arthritis   . Asthma   . Chronic kidney disease    Kidney stones  . Colon polyps   . COPD (chronic obstructive pulmonary disease) ([place])   . Diabetes mellitus without complication ([place])   . GERD (gastroesophageal reflux disease)   . HOH (hard of hearing)    Bilateral hearing aids  . Kidney stone   . Osteoporosis   . Sleep apnea    Does not use C-PAP on a regular basis  . Venous stasis     Family History: Family History  Problem Relation Age of Onset  . Breast cancer Maternal Aunt 61  . Bladder Cancer Neg Hx   . Kidney cancer Neg Hx     Social History: Social History   Socioeconomic History  . Marital status: Divorced    Spouse name: Not on file  . Number of children: Not on file  . Years of education: Not on file  . Highest education level: Not on file  Occupational History  . Not on file  Tobacco Use  . Smoking status: Current Every Day Smoker    Packs/day: 1.00    Types: Cigarettes  . Smokeless tobacco: Never Used  Vaping Use  . Vaping Use: Never used  Substance and Sexual Activity  . Alcohol use: Yes    Comment: very rarely   . Drug use: No  . Sexual activity: Not Currently  Other Topics Concern  . Not on file  Social History Narrative  . Not on file   Social Determinants of Health   Financial Resource Strain: Not on file  Food Insecurity: Not on file  Transportation Needs: Not on file   Physical Activity: Not on file  Stress: Not on file  Social Connections: Not on file  Intimate Partner Violence: Not on file    Vital Signs: Blood pressure 132/80, pulse 76, temperature 97.8 F (36.6 C), resp. rate 16, height 5' 6"  (1.676 m), weight 191 lb (86.6 kg), SpO2 96 %.  Examination: General Appearance: The patient is well-developed, well-nourished, and in no distress. Skin: Gross inspection of skin unremarkable. Head: normocephalic, no gross deformities. Eyes: no gross deformities noted. ENT: ears appear grossly normal no exudates. Neck: Supple. No thyromegaly. No LAD. Respiratory: Clear throughout, no rhonchi, rales or wheezing noted. Cardiovascular: Normal S1 and S2 without murmur or rub. Extremities: No cyanosis. pulses are equal. Neurologic: Alert and oriented. No involuntary movements.  LABS: Recent Results (from the past 2160 hour(s))  POCT Urinalysis Dipstick     Status: Abnormal   Collection Time: 06/08/20 10:41 AM  Result Value Ref Range  Color, UA     Clarity, UA     Glucose, UA Negative Negative   Bilirubin, UA negative    Ketones, UA trace    Spec Grav, UA 1.020 1.010 - 1.025   Blood, UA trace    pH, UA 5.0 5.0 - 8.0   Protein, UA Positive (A) Negative   Urobilinogen, UA negative (A) 0.2 or 1.0 E.U./dL   Nitrite, UA negative    Leukocytes, UA Negative Negative   Appearance     Odor    CULTURE, URINE COMPREHENSIVE     Status: None   Collection Time: 06/08/20 10:50 AM   Specimen: Urine   Urine  Result Value Ref Range   Urine Culture, Comprehensive Final report    Organism ID, Bacteria Comment     Comment: Mixed urogenital flora 10,000-25,000 colony forming units per mL   Pulmonary Function Test     Status: None   Collection Time: 08/04/20 12:00 AM  Result Value Ref Range   FEV1     FVC     FEV1/FVC     TLC     DLCO    POCT HgB A1C     Status: Abnormal   Collection Time: 08/10/20 12:08 PM  Result Value Ref Range   Hemoglobin A1C 5.9  (A) 4.0 - 5.6 %   HbA1c POC (<> result, manual entry)     HbA1c, POC (prediabetic range)     HbA1c, POC (controlled diabetic range)    POCT Urinalysis Dipstick     Status: Normal   Collection Time: 08/10/20  2:37 PM  Result Value Ref Range   Color, UA     Clarity, UA     Glucose, UA Negative Negative   Bilirubin, UA neg    Ketones, UA neg    Spec Grav, UA 1.025 1.010 - 1.025   Blood, UA neg    pH, UA 5.0 5.0 - 8.0   Protein, UA Negative Negative   Urobilinogen, UA 0.2 0.2 or 1.0 E.U./dL   Nitrite, UA neg    Leukocytes, UA Negative Negative   Appearance     Odor      Radiology: DG Abd 1 View  Result Date: 10/28/2019 CLINICAL DATA:  Left flank pain for 2 days. EXAM: ABDOMEN - 1 VIEW COMPARISON:  KUB 04/23/2019. FINDINGS: 0.4 cm calcification projecting over the upper pole of the left kidney compatible with a nonobstructing stone is unchanged. No other evidence of urinary tract stone is identified. Small calcification inferior to the right L3 transverse process is unchanged. Surgical clips in the right upper quadrant are noted. Bowel gas pattern is unremarkable. No acute or focal bony abnormality. IMPRESSION: Unchanged 0.4 cm calcification projecting over the upper pole of the right kidney compatible with a nonobstructing stone. No other evidence of urinary tract stone is identified. Electronically Signed   By: Inge Rise M.D.   On: 10/28/2019 15:53    No results found.  No results found.    Assessment and Plan: Patient Active Problem List   Diagnosis Date Noted  . Chronic venous stasis 01/11/2020  . Atopic dermatitis 01/11/2020  . Diastolic dysfunction 43/32/9518  . Peripheral edema 09/10/2019  . Dyspnea on exertion 09/10/2019  . Muscle cramps 09/10/2019  . Type 2 diabetes mellitus with hyperglycemia ([place]) 05/18/2019  . Seasonal allergic rhinitis 05/18/2019  . Screening for breast cancer 05/18/2019  . Stenosis of left carotid artery 02/05/2019  . Acute upper  respiratory infection 10/09/2018  . Flu-like symptoms  10/09/2018  . Sore throat 10/09/2018  . Vitamin D deficiency 07/27/2018  . Need for vaccination against Streptococcus pneumoniae using pneumococcal conjugate vaccine 7 07/27/2018  . Encounter for general adult medical examination with abnormal findings 04/07/2018  . Primary generalized (osteo)arthritis 04/07/2018  . Hematuria, microscopic 12/18/2017  . Renal colic 08/12/2445  . Urinary tract infection without hematuria 11/21/2017  . Dysuria 11/21/2017  . Uncontrolled type 2 diabetes mellitus with hyperglycemia ([place]) 11/21/2017  . Diabetes mellitus type 2, uncomplicated ([place]) 95/02/2256  . Osteoporosis 10/04/2017  . S/P total hip arthroplasty 06/14/2016  . Inguinal hernia, left 06/09/2013  . Umbilical hernia 50/51/8335  . Increased frequency of urination 11/27/2012  . Chronic cystitis 11/19/2012  . Incomplete emptying of bladder 11/19/2012  . Medullary sponge kidney 11/19/2012  . Mixed urge and stress incontinence 11/19/2012  . Anxiety 01/02/2012  . Calculus of kidney 01/02/2012  . Chronic obstructive pulmonary disease ([place]) 01/02/2012  . GERD (gastroesophageal reflux disease) 01/02/2012  . Hearing loss 01/02/2012  . Hyperlipidemia, unspecified 01/02/2012  . Obesity, unspecified 01/02/2012  . OSA on CPAP 01/02/2012  . Tobacco abuse 01/02/2012  . Venous insufficiency 01/02/2012    1. OSA (obstructive sleep apnea) Encouraged to retry CPAP therapy--discussed the risks associated with untreated OSA Offered to have her set up to discuss her issues with machine with representative, she declined at this time but is willing to give CPAP another try  2. Chronic obstructive pulmonary disease, unspecified COPD type ([place]) PFT normal, symptoms appear controlled at this time on current therapy, continue to monitor  3. Continued use of cigarette smoking Strongly advised to work on smoking cessation--has previously been successful at  cessation in the past for 10 years, discussed options to assist with cessation and she declined Smoking cessation counseling: 1. Pt acknowledges the risks of long term smoking, she will try to quite smoking. 2. Options for different medications including nicotine products, chewing gum, patch etc, Wellbutrin and Chantix is discussed 3. Goal and date of compete cessation is discussed 4. Total time spent in smoking cessation is 15 min.   General Counseling: I have discussed the findings of the evaluation and examination with [place].  I have also discussed any further diagnostic evaluation thatmay be needed or ordered today. Ellicia verbalizes understanding of the findings of todays visit. We also reviewed her medications today and discussed drug interactions and side effects including but not limited excessive drowsiness and altered mental states. We also discussed that there is always a risk not just to her but also people around her. she has been encouraged to call the office with any questions or concerns that should arise related to todays visit.    Time spent: 30  I have personally obtained a history, examined the patient, evaluated laboratory and imaging results, formulated the assessment and plan and placed orders. This patient was seen by Casey Burkitt AGNP-C in Collaboration with Dr. Devona Konig as a part of collaborative care agreement.    Allyne Gee, MD [facility] Pulmonary and Critical Care Sleep medicine

## 2020-09-12 DIAGNOSIS — E876 Hypokalemia: Secondary | ICD-10-CM | POA: Insufficient documentation

## 2020-09-15 DIAGNOSIS — E119 Type 2 diabetes mellitus without complications: Secondary | ICD-10-CM | POA: Diagnosis not present

## 2020-09-15 LAB — HM DIABETES EYE EXAM

## 2020-11-08 ENCOUNTER — Ambulatory Visit (INDEPENDENT_AMBULATORY_CARE_PROVIDER_SITE_OTHER): Payer: Medicare Other | Admitting: Physician Assistant

## 2020-11-08 ENCOUNTER — Other Ambulatory Visit: Payer: Self-pay

## 2020-11-08 ENCOUNTER — Encounter: Payer: Self-pay | Admitting: Physician Assistant

## 2020-11-08 DIAGNOSIS — R609 Edema, unspecified: Secondary | ICD-10-CM | POA: Diagnosis not present

## 2020-11-08 DIAGNOSIS — R300 Dysuria: Secondary | ICD-10-CM

## 2020-11-08 DIAGNOSIS — R5383 Other fatigue: Secondary | ICD-10-CM

## 2020-11-08 DIAGNOSIS — G4733 Obstructive sleep apnea (adult) (pediatric): Secondary | ICD-10-CM | POA: Diagnosis not present

## 2020-11-08 DIAGNOSIS — E119 Type 2 diabetes mellitus without complications: Secondary | ICD-10-CM

## 2020-11-08 DIAGNOSIS — E782 Mixed hyperlipidemia: Secondary | ICD-10-CM

## 2020-11-08 DIAGNOSIS — J449 Chronic obstructive pulmonary disease, unspecified: Secondary | ICD-10-CM | POA: Diagnosis not present

## 2020-11-08 LAB — POCT URINALYSIS DIPSTICK
Bilirubin, UA: NEGATIVE
Glucose, UA: NEGATIVE
Leukocytes, UA: NEGATIVE
Nitrite, UA: NEGATIVE
Protein, UA: POSITIVE — AB
Spec Grav, UA: 1.01 (ref 1.010–1.025)
Urobilinogen, UA: 0.2 E.U./dL
pH, UA: 5 (ref 5.0–8.0)

## 2020-11-08 LAB — POCT GLYCOSYLATED HEMOGLOBIN (HGB A1C): Hemoglobin A1C: 5.9 % — AB (ref 4.0–5.6)

## 2020-11-08 NOTE — Progress Notes (Signed)
[facility] [place],  [address]  Internal MEDICINE  Office Visit Note  Patient Name: Amber Holder  [id number]  [id number]  Date of Service: 11/09/2020  Chief Complaint  Patient presents with  . Diabetes    3 month fup  . Chills    Has them from waist down.  Last few days she has felt pressure in chest and she has been wheezing also    HPI Pt is here for 3 month f/u.  -Gets chills down legs at times. Took home covid test negative. Feels well otherwise. -She does get some wheezing in afternoon. Uses spiriva daily. Flovent sometimes and albuterol at times. -Does have some odd sensation in middle of chest, not pressure or pain, just feels odd. Not constant. Comes and goes and does not radiate anywhere else. She thinks it is anxiety bc she feels it when she watches the news and feels upset. Denies depression. Does not want medication for anxiety. -BG at home if below 120 doesn't take anything. If higher than 120 takes 1/2 tab of atorvastatin. Gets some leg cramps. Educated that atorvastatin is for her cholesterol not her blood sugars and she can take every other day regardless of BG reading. -Does not take daily ABX for UTI anymore, she does cranberry tabs. Urologist suggested she stop macrobid since she had been on it so long. She follows with them again in a few months. She currently has an embedded stone that isnt causing problems anymore, does not want surgery again. -LE edema still a problem-she has not worn stockings recently but will start again. She does not take lasix very often, and instead uses it as needed bc she doesn't like having to use bathroom too often. -She is tired but always gets 8 hours sleep. She was unable to tolerate CPAP in past.  Current Medication: Outpatient Encounter Medications as of 11/08/2020  Medication Sig  . acetaminophen (TYLENOL) 500 MG chewable tablet Chew 1,000 mg by mouth every 8 (eight) hours as needed for pain.  Marland Kitchen  albuterol (VENTOLIN HFA) 108 (90 Base) MCG/ACT inhaler Inhale 2 puffs into the lungs every 6 (six) hours as needed for wheezing or shortness of breath.  Marland Kitchen atorvastatin (LIPITOR) 10 MG tablet Take one tab every other day for high cholesterol  . cholecalciferol (VITAMIN D) 400 UNITS TABS tablet Take 2,000 Units by mouth daily.   Marland Kitchen CRANBERRY PO Take 25,000 mg by mouth daily.  Marland Kitchen dicyclomine (BENTYL) 10 MG capsule   . fluticasone (FLOVENT HFA) 110 MCG/ACT inhaler Inhale 2 puffs into the lungs 2 (two) times daily. Use 2 puffs bid for congestion  . furosemide (LASIX) 20 MG tablet Take 1 tablet (20 mg total) by mouth daily.  Marland Kitchen glucose blood (ONETOUCH VERIO) test strip BLOOD SUGAR TESTING ONCE DAILY . DX E11.65  . hydrocortisone cream 0.5 % Apply topically as needed.   . ibandronate (BONIVA) 150 MG tablet Take 1 tablet (150 mg total) by mouth every 30 (thirty) days.  . Lancets Misc. (ACCU-CHEK FASTCLIX LANCET) KIT To use for daily blood sugar checks  DX - E11.65  . Magnesium 250 MG TABS Take by mouth daily.  . meloxicam (MOBIC) 7.5 MG tablet TAKE 1 TABLET (7.5 MG TOTAL) BY MOUTH 2 (TWO) TIMES A DAY. (Patient taking differently: Take 7.5 mg by mouth as needed.)  . montelukast (SINGULAIR) 10 MG tablet TAKE 1 TABLET BY MOUTH DAILY FOR ALLERGIES  . nitrofurantoin, macrocrystal-monohydrate, (MACROBID) 100 MG capsule Take 1 capsule (100  mg total) by mouth daily.  . pneumococcal 23 valent vaccine (PNEUMOVAX 23) 25 MCG/0.5ML injection Inject 0.21m IM once  . potassium chloride (KLOR-CON) 8 MEQ tablet Take 1 tablet (8 mEq total) by mouth as needed (only take when taking Lasix).  .Marland Kitchentiotropium (SPIRIVA HANDIHALER) 18 MCG inhalation capsule Place 1 capsule (18 mcg total) into inhaler and inhale daily. For copd   No facility-administered encounter medications on file as of 11/08/2020.    Surgical History: Past Surgical History:  Procedure Laterality Date  . APPENDECTOMY    . cataract surgery Bilateral   .  CHOLECYSTECTOMY    . COLONOSCOPY    . COLONOSCOPY WITH PROPOFOL N/A 05/28/2015   Procedure: COLONOSCOPY WITH PROPOFOL;  Surgeon: MLollie Sails MD;  [facility];  Service: Endoscopy;  Laterality: N/A;  . COLONOSCOPY WITH PROPOFOL N/A 10/28/2018   Procedure: COLONOSCOPY WITH PROPOFOL;  Surgeon: SLollie Sails MD;  [facility];  Service: Endoscopy;  Laterality: N/A;  . EYE SURGERY Bilateral 2015   Cataract Extraction with IOL  . LITHOTRIPSY  2015   has had many stones  . Mastoidotomy  1970  . ROTATOR CUFF REPAIR Right 2011  . TOTAL HIP ARTHROPLASTY Right 06/14/2016   Procedure: TOTAL HIP ARTHROPLASTY;  Surgeon: JDereck Leep MD;  Location: [facility] ORS;  Service: Orthopedics;  Laterality: Right;    Medical History: Past Medical History:  Diagnosis Date  . Anxiety 01/02/2012  . Arthritis   . Asthma   . Chronic kidney disease    Kidney stones  . Colon polyps   . COPD [place] disease) [place]   . Diabetes mellitus [place] complication [place]   . GERD (gastroesophageal reflux disease)   . HOH (hard of hearing)    Bilateral hearing aids  . Kidney stone   . Osteoporosis   . Sleep apnea    Does not use C-PAP on a regular basis  . Venous stasis     Family History: Family History  Problem Relation Age of Onset  . Breast cancer Maternal Aunt 518 . Bladder Cancer Neg Hx   . Kidney cancer Neg Hx     Social History   Socioeconomic History  . Marital status: Divorced    Spouse name: Not on file  . Number of children: Not on file  . Years of education: Not on file  . Highest education level: Not on file  Occupational History  . Not on file  Tobacco Use  . Smoking status: Current Every Day Smoker    Packs/day: 1.00    Types: Cigarettes  . Smokeless tobacco: Never Used  Vaping Use  . Vaping Use: Never used  Substance and Sexual Activity  . Alcohol use: Yes    Comment: very rarely   . Drug use: No  . Sexual activity: Not  Currently  Other Topics Concern  . Not on file  Social History Narrative  . Not on file   Social Determinants of Health   Financial Resource Strain: Not on file  Food Insecurity: Not on file  Transportation Needs: Not on file  Physical Activity: Not on file  Stress: Not on file  Social Connections: Not on file  Intimate Partner Violence: Not on file      Review of Systems  Constitutional: Positive for chills and fatigue. Negative for fever and unexpected weight change.  HENT: Negative for congestion, postnasal drip, rhinorrhea, sneezing and sore throat.   Eyes: Negative for redness.  Respiratory: Positive for  apnea. Negative for cough, chest tightness, shortness of breath and wheezing.   Cardiovascular: Negative for chest pain and palpitations.  Gastrointestinal: Negative for abdominal pain, constipation, diarrhea, nausea and vomiting.  Genitourinary: Negative for dysuria and frequency.  Musculoskeletal: Negative for arthralgias, back pain, joint swelling and neck pain.  Skin: Negative for rash.  Neurological: Negative.  Negative for tremors and numbness.  Hematological: Negative for adenopathy. Does not bruise/bleed easily.  Psychiatric/Behavioral: Negative for behavioral problems (Depression), sleep disturbance and suicidal ideas. The patient is nervous/anxious.     Vital Signs: BP 128/70   Pulse 77   Temp 98 F (36.7 C)   Resp 16   Ht 5' 6"  (1.676 m)   Wt 189 lb 12.8 oz (86.1 kg)   SpO2 95%   BMI 30.63 kg/m    Physical Exam Vitals and nursing note reviewed.  Constitutional:      General: She is not in acute distress.    Appearance: She is well-developed. She is obese. She is not diaphoretic.  HENT:     Head: Normocephalic and atraumatic.     Mouth/Throat:     Pharynx: No oropharyngeal exudate.  Eyes:     Pupils: Pupils are equal, round, and reactive to light.  Neck:     Thyroid: No thyromegaly.     Vascular: No JVD.     Trachea: No tracheal deviation.   Cardiovascular:     Rate and Rhythm: Normal rate and regular rhythm.     Heart sounds: Normal heart sounds. No murmur heard. No friction rub. No gallop.   Pulmonary:     Effort: Pulmonary effort is normal. No respiratory distress.     Breath sounds: No wheezing or rales.  Chest:     Chest wall: No tenderness.  Abdominal:     General: Bowel sounds are normal.     Palpations: Abdomen is soft.  Musculoskeletal:        General: Normal range of motion.     Cervical back: Normal range of motion and neck supple.     Right lower leg: Edema present.     Left lower leg: Edema present.  Lymphadenopathy:     Cervical: No cervical adenopathy.  Skin:    General: Skin is warm and dry.  Neurological:     Mental Status: She is alert and oriented to person, place, and time.     Cranial Nerves: No cranial nerve deficit.  Psychiatric:        Behavior: Behavior normal.        Thought Content: Thought content normal.        Judgment: Judgment normal.        Assessment/Plan: 1. Type 2 diabetes mellitus without complication, without long-term current use of insulin (HCC) - POCT glycosylated hemoglobin (Hb A1C) is 5.9. Continue control with diet and exercise.  2. Chronic obstructive pulmonary disease, unspecified COPD type ([place]) Stable. Continue inhalers as prescribed and f/u with pulmonology.  3. Mixed hyperlipidemia Continue Atorvastatin every other day secondary to some leg cramping.  4. Peripheral edema Encouraged to use lasix more regularly to help with LE edema. Pt will also wear compression stockings.  5. OSA (obstructive sleep apnea) Pt unable to tolerate CPAP.  6. Other fatigue Pt due for lab work, lab slip given today.  7. Dysuria - POCT Urinalysis Dipstick - CULTURE, URINE COMPREHENSIVE   General Counseling: Deolinda verbalizes understanding of the findings of todays visit and agrees with plan of treatment. I have discussed any further  diagnostic evaluation that may be  needed or ordered today. We also reviewed her medications today. she has been encouraged to call the office with any questions or concerns that should arise related to todays visit.    Orders Placed This Encounter  Procedures  . CULTURE, URINE COMPREHENSIVE  . POCT Urinalysis Dipstick  . POCT glycosylated hemoglobin (Hb A1C)    No orders of the defined types were placed in this encounter.   This patient was seen by Drema Dallas, PA-C in collaboration with Dr. Clayborn Bigness as a part of collaborative care agreement.   Total time spent:40 Minutes Time spent includes review of chart, medications, test results, and follow up plan with the patient.      Dr Lavera Guise Internal medicine

## 2020-11-11 LAB — CULTURE, URINE COMPREHENSIVE

## 2020-11-16 ENCOUNTER — Other Ambulatory Visit: Payer: Self-pay

## 2020-11-16 MED ORDER — ONETOUCH DELICA PLUS LANCET30G MISC: 3 refills | Status: AC

## 2020-11-29 ENCOUNTER — Encounter: Payer: Self-pay | Admitting: Internal Medicine

## 2020-12-16 DIAGNOSIS — S60222A Contusion of left hand, initial encounter: Secondary | ICD-10-CM | POA: Diagnosis not present

## 2020-12-16 DIAGNOSIS — M19042 Primary osteoarthritis, left hand: Secondary | ICD-10-CM | POA: Diagnosis not present

## 2020-12-16 DIAGNOSIS — Z23 Encounter for immunization: Secondary | ICD-10-CM | POA: Diagnosis not present

## 2020-12-16 DIAGNOSIS — S6992XA Unspecified injury of left wrist, hand and finger(s), initial encounter: Secondary | ICD-10-CM | POA: Diagnosis not present

## 2020-12-24 ENCOUNTER — Other Ambulatory Visit: Payer: Self-pay | Admitting: Physician Assistant

## 2020-12-24 DIAGNOSIS — R5383 Other fatigue: Secondary | ICD-10-CM | POA: Diagnosis not present

## 2020-12-24 DIAGNOSIS — E038 Other specified hypothyroidism: Secondary | ICD-10-CM | POA: Diagnosis not present

## 2020-12-24 DIAGNOSIS — R6889 Other general symptoms and signs: Secondary | ICD-10-CM | POA: Diagnosis not present

## 2020-12-24 DIAGNOSIS — E538 Deficiency of other specified B group vitamins: Secondary | ICD-10-CM | POA: Diagnosis not present

## 2020-12-24 DIAGNOSIS — E559 Vitamin D deficiency, unspecified: Secondary | ICD-10-CM | POA: Diagnosis not present

## 2020-12-24 DIAGNOSIS — E782 Mixed hyperlipidemia: Secondary | ICD-10-CM | POA: Diagnosis not present

## 2020-12-24 DIAGNOSIS — E611 Iron deficiency: Secondary | ICD-10-CM | POA: Diagnosis not present

## 2020-12-25 LAB — COMPREHENSIVE METABOLIC PANEL
ALT: 18 IU/L (ref 0–32)
AST: 13 IU/L (ref 0–40)
Albumin/Globulin Ratio: 2.1 (ref 1.2–2.2)
Albumin: 4.7 g/dL (ref 3.7–4.7)
Alkaline Phosphatase: 76 IU/L (ref 44–121)
BUN/Creatinine Ratio: 25 (ref 12–28)
BUN: 15 mg/dL (ref 8–27)
Bilirubin Total: 0.3 mg/dL (ref 0.0–1.2)
CO2: 21 mmol/L (ref 20–29)
Calcium: 9.8 mg/dL (ref 8.7–10.3)
Chloride: 102 mmol/L (ref 96–106)
Creatinine, Ser: 0.61 mg/dL (ref 0.57–1.00)
Globulin, Total: 2.2 g/dL (ref 1.5–4.5)
Glucose: 97 mg/dL (ref 65–99)
Potassium: 5.1 mmol/L (ref 3.5–5.2)
Sodium: 139 mmol/L (ref 134–144)
Total Protein: 6.9 g/dL (ref 6.0–8.5)
eGFR: 93 mL/min/{1.73_m2} (ref >59)

## 2020-12-25 LAB — CBC WITH DIFFERENTIAL/PLATELET
Basophils Absolute: 0.1 10*3/uL (ref 0.0–0.2)
Basos: 1 %
EOS (ABSOLUTE): 0.1 10*3/uL (ref 0.0–0.4)
Eos: 1 %
Hematocrit: 46.9 % — ABNORMAL HIGH (ref 34.0–46.6)
Hemoglobin: 16.1 g/dL — ABNORMAL HIGH (ref 11.1–15.9)
Immature Grans (Abs): 0 10*3/uL (ref 0.0–0.1)
Immature Granulocytes: 1 %
Lymphocytes Absolute: 3.8 10*3/uL — ABNORMAL HIGH (ref 0.7–3.1)
Lymphs: 45 %
MCH: 30.4 pg (ref 26.6–33.0)
MCHC: 34.3 g/dL (ref 31.5–35.7)
MCV: 89 fL (ref 79–97)
Monocytes Absolute: 0.5 10*3/uL (ref 0.1–0.9)
Monocytes: 6 %
Neutrophils Absolute: 4 10*3/uL (ref 1.4–7.0)
Neutrophils: 46 %
Platelets: 238 10*3/uL (ref 150–450)
RBC: 5.29 x10E6/uL — ABNORMAL HIGH (ref 3.77–5.28)
RDW: 13.1 % (ref 11.7–15.4)
WBC: 8.5 10*3/uL (ref 3.4–10.8)

## 2020-12-25 LAB — LIPID PANEL W/O CHOL/HDL RATIO
Cholesterol, Total: 239 mg/dL — ABNORMAL HIGH (ref 100–199)
HDL: 55 mg/dL (ref >39)
LDL Chol Calc (NIH): 168 mg/dL — ABNORMAL HIGH (ref 0–99)
Triglycerides: 92 mg/dL (ref 0–149)
VLDL Cholesterol Cal: 16 mg/dL (ref 5–40)

## 2020-12-25 LAB — FERRITIN: Ferritin: 87 ng/mL (ref 15–150)

## 2020-12-25 LAB — IRON AND TIBC
Iron Saturation: 23 % (ref 15–55)
Iron: 76 ug/dL (ref 27–139)
Total Iron Binding Capacity: 333 ug/dL (ref 250–450)
UIBC: 257 ug/dL (ref 118–369)

## 2020-12-25 LAB — T4, FREE: Free T4: 1.23 ng/dL (ref 0.82–1.77)

## 2020-12-25 LAB — VITAMIN D 25 HYDROXY (VIT D DEFICIENCY, FRACTURES): Vit D, 25-Hydroxy: 18.7 ng/mL — ABNORMAL LOW (ref 30.0–100.0)

## 2020-12-25 LAB — B12 AND FOLATE PANEL
Folate: 16.9 ng/mL (ref >3.0)
Vitamin B-12: 376 pg/mL (ref 232–1245)

## 2020-12-25 LAB — TSH: TSH: 1.77 u[IU]/mL (ref 0.450–4.500)

## 2021-01-09 ENCOUNTER — Encounter: Payer: Self-pay | Admitting: Internal Medicine

## 2021-01-11 ENCOUNTER — Other Ambulatory Visit: Payer: Self-pay | Admitting: Physician Assistant

## 2021-01-11 DIAGNOSIS — E559 Vitamin D deficiency, unspecified: Secondary | ICD-10-CM

## 2021-01-11 MED ORDER — ERGOCALCIFEROL 1.25 MG (50000 UT) PO CAPS: ORAL_CAPSULE | ORAL | 3 refills | Status: DC

## 2021-02-10 ENCOUNTER — Encounter: Payer: Self-pay | Admitting: Physician Assistant

## 2021-02-10 ENCOUNTER — Ambulatory Visit (INDEPENDENT_AMBULATORY_CARE_PROVIDER_SITE_OTHER): Payer: Medicare Other | Admitting: Physician Assistant

## 2021-02-10 ENCOUNTER — Other Ambulatory Visit: Payer: Self-pay

## 2021-02-10 DIAGNOSIS — E119 Type 2 diabetes mellitus without complications: Secondary | ICD-10-CM | POA: Diagnosis not present

## 2021-02-10 DIAGNOSIS — Z23 Encounter for immunization: Secondary | ICD-10-CM | POA: Diagnosis not present

## 2021-02-10 DIAGNOSIS — E559 Vitamin D deficiency, unspecified: Secondary | ICD-10-CM | POA: Diagnosis not present

## 2021-02-10 DIAGNOSIS — R609 Edema, unspecified: Secondary | ICD-10-CM

## 2021-02-10 DIAGNOSIS — E782 Mixed hyperlipidemia: Secondary | ICD-10-CM

## 2021-02-10 DIAGNOSIS — G4733 Obstructive sleep apnea (adult) (pediatric): Secondary | ICD-10-CM | POA: Diagnosis not present

## 2021-02-10 DIAGNOSIS — R600 Localized edema: Secondary | ICD-10-CM

## 2021-02-10 DIAGNOSIS — J449 Chronic obstructive pulmonary disease, unspecified: Secondary | ICD-10-CM | POA: Diagnosis not present

## 2021-02-10 DIAGNOSIS — R300 Dysuria: Secondary | ICD-10-CM | POA: Diagnosis not present

## 2021-02-10 LAB — POCT URINALYSIS DIPSTICK
Bilirubin, UA: NEGATIVE
Blood, UA: NEGATIVE
Glucose, UA: NEGATIVE
Leukocytes, UA: NEGATIVE
Nitrite, UA: NEGATIVE
Protein, UA: NEGATIVE
Spec Grav, UA: 1.02 (ref 1.010–1.025)
Urobilinogen, UA: 0.2 E.U./dL
pH, UA: 5 (ref 5.0–8.0)

## 2021-02-10 LAB — POCT GLYCOSYLATED HEMOGLOBIN (HGB A1C): Hemoglobin A1C: 5.9 % — AB (ref 4.0–5.6)

## 2021-02-10 MED ORDER — ZOSTER VAC RECOMB ADJUVANTED 50 MCG/0.5ML IM SUSR: 0.5000 mL | Freq: Once | INTRAMUSCULAR | 0 refills | Status: AC

## 2021-02-10 MED ORDER — FUROSEMIDE 20 MG PO TABS: 20.0000 mg | ORAL_TABLET | Freq: Every day | ORAL | 1 refills | Status: DC

## 2021-02-10 NOTE — Progress Notes (Signed)
[facility] [place], [place] [address]  Internal MEDICINE  Office Visit Note  Patient Name: Amber Holder  [id number]  [id number]  Date of Service: 02/13/2021  Chief Complaint  Patient presents with   Follow-up   Diabetes   Gastroesophageal Reflux   Sleep Apnea   COPD   Asthma   Anxiety   Urinary Tract Infection    HPI -Pt is here for routine follow up to review labwork -intermittent pain. Has been doing a lot of house and garden work and is unsure if back pain is due to working a lot of UTI potentially. Has a hx of kidney stone as well. Was on ABX for awhile and was tapered off to continue cranberry instead. Denies any burning with urination. She has a hx of UTI since she was 75yo and has gotten used to them. Sometimes has little urine. -Legs very swollen, reports her stockings hurt her toes. Considering ordering a different size. Tried the lasix for 2 weeks but did not see a big difference so stopped it and has not taken recently. -Dx with OSA, but CPAP is sitting in her closet. States she promised she would try it again, but states she cant sleep with it. Tried it for 2 weeks. Refused to have anything through her nose, because narrow passage and ENT has wanted to operate in past, but she does not want to do this.  -Reports she cannot even tolerate oxygen by [place] and is unwilling to do ONO because she would not wear oxygen. -Discussed lab work further, pt taking vitamin D supplement. Elevated hemoglobin--likely due to smoking and untreated OSA  Current Medication: Outpatient Encounter Medications as of 02/10/2021  Medication Sig   acetaminophen (TYLENOL) 500 MG chewable tablet Chew 1,000 mg by mouth every 8 (eight) hours as needed for pain.   albuterol (VENTOLIN HFA) 108 (90 Base) MCG/ACT inhaler Inhale 2 puffs into the lungs every 6 (six) hours as needed for wheezing or shortness of breath.   atorvastatin (LIPITOR) 10 MG tablet Take one tab every other day  for high cholesterol   cholecalciferol (VITAMIN D) 400 UNITS TABS tablet Take 2,000 Units by mouth daily.    CRANBERRY PO Take 25,000 mg by mouth daily.   dicyclomine (BENTYL) 10 MG capsule    ergocalciferol (DRISDOL) 1.25 MG (50000 UT) capsule Take one cap q week   fluticasone (FLOVENT HFA) 110 MCG/ACT inhaler Inhale 2 puffs into the lungs 2 (two) times daily. Use 2 puffs bid for congestion   glucose blood (ONETOUCH VERIO) test strip BLOOD SUGAR TESTING ONCE DAILY . DX E11.65   hydrocortisone cream 0.5 % Apply topically as needed.    ibandronate (BONIVA) 150 MG tablet Take 1 tablet (150 mg total) by mouth every 30 (thirty) days.   Lancets (ONETOUCH DELICA PLUS NATFTD32K) MISC Use  as directed twice a daily DX E11.65   Magnesium 250 MG TABS Take by mouth daily.   meloxicam (MOBIC) 7.5 MG tablet TAKE 1 TABLET (7.5 MG TOTAL) BY MOUTH 2 (TWO) TIMES A DAY. (Patient taking differently: Take 7.5 mg by mouth as needed.)   montelukast (SINGULAIR) 10 MG tablet TAKE 1 TABLET BY MOUTH DAILY FOR ALLERGIES   potassium chloride (KLOR-CON) 8 MEQ tablet Take 1 tablet (8 mEq total) by mouth as needed (only take when taking Lasix).   tiotropium (SPIRIVA HANDIHALER) 18 MCG inhalation capsule Place 1 capsule (18 mcg total) into inhaler and inhale daily. For copd   [DISCONTINUED] furosemide (LASIX)  20 MG tablet Take 1 tablet (20 mg total) by mouth daily.   [DISCONTINUED] pneumococcal 23 valent vaccine (PNEUMOVAX 23) 25 MCG/0.5ML injection Inject 0.65ml IM once   [DISCONTINUED] Zoster Vaccine Adjuvanted [facility]) injection Inject 0.5 mLs into the muscle once.   furosemide (LASIX) 20 MG tablet Take 1 tablet (20 mg total) by mouth daily.   [EXPIRED] Zoster Vaccine Adjuvanted [facility]) injection Inject 0.5 mLs into the muscle once for 1 dose.   [DISCONTINUED] nitrofurantoin, macrocrystal-monohydrate, (MACROBID) 100 MG capsule Take 1 capsule (100 mg total) by mouth daily.   No facility-administered encounter  medications on file as of 02/10/2021.    Surgical History: Past Surgical History:  Procedure Laterality Date   APPENDECTOMY     cataract surgery Bilateral    CHOLECYSTECTOMY     COLONOSCOPY     COLONOSCOPY WITH PROPOFOL N/A 05/28/2015   Procedure: COLONOSCOPY WITH PROPOFOL;  Surgeon: Lollie Sails, MD;  Location: [facility]COPY;  Service: Endoscopy;  Laterality: N/A;   COLONOSCOPY WITH PROPOFOL N/A 10/28/2018   Procedure: COLONOSCOPY WITH PROPOFOL;  Surgeon: Lollie Sails, MD;  [facility];  Service: Endoscopy;  Laterality: N/A;   EYE SURGERY Bilateral 2015   Cataract Extraction with IOL   LITHOTRIPSY  2015   has had many stones   Mastoidotomy  1970   ROTATOR CUFF REPAIR Right 2011   TOTAL HIP ARTHROPLASTY Right 06/14/2016   Procedure: TOTAL HIP ARTHROPLASTY;  Surgeon: Dereck Leep, MD;  Location: [facility] ORS;  Service: Orthopedics;  Laterality: Right;    Medical History: Past Medical History:  Diagnosis Date   Anxiety 01/02/2012   Arthritis    Asthma    Chronic kidney disease    Kidney stones   Colon polyps    COPD (chronic obstructive pulmonary disease) (HCC)    Diabetes mellitus without complication (HCC)    GERD (gastroesophageal reflux disease)    HOH (hard of hearing)    Bilateral hearing aids   Kidney stone    Osteoporosis    Sleep apnea    Does not use C-PAP on a regular basis   Venous stasis     Family History: Family History  Problem Relation Age of Onset   Breast cancer Maternal Aunt 11   Bladder Cancer Neg Hx    Kidney cancer Neg Hx     Social History   Socioeconomic History   Marital status: Divorced    Spouse name: Not on file   Number of children: Not on file   Years of education: Not on file   Highest education level: Not on file  Occupational History   Not on file  Tobacco Use   Smoking status: Every Day    Packs/day: 1.00    Pack years: 0.00    Types: Cigarettes   Smokeless tobacco: Never  Vaping Use   Vaping Use:  Never used  Substance and Sexual Activity   Alcohol use: Yes    Comment: very rarely    Drug use: No   Sexual activity: Not Currently  Other Topics Concern   Not on file  Social History Narrative   Not on file   Social Determinants of Health   Financial Resource Strain: Not on file  Food Insecurity: Not on file  Transportation Needs: Not on file  Physical Activity: Not on file  Stress: Not on file  Social Connections: Not on file  Intimate Partner Violence: Not on file      Review of Systems  Constitutional:  Positive for fatigue. Negative for chills and unexpected weight change.  HENT:  Negative for congestion, postnasal drip, rhinorrhea, sneezing and sore throat.   Eyes:  Negative for redness.  Respiratory:  Positive for apnea and shortness of breath. Negative for cough and chest tightness.   Cardiovascular:  Positive for leg swelling. Negative for chest pain and palpitations.  Gastrointestinal:  Negative for abdominal pain, constipation, diarrhea, nausea and vomiting.  Genitourinary:  Negative for dysuria and frequency.  Musculoskeletal:  Positive for arthralgias and back pain. Negative for joint swelling and neck pain.  Skin:  Negative for rash.  Neurological: Negative.  Negative for tremors and numbness.  Hematological:  Negative for adenopathy. Does not bruise/bleed easily.  Psychiatric/Behavioral:  Negative for behavioral problems (Depression), sleep disturbance and suicidal ideas. The patient is not nervous/anxious.    Vital Signs: BP 134/61   Pulse 76   Temp 97.8 F (36.6 C)   Resp 16   Ht 5\' 6"  (1.676 m)   Wt 186 lb (84.4 kg)   SpO2 95%   BMI 30.02 kg/m    Physical Exam Vitals and nursing note reviewed.  Constitutional:      General: She is not in acute distress.    Appearance: She is well-developed. She is obese. She is not diaphoretic.  HENT:     Head: Normocephalic and atraumatic.     Mouth/Throat:     Pharynx: No oropharyngeal exudate.  Eyes:      Pupils: Pupils are equal, round, and reactive to light.  Neck:     Thyroid: No thyromegaly.     Vascular: No JVD.     Trachea: No tracheal deviation.  Cardiovascular:     Rate and Rhythm: Normal rate and regular rhythm.     Heart sounds: Normal heart sounds. No murmur heard.   No friction rub. No gallop.  Pulmonary:     Effort: Pulmonary effort is normal. No respiratory distress.     Breath sounds: No wheezing or rales.  Chest:     Chest wall: No tenderness.  Abdominal:     General: Bowel sounds are normal.     Palpations: Abdomen is soft.  Musculoskeletal:        General: Normal range of motion.     Cervical back: Normal range of motion and neck supple.     Right lower leg: Edema present.     Left lower leg: Edema present.  Lymphadenopathy:     Cervical: No cervical adenopathy.  Skin:    General: Skin is warm and dry.  Neurological:     Mental Status: She is alert and oriented to person, place, and time.     Cranial Nerves: No cranial nerve deficit.  Psychiatric:        Behavior: Behavior normal.        Thought Content: Thought content normal.        Judgment: Judgment normal.       Assessment/Plan: 1. Type 2 diabetes mellitus without complication, without long-term current use of insulin (HCC) - POCT HgB A1C is 5.9. Continue control with diet and exercise  2. Peripheral edema Educated to restart lasix as needed. Pt will take 40mg  for 3 days then taper to 20mg  for 1-2 days then use as needed. Also educated to try new compression stockings and elevate legs. Echo done 1 yr ago showing diastolic dysfunction. Continue to monitor - furosemide (LASIX) 20 MG tablet; Take 1 tablet (20 mg total) by mouth daily.  Dispense: 90 tablet;  Refill: 1  3. Mixed hyperlipidemia Continue atorvastatin  4. OSA (obstructive sleep apnea) Intolerant to CPAP and unwilling to retry despite explaining the consequences  5. Chronic obstructive pulmonary disease, unspecified COPD type  ([place]) Continue inhalers as indicated  6. Vitamin D deficiency Continue drisdol weekly  7. Need for shingles vaccine - Zoster Vaccine Adjuvanted [facility]) injection; Inject 0.5 mLs into the muscle once for 1 dose.  Dispense: 0.5 mL; Refill: 0  8. Dysuria - POCT Urinalysis Dipstick   General Counseling: Dametra verbalizes understanding of the findings of todays visit and agrees with plan of treatment. I have discussed any further diagnostic evaluation that may be needed or ordered today. We also reviewed her medications today. she has been encouraged to call the office with any questions or concerns that should arise related to todays visit.    Orders Placed This Encounter  Procedures   POCT HgB A1C   POCT Urinalysis Dipstick    Meds ordered this encounter  Medications   Zoster Vaccine Adjuvanted [facility]) injection    Sig: Inject 0.5 mLs into the muscle once for 1 dose.    Dispense:  0.5 mL    Refill:  0   furosemide (LASIX) 20 MG tablet    Sig: Take 1 tablet (20 mg total) by mouth daily.    Dispense:  90 tablet    Refill:  1    This patient was seen by Drema Dallas, PA-C in collaboration with Dr. Clayborn Bigness as a part of collaborative care agreement.   Total time spent:40 Minutes Time spent includes review of chart, medications, test results, and follow up plan with the patient.      Dr Lavera Guise Internal medicine

## 2021-02-22 ENCOUNTER — Telehealth: Payer: Self-pay

## 2021-02-22 NOTE — Telephone Encounter (Signed)
LMOM to disregard bill from [employer] because I have resubmitted it using the code E55.9 for Vitamin D, 25 Hydroxy. The process can take around 30-45 days

## 2021-03-03 DIAGNOSIS — K580 Irritable bowel syndrome with diarrhea: Secondary | ICD-10-CM | POA: Diagnosis not present

## 2021-03-03 DIAGNOSIS — L551 Sunburn of second degree: Secondary | ICD-10-CM | POA: Diagnosis not present

## 2021-03-03 DIAGNOSIS — L559 Sunburn, unspecified: Secondary | ICD-10-CM | POA: Diagnosis not present

## 2021-03-03 DIAGNOSIS — I872 Venous insufficiency (chronic) (peripheral): Secondary | ICD-10-CM | POA: Diagnosis not present

## 2021-03-07 ENCOUNTER — Other Ambulatory Visit: Payer: Self-pay

## 2021-03-07 ENCOUNTER — Encounter: Payer: Self-pay | Admitting: Nurse Practitioner

## 2021-03-07 ENCOUNTER — Ambulatory Visit (INDEPENDENT_AMBULATORY_CARE_PROVIDER_SITE_OTHER): Payer: Medicare Other | Admitting: Nurse Practitioner

## 2021-03-07 VITALS — BP 123/72 | HR 71 | Temp 98.2°F | Resp 16 | Ht 66.0 in | Wt 185.2 lb

## 2021-03-07 DIAGNOSIS — R0602 Shortness of breath: Secondary | ICD-10-CM

## 2021-03-07 DIAGNOSIS — G4733 Obstructive sleep apnea (adult) (pediatric): Secondary | ICD-10-CM | POA: Diagnosis not present

## 2021-03-07 DIAGNOSIS — J449 Chronic obstructive pulmonary disease, unspecified: Secondary | ICD-10-CM

## 2021-03-07 DIAGNOSIS — F17210 Nicotine dependence, cigarettes, uncomplicated: Secondary | ICD-10-CM | POA: Diagnosis not present

## 2021-03-07 NOTE — Progress Notes (Signed)
[facility] [place], [place] [address]  Internal MEDICINE  Office Visit Note  Patient Name: Amber Holder  [id number]  [id number]  Date of Service: 03/07/2021  Chief Complaint  Patient presents with   Follow-up    Patient has a sunburn that sent her to the urgent care, patient also has a balance issue, patient also needs med refills,     Asthma   COPD   Diabetes    HPI Amber Holder presents for a pulmonary follow up visit for COPD. Amber Holder done in office today. Comparing today's spiro results to her last spiro in February 2021, results show mild airway obstruction with a decrease in FEV1, FVC and FEV1/FVC ratio. Her PFT in December 2021 was normal except for a severely decreased DLCO.  -continues to smoke and has been smoking more recently due to increased stressors. She was reluctant to admit how much she has been smoking but a previous office visit note from earlier this year mentioned 2-3 ppd.  -requesting medication refill. -has OSA, is not using CPAP and declined again when discussed.  -intermittent shortness of breath, recovers quickly, using maintenance inhaler- spiriva and has a rescue inhaler as needed.     Current Medication: Outpatient Encounter Medications as of 03/07/2021  Medication Sig   acetaminophen (TYLENOL) 500 MG chewable tablet Chew 1,000 mg by mouth every 8 (eight) hours as needed for pain.   albuterol (VENTOLIN HFA) 108 (90 Base) MCG/ACT inhaler Inhale 2 puffs into the lungs every 6 (six) hours as needed for wheezing or shortness of breath.   atorvastatin (LIPITOR) 10 MG tablet Take one tab every other day for high cholesterol   cholecalciferol (VITAMIN D) 400 UNITS TABS tablet Take 2,000 Units by mouth daily.    CRANBERRY PO Take 25,000 mg by mouth daily.   dicyclomine (BENTYL) 10 MG capsule    [EXPIRED] doxycycline (VIBRAMYCIN) 100 MG capsule Take 1 capsule by mouth 2 (two) times daily.   ergocalciferol (DRISDOL) 1.25 MG (50000 UT) capsule  Take one cap q week   fluticasone (FLOVENT HFA) 110 MCG/ACT inhaler Inhale 2 puffs into the lungs 2 (two) times daily. Use 2 puffs bid for congestion   furosemide (LASIX) 20 MG tablet Take 1 tablet (20 mg total) by mouth daily.   glucose blood (ONETOUCH VERIO) test strip BLOOD SUGAR TESTING ONCE DAILY . DX E11.65   hydrocortisone cream 0.5 % Apply topically as needed.    ibandronate (BONIVA) 150 MG tablet Take 1 tablet (150 mg total) by mouth every 30 (thirty) days.   Lancets (ONETOUCH DELICA PLUS EYCXKG81E) MISC Use  as directed twice a daily DX E11.65   Magnesium 250 MG TABS Take by mouth daily.   montelukast (SINGULAIR) 10 MG tablet TAKE 1 TABLET BY MOUTH DAILY FOR ALLERGIES   potassium chloride (KLOR-CON) 8 MEQ tablet Take 1 tablet (8 mEq total) by mouth as needed (only take when taking Lasix).   tiotropium (SPIRIVA HANDIHALER) 18 MCG inhalation capsule Place 1 capsule (18 mcg total) into inhaler and inhale daily. For copd   triamcinolone cream (KENALOG) 0.1 % Apply topically 2 (two) times daily.   meloxicam (MOBIC) 7.5 MG tablet TAKE 1 TABLET (7.5 MG TOTAL) BY MOUTH 2 (TWO) TIMES A DAY. (Patient not taking: Reported on 03/07/2021)   No facility-administered encounter medications on file as of 03/07/2021.    Surgical History: Past Surgical History:  Procedure Laterality Date   APPENDECTOMY     cataract surgery Bilateral    CHOLECYSTECTOMY  COLONOSCOPY     COLONOSCOPY WITH PROPOFOL N/A 05/28/2015   Procedure: COLONOSCOPY WITH PROPOFOL;  Surgeon: Lollie Sails, MD;  Location[facility] ENDOSCOPY;  Service: Endoscopy;  Laterality: N/A;   COLONOSCOPY WITH PROPOFOL N/A 10/28/2018   Procedure: COLONOSCOPY WITH PROPOFOL;  Surgeon: Lollie Sails, MD;  Location: [facility] ENDOSCOPY;  Service: Endoscopy;  Laterality: N/A;   EYE SURGERY Bilateral 2015   Cataract Extraction with IOL   LITHOTRIPSY  2015   has had many stones   Mastoidotomy  1970   ROTATOR CUFF REPAIR Right 2011   TOTAL HIP  ARTHROPLASTY Right 06/14/2016   Procedure: TOTAL HIP ARTHROPLASTY;  Surgeon: Dereck Leep, MD;  Location: [facility] ORS;  Service: Orthopedics;  Laterality: Right;    Medical History: Past Medical History:  Diagnosis Date   Anxiety 01/02/2012   Arthritis    Asthma    Chronic kidney disease    Kidney stones   Colon polyps    COPD (chronic obstructive pulmonary disease) (HCC)    Diabetes mellitus without complication (HCC)    GERD (gastroesophageal reflux disease)    HOH (hard of hearing)    Bilateral hearing aids   Kidney stone    Osteoporosis    Sleep apnea    Does not use C-PAP on a regular basis   Venous stasis     Family History: Family History  Problem Relation Age of Onset   Breast cancer Maternal Aunt 1   Bladder Cancer Neg Hx    Kidney cancer Neg Hx     Social History   Socioeconomic History   Marital status: Divorced    Spouse name: Not on file   Number of children: Not on file   Years of education: Not on file   Highest education level: Not on file  Occupational History   Not on file  Tobacco Use   Smoking status: Every Day    Packs/day: 1.00    Types: Cigarettes   Smokeless tobacco: Never  Vaping Use   Vaping Use: Never used  Substance and Sexual Activity   Alcohol use: Yes    Comment: very rarely    Drug use: No   Sexual activity: Not Currently  Other Topics Concern   Not on file  Social History Narrative   Not on file   Social Determinants of Health   Financial Resource Strain: Not on file  Food Insecurity: Not on file  Transportation Needs: Not on file  Physical Activity: Not on file  Stress: Not on file  Social Connections: Not on file  Intimate Partner Violence: Not on file      Review of Systems  Constitutional:  Negative for appetite change, chills, fatigue and fever.  HENT:  Negative for congestion, postnasal drip, rhinorrhea, sneezing and sore throat.   Eyes: Negative.   Respiratory:  Positive for cough, shortness of breath  (intermittent) and wheezing (intermittent). Negative for chest tightness.   Cardiovascular: Negative.  Negative for chest pain and palpitations.  Gastrointestinal:  Positive for anal bleeding. Negative for abdominal pain, constipation, diarrhea, nausea and vomiting.  Musculoskeletal: Negative.   Skin: Negative.   Neurological:  Negative for dizziness, light-headedness and headaches.  Psychiatric/Behavioral:  The patient is not nervous/anxious.    Vital Signs: BP 123/72   Pulse 71   Temp 98.2 F (36.8 C)   Resp 16   Ht 5\' 6"  (1.676 m)   Wt 185 lb 3.2 oz (84 kg)   SpO2 98%   BMI 29.89 kg/m  Physical Exam Vitals reviewed.  Constitutional:      General: She is not in acute distress.    Appearance: Normal appearance. She is well-developed and normal weight. She is not ill-appearing or diaphoretic.  HENT:     Head: Normocephalic and atraumatic.  Neck:     Thyroid: No thyromegaly.     Vascular: No JVD.     Trachea: No tracheal deviation.  Cardiovascular:     Rate and Rhythm: Normal rate and regular rhythm.     Pulses: Normal pulses.     Heart sounds: Normal heart sounds. No murmur heard.   No friction rub. No gallop.  Pulmonary:     Effort: Pulmonary effort is normal. No respiratory distress.     Breath sounds: Normal breath sounds. No stridor. No wheezing or rales.  Chest:     Chest wall: No tenderness.  Musculoskeletal:     Cervical back: Normal range of motion and neck supple.  Lymphadenopathy:     Cervical: No cervical adenopathy.  Skin:    General: Skin is warm and dry.     Capillary Refill: Capillary refill takes less than 2 seconds.     Coloration: Skin is not pale.     Findings: No erythema or rash.  Neurological:     Mental Status: She is alert and oriented to person, place, and time.     Motor: No abnormal muscle tone.     Deep Tendon Reflexes: Reflexes are normal and symmetric.  Psychiatric:        Mood and Affect: Mood normal.        Behavior: Behavior  normal.    Assessment/Plan: 1. Chronic obstructive pulmonary disease, unspecified COPD type ([place]) Using spiriva and flovent inhalers. Taking montelukast daily. Discussed switching to trelegy ellipta inhaler for further improvement in breathing. Patient would like to hold off for now and discuss the topic again in 6 months. This medication and side effects were discussed at length but patient would like to do further research prior to changing her medication.   2. Continuous dependence on cigarette smoking She continues to smoke 2-3 ppd, no interest in quitting right now.  Smoking cessation counseling: Pt acknowledges the risks of long term smoking, She is not currently interested in quitting smoking.  Options for different medications including nicotine products, chewing gum, patch etc, Wellbutrin and Chantix is discussed, patient declines all options.  Total time spent in smoking cessation is 15 min.   3. OSA (obstructive sleep apnea) Not currently using CPAP, has declined in the past and is still not interested.   4. SOB (shortness of breath) Spirometry results discussed at length, repeat spiro in 6 months.  - Spirometry with graph   General Counseling: Amber Holder verbalizes understanding of the findings of todays visit and agrees with plan of treatment. I have discussed any further diagnostic evaluation that may be needed or ordered today. We also reviewed her medications today. she has been encouraged to call the office with any questions or concerns that should arise related to todays visit.    Orders Placed This Encounter  Procedures   Spirometry with graph    No orders of the defined types were placed in this encounter.   Return in about 6 months (around 09/07/2021) for F/U, pulmonary only, Audrey Thull or DSK.   Total time spent:30 Minutes Time spent includes review of chart, medications, test results, and follow up plan with the patient.   [place] Controlled Substance Database was  reviewed by me.  This patient was seen by Jonetta Osgood, FNP-C in collaboration with Dr. Clayborn Bigness as a part of collaborative care agreement.   Octavion Mollenkopf R. Valetta Fuller, MSN, FNP-C Internal medicine

## 2021-03-15 DIAGNOSIS — L578 Other skin changes due to chronic exposure to nonionizing radiation: Secondary | ICD-10-CM | POA: Diagnosis not present

## 2021-03-15 DIAGNOSIS — Z872 Personal history of diseases of the skin and subcutaneous tissue: Secondary | ICD-10-CM | POA: Diagnosis not present

## 2021-03-15 DIAGNOSIS — D485 Neoplasm of uncertain behavior of skin: Secondary | ICD-10-CM | POA: Diagnosis not present

## 2021-03-15 DIAGNOSIS — D225 Melanocytic nevi of trunk: Secondary | ICD-10-CM | POA: Diagnosis not present

## 2021-03-15 DIAGNOSIS — Z86018 Personal history of other benign neoplasm: Secondary | ICD-10-CM | POA: Diagnosis not present

## 2021-04-07 ENCOUNTER — Encounter: Payer: Self-pay | Admitting: Internal Medicine

## 2021-04-21 ENCOUNTER — Ambulatory Visit: Payer: Medicare Other | Admitting: Urology

## 2021-04-21 DIAGNOSIS — H6533 Chronic mucoid otitis media, bilateral: Secondary | ICD-10-CM | POA: Diagnosis not present

## 2021-04-21 DIAGNOSIS — H903 Sensorineural hearing loss, bilateral: Secondary | ICD-10-CM | POA: Diagnosis not present

## 2021-04-21 DIAGNOSIS — H7201 Central perforation of tympanic membrane, right ear: Secondary | ICD-10-CM | POA: Diagnosis not present

## 2021-05-12 ENCOUNTER — Telehealth: Payer: Self-pay

## 2021-05-12 ENCOUNTER — Ambulatory Visit: Payer: Medicare Other | Admitting: Physician Assistant

## 2021-05-12 NOTE — Telephone Encounter (Signed)
Left vm to confirm 05/13/21 appointment-Toni

## 2021-05-13 ENCOUNTER — Ambulatory Visit (INDEPENDENT_AMBULATORY_CARE_PROVIDER_SITE_OTHER): Payer: Medicare Other | Admitting: Physician Assistant

## 2021-05-13 ENCOUNTER — Other Ambulatory Visit: Payer: Self-pay

## 2021-05-13 ENCOUNTER — Encounter: Payer: Self-pay | Admitting: Physician Assistant

## 2021-05-13 DIAGNOSIS — Z01419 Encounter for gynecological examination (general) (routine) without abnormal findings: Secondary | ICD-10-CM

## 2021-05-13 DIAGNOSIS — E119 Type 2 diabetes mellitus without complications: Secondary | ICD-10-CM

## 2021-05-13 DIAGNOSIS — E782 Mixed hyperlipidemia: Secondary | ICD-10-CM

## 2021-05-13 DIAGNOSIS — K219 Gastro-esophageal reflux disease without esophagitis: Secondary | ICD-10-CM | POA: Diagnosis not present

## 2021-05-13 DIAGNOSIS — R300 Dysuria: Secondary | ICD-10-CM | POA: Diagnosis not present

## 2021-05-13 DIAGNOSIS — Z0001 Encounter for general adult medical examination with abnormal findings: Secondary | ICD-10-CM

## 2021-05-13 DIAGNOSIS — G4733 Obstructive sleep apnea (adult) (pediatric): Secondary | ICD-10-CM

## 2021-05-13 DIAGNOSIS — J449 Chronic obstructive pulmonary disease, unspecified: Secondary | ICD-10-CM | POA: Diagnosis not present

## 2021-05-13 DIAGNOSIS — E876 Hypokalemia: Secondary | ICD-10-CM

## 2021-05-13 DIAGNOSIS — R609 Edema, unspecified: Secondary | ICD-10-CM | POA: Diagnosis not present

## 2021-05-13 LAB — POCT GLYCOSYLATED HEMOGLOBIN (HGB A1C): Hemoglobin A1C: 6 % — AB (ref 4.0–5.6)

## 2021-05-13 MED ORDER — POTASSIUM CHLORIDE ER 8 MEQ PO TBCR: 8.0000 meq | EXTENDED_RELEASE_TABLET | ORAL | 1 refills | Status: DC | PRN

## 2021-05-13 MED ORDER — OMEPRAZOLE 40 MG PO CPDR: 40.0000 mg | DELAYED_RELEASE_CAPSULE | Freq: Every day | ORAL | 3 refills | Status: DC

## 2021-05-13 NOTE — Progress Notes (Signed)
[facility] [place], [place] [address]  Internal MEDICINE  Office Visit Note  Patient Name: Amber Holder  [id number]  [id number]  Date of Service: 05/13/2021  Chief Complaint  Patient presents with   Medicare Wellness   Anxiety   COPD   Diabetes     HPI Pt is here for routine health maintenance examination -Just put compression stockings on this AM, still having LE swelling. Was planning to order new compression stockings but hasnt yet. -Has not been taking much of lasix because not swelling in AM, only takes when swelling is bad and gets painful. Doesn't want to take lasix regularly because of side effect concerns.  -Does get SOB with walking distance or exertion -Discussed using lasix more regularly to help reduce LE edema and help with SOB if this is due to fluid overload. Did have echo last year with diastolic dysfunction and may need repeat or cardiology referral. Will repeat CMP with regular lasix use -Did have a cold a few weeks ago, but that resolved -Burping a lot lately and will try reflux medication -Foot exam done today -patient continues to smoke and cannot tolerate CPAP and refuses ONO for oxygen  Current Medication: Outpatient Encounter Medications as of 05/13/2021  Medication Sig   acetaminophen (TYLENOL) 500 MG chewable tablet Chew 1,000 mg by mouth every 8 (eight) hours as needed for pain.   albuterol (VENTOLIN HFA) 108 (90 Base) MCG/ACT inhaler Inhale 2 puffs into the lungs every 6 (six) hours as needed for wheezing or shortness of breath.   atorvastatin (LIPITOR) 10 MG tablet Take one tab every other day for high cholesterol   cholecalciferol (VITAMIN D) 400 UNITS TABS tablet Take 2,000 Units by mouth daily.    CRANBERRY PO Take 25,000 mg by mouth daily.   ergocalciferol (DRISDOL) 1.25 MG (50000 UT) capsule Take one cap q week   fluticasone (FLOVENT HFA) 110 MCG/ACT inhaler Inhale 2 puffs into the lungs 2 (two) times daily. Use 2  puffs bid for congestion   furosemide (LASIX) 20 MG tablet Take 1 tablet (20 mg total) by mouth daily.   glucose blood (ONETOUCH VERIO) test strip BLOOD SUGAR TESTING ONCE DAILY . DX E11.65   hydrocortisone cream 0.5 % Apply topically as needed.    ibandronate (BONIVA) 150 MG tablet Take 1 tablet (150 mg total) by mouth every 30 (thirty) days.   Lancets (ONETOUCH DELICA PLUS Q000111Q) MISC Use  as directed twice a daily DX E11.65   Magnesium 250 MG TABS Take by mouth daily.   meloxicam (MOBIC) 7.5 MG tablet TAKE 1 TABLET (7.5 MG TOTAL) BY MOUTH 2 (TWO) TIMES A DAY.   montelukast (SINGULAIR) 10 MG tablet TAKE 1 TABLET BY MOUTH DAILY FOR ALLERGIES   omeprazole (PRILOSEC) 40 MG capsule Take 1 capsule (40 mg total) by mouth daily.   tiotropium (SPIRIVA HANDIHALER) 18 MCG inhalation capsule Place 1 capsule (18 mcg total) into inhaler and inhale daily. For copd   [DISCONTINUED] dicyclomine (BENTYL) 10 MG capsule    [DISCONTINUED] potassium chloride (KLOR-CON) 8 MEQ tablet Take 1 tablet (8 mEq total) by mouth as needed (only take when taking Lasix).   [DISCONTINUED] triamcinolone cream (KENALOG) 0.1 % Apply topically 2 (two) times daily.   potassium chloride (KLOR-CON) 8 MEQ tablet Take 1 tablet (8 mEq total) by mouth as needed (only take when taking Lasix).   SHINGRIX injection    No facility-administered encounter medications on file as of 05/13/2021.    Surgical  History: Past Surgical History:  Procedure Laterality Date   APPENDECTOMY     cataract surgery Bilateral    CHOLECYSTECTOMY     COLONOSCOPY     COLONOSCOPY WITH PROPOFOL N/A 05/28/2015   Procedure: COLONOSCOPY WITH PROPOFOL;  Surgeon: Lollie Sails, MD;  Location: [facility] ENDOSCOPY;  Service: Endoscopy;  Laterality: N/A;   COLONOSCOPY WITH PROPOFOL N/A 10/28/2018   Procedure: COLONOSCOPY WITH PROPOFOL;  Surgeon: Lollie Sails, MD;  Location: [facility] ENDOSCOPY;  Service: Endoscopy;  Laterality: N/A;   EYE SURGERY Bilateral 2015    Cataract Extraction with IOL   LITHOTRIPSY  2015   has had many stones   Mastoidotomy  1970   ROTATOR CUFF REPAIR Right 2011   TOTAL HIP ARTHROPLASTY Right 06/14/2016   Procedure: TOTAL HIP ARTHROPLASTY;  Surgeon: Dereck Leep, MD;  Location: [facility] ORS;  Service: Orthopedics;  Laterality: Right;    Medical History: Past Medical History:  Diagnosis Date   Anxiety 01/02/2012   Arthritis    Asthma    Chronic kidney disease    Kidney stones   Colon polyps    COPD (chronic obstructive pulmonary disease) (HCC)    Diabetes mellitus without complication (HCC)    GERD (gastroesophageal reflux disease)    HOH (hard of hearing)    Bilateral hearing aids   Kidney stone    Osteoporosis    Sleep apnea    Does not use C-PAP on a regular basis   Venous stasis     Family History: Family History  Problem Relation Age of Onset   Breast cancer Maternal Aunt 42   Bladder Cancer Neg Hx    Kidney cancer Neg Hx       Review of Systems  Constitutional:  Positive for fatigue. Negative for chills and unexpected weight change.  HENT:  Negative for congestion, postnasal drip, rhinorrhea, sneezing and sore throat.   Eyes:  Negative for redness.  Respiratory:  Positive for apnea and shortness of breath. Negative for cough and chest tightness.   Cardiovascular:  Positive for leg swelling. Negative for chest pain and palpitations.  Gastrointestinal:  Negative for abdominal pain, constipation, diarrhea, nausea and vomiting.  Genitourinary:  Negative for dysuria and frequency.  Musculoskeletal:  Positive for arthralgias and back pain. Negative for joint swelling and neck pain.  Skin:  Negative for rash.  Neurological: Negative.  Negative for tremors and numbness.  Hematological:  Negative for adenopathy. Does not bruise/bleed easily.  Psychiatric/Behavioral:  Negative for behavioral problems (Depression), sleep disturbance and suicidal ideas. The patient is not nervous/anxious.     Vital  Signs: BP 120/70   Pulse 75   Temp 97.8 F (36.6 C)   Resp 16   Ht '5\' 6"'$  (1.676 m)   Wt 185 lb (83.9 kg)   SpO2 95%   BMI 29.86 kg/m    Physical Exam Vitals and nursing note reviewed.  Constitutional:      General: She is not in acute distress.    Appearance: She is well-developed. She is obese. She is not diaphoretic.  HENT:     Head: Normocephalic and atraumatic.     Mouth/Throat:     Pharynx: No oropharyngeal exudate.  Eyes:     Pupils: Pupils are equal, round, and reactive to light.  Neck:     Thyroid: No thyromegaly.     Vascular: No JVD.     Trachea: No tracheal deviation.  Cardiovascular:     Rate and Rhythm: Normal rate and regular rhythm.  Pulses:          Dorsalis pedis pulses are 3+ on the right side and 3+ on the left side.       Posterior tibial pulses are 3+ on the right side and 3+ on the left side.     Heart sounds: Normal heart sounds. No murmur heard.   No friction rub. No gallop.  Pulmonary:     Effort: Pulmonary effort is normal. No respiratory distress.     Breath sounds: No wheezing or rales.  Chest:     Chest wall: No tenderness.  Breasts:    Right: Normal. No mass.     Left: Normal. No mass.  Abdominal:     General: Bowel sounds are normal.     Palpations: Abdomen is soft.  Musculoskeletal:        General: Normal range of motion.     Cervical back: Normal range of motion and neck supple.     Right lower leg: Edema present.     Left lower leg: Edema present.     Right foot: Normal range of motion.     Left foot: Normal range of motion.  Feet:     Right foot:     Protective Sensation: 2 sites tested.  2 sites sensed.     Skin integrity: Skin integrity normal.     Toenail Condition: Right toenails are normal.     Left foot:     Protective Sensation: 2 sites tested.  2 sites sensed.     Skin integrity: Skin integrity normal.     Toenail Condition: Left toenails are normal.  Lymphadenopathy:     Cervical: No cervical adenopathy.   Skin:    General: Skin is warm and dry.  Neurological:     Mental Status: She is alert and oriented to person, place, and time.     Cranial Nerves: No cranial nerve deficit.  Psychiatric:        Behavior: Behavior normal.        Thought Content: Thought content normal.        Judgment: Judgment normal.     LABS: Recent Results (from the past 2160 hour(s))  POCT HgB A1C     Status: Abnormal   Collection Time: 05/13/21  2:44 PM  Result Value Ref Range   Hemoglobin A1C 6.0 (A) 4.0 - 5.6 %   HbA1c POC (<> result, manual entry)     HbA1c, POC (prediabetic range)     HbA1c, POC (controlled diabetic range)          Assessment/Plan: 1. Encounter for general adult medical examination with abnormal findings CPE performed, will update CMP with lasix use - Comprehensive metabolic panel  2. Visit for gynecologic examination Breast exam performed  3. Type 2 diabetes mellitus without complication, without long-term current use of insulin (HCC) - POCT HgB A1C is 6.0, will continue to control with diet and exercise  4. Peripheral edema Will take lasix daily to help with edema and SOB due to fluid overload, will update CMP in 2-3 weeks to monitor. May need repeat echo or cardiology referral. Will call if acute worsening. Continue compression stockings and elevate legs. - Comprehensive metabolic panel  5. Hypokalemia May continue potassium with lasix use. Will update CMP - potassium chloride (KLOR-CON) 8 MEQ tablet; Take 1 tablet (8 mEq total) by mouth as needed (only take when taking Lasix).  Dispense: 90 tablet; Refill: 1  6. Chronic obstructive pulmonary disease, unspecified COPD type ([place])  Continue inhalers as prescribed  7. Gastroesophageal reflux disease without esophagitis Will start omeprazole - omeprazole (PRILOSEC) 40 MG capsule; Take 1 capsule (40 mg total) by mouth daily.  Dispense: 30 capsule; Refill: 3  8. Mixed hyperlipidemia Continue lipitor  9. OSA (obstructive  sleep apnea) Cannot tolerate CPAP  10. Dysuria - UA/M w/rflx Culture, Routine   General Counseling: Jnai verbalizes understanding of the findings of todays visit and agrees with plan of treatment. I have discussed any further diagnostic evaluation that may be needed or ordered today. We also reviewed her medications today. she has been encouraged to call the office with any questions or concerns that should arise related to todays visit.    Counseling:    Orders Placed This Encounter  Procedures   UA/M w/rflx Culture, Routine   Comprehensive metabolic panel   POCT HgB A1C    Meds ordered this encounter  Medications   omeprazole (PRILOSEC) 40 MG capsule    Sig: Take 1 capsule (40 mg total) by mouth daily.    Dispense:  30 capsule    Refill:  3   potassium chloride (KLOR-CON) 8 MEQ tablet    Sig: Take 1 tablet (8 mEq total) by mouth as needed (only take when taking Lasix).    Dispense:  90 tablet    Refill:  1    This patient was seen by Drema Dallas, PA-C in collaboration with Dr. Clayborn Bigness as a part of collaborative care agreement.  Total time spent:40 Minutes  Time spent includes review of chart, medications, test results, and follow up plan with the patient.     Lavera Guise, MD  Internal Medicine

## 2021-05-20 ENCOUNTER — Other Ambulatory Visit: Payer: Self-pay | Admitting: Nurse Practitioner

## 2021-05-20 ENCOUNTER — Other Ambulatory Visit: Payer: Self-pay | Admitting: Internal Medicine

## 2021-05-20 DIAGNOSIS — M810 Age-related osteoporosis without current pathological fracture: Secondary | ICD-10-CM

## 2021-05-20 DIAGNOSIS — J449 Chronic obstructive pulmonary disease, unspecified: Secondary | ICD-10-CM

## 2021-05-20 LAB — URINE CULTURE, REFLEX

## 2021-05-20 LAB — UA/M W/RFLX CULTURE, ROUTINE
Bilirubin, UA: NEGATIVE
Glucose, UA: NEGATIVE
Ketones, UA: NEGATIVE
Nitrite, UA: NEGATIVE
Protein,UA: NEGATIVE
RBC, UA: NEGATIVE
Specific Gravity, UA: 1.019 (ref 1.005–1.030)
Urobilinogen, Ur: 0.2 mg/dL (ref 0.2–1.0)
pH, UA: 5 (ref 5.0–7.5)

## 2021-05-20 LAB — MICROSCOPIC EXAMINATION
Casts: NONE SEEN /lpf
WBC, UA: >30 /hpf — AB (ref 0–5)

## 2021-05-24 ENCOUNTER — Other Ambulatory Visit: Payer: Self-pay | Admitting: Physician Assistant

## 2021-05-24 ENCOUNTER — Telehealth: Payer: Self-pay

## 2021-05-24 DIAGNOSIS — N3000 Acute cystitis without hematuria: Secondary | ICD-10-CM

## 2021-05-24 MED ORDER — CIPROFLOXACIN HCL 500 MG PO TABS
500.0000 mg | ORAL_TABLET | Freq: Two times a day (BID) | ORAL | 0 refills | Status: DC
Start: 2021-05-24 — End: 2021-05-25

## 2021-05-24 NOTE — Telephone Encounter (Signed)
LMOM informing pt that her lab showed she has a UTI and that we sent in antibiotics to her pharmacy.  Advised to call back if any questions

## 2021-05-24 NOTE — Telephone Encounter (Signed)
-----   Message from Mylinda Latina, PA-C sent at 05/24/2021  2:54 PM EDT ----- Please let pt know she has a uti and I [facility]

## 2021-05-25 ENCOUNTER — Other Ambulatory Visit: Payer: Self-pay

## 2021-05-25 ENCOUNTER — Ambulatory Visit
Admission: RE | Admit: 2021-05-25 | Discharge: 2021-05-25 | Disposition: A | Discharge status: Home / Self Care | Payer: Medicare Other | Attending: Physician Assistant | Admitting: Physician Assistant

## 2021-05-25 ENCOUNTER — Other Ambulatory Visit: Payer: Self-pay | Admitting: Urology

## 2021-05-25 ENCOUNTER — Encounter: Payer: Self-pay | Admitting: Urology

## 2021-05-25 ENCOUNTER — Ambulatory Visit
Admission: RE | Admit: 2021-05-25 | Discharge: 2021-05-25 | Disposition: A | Discharge status: Home / Self Care | Payer: Medicare Other | Source: Ambulatory Visit | Attending: Urology | Admitting: Urology

## 2021-05-25 ENCOUNTER — Ambulatory Visit (INDEPENDENT_AMBULATORY_CARE_PROVIDER_SITE_OTHER): Payer: Medicare Other | Admitting: Urology

## 2021-05-25 VITALS — BP 119/71 | HR 79 | Ht 66.0 in | Wt 190.0 lb

## 2021-05-25 DIAGNOSIS — N3000 Acute cystitis without hematuria: Secondary | ICD-10-CM | POA: Diagnosis not present

## 2021-05-25 DIAGNOSIS — N390 Urinary tract infection, site not specified: Secondary | ICD-10-CM | POA: Diagnosis not present

## 2021-05-25 DIAGNOSIS — N200 Calculus of kidney: Secondary | ICD-10-CM

## 2021-05-25 MED ORDER — CIPROFLOXACIN HCL 500 MG PO TABS: 500.0000 mg | ORAL_TABLET | Freq: Two times a day (BID) | ORAL | 0 refills | Status: AC

## 2021-05-25 NOTE — Addendum Note (Signed)
Addended by: Donalee Citrin on: 05/25/2021 01:58 PM   Modules accepted: Orders

## 2021-05-25 NOTE — Progress Notes (Signed)
   05/25/2021 1:51 PM   Alexie Lanni [birth year] [id number]  Reason for visit: Follow up recurrent UTI, nephrolithiasis  HPI: 75 year old female who previously was managed by Dr. Jacqlyn Larsen for the above issues.  She previously was on long-term nitrofurantoin prophylaxis, and we were able to discontinue this and change her over to cranberry tablet prophylaxis.  She has a known 6 mm nonobstructing left upper pole stone that has been stable.  I personally viewed and interpreted her KUB today that shows no significant change in the left upper pole stone.  She was recently found to have an Enterobacter UTI on 05/13/2021, and was recently started on Cipro by her PCP.  She has not yet picked up this medication.  Her symptoms are some dysuria and urgency, and some bilateral flank pain.  She denies any fevers or chills.  I do not see any other prior positive urine cultures this year.  If she is getting more than 2 UTIs per year moving forward on the cranberry tablet prophylaxis, could consider topical estrogen cream, or left ureteroscopy and stone removal in the setting of her recurrent infections  -Complete Cipro for acute UTI -RTC 1 year KUB prior -Consider topical estrogen cream in the future or left ureteroscopy/laser lithotripsy if more than 2 infections per year moving forward  [place], [place] [address] [place], [place] [address] [phone]

## 2021-05-25 NOTE — Addendum Note (Signed)
Addended by: Donalee Citrin on: 05/25/2021 03:34 PM   Modules accepted: Orders

## 2021-05-26 LAB — URINALYSIS, COMPLETE
Bilirubin, UA: NEGATIVE
Glucose, UA: NEGATIVE
Ketones, UA: NEGATIVE
Leukocytes,UA: NEGATIVE
Nitrite, UA: NEGATIVE
Protein,UA: NEGATIVE
RBC, UA: NEGATIVE
Specific Gravity, UA: 1.01 (ref 1.005–1.030)
Urobilinogen, Ur: 0.2 mg/dL (ref 0.2–1.0)
pH, UA: 5.5 (ref 5.0–7.5)

## 2021-05-26 LAB — MICROSCOPIC EXAMINATION
Bacteria, UA: NONE SEEN
RBC, Urine: NONE SEEN /HPF (ref 0–2)

## 2021-06-13 DIAGNOSIS — Z0001 Encounter for general adult medical examination with abnormal findings: Secondary | ICD-10-CM | POA: Diagnosis not present

## 2021-06-13 DIAGNOSIS — R609 Edema, unspecified: Secondary | ICD-10-CM | POA: Diagnosis not present

## 2021-06-14 DIAGNOSIS — E1165 Type 2 diabetes mellitus with hyperglycemia: Secondary | ICD-10-CM | POA: Diagnosis not present

## 2021-06-14 DIAGNOSIS — Z96641 Presence of right artificial hip joint: Secondary | ICD-10-CM | POA: Diagnosis not present

## 2021-06-14 LAB — COMPREHENSIVE METABOLIC PANEL
ALT: 16 IU/L (ref 0–32)
AST: 18 IU/L (ref 0–40)
Albumin/Globulin Ratio: 1.9 (ref 1.2–2.2)
Albumin: 4.4 g/dL (ref 3.7–4.7)
Alkaline Phosphatase: 69 IU/L (ref 44–121)
BUN/Creatinine Ratio: 27 (ref 12–28)
BUN: 17 mg/dL (ref 8–27)
Bilirubin Total: 0.5 mg/dL (ref 0.0–1.2)
CO2: 24 mmol/L (ref 20–29)
Calcium: 9.9 mg/dL (ref 8.7–10.3)
Chloride: 105 mmol/L (ref 96–106)
Creatinine, Ser: 0.63 mg/dL (ref 0.57–1.00)
Globulin, Total: 2.3 g/dL (ref 1.5–4.5)
Glucose: 102 mg/dL — ABNORMAL HIGH (ref 70–99)
Potassium: 4.8 mmol/L (ref 3.5–5.2)
Sodium: 141 mmol/L (ref 134–144)
Total Protein: 6.7 g/dL (ref 6.0–8.5)
eGFR: 92 mL/min/{1.73_m2} (ref >59)

## 2021-06-20 ENCOUNTER — Other Ambulatory Visit: Payer: Self-pay

## 2021-06-20 ENCOUNTER — Ambulatory Visit (INDEPENDENT_AMBULATORY_CARE_PROVIDER_SITE_OTHER): Payer: Medicare Other | Admitting: Physician Assistant

## 2021-06-20 ENCOUNTER — Encounter: Payer: Self-pay | Admitting: Physician Assistant

## 2021-06-20 DIAGNOSIS — R609 Edema, unspecified: Secondary | ICD-10-CM

## 2021-06-20 DIAGNOSIS — E559 Vitamin D deficiency, unspecified: Secondary | ICD-10-CM | POA: Diagnosis not present

## 2021-06-20 DIAGNOSIS — E782 Mixed hyperlipidemia: Secondary | ICD-10-CM

## 2021-06-20 DIAGNOSIS — J209 Acute bronchitis, unspecified: Secondary | ICD-10-CM

## 2021-06-20 DIAGNOSIS — J449 Chronic obstructive pulmonary disease, unspecified: Secondary | ICD-10-CM | POA: Diagnosis not present

## 2021-06-20 DIAGNOSIS — M810 Age-related osteoporosis without current pathological fracture: Secondary | ICD-10-CM | POA: Diagnosis not present

## 2021-06-20 DIAGNOSIS — R300 Dysuria: Secondary | ICD-10-CM | POA: Diagnosis not present

## 2021-06-20 DIAGNOSIS — R600 Localized edema: Secondary | ICD-10-CM

## 2021-06-20 LAB — POCT URINALYSIS DIPSTICK
Bilirubin, UA: NEGATIVE
Glucose, UA: NEGATIVE
Leukocytes, UA: NEGATIVE
Nitrite, UA: NEGATIVE
Protein, UA: NEGATIVE
Spec Grav, UA: 1.015 (ref 1.010–1.025)
Urobilinogen, UA: 0.2 E.U./dL
pH, UA: 6 (ref 5.0–8.0)

## 2021-06-20 MED ORDER — FLUTICASONE PROPIONATE HFA 110 MCG/ACT IN AERO: 2.0000 | INHALATION_SPRAY | Freq: Two times a day (BID) | RESPIRATORY_TRACT | 12 refills | Status: DC

## 2021-06-20 MED ORDER — IBANDRONATE SODIUM 150 MG PO TABS: 150.0000 mg | ORAL_TABLET | ORAL | 3 refills | Status: DC

## 2021-06-20 MED ORDER — ALBUTEROL SULFATE HFA 108 (90 BASE) MCG/ACT IN AERS: 2.0000 | INHALATION_SPRAY | Freq: Four times a day (QID) | RESPIRATORY_TRACT | 2 refills | Status: DC | PRN

## 2021-06-20 MED ORDER — ERGOCALCIFEROL 1.25 MG (50000 UT) PO CAPS: ORAL_CAPSULE | ORAL | 3 refills | Status: DC

## 2021-06-20 NOTE — Progress Notes (Signed)
[facility] [place], [place] [address]  Internal MEDICINE  Office Visit Note  Patient Name: Amber Holder  [id number]  [id number]  Date of Service: 06/26/2021  Chief Complaint  Patient presents with   Follow-up    Refills, discuss legs   Diabetes   Gastroesophageal Reflux   Sleep Apnea   Anxiety   Asthma   COPD   Urinary Tract Infection    Burning, urgency, not making it to the bathroom in time, back pain and history or kidney stones, pt has IBS, has lower abd pain     HPI Pt is here for routine follow up -She was advised to take lasix for LE edema in addition to wearing compression stockings last visit. She does not like taking lasix, but discussed why it is needed in order to keep swelling down. Also not wearing compression stockings today and advised to wear daily and elevated legs -has some difficulty walking, partially due to hip and partially due to leg swelling and reduced movement overall lately. May need to consider PT -Would like to recheck urine today, does have kidney stones and sees urology. Has some incontinence and states she used to take a medication and may re-discuss with urology. -Breathing has been ok, needs refills of her inhalers  Current Medication: Outpatient Encounter Medications as of 06/20/2021  Medication Sig   acetaminophen (TYLENOL) 500 MG chewable tablet Chew 1,000 mg by mouth every 8 (eight) hours as needed for pain.   atorvastatin (LIPITOR) 10 MG tablet Take one tab every other day for high cholesterol   cholecalciferol (VITAMIN D) 400 UNITS TABS tablet Take 2,000 Units by mouth daily.    CRANBERRY PO Take 25,000 mg by mouth daily.   furosemide (LASIX) 20 MG tablet Take 1 tablet (20 mg total) by mouth daily.   glucose blood (ONETOUCH VERIO) test strip BLOOD SUGAR TESTING ONCE DAILY . DX E11.65   hydrocortisone cream 0.5 % Apply topically as needed.    Lancets (ONETOUCH DELICA PLUS [id number]) MISC Use  as directed  twice a daily DX E11.65   Magnesium 250 MG TABS Take by mouth daily.   montelukast (SINGULAIR) 10 MG tablet TAKE 1 TABLET BY MOUTH DAILY FOR ALLERGIES   omeprazole (PRILOSEC) 40 MG capsule Take 1 capsule (40 mg total) by mouth daily.   potassium chloride (KLOR-CON) 8 MEQ tablet Take 1 tablet (8 mEq total) by mouth as needed (only take when taking Lasix).   tiotropium (SPIRIVA HANDIHALER) 18 MCG inhalation capsule Place 1 capsule (18 mcg total) into inhaler and inhale daily. For copd   [DISCONTINUED] albuterol (VENTOLIN HFA) 108 (90 Base) MCG/ACT inhaler Inhale 2 puffs into the lungs every 6 (six) hours as needed for wheezing or shortness of breath.   [DISCONTINUED] ergocalciferol (DRISDOL) 1.25 MG (50000 UT) capsule Take one cap q week   [DISCONTINUED] fluticasone (FLOVENT HFA) 110 MCG/ACT inhaler Inhale 2 puffs into the lungs 2 (two) times daily. Use 2 puffs bid for congestion   [DISCONTINUED] ibandronate (BONIVA) 150 MG tablet TAKE 1 TABLET (150 MG TOTAL) BY MOUTH EVERY 30 (THIRTY) DAYS.   albuterol (VENTOLIN HFA) 108 (90 Base) MCG/ACT inhaler Inhale 2 puffs into the lungs every 6 (six) hours as needed for wheezing or shortness of breath.   ergocalciferol (DRISDOL) 1.25 MG (50000 UT) capsule Take one cap q week   fluticasone (FLOVENT HFA) 110 MCG/ACT inhaler Inhale 2 puffs into the lungs 2 (two) times daily. Use 2 puffs bid for congestion  ibandronate (BONIVA) 150 MG tablet Take 1 tablet (150 mg total) by mouth every 30 (thirty) days.   [DISCONTINUED] meloxicam (MOBIC) 7.5 MG tablet TAKE 1 TABLET (7.5 MG TOTAL) BY MOUTH 2 (TWO) TIMES A DAY. (Patient not taking: Reported on 06/20/2021)   No facility-administered encounter medications on file as of 06/20/2021.    Surgical History: Past Surgical History:  Procedure Laterality Date   APPENDECTOMY     cataract surgery Bilateral    CHOLECYSTECTOMY     COLONOSCOPY     COLONOSCOPY WITH PROPOFOL N/A 05/28/2015   Procedure: COLONOSCOPY WITH  PROPOFOL;  Surgeon: Lollie Sails, MD;  Location: [facility] ENDOSCOPY;  Service: Endoscopy;  Laterality: N/A;   COLONOSCOPY WITH PROPOFOL N/A 10/28/2018   Procedure: COLONOSCOPY WITH PROPOFOL;  Surgeon: Lollie Sails, MD;  Location: [facility] ENDOSCOPY;  Service: Endoscopy;  Laterality: N/A;   EYE SURGERY Bilateral 2015   Cataract Extraction with IOL   LITHOTRIPSY  2015   has had many stones   Mastoidotomy  1970   ROTATOR CUFF REPAIR Right 2011   TOTAL HIP ARTHROPLASTY Right 06/14/2016   Procedure: TOTAL HIP ARTHROPLASTY;  Surgeon: Dereck Leep, MD;  Location: [facility] ORS;  Service: Orthopedics;  Laterality: Right;    Medical History: Past Medical History:  Diagnosis Date   Anxiety 01/02/2012   Arthritis    Asthma    Chronic kidney disease    Kidney stones   Colon polyps    COPD (chronic obstructive pulmonary disease) (HCC)    Diabetes mellitus without complication (HCC)    GERD (gastroesophageal reflux disease)    HOH (hard of hearing)    Bilateral hearing aids   Kidney stone    Osteoporosis    Sleep apnea    Does not use C-PAP on a regular basis   Venous stasis     Family History: Family History  Problem Relation Age of Onset   Breast cancer Maternal Aunt 31   Bladder Cancer Neg Hx    Kidney cancer Neg Hx     Social History   Socioeconomic History   Marital status: Divorced    Spouse name: Not on file   Number of children: Not on file   Years of education: Not on file   Highest education level: Not on file  Occupational History   Not on file  Tobacco Use   Smoking status: Every Day    Packs/day: 1.00    Types: Cigarettes   Smokeless tobacco: Never  Vaping Use   Vaping Use: Never used  Substance and Sexual Activity   Alcohol use: Yes    Comment: very rarely    Drug use: No   Sexual activity: Not Currently  Other Topics Concern   Not on file  Social History Narrative   Not on file   Social Determinants of Health   Financial Resource Strain: Not on file   Food Insecurity: Not on file  Transportation Needs: Not on file  Physical Activity: Not on file  Stress: Not on file  Social Connections: Not on file  Intimate Partner Violence: Not on file      Review of Systems  Constitutional:  Negative for chills, fatigue and unexpected weight change.  HENT:  Negative for congestion, postnasal drip, rhinorrhea, sneezing and sore throat.   Eyes:  Negative for redness.  Respiratory:  Positive for shortness of breath. Negative for cough and chest tightness.   Cardiovascular:  Positive for leg swelling. Negative for chest pain and palpitations.  Gastrointestinal:  Negative for abdominal pain, constipation, diarrhea, nausea and vomiting.  Genitourinary:  Negative for dysuria and frequency.  Musculoskeletal:  Positive for arthralgias and gait problem. Negative for back pain, joint swelling and neck pain.  Skin:  Negative for rash.  Neurological:  Negative for tremors and numbness.  Hematological:  Negative for adenopathy. Does not bruise/bleed easily.  Psychiatric/Behavioral:  Negative for behavioral problems (Depression), sleep disturbance and suicidal ideas. The patient is not nervous/anxious.    Vital Signs: BP 116/66   Pulse 76   Temp 98.3 F (36.8 C)   Resp 16   Ht 5\' 6"  (1.676 m)   Wt 191 lb 9.6 oz (86.9 kg)   SpO2 97%   BMI 30.93 kg/m    Physical Exam Vitals and nursing note reviewed.  Constitutional:      General: She is not in acute distress.    Appearance: She is well-developed. She is obese. She is not diaphoretic.  HENT:     Head: Normocephalic and atraumatic.     Mouth/Throat:     Pharynx: No oropharyngeal exudate.  Eyes:     Pupils: Pupils are equal, round, and reactive to light.  Neck:     Thyroid: No thyromegaly.     Vascular: No JVD.     Trachea: No tracheal deviation.  Cardiovascular:     Rate and Rhythm: Normal rate and regular rhythm.     Heart sounds: Normal heart sounds. No murmur heard.   No friction rub.  No gallop.  Pulmonary:     Effort: Pulmonary effort is normal. No respiratory distress.     Breath sounds: No wheezing or rales.  Chest:     Chest wall: No tenderness.  Abdominal:     General: Bowel sounds are normal.     Palpations: Abdomen is soft.  Musculoskeletal:        General: Normal range of motion.     Cervical back: Normal range of motion and neck supple.     Right lower leg: Edema present.     Left lower leg: Edema present.  Lymphadenopathy:     Cervical: No cervical adenopathy.  Skin:    General: Skin is warm and dry.  Neurological:     Mental Status: She is alert and oriented to person, place, and time.     Cranial Nerves: No cranial nerve deficit.  Psychiatric:        Behavior: Behavior normal.        Thought Content: Thought content normal.        Judgment: Judgment normal.       Assessment/Plan: 1. Peripheral edema Will increase lasix to 40mg  as needed on days with increased swelling, otherwise continue 20mg  daily. Continue potassium supplement with lasix. Advised to wear compression stockings and elevate legs.  Patient may need to see vascular to be fitted with appropriate compression stockings.  May also need to consider cardiology referral as well as physical therapy if continued decline in strength.  2. Chronic obstructive pulmonary disease, unspecified COPD type ([place]) Will continue inhalers as prescribed and as indicated - albuterol (VENTOLIN HFA) 108 (90 Base) MCG/ACT inhaler; Inhale 2 puffs into the lungs every 6 (six) hours as needed for wheezing or shortness of breath.  Dispense: 18 g; Refill: 2 - fluticasone (FLOVENT HFA) 110 MCG/ACT inhaler; Inhale 2 puffs into the lungs 2 (two) times daily. Use 2 puffs bid for congestion  Dispense: 1 each; Refill: 12  3. Vitamin D deficiency - ergocalciferol (DRISDOL)  1.25 MG (50000 UT) capsule; Take one cap q week  Dispense: 12 capsule; Refill: 3  4. Mixed hyperlipidemia Continue atorvastatin  5. Age-related  osteoporosis without current pathological fracture - ibandronate (BONIVA) 150 MG tablet; Take 1 tablet (150 mg total) by mouth every 30 (thirty) days.  Dispense: 3 tablet; Refill: 3  6. Dysuria - POCT Urinalysis Dipstick - CULTURE, URINE COMPREHENSIVE   General Counseling: Amber Holder verbalizes understanding of the findings of todays visit and agrees with plan of treatment. I have discussed any further diagnostic evaluation that may be needed or ordered today. We also reviewed her medications today. she has been encouraged to call the office with any questions or concerns that should arise related to todays visit.    Orders Placed This Encounter  Procedures   CULTURE, URINE COMPREHENSIVE   POCT Urinalysis Dipstick     Meds ordered this encounter  Medications   albuterol (VENTOLIN HFA) 108 (90 Base) MCG/ACT inhaler    Sig: Inhale 2 puffs into the lungs every 6 (six) hours as needed for wheezing or shortness of breath.    Dispense:  18 g    Refill:  2   ergocalciferol (DRISDOL) 1.25 MG (50000 UT) capsule    Sig: Take one cap q week    Dispense:  12 capsule    Refill:  3   fluticasone (FLOVENT HFA) 110 MCG/ACT inhaler    Sig: Inhale 2 puffs into the lungs 2 (two) times daily. Use 2 puffs bid for congestion    Dispense:  1 each    Refill:  12   ibandronate (BONIVA) 150 MG tablet    Sig: Take 1 tablet (150 mg total) by mouth every 30 (thirty) days.    Dispense:  3 tablet    Refill:  3    DX Code Needed  .     This patient was seen by Drema Dallas, PA-C in collaboration with Dr. Clayborn Bigness as a part of collaborative care agreement.   Total time spent:35 Minutes Time spent includes review of chart, medications, test results, and follow up plan with the patient.      Dr Lavera Guise Internal medicine

## 2021-06-23 LAB — CULTURE, URINE COMPREHENSIVE

## 2021-07-07 DIAGNOSIS — Z23 Encounter for immunization: Secondary | ICD-10-CM | POA: Diagnosis not present

## 2021-08-10 ENCOUNTER — Other Ambulatory Visit: Payer: Self-pay | Admitting: Internal Medicine

## 2021-08-15 ENCOUNTER — Other Ambulatory Visit: Payer: Self-pay | Admitting: Physician Assistant

## 2021-08-15 DIAGNOSIS — K219 Gastro-esophageal reflux disease without esophagitis: Secondary | ICD-10-CM

## 2021-08-17 DIAGNOSIS — Z23 Encounter for immunization: Secondary | ICD-10-CM | POA: Diagnosis not present

## 2021-09-08 ENCOUNTER — Other Ambulatory Visit: Payer: Self-pay

## 2021-09-08 ENCOUNTER — Encounter: Payer: Self-pay | Admitting: Nurse Practitioner

## 2021-09-08 ENCOUNTER — Ambulatory Visit (INDEPENDENT_AMBULATORY_CARE_PROVIDER_SITE_OTHER): Payer: Medicare Other | Admitting: Nurse Practitioner

## 2021-09-08 VITALS — BP 116/64 | HR 76 | Temp 98.1°F | Resp 16 | Ht 66.0 in | Wt 196.2 lb

## 2021-09-08 DIAGNOSIS — J449 Chronic obstructive pulmonary disease, unspecified: Secondary | ICD-10-CM

## 2021-09-08 DIAGNOSIS — Z91199 Patient's noncompliance with other medical treatment and regimen due to unspecified reason: Secondary | ICD-10-CM | POA: Diagnosis not present

## 2021-09-08 DIAGNOSIS — R0602 Shortness of breath: Secondary | ICD-10-CM | POA: Diagnosis not present

## 2021-09-08 NOTE — Patient Instructions (Signed)
Use flovent inhaler 2 puffs 2 times daily Use spiriva inhaler 1 puff 1 time daily.

## 2021-09-08 NOTE — Progress Notes (Signed)
[facility] [place], [place] [address]  Internal MEDICINE  Office Visit Note  Patient Name: Amber Holder  [id number]  [id number]  Date of Service: 09/08/2021  Chief Complaint  Patient presents with   Follow-up    Pain in back for 2 days pt not sure if it was with lungs, wheezing   COPD   Sleep Apnea   Asthma    HPI Amber Holder presents for a follow up visit for pulmonary and sleep. She reports wheezing more and feeling SOB. She has flovent and spiriva prescribed but only uses them as needed. She also has an albuterol rescue inhaler.  She has OSA and is supposed to use CPAP at night. She has tried to use it again but cannot sleep with the CPAP machine.  Her spirometry was done today and compared to her previous spirometry from July. Her FEV1 improved by 6% and her FVC also improved.    Current Medication: Outpatient Encounter Medications as of 09/08/2021  Medication Sig   acetaminophen (TYLENOL) 500 MG chewable tablet Chew 1,000 mg by mouth every 8 (eight) hours as needed for pain.   albuterol (VENTOLIN HFA) 108 (90 Base) MCG/ACT inhaler Inhale 2 puffs into the lungs every 6 (six) hours as needed for wheezing or shortness of breath.   atorvastatin (LIPITOR) 10 MG tablet Take one tab every other day for high cholesterol   cholecalciferol (VITAMIN D) 400 UNITS TABS tablet Take 2,000 Units by mouth daily.    CRANBERRY PO Take 25,000 mg by mouth daily.   ergocalciferol (DRISDOL) 1.25 MG (50000 UT) capsule Take one cap q week   fluticasone (FLOVENT HFA) 110 MCG/ACT inhaler Inhale 2 puffs into the lungs 2 (two) times daily. Use 2 puffs bid for congestion   furosemide (LASIX) 20 MG tablet Take 1 tablet (20 mg total) by mouth daily.   glucose blood (ONETOUCH VERIO) test strip BLOOD SUGAR TESTING ONCE DAILY . DX E11.65   hydrocortisone cream 0.5 % Apply topically as needed.    ibandronate (BONIVA) 150 MG tablet Take 1 tablet (150 mg total) by mouth every 30 (thirty)  days.   Lancets (ONETOUCH DELICA PLUS [id number]) MISC Use  as directed twice a daily DX E11.65   Magnesium 250 MG TABS Take by mouth daily.   montelukast (SINGULAIR) 10 MG tablet TAKE 1 TABLET BY MOUTH DAILY FOR ALLERGIES   omeprazole (PRILOSEC) 40 MG capsule TAKE 1 CAPSULE (40 MG TOTAL) BY MOUTH DAILY.   potassium chloride (KLOR-CON) 8 MEQ tablet Take 1 tablet (8 mEq total) by mouth as needed (only take when taking Lasix).   SPIRIVA HANDIHALER 18 MCG inhalation capsule PLACE 1 CAPSULE (18 MCG TOTAL) INTO INHALER AND INHALE DAILY. FOR COPD *DO NOT SWALLOW   No facility-administered encounter medications on file as of 09/08/2021.    Surgical History: Past Surgical History:  Procedure Laterality Date   APPENDECTOMY     cataract surgery Bilateral    CHOLECYSTECTOMY     COLONOSCOPY     COLONOSCOPY WITH PROPOFOL N/A 05/28/2015   Procedure: COLONOSCOPY WITH PROPOFOL;  Surgeon: Lollie Sails, MD;  Location[facility] ENDOSCOPY;  Service: Endoscopy;  Laterality: N/A;   COLONOSCOPY WITH PROPOFOL N/A 10/28/2018   Procedure: COLONOSCOPY WITH PROPOFOL;  Surgeon: Lollie Sails, MD;  Location: [facility] ENDOSCOPY;  Service: Endoscopy;  Laterality: N/A;   EYE SURGERY Bilateral 2015   Cataract Extraction with IOL   LITHOTRIPSY  2015   has had many stones   Mastoidotomy  1970   ROTATOR CUFF REPAIR Right 2011   TOTAL HIP ARTHROPLASTY Right 06/14/2016   Procedure: TOTAL HIP ARTHROPLASTY;  Surgeon: Dereck Leep, MD;  Location: [facility] ORS;  Service: Orthopedics;  Laterality: Right;    Medical History: Past Medical History:  Diagnosis Date   Anxiety 01/02/2012   Arthritis    Asthma    Chronic kidney disease    Kidney stones   Colon polyps    COPD (chronic obstructive pulmonary disease) (HCC)    Diabetes mellitus without complication (HCC)    GERD (gastroesophageal reflux disease)    HOH (hard of hearing)    Bilateral hearing aids   Kidney stone    Osteoporosis    Sleep apnea    Does not use C-PAP  on a regular basis   Venous stasis     Family History: Family History  Problem Relation Age of Onset   Breast cancer Maternal Aunt 44   Bladder Cancer Neg Hx    Kidney cancer Neg Hx     Social History   Socioeconomic History   Marital status: Divorced    Spouse name: Not on file   Number of children: Not on file   Years of education: Not on file   Highest education level: Not on file  Occupational History   Not on file  Tobacco Use   Smoking status: Every Day    Packs/day: 1.00    Types: Cigarettes   Smokeless tobacco: Never  Vaping Use   Vaping Use: Never used  Substance and Sexual Activity   Alcohol use: Yes    Comment: very rarely    Drug use: No   Sexual activity: Not Currently  Other Topics Concern   Not on file  Social History Narrative   Not on file   Social Determinants of Health   Financial Resource Strain: Not on file  Food Insecurity: Not on file  Transportation Needs: Not on file  Physical Activity: Not on file  Stress: Not on file  Social Connections: Not on file  Intimate Partner Violence: Not on file      Review of Systems  Constitutional:  Negative for chills, fatigue and unexpected weight change.  HENT:  Negative for congestion, rhinorrhea, sneezing and sore throat.   Eyes:  Negative for redness.  Respiratory:  Positive for shortness of breath and wheezing. Negative for cough and chest tightness.   Cardiovascular:  Negative for chest pain and palpitations.  Gastrointestinal:  Negative for abdominal pain, constipation, diarrhea, nausea and vomiting.  Genitourinary:  Negative for dysuria and frequency.  Musculoskeletal:  Negative for arthralgias, back pain, joint swelling and neck pain.  Skin:  Negative for rash.  Neurological: Negative.  Negative for tremors and numbness.  Hematological:  Negative for adenopathy. Does not bruise/bleed easily.  Psychiatric/Behavioral:  Negative for behavioral problems (Depression), sleep disturbance and  suicidal ideas. The patient is not nervous/anxious.    Vital Signs: BP 116/64    Pulse 76    Temp 98.1 F (36.7 C)    Resp 16    Ht 5\' 6"  (1.676 m)    Wt 196 lb 3.2 oz (89 kg)    SpO2 97%    BMI 31.67 kg/m    Physical Exam Vitals reviewed.  Constitutional:      General: She is not in acute distress.    Appearance: Normal appearance. She is obese. She is not ill-appearing.  HENT:     Head: Normocephalic and atraumatic.  Eyes:  Pupils: Pupils are equal, round, and reactive to light.  Cardiovascular:     Rate and Rhythm: Normal rate and regular rhythm.  Pulmonary:     Effort: Pulmonary effort is normal. No respiratory distress.     Breath sounds: Normal breath sounds. No wheezing.  Neurological:     Mental Status: She is alert and oriented to person, place, and time.     Cranial Nerves: No cranial nerve deficit.     Coordination: Coordination normal.     Gait: Gait normal.  Psychiatric:        Mood and Affect: Mood normal.        Behavior: Behavior normal.       Assessment/Plan: 1. Chronic obstructive pulmonary disease, unspecified COPD type ([place]) Spirometry improved some but patient reports wheezing and SOB most likely related her not taking her medication as prescribed. Discussed this in detail with patient. Follow up in 1 month to assess for improvement in COPD symptoms and respiratory status.   2. Shortness of breath Spirometry done in office today. - Spirometry with Graph  3. Non-adherence to medical treatment Discussed ordered medication regimen of flovent 2 puffs twice daily and spiriva 1 puff once daily. Discussed with patient how this regimen may improve her breathing and reduce inflammation and wheezing.  Counseling: Adherence of Medical Therapy: The patient understands that it is the responsibility of the patient to complete all prescribed medications, all recommended testing, including but not limited to, laboratory studies and imaging. The patient further  understands the need to keep all scheduled follow-up visits and to inform the office immediately of any changes in their medical condition. The patient understands that the success of treatment in large part depends on the patient's willingness to complete the therapeutic regimen and to work in partnership with the designated health-care providers.    General Counseling: Iasia verbalizes understanding of the findings of todays visit and agrees with plan of treatment. I have discussed any further diagnostic evaluation that may be needed or ordered today. We also reviewed her medications today. she has been encouraged to call the office with any questions or concerns that should arise related to todays visit.    Orders Placed This Encounter  Procedures   Spirometry with Graph    No orders of the defined types were placed in this encounter.   Return in about 1 month (around 10/09/2021) for F/U, pulmonary/sleep.   Total time spent:30 Minutes Time spent includes review of chart, medications, test results, and follow up plan with the patient.   [place] Controlled Substance Database was reviewed by me.  This patient was seen by Jonetta Osgood, FNP-C in collaboration with Dr. Clayborn Bigness as a part of collaborative care agreement.   Lasharon Dunivan R. Valetta Fuller, MSN, FNP-C Internal medicine

## 2021-09-19 DIAGNOSIS — E119 Type 2 diabetes mellitus without complications: Secondary | ICD-10-CM | POA: Diagnosis not present

## 2021-10-03 ENCOUNTER — Ambulatory Visit (INDEPENDENT_AMBULATORY_CARE_PROVIDER_SITE_OTHER): Payer: Medicare Other | Admitting: Physician Assistant

## 2021-10-03 ENCOUNTER — Encounter: Payer: Self-pay | Admitting: Physician Assistant

## 2021-10-03 ENCOUNTER — Other Ambulatory Visit: Payer: Self-pay

## 2021-10-03 ENCOUNTER — Telehealth: Payer: Self-pay

## 2021-10-03 VITALS — BP 134/86 | HR 86 | Temp 98.4°F | Resp 16 | Ht 66.0 in | Wt 195.6 lb

## 2021-10-03 DIAGNOSIS — R29898 Other symptoms and signs involving the musculoskeletal system: Secondary | ICD-10-CM | POA: Diagnosis not present

## 2021-10-03 DIAGNOSIS — Z86018 Personal history of other benign neoplasm: Secondary | ICD-10-CM | POA: Diagnosis not present

## 2021-10-03 DIAGNOSIS — E119 Type 2 diabetes mellitus without complications: Secondary | ICD-10-CM

## 2021-10-03 DIAGNOSIS — R2689 Other abnormalities of gait and mobility: Secondary | ICD-10-CM

## 2021-10-03 DIAGNOSIS — R0602 Shortness of breath: Secondary | ICD-10-CM

## 2021-10-03 DIAGNOSIS — R609 Edema, unspecified: Secondary | ICD-10-CM | POA: Diagnosis not present

## 2021-10-03 DIAGNOSIS — Z872 Personal history of diseases of the skin and subcutaneous tissue: Secondary | ICD-10-CM | POA: Diagnosis not present

## 2021-10-03 DIAGNOSIS — M25541 Pain in joints of right hand: Secondary | ICD-10-CM

## 2021-10-03 DIAGNOSIS — L578 Other skin changes due to chronic exposure to nonionizing radiation: Secondary | ICD-10-CM | POA: Diagnosis not present

## 2021-10-03 LAB — POCT GLYCOSYLATED HEMOGLOBIN (HGB A1C): Hemoglobin A1C: 5.7 % — AB (ref 4.0–5.6)

## 2021-10-03 NOTE — Telephone Encounter (Signed)
Awaiting 10/03/21 office notes for ortho referral-Toni
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
## 2021-10-12 ENCOUNTER — Encounter: Payer: Self-pay | Admitting: Nurse Practitioner

## 2021-10-12 ENCOUNTER — Other Ambulatory Visit: Payer: Self-pay

## 2021-10-12 ENCOUNTER — Ambulatory Visit (INDEPENDENT_AMBULATORY_CARE_PROVIDER_SITE_OTHER): Payer: Medicare Other | Admitting: Nurse Practitioner

## 2021-10-12 VITALS — BP 138/55 | HR 70 | Temp 98.2°F | Resp 16 | Ht 66.0 in | Wt 195.8 lb

## 2021-10-12 DIAGNOSIS — J449 Chronic obstructive pulmonary disease, unspecified: Secondary | ICD-10-CM | POA: Diagnosis not present

## 2021-10-12 DIAGNOSIS — R609 Edema, unspecified: Secondary | ICD-10-CM

## 2021-10-12 DIAGNOSIS — I5189 Other ill-defined heart diseases: Secondary | ICD-10-CM | POA: Diagnosis not present

## 2021-10-12 MED ORDER — FUROSEMIDE 20 MG PO TABS: 30.0000 mg | ORAL_TABLET | Freq: Every day | ORAL | 1 refills | Status: DC

## 2021-10-12 NOTE — Progress Notes (Unsigned)
[facility] [place], [place] [address]  Internal MEDICINE  Office Visit Note  Patient Name: Amber Holder  [id number]  [id number]  Date of Service: 10/12/2021  Chief Complaint  Patient presents with   Follow-up    Discuss meds   COPD   Asthma   Sleep Apnea    HPI Karlie presents for a follow-up visit for  Has tried taking flovent every day but has not noted much of a difference Takes spiriva every day Swelling in ankles and legs, increased lasix, is waiting to see cardiology.     Current Medication: Outpatient Encounter Medications as of 10/12/2021  Medication Sig   acetaminophen (TYLENOL) 500 MG chewable tablet Chew 1,000 mg by mouth every 8 (eight) hours as needed for pain.   albuterol (VENTOLIN HFA) 108 (90 Base) MCG/ACT inhaler Inhale 2 puffs into the lungs every 6 (six) hours as needed for wheezing or shortness of breath.   atorvastatin (LIPITOR) 10 MG tablet Take one tab every other day for high cholesterol   cholecalciferol (VITAMIN D) 400 UNITS TABS tablet Take 2,000 Units by mouth daily.    CRANBERRY PO Take 25,000 mg by mouth daily.   ergocalciferol (DRISDOL) 1.25 MG (50000 UT) capsule Take one cap q week   fluticasone (FLOVENT HFA) 110 MCG/ACT inhaler Inhale 2 puffs into the lungs 2 (two) times daily. Use 2 puffs bid for congestion   glucose blood (ONETOUCH VERIO) test strip BLOOD SUGAR TESTING ONCE DAILY . DX E11.65   hydrocortisone cream 0.5 % Apply topically as needed.    ibandronate (BONIVA) 150 MG tablet Take 1 tablet (150 mg total) by mouth every 30 (thirty) days.   Lancets (ONETOUCH DELICA PLUS [id number]) MISC Use  as directed twice a daily DX E11.65   Magnesium 250 MG TABS Take by mouth daily.   montelukast (SINGULAIR) 10 MG tablet TAKE 1 TABLET BY MOUTH DAILY FOR ALLERGIES   omeprazole (PRILOSEC) 40 MG capsule TAKE 1 CAPSULE (40 MG TOTAL) BY MOUTH DAILY.   potassium chloride (KLOR-CON) 8 MEQ tablet Take 1 tablet (8 mEq total) by  mouth as needed (only take when taking Lasix).   SPIRIVA HANDIHALER 18 MCG inhalation capsule PLACE 1 CAPSULE (18 MCG TOTAL) INTO INHALER AND INHALE DAILY. FOR COPD *DO NOT SWALLOW   [DISCONTINUED] furosemide (LASIX) 20 MG tablet Take 1 tablet (20 mg total) by mouth daily.   furosemide (LASIX) 20 MG tablet Take 1.5 tablets (30 mg total) by mouth daily.   No facility-administered encounter medications on file as of 10/12/2021.    Surgical History: Past Surgical History:  Procedure Laterality Date   APPENDECTOMY     cataract surgery Bilateral    CHOLECYSTECTOMY     COLONOSCOPY     COLONOSCOPY WITH PROPOFOL N/A 05/28/2015   Procedure: COLONOSCOPY WITH PROPOFOL;  Surgeon: Lollie Sails, MD;  Location: [facility] ENDOSCOPY;  Service: Endoscopy;  Laterality: N/A;   COLONOSCOPY WITH PROPOFOL N/A 10/28/2018   Procedure: COLONOSCOPY WITH PROPOFOL;  Surgeon: Lollie Sails, MD;  Location: [facility] ENDOSCOPY;  Service: Endoscopy;  Laterality: N/A;   EYE SURGERY Bilateral 2015   Cataract Extraction with IOL   LITHOTRIPSY  2015   has had many stones   Mastoidotomy  1970   ROTATOR CUFF REPAIR Right 2011   TOTAL HIP ARTHROPLASTY Right 06/14/2016   Procedure: TOTAL HIP ARTHROPLASTY;  Surgeon: Dereck Leep, MD;  Location: [facility] ORS;  Service: Orthopedics;  Laterality: Right;    Medical History: Past Medical  History:  Diagnosis Date   Anxiety 01/02/2012   Arthritis    Asthma    Chronic kidney disease    Kidney stones   Colon polyps    COPD (chronic obstructive pulmonary disease) (HCC)    Diabetes mellitus without complication (HCC)    GERD (gastroesophageal reflux disease)    HOH (hard of hearing)    Bilateral hearing aids   Kidney stone    Osteoporosis    Sleep apnea    Does not use C-PAP on a regular basis   Venous stasis     Family History: Family History  Problem Relation Age of Onset   Breast cancer Maternal Aunt 50   Bladder Cancer Neg Hx    Kidney cancer Neg Hx     Social  History   Socioeconomic History   Marital status: Divorced    Spouse name: Not on file   Number of children: Not on file   Years of education: Not on file   Highest education level: Not on file  Occupational History   Not on file  Tobacco Use   Smoking status: Every Day    Packs/day: 1.00    Types: Cigarettes   Smokeless tobacco: Never   Tobacco comments:    1 pack daily  Vaping Use   Vaping Use: Never used  Substance and Sexual Activity   Alcohol use: Yes    Comment: very rarely    Drug use: No   Sexual activity: Not Currently  Other Topics Concern   Not on file  Social History Narrative   Not on file   Social Determinants of Health   Financial Resource Strain: Not on file  Food Insecurity: Not on file  Transportation Needs: Not on file  Physical Activity: Not on file  Stress: Not on file  Social Connections: Not on file  Intimate Partner Violence: Not on file      Review of Systems  Vital Signs: BP (!) 138/55    Pulse 70    Temp 98.2 F (36.8 C)    Resp 16    Ht 5\' 6"  (1.676 m)    Wt 195 lb 12.8 oz (88.8 kg)    SpO2 97%    BMI 31.60 kg/m    Physical Exam     Assessment/Plan:   General Counseling: Bralee verbalizes understanding of the findings of todays visit and agrees with plan of treatment. I have discussed any further diagnostic evaluation that may be needed or ordered today. We also reviewed her medications today. she has been encouraged to call the office with any questions or concerns that should arise related to todays visit.    No orders of the defined types were placed in this encounter.   Meds ordered this encounter  Medications   furosemide (LASIX) 20 MG tablet    Sig: Take 1.5 tablets (30 mg total) by mouth daily.    Dispense:  135 tablet    Refill:  1    Note increased dose, discontinue all previous furosemide prescriptions.    Return in about 6 months (around 04/11/2022) for F/U, pulmonary/sleep, Cala Kruckenberg.   Total time  spent:*** Minutes Time spent includes review of chart, medications, test results, and follow up plan with the patient.   [place] Controlled Substance Database was reviewed by me.  This patient was seen by Jonetta Osgood, FNP-C in collaboration with Dr. Clayborn Bigness as a part of collaborative care agreement.   Brean Carberry R. Valetta Fuller, MSN, FNP-C Internal medicine

## 2021-10-14 NOTE — Telephone Encounter (Signed)
Referral sent via [facility] to [facility]

## 2021-10-20 ENCOUNTER — Ambulatory Visit: Payer: No Typology Code available for payment source | Admitting: Cardiology

## 2021-10-24 ENCOUNTER — Encounter: Payer: Self-pay | Admitting: Nurse Practitioner

## 2021-10-25 ENCOUNTER — Ambulatory Visit: Payer: Medicare Other | Attending: Physician Assistant

## 2021-10-25 ENCOUNTER — Other Ambulatory Visit: Payer: Self-pay

## 2021-10-25 DIAGNOSIS — M6281 Muscle weakness (generalized): Secondary | ICD-10-CM | POA: Insufficient documentation

## 2021-10-25 DIAGNOSIS — R2689 Other abnormalities of gait and mobility: Secondary | ICD-10-CM | POA: Insufficient documentation

## 2021-10-25 DIAGNOSIS — R29898 Other symptoms and signs involving the musculoskeletal system: Secondary | ICD-10-CM | POA: Insufficient documentation

## 2021-10-25 DIAGNOSIS — R2681 Unsteadiness on feet: Secondary | ICD-10-CM | POA: Diagnosis not present

## 2021-10-25 NOTE — Telephone Encounter (Signed)
Appointment> 11/01/21 @ 2:00-Toni

## 2021-10-25 NOTE — Therapy (Signed)
[place] MAIN [facility] SERVICES [address] [place], [place], [address] Phone: [phone]   Fax:  [phone]  Physical Therapy Evaluation  Patient Details  Name: Amber Holder MRN: [id number] Date of Birth: Apr 12, 1946 Referring Provider (PT): Drema Dallas PA   Encounter Date: 10/25/2021   PT End of Session - 10/25/21 1314     Visit Number 1    Number of Visits 24    Date for PT Re-Evaluation 01/17/22    Authorization Type 1/10 eval 2/28    PT Start Time 1100    PT Stop Time 1155    PT Time Calculation (min) 55 min    Equipment Utilized During Treatment Gait belt    Activity Tolerance Patient tolerated treatment well    Behavior During Therapy [facility] for tasks assessed/performed             Past Medical History:  Diagnosis Date   Anxiety 01/02/2012   Arthritis    Asthma    Chronic kidney disease    Kidney stones   Colon polyps    COPD (chronic obstructive pulmonary disease) ([place])    Diabetes mellitus without complication (HCC)    GERD (gastroesophageal reflux disease)    HOH (hard of hearing)    Bilateral hearing aids   Kidney stone    Osteoporosis    Sleep apnea    Does not use C-PAP on a regular basis   Venous stasis     Past Surgical History:  Procedure Laterality Date   APPENDECTOMY     cataract surgery Bilateral    CHOLECYSTECTOMY     COLONOSCOPY     COLONOSCOPY WITH PROPOFOL N/A 05/28/2015   Procedure: COLONOSCOPY WITH PROPOFOL;  Surgeon: Lollie Sails, MD;  Location: [facility] ENDOSCOPY;  Service: Endoscopy;  Laterality: N/A;   COLONOSCOPY WITH PROPOFOL N/A 10/28/2018   Procedure: COLONOSCOPY WITH PROPOFOL;  Surgeon: Lollie Sails, MD;  Location: [facility] ENDOSCOPY;  Service: Endoscopy;  Laterality: N/A;   EYE SURGERY Bilateral 2015   Cataract Extraction with IOL   LITHOTRIPSY  2015   has had many stones   Mastoidotomy  1970   ROTATOR CUFF REPAIR Right 2011   TOTAL HIP ARTHROPLASTY Right 06/14/2016    Procedure: TOTAL HIP ARTHROPLASTY;  Surgeon: Dereck Leep, MD;  Location: [facility] ORS;  Service: Orthopedics;  Laterality: Right;    There were no vitals filed for this visit.    Subjective Assessment - 10/25/21 1107     Subjective Patient is a 76 year old female who presents for LE weakness and balance deficits.    Pertinent History Patient is a 76 year old female who presents for LE weakness and balance deficits. PMH includes anxiety, arthritis, CKD, colon polyps, COPD, DM, GERD, HOH, Kidney stones, osteoporosis, sleep apnea, venous stasis. Lives alone in [place], [place]. She was originally from [place] but more recently from [place]. 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 She has three adult children who are healthy. Patient falls in the dark; had a fall at the beach last November as well. Patient has dizziness when driving on a bumpy rode. Limited in walking distance.    Limitations Lifting;Standing;Walking;House hold activities    How long can you stand comfortably? has difficulty washing dishes    How long can you walk comfortably? to mailbox    Patient Stated Goals to be able to walk; to improve balance    Currently in Pain? No/denies  [facility] PT Assessment - 10/25/21 0001       Assessment   Medical Diagnosis balance and LE strength    Referring Provider (PT) Mcdonough, Lauren PA    Onset Date/Surgical Date --   a few years and getting worse   Hand Dominance Right    Prior Therapy pulmonary rehab      Precautions   Precautions Fall      Restrictions   Weight Bearing Restrictions No      Balance Screen   Has the patient fallen in the past 6 months Yes    How many times? 2    Has the patient had a decrease in activity level because of a fear of falling?  Yes    Is the patient reluctant to leave their home because of a fear of falling?  No      Home Environment   Living Environment Private residence    Living Arrangements Alone    Available  Help at Discharge Family    Type of [place] to enter    Entrance Stairs-Number of Steps 7    [place] One level    [place] bars - tub/shower;Gr[place] bars - toilet      Prior Function   Level of Independence Independent    Leisure play golf; travel      Observation/Other Assessments   Focus on Therapeutic Outcomes (FOTO)  46      Standardized Balance Assessment   Standardized Balance Assessment Berg Balance Test      Berg Balance Test   Sit to Stand Able to stand  independently using hands    Standing Unsupported Able to stand 2 minutes with supervision    Sitting with Back Unsupported but Feet Supported on Floor or Stool Able to sit safely and securely 2 minutes    Stand to Sit Sits safely with minimal use of hands    Transfers Able to transfer safely, minor use of hands    Standing Unsupported with Eyes Closed Able to stand 3 seconds    Standing Unsupported with Feet Together Able to place feet together independently but unable to hold for 30 seconds    From Standing, Reach Forward with Outstretched Arm Reaches forward but needs supervision    From Standing Position, Pick up Object from Floor Able to pick up shoe, needs supervision    From Standing Position, Turn to Look Behind Over each Shoulder Looks behind from both sides and weight shifts well    Turn 360 Degrees Able to turn 360 degrees safely but slowly    Standing Unsupported, Alternately Place Feet on Step/Stool Able to complete 4 steps without aid or supervision    Standing Unsupported, One Foot in Front Able to take small step independently and hold 30 seconds    Standing on One Leg Tries to lift leg/unable to hold 3 seconds but remains standing independently    Total Score 37              Patient is a 76 year old female who presents for LE weakness and balance deficits. PMH includes anxiety, arthritis, CKD, colon polyps, COPD, DM, GERD, HOH, Kidney stones, osteoporosis, sleep  apnea, venous stasis. Lives alone in [place], [place]. She was originally from [place] but more recently from [place]. 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 She has three adult children who are healthy. Patient falls in the dark; had a fall at the  beach last November as well. Patient has dizziness when driving on a bumpy rode. Limited in walking distance.     PAIN: Shooting pains throughout body  Gets back pain with washing dishes   POSTURE: Slight weight shift onto LLE      STRENGTH:  Graded on a 0-5 scale Muscle Group Left Right  Hip Flex 3+/5 3/5  Hip Abd 4-/5 3/5  Hip Add 3+/5 3+/5  Hip Ext /5 /5  Hip IR/ER /5 /5  Knee Flex 4/5 4/5  Knee Ext 4/5 4/5  Ankle DF 4/5 4-/5  Ankle PF 4/5 3+/5   SENSATION:  BUE :  BLE :   NEUROLOGICAL SCREEN: (2+ unless otherwise noted.) N=normal  Ab=abnormal   Level Dermatome R L  L2 Medial thigh/groin ab N  L3 Lower thigh/med.knee ab N  L4 Medial leg/lat thigh ab N  L5 Lat. leg & dorsal foot ab N  S1 post/lat foot/thigh/leg ab N  S2 Post./med. thigh & leg ab N    SOMATOSENSORY:  Any N & T in extremities or weakness: reports :         Sensation           Intact      Diminished         Absent  Light touch LEs RLE                             COORDINATION: Finger to Nose: WFL bilaterally          Heel Shin Slide Test: unable to perform with RLE; slow with LLE    SPECIAL TESTS: Cranial Nerves/Vision  Visual scanning WFL: some dizziness reported  FUNCTIONAL MOBILITY:  STS: requires hands on knees   BALANCE: Static Sitting Balance  Normal Able to maintain balance against maximal resistance   Good Able to maintain balance against moderate resistance   Good-/Fair+ Accepts minimal resistance x  Fair Able to sit unsupported without balance loss and without UE support   Poor+ Able to maintain with Minimal assistance from individual or chair   Poor Unable to maintain balance-requires mod/max support from individual or  chair    Static Standing Balance  Normal Able to maintain standing balance against maximal resistance   Good Able to maintain standing balance against moderate resistance   Good-/Fair+ Able to maintain standing balance against minimal resistance   Fair Able to stand unsupported without UE support and without LOB for 1-2 min x  Fair- Requires Min A and UE support to maintain standing without loss of balance   Poor+ Requires mod A and UE support to maintain standing without loss of balance   Poor Requires max A and UE support to maintain standing balance without loss    Dynamic Sitting Balance  Normal Able to sit unsupported and weight shift across midline maximally   Good Able to sit unsupported and weight shift across midline moderately   Good-/Fair+ Able to sit unsupported and weight shift across midline minimally x  Fair Minimal weight shifting ipsilateral/front, difficulty crossing midline   Fair- Reach to ipsilateral side and unable to weight shift   Poor + Able to sit unsupported with min A and reach to ipsilateral side, unable to weight shift   Poor Able to sit unsupported with mod A and reach ipsilateral/front-cant cross midline    Standing Dynamic Balance  Normal Stand independently unsupported, able to weight shift and cross midline maximally   Good  Stand independently unsupported, able to weight shift and cross midline moderately   Good-/Fair+ Stand independently unsupported, able to weight shift across midline minimally   Fair Stand independently unsupported, weight shift, and reach ipsilaterally, loss of balance when crossing midline x  Poor+ Able to stand with Min A and reach ipsilaterally, unable to weight shift   Poor Able to stand with Mod A and minimally reach ipsilaterally, unable to cross midline.      GAIT: Patient ambulates without an AD, has intermittent stumbles.   OUTCOME MEASURES: TEST Outcome Interpretation  5 times sit<>stand 18.24 sec; hands on knees  >60 yo, >15 sec indicates increased risk for falls  Berg Balance Assessment 37/56  <36/56 (100% risk for falls), 37-45 (80% risk for falls); 46-51 (>50% risk for falls); 52-55 (lower risk <25% of falls)  FOTO 46 51     Access Code: [email or web address] URL: [email or web address] Date: 10/25/2021 Prepared by: Janna Arch  Exercises Seated Hip Abduction with Resistance - 1 x daily - 7 x weekly - 2 sets - 10 reps - 5 hold Seated Hip Adduction Isometrics with Ball - 1 x daily - 7 x weekly - 2 sets - 10 reps - 5 hold Seated Knee Lifts with Resistance - 1 x daily - 7 x weekly - 2 sets - 10 reps - 5 hold Standing Tandem Balance with Counter Support - 1 x daily - 7 x weekly - 2 sets - 1 reps - 30 hold Standing March with Counter Support - 1 x daily - 7 x weekly - 2 sets - 10 reps - 5 hold         Objective measurements completed on examination: See above findings.              PT Education - 10/25/21 1314     Education Details goals, POC, HEP    Person(s) Educated Patient    Methods Explanation;Demonstration;Tactile cues;Verbal cues;Handout    Comprehension Returned demonstration;Verbal cues required;Tactile cues required;Verbalized understanding              PT Short Term Goals - 10/25/21 1318       PT SHORT TERM GOAL #1   Title Patient will be independent in home exercise program to improve strength/mobility for better functional independence with ADLs.    Baseline 2/28: HEP given    Time 4    Period Weeks    Status New    Target Date 11/22/21      PT SHORT TERM GOAL #2   Title Patient (> 75 years old) will complete five times sit to stand test in < 15 seconds indicating an increased LE strength and improved balance.    Baseline 2/28: 18.24 seconds hands on knees    Time 4    Period Weeks    Status New    Target Date 11/22/21               PT Long Term Goals - 10/25/21 1319       PT LONG TERM GOAL #1   Title Patient will increase FOTO score  to equal to or greater than  51%   to demonstrate statistically significant improvement in mobility and quality of life    Baseline 2/28; 46%    Time 12    Period Weeks    Status New    Target Date 01/17/22      PT LONG TERM GOAL #2   Title Patient will increase Berg Balance score by > 6  points (43/56)  to demonstrate decreased fall risk during functional activities.    Baseline 2/28: 37/56    Time 12    Period Weeks    Status New    Target Date 01/17/22      PT LONG TERM GOAL #3   Title Patient will increase six minute walk test distance to >1300 for progression to community age norm ambulator and improve gait ability    Baseline perform next session    Time 12    Period Weeks    Status New    Target Date 01/17/22      PT LONG TERM GOAL #4   Title Patient will increase BLE gross strength to 4+/5 as to improve functional strength for independent gait, increased standing tolerance and increased ADL ability.    Baseline 2/28: see note    Time 12    Period Weeks    Status New    Target Date 01/17/22                    Plan - 10/25/21 1315     Clinical Impression Statement Patient is a pleasant 76 year old female who presents with generalized LE weakness (R>L) and instability. Patient is very hard of hearing. Patient is s/p hip surgery last year and has not fully regained her strength. She additionally has increased LOB and occasional dizziness. Patient does have history of vestibular involvement and may potentially benefit from vestibular screening to determine if it is affecting her stability. Patient is highly motivated as she lives alone. Her ambulation is limited and she would benefit from a 6 minute walk test next session. She additionally does have significant swelling of limbs but reports she will see cardiologist soon. She is educated on strengthening and stabilization interventions for home program. Patient will benefit from skilled physical therapy to improve her  strength, stability and mobility.    Personal Factors and Comorbidities Age;Comorbidity 3+;Fitness;Past/Current Experience;Social Background;Time since onset of injury/illness/exacerbation;Transportation    Comorbidities anxiety, arthritis, CKD, colon polyps, COPD, DM, GERD, HOH, Kidney stones, osteoporosis, sleep apnea, venous stasis    Examination-Activity Limitations Bathing;Bed Mobility;Bend;Caring for Others;Carry;Locomotion Level;Lift;Hygiene/Grooming;Dressing;Reach Overhead;Sleep;Squat;Transfers;Toileting;Stand;Stairs    Examination-Participation Restrictions Church;Cleaning;Community Activity;Driving;Meal Prep;Laundry;Interpersonal Relationship;Personal Finances;Shop;Volunteer;Yard Work    [employer] Evolving/Moderate complexity    Clinical Decision Making Moderate    Rehab Potential Fair    PT Frequency 2x / week    PT Duration 12 weeks    PT Treatment/Interventions ADLs/Self Care Home Management;Cryotherapy;Electrical Stimulation;Canalith Repostioning;Moist Heat;Iontophoresis 4mg /ml Dexamethasone;Traction;DME Instruction;Gait training;Stair training;Functional mobility training;Therapeutic activities;Therapeutic exercise;Neuromuscular re-education;Balance training;Patient/family education;Manual techniques;Passive range of motion;Dry needling;Vestibular;Taping;Splinting;Energy conservation;Visual/perceptual remediation/compensation    PT Next Visit Plan vestibular screen, 6 minute walk test    PT Home Exercise Plan see above    Consulted and Agree with Plan of Care Patient             Patient will benefit from skilled therapeutic intervention in order to improve the following deficits and impairments:  Abnormal gait, Cardiopulmonary status limiting activity, Decreased activity tolerance, Decreased balance, Decreased endurance, Decreased knowledge of use of DME, Decreased safety awareness, Decreased mobility, Decreased strength, Difficulty walking, Impaired  flexibility, Impaired perceived functional ability, Impaired sensation, Postural dysfunction, Improper body mechanics, Pain  Visit Diagnosis: Muscle weakness (generalized)  Unsteadiness on feet  Other abnormalities of gait and mobility     Problem List Patient Active Problem List   Diagnosis Date Noted   Hypokalemia 09/12/2020   Chronic venous stasis 01/11/2020   Atopic  dermatitis 69/50/7225   Diastolic dysfunction 75/12/1831   Peripheral edema 09/10/2019   Dyspnea on exertion 09/10/2019   Muscle cramps 09/10/2019   Type 2 diabetes mellitus with hyperglycemia ([place]) 05/18/2019   Seasonal allergic rhinitis 05/18/2019   Screening for breast cancer 05/18/2019   Stenosis of left carotid artery 02/05/2019   Acute upper respiratory infection 10/09/2018   Flu-like symptoms 10/09/2018   Sore throat 10/09/2018   Vitamin D deficiency 07/27/2018   Need for vaccination against Streptococcus pneumoniae using pneumococcal conjugate vaccine 7 07/27/2018   Encounter for general adult medical examination with abnormal findings 04/07/2018   Primary generalized (osteo)arthritis 04/07/2018   Hematuria, microscopic 58/25/1898   Renal colic 42/05/3127   Urinary tract infection without hematuria 11/21/2017   Dysuria 11/21/2017   Uncontrolled type 2 diabetes mellitus with hyperglycemia ([place]) 11/21/2017   Diabetes mellitus type 2, uncomplicated ([place]) 11/88/6773   Osteoporosis 10/04/2017   S/P total hip arthroplasty 06/14/2016   Inguinal hernia, left 73/66/8159   Umbilical hernia 47/02/6150   Increased frequency of urination 11/27/2012   Chronic cystitis 11/19/2012   Incomplete emptying of bladder 11/19/2012   Medullary sponge kidney 11/19/2012   Mixed urge and stress incontinence 11/19/2012   Anxiety 01/02/2012   Calculus of kidney 01/02/2012   Chronic obstructive pulmonary disease ([place]) 01/02/2012   GERD (gastroesophageal reflux disease) 01/02/2012   Hearing loss 01/02/2012   Mixed  hyperlipidemia 01/02/2012   Obesity, unspecified 01/02/2012   OSA on CPAP 01/02/2012   Tobacco abuse 01/02/2012   Venous insufficiency 01/02/2012   Janna Arch, PT, DPT  10/25/2021, 1:22 PM  [place] [facility] MAIN [facility] SERVICES [address] [place], [place], [address] Phone: [phone]   Fax:  [phone]  Name: Amber Holder MRN: [id number] Date of Birth: 13-Oct-1945

## 2021-10-25 NOTE — Patient Instructions (Signed)
°  Access Code: [email or web address] URL: [email or web address] Date: 10/25/2021 Prepared by: Janna Arch  Exercises Seated Hip Abduction with Resistance - 1 x daily - 7 x weekly - 2 sets - 10 reps - 5 hold Seated Hip Adduction Isometrics with Ball - 1 x daily - 7 x weekly - 2 sets - 10 reps - 5 hold Seated Knee Lifts with Resistance - 1 x daily - 7 x weekly - 2 sets - 10 reps - 5 hold Standing Tandem Balance with Counter Support - 1 x daily - 7 x weekly - 2 sets - 1 reps - 30 hold Standing March with Counter Support - 1 x daily - 7 x weekly - 2 sets - 10 reps - 5 hold

## 2021-11-01 ENCOUNTER — Other Ambulatory Visit: Payer: Self-pay

## 2021-11-01 ENCOUNTER — Ambulatory Visit: Payer: Medicare Other | Attending: Physician Assistant

## 2021-11-01 DIAGNOSIS — R2689 Other abnormalities of gait and mobility: Secondary | ICD-10-CM | POA: Diagnosis not present

## 2021-11-01 DIAGNOSIS — M6281 Muscle weakness (generalized): Secondary | ICD-10-CM | POA: Diagnosis not present

## 2021-11-01 DIAGNOSIS — R2681 Unsteadiness on feet: Secondary | ICD-10-CM | POA: Insufficient documentation

## 2021-11-01 DIAGNOSIS — E1165 Type 2 diabetes mellitus with hyperglycemia: Secondary | ICD-10-CM | POA: Diagnosis not present

## 2021-11-01 DIAGNOSIS — R269 Unspecified abnormalities of gait and mobility: Secondary | ICD-10-CM | POA: Diagnosis not present

## 2021-11-01 DIAGNOSIS — R262 Difficulty in walking, not elsewhere classified: Secondary | ICD-10-CM | POA: Diagnosis not present

## 2021-11-01 DIAGNOSIS — S63681A Other sprain of right thumb, initial encounter: Secondary | ICD-10-CM | POA: Diagnosis not present

## 2021-11-01 DIAGNOSIS — M19041 Primary osteoarthritis, right hand: Secondary | ICD-10-CM | POA: Diagnosis not present

## 2021-11-01 DIAGNOSIS — M25541 Pain in joints of right hand: Secondary | ICD-10-CM | POA: Diagnosis not present

## 2021-11-01 NOTE — Therapy (Signed)
[place] MAIN [facility] SERVICES [address] [place], [place], [address] Phone: [phone]   Fax:  [phone]  Physical Therapy Treatment  Patient Details  Name: Amber Holder MRN: [id number] Date of Birth: 02-23-1946 Referring Provider (PT): Drema Dallas PA   Encounter Date: 11/01/2021   PT End of Session - 11/01/21 1122     Visit Number 2    Number of Visits 24    Date for PT Re-Evaluation 01/17/22    Authorization Type 1/10 eval 2/28    PT Start Time 1300    PT Stop Time 1342    PT Time Calculation (min) 42 min    Equipment Utilized During Treatment Gait belt    Activity Tolerance Patient tolerated treatment well    Behavior During Therapy [facility] for tasks assessed/performed             Past Medical History:  Diagnosis Date   Anxiety 01/02/2012   Arthritis    Asthma    Chronic kidney disease    Kidney stones   Colon polyps    COPD (chronic obstructive pulmonary disease) ([place])    Diabetes mellitus without complication (HCC)    GERD (gastroesophageal reflux disease)    HOH (hard of hearing)    Bilateral hearing aids   Kidney stone    Osteoporosis    Sleep apnea    Does not use C-PAP on a regular basis   Venous stasis     Past Surgical History:  Procedure Laterality Date   APPENDECTOMY     cataract surgery Bilateral    CHOLECYSTECTOMY     COLONOSCOPY     COLONOSCOPY WITH PROPOFOL N/A 05/28/2015   Procedure: COLONOSCOPY WITH PROPOFOL;  Surgeon: Lollie Sails, MD;  Location: [facility] ENDOSCOPY;  Service: Endoscopy;  Laterality: N/A;   COLONOSCOPY WITH PROPOFOL N/A 10/28/2018   Procedure: COLONOSCOPY WITH PROPOFOL;  Surgeon: Lollie Sails, MD;  Location: [facility] ENDOSCOPY;  Service: Endoscopy;  Laterality: N/A;   EYE SURGERY Bilateral 2015   Cataract Extraction with IOL   LITHOTRIPSY  2015   has had many stones   Mastoidotomy  1970   ROTATOR CUFF REPAIR Right 2011   TOTAL HIP ARTHROPLASTY Right 06/14/2016    Procedure: TOTAL HIP ARTHROPLASTY;  Surgeon: Dereck Leep, MD;  Location: [facility] ORS;  Service: Orthopedics;  Laterality: Right;    There were no vitals filed for this visit.   Subjective Assessment - 11/01/21 1305     Subjective Patient reports feeling a little sluggish and some soreness in right thigh from doing her exercises.    Pertinent History Patient is a 76 year old female who presents for LE weakness and balance deficits. PMH includes anxiety, arthritis, CKD, colon polyps, COPD, DM, GERD, HOH, Kidney stones, osteoporosis, sleep apnea, venous stasis. Lives alone in [place], [place]. She was originally from [place] but more recently from [place]. 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 She has three adult children who are healthy. Patient falls in the dark; had a fall at the beach last November as well. Patient has dizziness when driving on a bumpy rode. Limited in walking distance.    Limitations Lifting;Standing;Walking;House hold activities    How long can you stand comfortably? has difficulty washing dishes    How long can you walk comfortably? to mailbox    Patient Stated Goals to be able to walk; to improve balance    Currently in Pain? Yes    Pain Score 3  Pain Location Leg    Pain Orientation Right;Anterior    Pain Descriptors / Indicators Sore    Pain Type Acute pain    Pain Onset 1 to 4 weeks ago    Pain Frequency Intermittent    Aggravating Factors  sore from exercises               INTERVENTIONS:   Therex:   Standing LE strengthening: Instructed to perform these exercises at kitchen counter at home. Patient performed 2 sets of 10-12 reps- extensive VC for technique and to hold x 2 sec  Hip march Hip ext Hip abd Knee flex Sit to stand  Heel raises Toe raises   Education provided throughout session via VC/TC and demonstration to facilitate movement at target joints and correct muscle activation for all testing and exercises performed.     Access Code: [email or web address] URL: [email or web address] Date: 11/01/2021 Prepared by: Sande Brothers  Exercises Standing Hip Flexion March - 1 x daily - 3 x weekly - 3 sets - 10 reps Standing Hip Abduction with Counter Support - 1 x daily - 3 x weekly - 3 sets - 10 reps Standing Hip Extension with Counter Support - 1 x daily - 3 x weekly - 3 sets - 10 reps Sit to Stand with Arms Crossed - 1 x daily - 3 x weekly - 3 sets - 10 reps Standing Knee Flexion with Counter Support - 1 x daily - 3 x weekly - 3 sets - 10 reps Standing Heel Raises - 1 x daily - 3 x weekly - 3 sets - 10 reps                       PT Education - 11/02/21 1121     Education Details Exercise technique and HEP    Person(s) Educated Patient    Methods Explanation;Demonstration;Tactile cues;Verbal cues    Comprehension Verbalized understanding;Returned demonstration;Verbal cues required;Tactile cues required;Need further instruction              PT Short Term Goals - 10/25/21 1318       PT SHORT TERM GOAL #1   Title Patient will be independent in home exercise program to improve strength/mobility for better functional independence with ADLs.    Baseline 2/28: HEP given    Time 4    Period Weeks    Status New    Target Date 11/22/21      PT SHORT TERM GOAL #2   Title Patient (> 28 years old) will complete five times sit to stand test in < 15 seconds indicating an increased LE strength and improved balance.    Baseline 2/28: 18.24 seconds hands on knees    Time 4    Period Weeks    Status New    Target Date 11/22/21               PT Long Term Goals - 10/25/21 1319       PT LONG TERM GOAL #1   Title Patient will increase FOTO score to equal to or greater than  51%   to demonstrate statistically significant improvement in mobility and quality of life    Baseline 2/28; 46%    Time 12    Period Weeks    Status New    Target Date 01/17/22      PT LONG TERM  GOAL #2   Title Patient will increase Berg Balance score by > 6 points (43/56)  to  demonstrate decreased fall risk during functional activities.    Baseline 2/28: 37/56    Time 12    Period Weeks    Status New    Target Date 01/17/22      PT LONG TERM GOAL #3   Title Patient will increase six minute walk test distance to >1300 for progression to community age norm ambulator and improve gait ability    Baseline perform next session    Time 12    Period Weeks    Status New    Target Date 01/17/22      PT LONG TERM GOAL #4   Title Patient will increase BLE gross strength to 4+/5 as to improve functional strength for independent gait, increased standing tolerance and increased ADL ability.    Baseline 2/28: see note    Time 12    Period Weeks    Status New    Target Date 01/17/22                   Plan - 11/01/21 1122     Clinical Impression Statement Patient presented with good motivation and participated well with additional inclusion of progressive LE strengthening exercises. She was able to follow VC and return demonstration well without complaints of pain. She only endorsed fatigue as limiting factor. Patient will benefit from skilled physical therapy to improve her strength, stability and mobility    Personal Factors and Comorbidities Age;Comorbidity 3+;Fitness;Past/Current Experience;Social Background;Time since onset of injury/illness/exacerbation;Transportation    Comorbidities anxiety, arthritis, CKD, colon polyps, COPD, DM, GERD, HOH, Kidney stones, osteoporosis, sleep apnea, venous stasis    Examination-Activity Limitations Bathing;Bed Mobility;Bend;Caring for Others;Carry;Locomotion Level;Lift;Hygiene/Grooming;Dressing;Reach Overhead;Sleep;Squat;Transfers;Toileting;Stand;Stairs    Examination-Participation Restrictions Church;Cleaning;Community Activity;Driving;Meal Prep;Laundry;Interpersonal Relationship;Personal Finances;Shop;Volunteer;Yard Work     Merchant [employer] officer Evolving/Moderate complexity    Rehab Potential Fair    PT Frequency 2x / week    PT Duration 12 weeks    PT Treatment/Interventions ADLs/Self Care Home Management;Cryotherapy;Electrical Stimulation;Canalith Repostioning;Moist Heat;Iontophoresis '4mg'$ /ml Dexamethasone;Traction;DME Instruction;Gait training;Stair training;Functional mobility training;Therapeutic activities;Therapeutic exercise;Neuromuscular re-education;Balance training;Patient/family education;Manual techniques;Passive range of motion;Dry needling;Vestibular;Taping;Splinting;Energy conservation;Visual/perceptual remediation/compensation    PT Next Visit Plan vestibular screen, 6 minute walk test, Progress LE strengthening and implement balance activities    PT Home Exercise Plan 11/01/2021=Access Code: [email or web address]  URL: [email or web address]    Consulted and Agree with Plan of Care Patient             Patient will benefit from skilled therapeutic intervention in order to improve the following deficits and impairments:  Abnormal gait, Cardiopulmonary status limiting activity, Decreased activity tolerance, Decreased balance, Decreased endurance, Decreased knowledge of use of DME, Decreased safety awareness, Decreased mobility, Decreased strength, Difficulty walking, Impaired flexibility, Impaired perceived functional ability, Impaired sensation, Postural dysfunction, Improper body mechanics, Pain  Visit Diagnosis: Abnormality of gait and mobility  Muscle weakness (generalized)  Difficulty in walking, not elsewhere classified  Unsteadiness on feet     Problem List Patient Active Problem List   Diagnosis Date Noted   Hypokalemia 09/12/2020   Chronic venous stasis 01/11/2020   Atopic dermatitis 25/42/7062   Diastolic dysfunction 37/62/8315   Peripheral edema 09/10/2019   Dyspnea on exertion 09/10/2019   Muscle cramps 09/10/2019   Type 2 diabetes mellitus with hyperglycemia  ([place]) 05/18/2019   Seasonal allergic rhinitis 05/18/2019   Screening for breast cancer 05/18/2019   Stenosis of left carotid artery 02/05/2019   Acute upper respiratory infection 10/09/2018   Flu-like symptoms 10/09/2018   Sore throat 10/09/2018   Vitamin D  deficiency 07/27/2018   Need for vaccination against Streptococcus pneumoniae using pneumococcal conjugate vaccine 7 07/27/2018   Encounter for general adult medical examination with abnormal findings 04/07/2018   Primary generalized (osteo)arthritis 04/07/2018   Hematuria, microscopic 58/85/0277   Renal colic 41/28/7867   Urinary tract infection without hematuria 11/21/2017   Dysuria 11/21/2017   Uncontrolled type 2 diabetes mellitus with hyperglycemia ([place]) 11/21/2017   Diabetes mellitus type 2, uncomplicated ([place]) 67/20/9470   Osteoporosis 10/04/2017   S/P total hip arthroplasty 06/14/2016   Inguinal hernia, left 96/28/3662   Umbilical hernia 94/76/5465   Increased frequency of urination 11/27/2012   Chronic cystitis 11/19/2012   Incomplete emptying of bladder 11/19/2012   Medullary sponge kidney 11/19/2012   Mixed urge and stress incontinence 11/19/2012   Anxiety 01/02/2012   Calculus of kidney 01/02/2012   Chronic obstructive pulmonary disease ([place]) 01/02/2012   GERD (gastroesophageal reflux disease) 01/02/2012   Hearing loss 01/02/2012   Mixed hyperlipidemia 01/02/2012   Obesity, unspecified 01/02/2012   OSA on CPAP 01/02/2012   Tobacco abuse 01/02/2012   Venous insufficiency 01/02/2012    Lewis Moccasin, PT 11/02/2021, 11:39 AM  [place] MAIN [facility] SERVICES [address] [place], [place], [address] Phone: [phone]   Fax:  [phone]  Name: Amber Holder MRN: [id number] Date of Birth: 10/27/1945

## 2021-11-03 ENCOUNTER — Encounter: Payer: Self-pay | Admitting: Cardiology

## 2021-11-03 ENCOUNTER — Ambulatory Visit (INDEPENDENT_AMBULATORY_CARE_PROVIDER_SITE_OTHER): Payer: Medicare Other | Admitting: Cardiology

## 2021-11-03 ENCOUNTER — Other Ambulatory Visit: Payer: Self-pay

## 2021-11-03 VITALS — BP 126/66 | HR 77 | Ht 66.0 in | Wt 196.0 lb

## 2021-11-03 DIAGNOSIS — M79605 Pain in left leg: Secondary | ICD-10-CM

## 2021-11-03 DIAGNOSIS — I2510 Atherosclerotic heart disease of native coronary artery without angina pectoris: Secondary | ICD-10-CM

## 2021-11-03 DIAGNOSIS — F17200 Nicotine dependence, unspecified, uncomplicated: Secondary | ICD-10-CM | POA: Diagnosis not present

## 2021-11-03 DIAGNOSIS — E7800 Pure hypercholesterolemia, unspecified: Secondary | ICD-10-CM

## 2021-11-03 DIAGNOSIS — M79604 Pain in right leg: Secondary | ICD-10-CM | POA: Diagnosis not present

## 2021-11-03 DIAGNOSIS — R600 Localized edema: Secondary | ICD-10-CM | POA: Diagnosis not present

## 2021-11-03 DIAGNOSIS — R0609 Other forms of dyspnea: Secondary | ICD-10-CM

## 2021-11-03 DIAGNOSIS — I6522 Occlusion and stenosis of left carotid artery: Secondary | ICD-10-CM | POA: Diagnosis not present

## 2021-11-03 MED ORDER — TORSEMIDE 20 MG PO TABS: 20.0000 mg | ORAL_TABLET | Freq: Two times a day (BID) | ORAL | 3 refills | Status: DC

## 2021-11-03 MED ORDER — METOPROLOL TARTRATE 100 MG PO TABS: 100.0000 mg | ORAL_TABLET | Freq: Once | ORAL | 0 refills | Status: DC

## 2021-11-03 MED ORDER — PREDNISONE 50 MG PO TABS: ORAL_TABLET | ORAL | 0 refills | Status: DC

## 2021-11-03 MED ORDER — ASPIRIN EC 81 MG PO TBEC: 81.0000 mg | DELAYED_RELEASE_TABLET | Freq: Every day | ORAL | 3 refills | Status: AC

## 2021-11-03 MED ORDER — DIPHENHYDRAMINE HCL 50 MG PO CAPS: ORAL_CAPSULE | ORAL | 0 refills | Status: DC

## 2021-11-03 MED ORDER — ATORVASTATIN CALCIUM 40 MG PO TABS: 40.0000 mg | ORAL_TABLET | Freq: Every day | ORAL | 3 refills | Status: DC

## 2021-11-03 NOTE — Progress Notes (Signed)
Cardiology Office Note:    Date:  11/03/2021   ID:  Amber Holder, DOB 01/05/1946, MRN [id number]  PCP:  Lyndon Code, MD   [facility] Providers Cardiologist:  Debbe Odea, MD     Referring MD: Carlean Jews, PA*   Chief Complaint  Patient presents with   New Patient (Initial Visit)    Referred by PCP for Swelling and SOB. Patient states PCP increased lasix to 30MG  daily. She had not seen a chance in swelling or urinating. Meds reviewed verbally with patient.     History of Present Illness:    Amber Holder is a 76 y.o. female with a hx of hyperlipidemia, diabetes, COPD, current smoker x40+ years, PAD/carotid stenosis who presents due to shortness of breath and leg edema.  Patient states having a history of venous insufficiency, lower extremity edema for years now.  Uses compression stockings to help with symptoms.  Has shortness of breath with minimal exertion ongoing for some time now, attributed to COPD.  She still smokes, states having lower extremity weakness in the thighs with walking.  Takes Lipitor every other day due to fear of taking medicines daily.  Started on Lasix by primary care provider, recently increased to 30 mg daily, patient states Lasix is not working.  Outside echocardiogram 2021 showed normal systolic function, diastolic dysfunction EF 60%  Chest CT 06/2016 showed coronary artery calcification  Past Medical History:  Diagnosis Date   Anxiety 01/02/2012   Arthritis    Asthma    Chronic kidney disease    Kidney stones   Colon polyps    COPD (chronic obstructive pulmonary disease) (HCC)    Diabetes mellitus without complication (HCC)    GERD (gastroesophageal reflux disease)    HOH (hard of hearing)    Bilateral hearing aids   Kidney stone    Osteoporosis    Sleep apnea    Does not use C-PAP on a regular basis   Venous stasis     Past Surgical History:  Procedure Laterality Date   APPENDECTOMY     cataract surgery Bilateral     CHOLECYSTECTOMY     COLONOSCOPY     COLONOSCOPY WITH PROPOFOL N/A 05/28/2015   Procedure: COLONOSCOPY WITH PROPOFOL;  Surgeon: Christena Deem, MD;  Location: [facility] ENDOSCOPY;  Service: Endoscopy;  Laterality: N/A;   COLONOSCOPY WITH PROPOFOL N/A 10/28/2018   Procedure: COLONOSCOPY WITH PROPOFOL;  Surgeon: Christena Deem, MD;  Location: [facility] ENDOSCOPY;  Service: Endoscopy;  Laterality: N/A;   EYE SURGERY Bilateral 2015   Cataract Extraction with IOL   LITHOTRIPSY  2015   has had many stones   Mastoidotomy  1970   ROTATOR CUFF REPAIR Right 2011   TOTAL HIP ARTHROPLASTY Right 06/14/2016   Procedure: TOTAL HIP ARTHROPLASTY;  Surgeon: Donato Heinz, MD;  Location: [facility] ORS;  Service: Orthopedics;  Laterality: Right;    Current Medications: Current Meds  Medication Sig   acetaminophen (TYLENOL) 500 MG chewable tablet Chew 1,000 mg by mouth every 8 (eight) hours as needed for pain.   albuterol (VENTOLIN HFA) 108 (90 Base) MCG/ACT inhaler Inhale 2 puffs into the lungs every 6 (six) hours as needed for wheezing or shortness of breath.   aspirin EC 81 MG tablet Take 1 tablet (81 mg total) by mouth daily. Swallow whole.   atorvastatin (LIPITOR) 40 MG tablet Take 1 tablet (40 mg total) by mouth daily.   cholecalciferol (VITAMIN D) 400 UNITS TABS tablet Take 2,000 Units by mouth  daily.    CRANBERRY PO Take 25,000 mg by mouth daily.   diphenhydrAMINE (BENADRYL) 50 MG capsule Take 1 capsule 1 hour prior to procedure   ergocalciferol (DRISDOL) 1.25 MG (50000 UT) capsule Take one cap q week   fluticasone (FLOVENT HFA) 110 MCG/ACT inhaler Inhale 2 puffs into the lungs 2 (two) times daily. Use 2 puffs bid for congestion   glucose blood (ONETOUCH VERIO) test strip BLOOD SUGAR TESTING ONCE DAILY . DX E11.65   hydrocortisone cream 0.5 % Apply topically as needed.    ibandronate (BONIVA) 150 MG tablet Take 1 tablet (150 mg total) by mouth every 30 (thirty) days.   Lancets (ONETOUCH DELICA PLUS LANCET30G)  MISC Use  as directed twice a daily DX E11.65   Magnesium 250 MG TABS Take by mouth daily.   metoprolol tartrate (LOPRESSOR) 100 MG tablet Take 1 tablet (100 mg total) by mouth once for 1 dose. Take two hours prior to your CT.   montelukast (SINGULAIR) 10 MG tablet TAKE 1 TABLET BY MOUTH DAILY FOR ALLERGIES   omeprazole (PRILOSEC) 40 MG capsule TAKE 1 CAPSULE (40 MG TOTAL) BY MOUTH DAILY.   potassium chloride (KLOR-CON) 8 MEQ tablet Take 1 tablet (8 mEq total) by mouth as needed (only take when taking Lasix).   predniSONE (DELTASONE) 50 MG tablet Take 50 mg 13 hours, 7 hour, and 1 hour prior to procedure   SPIRIVA HANDIHALER 18 MCG inhalation capsule PLACE 1 CAPSULE (18 MCG TOTAL) INTO INHALER AND INHALE DAILY. FOR COPD *DO NOT SWALLOW   torsemide (DEMADEX) 20 MG tablet Take 1 tablet (20 mg total) by mouth 2 (two) times daily.   [DISCONTINUED] atorvastatin (LIPITOR) 10 MG tablet Take one tab every other day for high cholesterol   [DISCONTINUED] furosemide (LASIX) 20 MG tablet Take 1.5 tablets (30 mg total) by mouth daily.     Allergies:   Shellfish allergy, Sulfa antibiotics, Sulfasalazine, Percocet [oxycodone-acetaminophen], Iodinated contrast media, and Iodine   Social History   Socioeconomic History   Marital status: Divorced    Spouse name: Not on file   Number of children: Not on file   Years of education: Not on file   Highest education level: Not on file  Occupational History   Not on file  Tobacco Use   Smoking status: Every Day    Packs/day: 1.00    Types: Cigarettes   Smokeless tobacco: Never   Tobacco comments:    1 pack daily  Vaping Use   Vaping Use: Never used  Substance and Sexual Activity   Alcohol use: Yes    Comment: very rarely    Drug use: No   Sexual activity: Not Currently  Other Topics Concern   Not on file  Social History Narrative   Not on file   Social Determinants of Health   Financial Resource Strain: Not on file  Food Insecurity: Not on file   Transportation Needs: Not on file  Physical Activity: Not on file  Stress: Not on file  Social Connections: Not on file     Family History: The patient's family history includes Breast cancer (age of onset: 10) in her maternal aunt. There is no history of Bladder Cancer or Kidney cancer.  ROS:   Please see the history of present illness.     All other systems reviewed and are negative.  EKGs/Labs/Other Studies Reviewed:    The following studies were reviewed today:   EKG:  EKG is  ordered today.  The ekg  ordered today demonstrates normal sinus rhythm  Recent Labs: 12/24/2020: Hemoglobin 16.1; Platelets 238; TSH 1.770 06/13/2021: ALT 16; BUN 17; Creatinine, Ser 0.63; Potassium 4.8; Sodium 141  Recent Lipid Panel    Component Value Date/Time   CHOL 239 (H) 12/24/2020 1154   TRIG 92 12/24/2020 1154   HDL 55 12/24/2020 1154   LDLCALC 168 (H) 12/24/2020 1154     Risk Assessment/Calculations:          Physical Exam:    VS:  BP 126/66 (BP Location: Left Arm, Patient Position: Sitting, Cuff Size: Large)   Pulse 77   Ht 5\' 6"  (1.676 m)   Wt 196 lb (88.9 kg)   SpO2 95%   BMI 31.64 kg/m     Wt Readings from Last 3 Encounters:  11/03/21 196 lb (88.9 kg)  10/12/21 195 lb 12.8 oz (88.8 kg)  10/03/21 195 lb 9.6 oz (88.7 kg)     GEN:  Well nourished, well developed in no acute distress HEENT: Normal NECK: No JVD; No carotid bruits CARDIAC: RRR, no murmurs, rubs, gallops RESPIRATORY: Decreased breath sounds, no wheezing. ABDOMEN: Soft, non-tender, non-distended MUSCULOSKELETAL:  1+ edema; No deformity  SKIN: Warm and dry NEUROLOGIC:  Alert and oriented x 3 PSYCHIATRIC:  Normal affect   ASSESSMENT:    1. Coronary artery disease involving native coronary artery of native heart, unspecified whether angina present   2. Dyspnea on exertion   3. Pure hypercholesterolemia   4. Smoking   5. Stenosis of left carotid artery   6. Pain in both lower extremities   7. Leg  edema    PLAN:    In order of problems listed above:  CAD, coronary artery calcification on chest CT.  Start aspirin 81 mg daily, Lipitor 40 mg daily.  Get echocardiogram. Dyspnea on exertion, get coronary CTA to evaluate significant CAD.  If no significant stenosis, COPD, deconditioning likely contributing. Hyperlipidemia, start Lipitor 40 mg daily. Current smoker, cessation recommended. Left carotid artery stenosis, start aspirin 81, Lipitor 40 mg daily as above.  Referred to vascular surgery.  Patient also has venous insufficiency. Thigh and leg weakness with walking bilaterally.  This could signify claudication.  Get lower extremity arterial ultrasound.  Keep appointment with vascular surgery. Leg edema bilaterally, this could be secondary to venous insufficiency, start torsemide 20 mg daily.  Check BMP in 1 week.  Follow-up after echo, coronary CTA.       Medication Adjustments/Labs and Tests Ordered: Current medicines are reviewed at length with the patient today.  Concerns regarding medicines are outlined above.  Orders Placed This Encounter  Procedures   CT CORONARY MORPH W/CTA COR W/SCORE W/CA W/CM &/OR WO/CM   Basic Metabolic Panel (BMET)   Ambulatory referral to Vascular Surgery   ECHOCARDIOGRAM COMPLETE   Meds ordered this encounter  Medications   torsemide (DEMADEX) 20 MG tablet    Sig: Take 1 tablet (20 mg total) by mouth 2 (two) times daily.    Dispense:  180 tablet    Refill:  3   atorvastatin (LIPITOR) 40 MG tablet    Sig: Take 1 tablet (40 mg total) by mouth daily.    Dispense:  90 tablet    Refill:  3   aspirin EC 81 MG tablet    Sig: Take 1 tablet (81 mg total) by mouth daily. Swallow whole.    Dispense:  90 tablet    Refill:  3   metoprolol tartrate (LOPRESSOR) 100 MG tablet  Sig: Take 1 tablet (100 mg total) by mouth once for 1 dose. Take two hours prior to your CT.    Dispense:  1 tablet    Refill:  0   predniSONE (DELTASONE) 50 MG tablet     Sig: Take 50 mg 13 hours, 7 hour, and 1 hour prior to procedure    Dispense:  3 tablet    Refill:  0   diphenhydrAMINE (BENADRYL) 50 MG capsule    Sig: Take 1 capsule 1 hour prior to procedure    Dispense:  1 capsule    Refill:  0    Patient Instructions  Medication Instructions:   Your physician has recommended you make the following change in your medication:   STOP taking furosemide (Lasix)   START taking torsemide (Demadex) 20 mg daily   START taking atorvastatin (Lipitor) 40 mg daily   *If you need a refill on your cardiac medications before your next appointment, please call your pharmacy*   Lab Work: Your physician recommends that you return for lab work (BMP) in: 1 week    Please return to our office on_____________________at______________am/pm   If you have labs (blood work) drawn today and your tests are completely normal, you will receive your results only by: MyChart Message (if you have MyChart) OR A paper copy in the mail If you have any lab test that is abnormal or we need to change your treatment, we will call you to review the results.   Testing/Procedures:  Echocardiogram - Your physician has requested that you have an echocardiogram. Echocardiography is a painless test that uses sound waves to create images of your heart. It provides your doctor with information about the size and shape of your heart and how well your hearts chambers and valves are working. This procedure takes approximately one hour. There are no restrictions for this procedure.     Your cardiac CT will be scheduled at one of the below locations:   [facility] [address] [place], [place] [address] [phone]   If scheduled at [facility], please arrive at the [facility] and Children's Entrance (Entrance C2) of [facility] 30 minutes prior to test start time. You can use the FREE valet parking offered at entrance C (encouraged to control the heart  rate for the test)  Proceed to the [facility] Radiology Department (first floor) to check-in and test prep.  All radiology patients and guests should use entrance C2 at [facility], accessed from [facility], even though the hospital's physical address listed is [address]    If scheduled at [facility], please arrive 15 mins early for check-in and test prep.  Please follow these instructions carefully (unless otherwise directed):  On the Night Before the Test: Be sure to Drink plenty of water. Do not consume any caffeinated/decaffeinated beverages or chocolate 12 hours prior to your test. Do not take any antihistamines 12 hours prior to your test. If the patient has contrast allergy: Patient will need a prescription for Prednisone and very clear instructions (as follows): Prednisone 50 mg - take 13 hours prior to test Take another Prednisone 50 mg 7 hours prior to test Take another Prednisone 50 mg 1 hour prior to test Take Benadryl 50 mg 1 hour prior to test Patient must complete all four doses of above prophylactic medications. Patient will need a ride after test due to Benadryl. We have called in prednisone for you  to take prior to the test. Please follow the above directions.   On the Day of the Test: Drink plenty of water until 1 hour prior to the test. Do not eat any food 4 hours prior to the test. You may take your regular medications prior to the test.  Take metoprolol (Lopressor) two hours prior to test. HOLD torsemide the morning of the test. FEMALES- please wear underwire-free bra if available, avoid dresses & tight clothing  We have called one 100 mg tablet of Lopressor in to your pharmacy. Please take this 2 hours prior to your test.    After the Test: Drink plenty of water. After receiving IV contrast, you may experience a mild flushed feeling. This is normal. On occasion, you may experience a mild rash up  to 24 hours after the test. This is not dangerous. If this occurs, you can take Benadryl 25 mg and increase your fluid intake. If you experience trouble breathing, this can be serious. If it is severe call 911 IMMEDIATELY. If it is mild, please call our office. If you take any of these medications: Glipizide/Metformin, Avandament, Glucavance, please do not take 48 hours after completing test unless otherwise instructed.  We will call to schedule your test 2-4 weeks out understanding that some insurance companies will need an authorization prior to the service being performed.   For non-scheduling related questions, please contact the cardiac imaging nurse navigator should you have any questions/concerns: Rockwell Alexandria, Cardiac Imaging Nurse Navigator Larey Brick, Cardiac Imaging Nurse Navigator [facility] Direct Office Dial: [phone]   For scheduling needs, including cancellations and rescheduling, please call [place], [phone].   Follow-Up: At [facility], you and your health needs are our priority.  As part of our continuing mission to provide you with exceptional heart care, we have created designated Provider Care Teams.  These Care Teams include your primary Cardiologist (physician) and Advanced Practice Providers (APPs -  Physician Assistants and Nurse Practitioners) who all work together to provide you with the care you need, when you need it.  We recommend signing up for the patient portal called "MyChart".  Sign up information is provided on this After Visit Summary.  MyChart is used to connect with patients for Virtual Visits (Telemedicine).  Patients are able to view lab/test results, encounter notes, upcoming appointments, etc.  Non-urgent messages can be sent to your provider as well.   To learn more about what you can do with MyChart, go to [email or web address].    Your next appointment:   4-6 week(s)  The format for your next  appointment:   In Person  Provider:   Debbe Odea, MD    Other Instructions We have referred you to vascular surgery. They will call you to set up this appointment.    Signed, Debbe Odea, MD  11/03/2021 12:13 PM    [facility]

## 2021-11-03 NOTE — Patient Instructions (Addendum)
Medication Instructions:  ? ?Your physician has recommended you make the following change in your medication:  ? ?STOP taking furosemide (Lasix)  ? ?START taking torsemide (Demadex) 20 mg daily  ? ?START taking atorvastatin (Lipitor) 40 mg daily  ? ?*If you need a refill on your cardiac medications before your next appointment, please call your pharmacy* ? ? ?Lab Work: ?Your physician recommends that you return for lab work (BMP) in: 1 week ? ? ? ?Please return to our office on_____________________at______________am/pm  ? ?If you have labs (blood work) drawn today and your tests are completely normal, you will receive your results only by: ?MyChart Message (if you have MyChart) OR ?A paper copy in the mail ?If you have any lab test that is abnormal or we need to change your treatment, we will call you to review the results. ? ? ?Testing/Procedures: ? ?Echocardiogram - Your physician has requested that you have an echocardiogram. Echocardiography is a painless test that uses sound waves to create images of your heart. It provides your doctor with information about the size and shape of your heart and how well your heart?s chambers and valves are working. This procedure takes approximately one hour. There are no restrictions for this procedure.  ? ? ? ?Your cardiac CT will be scheduled at one of the below locations:  ? ?[facility] ?[address] ?[place], [place] [address] ?[phone] ? ? ?If scheduled at [facility], please arrive at the [facility] and Children's Entrance (Entrance C2) of [facility] 30 minutes prior to test start time. ?You can use the FREE valet parking offered at entrance C (encouraged to control the heart rate for the test)  ?Proceed to the [facility] Radiology Department (first floor) to check-in and test prep. ? ?All radiology patients and guests should use entrance C2 at [facility], accessed from [facility], even though the hospital's physical  address listed is [address] ? ? ? ?If scheduled at [facility], please arrive 15 mins early for check-in and test prep. ? ?Please follow these instructions carefully (unless otherwise directed): ? ?On the Night Before the Test: ?Be sure to Drink plenty of water. ?Do not consume any caffeinated/decaffeinated beverages or chocolate 12 hours prior to your test. ?Do not take any antihistamines 12 hours prior to your test. ?If the patient has contrast allergy: ?Patient will need a prescription for Prednisone and very clear instructions (as follows): ?Prednisone 50 mg - take 13 hours prior to test ?Take another Prednisone 50 mg 7 hours prior to test ?Take another Prednisone 50 mg 1 hour prior to test ?Take Benadryl 50 mg 1 hour prior to test ?Patient must complete all four doses of above prophylactic medications. ?Patient will need a ride after test due to Benadryl. ?We have called in prednisone for you to take prior to the test. Please follow the above directions.  ? ?On the Day of the Test: ?Drink plenty of water until 1 hour prior to the test. ?Do not eat any food 4 hours prior to the test. ?You may take your regular medications prior to the test.  ?Take metoprolol (Lopressor) two hours prior to test. ?HOLD torsemide the morning of the test. ?FEMALES- please wear underwire-free bra if available, avoid dresses & tight clothing ? ?We have called one 100 mg tablet of Lopressor in to your pharmacy. Please take this 2 hours prior to your test.  ?  ?After the Test: ?Drink  plenty of water. ?After receiving IV contrast, you may experience a mild flushed feeling. This is normal. ?On occasion, you may experience a mild rash up to 24 hours after the test. This is not dangerous. If this occurs, you can take Benadryl 25 mg and increase your fluid intake. ?If you experience trouble breathing, this can be serious. If it is severe call 911 IMMEDIATELY. If it is mild, please call our  office. ?If you take any of these medications: Glipizide/Metformin, Avandament, Glucavance, please do not take 48 hours after completing test unless otherwise instructed. ? ?We will call to schedule your test 2-4 weeks out understanding that some insurance companies will need an authorization prior to the service being performed.  ? ?For non-scheduling related questions, please contact the cardiac imaging nurse navigator should you have any questions/concerns: ?Marchia Bond, Cardiac Imaging Nurse Navigator ?Gordy Clement, Cardiac Imaging Nurse Navigator ?[facility] ?Direct Office Dial: [phone]  ? ?For scheduling needs, including cancellations and rescheduling, please call [place], [phone].  ? ?Follow-Up: ?At [facility], you and your health needs are our priority.  As part of our continuing mission to provide you with exceptional heart care, we have created designated Provider Care Teams.  These Care Teams include your primary Cardiologist (physician) and Advanced Practice Providers (APPs -  Physician Assistants and Nurse Practitioners) who all work together to provide you with the care you need, when you need it. ? ?We recommend signing up for the patient portal called "MyChart".  Sign up information is provided on this After Visit Summary.  MyChart is used to connect with patients for Virtual Visits (Telemedicine).  Patients are able to view lab/test results, encounter notes, upcoming appointments, etc.  Non-urgent messages can be sent to your provider as well.   ?To learn more about what you can do with MyChart, go to [email or web address].   ? ?Your next appointment:   ?4-6 week(s) ? ?The format for your next appointment:   ?In Person ? ?Provider:   ?Kate Sable, MD  ? ? ?Other Instructions ?We have referred you to vascular surgery. They will call you to set up this appointment.  ?

## 2021-11-04 ENCOUNTER — Ambulatory Visit: Payer: Medicare Other | Admitting: Physical Therapy

## 2021-11-04 DIAGNOSIS — R269 Unspecified abnormalities of gait and mobility: Secondary | ICD-10-CM

## 2021-11-04 DIAGNOSIS — R262 Difficulty in walking, not elsewhere classified: Secondary | ICD-10-CM

## 2021-11-04 DIAGNOSIS — M6281 Muscle weakness (generalized): Secondary | ICD-10-CM | POA: Diagnosis not present

## 2021-11-04 DIAGNOSIS — R2681 Unsteadiness on feet: Secondary | ICD-10-CM

## 2021-11-04 DIAGNOSIS — R2689 Other abnormalities of gait and mobility: Secondary | ICD-10-CM | POA: Diagnosis not present

## 2021-11-04 NOTE — Therapy (Signed)
[place] MAIN [facility] SERVICES [address] [place], [place], [address] Phone: [phone]   Fax:  [phone]  Physical Therapy Treatment  Patient Details  Name: Amber Holder MRN: [id number] Date of Birth: March 19, 1946 Referring Provider (PT): Drema Dallas PA   Encounter Date: 11/04/2021   PT End of Session - 11/04/21 1122     Visit Number 3    Number of Visits 24    Date for PT Re-Evaluation 01/17/22    Authorization Type 1/10 eval 2/28    PT Start Time 1046    PT Stop Time 1128    PT Time Calculation (min) 42 min    Equipment Utilized During Treatment Gait belt    Activity Tolerance Patient tolerated treatment well    Behavior During Therapy [facility] for tasks assessed/performed             Past Medical History:  Diagnosis Date   Anxiety 01/02/2012   Arthritis    Asthma    Chronic kidney disease    Kidney stones   Colon polyps    COPD (chronic obstructive pulmonary disease) ([place])    Diabetes mellitus without complication (HCC)    GERD (gastroesophageal reflux disease)    HOH (hard of hearing)    Bilateral hearing aids   Kidney stone    Osteoporosis    Sleep apnea    Does not use C-PAP on a regular basis   Venous stasis     Past Surgical History:  Procedure Laterality Date   APPENDECTOMY     cataract surgery Bilateral    CHOLECYSTECTOMY     COLONOSCOPY     COLONOSCOPY WITH PROPOFOL N/A 05/28/2015   Procedure: COLONOSCOPY WITH PROPOFOL;  Surgeon: Lollie Sails, MD;  Location[facility] END[facility]COPY;  Service: Endoscopy;  Laterality: N/A;   COLONOSCOPY WITH PROPOFOL N/A 10/28/2018   Procedure: COLONOSCOPY WITH PROPOFOL;  Surgeon: Lollie Sails, MD;  Location: [facility] ENDOSCOPY;  Service: Endoscopy;  Laterality: N/A;   EYE SURGERY Bilateral 2015   Cataract Extraction with IOL   LITHOTRIPSY  2015   has had many stones   Mastoidotomy  1970   ROTATOR CUFF REPAIR Right 2011   TOTAL HIP ARTHROPLASTY Right 06/14/2016    Procedure: TOTAL HIP ARTHROPLASTY;  Surgeon: Dereck Leep, MD;  Location: [facility] ORS;  Service: Orthopedics;  Laterality: Right;    There were no vitals filed for this visit.   Subjective Assessment - 11/04/21 1206     Subjective Patient reports no significant changes since last session with no loss of balance or stumbles.    Pertinent History Patient is a 76 year old female who presents for LE weakness and balance deficits. PMH includes anxiety, arthritis, CKD, colon polyps, COPD, DM, GERD, HOH, Kidney stones, osteoporosis, sleep apnea, venous stasis. Lives alone in [place], [place]. She was originally from [place] but more recently from [place]. 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 She has three adult children who are healthy. Patient falls in the dark; had a fall at the beach last November as well. Patient has dizziness when driving on a bumpy rode. Limited in walking distance.    Limitations Lifting;Standing;Walking;House hold activities    How long can you stand comfortably? has difficulty washing dishes    How long can you walk comfortably? to mailbox    Patient Stated Goals to be able to walk; to improve balance    Pain Onset 1 to 4 weeks ago  Exercise/Activity Sets/ Reps/Time/ Resistance Assistance Charge type Comments- Unless otherwise stated, CGA was provided and gait belt donned in order to ensure pt safety   6MWT  X900 feet  SBA  Therex  Decreased speed with fatigue, also increased M/L sway with fatigue, pt repots fatigue in quad muscle   Semitandem 2 x 45 sec  Neuro re-ed Increased difficulty with R LE posterior, some discomfort in right quad area that was alleviated with rest   Marching on airex pad  10 x each Bilateral fingertip support  Neuro re-ed Good use of ankle strategies throughout   1 lower extremity on Airex pad 1 lower extremity on 6 inch step anteriorly 2 x 45 seconds each  Neuro reEd Good use of ankle strategies no loss of  balance noted patient did have correction via hip and ankle strategies throughout.  Airex step up lateral and anterionr  10 x ea  Intermittnet UE support for anterior step ups, constant UE support for lateral step up and overs  Neuro re-ed Attempted with anterior step ups without UE assist, pt able to copmlete but has noticeble FOF and intermittnet lOB corrected via CGA from PT.   STS  variations  1*5 normal  2*5 ( ea)  with airex pad under ea LE   Therapeutic exercise  5 RPE                                  Treatment provided this session   Pt educated throughout session about proper posture and technique with exercises. Improved exercise technique, movement at target joints, use of target muscles after min to mod verbal, visual, tactile cues. Note: Portions of this document were prepared using Dragon voice recognition software and although reviewed may contain unintentional dictation errors in syntax, grammar, or spelling.                          PT Education - 11/04/21 1210     Education Details exercise forma nd technique    Person(s) Educated Patient    Methods Explanation    Comprehension Verbalized understanding              PT Short Term Goals - 10/25/21 1318       PT SHORT TERM GOAL #1   Title Patient will be independent in home exercise program to improve strength/mobility for better functional independence with ADLs.    Baseline 2/28: HEP given    Time 4    Period Weeks    Status New    Target Date 11/22/21      PT SHORT TERM GOAL #2   Title Patient (> 13 years old) will complete five times sit to stand test in < 15 seconds indicating an increased LE strength and improved balance.    Baseline 2/28: 18.24 seconds hands on knees    Time 4    Period Weeks    Status New    Target Date 11/22/21               PT Long Term Goals - 11/04/21 1107       PT LONG TERM GOAL #1   Title Patient will increase FOTO score to equal to or  greater than  51%   to demonstrate statistically significant improvement in mobility and quality of life    Baseline 2/28; 46%    Time 12  Period Weeks    Status New    Target Date 01/17/22      PT LONG TERM GOAL #2   Title Patient will increase Berg Balance score by > 6 points (43/56)  to demonstrate decreased fall risk during functional activities.    Baseline 2/28: 37/56    Time 12    Period Weeks    Status New    Target Date 01/17/22      PT LONG TERM GOAL #3   Title Patient will increase six minute walk test distance to >1300 for progression to community age norm ambulator and improve gait ability    Baseline 900 feet on 11/04/21    Time 12    Period Weeks    Status New    Target Date 01/17/22      PT LONG TERM GOAL #4   Title Patient will increase BLE gross strength to 4+/5 as to improve functional strength for independent gait, increased standing tolerance and increased ADL ability.    Baseline 2/28: see note    Time 12    Period Weeks    Status New    Target Date 01/17/22                   Plan - 11/04/21 1050     Clinical Impression Statement Patient demonstrates excellent motivation for completion of physical therapy program.  Patient completed 6-minute walk test today and began to fatigue following approximately 3 minutes of ambulation with this fatigue patient demonstrated deterioration of gait pattern with wider base of support and decreased ability noticed with turns.  Patient able to ambulate 900 feet indicating decreased ability to ambulate for community distances.  Patient was progressed with several lower extremity balance and neuromuscular reeducation activities as described in note.  Patient tolerated these well but will continue to benefit from skilled training in this area.  Patient will continue to benefit from skilled PT in order to improve her lower extremity strength, balance, fall risk, and improve her overall quality of life.    Personal  Factors and Comorbidities Age;Comorbidity 3+;Fitness;Past/Current Experience;Social Background;Time since onset of injury/illness/exacerbation;Transportation    Comorbidities anxiety, arthritis, CKD, colon polyps, COPD, DM, GERD, HOH, Kidney stones, osteoporosis, sleep apnea, venous stasis    Examination-Activity Limitations Bathing;Bed Mobility;Bend;Caring for Others;Carry;Locomotion Level;Lift;Hygiene/Grooming;Dressing;Reach Overhead;Sleep;Squat;Transfers;Toileting;Stand;Stairs    Examination-Participation Restrictions Church;Cleaning;Community Activity;Driving;Meal Prep;Laundry;Interpersonal Relationship;Personal Finances;Shop;Volunteer;Yard Work    [employer]chant [employer] officer Evolving/Moderate complexity    Rehab Potential Fair    PT Frequency 2x / week    PT Duration 12 weeks    PT Treatment/Interventions ADLs/Self Care Home Management;Cryotherapy;Electrical Stimulation;Canalith Repostioning;Moist Heat;Iontophoresis '4mg'$ /ml Dexamethasone;Traction;DME Instruction;Gait training;Stair training;Functional mobility training;Therapeutic activities;Therapeutic exercise;Neuromuscular re-education;Balance training;Patient/family education;Manual techniques;Passive range of motion;Dry needling;Vestibular;Taping;Splinting;Energy conservation;Visual/perceptual remediation/compensation    PT Next Visit Plan vestibular screen, 6 minute walk test, Progress LE strengthening and implement balance activities    PT Home Exercise Plan 11/01/2021=Access Code: [email or web address]  URL: [email or web address]    Consulted and Agree with Plan of Care Patient             Patient will benefit from skilled therapeutic intervention in order to improve the following deficits and impairments:  Abnormal gait, Cardiopulmonary status limiting activity, Decreased activity tolerance, Decreased balance, Decreased endurance, Decreased knowledge of use of DME, Decreased safety awareness, Decreased mobility, Decreased  strength, Difficulty walking, Impaired flexibility, Impaired perceived functional ability, Impaired sensation, Postural dysfunction, Improper body mechanics, Pain  Visit Diagnosis: Abnormality of gait and mobility  Difficulty in walking, not elsewhere classified  Unsteadiness on feet     Problem List Patient Active Problem List   Diagnosis Date Noted   Hypokalemia 09/12/2020   Chronic venous stasis 01/11/2020   Atopic dermatitis 67/34/1937   Diastolic dysfunction 90/24/0973   Peripheral edema 09/10/2019   Dyspnea on exertion 09/10/2019   Muscle cramps 09/10/2019   Type 2 diabetes mellitus with hyperglycemia ([place]) 05/18/2019   Seasonal allergic rhinitis 05/18/2019   Screening for breast cancer 05/18/2019   Stenosis of left carotid artery 02/05/2019   Acute upper respiratory infection 10/09/2018   Flu-like symptoms 10/09/2018   Sore throat 10/09/2018   Vitamin D deficiency 07/27/2018   Need for vaccination against Streptococcus pneumoniae using pneumococcal conjugate vaccine 7 07/27/2018   Encounter for general adult medical examination with abnormal findings 04/07/2018   Primary generalized (osteo)arthritis 04/07/2018   Hematuria, microscopic 53/29/9242   Renal colic 68/34/1962   Urinary tract infection without hematuria 11/21/2017   Dysuria 11/21/2017   Uncontrolled type 2 diabetes mellitus with hyperglycemia ([place]) 11/21/2017   Diabetes mellitus type 2, uncomplicated ([place]) 22/97/9892   Osteoporosis 10/04/2017   S/P total hip arthroplasty 06/14/2016   Inguinal hernia, left 11/94/1740   Umbilical hernia 81/44/8185   Increased frequency of urination 11/27/2012   Chronic cystitis 11/19/2012   Incomplete emptying of bladder 11/19/2012   Medullary sponge kidney 11/19/2012   Mixed urge and stress incontinence 11/19/2012   Anxiety 01/02/2012   Calculus of kidney 01/02/2012   Chronic obstructive pulmonary disease ([place]) 01/02/2012   GERD (gastroesophageal reflux disease)  01/02/2012   Hearing loss 01/02/2012   Mixed hyperlipidemia 01/02/2012   Obesity, unspecified 01/02/2012   OSA on CPAP 01/02/2012   Tobacco abuse 01/02/2012   Venous insufficiency 01/02/2012    Particia Lather, PT 11/04/2021, 12:11 PM  [place] MAIN [facility] SERVICES [place], [place], [address] Phone: [phone]   Fax:  [phone]  Name: Amber Holder MRN: [id number] Date of Birth: November 27, 1945

## 2021-11-04 NOTE — Addendum Note (Signed)
Addended by: Janan Ridge on: 11/04/2021 09:20 AM ? ? Modules accepted: Orders ? ?

## 2021-11-07 ENCOUNTER — Ambulatory Visit: Payer: Medicare Other | Admitting: Physical Therapy

## 2021-11-07 ENCOUNTER — Other Ambulatory Visit: Payer: Self-pay

## 2021-11-07 DIAGNOSIS — R262 Difficulty in walking, not elsewhere classified: Secondary | ICD-10-CM

## 2021-11-07 DIAGNOSIS — M6281 Muscle weakness (generalized): Secondary | ICD-10-CM | POA: Diagnosis not present

## 2021-11-07 DIAGNOSIS — R269 Unspecified abnormalities of gait and mobility: Secondary | ICD-10-CM | POA: Diagnosis not present

## 2021-11-07 DIAGNOSIS — R2681 Unsteadiness on feet: Secondary | ICD-10-CM

## 2021-11-07 DIAGNOSIS — R2689 Other abnormalities of gait and mobility: Secondary | ICD-10-CM | POA: Diagnosis not present

## 2021-11-07 NOTE — Therapy (Signed)
[place] MAIN [facility] SERVICES [address] [place], [place], [address] Phone: [phone]   Fax:  [phone]  Physical Therapy Treatment  Patient Details  Name: Amber Holder MRN: [id number] Date of Birth: December 14, 1945 Referring Provider (PT): Drema Dallas PA   Encounter Date: 11/07/2021   PT End of Session - 11/07/21 1128     Visit Number 4    Number of Visits 24    Date for PT Re-Evaluation 01/17/22    Authorization Type 1/10 eval 2/28    PT Start Time 9323    PT Stop Time 1230    PT Time Calculation (min) 45 min    Equipment Utilized During Treatment Gait belt    Activity Tolerance Patient tolerated treatment well    Behavior During Therapy [facility] for tasks assessed/performed             Past Medical History:  Diagnosis Date   Anxiety 01/02/2012   Arthritis    Asthma    Chronic kidney disease    Kidney stones   Colon polyps    COPD (chronic obstructive pulmonary disease) ([place])    Diabetes mellitus without complication (HCC)    GERD (gastroesophageal reflux disease)    HOH (hard of hearing)    Bilateral hearing aids   Kidney stone    Osteoporosis    Sleep apnea    Does not use C-PAP on a regular basis   Venous stasis     Past Surgical History:  Procedure Laterality Date   APPENDECTOMY     cataract surgery Bilateral    CHOLECYSTECTOMY     COLONOSCOPY     COLONOSCOPY WITH PROPOFOL N/A 05/28/2015   Procedure: COLONOSCOPY WITH PROPOFOL;  Surgeon: Lollie Sails, MD;  Location: [facility] ENDOSCOPY;  Service: Endoscopy;  Laterality: N/A;   COLONOSCOPY WITH PROPOFOL N/A 10/28/2018   Procedure: COLONOSCOPY WITH PROPOFOL;  Surgeon: Lollie Sails, MD;  Location: [facility] ENDOSCOPY;  Service: Endoscopy;  Laterality: N/A;   EYE SURGERY Bilateral 2015   Cataract Extraction with IOL   LITHOTRIPSY  2015   has had many stones   Mastoidotomy  1970   ROTATOR CUFF REPAIR Right 2011   TOTAL HIP ARTHROPLASTY Right 06/14/2016    Procedure: TOTAL HIP ARTHROPLASTY;  Surgeon: Dereck Leep, MD;  Location: [facility] ORS;  Service: Orthopedics;  Laterality: Right;    There were no vitals filed for this visit.   Subjective Assessment - 11/07/21 1100     Subjective Patient reports no significant changes since last session with no loss of balance or stumbles.    Pertinent History Patient is a 76 year old female who presents for LE weakness and balance deficits. PMH includes anxiety, arthritis, CKD, colon polyps, COPD, DM, GERD, HOH, Kidney stones, osteoporosis, sleep apnea, venous stasis. Lives alone in [place], [place]. She was originally from [place] but more recently from [place]. 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 She has three adult children who are healthy. Patient falls in the dark; had a fall at the beach last November as well. Patient has dizziness when driving on a bumpy rode. Limited in walking distance.    Limitations Lifting;Standing;Walking;House hold activities    How long can you stand comfortably? has difficulty washing dishes    How long can you walk comfortably? to mailbox    Patient Stated Goals to be able to walk; to improve balance    Currently in Pain? Yes    Pain Score 5  Pain Location Knee    Pain Orientation Right;Anterior    Pain Descriptors / Indicators Sore    Pain Type Acute pain    Pain Onset 1 to 4 weeks ago             Exercise/Activity Sets/ Reps/Time/ Management consultant type Comments- Unless otherwise stated, CGA was provided and gait belt donned in order to ensure pt safety   Nustep interval training  7 min total  2 min warm up then alternating 1 min level 1 and 1 min level 3 x 7 min   therex Monitoring of spm, and   Semitandem 2 x 45 sec  Neuro re-ed Increased difficulty with R LE posterior, some discomfort in right quad area that was alleviated with rest   Marching on airex pad  10 x each Bilateral fingertip support  Neuro re-ed Good use of ankle  strategies throughout, cues to minimize grip with fingers   1 lower extremity on Airex pad 1 lower extremity on 6 inch step anteriorly 2 x 45 seconds each  Neuro reEd Good use of ankle strategies         STS  variations  2*6 ea   Therapeutic exercise  5-6  RPE  92% O2 sat  93% 1 min postr 93% 2 min post   1/2 Foam roller rockers  2*15 B UE  There ex 3-4 RPE  LAQ  2*10 w/ 3#AW  There ex                      Treatment provided this session   Pt educated throughout session about proper posture and technique with exercises. Improved exercise technique, movement at target joints, use of target muscles after min to mod verbal, visual, tactile cues. Note: Portions of this document were prepared using Dragon voice recognition software and although reviewed may contain unintentional dictation errors in syntax, grammar, or spelling.                              PT Education - 11/07/21 1128     Education Details exercise form and technique    Person(s) Educated Patient    Methods Explanation    Comprehension Verbalized understanding              PT Short Term Goals - 10/25/21 1318       PT SHORT TERM GOAL #1   Title Patient will be independent in home exercise program to improve strength/mobility for better functional independence with ADLs.    Baseline 2/28: HEP given    Time 4    Period Weeks    Status New    Target Date 11/22/21      PT SHORT TERM GOAL #2   Title Patient (> 50 years old) will complete five times sit to stand test in < 15 seconds indicating an increased LE strength and improved balance.    Baseline 2/28: 18.24 seconds hands on knees    Time 4    Period Weeks    Status New    Target Date 11/22/21               PT Long Term Goals - 11/04/21 1107       PT LONG TERM GOAL #1   Title Patient will increase FOTO score to equal to or greater than  51%   to demonstrate statistically significant improvement in mobility and quality  of life    Baseline 2/28; 46%    Time 12    Period Weeks    Status New    Target Date 01/17/22      PT LONG TERM GOAL #2   Title Patient will increase Berg Balance score by > 6 points (43/56)  to demonstrate decreased fall risk during functional activities.    Baseline 2/28: 37/56    Time 12    Period Weeks    Status New    Target Date 01/17/22      PT LONG TERM GOAL #3   Title Patient will increase six minute walk test distance to >1300 for progression to community age norm ambulator and improve gait ability    Baseline 900 feet on 11/04/21    Time 12    Period Weeks    Status New    Target Date 01/17/22      PT LONG TERM GOAL #4   Title Patient will increase BLE gross strength to 4+/5 as to improve functional strength for independent gait, increased standing tolerance and increased ADL ability.    Baseline 2/28: see note    Time 12    Period Weeks    Status New    Target Date 01/17/22                   Plan - 11/07/21 1128     Clinical Impression Statement Pt demonstrates excellent motivation for completion of PT program. Pt has continued knee pain but did not report increase in knee pain with PT activities completed today. Began to incorporate interval training to ipmrove endurance with ambulation and other tasks.Patient tolerated these well but will continue to benefit from skilled training in this area. Patient will continue to benefit from skilled PT in order to improve her lower extremity strength, balance, fall risk, and improve her overall quality of life.    Personal Factors and Comorbidities Age;Comorbidity 3+;Fitness;Past/Current Experience;Social Background;Time since onset of injury/illness/exacerbation;Transportation    Comorbidities anxiety, arthritis, CKD, colon polyps, COPD, DM, GERD, HOH, Kidney stones, osteoporosis, sleep apnea, venous stasis    Examination-Activity Limitations Bathing;Bed Mobility;Bend;Caring for Others;Carry;Locomotion  Level;Lift;Hygiene/Grooming;Dressing;Reach Overhead;Sleep;Squat;Transfers;Toileting;Stand;Stairs    Examination-Participation Restrictions Church;Cleaning;Community Activity;Driving;Meal Prep;Laundry;Interpersonal Relationship;Personal Finances;Shop;Volunteer;Yard Work    Merchant [employer] officer Evolving/Moderate complexity    Rehab Potential Fair    PT Frequency 2x / week    PT Duration 12 weeks    PT Treatment/Interventions ADLs/Self Care Home Management;Cryotherapy;Electrical Stimulation;Canalith Repostioning;Moist Heat;Iontophoresis '4mg'$ /ml Dexamethasone;Traction;DME Instruction;Gait training;Stair training;Functional mobility training;Therapeutic activities;Therapeutic exercise;Neuromuscular re-education;Balance training;Patient/family education;Manual techniques;Passive range of motion;Dry needling;Vestibular;Taping;Splinting;Energy conservation;Visual/perceptual remediation/compensation    PT Next Visit Plan vestibular screen, 6 minute walk test, Progress LE strengthening and implement balance activities    PT Home Exercise Plan 11/01/2021=Access Code: [email or web address]  URL: [email or web address]    Consulted and Agree with Plan of Care Patient             Patient will benefit from skilled therapeutic intervention in order to improve the following deficits and impairments:  Abnormal gait, Cardiopulmonary status limiting activity, Decreased activity tolerance, Decreased balance, Decreased endurance, Decreased knowledge of use of DME, Decreased safety awareness, Decreased mobility, Decreased strength, Difficulty walking, Impaired flexibility, Impaired perceived functional ability, Impaired sensation, Postural dysfunction, Improper body mechanics, Pain  Visit Diagnosis: Abnormality of gait and mobility  Difficulty in walking, not elsewhere classified  Unsteadiness on feet     Problem List Patient Active Problem List   Diagnosis Date Noted   Hypokalemia 09/12/2020    Chronic  venous stasis 01/11/2020   Atopic dermatitis 76/16/0737   Diastolic dysfunction 10/62/6948   Peripheral edema 09/10/2019   Dyspnea on exertion 09/10/2019   Muscle cramps 09/10/2019   Type 2 diabetes mellitus with hyperglycemia ([place]) 05/18/2019   Seasonal allergic rhinitis 05/18/2019   Screening for breast cancer 05/18/2019   Stenosis of left carotid artery 02/05/2019   Acute upper respiratory infection 10/09/2018   Flu-like symptoms 10/09/2018   Sore throat 10/09/2018   Vitamin D deficiency 07/27/2018   Need for vaccination against Streptococcus pneumoniae using pneumococcal conjugate vaccine 7 07/27/2018   Encounter for general adult medical examination with abnormal findings 04/07/2018   Primary generalized (osteo)arthritis 04/07/2018   Hematuria, microscopic 54/62/7035   Renal colic 00/93/8182   Urinary tract infection without hematuria 11/21/2017   Dysuria 11/21/2017   Uncontrolled type 2 diabetes mellitus with hyperglycemia ([place]) 11/21/2017   Diabetes mellitus type 2, uncomplicated ([place]) 99/37/1696   Osteoporosis 10/04/2017   S/P total hip arthroplasty 06/14/2016   Inguinal hernia, left 78/93/8101   Umbilical hernia 75/05/2584   Increased frequency of urination 11/27/2012   Chronic cystitis 11/19/2012   Incomplete emptying of bladder 11/19/2012   Medullary sponge kidney 11/19/2012   Mixed urge and stress incontinence 11/19/2012   Anxiety 01/02/2012   Calculus of kidney 01/02/2012   Chronic obstructive pulmonary disease ([place]) 01/02/2012   GERD (gastroesophageal reflux disease) 01/02/2012   Hearing loss 01/02/2012   Mixed hyperlipidemia 01/02/2012   Obesity, unspecified 01/02/2012   OSA on CPAP 01/02/2012   Tobacco abuse 01/02/2012   Venous insufficiency 01/02/2012    Particia Lather, PT 11/07/2021, 2:39 PM  [place] MAIN [facility] SERVICES [place], [place], [address] Phone: [phone]   Fax:   [phone]  Name: Amber Holder MRN: [id number] Date of Birth: 08-31-1945

## 2021-11-09 ENCOUNTER — Other Ambulatory Visit: Payer: Self-pay

## 2021-11-09 ENCOUNTER — Telehealth: Payer: Self-pay

## 2021-11-09 ENCOUNTER — Other Ambulatory Visit (INDEPENDENT_AMBULATORY_CARE_PROVIDER_SITE_OTHER): Payer: Medicare Other

## 2021-11-09 DIAGNOSIS — I2510 Atherosclerotic heart disease of native coronary artery without angina pectoris: Secondary | ICD-10-CM | POA: Diagnosis not present

## 2021-11-09 MED ORDER — TORSEMIDE 20 MG PO TABS: 20.0000 mg | ORAL_TABLET | Freq: Every day | ORAL | 3 refills | Status: DC

## 2021-11-09 NOTE — Telephone Encounter (Signed)
Received a call from [place] in the lab today. Patient was here for a lab draw and informed [place] that she had been taking her Torsemide as 20 MG twice a day, rather than once a day as advised at [facility] on 11/03/21. Patient advised to reduce Torsemide to 20 MG once a day. Patient did confirm that she stopped her Lasix as instructed. ? ?

## 2021-11-10 ENCOUNTER — Other Ambulatory Visit: Payer: Self-pay | Admitting: Physician Assistant

## 2021-11-10 ENCOUNTER — Other Ambulatory Visit: Payer: MEDICARE

## 2021-11-10 LAB — BASIC METABOLIC PANEL
BUN/Creatinine Ratio: 23 (ref 12–28)
BUN: 19 mg/dL (ref 8–27)
CO2: 22 mmol/L (ref 20–29)
Calcium: 9.1 mg/dL (ref 8.7–10.3)
Chloride: 101 mmol/L (ref 96–106)
Creatinine, Ser: 0.83 mg/dL (ref 0.57–1.00)
Glucose: 106 mg/dL — ABNORMAL HIGH (ref 70–99)
Potassium: 4 mmol/L (ref 3.5–5.2)
Sodium: 141 mmol/L (ref 134–144)
eGFR: 73 mL/min/{1.73_m2} (ref >59)

## 2021-11-11 ENCOUNTER — Ambulatory Visit (INDEPENDENT_AMBULATORY_CARE_PROVIDER_SITE_OTHER): Payer: Medicare Other | Admitting: Nurse Practitioner

## 2021-11-11 ENCOUNTER — Other Ambulatory Visit: Payer: Self-pay

## 2021-11-11 ENCOUNTER — Encounter (INDEPENDENT_AMBULATORY_CARE_PROVIDER_SITE_OTHER): Payer: Self-pay | Admitting: Nurse Practitioner

## 2021-11-11 VITALS — BP 119/71 | HR 85 | Resp 16 | Wt 190.6 lb

## 2021-11-11 DIAGNOSIS — M79604 Pain in right leg: Secondary | ICD-10-CM

## 2021-11-11 DIAGNOSIS — R609 Edema, unspecified: Secondary | ICD-10-CM

## 2021-11-11 DIAGNOSIS — I6522 Occlusion and stenosis of left carotid artery: Secondary | ICD-10-CM

## 2021-11-11 DIAGNOSIS — M79605 Pain in left leg: Secondary | ICD-10-CM | POA: Diagnosis not present

## 2021-11-11 NOTE — Progress Notes (Signed)
? ?Subjective:  ? ? Patient ID: Amber Holder, female    DOB: [birth year], 76 y.o.   MRN: [id number] ?Chief Complaint  ?Patient presents with  ? New Patient (Initial Visit)  ?  Ref Ag[place]-Etang bilateral leg pain  ? ? ?Amber Holder is a 76 year old female that presents today for evaluation of bilateral leg pain as a referral from her cardiologist Dr. Garen Lah.  The patient is a current smoker with a past history of venous insufficiency, PAD and carotid stenosis.  She notes that recently she has been having issues with walking and notes weakness in her thighs after walking a short distance.  The patient previously wore compression socks on a daily basis however recently has not been wearing them as frequently due to them being tighter and more difficult to wear.  She denies any TIA-like symptoms or amaurosis fugax.  Is been a few years since her carotids have been checked.  It was noted that there was no significant plaque on the right with less than 50% on the left. ? ? ?Review of Systems  ?Cardiovascular:  Positive for leg swelling.  ?Neurological:  Positive for weakness.  ?All other systems reviewed and are negative. ? ?   ?Objective:  ? Physical Exam ?Vitals reviewed.  ?HENT:  ?   Head: Normocephalic.  ?Cardiovascular:  ?   Rate and Rhythm: Normal rate.  ?   Pulses:     ?     Dorsalis pedis pulses are 1+ on the right side and 1+ on the left side.  ?Pulmonary:  ?   Effort: Pulmonary effort is normal.  ?Skin: ?   General: Skin is warm and dry.  ?Neurological:  ?   Mental Status: She is alert and oriented to person, place, and time.  ?Psychiatric:     ?   Mood and Affect: Mood normal.     ?   Behavior: Behavior normal.     ?   Thought Content: Thought content normal.     ?   Judgment: Judgment normal.  ? ? ?BP 119/71 (BP Location: Left Arm)   Pulse 85   Resp 16   Wt 190 lb 9.6 oz (86.5 kg)   BMI 30.76 kg/m?  ? ?Past Medical History:  ?Diagnosis Date  ? Anxiety 01/02/2012  ? Arthritis   ? Asthma   ? Chronic  kidney disease   ? Kidney stones  ? Colon polyps   ? COPD (chronic obstructive pulmonary disease) ([place])   ? Diabetes mellitus without complication ([place])   ? GERD (gastroesophageal reflux disease)   ? HOH (hard of hearing)   ? Bilateral hearing aids  ? Kidney stone   ? Osteoporosis   ? Sleep apnea   ? Does not use C-PAP on a regular basis  ? Venous stasis   ? ? ?Social History  ? ?Socioeconomic History  ? Marital status: Divorced  ?  Spouse name: Not on file  ? Number of children: Not on file  ? Years of education: Not on file  ? Highest education level: Not on file  ?Occupational History  ? Not on file  ?Tobacco Use  ? Smoking status: Every Day  ?  Packs/day: 1.00  ?  Types: Cigarettes  ? Smokeless tobacco: Never  ? Tobacco comments:  ?  1 pack daily  ?Vaping Use  ? Vaping Use: Never used  ?Substance and Sexual Activity  ? Alcohol use: Yes  ?  Comment: very rarely   ? Drug  use: No  ? Sexual activity: Not Currently  ?Other Topics Concern  ? Not on file  ?Social History Narrative  ? Not on file  ? ?Social Determinants of Health  ? ?Financial Resource Strain: Not on file  ?Food Insecurity: Not on file  ?Transportation Needs: Not on file  ?Physical Activity: Not on file  ?Stress: Not on file  ?Social Connections: Not on file  ?Intimate Partner Violence: Not on file  ? ? ?Past Surgical History:  ?Procedure Laterality Date  ? APPENDECTOMY    ? cataract surgery Bilateral   ? CHOLECYSTECTOMY    ? COLONOSCOPY    ? COLONOSCOPY WITH PROPOFOL N/A 05/28/2015  ? Procedure: COLONOSCOPY WITH PROPOFOL;  Surgeon: Lollie Sails, MD;  Location: [facility] ENDOSCOPY;  Service: Endoscopy;  Laterality: N/A;  ? COLONOSCOPY WITH PROPOFOL N/A 10/28/2018  ? Procedure: COLONOSCOPY WITH PROPOFOL;  Surgeon: Lollie Sails, MD;  Location: [facility] ENDOSCOPY;  Service: Endoscopy;  Laterality: N/A;  ? EYE SURGERY Bilateral 2015  ? Cataract Extraction with IOL  ? LITHOTRIPSY  2015  ? has had many stones  ? Mastoidotomy  1970  ? ROTATOR CUFF REPAIR Right  2011  ? TOTAL HIP ARTHROPLASTY Right 06/14/2016  ? Procedure: TOTAL HIP ARTHROPLASTY;  Surgeon: Dereck Leep, MD;  Location: [facility] ORS;  Service: Orthopedics;  Laterality: Right;  ? ? ?Family History  ?Problem Relation Age of Onset  ? Breast cancer Maternal Aunt 67  ? Bladder Cancer Neg Hx   ? Kidney cancer Neg Hx   ? ? ?Allergies  ?Allergen Reactions  ? Shellfish Allergy Swelling  ?  "shrimp" ?Betadine okay  ? Sulfa Antibiotics Hives  ?  Difficulty breathing  ? Sulfasalazine Hives  ?  Difficulty breathing  ? Percocet [Oxycodone-Acetaminophen] Hives  ?  Difficulty breathing  ? Iodinated Contrast Media Itching  ? Iodine Itching  ? ? ?CBC Latest Ref Rng & Units 12/24/2020 09/11/2019 07/09/2018  ?WBC 3.4 - 10.8 x10E3/uL 8.5 8.0 6.9  ?Hemoglobin 11.1 - 15.9 g/dL 16.1(H) 15.0 14.4  ?Hematocrit 34.0 - 46.6 % 46.9(H) 44.4 43.2  ?Platelets 150 - 450 x10E3/uL 238 240 244  ? ? ? ? ?CMP  ?   ?Component Value Date/Time  ? NA 141 11/09/2021 1013  ? NA 138 07/16/2013 0521  ? K 4.0 11/09/2021 1013  ? K 3.9 07/16/2013 0521  ? CL 101 11/09/2021 1013  ? CL 108 (H) 07/16/2013 0521  ? CO2 22 11/09/2021 1013  ? CO2 25 07/16/2013 0521  ? GLUCOSE 106 (H) 11/09/2021 1013  ? GLUCOSE 115 (H) 07/07/2016 1951  ? GLUCOSE 102 (H) 07/16/2013 0521  ? BUN 19 11/09/2021 1013  ? BUN 13 07/16/2013 0521  ? CREATININE 0.83 11/09/2021 1013  ? CREATININE 0.71 07/16/2013 0521  ? CALCIUM 9.1 11/09/2021 1013  ? CALCIUM 7.8 (L) 07/16/2013 0521  ? PROT 6.7 06/13/2021 1406  ? ALBUMIN 4.4 06/13/2021 1406  ? AST 18 06/13/2021 1406  ? ALT 16 06/13/2021 1406  ? ALKPHOS 69 06/13/2021 1406  ? BILITOT 0.5 06/13/2021 1406  ? GFRNONAA 90 09/11/2019 1035  ? GFRNONAA >60 07/16/2013 0521  ? GFRAA 104 09/11/2019 1035  ? GFRAA >60 07/16/2013 0521  ? ? ? ?No results found. ? ?   ?Assessment & Plan:  ? ?1. Peripheral edema ?The patient does have evidence of lower extremity edema.  She is advised to continue with conservative therapy including use of compression socks and  elevation.  This is likely related to venous insufficiency.  We will have the patient undergo a venous reflux study to determine if there is any evidence of venous reflux.  The patient may also be a good candidate for lymphedema pump. ? ?2. Stenosis of left carotid artery ?The patient has known carotid stenosis which has not been checked in several years.  We will have a carotid duplex at patient's follow-up. ? ?3. Leg pain, bilateral ?Description of pain is suspicious for claudication-like symptoms.  We will have the patient ABIs at follow-up visit. ? ? ?Current Outpatient Medications on File Prior to Visit  ?Medication Sig Dispense Refill  ? acetaminophen (TYLENOL) 500 MG chewable tablet Chew 1,000 mg by mouth every 8 (eight) hours as needed for pain.    ? albuterol (VENTOLIN HFA) 108 (90 Base) MCG/ACT inhaler Inhale 2 puffs into the lungs every 6 (six) hours as needed for wheezing or shortness of breath. 18 g 2  ? aspirin EC 81 MG tablet Take 1 tablet (81 mg total) by mouth daily. Swallow whole. 90 tablet 3  ? atorvastatin (LIPITOR) 40 MG tablet Take 1 tablet (40 mg total) by mouth daily. 90 tablet 3  ? cholecalciferol (VITAMIN D) 400 UNITS TABS tablet Take 2,000 Units by mouth daily.     ? CRANBERRY PO Take 25,000 mg by mouth daily.    ? diphenhydrAMINE (BENADRYL) 50 MG capsule Take 1 capsule 1 hour prior to procedure 1 capsule 0  ? ergocalciferol (DRISDOL) 1.25 MG (50000 UT) capsule Take one cap q week 12 capsule 3  ? fluticasone (FLOVENT HFA) 110 MCG/ACT inhaler Inhale 2 puffs into the lungs 2 (two) times daily. Use 2 puffs bid for congestion 1 each 12  ? glucose blood (ONETOUCH VERIO) test strip BLOOD SUGAR TESTING ONCE DAILY . DX E11.65 100 strip 3  ? hydrocortisone cream 0.5 % Apply topically as needed.     ? ibandronate (BONIVA) 150 MG tablet Take 1 tablet (150 mg total) by mouth every 30 (thirty) days. 3 tablet 3  ? Lancets (ONETOUCH DELICA PLUS OYDXAJ28N) MISC Use  as directed twice a daily DX E11.65 100  each 3  ? Magnesium 250 MG TABS Take by mouth daily.    ? montelukast (SINGULAIR) 10 MG tablet TAKE 1 TABLET BY MOUTH DAILY FOR ALLERGIES 90 tablet 3  ? omeprazole (PRILOSEC) 40 MG capsule TAKE 1 CAPSULE (40 MG TOT

## 2021-11-12 ENCOUNTER — Encounter (INDEPENDENT_AMBULATORY_CARE_PROVIDER_SITE_OTHER): Payer: Self-pay | Admitting: Nurse Practitioner

## 2021-11-14 ENCOUNTER — Telehealth (HOSPITAL_COMMUNITY): Payer: Self-pay | Admitting: Emergency Medicine

## 2021-11-14 NOTE — Telephone Encounter (Signed)
Reaching out to patient to offer assistance regarding upcoming cardiac imaging study; pt verbalizes understanding of appt date/time, parking situation and where to check in, pre-test NPO status and medications ordered, and verified current allergies; name and call back number provided for further questions should they arise ?Marchia Bond RN Navigator Cardiac Imaging ?[facility] ?[phone] office ?[phone] cell ? ?Denies iv issues ?13 hr prep + '100mg'$  metoprolol tartrate ?Arrival 1100 ?

## 2021-11-15 ENCOUNTER — Encounter: Payer: Self-pay | Admitting: Physical Therapy

## 2021-11-15 ENCOUNTER — Other Ambulatory Visit: Payer: Self-pay

## 2021-11-15 ENCOUNTER — Ambulatory Visit: Payer: Medicare Other | Admitting: Physical Therapy

## 2021-11-15 DIAGNOSIS — R2681 Unsteadiness on feet: Secondary | ICD-10-CM | POA: Diagnosis not present

## 2021-11-15 DIAGNOSIS — R2689 Other abnormalities of gait and mobility: Secondary | ICD-10-CM | POA: Diagnosis not present

## 2021-11-15 DIAGNOSIS — R269 Unspecified abnormalities of gait and mobility: Secondary | ICD-10-CM | POA: Diagnosis not present

## 2021-11-15 DIAGNOSIS — R262 Difficulty in walking, not elsewhere classified: Secondary | ICD-10-CM | POA: Diagnosis not present

## 2021-11-15 DIAGNOSIS — M6281 Muscle weakness (generalized): Secondary | ICD-10-CM | POA: Diagnosis not present

## 2021-11-15 NOTE — Therapy (Signed)
[facility] ?[place], [place], [address] ?Phone: [phone]   Fax:  [phone] ? ?Physical Therapy Treatment ? ?Patient Details  ?Name: Amber Holder ?MRN: [id number] ?Date of Birth: 1945/10/14 ?Referring Provider (PT): Drema Dallas PA ? ? ?Encounter Date: 11/15/2021 ? ? PT End of Session - 11/15/21 1438   ? ? Visit Number 5   ? Number of Visits 24   ? Date for PT Re-Evaluation 01/17/22   ? Authorization Type 1/10 eval 2/28   ? PT Start Time 1145   ? PT Stop Time 1228   ? PT Time Calculation (min) 43 min   ? Equipment Utilized During Treatment Gait belt   ? Activity Tolerance Patient tolerated treatment well   ? Behavior During Therapy [facility] for tasks assessed/performed   ? ?  ?  ? ?  ? ? ?Past Medical History:  ?Diagnosis Date  ? Anxiety 01/02/2012  ? Arthritis   ? Asthma   ? Chronic kidney disease   ? Kidney stones  ? Colon polyps   ? COPD (chronic obstructive pulmonary disease) ([place])   ? Diabetes mellitus without complication ([place])   ? GERD (gastroesophageal reflux disease)   ? HOH (hard of hearing)   ? Bilateral hearing aids  ? Kidney stone   ? Osteoporosis   ? Sleep apnea   ? Does not use C-PAP on a regular basis  ? Venous stasis   ? ? ?Past Surgical History:  ?Procedure Laterality Date  ? APPENDECTOMY    ? cataract surgery Bilateral   ? CHOLECYSTECTOMY    ? COLONOSCOPY    ? COLONOSCOPY WITH PROPOFOL N/A 05/28/2015  ? Procedure: COLONOSCOPY WITH PROPOFOL;  Surgeon: Lollie Sails, MD;  Location: [facility] ENDOSCOPY;  Service: Endoscopy;  Laterality: N/A;  ? COLONOSCOPY WITH PROPOFOL N/A 10/28/2018  ? Procedure: COLONOSCOPY WITH PROPOFOL;  Surgeon: Lollie Sails, MD;  Location: [facility] ENDOSCOPY;  Service: Endoscopy;  Laterality: N/A;  ? EYE SURGERY Bilateral 2015  ? Cataract Extraction with IOL  ? LITHOTRIPSY  2015  ? has had many stones  ? Mastoidotomy  1970  ? ROTATOR CUFF REPAIR Right 2011  ? TOTAL HIP ARTHROPLASTY Right 06/14/2016  ?  Procedure: TOTAL HIP ARTHROPLASTY;  Surgeon: Dereck Leep, MD;  Location: [facility] ORS;  Service: Orthopedics;  Laterality: Right;  ? ? ?There were no vitals filed for this visit. ? ? Subjective Assessment - 11/15/21 1149   ? ? Subjective Pt reports doing okay since last appointment. Has been able to perform exercises 2 times but had a lot of difficulty with leg pain following medication change but this has improved since changing this.   ? Pertinent History Patient is a 76 year old female who presents for LE weakness and balance deficits. PMH includes anxiety, arthritis, CKD, colon polyps, COPD, DM, GERD, HOH, Kidney stones, osteoporosis, sleep apnea, venous stasis. Lives alone in [place], [place]. She was originally from [place] but more recently from [place]. 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 She has three adult children who are healthy. Patient falls in the dark; had a fall at the beach last November as well. Patient has dizziness when driving on a bumpy rode. Limited in walking distance.   ? Limitations Lifting;Standing;Walking;House hold activities   ? How long can you stand comfortably? has difficulty washing dishes   ? How long can you walk comfortably? to mailbox   ? Patient Stated Goals to be able  to walk; to improve balance   ? Pain Onset 1 to 4 weeks ago   ? ?  ?  ? ?  ? ? ?Exercise/Activity Sets/ Reps/Time/ Resistance Assistance Charge type Comments- Unless otherwise stated, CGA was provided and gait belt donned in order to ensure pt safety ?  ?      ?Semitandem - theraband pad under anterior LE  2 x 45 sec  Neuro re-ed Increased difficulty with R LE posterior, cues for utilization of upper extremity support for transitional movements.  First arm patient had significant signs of instability but with second round patient demonstrated improved balance and tolerance for this activity  ?Marching on airex pad  10 x each ?5 x unilateral UE support ( 5 ea hand) Bilateral fingertip support  Neuro  re-ed Good use of ankle strategies throughout, cues to minimize grip with fingers   ?1 lower extremity on Airex pad 1 lower extremity on 6 inch step anteriorly 2 x 45 seconds each  Neuro reEd Good use of ankle strategies   ?Airex step up and over  X5 ea side   NMR No LOB, good use of ankle strategies  ?STS  and walking circuit  2*4 ea and 160 feet walking between each   Therapeutic exercise  5  RPE  ?With last 2 rounds of ambulation patient had a decrease in ambulation speed  ?1/2 Foam roller rockers  2*15 B UE  There ex 3-4 RPE ?Cues for utilization of ankle strategies  ?LAQ  2*10 w/ 3#AW  There ex  3-4RPE  ?      ?      ?      ?Treatment provided this session ? ? ?Pt educated throughout session about proper posture and technique with exercises. Improved exercise technique, movement at target joints, use of target muscles after min to mod verbal, visual, tactile cues. ?Note: Portions of this document were prepared using Dragon voice recognition software and although reviewed may contain unintentional dictation errors in syntax, grammar, or spelling. ?  ? ? ? ? ? ? ? ? ? ? ? ? ? ? ? ? ? ? ? ? ? ? ? ? ? ? ? ? ? ? PT Education - 11/15/21 1437   ? ? Education Details exercie form and technique   ? Person(s) Educated Patient   ? Methods Explanation   ? Comprehension Verbalized understanding   ? ?  ?  ? ?  ? ? ? PT Short Term Goals - 10/25/21 1318   ? ?  ? PT SHORT TERM GOAL #1  ? Title Patient will be independent in home exercise program to improve strength/mobility for better functional independence with ADLs.   ? Baseline 2/28: HEP given   ? Time 4   ? Period Weeks   ? Status New   ? Target Date 11/22/21   ?  ? PT SHORT TERM GOAL #2  ? Title Patient (> 81 years old) will complete five times sit to stand test in < 15 seconds indicating an increased LE strength and improved balance.   ? Baseline 2/28: 18.24 seconds hands on knees   ? Time 4   ? Period Weeks   ? Status New   ? Target Date 11/22/21   ? ?  ?  ? ?  ? ? ? ?  PT Long Term Goals - 11/04/21 1107   ? ?  ? PT LONG TERM GOAL #1  ? Title Patient will increase FOTO score  to equal to or greater than  51%   to demonstrate statistically significant improvement in mobility and quality of life   ? Baseline 2/28; 46%   ? Time 12   ? Period Weeks   ? Status New   ? Target Date 01/17/22   ?  ? PT LONG TERM GOAL #2  ? Title Patient will increase Berg Balance score by > 6 points (43/56)  to demonstrate decreased fall risk during functional activities.   ? Baseline 2/28: 37/56   ? Time 12   ? Period Weeks   ? Status New   ? Target Date 01/17/22   ?  ? PT LONG TERM GOAL #3  ? Title Patient will increase six minute walk test distance to >1300 for progression to community age norm ambulator and improve gait ability   ? Baseline 900 feet on 11/04/21   ? Time 12   ? Period Weeks   ? Status New   ? Target Date 01/17/22   ?  ? PT LONG TERM GOAL #4  ? Title Patient will increase BLE gross strength to 4+/5 as to improve functional strength for independent gait, increased standing tolerance and increased ADL ability.   ? Baseline 2/28: see note   ? Time 12   ? Period Weeks   ? Status New   ? Target Date 01/17/22   ? ?  ?  ? ?  ? ? ? ? ? ? ? ? Plan - 11/15/21 1439   ? ? Clinical Impression Statement Pt demonstrates excellent motivation for completion of PT program. Continued to incorporate interval training to ipmrove endurance with ambulation and other tasks.  Patient demonstrated improvement in ankle strategies with increased practice with dynamic balance tasks this session.  Patient tolerated these well but will continue to benefit from skilled training in this area. Patient will continue to benefit from skilled PT in order to improve her lower extremity strength, balance, fall risk, and improve her overall quality of life.   ? Personal Factors and Comorbidities Age;Comorbidity 3+;Fitness;Past/Current Experience;Social Background;Time since onset of injury/illness/exacerbation;Transportation   ?  Comorbidities anxiety, arthritis, CKD, colon polyps, COPD, DM, GERD, HOH, Kidney stones, osteoporosis, sleep apnea, venous stasis   ? Examination-Activity Limitations Bathing;Bed Mobility;Bend;Caring for

## 2021-11-16 ENCOUNTER — Ambulatory Visit (HOSPITAL_BASED_OUTPATIENT_CLINIC_OR_DEPARTMENT_OTHER)
Admission: RE | Admit: 2021-11-16 | Discharge: 2021-11-16 | Disposition: A | Discharge status: Home / Self Care | Payer: Medicare Other | Source: Ambulatory Visit | Attending: Internal Medicine | Admitting: Internal Medicine

## 2021-11-16 ENCOUNTER — Ambulatory Visit (HOSPITAL_COMMUNITY)
Admission: RE | Admit: 2021-11-16 | Discharge: 2021-11-16 | Disposition: A | Discharge status: Home / Self Care | Payer: Medicare Other | Source: Ambulatory Visit | Attending: Cardiology | Admitting: Cardiology

## 2021-11-16 ENCOUNTER — Ambulatory Visit (HOSPITAL_COMMUNITY)
Admission: RE | Admit: 2021-11-16 | Discharge: 2021-11-16 | Disposition: A | Discharge status: Home / Self Care | Payer: Medicare Other | Source: Ambulatory Visit | Attending: Internal Medicine | Admitting: Internal Medicine

## 2021-11-16 DIAGNOSIS — J439 Emphysema, unspecified: Secondary | ICD-10-CM | POA: Insufficient documentation

## 2021-11-16 DIAGNOSIS — I700 Atherosclerosis of aorta: Secondary | ICD-10-CM | POA: Diagnosis not present

## 2021-11-16 DIAGNOSIS — R931 Abnormal findings on diagnostic imaging of heart and coronary circulation: Secondary | ICD-10-CM | POA: Diagnosis not present

## 2021-11-16 DIAGNOSIS — I2510 Atherosclerotic heart disease of native coronary artery without angina pectoris: Secondary | ICD-10-CM | POA: Insufficient documentation

## 2021-11-16 MED ORDER — NITROGLYCERIN 0.4 MG SL SUBL
0.8000 mg | SUBLINGUAL_TABLET | Freq: Once | SUBLINGUAL | Status: AC
Start: 2021-11-16 — End: 2021-11-16
  Administered 2021-11-16: 0.8 mg via SUBLINGUAL

## 2021-11-16 MED ORDER — DIPHENHYDRAMINE HCL 25 MG PO CAPS
ORAL_CAPSULE | ORAL | Status: AC
Start: 2021-11-16 — End: 2021-11-16
  Administered 2021-11-16: 25 mg via ORAL
  Filled 2021-11-16: qty 1

## 2021-11-16 MED ORDER — NITROGLYCERIN 0.4 MG SL SUBL
SUBLINGUAL_TABLET | SUBLINGUAL | Status: AC
  Filled 2021-11-16: qty 2

## 2021-11-16 MED ORDER — DIPHENHYDRAMINE HCL 25 MG PO CAPS: 25.0000 mg | ORAL_CAPSULE | Freq: Four times a day (QID) | ORAL | Status: DC | PRN

## 2021-11-16 MED ORDER — IOHEXOL 350 MG/ML SOLN
100.0000 mL | Freq: Once | INTRAVENOUS | Status: AC | PRN
Start: 2021-11-16 — End: 2021-11-16
  Administered 2021-11-16: 100 mL via INTRAVENOUS

## 2021-11-16 NOTE — Progress Notes (Addendum)
PT states she is feeling better. Pt denies difficulty breathing or shortness of breath. Pt states her grandson is going to be driving her home. This RN to escort pt to car where grandson will be picking her up.  ?Pt denies feeling light headed or dizzy at time of discharge.  ?

## 2021-11-16 NOTE — Progress Notes (Signed)
Pt discharged at this time (12:23). Pt taken to the car where her grandson picked her up and is driving her home. Pt educated to monitor for s/s and return precautions/ when to seek medical attention, including worsening itching, shortness of breath/difficulty breathing and hives. Pt verbalized understanding and gave thanks for the information.  ?

## 2021-11-16 NOTE — Progress Notes (Signed)
Patient presented to [facility] Radiology department today for a CT heart. She has a history of contrast allergy and was pre-medicated with steroids/benadryl per protocol.  ? ?Following CT heart she developed mild pruritus and hives. The cardiologist was called and PO benadryl was ordered.  ? ?I assessed the patient in the nursing bay in IR and she states her symptoms are easing but she still has some mild diffuse pruritus. I was unable to see any hives. She denied any difficulty breathing or swallowing. I offered the patient IV benadryl for faster symptom relief but she preferred to just take oral benadryl.  ? ?25 mg PO benadryl administered by IR nurse. Patient will be observed for approximately 30 minutes and if she is feeling better will be discharged. Her grandson will provide transportation home.  ? Soyla Dryer, AGACNP-BC ?[phone] ?11/16/2021, 12:11 PM ? ? ?

## 2021-11-16 NOTE — Progress Notes (Signed)
After getting the CT contrast, pt stated she has itching on her arms. Dr. Gasper Sells notified and stated '25mg'$  PO, Roselyn Reef, PA/NP notified and is currently at bedside assessing patient. As per radiology group, pt to stay till 12:20. ?Pt states she feels a lot better compared to last time she had the IV contrast. Pt denies difficulty breathing or shortness of breath.  ?Pt sitting in chair with cardiac, bp and pulse ox monitor. Pt states history of COPD. ?Pt has water and pt provided with Oreos.  ?

## 2021-11-16 NOTE — Progress Notes (Addendum)
Pt came in for an outpatient CT scan today.  Pt had imaging of her heart performed with IV contrast.  Pt received 13hr steroid pre-medication for her contrast allergy.  After the CT scan was complete the patient began experiencing itching on both arms and had to be given benadryl.  ?

## 2021-11-17 ENCOUNTER — Telehealth: Payer: Self-pay

## 2021-11-17 ENCOUNTER — Ambulatory Visit (INDEPENDENT_AMBULATORY_CARE_PROVIDER_SITE_OTHER): Payer: Medicare Other | Admitting: Cardiology

## 2021-11-17 ENCOUNTER — Encounter: Payer: Self-pay | Admitting: Cardiology

## 2021-11-17 ENCOUNTER — Encounter: Payer: Self-pay | Admitting: Physical Therapy

## 2021-11-17 ENCOUNTER — Ambulatory Visit: Payer: Medicare Other | Admitting: Physical Therapy

## 2021-11-17 ENCOUNTER — Other Ambulatory Visit: Payer: Self-pay

## 2021-11-17 VITALS — BP 110/50 | HR 82 | Ht 66.0 in | Wt 196.0 lb

## 2021-11-17 DIAGNOSIS — R109 Unspecified abdominal pain: Secondary | ICD-10-CM | POA: Diagnosis not present

## 2021-11-17 DIAGNOSIS — M6281 Muscle weakness (generalized): Secondary | ICD-10-CM | POA: Diagnosis not present

## 2021-11-17 DIAGNOSIS — R269 Unspecified abnormalities of gait and mobility: Secondary | ICD-10-CM

## 2021-11-17 DIAGNOSIS — F17200 Nicotine dependence, unspecified, uncomplicated: Secondary | ICD-10-CM

## 2021-11-17 DIAGNOSIS — R2681 Unsteadiness on feet: Secondary | ICD-10-CM | POA: Diagnosis not present

## 2021-11-17 DIAGNOSIS — R0609 Other forms of dyspnea: Secondary | ICD-10-CM

## 2021-11-17 DIAGNOSIS — I2510 Atherosclerotic heart disease of native coronary artery without angina pectoris: Secondary | ICD-10-CM

## 2021-11-17 DIAGNOSIS — R2689 Other abnormalities of gait and mobility: Secondary | ICD-10-CM | POA: Diagnosis not present

## 2021-11-17 DIAGNOSIS — R600 Localized edema: Secondary | ICD-10-CM | POA: Diagnosis not present

## 2021-11-17 DIAGNOSIS — E7800 Pure hypercholesterolemia, unspecified: Secondary | ICD-10-CM

## 2021-11-17 DIAGNOSIS — R262 Difficulty in walking, not elsewhere classified: Secondary | ICD-10-CM

## 2021-11-17 MED ORDER — OMEPRAZOLE 40 MG PO CPDR: 40.0000 mg | DELAYED_RELEASE_CAPSULE | Freq: Every day | ORAL | Status: DC

## 2021-11-17 MED ORDER — TORSEMIDE 20 MG PO TABS: 10.0000 mg | ORAL_TABLET | Freq: Every day | ORAL | Status: DC

## 2021-11-17 MED ORDER — SIMETHICONE 80 MG PO TABS: 1.0000 | ORAL_TABLET | ORAL | 0 refills | Status: DC | PRN

## 2021-11-17 NOTE — Therapy (Signed)
[facility] ?[place], [place], [address] ?Phone: [phone]   Fax:  [phone] ? ?Physical Therapy Treatment ? ?Patient Details  ?Name: Amber Holder ?MRN: [id number] ?Date of Birth: [birth year] ?Referring Provider (PT): Drema Dallas PA ? ? ?Encounter Date: 11/17/2021 ? ? PT End of Session - 11/17/21 1153   ? ? Visit Number 6   ? Number of Visits 24   ? Date for PT Re-Evaluation 01/17/22   ? Authorization Type 1/10 eval 2/28   ? Equipment Utilized During Treatment Gait belt   ? Activity Tolerance Patient tolerated treatment well   ? Behavior During Therapy [facility] for tasks assessed/performed   ? ?  ?  ? ?  ? ? ?Past Medical History:  ?Diagnosis Date  ? Anxiety 01/02/2012  ? Arthritis   ? Asthma   ? Chronic kidney disease   ? Kidney stones  ? Colon polyps   ? COPD (chronic obstructive pulmonary disease) ([place])   ? Diabetes mellitus without complication ([place])   ? GERD (gastroesophageal reflux disease)   ? HOH (hard of hearing)   ? Bilateral hearing aids  ? Kidney stone   ? Osteoporosis   ? Sleep apnea   ? Does not use C-PAP on a regular basis  ? Venous stasis   ? ? ?Past Surgical History:  ?Procedure Laterality Date  ? APPENDECTOMY    ? cataract surgery Bilateral   ? CHOLECYSTECTOMY    ? COLONOSCOPY    ? COLONOSCOPY WITH PROPOFOL N/A 05/28/2015  ? Procedure: COLONOSCOPY WITH PROPOFOL;  Surgeon: Lollie Sails, MD;  [facility] END[facility]COPY;  Service: Endoscopy;  Laterality: N/A;  ? COLONOSCOPY WITH PROPOFOL N/A 10/28/2018  ? Procedure: COLONOSCOPY WITH PROPOFOL;  Surgeon: Lollie Sails, MD;  Location: [facility] ENDOSCOPY;  Service: Endoscopy;  Laterality: N/A;  ? EYE SURGERY Bilateral 2015  ? Cataract Extraction with IOL  ? LITHOTRIPSY  2015  ? has had many stones  ? Mastoidotomy  1970  ? ROTATOR CUFF REPAIR Right 2011  ? TOTAL HIP ARTHROPLASTY Right 06/14/2016  ? Procedure: TOTAL HIP ARTHROPLASTY;  Surgeon: Dereck Leep, MD;  Location: [facility] ORS;   Service: Orthopedics;  Laterality: Right;  ? ? ?There were no vitals filed for this visit. ? ? Subjective Assessment - 11/17/21 1151   ? ? Subjective Pt reports having to go in for cardiac procedure and discovering some "blockage they will have ot go in and clean out". Pt report her stomach is bothering her due to a medication and recovery from her procedure.   ? Pertinent History Patient is a 76 year old female who presents for LE weakness and balance deficits. PMH includes anxiety, arthritis, CKD, colon polyps, COPD, DM, GERD, HOH, Kidney stones, osteoporosis, sleep apnea, venous stasis. Lives alone in [place], [place]. She was originally from [place] but more recently from [place]. 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 She has three adult children who are healthy. Patient falls in the dark; had a fall at the beach last November as well. Patient has dizziness when driving on a bumpy rode. Limited in walking distance.   ? Limitations Lifting;Standing;Walking;House hold activities   ? How long can you stand comfortably? has difficulty washing dishes   ? How long can you walk comfortably? to mailbox   ? Patient Stated Goals to be able to walk; to improve balance   ? Pain Onset 1 to 4 weeks ago   ? ?  ?  ? ?  ? ? ?  Exercise/Activity Sets/ Reps/Time/ Resistance Assistance Charge type Comments- Unless otherwise stated, CGA was provided and gait belt donned in order to ensure pt safety ?  ?Nustep  Level 1 x 2 min, level 2 x 4 min  setup therex Cues for SPM > 60  ?3-4 RPE   ?Semitandem - theraband pad under anterior LE  2 x 45 sec  Neuro re-ed Increased difficulty with R LE posterior, cues for utilization of upper extremity support for transitional movements.  First arm patient had significant signs of instability but with second round patient demonstrated improved balance and tolerance for this activity  ?Marching on airex pad  10 x unilateral UE support ( 5 ea hand) Bilateral fingertip support  Neuro re-ed  Good use of ankle strategies throughout, cues to minimize grip with fingers   ?1 lower extremity on Airex pad 1 lower extremity on 6 inch step anteriorly 8 ball hand offs each side  Neuro reEd Good use of ankle strategies   ?Airex step up and over  X5 ea side  Progressively minimized UE support  NMR No LOB, good use of ankle strategies  ?STS airex pad under each LE  2 x 5 ea side   NMR To improve dynamic standing ability on unsteady and uneven surfaces  ?1/2 Foam roller rockers  2*15 B UE  There ex 3-4 RPE ?Cues for utilization of ankle strategies  ?LAQ  *20 w/ 4#AW  There ex    ?Airex side lunge   X 8 ea   There ex    ?      ?      ?Treatment provided this session ? ?Pt is HOH and utilization of general hand gestures were helpful with communication  ? ?Pt educated throughout session about proper posture and technique with exercises. Improved exercise technique, movement at target joints, use of target muscles after min to mod verbal, visual, tactile cues. ?Note: Portions of this document were prepared using Dragon voice recognition software and although reviewed may contain unintentional dictation errors in syntax, grammar, or spelling. ?  ? ? ? ? ? ? ? ? ? ? ? ? ? ? ? ? ? ? ? ? ? ? ? ? ? ? ? ? ? ? ? PT Education - 11/17/21 1152   ? ? Education Details exercise form and technique   ? ?  ?  ? ?  ? ? ? PT Short Term Goals - 10/25/21 1318   ? ?  ? PT SHORT TERM GOAL #1  ? Title Patient will be independent in home exercise program to improve strength/mobility for better functional independence with ADLs.   ? Baseline 2/28: HEP given   ? Time 4   ? Period Weeks   ? Status New   ? Target Date 11/22/21   ?  ? PT SHORT TERM GOAL #2  ? Title Patient (> 26 years old) will complete five times sit to stand test in < 15 seconds indicating an increased LE strength and improved balance.   ? Baseline 2/28: 18.24 seconds hands on knees   ? Time 4   ? Period Weeks   ? Status New   ? Target Date 11/22/21   ? ?  ?  ? ?  ? ? ? ? PT  Long Term Goals - 11/04/21 1107   ? ?  ? PT LONG TERM GOAL #1  ? Title Patient will increase FOTO score to equal to or greater than  51%  to demonstrate statistically significant improvement in mobility and quality of life   ? Baseline 2/28; 46%   ? Time 12   ? Period Weeks   ? Status New   ? Target Date 01/17/22   ?  ? PT LONG TERM GOAL #2  ? Title Patient will increase Berg Balance score by > 6 points (43/56)  to demonstrate decreased fall risk during functional activities.   ? Baseline 2/28: 37/56   ? Time 12   ? Period Weeks   ? Status New   ? Target Date 01/17/22   ?  ? PT LONG TERM GOAL #3  ? Title Patient will increase six minute walk test distance to >1300 for progression to community age norm ambulator and improve gait ability   ? Baseline 900 feet on 11/04/21   ? Time 12   ? Period Weeks   ? Status New   ? Target Date 01/17/22   ?  ? PT LONG TERM GOAL #4  ? Title Patient will increase BLE gross strength to 4+/5 as to improve functional strength for independent gait, increased standing tolerance and increased ADL ability.   ? Baseline 2/28: see note   ? Time 12   ? Period Weeks   ? Status New   ? Target Date 01/17/22   ? ?  ?  ? ?  ? ? ? ? ? ? ? ? Plan - 11/17/21 1153   ? ? Clinical Impression Statement Pt demonstrates excellent motivation for completion of PT program. Continued to incorporate interval training to ipmrove endurance with ambulation and other tasks. Patient demonstrated improvement in ankle strategies with increased practice with dynamic balance tasks this session. Patient tolerated these well but will continue to benefit from skilled training in this area. Patient will continue to benefit from skilled PT in order to improve her lower extremity strength, balance, fall risk, and improve her overall quality of life.   ? Personal Factors and Comorbidities Age;Comorbidity 3+;Fitness;Past/Current Experience;Social Background;Time since onset of injury/illness/exacerbation;Transportation   ?  Comorbidities anxiety, arthritis, CKD, colon polyps, COPD, DM, GERD, HOH, Kidney stones, osteoporosis, sleep apnea, venous stasis   ? Examination-Activity Limitations Bathing;Bed Mobility;Bend;Caring for Other

## 2021-11-17 NOTE — Telephone Encounter (Signed)
Spoke with patient and discussed CCTA result. Dr. Garen Lah added patient to his schedule for 2:00 today. Patient was very grateful.  ?

## 2021-11-17 NOTE — Progress Notes (Signed)
?Cardiology Office Note:   ? ?Date:  11/17/2021  ? ?ID:  Amber Holder, DOB 08-09-1946, MRN [id number] ? ?PCP:  Lavera Guise, MD ?  ?[facility] Providers ?Cardiologist:  Kate Sable, MD    ? ?Referring MD: Lavera Guise, MD  ? ?Chief Complaint  ?Patient presents with  ? office visit-discuss possible cardiac cath  ? ? ?History of Present Illness:   ? ?Amber Holder is a 76 y.o. female with a hx of hyperlipidemia, diabetes, COPD, current smoker x40+ years, PAD/carotid stenosis who presents for follow-up.  Patient previously seen due to shortness of breath and leg edema. ? ?Due to significant risk factors, coronary CTA was obtained to evaluate presence of CAD.  Also monitor also started for lymphedema.  Echocardiogram was ordered currently pending.  Patient underwent coronary CTA yesterday, results reviewed by myself showed severe stenosis in the proximal RCA, calcium score 790.  Calcified plaques in the left circumflex and LAD causing nonobstructive disease. ? ?She still has shortness of breath with exertion, she still smokes.  Torsemide was started due to lower extremity edema.  Torsemide has helped with edema, although patient complains of dry mouth. ? ?Prior notes ?Coronary CTA 10/2021 calcium score 790, CTO proximal RCA, nonobstructive LAD and left circumflex disease ?Outside echocardiogram 2021 showed normal systolic function, diastolic dysfunction EF 26% ?Chest CT 06/2016 showed coronary artery calcification ? ?Past Medical History:  ?Diagnosis Date  ? Anxiety 01/02/2012  ? Arthritis   ? Asthma   ? Chronic kidney disease   ? Kidney stones  ? Colon polyps   ? COPD (chronic obstructive pulmonary disease) ([place])   ? Diabetes mellitus without complication ([place])   ? GERD (gastroesophageal reflux disease)   ? HOH (hard of hearing)   ? Bilateral hearing aids  ? Kidney stone   ? Osteoporosis   ? Sleep apnea   ? Does not use C-PAP on a regular basis  ? Venous stasis   ? ? ?Past Surgical History:  ?Procedure  Laterality Date  ? APPENDECTOMY    ? cataract surgery Bilateral   ? CHOLECYSTECTOMY    ? COLONOSCOPY    ? COLONOSCOPY WITH PROPOFOL N/A 05/28/2015  ? Procedure: COLONOSCOPY WITH PROPOFOL;  Surgeon: Lollie Sails, MD;  Location: [facility] ENDOSCOPY;  Service: Endoscopy;  Laterality: N/A;  ? COLONOSCOPY WITH PROPOFOL N/A 10/28/2018  ? Procedure: COLONOSCOPY WITH PROPOFOL;  Surgeon: Lollie Sails, MD;  Location: [facility] ENDOSCOPY;  Service: Endoscopy;  Laterality: N/A;  ? EYE SURGERY Bilateral 2015  ? Cataract Extraction with IOL  ? LITHOTRIPSY  2015  ? has had many stones  ? Mastoidotomy  1970  ? ROTATOR CUFF REPAIR Right 2011  ? TOTAL HIP ARTHROPLASTY Right 06/14/2016  ? Procedure: TOTAL HIP ARTHROPLASTY;  Surgeon: Dereck Leep, MD;  Location: [facility] ORS;  Service: Orthopedics;  Laterality: Right;  ? ? ?Current Medications: ?Current Meds  ?Medication Sig  ? acetaminophen (TYLENOL) 500 MG chewable tablet Chew 1,000 mg by mouth every 8 (eight) hours as needed for pain.  ? albuterol (VENTOLIN HFA) 108 (90 Base) MCG/ACT inhaler Inhale 2 puffs into the lungs every 6 (six) hours as needed for wheezing or shortness of breath.  ? aspirin EC 81 MG tablet Take 1 tablet (81 mg total) by mouth daily. Swallow whole.  ? atorvastatin (LIPITOR) 40 MG tablet Take 1 tablet (40 mg total) by mouth daily.  ? cholecalciferol (VITAMIN D) 400 UNITS TABS tablet Take 2,000 Units by mouth daily.   ?  ergocalciferol (DRISDOL) 1.25 MG (50000 UT) capsule Take one cap q week  ? fluticasone (FLOVENT HFA) 110 MCG/ACT inhaler Inhale 2 puffs into the lungs 2 (two) times daily as needed.  ? glucose blood (ONETOUCH VERIO) test strip BLOOD SUGAR TESTING ONCE DAILY . DX E11.65  ? hydrocortisone cream 0.5 % Apply topically as needed.   ? ibandronate (BONIVA) 150 MG tablet Take 1 tablet (150 mg total) by mouth every 30 (thirty) days.  ? Lancets (ONETOUCH DELICA PLUS WEXHBZ16R) MISC Use  as directed twice a daily DX E11.65  ? Magnesium 250 MG TABS Take by  mouth. Takes 1 tablet 2-3 times per week  ? montelukast (SINGULAIR) 10 MG tablet Take 10 mg by mouth daily as needed.  ? Multiple Vitamins-Minerals (MULTIVITAMIN ADULTS PO) Take 1 tablet by mouth daily.  ? potassium chloride (KLOR-CON) 8 MEQ tablet TAKE 1 TABLET (8 MEQ TOTAL) BY MOUTH AS NEEDED (ONLY TAKE WHEN TAKING LASIX).  ? Simethicone 80 MG TABS Take 1 tablet (80 mg total) by mouth as needed (burping). As directed.  ? SPIRIVA HANDIHALER 18 MCG inhalation capsule PLACE 1 CAPSULE (18 MCG TOTAL) INTO INHALER AND INHALE DAILY. FOR COPD *DO NOT SWALLOW  ? [DISCONTINUED] omeprazole (PRILOSEC) 40 MG capsule Take 40 mg by mouth daily as needed.  ?  ? ?Allergies:   Iodinated contrast media, Shellfish allergy, Sulfa antibiotics, Sulfasalazine, Percocet [oxycodone-acetaminophen], and Iodine  ? ?Social History  ? ?Socioeconomic History  ? Marital status: Divorced  ?  Spouse name: Not on file  ? Number of children: Not on file  ? Years of education: Not on file  ? Highest education level: Not on file  ?Occupational History  ? Not on file  ?Tobacco Use  ? Smoking status: Every Day  ?  Packs/day: 1.00  ?  Types: Cigarettes  ? Smokeless tobacco: Never  ? Tobacco comments:  ?  1 pack daily  ?Vaping Use  ? Vaping Use: Never used  ?Substance and Sexual Activity  ? Alcohol use: Yes  ?  Comment: very rarely   ? Drug use: No  ? Sexual activity: Not Currently  ?Other Topics Concern  ? Not on file  ?Social History Narrative  ? Not on file  ? ?Social Determinants of Health  ? ?Financial Resource Strain: Not on file  ?Food Insecurity: Not on file  ?Transportation Needs: Not on file  ?Physical Activity: Not on file  ?Stress: Not on file  ?Social Connections: Not on file  ?  ? ?Family History: ?The patient's family history includes Breast cancer (age of onset: 65) in her maternal aunt. There is no history of Bladder Cancer or Kidney cancer. ? ?ROS:   ?Please see the history of present illness.    ? All other systems reviewed and are  negative. ? ?EKGs/Labs/Other Studies Reviewed:   ? ?The following studies were reviewed today: ? ? ?EKG:  EKG not  ordered today.   ? ?Recent Labs: ?12/24/2020: Hemoglobin 16.1; Platelets 238; TSH 1.770 ?06/13/2021: ALT 16 ?11/09/2021: BUN 19; Creatinine, Ser 0.83; Potassium 4.0; Sodium 141  ?Recent Lipid Panel ?   ?Component Value Date/Time  ? CHOL 239 (H) 12/24/2020 1154  ? TRIG 92 12/24/2020 1154  ? HDL 55 12/24/2020 1154  ? [place] 168 (H) 12/24/2020 1154  ? ? ? ?Risk Assessment/Calculations:   ? ? ?    ? ?Physical Exam:   ? ?VS:  BP (!) 110/50 (BP Location: Left Arm, Patient Position: Sitting, Cuff Size: Large)  Pulse 82   Ht '5\' 6"'$  (1.676 m)   Wt 196 lb (88.9 kg)   SpO2 95%   BMI 31.64 kg/m?    ? ?Wt Readings from Last 3 Encounters:  ?11/17/21 196 lb (88.9 kg)  ?11/11/21 190 lb 9.6 oz (86.5 kg)  ?11/03/21 196 lb (88.9 kg)  ?  ? ?GEN:  Well nourished, well developed in no acute distress ?HEENT: Normal ?NECK: No JVD; No carotid bruits ?CARDIAC: RRR, no murmurs, rubs, gallops ?RESPIRATORY: Decreased breath sounds, no wheezing. ?ABDOMEN: Soft, non-tender, non-distended ?MUSCULOSKELETAL:  1+ edema; No deformity  ?SKIN: Warm and dry ?NEUROLOGIC:  Alert and oriented x 3 ?PSYCHIATRIC:  Normal affect  ? ?ASSESSMENT:   ? ?1. Coronary artery disease involving native coronary artery of native heart, unspecified whether angina present   ?2. Dyspnea on exertion   ?3. Pure hypercholesterolemia   ?4. Smoking   ?5. Abdominal discomfort   ?6. Leg edema   ? ? ?PLAN:   ? ?In order of problems listed above: ? ?CAD, CTO RCA.  Calcium score 790.  Denies chest pain.  Continue aspirin 81 mg daily, Lipitor 40 mg daily.  Get echocardiogram as scheduled.  If EF is normal, will likely not plan left heart cath especially as patient has iodine allergies. ?Dyspnea on exertion, COPD, OSA, current smoking likely contributing to shortness of breath.  Deconditioning likely contributing. ?Hyperlipidemia, start Lipitor 40 mg daily. ?Current  smoker, cessation recommended. ?Abdominal discomfort, patient feels gassy.  Advised to take PPI daily, simethicone as needed. ?Leg edema bilaterally, this could be secondary to venous insufficiency, complains of dry mouth sin

## 2021-11-17 NOTE — Telephone Encounter (Signed)
Called patient and left a message at both home and cell phone number requesting a call back. ?

## 2021-11-17 NOTE — Telephone Encounter (Signed)
Patient returning call.

## 2021-11-17 NOTE — Telephone Encounter (Signed)
-----   Message from Kate Sable, MD sent at 11/16/2021  5:07 PM EDT ----- ?I personally reviewed patient's coronary CTA.  There is significant stenosis in the proximal RCA.  Please schedule follow-up appointment with myself, patient will likely need a left heart cath.  Thank you ?

## 2021-11-17 NOTE — Patient Instructions (Signed)
Medication Instructions:  ? ?Your physician has recommended you make the following change in your medication:  ? ?START taking Simethicone AS NEEDED for burping - You may purchase this over-the-counter  ? ?DECREASE Torsemide 10 mg daily - You may cut current tablet in half  ? ?CHANGE Omeprazole 40 mg to daily ? ?*If you need a refill on your cardiac medications before your next appointment, please call your pharmacy* ? ? ?Lab Work: ? ?None ordered ? ?Testing/Procedures: ? ?Echo as scheduled ? ? ?Follow-Up: ?At [facility], you and your health needs are our priority.  As part of our continuing mission to provide you with exceptional heart care, we have created designated Provider Care Teams.  These Care Teams include your primary Cardiologist (physician) and Advanced Practice Providers (APPs -  Physician Assistants and Nurse Practitioners) who all work together to provide you with the care you need, when you need it. ? ?We recommend signing up for the patient portal called "MyChart".  Sign up information is provided on this After Visit Summary.  MyChart is used to connect with patients for Virtual Visits (Telemedicine).  Patients are able to view lab/test results, encounter notes, upcoming appointments, etc.  Non-urgent messages can be sent to your provider as well.   ?To learn more about what you can do with MyChart, go to [email or web address].   ? ?Your next appointment:   ? ?Keep follow up as scheduled ? ?

## 2021-11-19 ENCOUNTER — Encounter: Payer: Self-pay | Admitting: Nurse Practitioner

## 2021-11-21 ENCOUNTER — Other Ambulatory Visit: Payer: Self-pay

## 2021-11-21 ENCOUNTER — Ambulatory Visit: Payer: Medicare Other

## 2021-11-21 DIAGNOSIS — R269 Unspecified abnormalities of gait and mobility: Secondary | ICD-10-CM | POA: Diagnosis not present

## 2021-11-21 DIAGNOSIS — R262 Difficulty in walking, not elsewhere classified: Secondary | ICD-10-CM | POA: Diagnosis not present

## 2021-11-21 DIAGNOSIS — M6281 Muscle weakness (generalized): Secondary | ICD-10-CM

## 2021-11-21 DIAGNOSIS — R2689 Other abnormalities of gait and mobility: Secondary | ICD-10-CM | POA: Diagnosis not present

## 2021-11-21 DIAGNOSIS — R2681 Unsteadiness on feet: Secondary | ICD-10-CM | POA: Diagnosis not present

## 2021-11-21 NOTE — Therapy (Signed)
[place] [facility] ?[place], [place], [address] ?Phone: [phone]   Fax:  [phone] ? ?Physical Therapy Treatment ? ?Patient Details  ?Name: Amber Holder ?MRN: [id number] ?Date of Birth: 11-10-1945 ?Referring Provider (PT): Drema Dallas PA ? ? ?Encounter Date: 11/21/2021 ? ? PT End of Session - 11/21/21 1325   ? ? Visit Number 7   ? Number of Visits 24   ? Date for PT Re-Evaluation 01/17/22   ? Authorization Type 1/10 eval 2/28   ? PT Start Time 1148   ? PT Stop Time 1230   ? PT Time Calculation (min) 42 min   ? Equipment Utilized During Treatment Gait belt   ? Activity Tolerance Patient tolerated treatment well   ? Behavior During Therapy [facility] for tasks assessed/performed   ? ?  ?  ? ?  ? ? ?Past Medical History:  ?Diagnosis Date  ? Anxiety 01/02/2012  ? Arthritis   ? Asthma   ? Chronic kidney disease   ? Kidney stones  ? Colon polyps   ? COPD (chronic obstructive pulmonary disease) ([place])   ? Diabetes mellitus without complication ([place])   ? GERD (gastroesophageal reflux disease)   ? HOH (hard of hearing)   ? Bilateral hearing aids  ? Kidney stone   ? Osteoporosis   ? Sleep apnea   ? Does not use C-PAP on a regular basis  ? Venous stasis   ? ? ?Past Surgical History:  ?Procedure Laterality Date  ? APPENDECTOMY    ? cataract surgery Bilateral   ? CHOLECYSTECTOMY    ? COLONOSCOPY    ? COLONOSCOPY WITH PROPOFOL N/A 05/28/2015  ? Procedure: COLONOSCOPY WITH PROPOFOL;  Surgeon: Lollie Sails, MD;  Location: [facility] ENDOSCOPY;  Service: Endoscopy;  Laterality: N/A;  ? COLONOSCOPY WITH PROPOFOL N/A 10/28/2018  ? Procedure: COLONOSCOPY WITH PROPOFOL;  Surgeon: Lollie Sails, MD;  Location: [facility] ENDOSCOPY;  Service: Endoscopy;  Laterality: N/A;  ? EYE SURGERY Bilateral 2015  ? Cataract Extraction with IOL  ? LITHOTRIPSY  2015  ? has had many stones  ? Mastoidotomy  1970  ? ROTATOR CUFF REPAIR Right 2011  ? TOTAL HIP ARTHROPLASTY Right 06/14/2016  ?  Procedure: TOTAL HIP ARTHROPLASTY;  Surgeon: Dereck Leep, MD;  Location: [facility] ORS;  Service: Orthopedics;  Laterality: Right;  ? ? ?There were no vitals filed for this visit. ? ? Subjective Assessment - 11/21/21 1155   ? ? Subjective Patient reports feeling okay- Stuffy feeling on her left side of head- patient thinks may be allergies.   ? Pertinent History Patient is a 76 year old female who presents for LE weakness and balance deficits. PMH includes anxiety, arthritis, CKD, colon polyps, COPD, DM, GERD, HOH, Kidney stones, osteoporosis, sleep apnea, venous stasis. Lives alone in [place], [place]. She was originally from [place] but more recently from [place]. 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 She has three adult children who are healthy. Patient falls in the dark; had a fall at the beach last November as well. Patient has dizziness when driving on a bumpy rode. Limited in walking distance.   ? Limitations Lifting;Standing;Walking;House hold activities   ? How long can you stand comfortably? has difficulty washing dishes   ? How long can you walk comfortably? to mailbox   ? Patient Stated Goals to be able to walk; to improve balance   ? Pain Onset 1 to 4 weeks ago   ? ?  ?  ? ?  ? ? ?  INTERVENTIONS:  ? ?  ?Exercise/Activity Sets/ Reps/Time/ Resistance Assistance Charge type Comments- Unless otherwise stated, CGA was provided and gait belt donned in order to ensure pt safety ?   ?Nustep  Level 1 x 3 min, level 2 x 3 min  setup therex Cues for SPM > 60  ?4/10 RPE   ?1/2 foam side step up and over  X10 ea side  ?2.5 lb AW No UE Support NMR No LOB, mild unsteadiness- VC to take a wide step  ?STS airex pad under one LE  10 reps ea side    NMR Much more efficient with pad under right LE but able to complete desired reps on each  ?Soccer kick against wall BLE   3 min CGA by PT NMR Initially concentrated on step/plant on weaker right LE and kick with left but after 1.5 min instructed to just kick with  either leg as appropriate.   ?LAQ  *20 w/ 2.5 #AW   There ex   Patient reported as "Fairly easy."  ? 1/2/foam forward/backward step up and over  X 10 each with 2.5lb AW  No UE support   NMR    ? Step ups- using 4" step in // bars  x 10 reps alt LE 2.5 lb  No UE support  Therex    ?  ?Pt is HOH and utilization of general hand gestures were helpful with communication  ?  ?Pt educated throughout session about proper posture and technique with exercises. Improved exercise technique, movement at target joints, use of target muscles after min to mod verbal, visual, tactile cues. ?Note: Portions of this document were prepared using Dragon voice recognition software and although reviewed may contain unintentional dictation errors in syntax, grammar, or spelling. ? ? ? ? ? ? ? ? ? ? ? ? ? ? ? ? ? ? ? ? ? ? ? ? ? ? ? ? PT Short Term Goals - 10/25/21 1318   ? ?  ? PT SHORT TERM GOAL #1  ? Title Patient will be independent in home exercise program to improve strength/mobility for better functional independence with ADLs.   ? Baseline 2/28: HEP given   ? Time 4   ? Period Weeks   ? Status New   ? Target Date 11/22/21   ?  ? PT SHORT TERM GOAL #2  ? Title Patient (> 36 years old) will complete five times sit to stand test in < 15 seconds indicating an increased LE strength and improved balance.   ? Baseline 2/28: 18.24 seconds hands on knees   ? Time 4   ? Period Weeks   ? Status New   ? Target Date 11/22/21   ? ?  ?  ? ?  ? ? ? ? PT Long Term Goals - 11/04/21 1107   ? ?  ? PT LONG TERM GOAL #1  ? Title Patient will increase FOTO score to equal to or greater than  51%   to demonstrate statistically significant improvement in mobility and quality of life   ? Baseline 2/28; 46%   ? Time 12   ? Period Weeks   ? Status New   ? Target Date 01/17/22   ?  ? PT LONG TERM GOAL #2  ? Title Patient will increase Berg Balance score by > 6 points (43/56)  to demonstrate decreased fall risk during functional activities.   ? Baseline 2/28:  37/56   ? Time 12   ?  Period Weeks   ? Status New   ? Target Date 01/17/22   ?  ? PT LONG TERM GOAL #3  ? Title Patient will increase six minute walk test distance to >1300 for progression to community age norm ambulator and improve gait ability   ? Baseline 900 feet on 11/04/21   ? Time 12   ? Period Weeks   ? Status New   ? Target Date 01/17/22   ?  ? PT LONG TERM GOAL #4  ? Title Patient will increase BLE gross strength to 4+/5 as to improve functional strength for independent gait, increased standing tolerance and increased ADL ability.   ? Baseline 2/28: see note   ? Time 12   ? Period Weeks   ? Status New   ? Target Date 01/17/22   ? ?  ?  ? ?  ? ? ? ? ? ? ? ? Plan - 11/21/21 1303   ? ? Clinical Impression Statement Patient presented with good motivation today during today's session and no complaint of pain. She was challenged with weight bearing/shifting activities focusing on her Right LE and performed well including step over 1/2 foam and step ups and later during soccer activity. Patient will continue to benefit from skilled PT in order to improve her lower extremity strength, balance, fall risk, and improve her overall quality of life.   ? Personal Factors and Comorbidities Age;Comorbidity 3+;Fitness;Past/Current Experience;Social Background;Time since onset of injury/illness/exacerbation;Transportation   ? Comorbidities anxiety, arthritis, CKD, colon polyps, COPD, DM, GERD, HOH, Kidney stones, osteoporosis, sleep apnea, venous stasis   ? Examination-Activity Limitations Bathing;Bed Mobility;Bend;Caring for Others;Carry;Locomotion Level;Lift;Hygiene/Grooming;Dressing;Reach Overhead;Sleep;Squat;Transfers;Toileting;Stand;Stairs   ? Examination-Participation Restrictions Church;Cleaning;Community Activity;Driving;Meal Prep;Laundry;Interpersonal Relationship;Personal Finances;Shop;Volunteer;[place] [employer] Work   ? Stability/Clinical Decision Making Evolving/Moderate complexity   ? Rehab Potential Fair   ? PT Frequency  2x / week   ? PT Duration 12 weeks   ? PT Treatment/Interventions ADLs/Self Care Home Management;Cryotherapy;Electrical Stimulation;Canalith Repostioning;Moist Heat;Iontophoresis '4mg'$ /ml Dexamethasone;Traction;DM

## 2021-11-23 ENCOUNTER — Ambulatory Visit (INDEPENDENT_AMBULATORY_CARE_PROVIDER_SITE_OTHER): Payer: Medicare Other

## 2021-11-23 ENCOUNTER — Other Ambulatory Visit: Payer: Self-pay

## 2021-11-23 DIAGNOSIS — R0609 Other forms of dyspnea: Secondary | ICD-10-CM

## 2021-11-24 ENCOUNTER — Encounter: Payer: Self-pay | Admitting: Physical Therapy

## 2021-11-24 ENCOUNTER — Ambulatory Visit: Payer: Medicare Other | Admitting: Physical Therapy

## 2021-11-24 DIAGNOSIS — R269 Unspecified abnormalities of gait and mobility: Secondary | ICD-10-CM | POA: Diagnosis not present

## 2021-11-24 DIAGNOSIS — M6281 Muscle weakness (generalized): Secondary | ICD-10-CM | POA: Diagnosis not present

## 2021-11-24 DIAGNOSIS — R2689 Other abnormalities of gait and mobility: Secondary | ICD-10-CM | POA: Diagnosis not present

## 2021-11-24 DIAGNOSIS — R262 Difficulty in walking, not elsewhere classified: Secondary | ICD-10-CM | POA: Diagnosis not present

## 2021-11-24 DIAGNOSIS — R2681 Unsteadiness on feet: Secondary | ICD-10-CM

## 2021-11-24 NOTE — Therapy (Signed)
[facility] [facility] ?[place], [place], [address] ?Phone: [phone]   Fax:  [phone] ? ?Physical Therapy Treatment ? ?Patient Details  ?Name: Amber Holder ?MRN: [id number] ?Date of Birth: 08/05/1946 ?Referring Provider (PT): Drema Dallas PA ? ? ?Encounter Date: 11/24/2021 ? ? PT End of Session - 11/24/21 1351   ? ? Visit Number 8   ? Number of Visits 24   ? Date for PT Re-Evaluation 01/17/22   ? Authorization Type 1/10 eval 2/28   ? PT Start Time 0930   ? PT Stop Time 1015   ? PT Time Calculation (min) 45 min   ? Equipment Utilized During Treatment Gait belt   ? Activity Tolerance Patient tolerated treatment well   ? Behavior During Therapy [facility] for tasks assessed/performed   ? ?  ?  ? ?  ? ? ?Past Medical History:  ?Diagnosis Date  ? Anxiety 01/02/2012  ? Arthritis   ? Asthma   ? Chronic kidney disease   ? Kidney stones  ? Colon polyps   ? COPD (chronic obstructive pulmonary disease) ([place])   ? Diabetes mellitus without complication ([place])   ? GERD (gastroesophageal reflux disease)   ? HOH (hard of hearing)   ? Bilateral hearing aids  ? Kidney stone   ? Osteoporosis   ? Sleep apnea   ? Does not use C-PAP on a regular basis  ? Venous stasis   ? ? ?Past Surgical History:  ?Procedure Laterality Date  ? APPENDECTOMY    ? cataract surgery Bilateral   ? CHOLECYSTECTOMY    ? COLONOSCOPY    ? COLONOSCOPY WITH PROPOFOL N/A 05/28/2015  ? Procedure: COLONOSCOPY WITH PROPOFOL;  Surgeon: Lollie Sails, MD;  Location: [facility] ENDOSCOPY;  Service: Endoscopy;  Laterality: N/A;  ? COLONOSCOPY WITH PROPOFOL N/A 10/28/2018  ? Procedure: COLONOSCOPY WITH PROPOFOL;  Surgeon: Lollie Sails, MD;  Location[facility] ENDOSCOPY;  Service: Endoscopy;  Laterality: N/A;  ? EYE SURGERY Bilateral 2015  ? Cataract Extraction with IOL  ? LITHOTRIPSY  2015  ? has had many stones  ? Mastoidotomy  1970  ? ROTATOR CUFF REPAIR Right 2011  ? TOTAL HIP ARTHROPLASTY Right 06/14/2016  ?  Procedure: TOTAL HIP ARTHROPLASTY;  Surgeon: Dereck Leep, MD;  Location: [facility] ORS;  Service: Orthopedics;  Laterality: Right;  ? ? ?There were no vitals filed for this visit. ? ? Subjective Assessment - 11/24/21 0930   ? ? Subjective Patient reports doing well.  Patient does report some pain in her right knee following previous physical therapy session thinks it could be due to stepping exercises she completed in high-volume.  Patient also reports she is attempting to quit smoking and was highly encouraged to continue this as this will help her in recovery from illnesses as well as improve her overall health.   ? Pertinent History Patient is a 76 year old female who presents for LE weakness and balance deficits. PMH includes anxiety, arthritis, CKD, colon polyps, COPD, DM, GERD, HOH, Kidney stones, osteoporosis, sleep apnea, venous stasis. Lives alone in [place], [place]. She was originally from [place] but more recently from [place]. 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 She has three adult children who are healthy. Patient falls in the dark; had a fall at the beach last November as well. Patient has dizziness when driving on a bumpy rode. Limited in walking distance.   ? Limitations Lifting;Standing;Walking;House hold activities   ?  How long can you stand comfortably? has difficulty washing dishes   ? How long can you walk comfortably? to mailbox   ? Patient Stated Goals to be able to walk; to improve balance   ? Currently in Pain? Yes   ? Pain Score 5    ? Pain Location Leg   ? Pain Orientation Right   ? Pain Descriptors / Indicators Aching   ? Pain Type Chronic pain   ? Pain Onset 1 to 4 weeks ago   ? ?  ?  ? ?  ? ? ? ?INTERVENTIONS:  ? ?  ?Exercise/Activity Sets/ Reps/Time/ Resistance Assistance Charge type Comments- Unless otherwise stated, CGA was provided and gait belt donned in order to ensure pt safety ?   ?Nustep  Level 1 x 1 min, level 2 x 3 min level 3 x 2 min  setup therex Cues for SPM  > 60  ?  ?Airex side step up x 1 airex each side  X 10 ea  Cga- Min A with LOB  NMR 1 LOB noted and corrected with minA from PT and cue for ant weight shift ( Lob was posterior)   ?STS airex pad under one LE  2*5 reps ea side    NMR Much more efficient with pad under right LE but able to complete desired reps on each  ?      ?LAQ  *20 w/ 3#AW   There ex   Patient reported as "Fairly easy."  ?Rocker Board  3*10  NMR Intermittent decrease in upper extremity support.  Cues for utilization of ankle strategies in comparison to hip strategies  ?Heel-to-toe raises X10 each  NMR Q tubulation of ankle strategies and instruction in addition of this to her home exercise program.  ?  ?Pt is HOH and utilization of general hand gestures were helpful with communication  ?  ?Pt educated throughout session about proper posture and technique with exercises. Improved exercise technique, movement at target joints, use of target muscles after min to mod verbal, visual, tactile cues. ?Note: Portions of this document were prepared using Dragon voice recognition software and although reviewed may contain unintentional dictation errors in syntax, grammar, or spelling. ? ? ? ? ? ? ? ? ? ? ? ? ? ? ? ? ? ? ? ? ? ? ? ? ? ? PT Education - 11/24/21 1350   ? ? Education Details Exercise form and technique   ? Person(s) Educated Patient   ? Methods Explanation   ? Comprehension Verbalized understanding   ? ?  ?  ? ?  ? ? ? PT Short Term Goals - 10/25/21 1318   ? ?  ? PT SHORT TERM GOAL #1  ? Title Patient will be independent in home exercise program to improve strength/mobility for better functional independence with ADLs.   ? Baseline 2/28: HEP given   ? Time 4   ? Period Weeks   ? Status New   ? Target Date 11/22/21   ?  ? PT SHORT TERM GOAL #2  ? Title Patient (> 50 years old) will complete five times sit to stand test in < 15 seconds indicating an increased LE strength and improved balance.   ? Baseline 2/28: 18.24 seconds hands on knees   ?  Time 4   ? Period Weeks   ? Status New   ? Target Date 11/22/21   ? ?  ?  ? ?  ? ? ? ? PT  Long Term Goals - 11/04/21 1107   ? ?  ? PT LONG TERM GOAL #1  ? Title Patient will increase FOTO score to equal to or greater than  51%   to demonstrate statistically significant improvement in mobility and quality of life   ? Baseline 2/28; 46%   ? Time 12   ? Period Weeks   ? Status New   ? Target Date 01/17/22   ?  ? PT LONG TERM GOAL #2  ? Title Patient will increase Berg Balance score by > 6 points (43/56)  to demonstrate decreased fall risk during functional activities.   ? Baseline 2/28: 37/56   ? Time 12   ? Period Weeks   ? Status New   ? Target Date 01/17/22   ?  ? PT LONG TERM GOAL #3  ? Title Patient will increase six minute walk test distance to >1300 for progression to community age norm ambulator and improve gait ability   ? Baseline 900 feet on 11/04/21   ? Time 12   ? Period Weeks   ? Status New   ? Target Date 01/17/22   ?  ? PT LONG TERM GOAL #4  ? Title Patient will increase BLE gross strength to 4+/5 as to improve functional strength for independent gait, increased standing tolerance and increased ADL ability.   ? Baseline 2/28: see note   ? Time 12   ? Period Weeks   ? Status New   ? Target Date 01/17/22   ? ?  ?  ? ?  ? ? ? ? ? ? ? ? Plan - 11/24/21 1351   ? ? Clinical Impression Statement Patient presents with good motivation for completion of physical therapy program.  Patient did have some complaints of knee pain following her last session so we avoided many stepping activities that cause higher impact on the knee.  Patient did not report any increase in knee pain throughout session and demonstrated improved balance responses with many high-level balance activities performed this session.  We will continue to benefit from skilled physical therapy intervention in order to improve her lower extremity strength, balance, reduce her fall risk, and improve her overall quality of life.   ? Personal Factors  and Comorbidities Age;Comorbidity 3+;Fitness;Past/Current Experience;Social Background;Time since onset of injury/illness/exacerbation;Transportation   ? Comorbidities anxiety, arthritis, CKD, colon polyps, C

## 2021-11-25 LAB — ECHOCARDIOGRAM COMPLETE
AR max vel: 2.6 cm2
AV Area VTI: 2.82 cm2
AV Area mean vel: 2.44 cm2
AV Mean grad: 3 mmHg
AV Peak grad: 5.6 mmHg
Ao pk vel: 1.18 m/s
Area-P 1/2: 2.03 cm2
Calc EF: 66.7 %
S' Lateral: 2.5 cm
Single Plane A2C EF: 72.9 %
Single Plane A4C EF: 62.4 %

## 2021-11-28 ENCOUNTER — Encounter: Payer: Self-pay | Admitting: Internal Medicine

## 2021-11-28 ENCOUNTER — Ambulatory Visit (INDEPENDENT_AMBULATORY_CARE_PROVIDER_SITE_OTHER): Payer: Medicare Other | Admitting: Internal Medicine

## 2021-11-28 ENCOUNTER — Encounter: Payer: Self-pay | Admitting: Physical Therapy

## 2021-11-28 ENCOUNTER — Ambulatory Visit: Payer: Medicare Other | Attending: Physician Assistant | Admitting: Physical Therapy

## 2021-11-28 VITALS — BP 119/73 | HR 72 | Temp 98.3°F | Resp 16 | Ht 66.0 in | Wt 195.0 lb

## 2021-11-28 DIAGNOSIS — Z6831 Body mass index (BMI) 31.0-31.9, adult: Secondary | ICD-10-CM | POA: Diagnosis not present

## 2021-11-28 DIAGNOSIS — R2689 Other abnormalities of gait and mobility: Secondary | ICD-10-CM | POA: Diagnosis not present

## 2021-11-28 DIAGNOSIS — R2681 Unsteadiness on feet: Secondary | ICD-10-CM | POA: Diagnosis not present

## 2021-11-28 DIAGNOSIS — R262 Difficulty in walking, not elsewhere classified: Secondary | ICD-10-CM | POA: Diagnosis not present

## 2021-11-28 DIAGNOSIS — I2584 Coronary atherosclerosis due to calcified coronary lesion: Secondary | ICD-10-CM

## 2021-11-28 DIAGNOSIS — I2510 Atherosclerotic heart disease of native coronary artery without angina pectoris: Secondary | ICD-10-CM | POA: Diagnosis not present

## 2021-11-28 DIAGNOSIS — R0602 Shortness of breath: Secondary | ICD-10-CM | POA: Diagnosis not present

## 2021-11-28 DIAGNOSIS — F17210 Nicotine dependence, cigarettes, uncomplicated: Secondary | ICD-10-CM | POA: Diagnosis not present

## 2021-11-28 DIAGNOSIS — M6281 Muscle weakness (generalized): Secondary | ICD-10-CM | POA: Diagnosis not present

## 2021-11-28 DIAGNOSIS — R269 Unspecified abnormalities of gait and mobility: Secondary | ICD-10-CM | POA: Insufficient documentation

## 2021-11-28 NOTE — Therapy (Signed)
?[facility] ?[place], [place], [address] ?Phone: [phone]   Fax:  [phone] ? ?Physical Therapy Treatment ? ?Patient Details  ?Name: Amber Holder ?MRN: [id number] ?Date of Birth: 07-Jul-1946 ?Referring Provider (PT): Drema Dallas PA ? ? ?Encounter Date: 11/28/2021 ? ? PT End of Session - 11/28/21 1439   ? ? Visit Number 9   ? Number of Visits 24   ? Date for PT Re-Evaluation 01/17/22   ? Authorization Type 1/10 eval 2/28   ? PT Start Time 1432   ? PT Stop Time 1515   ? PT Time Calculation (min) 43 min   ? Equipment Utilized During Treatment Gait belt   ? Activity Tolerance Patient tolerated treatment well   ? Behavior During Therapy [facility] for tasks assessed/performed   ? ?  ?  ? ?  ? ? ?Past Medical History:  ?Diagnosis Date  ? Anxiety 01/02/2012  ? Arthritis   ? Asthma   ? Blockage of coronary artery of heart (HCC)   ? Chronic kidney disease   ? Kidney stones  ? Colon polyps   ? COPD (chronic obstructive pulmonary disease) ([place])   ? Diabetes mellitus without complication ([place])   ? GERD (gastroesophageal reflux disease)   ? HOH (hard of hearing)   ? Bilateral hearing aids  ? Kidney stone   ? Osteoporosis   ? Sleep apnea   ? Does not use C-PAP on a regular basis  ? Venous stasis   ? ? ?Past Surgical History:  ?Procedure Laterality Date  ? APPENDECTOMY    ? cataract surgery Bilateral   ? CHOLECYSTECTOMY    ? COLONOSCOPY    ? COLONOSCOPY WITH PROPOFOL N/A 05/28/2015  ? Procedure: COLONOSCOPY WITH PROPOFOL;  Surgeon: Lollie Sails, MD;  Location: [facility] ENDOSCOPY;  Service: Endoscopy;  Laterality: N/A;  ? COLONOSCOPY WITH PROPOFOL N/A 10/28/2018  ? Procedure: COLONOSCOPY WITH PROPOFOL;  Surgeon: Lollie Sails, MD;  Location: [facility] ENDOSCOPY;  Service: Endoscopy;  Laterality: N/A;  ? EYE SURGERY Bilateral 2015  ? Cataract Extraction with IOL  ? LITHOTRIPSY  2015  ? has had many stones  ? Mastoidotomy  1970  ? ROTATOR CUFF REPAIR Right 2011  ?  TOTAL HIP ARTHROPLASTY Right 06/14/2016  ? Procedure: TOTAL HIP ARTHROPLASTY;  Surgeon: Dereck Leep, MD;  Location: [facility] ORS;  Service: Orthopedics;  Laterality: Right;  ? ? ?There were no vitals filed for this visit. ? ? Subjective Assessment - 11/28/21 1437   ? ? Subjective Pt reports continued BLE soreness in hips. She reports doing about the same. Reports stumbles, but denies any falls. Reports being busy this weekend; She reports being unable to do HEP this weekend which could have contributed to soreness;   ? Pertinent History Patient is a 76 year old female who presents for LE weakness and balance deficits. PMH includes anxiety, arthritis, CKD, colon polyps, COPD, DM, GERD, HOH, Kidney stones, osteoporosis, sleep apnea, venous stasis. Lives alone in [place], [place]. She was originally from [place] but more recently from [place]. 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 She has three adult children who are healthy. Patient falls in the dark; had a fall at the beach last November as well. Patient has dizziness when driving on a bumpy rode. Limited in walking distance.   ? Limitations Lifting;Standing;Walking;House hold activities   ? How long can you stand comfortably? has difficulty washing dishes   ? How long  can you walk comfortably? to mailbox   ? Patient Stated Goals to be able to walk; to improve balance   ? Currently in Pain? Yes   ? Pain Score 4    ? Pain Location Hip   ? Pain Orientation Right;Left   ? Pain Descriptors / Indicators Aching;Sore   ? Pain Type Chronic pain   ? Pain Onset 1 to 4 weeks ago   ? Pain Frequency Intermittent   ? Aggravating Factors  worse with prolonged sitting and initial standing   ? Pain Relieving Factors some movement helps   ? Effect of Pain on Daily Activities decreased activity tolerance;   ? Multiple Pain Sites No   ? ?  ?  ? ?  ? ? ? ?INTERVENTIONS:  ?  ?  ?Exercise/Activity Sets/ Reps/Time/ Resistance Assistance Charge type Comments- Unless otherwise  stated, CGA was provided and gait belt donned in order to ensure pt safety ?   ?Nustep  Level 1 x 1 min, level 2 x 2 min level 3 x 2 min  setup therex Cues for SPM > 60  ?   ?Standing hip abduction SLR 3#, x10 reps each LE BUE rail assist There Ex Minimal Difficulty, cues for erect posture   ?Standing Heel/toe raises 3# ankle weight x10 reps BUE rail assist There Ex Cues to avoid hip movement to isolate ankle ROM;   ? Standing Hip flexion march  3# ankle weight, x10 reps BUE rail assist There Ex Moderate difficulty, decreased ROM on RLE  ?LAQ  *20 w/ 3#AW   There ex   Patient reported as "Fairly easy."  ?      ?      ?  ?NMR: ?Forward/backward step airex to airex unsupported x10 reps with min A for safety ?Side step airex to airex unsupported x10 reps each direction with min A for safety ?Patient does exhibit increased instability and reports moderate difficulty;  ? ?Staggered stance one foot on each airex: ? Unsupported x10 sec with moderate instability, CGA to min A ? Progressed to lateral head turns, moderate instability requiring min A for safety and cues to turn head slowly, exhibits increased unsteadiness with RLE  behind LLE ? ?Modified tandem on airex: ?BUE ball pass side/side x5 reps each foot in front, moderate instability noted requiring min A for safety;  ? ?Dynamic gait in hallway: ?Forward/backward walking x50 feet ?Forward walking with head turns side/side x50 feet ?Backward walking with head turns side/side x50 feet ?Required CGA for safety. Patient does fatigue quickly with prolonged walking. She exhibits increased unsteadiness when walking backward especially with head turns.  ? ? ? ?Pt is HOH and utilization of general hand gestures were helpful with communication  ?  ?Pt educated throughout session about proper posture and technique with exercises. Improved exercise technique, movement at target joints, use of target muscles after min to mod verbal, visual, tactile cues. ? ?  ?  ?   ? ? ? ? ? ? ? ? ? ? ? ? ? ? ? ? ? ? ? ? ? ? ? ? ? PT Education - 11/28/21 1439   ? ? Education Details exericse technique; HEP reinforced;   ? Person(s) Educated Patient   ? Methods Explanation;Verbal cues   ? Comprehension Verbalized understanding;Returned demonstration;Verbal cues required;Need further instruction   ? ?  ?  ? ?  ? ? ? PT Short Term Goals - 10/25/21 1318   ? ?  ? PT SHORT TERM  GOAL #1  ? Title Patient will be independent in home exercise program to improve strength/mobility for better functional independence with ADLs.   ? Baseline 2/28: HEP given   ? Time 4   ? Period Weeks   ? Status New   ? Target Date 11/22/21   ?  ? PT SHORT TERM GOAL #2  ? Title Patient (> 26 years old) will complete five times sit to stand test in < 15 seconds indicating an increased LE strength and improved balance.   ? Baseline 2/28: 18.24 seconds hands on knees   ? Time 4   ? Period Weeks   ? Status New   ? Target Date 11/22/21   ? ?  ?  ? ?  ? ? ? ? PT Long Term Goals - 11/04/21 1107   ? ?  ? PT LONG TERM GOAL #1  ? Title Patient will increase FOTO score to equal to or greater than  51%   to demonstrate statistically significant improvement in mobility and quality of life   ? Baseline 2/28; 46%   ? Time 12   ? Period Weeks   ? Status New   ? Target Date 01/17/22   ?  ? PT LONG TERM GOAL #2  ? Title Patient will increase Berg Balance score by > 6 points (43/56)  to demonstrate decreased fall risk during functional activities.   ? Baseline 2/28: 37/56   ? Time 12   ? Period Weeks   ? Status New   ? Target Date 01/17/22   ?  ? PT LONG TERM GOAL #3  ? Title Patient will increase six minute walk test distance to >1300 for progression to community age norm ambulator and improve gait ability   ? Baseline 900 feet on 11/04/21   ? Time 12   ? Period Weeks   ? Status New   ? Target Date 01/17/22   ?  ? PT LONG TERM GOAL #4  ? Title Patient will increase BLE gross strength to 4+/5 as to improve functional strength for independent  gait, increased standing tolerance and increased ADL ability.   ? Baseline 2/28: see note   ? Time 12   ? Period Weeks   ? Status New   ? Target Date 01/17/22   ? ?  ?  ? ?  ? ? ? ? ? ? ? ? Plan - 11/28/21 2150   ? ? C

## 2021-11-28 NOTE — Progress Notes (Signed)
[facility] ?[address] ?[place], [place] [address] ? ?Internal MEDICINE  ?Office Visit Note ? ?Patient Name: Amber Holder ? [id number]  ?[id number] ? ?Date of Service: 11/28/2021 ? ?Chief Complaint  ?Patient presents with  ? Follow-up  ? Anxiety  ? Asthma  ? COPD  ? Gastroesophageal Reflux  ? Diabetes  ? Results  ?  echo  ? Medication Refill  ? ? ?HPI ?Pt is here with concerns of having a procedure by cardiology. Her CT CORONARY Morphology, CTA  ? ?1. Coronary calcium score of 790. This was 91st percentile for age, ?sex, and race matched control. ?  ?2. Normal coronary origin with Right dominance. ?  ?3. CAD-RADS 5 Total coronary occlusion (100%). Consider cardiac ?catheterization or viability assessment. Consider symptom-guided ?anti-ischemic pharmacotherapy as well as risk factor modification ?per guideline directed care. ?She seems a little bit confused about the next plan of action, she thinks she is supposed to have a catheterization. ?Patient also complaining of ongoing shortness of breath, she continues to be a smoker ?Her last PFTs were done in 2021 with severe reduction in DLCO ? ?Current Medication: ?Outpatient Encounter Medications as of 11/28/2021  ?Medication Sig  ? acetaminophen (TYLENOL) 500 MG chewable tablet Chew 1,000 mg by mouth every 8 (eight) hours as needed for pain.  ? albuterol (VENTOLIN HFA) 108 (90 Base) MCG/ACT inhaler Inhale 2 puffs into the lungs every 6 (six) hours as needed for wheezing or shortness of breath.  ? aspirin EC 81 MG tablet Take 1 tablet (81 mg total) by mouth daily. Swallow whole.  ? atorvastatin (LIPITOR) 40 MG tablet Take 1 tablet (40 mg total) by mouth daily.  ? cholecalciferol (VITAMIN D) 400 UNITS TABS tablet Take 2,000 Units by mouth daily.   ? CRANBERRY PO Take 25,000 mg by mouth daily.  ? ergocalciferol (DRISDOL) 1.25 MG (50000 UT) capsule Take one cap q week  ? fluticasone (FLOVENT HFA) 110 MCG/ACT inhaler Inhale 2 puffs into the lungs 2 (two) times  daily as needed.  ? furosemide (LASIX) 20 MG tablet Take 20 mg by mouth.  ? glucose blood (ONETOUCH VERIO) test strip BLOOD SUGAR TESTING ONCE DAILY . DX E11.65  ? hydrocortisone cream 0.5 % Apply topically as needed.   ? ibandronate (BONIVA) 150 MG tablet Take 1 tablet (150 mg total) by mouth every 30 (thirty) days.  ? Lancets (ONETOUCH DELICA PLUS QHUTML46T) MISC Use  as directed twice a daily DX E11.65  ? Magnesium 250 MG TABS Take by mouth. Takes 1 tablet 2-3 times per week  ? montelukast (SINGULAIR) 10 MG tablet Take 10 mg by mouth daily as needed.  ? Multiple Vitamins-Minerals (MULTIVITAMIN ADULTS PO) Take 1 tablet by mouth daily.  ? omeprazole (PRILOSEC) 40 MG capsule Take 1 capsule (40 mg total) by mouth daily.  ? potassium chloride (KLOR-CON) 8 MEQ tablet TAKE 1 TABLET (8 MEQ TOTAL) BY MOUTH AS NEEDED (ONLY TAKE WHEN TAKING LASIX).  ? Simethicone 80 MG TABS Take 1 tablet (80 mg total) by mouth as needed (burping). As directed.  ? SPIRIVA HANDIHALER 18 MCG inhalation capsule PLACE 1 CAPSULE (18 MCG TOTAL) INTO INHALER AND INHALE DAILY. FOR COPD *DO NOT SWALLOW  ? torsemide (DEMADEX) 20 MG tablet Take 0.5 tablets (10 mg total) by mouth daily.  ? ?No facility-administered encounter medications on file as of 11/28/2021.  ? ? ?Surgical History: ?Past Surgical History:  ?Procedure Laterality Date  ? APPENDECTOMY    ? cataract surgery Bilateral   ?  CHOLECYSTECTOMY    ? COLONOSCOPY    ? COLONOSCOPY WITH PROPOFOL N/A 05/28/2015  ? Procedure: COLONOSCOPY WITH PROPOFOL;  Surgeon: Lollie Sails, MD;  Location: [facility] ENDOSCOPY;  Service: Endoscopy;  Laterality: N/A;  ? COLONOSCOPY WITH PROPOFOL N/A 10/28/2018  ? Procedure: COLONOSCOPY WITH PROPOFOL;  Surgeon: Lollie Sails, MD;  Location: [facility] ENDOSCOPY;  Service: Endoscopy;  Laterality: N/A;  ? EYE SURGERY Bilateral 2015  ? Cataract Extraction with IOL  ? LITHOTRIPSY  2015  ? has had many stones  ? Mastoidotomy  1970  ? ROTATOR CUFF REPAIR Right 2011  ? TOTAL HIP  ARTHROPLASTY Right 06/14/2016  ? Procedure: TOTAL HIP ARTHROPLASTY;  Surgeon: Dereck Leep, MD;  Location: [facility] ORS;  Service: Orthopedics;  Laterality: Right;  ? ? ?Medical History: ?Past Medical History:  ?Diagnosis Date  ? Anxiety 01/02/2012  ? Arthritis   ? Asthma   ? Blockage of coronary artery of heart (HCC)   ? Chronic kidney disease   ? Kidney stones  ? Colon polyps   ? COPD (chronic obstructive pulmonary disease) ([place])   ? Diabetes mellitus without complication ([place])   ? GERD (gastroesophageal reflux disease)   ? HOH (hard of hearing)   ? Bilateral hearing aids  ? Kidney stone   ? Osteoporosis   ? Sleep apnea   ? Does not use C-PAP on a regular basis  ? Venous stasis   ? ? ?Family History: ?Family History  ?Problem Relation Age of Onset  ? Breast cancer Maternal Aunt 40  ? Bladder Cancer Neg Hx   ? Kidney cancer Neg Hx   ? ? ?Social History  ? ?Socioeconomic History  ? Marital status: Divorced  ?  Spouse name: Not on file  ? Number of children: Not on file  ? Years of education: Not on file  ? Highest education level: Not on file  ?Occupational History  ? Not on file  ?Tobacco Use  ? Smoking status: Every Day  ?  Packs/day: 1.00  ?  Types: Cigarettes  ? Smokeless tobacco: Never  ? Tobacco comments:  ?  1 pack daily  ?Vaping Use  ? Vaping Use: Never used  ?Substance and Sexual Activity  ? Alcohol use: Not Currently  ?  Comment: very rarely   ? Drug use: No  ? Sexual activity: Not Currently  ?Other Topics Concern  ? Not on file  ?Social History Narrative  ? Not on file  ? ?Social Determinants of Health  ? ?Financial Resource Strain: Not on file  ?Food Insecurity: Not on file  ?Transportation Needs: Not on file  ?Physical Activity: Not on file  ?Stress: Not on file  ?Social Connections: Not on file  ?Intimate Partner Violence: Not on file  ? ? ? ? ?Review of Systems  ?Constitutional:  Negative for chills, fatigue and unexpected weight change.  ?HENT:  Negative for congestion, postnasal drip, rhinorrhea,  sneezing and sore throat.   ?Eyes:  Negative for redness.  ?Respiratory:  Positive for shortness of breath. Negative for cough and chest tightness.   ?Cardiovascular:  Negative for chest pain and palpitations.  ?Gastrointestinal:  Negative for abdominal pain, constipation, diarrhea, nausea and vomiting.  ?Genitourinary:  Negative for dysuria and frequency.  ?Musculoskeletal:  Negative for arthralgias, back pain, joint swelling and neck pain.  ?Skin:  Negative for rash.  ?Neurological: Negative.  Negative for tremors and numbness.  ?Hematological:  Negative for adenopathy. Does not bruise/bleed easily.  ?Psychiatric/Behavioral:  Negative for behavioral  problems (Depression), sleep disturbance and suicidal ideas. The patient is not nervous/anxious.   ? ?Vital Signs: ?BP 119/73   Pulse 72   Temp 98.3 ?F (36.8 ?C)   Resp 16   Ht '5\' 6"'$  (1.676 m)   Wt 195 lb (88.5 kg)   SpO2 97%   BMI 31.47 kg/m?  ? ? ?Physical Exam ?Constitutional:   ?   Appearance: Normal appearance.  ?HENT:  ?   Head: Normocephalic and atraumatic.  ?   Nose: Nose normal.  ?   Mouth/Throat:  ?   Mouth: Mucous membranes are moist.  ?   Pharynx: No posterior oropharyngeal erythema.  ?Eyes:  ?   Extraocular Movements: Extraocular movements intact.  ?   Pupils: Pupils are equal, round, and reactive to light.  ?Cardiovascular:  ?   Pulses: Normal pulses.  ?   Heart sounds: Normal heart sounds.  ?Pulmonary:  ?   Effort: Pulmonary effort is normal.  ?   Breath sounds: Wheezing present.  ?   Comments: Decreased bs bilateral  ?Neurological:  ?   General: No focal deficit present.  ?   Mental Status: She is alert.  ?Psychiatric:     ?   Mood and Affect: Mood normal.     ?   Behavior: Behavior normal.  ? ? ? ?Assessment/Plan: ?1. Coronary atherosclerosis due to calcified coronary lesion ?I reviewed her coronary CT, spoke with her cardiologist or Dr. Kate Sable.  At this point she needs to be managed medically since her echocardiogram is normal, and  risk of perforation is high due to calcified right coronary artery lesion patient does need pulmonary work-up including a CT and PFTs ? ?2. SOB (shortness of breath) ?Shortness of breath is is caused by both her cardi

## 2021-11-29 ENCOUNTER — Telehealth: Payer: Self-pay

## 2021-11-29 NOTE — Telephone Encounter (Signed)
Spoke with pt as per dr Humphrey Rolls advised her that she don't need go back see cardiologist for now make her follow up appt for dr Humphrey Rolls next week  ?

## 2021-11-30 ENCOUNTER — Encounter: Payer: Self-pay | Admitting: Physical Therapy

## 2021-11-30 ENCOUNTER — Ambulatory Visit: Payer: Medicare Other | Admitting: Physical Therapy

## 2021-11-30 DIAGNOSIS — R269 Unspecified abnormalities of gait and mobility: Secondary | ICD-10-CM | POA: Diagnosis not present

## 2021-11-30 DIAGNOSIS — M6281 Muscle weakness (generalized): Secondary | ICD-10-CM | POA: Diagnosis not present

## 2021-11-30 DIAGNOSIS — R262 Difficulty in walking, not elsewhere classified: Secondary | ICD-10-CM

## 2021-11-30 DIAGNOSIS — R2689 Other abnormalities of gait and mobility: Secondary | ICD-10-CM | POA: Diagnosis not present

## 2021-11-30 DIAGNOSIS — R2681 Unsteadiness on feet: Secondary | ICD-10-CM | POA: Diagnosis not present

## 2021-11-30 NOTE — Therapy (Signed)
[facility] ?[place], [place], [address] ?Phone: [phone]   Fax:  [phone] ? ?Physical Therapy Treatment ?Physical Therapy Progress Note ? ? ?Dates of reporting period  10/25/21   to   11/30/21 ? ? ?Patient Details  ?Name: Amber Holder ?MRN: [id number] ?Date of Birth: [birth year] ?Referring Provider (PT): Drema Dallas PA ? ? ?Encounter Date: 11/30/2021 ? ? PT End of Session - 11/30/21 1106   ? ? Visit Number 10   ? Number of Visits 24   ? Date for PT Re-Evaluation 01/17/22   ? Authorization Type 1/10 eval 2/28   ? PT Start Time 1102   ? PT Stop Time 1145   ? PT Time Calculation (min) 43 min   ? Equipment Utilized During Treatment Gait belt   ? Activity Tolerance Patient tolerated treatment well   ? Behavior During Therapy [facility] for tasks assessed/performed   ? ?  ?  ? ?  ? ? ?Past Medical History:  ?Diagnosis Date  ? Anxiety 01/02/2012  ? Arthritis   ? Asthma   ? Blockage of coronary artery of heart (HCC)   ? Chronic kidney disease   ? Kidney stones  ? Colon polyps   ? COPD (chronic obstructive pulmonary disease) ([place])   ? Diabetes mellitus without complication ([place])   ? GERD (gastroesophageal reflux disease)   ? HOH (hard of hearing)   ? Bilateral hearing aids  ? Kidney stone   ? Osteoporosis   ? Sleep apnea   ? Does not use C-PAP on a regular basis  ? Venous stasis   ? ? ?Past Surgical History:  ?Procedure Laterality Date  ? APPENDECTOMY    ? cataract surgery Bilateral   ? CHOLECYSTECTOMY    ? COLONOSCOPY    ? COLONOSCOPY WITH PROPOFOL N/A 05/28/2015  ? Procedure: COLONOSCOPY WITH PROPOFOL;  Surgeon: Lollie Sails, MD;  Location: [facility] ENDOSCOPY;  Service: Endoscopy;  Laterality: N/A;  ? COLONOSCOPY WITH PROPOFOL N/A 10/28/2018  ? Procedure: COLONOSCOPY WITH PROPOFOL;  Surgeon: Lollie Sails, MD;  Location: [facility] ENDOSCOPY;  Service: Endoscopy;  Laterality: N/A;  ? EYE SURGERY Bilateral 2015  ? Cataract Extraction with IOL  ?  LITHOTRIPSY  2015  ? has had many stones  ? Mastoidotomy  1970  ? ROTATOR CUFF REPAIR Right 2011  ? TOTAL HIP ARTHROPLASTY Right 06/14/2016  ? Procedure: TOTAL HIP ARTHROPLASTY;  Surgeon: Dereck Leep, MD;  Location: [facility] ORS;  Service: Orthopedics;  Laterality: Right;  ? ? ?There were no vitals filed for this visit. ? ? Subjective Assessment - 11/30/21 1104   ? ? Subjective Patient reports a little soreness in right knee. She reports her pain is only when she transfers and then as she rests/walks it goes away;   ? Pertinent History Patient is a 76 year old female who presents for LE weakness and balance deficits. PMH includes anxiety, arthritis, CKD, colon polyps, COPD, DM, GERD, HOH, Kidney stones, osteoporosis, sleep apnea, venous stasis. Lives alone in [place], [place]. She was originally from [place] but more recently from [place]. 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 She has three adult children who are healthy. Patient falls in the dark; had a fall at the beach last November as well. Patient has dizziness when driving on a bumpy rode. Limited in walking distance.   ? Limitations Lifting;Standing;Walking;House hold activities   ? How long can you stand comfortably? has difficulty washing dishes   ?  How long can you walk comfortably? to mailbox   ? Patient Stated Goals to be able to walk; to improve balance   ? Currently in Pain? Yes   ? Pain Score 7    ? Pain Location Knee   ? Pain Orientation Right   ? Pain Descriptors / Indicators Aching;Sore   ? Pain Type Chronic pain   ? Pain Onset 1 to 4 weeks ago   ? Pain Frequency Intermittent   ? Aggravating Factors  worse with transfers   ? Pain Relieving Factors some movement helps   ? Effect of Pain on Daily Activities decreased activity tolerance;   ? Multiple Pain Sites No   ? ?  ?  ? ?  ? ? ? ? ? [facility] PT Assessment - 11/30/21 0001   ? ?  ? Observation/Other Assessments  ? Focus on Therapeutic Outcomes (FOTO)  49%   ?  ? ROM / Strength  ? AROM /  PROM / Strength Strength   ?  ? Strength  ? Strength Assessment Site Knee;Hip;Ankle   ? Right/Left Hip Right;Left   ? Right Hip Flexion 4-/5   ? Right Hip ABduction 4/5   ? Right Hip ADduction 4/5   ? Left Hip Flexion 4+/5   ? Left Hip ABduction 4/5   ? Left Hip ADduction 4/5   ? Right/Left Knee Left;Right   ? Right Knee Flexion 4/5   ? Right Knee Extension 4/5   ? Left Knee Flexion 5/5   ? Left Knee Extension 5/5   ? Right/Left Ankle Right;Left   ? Right Ankle Dorsiflexion 5/5   ? Left Ankle Dorsiflexion 5/5   ?  ? 6 Minute walk- Post Test  ? 6 Minute Walk Post Test yes   ? BP (mmHg) 122/60   ? HR (bpm) 77   ? 02 Sat (%RA) 93 %   ? Modified Borg Scale for Dyspnea 3- Moderate shortness of breath or breathing difficulty   ?  ? 6 minute walk test results   ? Aerobic Endurance Distance Walked 1105   ? Endurance additional comments without AD, reports increased RLE knee pain with prolonged walking   ?  ? Berg Balance Test  ? Sit to Stand Able to stand without using hands and stabilize independently   ? Standing Unsupported Able to stand safely 2 minutes   ? Sitting with Back Unsupported but Feet Supported on Floor or Stool Able to sit safely and securely 2 minutes   ? Stand to Sit Sits safely with minimal use of hands   ? Transfers Able to transfer safely, minor use of hands   ? Standing Unsupported with Eyes Closed Able to stand 10 seconds with supervision   ? Standing Unsupported with Feet Together Able to place feet together independently and stand for 1 minute with supervision   ? From Standing, Reach Forward with Outstretched Arm Can reach confidently >25 cm (10")   ? From Standing Position, Pick up Object from Floor Able to pick up shoe, needs supervision   ? From Standing Position, Turn to Look Behind Over each Shoulder Looks behind from both sides and weight shifts well   ? Turn 360 Degrees Able to turn 360 degrees safely but slowly   ? Standing Unsupported, Alternately Place Feet on Step/Stool Able to complete  4 steps without aid or supervision   ? Standing Unsupported, One Foot in Front Able to take small step independently and hold 30 seconds   ?  Standing on One Leg Tries to lift leg/unable to hold 3 seconds but remains standing independently   ? Total Score 44   ? Berg comment: improved from 37/56 on 10/25/21   ? ?  ?  ? ?  ? ? ? ?INTERVENTIONS:  ?  ?  ?Exercise/Activity Sets/ Reps/Time/ Resistance Assistance Charge type Comments- Unless otherwise stated, CGA was provided and gait belt donned in order to ensure pt safety ?   ?Nustep  Level 1 x 1 min, ?Intervals level 2-3, 30 sec each for additional 3 min;  setup therex Cues for SPM > 60  ?   ? ?Instructed patient in outcome measures, see above; ? ?Patient expressed desire to work on getting up out of the floor in case of a fall ?PT instructed patient in kneeling in floor and then standing back up: ?Stand to 1/2 knee to full knee and then 1/2 knee to stand ?Utilized mat table and red mat under patient for comfort ?Patient required min A and expressed moderate difficulty; She is limited with increased RLE knee pain  ? ? ?Pt is HOH and utilization of general hand gestures were helpful with communication  ?  ?Pt educated throughout session about proper posture and technique with exercises. Improved exercise technique, movement at target joints, use of target muscles after min to mod verbal, visual, tactile cues. ?  ? Patient's condition has the potential to improve in response to therapy. Maximum improvement is yet to be obtained. The anticipated improvement is attainable and reasonable in a generally predictable time.  Patient reports adherence with HEP. Her right knee does bother her from time to time which does affect walking and mobility;  ?  ? ? ? ? ? ? ? ? ? ? ? ? ? ? ? ? ? ? ? ? ? PT Education - 11/30/21 1105   ? ? Education Details exercise technique/HEP; progress towards goals;   ? Person(s) Educated Patient   ? Methods Explanation;Verbal cues   ? Comprehension  Verbalized understanding;Returned demonstration;Verbal cues required;Need further instruction   ? ?  ?  ? ?  ? ? ? PT Short Term Goals - 11/30/21 1110   ? ?  ? PT SHORT TERM GOAL #1  ? Title Patient will be indep

## 2021-12-05 ENCOUNTER — Ambulatory Visit: Payer: Medicare Other

## 2021-12-05 ENCOUNTER — Encounter: Payer: Self-pay | Admitting: Physical Therapy

## 2021-12-05 DIAGNOSIS — R2681 Unsteadiness on feet: Secondary | ICD-10-CM | POA: Diagnosis not present

## 2021-12-05 DIAGNOSIS — M6281 Muscle weakness (generalized): Secondary | ICD-10-CM

## 2021-12-05 DIAGNOSIS — R2689 Other abnormalities of gait and mobility: Secondary | ICD-10-CM | POA: Diagnosis not present

## 2021-12-05 DIAGNOSIS — R269 Unspecified abnormalities of gait and mobility: Secondary | ICD-10-CM

## 2021-12-05 DIAGNOSIS — R262 Difficulty in walking, not elsewhere classified: Secondary | ICD-10-CM

## 2021-12-05 NOTE — Therapy (Signed)
?[facility] ?[place], [place], [address] ?Phone: [phone]   Fax:  [phone] ? ?Physical Therapy Treatment ? ?Patient Details  ?Name: Amber Holder ?MRN: [id number] ?Date of Birth: 12/16/1945 ?Referring Provider (PT): Drema Dallas PA ? ? ?Encounter Date: 12/05/2021 ? ? PT End of Session - 12/05/21 1427   ? ? Visit Number 11   ? Number of Visits 24   ? Date for PT Re-Evaluation 01/17/22   ? Authorization Type 1/10 eval 2/28   ? PT Start Time 1429   ? PT Stop Time 3419   ? PT Time Calculation (min) 47 min   ? Equipment Utilized During Treatment Gait belt   ? Activity Tolerance Patient tolerated treatment well   ? Behavior During Therapy [facility] for tasks assessed/performed   ? ?  ?  ? ?  ? ? ?Past Medical History:  ?Diagnosis Date  ? Anxiety 01/02/2012  ? Arthritis   ? Asthma   ? Blockage of coronary artery of heart (HCC)   ? Chronic kidney disease   ? Kidney stones  ? Colon polyps   ? COPD (chronic obstructive pulmonary disease) ([place])   ? Diabetes mellitus without complication ([place])   ? GERD (gastroesophageal reflux disease)   ? HOH (hard of hearing)   ? Bilateral hearing aids  ? Kidney stone   ? Osteoporosis   ? Sleep apnea   ? Does not use C-PAP on a regular basis  ? Venous stasis   ? ? ?Past Surgical History:  ?Procedure Laterality Date  ? APPENDECTOMY    ? cataract surgery Bilateral   ? CHOLECYSTECTOMY    ? COLONOSCOPY    ? COLONOSCOPY WITH PROPOFOL N/A 05/28/2015  ? Procedure: COLONOSCOPY WITH PROPOFOL;  Surgeon: Lollie Sails, MD;  Location: [facility] ENDOSCOPY;  Service: Endoscopy;  Laterality: N/A;  ? COLONOSCOPY WITH PROPOFOL N/A 10/28/2018  ? Procedure: COLONOSCOPY WITH PROPOFOL;  Surgeon: Lollie Sails, MD;  Location: [facility] ENDOSCOPY;  Service: Endoscopy;  Laterality: N/A;  ? EYE SURGERY Bilateral 2015  ? Cataract Extraction with IOL  ? LITHOTRIPSY  2015  ? has had many stones  ? Mastoidotomy  1970  ? ROTATOR CUFF REPAIR Right 2011   ? TOTAL HIP ARTHROPLASTY Right 06/14/2016  ? Procedure: TOTAL HIP ARTHROPLASTY;  Surgeon: Dereck Leep, MD;  Location: [facility] ORS;  Service: Orthopedics;  Laterality: Right;  ? ? ?There were no vitals filed for this visit. ? ?There.ex: ?  ?Nu Step L1 for 1 minute, L2 for 2 minutes, L3 for 2 minutes for interval resistance training  ? ?Brief lumbar screen due to pt reports RLE pain in dermatomal pattern. Lumbar motion in all planes grossly WNL and painless. SLR negative on bilat LE's. Hip AROM negative for pain reproduction. Pt TTP along vastus lateralis, noted tightness with positive Ely's on RLE with hip flexion.  ? ?Prone R quad stretch with overpressure at R hip to prevent hip flexion: 4x30 sec  ? ?Seated R knee extension: 2x20, 3# AW's  ? ?Education provided on at home self stretching for vastus lateralis with use of gait belt in prone. Pt demonstrating independent performance.  ? ?Standing heel raises with BUE support: 3# AW's, 2x20  ? ?Standing alternating foot taps to 6" step for hip flexor strength and balance: 3# AW's, 2x10, CGA. Intermittent need for hand touch assist on stair rail. ? ? ?Neuro Re-Ed:  ? ?Tandem stance on airex pad: 3x30 sec holds with  each foot under BOS, CGA provided, noted sway in frontal and sagittal planes requiring ankle/hip strategy and intermittent hand touch on // bar for support. Improves as time progresses.  ? ?Ambulation over unstable mat surface:  ? Forwards with single HHA: x4 laps  ? Lateral steps with single HHA: x4 laps ? ? ?Gait post session with step through, reciprocal pattern with no antalgia noted on RLE during stance pahse which is present at beginning of session. Pt denies any R knee pain post session.  ? ? PT Education - 12/05/21 1437   ? ? Education Details form/technique with exercise.   ? Person(s) Educated Patient   ? Methods Explanation;Demonstration;Tactile cues;Verbal cues   ? Comprehension Verbalized understanding;Returned demonstration   ? ?  ?  ? ?  ? ? ?  PT Short Term Goals - 11/30/21 1110   ? ?  ? PT SHORT TERM GOAL #1  ? Title Patient will be independent in home exercise program to improve strength/mobility for better functional independence with ADLs.   ? Baseline 2/28: HEP given, 4/5: doing them 1x a day;   ? Time 4   ? Period Weeks   ? Status Achieved   ? Target Date 11/22/21   ?  ? PT SHORT TERM GOAL #2  ? Title Patient (> 72 years old) will complete five times sit to stand test in < 15 seconds indicating an increased LE strength and improved balance.   ? Baseline 2/28: 18.24 seconds hands on knees, 4/5: 18.6 sec without UE assist;   ? Time 4   ? Period Weeks   ? Status Partially Met   ? Target Date 11/22/21   ? ?  ?  ? ?  ? ? ? ? PT Long Term Goals - 11/30/21 1111   ? ?  ? PT LONG TERM GOAL #1  ? Title Patient will increase FOTO score to equal to or greater than  51%   to demonstrate statistically significant improvement in mobility and quality of life   ? Baseline 2/28; 46%, 4/5: 49%   ? Time 12   ? Period Weeks   ? Status Partially Met   ? Target Date 01/17/22   ?  ? PT LONG TERM GOAL #2  ? Title Patient will increase Berg Balance score by > 6 points (43/56)  to demonstrate decreased fall risk during functional activities.   ? Baseline 2/28: 37/56, 4/5: 44/56   ? Time 12   ? Period Weeks   ? Status Achieved   ? Target Date 01/17/22   ?  ? PT LONG TERM GOAL #3  ? Title Patient will increase six minute walk test distance to >1300 for progression to community age norm ambulator and improve gait ability   ? Baseline 900 feet on 11/04/21; 4/5: 1005 feet   ? Time 12   ? Period Weeks   ? Status Partially Met   ? Target Date 01/17/22   ?  ? PT LONG TERM GOAL #4  ? Title Patient will increase BLE gross strength to 4+/5 as to improve functional strength for independent gait, increased standing tolerance and increased ADL ability.   ? Baseline 2/28: see note, 4/5: see flowsheet   ? Time 12   ? Period Weeks   ? Status Partially Met   ? Target Date 01/17/22   ? ?  ?  ? ?   ? ? ? ? ? ? ? ? Plan - 12/05/21 1523   ? ?  Clinical Impression Statement Focus of session on PT POC with balance and LE strengthening. Due to what appeared to be RLE pain in dermatomal pattern, brief lumbar and hip screen performed. No lumbar mobility or hip mobility reproducing pain with negative SLR. Noted tautness in R quadriceps thus assessed quad length with positive Ely's test on RLE. Post PT intervention and stretching, pt denying RLE pain with reciprocal gait and no antalgic gait appreciated which was evident at beginning of sesseion. With regard to balance pt has moderate sway in frontal and sagittal planes with unstable surfaces requiring intermittent Hand touhc assist on // bar. Pt educated on ways to self manage quad stretch. Pt will continue to benefit from skilled PT services to improve pt's strength and balance to reduce risk of falls.   ? Personal Factors and Comorbidities Age;Comorbidity 3+;Fitness;Past/Current Experience;Social Background;Time since onset of injury/illness/exacerbation;Transportation   ? Comorbidities anxiety, arthritis, CKD, colon polyps, COPD, DM, GERD, HOH, Kidney stones, osteoporosis, sleep apnea, venous stasis   ? Examination-Activity Limitations Bathing;Bed Mobility;Bend;Caring for Others;Carry;Locomotion Level;Lift;Hygiene/Grooming;Dressing;Reach Overhead;Sleep;Squat;Transfers;Toileting;Stand;Stairs   ? Examination-Participation Restrictions Church;Cleaning;Community Activity;Driving;Meal Prep;Laundry;Interpersonal Relationship;Personal Finances;Shop;Volunteer;[place] [employer] Work   ? Stability/Clinical Decision Making Evolving/Moderate complexity   ? Rehab Potential Fair   ? PT Frequency 2x / week   ? PT Duration 12 weeks   ? PT Treatment/Interventions ADLs/Self Care Home Management;Cryotherapy;Electrical Stimulation;Canalith Repostioning;Moist Heat;Iontophoresis 53m/ml Dexamethasone;Traction;DME Instruction;Gait training;Stair training;Functional mobility training;Therapeutic  activities;Therapeutic exercise;Neuromuscular re-education;Balance training;Patient/family education;Manual techniques;Passive range of motion;Dry needling;Vestibular;Taping;Splinting;Energy conservation;

## 2021-12-06 ENCOUNTER — Ambulatory Visit (INDEPENDENT_AMBULATORY_CARE_PROVIDER_SITE_OTHER): Payer: Medicare Other | Admitting: Internal Medicine

## 2021-12-06 ENCOUNTER — Encounter: Payer: Self-pay | Admitting: Internal Medicine

## 2021-12-06 ENCOUNTER — Telehealth: Payer: Self-pay

## 2021-12-06 VITALS — BP 120/66 | HR 75 | Temp 98.5°F | Resp 16 | Ht 66.0 in | Wt 194.2 lb

## 2021-12-06 DIAGNOSIS — I2584 Coronary atherosclerosis due to calcified coronary lesion: Secondary | ICD-10-CM

## 2021-12-06 DIAGNOSIS — I2510 Atherosclerotic heart disease of native coronary artery without angina pectoris: Secondary | ICD-10-CM | POA: Diagnosis not present

## 2021-12-06 DIAGNOSIS — I700 Atherosclerosis of aorta: Secondary | ICD-10-CM | POA: Diagnosis not present

## 2021-12-06 DIAGNOSIS — R0602 Shortness of breath: Secondary | ICD-10-CM

## 2021-12-06 DIAGNOSIS — J449 Chronic obstructive pulmonary disease, unspecified: Secondary | ICD-10-CM | POA: Diagnosis not present

## 2021-12-06 DIAGNOSIS — F17210 Nicotine dependence, cigarettes, uncomplicated: Secondary | ICD-10-CM | POA: Diagnosis not present

## 2021-12-06 NOTE — Telephone Encounter (Signed)
Chest CT ordered. Printed. Gave to Titania-Toni ?

## 2021-12-06 NOTE — Progress Notes (Signed)
[facility] ?[address] ?[place], [place] [address] ? ?Internal MEDICINE  ?Office Visit Note ? ?Patient Name: Amber Holder ? [id number]  ?[id number] ? ?Date of Service: 01/03/2022 ? ?Chief Complaint  ?Patient presents with  ? Follow-up  ? Medication Refill  ? Diabetes  ? Gastroesophageal Reflux  ? Asthma  ? Anxiety  ? COPD  ? ? ?HPI ?Patient is here for routine follow-up.  Her son is in the room with her today. ?Patient has just seen cardiology for ongoing shortness of breath.  Patient does have coronary artery disease however no intervention needed per cardiology. ?Focus is more on pulmonary causes of shortness of breath and reduction in her weight. ?Patient is willing to work on this medical problems, she is also willing to work on her smoking. ?Patient does have intolerance to some medications we will try Daliresp and Wellbutrin to help her with her COPD and smoking cessation. ? ?Patient also has bilateral lower extremity pain and difficulty walking and there has been a question of peripheral vascular disease patient will need further evaluation ? ? ?Current Medication: ?Outpatient Encounter Medications as of 12/06/2021  ?Medication Sig  ? acetaminophen (TYLENOL) 500 MG chewable tablet Chew 1,000 mg by mouth every 8 (eight) hours as needed for pain.  ? albuterol (VENTOLIN HFA) 108 (90 Base) MCG/ACT inhaler Inhale 2 puffs into the lungs every 6 (six) hours as needed for wheezing or shortness of breath.  ? aspirin EC 81 MG tablet Take 1 tablet (81 mg total) by mouth daily. Swallow whole.  ? cholecalciferol (VITAMIN D) 400 UNITS TABS tablet Take 2,000 Units by mouth daily.   ? CRANBERRY PO Take 25,000 mg by mouth daily.  ? ergocalciferol (DRISDOL) 1.25 MG (50000 UT) capsule Take one cap q week  ? fluticasone (FLOVENT HFA) 110 MCG/ACT inhaler Inhale 2 puffs into the lungs 2 (two) times daily as needed.  ? glucose blood (ONETOUCH VERIO) test strip BLOOD SUGAR TESTING ONCE DAILY . DX E11.65  ? hydrocortisone  cream 0.5 % Apply topically as needed.   ? ibandronate (BONIVA) 150 MG tablet Take 1 tablet (150 mg total) by mouth every 30 (thirty) days.  ? Lancets (ONETOUCH DELICA PLUS GLOVFI43P) MISC Use  as directed twice a daily DX E11.65  ? Magnesium 250 MG TABS Take by mouth. Takes 1 tablet 2-3 times per week  ? montelukast (SINGULAIR) 10 MG tablet Take 10 mg by mouth daily as needed.  ? Multiple Vitamins-Minerals (MULTIVITAMIN ADULTS PO) Take 1 tablet by mouth daily.  ? omeprazole (PRILOSEC) 40 MG capsule Take 1 capsule (40 mg total) by mouth daily.  ? Simethicone 80 MG TABS Take 1 tablet (80 mg total) by mouth as needed (burping). As directed.  ? SPIRIVA HANDIHALER 18 MCG inhalation capsule PLACE 1 CAPSULE (18 MCG TOTAL) INTO INHALER AND INHALE DAILY. FOR COPD *DO NOT SWALLOW  ? [DISCONTINUED] atorvastatin (LIPITOR) 40 MG tablet Take 1 tablet (40 mg total) by mouth daily.  ? [DISCONTINUED] furosemide (LASIX) 20 MG tablet Take 20 mg by mouth.  ? [DISCONTINUED] potassium chloride (KLOR-CON) 8 MEQ tablet TAKE 1 TABLET (8 MEQ TOTAL) BY MOUTH AS NEEDED (ONLY TAKE WHEN TAKING LASIX).  ? [DISCONTINUED] torsemide (DEMADEX) 20 MG tablet Take 0.5 tablets (10 mg total) by mouth daily.  ? ?No facility-administered encounter medications on file as of 12/06/2021.  ? ? ?Surgical History: ?Past Surgical History:  ?Procedure Laterality Date  ? APPENDECTOMY    ? cataract surgery Bilateral   ? CHOLECYSTECTOMY    ?  COLONOSCOPY    ? COLONOSCOPY WITH PROPOFOL N/A 05/28/2015  ? Procedure: COLONOSCOPY WITH PROPOFOL;  Surgeon: Lollie Sails, MD;  Location: [facility] ENDOSCOPY;  Service: Endoscopy;  Laterality: N/A;  ? COLONOSCOPY WITH PROPOFOL N/A 10/28/2018  ? Procedure: COLONOSCOPY WITH PROPOFOL;  Surgeon: Lollie Sails, MD;  Location: [facility] ENDOSCOPY;  Service: Endoscopy;  Laterality: N/A;  ? EYE SURGERY Bilateral 2015  ? Cataract Extraction with IOL  ? LITHOTRIPSY  2015  ? has had many stones  ? Mastoidotomy  1970  ? ROTATOR CUFF REPAIR Right  2011  ? TOTAL HIP ARTHROPLASTY Right 06/14/2016  ? Procedure: TOTAL HIP ARTHROPLASTY;  Surgeon: Dereck Leep, MD;  Location: [facility] ORS;  Service: Orthopedics;  Laterality: Right;  ? ? ?Medical History: ?Past Medical History:  ?Diagnosis Date  ? Anxiety 01/02/2012  ? Arthritis   ? Asthma   ? Blockage of coronary artery of heart (HCC)   ? Chronic kidney disease   ? Kidney stones  ? Colon polyps   ? COPD (chronic obstructive pulmonary disease) ([place])   ? Diabetes mellitus without complication ([place])   ? GERD (gastroesophageal reflux disease)   ? HOH (hard of hearing)   ? Bilateral hearing aids  ? Kidney stone   ? Osteoporosis   ? Sleep apnea   ? Does not use C-PAP on a regular basis  ? Venous stasis   ? ? ?Family History: ?Family History  ?Problem Relation Age of Onset  ? Breast cancer Maternal Aunt 3  ? Bladder Cancer Neg Hx   ? Kidney cancer Neg Hx   ? ? ?Social History  ? ?Socioeconomic History  ? Marital status: Divorced  ?  Spouse name: Not on file  ? Number of children: Not on file  ? Years of education: Not on file  ? Highest education level: Not on file  ?Occupational History  ? Not on file  ?Tobacco Use  ? Smoking status: Every Day  ?  Packs/day: 1.00  ?  Types: Cigarettes  ? Smokeless tobacco: Never  ? Tobacco comments:  ?  1 pack daily  ?Vaping Use  ? Vaping Use: Never used  ?Substance and Sexual Activity  ? Alcohol use: Not Currently  ?  Comment: very rarely   ? Drug use: No  ? Sexual activity: Not Currently  ?Other Topics Concern  ? Not on file  ?Social History Narrative  ? Not on file  ? ?Social Determinants of Health  ? ?Financial Resource Strain: Not on file  ?Food Insecurity: Not on file  ?Transportation Needs: Not on file  ?Physical Activity: Not on file  ?Stress: Not on file  ?Social Connections: Not on file  ?Intimate Partner Violence: Not on file  ? ? ? ? ?Review of Systems  ?Constitutional:  Negative for chills, fatigue and unexpected weight change.  ?HENT:  Positive for postnasal drip. Negative  for congestion, rhinorrhea, sneezing and sore throat.   ?Eyes:  Negative for redness.  ?Respiratory:  Positive for cough and shortness of breath. Negative for chest tightness.   ?Cardiovascular:  Negative for chest pain and palpitations.  ?Gastrointestinal:  Negative for abdominal pain, constipation, diarrhea, nausea and vomiting.  ?Genitourinary:  Negative for dysuria and frequency.  ?Musculoskeletal:  Negative for arthralgias, back pain, joint swelling and neck pain.  ?Skin:  Negative for rash.  ?Neurological: Negative.  Negative for tremors and numbness.  ?Hematological:  Negative for adenopathy. Does not bruise/bleed easily.  ?Psychiatric/Behavioral:  Negative for behavioral problems (Depression), sleep  disturbance and suicidal ideas. The patient is not nervous/anxious.   ? ?Vital Signs: ?BP 120/66   Pulse 75   Temp 98.5 ?F (36.9 ?C)   Resp 16   Ht '5\' 6"'$  (1.676 m)   Wt 194 lb 3.2 oz (88.1 kg)   SpO2 96%   BMI 31.34 kg/m?  ? ? ?Physical Exam ?Constitutional:   ?   Appearance: Normal appearance.  ?HENT:  ?   Head: Normocephalic and atraumatic.  ?   Nose: Nose normal.  ?   Mouth/Throat:  ?   Mouth: Mucous membranes are moist.  ?   Pharynx: No posterior oropharyngeal erythema.  ?Eyes:  ?   Extraocular Movements: Extraocular movements intact.  ?   Pupils: Pupils are equal, round, and reactive to light.  ?Cardiovascular:  ?   Pulses: Normal pulses.  ?   Heart sounds: Normal heart sounds.  ?Pulmonary:  ?   Effort: Pulmonary effort is normal.  ?   Breath sounds: Wheezing present.  ?Neurological:  ?   General: No focal deficit present.  ?   Mental Status: She is alert.  ?Psychiatric:     ?   Mood and Affect: Mood normal.     ?   Behavior: Behavior normal.  ? ? ? ? ? ?Assessment/Plan: ?1. SOB (shortness of breath) ?Continues to have shortness of breath we will see cardiology we will order a follow-up CT she was treated for pneumonia in the hospital ?- CT Chest Wo Contrast; Future ? ?2. Coronary atherosclerosis due  to calcified coronary lesion ?Followed by cardiology ? ?3. Cigarette nicotine dependence without complication ?Patient is working on her smoking on her own however will like to have something in future ? ?4.

## 2021-12-07 ENCOUNTER — Ambulatory Visit: Payer: Medicare Other

## 2021-12-07 ENCOUNTER — Telehealth: Payer: Self-pay

## 2021-12-07 DIAGNOSIS — R262 Difficulty in walking, not elsewhere classified: Secondary | ICD-10-CM

## 2021-12-07 DIAGNOSIS — R2689 Other abnormalities of gait and mobility: Secondary | ICD-10-CM | POA: Diagnosis not present

## 2021-12-07 DIAGNOSIS — R269 Unspecified abnormalities of gait and mobility: Secondary | ICD-10-CM

## 2021-12-07 DIAGNOSIS — R2681 Unsteadiness on feet: Secondary | ICD-10-CM | POA: Diagnosis not present

## 2021-12-07 DIAGNOSIS — M6281 Muscle weakness (generalized): Secondary | ICD-10-CM | POA: Diagnosis not present

## 2021-12-07 NOTE — Therapy (Signed)
[facility] ?[place], [place], [address] ?Phone: [phone]   Fax:  [phone] ? ?Physical Therapy Treatment ? ?Patient Details  ?Name: Amber Holder ?MRN: [id number] ?Date of Birth: 1946-07-05 ?Referring Provider (PT): Drema Dallas PA ? ? ?Encounter Date: 12/07/2021 ? ? PT End of Session - 12/07/21 1014   ? ? Visit Number 12   ? Number of Visits 24   ? Date for PT Re-Evaluation 01/17/22   ? Authorization Type 2/10 eval 2/28   ? PT Start Time 0930   ? PT Stop Time 1014   ? PT Time Calculation (min) 44 min   ? Equipment Utilized During Treatment Gait belt   ? Activity Tolerance Patient tolerated treatment well   ? Behavior During Therapy [facility] for tasks assessed/performed   ? ?  ?  ? ?  ? ? ?Past Medical History:  ?Diagnosis Date  ? Anxiety 01/02/2012  ? Arthritis   ? Asthma   ? Blockage of coronary artery of heart (HCC)   ? Chronic kidney disease   ? Kidney stones  ? Colon polyps   ? COPD (chronic obstructive pulmonary disease) ([place])   ? Diabetes mellitus without complication ([place])   ? GERD (gastroesophageal reflux disease)   ? HOH (hard of hearing)   ? Bilateral hearing aids  ? Kidney stone   ? Osteoporosis   ? Sleep apnea   ? Does not use C-PAP on a regular basis  ? Venous stasis   ? ? ?Past Surgical History:  ?Procedure Laterality Date  ? APPENDECTOMY    ? cataract surgery Bilateral   ? CHOLECYSTECTOMY    ? COLONOSCOPY    ? COLONOSCOPY WITH PROPOFOL N/A 05/28/2015  ? Procedure: COLONOSCOPY WITH PROPOFOL;  Surgeon: Lollie Sails, MD;  Location: [facility] ENDOSCOPY;  Service: Endoscopy;  Laterality: N/A;  ? COLONOSCOPY WITH PROPOFOL N/A 10/28/2018  ? Procedure: COLONOSCOPY WITH PROPOFOL;  Surgeon: Lollie Sails, MD;  Location: [facility] ENDOSCOPY;  Service: Endoscopy;  Laterality: N/A;  ? EYE SURGERY Bilateral 2015  ? Cataract Extraction with IOL  ? LITHOTRIPSY  2015  ? has had many stones  ? Mastoidotomy  1970  ? ROTATOR CUFF REPAIR Right 2011   ? TOTAL HIP ARTHROPLASTY Right 06/14/2016  ? Procedure: TOTAL HIP ARTHROPLASTY;  Surgeon: Dereck Leep, MD;  Location: [facility] ORS;  Service: Orthopedics;  Laterality: Right;  ? ? ?There were no vitals filed for this visit. ? ? Subjective Assessment - 12/07/21 1014   ? ? Subjective Patient reports her stiffness continues to be an issue, discussed it with her doctor the day prior. No falls or LOB, feels her balance is improving.   ? Pertinent History Patient is a 76 year old female who presents for LE weakness and balance deficits. PMH includes anxiety, arthritis, CKD, colon polyps, COPD, DM, GERD, HOH, Kidney stones, osteoporosis, sleep apnea, venous stasis. Lives alone in [place], [place]. She was originally from [place] but more recently from [place]. 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 She has three adult children who are healthy. Patient falls in the dark; had a fall at the beach last November as well. Patient has dizziness when driving on a bumpy rode. Limited in walking distance.   ? Limitations Lifting;Standing;Walking;House hold activities   ? How long can you stand comfortably? has difficulty washing dishes   ? How long can you walk comfortably? to mailbox   ? Patient Stated Goals to  be able to walk; to improve balance   ? Currently in Pain? Yes   ? Pain Score 5    ? Pain Location Knee   ? Pain Orientation Right   ? Pain Descriptors / Indicators Aching   ? Pain Type Chronic pain   ? Pain Onset 1 to 4 weeks ago   ? ?  ?  ? ?  ? ? ? ? ? ? ? ? ? ?Neuro Re-ed: ?SLS with finger tip support 30 seconds each LE ?Airex pad: reach for letters with visual scans and horizontal  for letters with dual task of word cognitive game x4 minutes ? ?ambulate in hallway: ?-horizontal head turns with cues for reading alphabet from cards 86 ftx 2 sets ?-horizontal head turns with cues for reading number and symbols from cards 86 ft x 2 sets  ?-?red light/green light? for sudden initiation/termination of ambulation  with close CGA for carryover to natural environment 2x 86 ft  ?Patient has episode of sharp pain chest=reports its her acid reflex; given water and BP taken; resolved with water:  ?BP: 122/53 ? ?Tandem stance 30 seconds on airex pad and 6" step 30 seconds each LE placement ? ?Bosu ball round side up: lateral modified lunge 10x each LE ? ?TherEx ? ?runner stretch 30 seconds each LE ?STS 10x  ?seated: ?Prostretch df hold 5 seconds/ pf 15x  ? ? ? ?Pt educated throughout session about proper posture and technique with exercises. Improved exercise technique, movement at target joints, use of target muscles after min to mod verbal, visual, tactile cues. ? ? ?Patient does have an episode of chest pain during her session today that she reports is normal acid reflux, reduces with water. Vitals and BP monitored. Lateral modified lunge on bosu ball is challenging but reduces patient discomfort in her hips and legs indicating intervention to attempt again in the future. Pt will continue to benefit from skilled PT services to improve pt's strength and balance to reduce risk of falls. ? ? ? ? ? ? ? ? ? ? ? ? PT Education - 12/07/21 1014   ? ? Education Details exercise technique, body mechanics   ? Person(s) Educated Patient   ? Methods Explanation;Demonstration;Tactile cues;Verbal cues   ? Comprehension Verbalized understanding;Returned demonstration;Verbal cues required;Tactile cues required   ? ?  ?  ? ?  ? ? ? PT Short Term Goals - 11/30/21 1110   ? ?  ? PT SHORT TERM GOAL #1  ? Title Patient will be independent in home exercise program to improve strength/mobility for better functional independence with ADLs.   ? Baseline 2/28: HEP given, 4/5: doing them 1x a day;   ? Time 4   ? Period Weeks   ? Status Achieved   ? Target Date 11/22/21   ?  ? PT SHORT TERM GOAL #2  ? Title Patient (> 89 years old) will complete five times sit to stand test in < 15 seconds indicating an increased LE strength and improved balance.   ? Baseline  2/28: 18.24 seconds hands on knees, 4/5: 18.6 sec without UE assist;   ? Time 4   ? Period Weeks   ? Status Partially Met   ? Target Date 11/22/21   ? ?  ?  ? ?  ? ? ? ? PT Long Term Goals - 11/30/21 1111   ? ?  ? PT LONG TERM GOAL #1  ? Title Patient will increase FOTO score to equal to  or greater than  51%   to demonstrate statistically significant improvement in mobility and quality of life   ? Baseline 2/28; 46%, 4/5: 49%   ? Time 12   ? Period Weeks   ? Status Partially Met   ? Target Date 01/17/22   ?  ? PT LONG TERM GOAL #2  ? Title Patient will increase Berg Balance score by > 6 points (43/56)  to demonstrate decreased fall risk during functional activities.   ? Baseline 2/28: 37/56, 4/5: 44/56   ? Time 12   ? Period Weeks   ? Status Achieved   ? Target Date 01/17/22   ?  ? PT LONG TERM GOAL #3  ? Title Patient will increase six minute walk test distance to >1300 for progression to community age norm ambulator and improve gait ability   ? Baseline 900 feet on 11/04/21; 4/5: 1005 feet   ? Time 12   ? Period Weeks   ? Status Partially Met   ? Target Date 01/17/22   ?  ? PT LONG TERM GOAL #4  ? Title Patient will increase BLE gross strength to 4+/5 as to improve functional strength for independent gait, increased standing tolerance and increased ADL ability.   ? Baseline 2/28: see note, 4/5: see flowsheet   ? Time 12   ? Period Weeks   ? Status Partially Met   ? Target Date 01/17/22   ? ?  ?  ? ?  ? ? ? ? ? ? ? ? Plan - 12/07/21 1215   ? ? Clinical Impression Statement Patient does have an episode of chest pain during her session today that she reports is normal acid reflux, reduces with water. Vitals and BP monitored. Lateral modified lunge on bosu ball is challenging but reduces patient discomfort in her hips and legs indicating intervention to attempt again in the future. Pt will continue to benefit from skilled PT services to improve pt's strength and balance to reduce risk of falls.   ? Personal Factors and  Comorbidities Age;Comorbidity 3+;Fitness;Past/Current Experience;Social Background;Time since onset of injury/illness/exacerbation;Transportation   ? Comorbidities anxiety, arthritis, CKD, colon poly

## 2021-12-07 NOTE — Telephone Encounter (Signed)
Patient scheduled for ct chest on 12/20/21 @ 11:00 am KP/tat ?

## 2021-12-08 ENCOUNTER — Encounter: Payer: Self-pay | Admitting: Cardiology

## 2021-12-08 ENCOUNTER — Ambulatory Visit (INDEPENDENT_AMBULATORY_CARE_PROVIDER_SITE_OTHER): Payer: Medicare Other | Admitting: Cardiology

## 2021-12-08 VITALS — BP 126/64 | HR 70 | Ht 66.0 in | Wt 194.4 lb

## 2021-12-08 DIAGNOSIS — E7800 Pure hypercholesterolemia, unspecified: Secondary | ICD-10-CM

## 2021-12-08 DIAGNOSIS — F17200 Nicotine dependence, unspecified, uncomplicated: Secondary | ICD-10-CM

## 2021-12-08 DIAGNOSIS — E876 Hypokalemia: Secondary | ICD-10-CM | POA: Diagnosis not present

## 2021-12-08 DIAGNOSIS — R600 Localized edema: Secondary | ICD-10-CM | POA: Diagnosis not present

## 2021-12-08 DIAGNOSIS — I2510 Atherosclerotic heart disease of native coronary artery without angina pectoris: Secondary | ICD-10-CM | POA: Diagnosis not present

## 2021-12-08 DIAGNOSIS — R0609 Other forms of dyspnea: Secondary | ICD-10-CM | POA: Diagnosis not present

## 2021-12-08 MED ORDER — POTASSIUM CHLORIDE ER 10 MEQ PO TBCR: 10.0000 meq | EXTENDED_RELEASE_TABLET | ORAL | 1 refills | Status: DC | PRN

## 2021-12-08 MED ORDER — METOLAZONE 5 MG PO TABS: 5.0000 mg | ORAL_TABLET | Freq: Every day | ORAL | 1 refills | Status: DC

## 2021-12-08 MED ORDER — TORSEMIDE 20 MG PO TABS: 20.0000 mg | ORAL_TABLET | Freq: Every day | ORAL | 1 refills | Status: DC

## 2021-12-08 NOTE — Progress Notes (Signed)
?Cardiology Office Note:   ? ?Date:  12/08/2021  ? ?ID:  Amber Holder, DOB Oct 01, 1945, MRN [id number] ? ?PCP:  Lavera Guise, MD ?  ?[facility] Providers ?Cardiologist:  Kate Sable, MD    ? ?Referring MD: Lavera Guise, MD  ? ?No chief complaint on file. ? ? ?History of Present Illness:   ? ?Amber Holder is a 76 y.o. female with a hx of CAD (CTO RCA mild to moderate LCx and LAD dz), hyperlipidemia, diabetes, COPD, current smoker x40+ years, PAD/carotid stenosis who presents for follow-up.   ? ?Patient is being seen due to coronary artery disease and dyspnea.  Echocardiogram was obtained to evaluate any significant cardiomyopathy due to CTO of RCA.  Left heart cath not plan because RCA occlusion is chronic, patient denies chest pain, her shortness of breath was deemed likely from OSA, current smoking, COPD.  She presents for echocardiogram results.  Torsemide previously decreased to 10 mg daily due to complaints of dry mouth.  She takes 80 torsemide 20 mg daily.  She has varicose veins and edema, likely has venous insufficiency.  She has had some leg cramping of late, unsure if this is secondary to Lipitor. ? ? ?Prior notes ?Coronary CTA 10/2021 calcium score 790, CTO proximal RCA, nonobstructive LAD and left circumflex disease ?Outside echocardiogram 2021 showed normal systolic function, diastolic dysfunction EF 86% ?Chest CT 06/2016 showed coronary artery calcification ? ?Past Medical History:  ?Diagnosis Date  ? Anxiety 01/02/2012  ? Arthritis   ? Asthma   ? Blockage of coronary artery of heart (HCC)   ? Chronic kidney disease   ? Kidney stones  ? Colon polyps   ? COPD (chronic obstructive pulmonary disease) ([place])   ? Diabetes mellitus without complication ([place])   ? GERD (gastroesophageal reflux disease)   ? HOH (hard of hearing)   ? Bilateral hearing aids  ? Kidney stone   ? Osteoporosis   ? Sleep apnea   ? Does not use C-PAP on a regular basis  ? Venous stasis   ? ? ?Past Surgical History:  ?Procedure  Laterality Date  ? APPENDECTOMY    ? cataract surgery Bilateral   ? CHOLECYSTECTOMY    ? COLONOSCOPY    ? COLONOSCOPY WITH PROPOFOL N/A 05/28/2015  ? Procedure: COLONOSCOPY WITH PROPOFOL;  Surgeon: Lollie Sails, MD;  Location: [facility] ENDOSCOPY;  Service: Endoscopy;  Laterality: N/A;  ? COLONOSCOPY WITH PROPOFOL N/A 10/28/2018  ? Procedure: COLONOSCOPY WITH PROPOFOL;  Surgeon: Lollie Sails, MD;  Location: [facility] ENDOSCOPY;  Service: Endoscopy;  Laterality: N/A;  ? EYE SURGERY Bilateral 2015  ? Cataract Extraction with IOL  ? LITHOTRIPSY  2015  ? has had many stones  ? Mastoidotomy  1970  ? ROTATOR CUFF REPAIR Right 2011  ? TOTAL HIP ARTHROPLASTY Right 06/14/2016  ? Procedure: TOTAL HIP ARTHROPLASTY;  Surgeon: Dereck Leep, MD;  Location: [facility] ORS;  Service: Orthopedics;  Laterality: Right;  ? ? ?Current Medications: ?Current Meds  ?Medication Sig  ? acetaminophen (TYLENOL) 500 MG chewable tablet Chew 1,000 mg by mouth every 8 (eight) hours as needed for pain.  ? albuterol (VENTOLIN HFA) 108 (90 Base) MCG/ACT inhaler Inhale 2 puffs into the lungs every 6 (six) hours as needed for wheezing or shortness of breath.  ? aspirin EC 81 MG tablet Take 1 tablet (81 mg total) by mouth daily. Swallow whole.  ? cholecalciferol (VITAMIN D) 400 UNITS TABS tablet Take 2,000 Units by mouth daily.   ?  CRANBERRY PO Take 25,000 mg by mouth daily.  ? ergocalciferol (DRISDOL) 1.25 MG (50000 UT) capsule Take one cap q week  ? glucose blood (ONETOUCH VERIO) test strip BLOOD SUGAR TESTING ONCE DAILY . DX E11.65  ? hydrocortisone cream 0.5 % Apply topically as needed.   ? ibandronate (BONIVA) 150 MG tablet Take 1 tablet (150 mg total) by mouth every 30 (thirty) days.  ? Lancets (ONETOUCH DELICA PLUS FIEPPI95J) MISC Use  as directed twice a daily DX E11.65  ? Magnesium 250 MG TABS Take by mouth. Takes 1 tablet 2-3 times per week  ? metolazone (ZAROXOLYN) 5 MG tablet Take 1 tablet (5 mg total) by mouth daily. Take 30 mins prior to your  Torsemide.  ? montelukast (SINGULAIR) 10 MG tablet Take 10 mg by mouth daily as needed.  ? Multiple Vitamins-Minerals (MULTIVITAMIN ADULTS PO) Take 1 tablet by mouth daily.  ? omeprazole (PRILOSEC) 40 MG capsule Take 1 capsule (40 mg total) by mouth daily.  ? Simethicone 80 MG TABS Take 1 tablet (80 mg total) by mouth as needed (burping). As directed.  ? SPIRIVA HANDIHALER 18 MCG inhalation capsule PLACE 1 CAPSULE (18 MCG TOTAL) INTO INHALER AND INHALE DAILY. FOR COPD *DO NOT SWALLOW  ? [DISCONTINUED] atorvastatin (LIPITOR) 40 MG tablet Take 1 tablet (40 mg total) by mouth daily.  ? [DISCONTINUED] potassium chloride (KLOR-CON) 8 MEQ tablet TAKE 1 TABLET (8 MEQ TOTAL) BY MOUTH AS NEEDED (ONLY TAKE WHEN TAKING LASIX).  ? [DISCONTINUED] torsemide (DEMADEX) 20 MG tablet Take 0.5 tablets (10 mg total) by mouth daily.  ?  ? ?Allergies:   Iodinated contrast media, Shellfish allergy, Sulfa antibiotics, Sulfasalazine, Percocet [oxycodone-acetaminophen], and Iodine  ? ?Social History  ? ?Socioeconomic History  ? Marital status: Divorced  ?  Spouse name: Not on file  ? Number of children: Not on file  ? Years of education: Not on file  ? Highest education level: Not on file  ?Occupational History  ? Not on file  ?Tobacco Use  ? Smoking status: Every Day  ?  Packs/day: 1.00  ?  Types: Cigarettes  ? Smokeless tobacco: Never  ? Tobacco comments:  ?  1 pack daily  ?Vaping Use  ? Vaping Use: Never used  ?Substance and Sexual Activity  ? Alcohol use: Not Currently  ?  Comment: very rarely   ? Drug use: No  ? Sexual activity: Not Currently  ?Other Topics Concern  ? Not on file  ?Social History Narrative  ? Not on file  ? ?Social Determinants of Health  ? ?Financial Resource Strain: Not on file  ?Food Insecurity: Not on file  ?Transportation Needs: Not on file  ?Physical Activity: Not on file  ?Stress: Not on file  ?Social Connections: Not on file  ?  ? ?Family History: ?The patient's family history includes Breast cancer (age of  onset: 65) in her maternal aunt. There is no history of Bladder Cancer or Kidney cancer. ? ?ROS:   ?Please see the history of present illness.    ? All other systems reviewed and are negative. ? ?EKGs/Labs/Other Studies Reviewed:   ? ?The following studies were reviewed today: ? ? ?EKG:  EKG not  ordered today.   ? ?Recent Labs: ?12/24/2020: Hemoglobin 16.1; Platelets 238; TSH 1.770 ?06/13/2021: ALT 16 ?11/09/2021: BUN 19; Creatinine, Ser 0.83; Potassium 4.0; Sodium 141  ?Recent Lipid Panel ?   ?Component Value Date/Time  ? CHOL 239 (H) 12/24/2020 1154  ? TRIG 92 12/24/2020 1154  ?  HDL 55 12/24/2020 1154  ? [place] 168 (H) 12/24/2020 1154  ? ? ? ?Risk Assessment/Calculations:   ? ? ?    ? ?Physical Exam:   ? ?VS:  BP 126/64   Pulse 70   Ht '5\' 6"'$  (1.676 m)   Wt 194 lb 6.4 oz (88.2 kg)   SpO2 94%   BMI 31.38 kg/m?    ? ?Wt Readings from Last 3 Encounters:  ?12/08/21 194 lb 6.4 oz (88.2 kg)  ?12/06/21 194 lb 3.2 oz (88.1 kg)  ?11/28/21 195 lb (88.5 kg)  ?  ? ?GEN:  Well nourished, well developed in no acute distress ?HEENT: Normal ?NECK: No JVD; No carotid bruits ?CARDIAC: RRR, no murmurs, rubs, gallops ?RESPIRATORY: Decreased breath sounds, no wheezing. ?ABDOMEN: Soft, non-tender, non-distended ?MUSCULOSKELETAL:  1+ edema; No deformity  ?SKIN: Warm and dry ?NEUROLOGIC:  Alert and oriented x 3 ?PSYCHIATRIC:  Normal affect  ? ?ASSESSMENT:   ? ?1. Coronary artery disease involving native coronary artery of native heart, unspecified whether angina present   ?2. Dyspnea on exertion   ?3. Pure hypercholesterolemia   ?4. Smoking   ?5. Leg edema   ?6. Hypokalemia   ? ? ? ?PLAN:   ? ?In order of problems listed above: ? ?CAD, CTO RCA.  Calcium score 790.  Denies chest pain.  Continue aspirin 81 mg daily, stop Lipitor due to leg cramps.  echo EF 60 to 65%, impaired relaxation.  No plans for left heart cath, as patient has CTO, patient also has iodine allergies. ?Dyspnea on exertion, history of COPD, OSA, current smoking  which are likely contributing to shortness of breath.   ?Hyperlipidemia, stop Lipitor, monitor for improvements of leg cramps.  If symptoms improve, consider starting Zetia-bempedoic acid at follow-up visit ?[place]

## 2021-12-08 NOTE — Patient Instructions (Addendum)
Medication Instructions:  ? ?Your physician has recommended you make the following change in your medication:  ? ? START taking Metolazone 5 MG 30 mins prior to your Torsemide once a day. ? ?2.    INCREASE your Potassium Chloride to 10 MEQ once a day. ? ?3.    STOP taking your Lipitor. ? ?*If you need a refill on your cardiac medications before your next appointment, please call your pharmacy* ? ? ?Lab Work: ? ?Your physician recommends that you return for lab work (BMP) in: On Tues 12/13/21 ? ? ? ?Please return to our office on___Tuesday 4/18___at___________am/pm ? ? ? ?Testing/Procedures: ? ?None ordered ? ? ?Follow-Up: ?At [facility], you and your health needs are our priority.  As part of our continuing mission to provide you with exceptional heart care, we have created designated Provider Care Teams.  These Care Teams include your primary Cardiologist (physician) and Advanced Practice Providers (APPs -  Physician Assistants and Nurse Practitioners) who all work together to provide you with the care you need, when you need it. ? ?We recommend signing up for the patient portal called "MyChart".  Sign up information is provided on this After Visit Summary.  MyChart is used to connect with patients for Virtual Visits (Telemedicine).  Patients are able to view lab/test results, encounter notes, upcoming appointments, etc.  Non-urgent messages can be sent to your provider as well.   ?To learn more about what you can do with MyChart, go to [email or web address].   ? ?Your next appointment:   ? ?Thursday 12/22/21  at 10:40  ? ?The format for your next appointment:   ?In Person ? ?Provider:   ?Kate Sable, MD  ? ? ? ?Important Information About Sugar ? ? ? ? ? ? ?

## 2021-12-12 ENCOUNTER — Ambulatory Visit: Payer: Medicare Other

## 2021-12-13 ENCOUNTER — Other Ambulatory Visit (INDEPENDENT_AMBULATORY_CARE_PROVIDER_SITE_OTHER): Payer: Medicare Other

## 2021-12-13 DIAGNOSIS — E876 Hypokalemia: Secondary | ICD-10-CM

## 2021-12-13 DIAGNOSIS — I2510 Atherosclerotic heart disease of native coronary artery without angina pectoris: Secondary | ICD-10-CM

## 2021-12-14 ENCOUNTER — Ambulatory Visit (INDEPENDENT_AMBULATORY_CARE_PROVIDER_SITE_OTHER): Payer: Medicare Other | Admitting: Nurse Practitioner

## 2021-12-14 ENCOUNTER — Encounter: Payer: Self-pay | Admitting: Physical Therapy

## 2021-12-14 ENCOUNTER — Encounter (INDEPENDENT_AMBULATORY_CARE_PROVIDER_SITE_OTHER): Payer: Medicare Other

## 2021-12-14 ENCOUNTER — Ambulatory Visit: Payer: Medicare Other | Admitting: Physical Therapy

## 2021-12-14 VITALS — BP 107/73 | HR 80

## 2021-12-14 DIAGNOSIS — R262 Difficulty in walking, not elsewhere classified: Secondary | ICD-10-CM

## 2021-12-14 DIAGNOSIS — M6281 Muscle weakness (generalized): Secondary | ICD-10-CM | POA: Diagnosis not present

## 2021-12-14 DIAGNOSIS — R269 Unspecified abnormalities of gait and mobility: Secondary | ICD-10-CM | POA: Diagnosis not present

## 2021-12-14 DIAGNOSIS — R2689 Other abnormalities of gait and mobility: Secondary | ICD-10-CM | POA: Diagnosis not present

## 2021-12-14 DIAGNOSIS — R2681 Unsteadiness on feet: Secondary | ICD-10-CM | POA: Diagnosis not present

## 2021-12-14 NOTE — Therapy (Signed)
?[facility] ?[place], [place], [address] ?Phone: [phone]   Fax:  [phone] ? ?Physical Therapy Treatment ? ?Patient Details  ?Name: Amber Holder ?MRN: [id number] ?Date of Birth: 02/14/1946 ?Referring Provider (PT): Drema Dallas PA ? ? ?Encounter Date: 12/14/2021 ? ? PT End of Session - 12/14/21 1303   ? ? Visit Number 13   ? Number of Visits 24   ? Date for PT Re-Evaluation 01/17/22   ? Authorization Type 2/10 eval 2/28   ? PT Start Time 1302   ? PT Stop Time 4825   ? PT Time Calculation (min) 43 min   ? Equipment Utilized During Treatment Gait belt   ? Activity Tolerance Patient tolerated treatment well   ? Behavior During Therapy [facility] for tasks assessed/performed   ? ?  ?  ? ?  ? ? ?Past Medical History:  ?Diagnosis Date  ? Anxiety 01/02/2012  ? Arthritis   ? Asthma   ? Blockage of coronary artery of heart (HCC)   ? Chronic kidney disease   ? Kidney stones  ? Colon polyps   ? COPD (chronic obstructive pulmonary disease) ([place])   ? Diabetes mellitus without complication ([place])   ? GERD (gastroesophageal reflux disease)   ? HOH (hard of hearing)   ? Bilateral hearing aids  ? Kidney stone   ? Osteoporosis   ? Sleep apnea   ? Does not use C-PAP on a regular basis  ? Venous stasis   ? ? ?Past Surgical History:  ?Procedure Laterality Date  ? APPENDECTOMY    ? cataract surgery Bilateral   ? CHOLECYSTECTOMY    ? COLONOSCOPY    ? COLONOSCOPY WITH PROPOFOL N/A 05/28/2015  ? Procedure: COLONOSCOPY WITH PROPOFOL;  Surgeon: Lollie Sails, MD;  Location[facility] END[facility]COPY;  Service: Endoscopy;  Laterality: N/A;  ? COLONOSCOPY WITH PROPOFOL N/A 10/28/2018  ? Procedure: COLONOSCOPY WITH PROPOFOL;  Surgeon: Lollie Sails, MD;  Location: [facility] ENDOSCOPY;  Service: Endoscopy;  Laterality: N/A;  ? EYE SURGERY Bilateral 2015  ? Cataract Extraction with IOL  ? LITHOTRIPSY  2015  ? has had many stones  ? Mastoidotomy  1970  ? ROTATOR CUFF REPAIR Right 2011   ? TOTAL HIP ARTHROPLASTY Right 06/14/2016  ? Procedure: TOTAL HIP ARTHROPLASTY;  Surgeon: Dereck Leep, MD;  Location: [facility] ORS;  Service: Orthopedics;  Laterality: Right;  ? ? ?Vitals:  ? 12/14/21 1317  ?BP: 107/73  ?Pulse: 80  ? ? ? Subjective Assessment - 12/14/21 1306   ? ? Subjective Patient reports the doctors changed her medication changing iron/potassium medication; She reports increased cramps last night; She took pickle juice and that helped her get some sleep; She also reports having some trouble swallowing last night so she took some benadryl. She reports pharmacist told her that her new medication ZAROXOLYN can cause allergic reaction. She reports feeling awful last night but was pleased she was able to keep her balance. reports also feeling light headed and nauseated;   ? Pertinent History Patient is a 76 year old female who presents for LE weakness and balance deficits. PMH includes anxiety, arthritis, CKD, colon polyps, COPD, DM, GERD, HOH, Kidney stones, osteoporosis, sleep apnea, venous stasis. Lives alone in [place], [place]. She was originally from [place] but more recently from [place]. 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 She has three adult children who are healthy. Patient falls in the dark; [place] as well. Patient has dizziness when driving on a bumpy rode. Limited in walking distance.   ? Limitations Lifting;Standing;Walking;House hold activities   ? How long can you stand comfortably? has difficulty washing dishes   ? How long can you walk comfortably? to mailbox   ? Patient Stated Goals to be able to walk; to improve balance   ? Currently in Pain? No/denies   ? Pain Onset 1 to 4 weeks ago   ? ?  ?  ? ?  ? ? ?  ?  ?Neuro Re-ed: ?Standing on airex pad: ?-feet together eyes open 30 sec hold, eyes closed 10 sec, with increased unsteadiness with eyes closed ?-alternate toe taps to 6 inch step unsupported x10 reps ?Progressed to one foot on airex,  one foot on 6 inch step: ? Unsupported standing x15 sec hold ? Progressed to passing cone side/side x10 reps each foot on step; ?Standing with feet apart, squatting and tapping ball to bottom step and standing back up x10 reps; Required cues to bend knees and avoid stooping for better LE challenge; ?BP re-assessed 119/69, HR 81 ?  ?ambulate in hallway: ?-forward/backward walking with horizontal head turns with cues for reading alphabet from cards 86 ftx 1 sets ?-Side stepping x10 feet tapping wall side/side x5 reps each with min VCs for proper sequencing/positioning; ?-forward walk with toss/catch ball x60 feet ?-forward walking with side toss to challenge dynamic stability with reaching outside base of support x60 feet; ? ?  ?Standing on 1/2 bolster ? -Feet apart with toe/heel hanging off to challenge ankle strategies x15 sec hold with CGA to min; ?Tandem stance 10 sec hold with 1-0 rail assist x1 rep each foot in front; ?Patient required min A for safety reporting increased difficulty especially with firm 1/2 bolster with increased unsteadiness;  ? ? ?Standing on 1/2 bolster (round side up): ?Heel raises x10 reps with BUE rail assist ?Toe raises x10 reps with BUE rail assist ?Patient reports increased fatigue and soreness in BLE calf; ?  ?Pt educated throughout session about proper posture and technique with exercises. Improved exercise technique, movement at target joints, use of target muscles after min to mod verbal, visual, tactile cues. ?  ? ?Tolerated session fair. Patient does fatigue requiring short seated rest break. She denies any increase in symptoms;  ?  ? ? ? ? ? ? ? ? ? ? ? ? ? ? ? ? ? ? ? ? ? ? ? ? PT Education - 12/14/21 1303   ? ? Education Details exercise technique/positioning;   ? Person(s) Educated Patient   ? Methods Explanation;Verbal cues   ? Comprehension Verbalized understanding;Returned demonstration;Verbal cues required;Need further instruction   ? ?  ?  ? ?  ? ? ? PT Short Term Goals -  11/30/21 1110   ? ?  ? PT SHORT TERM GOAL #1  ? Title Patient will be independent in home exercise program to improve strength/mobility for better functional independence with ADLs.   ? Baseline 2/28: HEP given, 4/5: doing them 1x a day;   ? Time 4   ? Period Weeks   ? Status Achieved   ? Target Date 11/22/21   ?  ? PT SHORT TERM GOAL #2  ? Title Patient (> 30 years old) will complete five times sit to stand test in < 15 seconds indicating an increased LE strength and improved balance.   ? Baseline 2/28: 18.24 seconds hands on knees, 4/5: 18.6  sec without UE assist;   ? Time 4   ? Period Weeks   ? Status Partially Met   ? Target Date 11/22/21   ? ?  ?  ? ?  ? ? ? ? PT Long Term Goals - 11/30/21 1111   ? ?  ? PT LONG TERM GOAL #1  ? Title Patient will increase FOTO score to equal to or greater than  51%   to demonstrate statistically significant improvement in mobility and quality of life   ? Baseline 2/28; 46%, 4/5: 49%   ? Time 12   ? Period Weeks   ? Status Partially Met   ? Target Date 01/17/22   ?  ? PT LONG TERM GOAL #2  ? Title Patient will increase Berg Balance score by > 6 points (43/56)  to demonstrate decreased fall risk during functional activities.   ? Baseline 2/28: 37/56, 4/5: 44/56   ? Time 12   ? Period Weeks   ? Status Achieved   ? Target Date 01/17/22   ?  ? PT LONG TERM GOAL #3  ? Title Patient will increase six minute walk test distance to >1300 for progression to community age norm ambulator and improve gait ability   ? Baseline 900 feet on 11/04/21; 4/5: 1005 feet   ? Time 12   ? Period Weeks   ? Status Partially Met   ? Target Date 01/17/22   ?  ? PT LONG TERM GOAL #4  ? Title Patient will increase BLE gross strength to 4+/5 as to improve functional strength for independent gait, increased standing tolerance and increased ADL ability.   ? Baseline 2/28: see note, 4/5: see flowsheet   ? Time 12   ? Period Weeks   ? Status Partially Met   ? Target Date 01/17/22   ? ?  ?  ? ?  ? ? ? ? ? ? ? ? Plan  - 12/14/21 1418   ? ? Clinical Impression Statement Patient motivated and participated fair within session; She does report having some issues over last few days with medication change. Recommend p

## 2021-12-15 LAB — BASIC METABOLIC PANEL
BUN/Creatinine Ratio: 27 (ref 12–28)
BUN: 28 mg/dL — ABNORMAL HIGH (ref 8–27)
CO2: 24 mmol/L (ref 20–29)
Calcium: 10.4 mg/dL — ABNORMAL HIGH (ref 8.7–10.3)
Chloride: 94 mmol/L — ABNORMAL LOW (ref 96–106)
Creatinine, Ser: 1.04 mg/dL — ABNORMAL HIGH (ref 0.57–1.00)
Glucose: 126 mg/dL — ABNORMAL HIGH (ref 70–99)
Potassium: 4.4 mmol/L (ref 3.5–5.2)
Sodium: 140 mmol/L (ref 134–144)
eGFR: 56 mL/min/{1.73_m2} — ABNORMAL LOW (ref >59)

## 2021-12-16 ENCOUNTER — Telehealth: Payer: Self-pay | Admitting: Cardiology

## 2021-12-16 DIAGNOSIS — I872 Venous insufficiency (chronic) (peripheral): Secondary | ICD-10-CM

## 2021-12-16 MED ORDER — METOLAZONE 5 MG PO TABS: 2.5000 mg | ORAL_TABLET | Freq: Every day | ORAL | 1 refills | Status: DC

## 2021-12-16 NOTE — Telephone Encounter (Signed)
Reported sx aren't very specific. She can try taking metolazone with food to see if it helps. ?

## 2021-12-16 NOTE — Telephone Encounter (Signed)
Called patient and gave her the pharmacists recommendation. She also stated that 2 nights ago she had some cramping in her chest , legs, sides of ribs cages and took benadryl  50 mg and it went away. Patient reports that her swelling has improved a lot. ? ?I also gave her the BMP result note received today as follows: ? ?Creatinine slightly elevated, metolazone to 2.5 mg daily.  Check BMP in 2 weeks. ? ?Patient [facility]  ? ?Patient was grateful for the follow up. ? ? ?

## 2021-12-16 NOTE — Telephone Encounter (Signed)
Pt c/o medication issue: ? ?1. Name of Medication:  ?metolazone (ZAROXOLYN) 5 MG tablet ? ?2. How are you currently taking this medication (dosage and times per day)?  ?As prescribed, 1 tablet by mouth 30 minutes prior to Torsemide and Potassium Chloride ? ?3. Are you having a reaction (difficulty breathing--STAT)?  ? ?4. What is your medication issue?  ? ?Patient states about 5 minutes after taking medication she gets a "funny feeling"  in her stomach. ? ?

## 2021-12-19 ENCOUNTER — Ambulatory Visit: Payer: Medicare Other

## 2021-12-19 DIAGNOSIS — R2681 Unsteadiness on feet: Secondary | ICD-10-CM

## 2021-12-19 DIAGNOSIS — R262 Difficulty in walking, not elsewhere classified: Secondary | ICD-10-CM | POA: Diagnosis not present

## 2021-12-19 DIAGNOSIS — R2689 Other abnormalities of gait and mobility: Secondary | ICD-10-CM | POA: Diagnosis not present

## 2021-12-19 DIAGNOSIS — R269 Unspecified abnormalities of gait and mobility: Secondary | ICD-10-CM | POA: Diagnosis not present

## 2021-12-19 DIAGNOSIS — M6281 Muscle weakness (generalized): Secondary | ICD-10-CM

## 2021-12-19 NOTE — Therapy (Signed)
[facility] ?[place], [place], [address] ?Phone: [phone]   Fax:  [phone] ? ?Physical Therapy Treatment ? ?Patient Details  ?Name: Amber Holder ?MRN: [id number] ?Date of Birth: 16-Mar-1946 ?Referring Provider (PT): Drema Dallas PA ? ? ?Encounter Date: 12/19/2021 ? ? PT End of Session - 12/19/21 5329   ? ? Visit Number 14   ? Number of Visits 24   ? Date for PT Re-Evaluation 01/17/22   ? Authorization Type 4/10 eval 2/28   ? PT Start Time [phone]   ? PT Stop Time 1014   ? PT Time Calculation (min) 38 min   ? Equipment Utilized During Treatment Gait belt   ? Activity Tolerance Patient tolerated treatment well   ? Behavior During Therapy [facility] for tasks assessed/performed   ? ?  ?  ? ?  ? ? ?Past Medical History:  ?Diagnosis Date  ? Anxiety 01/02/2012  ? Arthritis   ? Asthma   ? Blockage of coronary artery of heart (HCC)   ? Chronic kidney disease   ? Kidney stones  ? Colon polyps   ? COPD (chronic obstructive pulmonary disease) ([place])   ? Diabetes mellitus without complication ([place])   ? GERD (gastroesophageal reflux disease)   ? HOH (hard of hearing)   ? Bilateral hearing aids  ? Kidney stone   ? Osteoporosis   ? Sleep apnea   ? Does not use C-PAP on a regular basis  ? Venous stasis   ? ? ?Past Surgical History:  ?Procedure Laterality Date  ? APPENDECTOMY    ? cataract surgery Bilateral   ? CHOLECYSTECTOMY    ? COLONOSCOPY    ? COLONOSCOPY WITH PROPOFOL N/A 05/28/2015  ? Procedure: COLONOSCOPY WITH PROPOFOL;  Surgeon: Lollie Sails, MD;  Location: [facility] ENDOSCOPY;  Service: Endoscopy;  Laterality: N/A;  ? COLONOSCOPY WITH PROPOFOL N/A 10/28/2018  ? Procedure: COLONOSCOPY WITH PROPOFOL;  Surgeon: Lollie Sails, MD;  Location: [facility] ENDOSCOPY;  Service: Endoscopy;  Laterality: N/A;  ? EYE SURGERY Bilateral 2015  ? Cataract Extraction with IOL  ? LITHOTRIPSY  2015  ? has had many stones  ? Mastoidotomy  1970  ? ROTATOR CUFF REPAIR Right 2011   ? TOTAL HIP ARTHROPLASTY Right 06/14/2016  ? Procedure: TOTAL HIP ARTHROPLASTY;  Surgeon: Dereck Leep, MD;  Location: [facility] ORS;  Service: Orthopedics;  Laterality: Right;  ? ? ?There were no vitals filed for this visit. ? ? Subjective Assessment - 12/19/21 0938   ? ? Subjective Patient reports increased indigestion and dizziness, reports she has talked with doctor at this time. Is getting a scan tomorrow.   ? Pertinent History Patient is a 76 year old female who presents for LE weakness and balance deficits. PMH includes anxiety, arthritis, CKD, colon polyps, COPD, DM, GERD, HOH, Kidney stones, osteoporosis, sleep apnea, venous stasis. Lives alone in [place], [place]. She was originally from [place] but more recently from [place]. 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 She has three adult children who are healthy. Patient falls in the dark; had a fall at the beach last November as well. Patient has dizziness when driving on a bumpy rode. Limited in walking distance.   ? Limitations Lifting;Standing;Walking;House hold activities   ? How long can you stand comfortably? has difficulty washing dishes   ? How long can you walk comfortably? to mailbox   ? Patient Stated Goals to be able to walk; to improve  balance   ? Currently in Pain? No/denies   ? ?  ?  ? ?  ? ? ? ? ?BP 108/69 standing; 109/62 standing  ? ?Neuro Re-ed: ?Standing with CGA next to support surface:  ?Airex pad: static stand 30 seconds x 2 trials, noticeable trembling of ankles/LE's with fatigue and challenge to maintain stability ?Airex pad: horizontal head turns 30 seconds scanning room 10x ; cueing for arc of motion  ?Airex pad: vertical head turns 30 seconds, cueing for arc of motion, noticeable sway with upward gaze increasing demand on ankle righting reaction musculature ?Airex pad: one foot on 6" step one foot on airex pad, hold position for 30 seconds, switch legs, 2x each LE; ? ?Between six cones: ?-Weave between 6 cones: close CGA  x2 trials  ?-anterior/posterior weave/chevron pattern x2 trials ?-lateral step with toe taps to each cone x2 trials; very challenging for patient.  ?  ?ambulate in hallway: ?-throw ball up and down multiple near LOB requiring assistance to maintain position x160 ft ? ?Sit to stand with overhead ball press. 10x  ? ?Seated adduction with ball between feet with LAQ 10x ?  ? ?Pt educated throughout session about proper posture and technique with exercises. Improved exercise technique, movement at target joints, use of target muscles after min to mod verbal, visual, tactile cues. ? ? ?Patient presents with excellent motivation throughout physical therapy. She does have some challenge with single limb stance toe taps on cones indicating area of continued focus. Head movements/dual task with ambulation is very challenging with multiple near LOB. Patient would benefit from additional skilled PT intervention to improve strength, balance and mobility ? ? ? ? ? ? ? ? ? ? ? ? ? ? ? ? ? ? ? ? PT Education - 12/19/21 0938   ? ? Education Details exercise technique, body mechanics   ? Person(s) Educated Patient   ? Methods Explanation;Demonstration;Tactile cues;Verbal cues   ? Comprehension Verbalized understanding;Returned demonstration;Verbal cues required;Tactile cues required   ? ?  ?  ? ?  ? ? ? PT Short Term Goals - 11/30/21 1110   ? ?  ? PT SHORT TERM GOAL #1  ? Title Patient will be independent in home exercise program to improve strength/mobility for better functional independence with ADLs.   ? Baseline 2/28: HEP given, 4/5: doing them 1x a day;   ? Time 4   ? Period Weeks   ? Status Achieved   ? Target Date 11/22/21   ?  ? PT SHORT TERM GOAL #2  ? Title Patient (> 31 years old) will complete five times sit to stand test in < 15 seconds indicating an increased LE strength and improved balance.   ? Baseline 2/28: 18.24 seconds hands on knees, 4/5: 18.6 sec without UE assist;   ? Time 4   ? Period Weeks   ? Status  Partially Met   ? Target Date 11/22/21   ? ?  ?  ? ?  ? ? ? ? PT Long Term Goals - 11/30/21 1111   ? ?  ? PT LONG TERM GOAL #1  ? Title Patient will increase FOTO score to equal to or greater than  51%   to demonstrate statistically significant improvement in mobility and quality of life   ? Baseline 2/28; 46%, 4/5: 49%   ? Time 12   ? Period Weeks   ? Status Partially Met   ? Target Date 01/17/22   ?  ?  PT LONG TERM GOAL #2  ? Title Patient will increase Berg Balance score by > 6 points (43/56)  to demonstrate decreased fall risk during functional activities.   ? Baseline 2/28: 37/56, 4/5: 44/56   ? Time 12   ? Period Weeks   ? Status Achieved   ? Target Date 01/17/22   ?  ? PT LONG TERM GOAL #3  ? Title Patient will increase six minute walk test distance to >1300 for progression to community age norm ambulator and improve gait ability   ? Baseline 900 feet on 11/04/21; 4/5: 1005 feet   ? Time 12   ? Period Weeks   ? Status Partially Met   ? Target Date 01/17/22   ?  ? PT LONG TERM GOAL #4  ? Title Patient will increase BLE gross strength to 4+/5 as to improve functional strength for independent gait, increased standing tolerance and increased ADL ability.   ? Baseline 2/28: see note, 4/5: see flowsheet   ? Time 12   ? Period Weeks   ? Status Partially Met   ? Target Date 01/17/22   ? ?  ?  ? ?  ? ? ? ? ? ? ? ? Plan - 12/19/21 1012   ? ? Clinical Impression Statement Patient presents with excellent motivation throughout physical therapy. She does have some challenge with single limb stance toe taps on cones indicating area of continued focus. Head movements/dual task with ambulation is very challenging with multiple near LOB. Patient would benefit from additional skilled PT intervention to improve strength, balance and mobility   ? Personal Factors and Comorbidities Age;Comorbidity 3+;Fitness;Past/Current Experience;Social Background;Time since onset of injury/illness/exacerbation;Transportation   ? Comorbidities  anxiety, arthritis, CKD, colon polyps, COPD, DM, GERD, HOH, Kidney stones, osteoporosis, sleep apnea, venous stasis   ? Examination-Activity Limitations Bathing;Bed Mobility;Bend;Caring for Others;Carry;Locomo

## 2021-12-20 ENCOUNTER — Encounter: Payer: Self-pay | Admitting: Nurse Practitioner

## 2021-12-20 ENCOUNTER — Ambulatory Visit (INDEPENDENT_AMBULATORY_CARE_PROVIDER_SITE_OTHER): Payer: Medicare Other | Admitting: Nurse Practitioner

## 2021-12-20 ENCOUNTER — Ambulatory Visit
Admission: RE | Admit: 2021-12-20 | Discharge: 2021-12-20 | Disposition: A | Discharge status: Home / Self Care | Payer: Medicare Other | Source: Ambulatory Visit | Attending: Internal Medicine | Admitting: Internal Medicine

## 2021-12-20 VITALS — BP 109/50 | HR 80 | Temp 98.4°F | Resp 16 | Ht 66.0 in | Wt 187.2 lb

## 2021-12-20 DIAGNOSIS — R0602 Shortness of breath: Secondary | ICD-10-CM | POA: Insufficient documentation

## 2021-12-20 DIAGNOSIS — E119 Type 2 diabetes mellitus without complications: Secondary | ICD-10-CM | POA: Diagnosis not present

## 2021-12-20 DIAGNOSIS — J479 Bronchiectasis, uncomplicated: Secondary | ICD-10-CM | POA: Diagnosis not present

## 2021-12-20 DIAGNOSIS — R609 Edema, unspecified: Secondary | ICD-10-CM

## 2021-12-20 DIAGNOSIS — J432 Centrilobular emphysema: Secondary | ICD-10-CM | POA: Diagnosis not present

## 2021-12-20 MED ORDER — METFORMIN HCL 500 MG PO TABS: 500.0000 mg | ORAL_TABLET | Freq: Two times a day (BID) | ORAL | 1 refills | Status: DC

## 2021-12-20 NOTE — Progress Notes (Signed)
Amber Holder ?[address] ?[place], [place] [address] ? ?Internal MEDICINE  ?Office Visit Note ? ?Patient Name: Amber Holder ? [id number]  ?[id number] ? ?Date of Service: 12/20/2021 ? ?Chief Complaint  ?Patient presents with  ? Acute Visit  ?  Elevated blood sugars, it falls very quickly as well  ? ? ? ?HPI ?[name] presents for an acute sick visit for elevated blood glucose levels. She reports that her blood glucose levels fasting have been higher than 140 more often. She also reports that her glucose level will drop fast down to 80 which is low for her. Patient used to take metformin 250 mg once or twice a day as needed for when she is experiencing higher than normal glucose levels. Her last A1C was 5.7. She would like to be restarted on metformin which was discontinued about 2 years ago.  ?She also stopped using breztri. She states that her tongue feels slimy. She reports that she does rinse her mouth out after each use of the inhaler. She wants to talk to Dr. Clayborn Holder at her follow up visit on 12/27/21 about the inhaler. She reports that she still feels short of breath and states that she is not sure if breztri is the right inhaler for her.  ?She also reports that her cardiologist increased her torsemide dose, stopped her atorvastatin and started her on metolazone and that she knows that Dr. Stephanie Holder will be upset about this. I assured her that if her cardiologist made these changes and is doing what he believes is best for the patient then she will review the notes from cardiology and focus on interventions that are best for the patient's health.  ? ? ? ?Current Medication: ? ?Outpatient Encounter Medications as of 12/20/2021  ?Medication Sig  ? acetaminophen (TYLENOL) 500 MG chewable tablet Chew 1,000 mg by mouth every 8 (eight) hours as needed for pain.  ? albuterol (VENTOLIN HFA) 108 (90 Base) MCG/ACT inhaler Inhale 2 puffs into the lungs every 6 (six) hours as needed for wheezing or shortness of  breath.  ? aspirin EC 81 MG tablet Take 1 tablet (81 mg total) by mouth daily. Swallow whole.  ? cholecalciferol (VITAMIN D) 400 UNITS TABS tablet Take 2,000 Units by mouth daily.   ? CRANBERRY PO Take 25,000 mg by mouth daily.  ? ergocalciferol (DRISDOL) 1.25 MG (50000 UT) capsule Take one cap q week  ? fluticasone (FLOVENT HFA) 110 MCG/ACT inhaler Inhale 2 puffs into the lungs 2 (two) times daily as needed.  ? glucose blood (ONETOUCH VERIO) test strip BLOOD SUGAR TESTING ONCE DAILY . DX E11.65  ? hydrocortisone cream 0.5 % Apply topically as needed.   ? ibandronate (BONIVA) 150 MG tablet Take 1 tablet (150 mg total) by mouth every 30 (thirty) days.  ? Lancets (ONETOUCH DELICA PLUS MBTDHR41U) MISC Use  as directed twice a daily DX E11.65  ? Magnesium 250 MG TABS Take by mouth. Takes 1 tablet 2-3 times per week  ? metFORMIN (GLUCOPHAGE) 500 MG tablet Take 1 tablet (500 mg total) by mouth 2 (two) times daily with a meal. May take a half tablet.  ? metolazone (ZAROXOLYN) 5 MG tablet Take 0.5 tablets (2.5 mg total) by mouth daily. Take 30 mins prior to your Torsemide.  ? montelukast (SINGULAIR) 10 MG tablet Take 10 mg by mouth daily as needed.  ? Multiple Vitamins-Minerals (MULTIVITAMIN ADULTS PO) Take 1 tablet by mouth daily.  ? omeprazole (PRILOSEC) 40 MG capsule Take 1  capsule (40 mg total) by mouth daily.  ? potassium chloride (KLOR-CON) 10 MEQ tablet Take 1 tablet (10 mEq total) by mouth as needed (only take when taking Lasix).  ? Simethicone 80 MG TABS Take 1 tablet (80 mg total) by mouth as needed (burping). As directed.  ? SPIRIVA HANDIHALER 18 MCG inhalation capsule PLACE 1 CAPSULE (18 MCG TOTAL) INTO INHALER AND INHALE DAILY. FOR COPD *DO NOT SWALLOW  ? torsemide (DEMADEX) 20 MG tablet Take 1 tablet (20 mg total) by mouth daily.  ? ?No facility-administered encounter medications on file as of 12/20/2021.  ? ? ? ? ?Medical History: ?Past Medical History:  ?Diagnosis Date  ? Anxiety 01/02/2012  ? Arthritis   ?  Asthma   ? Blockage of coronary artery of heart (HCC)   ? Chronic kidney disease   ? Kidney stones  ? Colon polyps   ? COPD (chronic obstructive pulmonary disease) ([place])   ? Diabetes mellitus without complication ([place])   ? GERD (gastroesophageal reflux disease)   ? HOH (hard of hearing)   ? Bilateral hearing aids  ? Kidney stone   ? Osteoporosis   ? Sleep apnea   ? Does not use C-PAP on a regular basis  ? Venous stasis   ? ? ? ?Vital Signs: ?BP (!) 109/50   Pulse 80   Temp 98.4 ?F (36.9 ?C)   Resp 16   Ht '5\' 6"'$  (1.676 m)   Wt 187 lb 3.2 oz (84.9 kg)   SpO2 95%   BMI 30.21 kg/m?  ? ? ?Review of Systems  ?Constitutional:  Negative for chills, fatigue and unexpected weight change.  ?HENT:  Negative for congestion, rhinorrhea, sneezing and sore throat.   ?Eyes:  Negative for redness.  ?Respiratory:  Negative for cough, chest tightness and shortness of breath.   ?Cardiovascular:  Negative for chest pain and palpitations.  ?Gastrointestinal:  Negative for abdominal pain, constipation, diarrhea, nausea and vomiting.  ?Genitourinary:  Negative for dysuria and frequency.  ?Musculoskeletal:  Negative for arthralgias, back pain, joint swelling and neck pain.  ?Skin:  Negative for rash.  ?Neurological: Negative.  Negative for tremors and numbness.  ?Hematological:  Negative for adenopathy. Does not bruise/bleed easily.  ?Psychiatric/Behavioral:  Negative for behavioral problems (Depression), sleep disturbance and suicidal ideas. The patient is not nervous/anxious.   ? ?Physical Exam ?Vitals reviewed.  ?Constitutional:   ?   General: She is not in acute distress. ?   Appearance: Normal appearance. She is obese. She is not ill-appearing.  ?HENT:  ?   Head: Normocephalic and atraumatic.  ?Eyes:  ?   Pupils: Pupils are equal, round, and reactive to light.  ?Cardiovascular:  ?   Rate and Rhythm: Normal rate and regular rhythm.  ?Pulmonary:  ?   Effort: Pulmonary effort is normal. No respiratory distress.  ?Musculoskeletal:  ?    Right lower leg: Edema present.  ?   Left lower leg: Edema present.  ?Neurological:  ?   Mental Status: She is alert and oriented to person, place, and time.  ?Psychiatric:     ?   Mood and Affect: Mood normal.     ?   Behavior: Behavior normal.  ? ? ? ? ?Assessment/Plan: ?1. Type 2 diabetes mellitus without complication, without long-term current use of insulin ([place]) ?Metformin added twice daily. She may take a 1/2 tablet instead of a whole tablet which is what she did in the past.  ?- metFORMIN (GLUCOPHAGE) 500 MG tablet; Take 1  tablet (500 mg total) by mouth 2 (two) times daily with a meal. May take a half tablet.  Dispense: 180 tablet; Refill: 1 ? ?2. Shortness of breath ?Still short of breath, patient does not think breztri is the right inhaler for her. She plans to discuss this with Dr. Clayborn Holder next week at her follow up appt.  ? ?3. Peripheral edema ?Torsemide dose was increased, she was started on metolazone by her cardiologist and she is wearing compression stockings that are just below the knee.  ? ? ?General Counseling: Krystall verbalizes understanding of the findings of todays visit and agrees with plan of treatment. I have discussed any further diagnostic evaluation that may be needed or ordered today. We also reviewed her medications today. she has been encouraged to call the office with any questions or concerns that should arise related to todays visit. ? ? ? ?Counseling: ? ? ? ?No orders of the defined types were placed in this encounter. ? ? ?Meds ordered this encounter  ?Medications  ? metFORMIN (GLUCOPHAGE) 500 MG tablet  ?  Sig: Take 1 tablet (500 mg total) by mouth 2 (two) times daily with a meal. May take a half tablet.  ?  Dispense:  180 tablet  ?  Refill:  1  ? ? ?Return for previously scheduled, F/U with [facility]. ? ?[place] Controlled Substance Database was reviewed by me for overdose risk score (ORS) ? ?Time spent:30 Minutes ?Time spent with patient included reviewing progress notes, labs,  imaging studies, and discussing plan for follow up.  ? ?This patient was seen by Jonetta Osgood, FNP-C in collaboration with Dr. Clayborn Holder as a part of collaborative care agreement. ? ?Jayma Volpi R. Valetta Fuller, MSN, FNP

## 2021-12-21 ENCOUNTER — Ambulatory Visit: Payer: Medicare Other

## 2021-12-21 DIAGNOSIS — R262 Difficulty in walking, not elsewhere classified: Secondary | ICD-10-CM

## 2021-12-21 DIAGNOSIS — M6281 Muscle weakness (generalized): Secondary | ICD-10-CM | POA: Diagnosis not present

## 2021-12-21 DIAGNOSIS — R269 Unspecified abnormalities of gait and mobility: Secondary | ICD-10-CM | POA: Diagnosis not present

## 2021-12-21 DIAGNOSIS — R2681 Unsteadiness on feet: Secondary | ICD-10-CM | POA: Diagnosis not present

## 2021-12-21 DIAGNOSIS — R2689 Other abnormalities of gait and mobility: Secondary | ICD-10-CM | POA: Diagnosis not present

## 2021-12-21 NOTE — Therapy (Signed)
[facility] ?[place], [place], [address] ?Phone: [phone]   Fax:  [phone] ? ?Physical Therapy Treatment ? ?Patient Details  ?Name: Amber Holder ?MRN: [id number] ?Date of Birth: [birth year] ?Referring Provider (PT): Drema Dallas PA ? ? ?Encounter Date: 12/21/2021 ? ? PT End of Session - 12/21/21 5697   ? ? Visit Number 15   ? Number of Visits 24   ? Date for PT Re-Evaluation 01/17/22   ? Authorization Type 5/10 eval 2/28   ? PT Start Time [phone]   ? PT Stop Time 1015   ? PT Time Calculation (min) 39 min   ? Equipment Utilized During Treatment Gait belt   ? Activity Tolerance Patient tolerated treatment well   ? Behavior During Therapy [facility] for tasks assessed/performed   ? ?  ?  ? ?  ? ? ?Past Medical History:  ?Diagnosis Date  ? Anxiety 01/02/2012  ? Arthritis   ? Asthma   ? Blockage of coronary artery of heart (HCC)   ? Chronic kidney disease   ? Kidney stones  ? Colon polyps   ? COPD (chronic obstructive pulmonary disease) ([place])   ? Diabetes mellitus without complication ([place])   ? GERD (gastroesophageal reflux disease)   ? HOH (hard of hearing)   ? Bilateral hearing aids  ? Kidney stone   ? Osteoporosis   ? Sleep apnea   ? Does not use C-PAP on a regular basis  ? Venous stasis   ? ? ?Past Surgical History:  ?Procedure Laterality Date  ? APPENDECTOMY    ? cataract surgery Bilateral   ? CHOLECYSTECTOMY    ? COLONOSCOPY    ? COLONOSCOPY WITH PROPOFOL N/A 05/28/2015  ? Procedure: COLONOSCOPY WITH PROPOFOL;  Surgeon: Lollie Sails, MD;  Location: [facility] ENDOSCOPY;  Service: Endoscopy;  Laterality: N/A;  ? COLONOSCOPY WITH PROPOFOL N/A 10/28/2018  ? Procedure: COLONOSCOPY WITH PROPOFOL;  Surgeon: Lollie Sails, MD;  Location: [facility] ENDOSCOPY;  Service: Endoscopy;  Laterality: N/A;  ? EYE SURGERY Bilateral 2015  ? Cataract Extraction with IOL  ? LITHOTRIPSY  2015  ? has had many stones  ? Mastoidotomy  1970  ? ROTATOR CUFF REPAIR Right 2011   ? TOTAL HIP ARTHROPLASTY Right 06/14/2016  ? Procedure: TOTAL HIP ARTHROPLASTY;  Surgeon: Dereck Leep, MD;  Location: [facility] ORS;  Service: Orthopedics;  Laterality: Right;  ? ? ?There were no vitals filed for this visit. ? ? Subjective Assessment - 12/21/21 0937   ? ? Subjective Patient reports she went to the doctor yesterday, waiting on results. Has been sleeping with RLE over LLE.   ? Pertinent History Patient is a 76 year old female who presents for LE weakness and balance deficits. PMH includes anxiety, arthritis, CKD, colon polyps, COPD, DM, GERD, HOH, Kidney stones, osteoporosis, sleep apnea, venous stasis. Lives alone in [place], [place]. She was originally from [place] but more recently from [place]. 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 She has three adult children who are healthy. Patient falls in the dark; had a fall at the beach last November as well. Patient has dizziness when driving on a bumpy rode. Limited in walking distance.   ? Limitations Lifting;Standing;Walking;House hold activities   ? How long can you stand comfortably? has difficulty washing dishes   ? How long can you walk comfortably? to mailbox   ? Patient Stated Goals to be able to walk; to improve balance   ?  Currently in Pain? No/denies   ? ?  ?  ? ?  ? ? ? ? ? ? ? ? ?Treatment:  ? ?6" step  ?-step up alternating LE's 10x each LE no UE support ?-lateral step up 10x each LE;  ?-alternating toe taps 15x each LE  ? -airex pad 6" step modified tandem stance hold 30 seconds x 2 sets each LE ? ?Speed ladder:  ?-one foot each square for correction of ambulation technique x 6 trials  ?-lateral stepping 4x length ?-diagonal stepping in in out out 4x length   ?ambulate in hallway: ?-throw ball up and down multiple near LOB requiring assistance to maintain position x160 ft ?  ?Sit to stand 10x  ? ?Seated: ?RTB around bilateral feet: march with df and feet apart 12x each LE  ?RTB hamstring curl 10x  ?  ?Pt educated throughout  session about proper posture and technique with exercises. Improved exercise technique, movement at target joints, use of target muscles after min to mod verbal, visual, tactile cues. ? ? ?Patient presents slightly late to PT session. Has been eager to progress her mobility and stability. Patient is challenged with single limb stability, does require oxygen monitoring this session due to lowered Sp02 with interventions. Patient is challenged with speeding up coordination challenge. Patient would benefit from additional skilled PT intervention to improve strength, balance and mobility ? ? ? ? ? ? ? ? ? ? ? ? ? ? ? ? ? PT Education - 12/21/21 0942   ? ? Education Details exercise technique, body mechanics   ? Person(s) Educated Patient   ? Methods Explanation;Demonstration;Verbal cues;Tactile cues   ? Comprehension Verbalized understanding;Returned demonstration;Verbal cues required;Tactile cues required   ? ?  ?  ? ?  ? ? ? PT Short Term Goals - 11/30/21 1110   ? ?  ? PT SHORT TERM GOAL #1  ? Title Patient will be independent in home exercise program to improve strength/mobility for better functional independence with ADLs.   ? Baseline 2/28: HEP given, 4/5: doing them 1x a day;   ? Time 4   ? Period Weeks   ? Status Achieved   ? Target Date 11/22/21   ?  ? PT SHORT TERM GOAL #2  ? Title Patient (76 years old) will complete five times sit to stand test in < 15 seconds indicating an increased LE strength and improved balance.   ? Baseline 2/28: 18.24 seconds hands on knees, 4/5: 18.6 sec without UE assist;   ? Time 4   ? Period Weeks   ? Status Partially Met   ? Target Date 11/22/21   ? ?  ?  ? ?  ? ? ? ? PT Long Term Goals - 11/30/21 1111   ? ?  ? PT LONG TERM GOAL #1  ? Title Patient will increase FOTO score to equal to or greater than  51%   to demonstrate statistically significant improvement in mobility and quality of life   ? Baseline 2/28; 46%, 4/5: 49%   ? Time 12   ? Period Weeks   ? Status Partially Met    ? Target Date 01/17/22   ?  ? PT LONG TERM GOAL #2  ? Title Patient will increase Berg Balance score by > 6 points (43/56)  to demonstrate decreased fall risk during functional activities.   ? Baseline 2/28: 37/56, 4/5: 44/56   ? Time 12   ? Period Weeks   ?  Status Achieved   ? Target Date 01/17/22   ?  ? PT LONG TERM GOAL #3  ? Title Patient will increase six minute walk test distance to >1300 for progression to community age norm ambulator and improve gait ability   ? Baseline 900 feet on 11/04/21; 4/5: 1005 feet   ? Time 12   ? Period Weeks   ? Status Partially Met   ? Target Date 01/17/22   ?  ? PT LONG TERM GOAL #4  ? Title Patient will increase BLE gross strength to 4+/5 as to improve functional strength for independent gait, increased standing tolerance and increased ADL ability.   ? Baseline 2/28: see note, 4/5: see flowsheet   ? Time 12   ? Period Weeks   ? Status Partially Met   ? Target Date 01/17/22   ? ?  ?  ? ?  ? ? ? ? ? ? ? ? Plan - 12/21/21 1003   ? ? Clinical Impression Statement Patient presents slightly late to PT session. Has been eager to progress her mobility and stability. Patient is challenged with single limb stability, does require oxygen monitoring this session due to lowered Sp02 with interventions. Patient is challenged with speeding up coordination challenge. Patient would benefit from additional skilled PT intervention to improve strength, balance and mobility   ? Personal Factors and Comorbidities Age;Comorbidity 3+;Fitness;Past/Current Experience;Social Background;Time since onset of injury/illness/exacerbation;Transportation   ? Comorbidities anxiety, arthritis, CKD, colon polyps, COPD, DM, GERD, HOH, Kidney stones, osteoporosis, sleep apnea, venous stasis   ? Examination-Activity Limitations Bathing;Bed Mobility;Bend;Caring for Others;Carry;Locomotion Level;Lift;Hygiene/Grooming;Dressing;Reach Overhead;Sleep;Squat;Transfers;Toileting;Stand;Stairs   ? Examination-Participation  Restrictions Church;Cleaning;Community Activity;Driving;Meal Prep;Laundry;Interpersonal Relationship;Personal Finances;Shop;Volunteer;[place] [employer] Work   ? Stability/Clinical Decision Making Evolving/Moderate complexi

## 2021-12-22 ENCOUNTER — Other Ambulatory Visit
Admission: RE | Admit: 2021-12-22 | Discharge: 2021-12-22 | Disposition: A | Discharge status: Home / Self Care | Payer: Medicare Other | Attending: Cardiology | Admitting: Cardiology

## 2021-12-22 ENCOUNTER — Encounter: Payer: Self-pay | Admitting: Cardiology

## 2021-12-22 ENCOUNTER — Telehealth: Payer: Self-pay

## 2021-12-22 ENCOUNTER — Ambulatory Visit (INDEPENDENT_AMBULATORY_CARE_PROVIDER_SITE_OTHER): Payer: Medicare Other | Admitting: Cardiology

## 2021-12-22 VITALS — BP 108/60 | HR 68 | Ht 66.0 in | Wt 187.0 lb

## 2021-12-22 DIAGNOSIS — I2510 Atherosclerotic heart disease of native coronary artery without angina pectoris: Secondary | ICD-10-CM

## 2021-12-22 DIAGNOSIS — R0609 Other forms of dyspnea: Secondary | ICD-10-CM | POA: Diagnosis not present

## 2021-12-22 DIAGNOSIS — E7800 Pure hypercholesterolemia, unspecified: Secondary | ICD-10-CM | POA: Diagnosis not present

## 2021-12-22 DIAGNOSIS — R600 Localized edema: Secondary | ICD-10-CM

## 2021-12-22 DIAGNOSIS — F17200 Nicotine dependence, unspecified, uncomplicated: Secondary | ICD-10-CM | POA: Diagnosis not present

## 2021-12-22 LAB — BASIC METABOLIC PANEL
Anion gap: 10 (ref 5–15)
BUN: 27 mg/dL — ABNORMAL HIGH (ref 8–23)
CO2: 36 mmol/L — ABNORMAL HIGH (ref 22–32)
Calcium: 9.9 mg/dL (ref 8.9–10.3)
Chloride: 92 mmol/L — ABNORMAL LOW (ref 98–111)
Creatinine, Ser: 0.77 mg/dL (ref 0.44–1.00)
GFR, Estimated: >60 mL/min (ref >60)
Glucose, Bld: 136 mg/dL — ABNORMAL HIGH (ref 70–99)
Potassium: 2.6 mmol/L — CL (ref 3.5–5.1)
Sodium: 138 mmol/L (ref 135–145)

## 2021-12-22 MED ORDER — EZETIMIBE 10 MG PO TABS: 10.0000 mg | ORAL_TABLET | Freq: Every day | ORAL | 3 refills | Status: DC

## 2021-12-22 MED ORDER — POTASSIUM CHLORIDE CRYS ER 20 MEQ PO TBCR: 20.0000 meq | EXTENDED_RELEASE_TABLET | Freq: Every day | ORAL | 1 refills | Status: DC

## 2021-12-22 NOTE — Telephone Encounter (Signed)
error 

## 2021-12-22 NOTE — Patient Instructions (Signed)
Medication Instructions:  ? ?Your physician has recommended you make the following change in your medication:  ? ? START taking Zetia 10 MG once a day. ? ?*If you need a refill on your cardiac medications before your next appointment, please call your pharmacy* ? ? ?Lab Work: ? ?Please go to the medical mall after your appointment today for a BMP lab draw. ? ? ?Testing/Procedures: ? ? ? ?Follow-Up: ?At [facility], you and your health needs are our priority.  As part of our continuing mission to provide you with exceptional heart care, we have created designated Provider Care Teams.  These Care Teams include your primary Cardiologist (physician) and Advanced Practice Providers (APPs -  Physician Assistants and Nurse Practitioners) who all work together to provide you with the care you need, when you need it. ? ?We recommend signing up for the patient portal called "MyChart".  Sign up information is provided on this After Visit Summary.  MyChart is used to connect with patients for Virtual Visits (Telemedicine).  Patients are able to view lab/test results, encounter notes, upcoming appointments, etc.  Non-urgent messages can be sent to your provider as well.   ?To learn more about what you can do with MyChart, go to [email or web address].   ? ?Your next appointment:   ?2 month(s) ? ?The format for your next appointment:   ?In Person ? ?Provider:   ?You may see Kate Sable, MD or one of the following Advanced Practice Providers on your designated Care Team:   ?Murray Hodgkins, NP ?Christell Faith, PA-C ?Cadence Kathlen Mody, PA-C  ? ? ?Other Instructions ? ? ?Important Information About Sugar ? ? ? ? ?  ?

## 2021-12-22 NOTE — Telephone Encounter (Signed)
Received a call from the lab with a critical K+ of 2.6.  ?Spoke with Dr. Garen Lah and he recommended that the patient take Potassium (KCL) 40 MEQ daily, for 5 days and then get a repeat BMP draw.  ?Spoke with patient and informed her of this. She verbalized understanding and agreed with plan. She will get the lab drawn at the medical mall on 12/26/21. ?

## 2021-12-22 NOTE — Progress Notes (Signed)
?Cardiology Office Note:   ? ?Date:  12/22/2021  ? ?ID:  Amber Holder, DOB [birth year], MRN [id number] ? ?PCP:  Lavera Guise, MD ?  ?[facility] Providers ?Cardiologist:  Kate Sable, MD    ? ?Referring MD: Lavera Guise, MD  ? ?Chief Complaint  ?Patient presents with  ? Other  ?  2 week follow -- Patient c/o SOB and swelling. Meds reviewed verbally with patient.   ? ? ?History of Present Illness:   ? ?Amber Holder is a 76 y.o. female with a hx of CAD (CTO RCA mild to moderate LCx and LAD dz), hyperlipidemia, diabetes, COPD, current smoker x40+ years, PAD/carotid stenosis who presents for follow-up.   ? ?Previously seen for leg edema, metolazone 5 mg daily started in addition to torsemide.  BMP showed abnormal creatinine, metolazone dose reduced to 2.5 mg daily.  Endorsed having cramping with Lipitor, Lipitor was stopped after last visit, cramping seems to have improved.  She states drinking pickle juice once which might have helped. ? ?She has varicose veins and edema, likely has venous insufficiency.   ? ? ?Prior notes ?Coronary CTA 10/2021 calcium score 790, CTO proximal RCA, nonobstructive LAD and left circumflex disease ?Outside echocardiogram 2021 showed normal systolic function, diastolic dysfunction EF 35% ?Chest CT 06/2016 showed coronary artery calcification ? ?Past Medical History:  ?Diagnosis Date  ? Anxiety 01/02/2012  ? Arthritis   ? Asthma   ? Blockage of coronary artery of heart (HCC)   ? Chronic kidney disease   ? Kidney stones  ? Colon polyps   ? COPD (chronic obstructive pulmonary disease) ([place])   ? Diabetes mellitus without complication ([place])   ? GERD (gastroesophageal reflux disease)   ? HOH (hard of hearing)   ? Bilateral hearing aids  ? Kidney stone   ? Osteoporosis   ? Sleep apnea   ? Does not use C-PAP on a regular basis  ? Venous stasis   ? ? ?Past Surgical History:  ?Procedure Laterality Date  ? APPENDECTOMY    ? cataract surgery Bilateral   ? CHOLECYSTECTOMY    ? COLONOSCOPY    ?  COLONOSCOPY WITH PROPOFOL N/A 05/28/2015  ? Procedure: COLONOSCOPY WITH PROPOFOL;  Surgeon: Lollie Sails, MD;  Location: [facility] ENDOSCOPY;  Service: Endoscopy;  Laterality: N/A;  ? COLONOSCOPY WITH PROPOFOL N/A 10/28/2018  ? Procedure: COLONOSCOPY WITH PROPOFOL;  Surgeon: Lollie Sails, MD;  Location: [facility] ENDOSCOPY;  Service: Endoscopy;  Laterality: N/A;  ? EYE SURGERY Bilateral 2015  ? Cataract Extraction with IOL  ? LITHOTRIPSY  2015  ? has had many stones  ? Mastoidotomy  1970  ? ROTATOR CUFF REPAIR Right 2011  ? TOTAL HIP ARTHROPLASTY Right 06/14/2016  ? Procedure: TOTAL HIP ARTHROPLASTY;  Surgeon: Dereck Leep, MD;  Location: [facility] ORS;  Service: Orthopedics;  Laterality: Right;  ? ? ?Current Medications: ?Current Meds  ?Medication Sig  ? acetaminophen (TYLENOL) 500 MG chewable tablet Chew 1,000 mg by mouth every 8 (eight) hours as needed for pain.  ? albuterol (VENTOLIN HFA) 108 (90 Base) MCG/ACT inhaler Inhale 2 puffs into the lungs every 6 (six) hours as needed for wheezing or shortness of breath.  ? aspirin EC 81 MG tablet Take 1 tablet (81 mg total) by mouth daily. Swallow whole.  ? cholecalciferol (VITAMIN D) 400 UNITS TABS tablet Take 2,000 Units by mouth daily.   ? CRANBERRY PO Take 25,000 mg by mouth daily.  ? ergocalciferol (DRISDOL) 1.25 MG (50000 UT)  capsule Take one cap q week  ? ezetimibe (ZETIA) 10 MG tablet Take 1 tablet (10 mg total) by mouth daily.  ? fluticasone (FLOVENT HFA) 110 MCG/ACT inhaler Inhale 2 puffs into the lungs 2 (two) times daily as needed.  ? glucose blood (ONETOUCH VERIO) test strip BLOOD SUGAR TESTING ONCE DAILY . DX E11.65  ? hydrocortisone cream 0.5 % Apply topically as needed.   ? ibandronate (BONIVA) 150 MG tablet Take 1 tablet (150 mg total) by mouth every 30 (thirty) days.  ? Lancets (ONETOUCH DELICA PLUS QASTMH96Q) MISC Use  as directed twice a daily DX E11.65  ? Magnesium 250 MG TABS Take by mouth. Takes 1 tablet 2-3 times per week  ? metFORMIN (GLUCOPHAGE)  500 MG tablet Take 1 tablet (500 mg total) by mouth 2 (two) times daily with a meal. May take a half tablet.  ? metolazone (ZAROXOLYN) 5 MG tablet Take 0.5 tablets (2.5 mg total) by mouth daily. Take 30 mins prior to your Torsemide.  ? montelukast (SINGULAIR) 10 MG tablet Take 10 mg by mouth daily as needed.  ? Multiple Vitamins-Minerals (MULTIVITAMIN ADULTS PO) Take 1 tablet by mouth daily.  ? omeprazole (PRILOSEC) 40 MG capsule Take 1 capsule (40 mg total) by mouth daily.  ? potassium chloride (KLOR-CON) 10 MEQ tablet Take 1 tablet (10 mEq total) by mouth as needed (only take when taking Lasix).  ? Simethicone 80 MG TABS Take 1 tablet (80 mg total) by mouth as needed (burping). As directed.  ? SPIRIVA HANDIHALER 18 MCG inhalation capsule PLACE 1 CAPSULE (18 MCG TOTAL) INTO INHALER AND INHALE DAILY. FOR COPD *DO NOT SWALLOW  ? torsemide (DEMADEX) 20 MG tablet Take 1 tablet (20 mg total) by mouth daily.  ?  ? ?Allergies:   Iodinated contrast media, Shellfish allergy, Sulfa antibiotics, Sulfasalazine, Percocet [oxycodone-acetaminophen], and Iodine  ? ?Social History  ? ?Socioeconomic History  ? Marital status: Divorced  ?  Spouse name: Not on file  ? Number of children: Not on file  ? Years of education: Not on file  ? Highest education level: Not on file  ?Occupational History  ? Not on file  ?Tobacco Use  ? Smoking status: Every Day  ?  Packs/day: 1.00  ?  Types: Cigarettes  ? Smokeless tobacco: Never  ? Tobacco comments:  ?  1 pack daily  ?Vaping Use  ? Vaping Use: Never used  ?Substance and Sexual Activity  ? Alcohol use: Not Currently  ?  Comment: very rarely   ? Drug use: No  ? Sexual activity: Not Currently  ?Other Topics Concern  ? Not on file  ?Social History Narrative  ? Not on file  ? ?Social Determinants of Health  ? ?Financial Resource Strain: Not on file  ?Food Insecurity: Not on file  ?Transportation Needs: Not on file  ?Physical Activity: Not on file  ?Stress: Not on file  ?Social Connections: Not on  file  ?  ? ?Family History: ?The patient's family history includes Breast cancer (age of onset: 43) in her maternal aunt. There is no history of Bladder Cancer or Kidney cancer. ? ?ROS:   ?Please see the history of present illness.    ? All other systems reviewed and are negative. ? ?EKGs/Labs/Other Studies Reviewed:   ? ?The following studies were reviewed today: ? ? ?EKG:  EKG not  ordered today.   ? ?Recent Labs: ?12/24/2020: Hemoglobin 16.1; Platelets 238; TSH 1.770 ?06/13/2021: ALT 16 ?12/13/2021: BUN 28; Creatinine, Ser 1.04;  Potassium 4.4; Sodium 140  ?Recent Lipid Panel ?   ?Component Value Date/Time  ? CHOL 239 (H) 12/24/2020 1154  ? TRIG 92 12/24/2020 1154  ? HDL 55 12/24/2020 1154  ? [place] 168 (H) 12/24/2020 1154  ? ? ? ?Risk Assessment/Calculations:   ? ? ?    ? ?Physical Exam:   ? ?VS:  BP 108/60 (BP Location: Left Arm, Patient Position: Sitting, Cuff Size: Large)   Pulse 68   Ht '5\' 6"'$  (1.676 m)   Wt 187 lb (84.8 kg)   SpO2 97%   BMI 30.18 kg/m?    ? ?Wt Readings from Last 3 Encounters:  ?12/22/21 187 lb (84.8 kg)  ?12/20/21 187 lb 3.2 oz (84.9 kg)  ?12/08/21 194 lb 6.4 oz (88.2 kg)  ?  ? ?GEN:  Well nourished, well developed in no acute distress ?HEENT: Normal ?NECK: No JVD; No carotid bruits ?CARDIAC: RRR, no murmurs, rubs, gallops ?RESPIRATORY: Decreased breath sounds, no wheezing. ?ABDOMEN: Soft, non-tender, non-distended ?MUSCULOSKELETAL:  1+ nonpitting edema;,?  Lymphedema ?SKIN: Warm and dry ?NEUROLOGIC:  Alert and oriented x 3 ?PSYCHIATRIC:  Normal affect  ? ?ASSESSMENT:   ? ?1. Coronary artery disease involving native coronary artery of native heart, unspecified whether angina present   ?2. Dyspnea on exertion   ?3. Pure hypercholesterolemia   ?4. Smoking   ?5. Leg edema   ? ?PLAN:   ? ?In order of problems listed above: ? ?CAD, CTO RCA.  Calcium score 790.  Denies chest pain.  Continue aspirin 81 mg daily, stop Lipitor due to leg cramps.  echo EF 60 to 65%, impaired relaxation.  No plans  for left heart cath, as patient has CTO, patient also has iodine allergies. ?Dyspnea on exertion, likely from COPD, OSA, current smoking   ?Hyperlipidemia, cramping with Lipitor.  Start Zetia 10 mg daily.

## 2021-12-26 ENCOUNTER — Encounter: Payer: Self-pay | Admitting: Cardiology

## 2021-12-26 ENCOUNTER — Telehealth: Payer: Self-pay

## 2021-12-26 ENCOUNTER — Other Ambulatory Visit
Admission: RE | Admit: 2021-12-26 | Discharge: 2021-12-26 | Disposition: A | Discharge status: Home / Self Care | Payer: Medicare Other | Source: Home / Self Care | Attending: Cardiology | Admitting: Cardiology

## 2021-12-26 ENCOUNTER — Ambulatory Visit: Payer: Medicare Other | Attending: Physician Assistant

## 2021-12-26 DIAGNOSIS — M6281 Muscle weakness (generalized): Secondary | ICD-10-CM | POA: Diagnosis not present

## 2021-12-26 DIAGNOSIS — R2681 Unsteadiness on feet: Secondary | ICD-10-CM | POA: Insufficient documentation

## 2021-12-26 DIAGNOSIS — R2689 Other abnormalities of gait and mobility: Secondary | ICD-10-CM | POA: Diagnosis not present

## 2021-12-26 DIAGNOSIS — E876 Hypokalemia: Secondary | ICD-10-CM | POA: Insufficient documentation

## 2021-12-26 DIAGNOSIS — E7800 Pure hypercholesterolemia, unspecified: Secondary | ICD-10-CM | POA: Diagnosis not present

## 2021-12-26 DIAGNOSIS — R600 Localized edema: Secondary | ICD-10-CM | POA: Insufficient documentation

## 2021-12-26 DIAGNOSIS — R262 Difficulty in walking, not elsewhere classified: Secondary | ICD-10-CM | POA: Insufficient documentation

## 2021-12-26 DIAGNOSIS — R269 Unspecified abnormalities of gait and mobility: Secondary | ICD-10-CM | POA: Diagnosis not present

## 2021-12-26 LAB — BASIC METABOLIC PANEL
Anion gap: 11 (ref 5–15)
BUN: 27 mg/dL — ABNORMAL HIGH (ref 8–23)
CO2: 31 mmol/L (ref 22–32)
Calcium: 10.1 mg/dL (ref 8.9–10.3)
Chloride: 96 mmol/L — ABNORMAL LOW (ref 98–111)
Creatinine, Ser: 0.78 mg/dL (ref 0.44–1.00)
GFR, Estimated: >60 mL/min (ref >60)
Glucose, Bld: 132 mg/dL — ABNORMAL HIGH (ref 70–99)
Potassium: 3.4 mmol/L — ABNORMAL LOW (ref 3.5–5.1)
Sodium: 138 mmol/L (ref 135–145)

## 2021-12-26 NOTE — Therapy (Signed)
[facility] ?[place], [place], [address] ?Phone: [phone]   Fax:  [phone] ? ?Physical Therapy Treatment ? ?Patient Details  ?Name: Amber Holder ?MRN: [id number] ?Date of Birth: [birth year] ?Referring Provider (PT): Drema Dallas PA ? ? ?Encounter Date: 12/26/2021 ? ? PT End of Session - 12/26/21 0937   ? ? Visit Number 16   ? Number of Visits 24   ? Date for PT Re-Evaluation 01/17/22   ? Authorization Type 6/10 eval 2/28   ? PT Start Time [phone]   ? PT Stop Time 1014   ? PT Time Calculation (min) 40 min   ? Equipment Utilized During Treatment Gait belt   ? Activity Tolerance Patient tolerated treatment well   ? Behavior During Therapy [facility] for tasks assessed/performed   ? ?  ?  ? ?  ? ? ?Past Medical History:  ?Diagnosis Date  ? Anxiety 01/02/2012  ? Arthritis   ? Asthma   ? Blockage of coronary artery of heart (HCC)   ? Chronic kidney disease   ? Kidney stones  ? Colon polyps   ? COPD (chronic obstructive pulmonary disease) ([place])   ? Diabetes mellitus without complication ([place])   ? GERD (gastroesophageal reflux disease)   ? HOH (hard of hearing)   ? Bilateral hearing aids  ? Kidney stone   ? Osteoporosis   ? Sleep apnea   ? Does not use C-PAP on a regular basis  ? Venous stasis   ? ? ?Past Surgical History:  ?Procedure Laterality Date  ? APPENDECTOMY    ? cataract surgery Bilateral   ? CHOLECYSTECTOMY    ? COLONOSCOPY    ? COLONOSCOPY WITH PROPOFOL N/A 05/28/2015  ? Procedure: COLONOSCOPY WITH PROPOFOL;  Surgeon: Lollie Sails, MD;  [facility]: [facility] ENDOSCOPY;  Service: Endoscopy;  Laterality: N/A;  ? COLONOSCOPY WITH PROPOFOL N/A 10/28/2018  ? Procedure: COLONOSCOPY WITH PROPOFOL;  Surgeon: Lollie Sails, MD;  Location: [facility] ENDOSCOPY;  Service: Endoscopy;  Laterality: N/A;  ? EYE SURGERY Bilateral 2015  ? Cataract Extraction with IOL  ? LITHOTRIPSY  2015  ? has had many stones  ? Mastoidotomy  1970  ? ROTATOR CUFF REPAIR Right 2011   ? TOTAL HIP ARTHROPLASTY Right 06/14/2016  ? Procedure: TOTAL HIP ARTHROPLASTY;  Surgeon: Dereck Leep, MD;  Location: [facility] ORS;  Service: Orthopedics;  Laterality: Right;  ? ? ?There were no vitals filed for this visit. ? ? Subjective Assessment - 12/26/21 0936   ? ? Subjective Patient found out her Potassium is greatly depleted. is having increased potassium and having more testing.   ? Pertinent History Patient is a 76 year old female who presents for LE weakness and balance deficits. PMH includes anxiety, arthritis, CKD, colon polyps, COPD, DM, GERD, HOH, Kidney stones, osteoporosis, sleep apnea, venous stasis. Lives alone in [place], [place]. She was originally from [place] but more recently from [place]. 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 She has three adult children who are healthy. Patient falls in the dark; had a fall at the beach last November as well. Patient has dizziness when driving on a bumpy rode. Limited in walking distance.   ? Limitations Lifting;Standing;Walking;House hold activities   ? How long can you stand comfortably? has difficulty washing dishes   ? How long can you walk comfortably? to mailbox   ? Patient Stated Goals to be able to walk; to improve balance   ?  Currently in Pain? No/denies   ? ?  ?  ? ?  ? ? ? ?BP: 116/71 ? ? ?Treatment:  ?  ?6" step  ?-step up alternating LE's 10x each LE no UE support ?-lateral step up 10x each LE;  ?-alternating toe taps 15x each LE  ? ?Standing with CGA next to support surface:  ?Airex pad: static stand 30 seconds x 2 trials, noticeable trembling of ankles/LE's with fatigue and challenge to maintain stability ?Airex pad: horizontal head turns 30 seconds scanning room 10x ; cueing for arc of motion  ?Airex pad: vertical head turns 30 seconds, cueing for arc of motion, noticeable sway with upward gaze increasing demand on ankle righting reaction musculature ?Airex pad: one foot on 6" step one foot on airex pad, hold position for 30  seconds, switch legs, 2x each LE; ? ?  ?Sit to stand 10x  ?  ?Seated: ?RTB around bilateral feet: march with df and feet apart 12x each LE  ?RTB hamstring curl 10x  ?RTB abduction 15x ?Adduction ball squeeze 10x 3 second holds ?  ?Pt educated throughout session about proper posture and technique with exercises. Improved exercise technique, movement at target joints, use of target muscles after min to mod verbal, visual, tactile cues. ? ? ? ? ?Patient presents with new diagnosis of potassium imbalance. Is highly motivated. Her oxygen is much lower this session with Spo2 ranging from 85-95% requiring her to have rest breaks to return to PLOF. She does have some increased dizziness today. Patient would benefit from additional skilled PT intervention to improve strength, balance and mobility ? ? ? ? ? ? ? ? ? ? ? ? ? ? ? ? ? ? PT Education - 12/26/21 0937   ? ? Education Details exercise technique, body mechanics   ? Person(s) Educated Patient   ? Methods Explanation;Tactile cues;Demonstration;Verbal cues   ? Comprehension Verbalized understanding;Returned demonstration;Verbal cues required;Tactile cues required   ? ?  ?  ? ?  ? ? ? PT Short Term Goals - 11/30/21 1110   ? ?  ? PT SHORT TERM GOAL #1  ? Title Patient will be independent in home exercise program to improve strength/mobility for better functional independence with ADLs.   ? Baseline 2/28: HEP given, 4/5: doing them 1x a day;   ? Time 4   ? Period Weeks   ? Status Achieved   ? Target Date 11/22/21   ?  ? PT SHORT TERM GOAL #2  ? Title Patient (> 51 years old) will complete five times sit to stand test in < 15 seconds indicating an increased LE strength and improved balance.   ? Baseline 2/28: 18.24 seconds hands on knees, 4/5: 18.6 sec without UE assist;   ? Time 4   ? Period Weeks   ? Status Partially Met   ? Target Date 11/22/21   ? ?  ?  ? ?  ? ? ? ? PT Long Term Goals - 11/30/21 1111   ? ?  ? PT LONG TERM GOAL #1  ? Title Patient will increase FOTO score  to equal to or greater than  51%   to demonstrate statistically significant improvement in mobility and quality of life   ? Baseline 2/28; 46%, 4/5: 49%   ? Time 12   ? Period Weeks   ? Status Partially Met   ? Target Date 01/17/22   ?  ? PT LONG TERM GOAL #2  ? Title Patient will  increase Berg Balance score by > 6 points (43/56)  to demonstrate decreased fall risk during functional activities.   ? Baseline 2/28: 37/56, 4/5: 44/56   ? Time 12   ? Period Weeks   ? Status Achieved   ? Target Date 01/17/22   ?  ? PT LONG TERM GOAL #3  ? Title Patient will increase six minute walk test distance to >1300 for progression to community age norm ambulator and improve gait ability   ? Baseline 900 feet on 11/04/21; 4/5: 1005 feet   ? Time 12   ? Period Weeks   ? Status Partially Met   ? Target Date 01/17/22   ?  ? PT LONG TERM GOAL #4  ? Title Patient will increase BLE gross strength to 4+/5 as to improve functional strength for independent gait, increased standing tolerance and increased ADL ability.   ? Baseline 2/28: see note, 4/5: see flowsheet   ? Time 12   ? Period Weeks   ? Status Partially Met   ? Target Date 01/17/22   ? ?  ?  ? ?  ? ? ? ? ? ? ? ? Plan - 12/26/21 1004   ? ? Clinical Impression Statement Patient presents with new diagnosis of potassium imbalance. Is highly motivated. Her oxygen is much lower this session with Spo2 ranging from 85-95% requiring her to have rest breaks to return to PLOF. She does have some increased dizziness today. Patient would benefit from additional skilled PT intervention to improve strength, balance and mobility   ? Personal Factors and Comorbidities Age;Comorbidity 3+;Fitness;Past/Current Experience;Social Background;Time since onset of injury/illness/exacerbation;Transportation   ? Comorbidities anxiety, arthritis, CKD, colon polyps, COPD, DM, GERD, HOH, Kidney stones, osteoporosis, sleep apnea, venous stasis   ? Examination-Activity Limitations Bathing;Bed Mobility;Bend;Caring  for Others;Carry;Locomotion Level;Lift;Hygiene/Grooming;Dressing;Reach Overhead;Sleep;Squat;Transfers;Toileting;Stand;Stairs   ? Examination-Participation Restrictions Church;Cleaning;Community Activity;D

## 2021-12-26 NOTE — Telephone Encounter (Signed)
Spoke with Dr. Garen Lah and he recommended the patient take KCL20 MEQ daily and get a repeat BMP in 2-3 weeks. ? ?Called patient and gave her the recommendations. She verbalized understanding and agreed with plan. ?

## 2021-12-27 ENCOUNTER — Encounter: Payer: Self-pay | Admitting: Internal Medicine

## 2021-12-27 ENCOUNTER — Ambulatory Visit (INDEPENDENT_AMBULATORY_CARE_PROVIDER_SITE_OTHER): Payer: Medicare Other | Admitting: Internal Medicine

## 2021-12-27 VITALS — BP 120/61 | HR 72 | Temp 97.6°F | Resp 16 | Ht 66.0 in | Wt 188.2 lb

## 2021-12-27 DIAGNOSIS — E1165 Type 2 diabetes mellitus with hyperglycemia: Secondary | ICD-10-CM

## 2021-12-27 DIAGNOSIS — J449 Chronic obstructive pulmonary disease, unspecified: Secondary | ICD-10-CM

## 2021-12-27 DIAGNOSIS — F17210 Nicotine dependence, cigarettes, uncomplicated: Secondary | ICD-10-CM | POA: Diagnosis not present

## 2021-12-27 DIAGNOSIS — M7989 Other specified soft tissue disorders: Secondary | ICD-10-CM

## 2021-12-27 MED ORDER — BUPROPION HCL ER (XL) 150 MG PO TB24: 150.0000 mg | ORAL_TABLET | Freq: Every day | ORAL | 3 refills | Status: DC

## 2021-12-27 NOTE — Progress Notes (Signed)
Thank you [facility] ?[address] ?[place], [place] [address] ? ?Internal MEDICINE  ?Office Visit Note ? ?Patient Name: Amber Holder ? [id number]  ?[id number] ? ?Date of Service: 01/10/2022 ? ?Chief Complaint  ?Patient presents with  ? Follow-up  ? Diabetes  ? Gastroesophageal Reflux  ? ? ?HPI ?Patient is here for routine follow-up. ?Patient had a recent CT of the chest done has evidence of bronchiectasis she was given samples of Breztri however complains of having dryness in her mouth and would like to continue on Spiriva. ?Patient is also complaining of bilateral lower extremity edema and has been started on torsemide and metolazone by the cardiology and has appointment with vein clinic questionable peripheral artery disease. ?Patient is taking metformin for her diabetes but fasting glucose has been elevated ? ?Patient is  interested in smoking cessation ?Current Medication: ?Outpatient Encounter Medications as of 12/27/2021  ?Medication Sig  ? acetaminophen (TYLENOL) 500 MG chewable tablet Chew 1,000 mg by mouth every 8 (eight) hours as needed for pain.  ? albuterol (VENTOLIN HFA) 108 (90 Base) MCG/ACT inhaler Inhale 2 puffs into the lungs every 6 (six) hours as needed for wheezing or shortness of breath.  ? aspirin EC 81 MG tablet Take 1 tablet (81 mg total) by mouth daily. Swallow whole.  ? buPROPion (WELLBUTRIN XL) 150 MG 24 hr tablet Take 1 tablet (150 mg total) by mouth daily.  ? cholecalciferol (VITAMIN D) 400 UNITS TABS tablet Take 2,000 Units by mouth daily.   ? CRANBERRY PO Take 25,000 mg by mouth daily.  ? ergocalciferol (DRISDOL) 1.25 MG (50000 UT) capsule Take one cap q week  ? ezetimibe (ZETIA) 10 MG tablet Take 1 tablet (10 mg total) by mouth daily.  ? fluticasone (FLOVENT HFA) 110 MCG/ACT inhaler Inhale 2 puffs into the lungs 2 (two) times daily as needed.  ? glucose blood (ONETOUCH VERIO) test strip BLOOD SUGAR TESTING ONCE DAILY . DX E11.65  ? hydrocortisone cream 0.5 % Apply topically  as needed.   ? ibandronate (BONIVA) 150 MG tablet Take 1 tablet (150 mg total) by mouth every 30 (thirty) days.  ? Lancets (ONETOUCH DELICA PLUS KNLZJQ73A) MISC Use  as directed twice a daily DX E11.65  ? Magnesium 250 MG TABS Take by mouth. Takes 1 tablet 2-3 times per week  ? metFORMIN (GLUCOPHAGE) 500 MG tablet Take 1 tablet (500 mg total) by mouth 2 (two) times daily with a meal. May take a half tablet.  ? metolazone (ZAROXOLYN) 5 MG tablet Take 0.5 tablets (2.5 mg total) by mouth daily. Take 30 mins prior to your Torsemide.  ? montelukast (SINGULAIR) 10 MG tablet Take 10 mg by mouth daily as needed.  ? Multiple Vitamins-Minerals (MULTIVITAMIN ADULTS PO) Take 1 tablet by mouth daily.  ? omeprazole (PRILOSEC) 40 MG capsule Take 1 capsule (40 mg total) by mouth daily.  ? Simethicone 80 MG TABS Take 1 tablet (80 mg total) by mouth as needed (burping). As directed.  ? SPIRIVA HANDIHALER 18 MCG inhalation capsule PLACE 1 CAPSULE (18 MCG TOTAL) INTO INHALER AND INHALE DAILY. FOR COPD *DO NOT SWALLOW  ? torsemide (DEMADEX) 20 MG tablet Take 1 tablet (20 mg total) by mouth daily.  ? [DISCONTINUED] potassium chloride SA (KLOR-CON M) 20 MEQ tablet Take 1 tablet (20 mEq total) by mouth daily. Take 2 tablets (40 MEQ) once a day for 5 days, then take 1 tablet (20 MEQ) once a day.  ? ?No facility-administered encounter medications on file as  of 12/27/2021.  ? ? ?Surgical History: ?Past Surgical History:  ?Procedure Laterality Date  ? APPENDECTOMY    ? cataract surgery Bilateral   ? CHOLECYSTECTOMY    ? COLONOSCOPY    ? COLONOSCOPY WITH PROPOFOL N/A 05/28/2015  ? Procedure: COLONOSCOPY WITH PROPOFOL;  Surgeon: Lollie Sails, MD;  [facility] ENDOSCOPY;  Service: Endoscopy;  Laterality: N/A;  ? COLONOSCOPY WITH PROPOFOL N/A 10/28/2018  ? Procedure: COLONOSCOPY WITH PROPOFOL;  Surgeon: Lollie Sails, MD;  Location: [facility] ENDOSCOPY;  Service: Endoscopy;  Laterality: N/A;  ? EYE SURGERY Bilateral 2015  ? Cataract Extraction  with IOL  ? LITHOTRIPSY  2015  ? has had many stones  ? Mastoidotomy  1970  ? ROTATOR CUFF REPAIR Right 2011  ? TOTAL HIP ARTHROPLASTY Right 06/14/2016  ? Procedure: TOTAL HIP ARTHROPLASTY;  Surgeon: Dereck Leep, MD;  Location: [facility] ORS;  Service: Orthopedics;  Laterality: Right;  ? ? ?Medical History: ?Past Medical History:  ?Diagnosis Date  ? Anxiety 01/02/2012  ? Arthritis   ? Asthma   ? Blockage of coronary artery of heart (HCC)   ? Chronic kidney disease   ? Kidney stones  ? Colon polyps   ? COPD (chronic obstructive pulmonary disease) ([place])   ? Diabetes mellitus without complication ([place])   ? GERD (gastroesophageal reflux disease)   ? HOH (hard of hearing)   ? Bilateral hearing aids  ? Kidney stone   ? Osteoporosis   ? Sleep apnea   ? Does not use C-PAP on a regular basis  ? Venous stasis   ? ? ?Family History: ?Family History  ?Problem Relation Age of Onset  ? Breast cancer Maternal Aunt 79  ? Bladder Cancer Neg Hx   ? Kidney cancer Neg Hx   ? ? ?Social History  ? ?Socioeconomic History  ? Marital status: Divorced  ?  Spouse name: Not on file  ? Number of children: Not on file  ? Years of education: Not on file  ? Highest education level: Not on file  ?Occupational History  ? Not on file  ?Tobacco Use  ? Smoking status: Former  ?  Packs/day: 1.00  ?  Types: Cigarettes  ? Smokeless tobacco: Never  ? Tobacco comments:  ?  1 pack daily//quit 2 weeks ago  ?Vaping Use  ? Vaping Use: Never used  ?Substance and Sexual Activity  ? Alcohol use: Not Currently  ?  Comment: very rarely   ? Drug use: No  ? Sexual activity: Not Currently  ?Other Topics Concern  ? Not on file  ?Social History Narrative  ? Not on file  ? ?Social Determinants of Health  ? ?Financial Resource Strain: Not on file  ?Food Insecurity: Not on file  ?Transportation Needs: Not on file  ?Physical Activity: Not on file  ?Stress: Not on file  ?Social Connections: Not on file  ?Intimate Partner Violence: Not on file  ? ? ? ? ?Review of Systems   ?Constitutional:  Negative for chills, fatigue and unexpected weight change.  ?HENT:  Positive for postnasal drip. Negative for congestion, rhinorrhea, sneezing and sore throat.   ?Eyes:  Negative for redness.  ?Respiratory:  Positive for shortness of breath. Negative for cough and chest tightness.   ?Cardiovascular:  Positive for leg swelling. Negative for chest pain and palpitations.  ?Gastrointestinal:  Negative for abdominal pain, constipation, diarrhea, nausea and vomiting.  ?Genitourinary:  Negative for dysuria and frequency.  ?Musculoskeletal:  Negative for arthralgias, back pain, joint  swelling and neck pain.  ?Skin:  Negative for rash.  ?Neurological: Negative.  Negative for tremors and numbness.  ?Hematological:  Negative for adenopathy. Does not bruise/bleed easily.  ?Psychiatric/Behavioral:  Negative for behavioral problems (Depression), sleep disturbance and suicidal ideas. The patient is not nervous/anxious.   ? ?Vital Signs: ?BP 120/61   Pulse 72   Temp 97.6 ?F (36.4 ?C)   Resp 16   Ht '5\' 6"'$  (1.676 m)   Wt 188 lb 3.2 oz (85.4 kg)   SpO2 93%   BMI 30.38 kg/m?  ? ? ?Physical Exam ?Constitutional:   ?   Appearance: Normal appearance.  ?HENT:  ?   Head: Normocephalic and atraumatic.  ?   Nose: Nose normal.  ?   Mouth/Throat:  ?   Mouth: Mucous membranes are moist.  ?   Pharynx: No posterior oropharyngeal erythema.  ?Eyes:  ?   Extraocular Movements: Extraocular movements intact.  ?   Pupils: Pupils are equal, round, and reactive to light.  ?Cardiovascular:  ?   Pulses: Normal pulses.  ?   Heart sounds: Normal heart sounds.  ?Pulmonary:  ?   Effort: Pulmonary effort is normal.  ?   Breath sounds: Normal breath sounds.  ?Musculoskeletal:  ?   Right lower leg: Edema present.  ?   Left lower leg: Edema present.  ?Neurological:  ?   General: No focal deficit present.  ?   Mental Status: She is alert.  ?Psychiatric:     ?   Mood and Affect: Mood normal.     ?   Behavior: Behavior normal.   ? ? ? ? ? ?Assessment/Plan: ?1. Leg swelling ?Pt will need to see vascular ?- POCT ABI Screening Pilot No Charge ?- US ARTERIAL LOWER EXTREMITY DUPLEX BILATERAL; Future ? ?2. Cigarette nicotine dependence without complication ?W

## 2021-12-28 ENCOUNTER — Ambulatory Visit: Payer: Medicare Other

## 2021-12-28 DIAGNOSIS — R262 Difficulty in walking, not elsewhere classified: Secondary | ICD-10-CM | POA: Diagnosis not present

## 2021-12-28 DIAGNOSIS — R269 Unspecified abnormalities of gait and mobility: Secondary | ICD-10-CM | POA: Diagnosis not present

## 2021-12-28 DIAGNOSIS — M6281 Muscle weakness (generalized): Secondary | ICD-10-CM

## 2021-12-28 DIAGNOSIS — E7800 Pure hypercholesterolemia, unspecified: Secondary | ICD-10-CM | POA: Diagnosis not present

## 2021-12-28 DIAGNOSIS — R2681 Unsteadiness on feet: Secondary | ICD-10-CM | POA: Diagnosis not present

## 2021-12-28 DIAGNOSIS — E876 Hypokalemia: Secondary | ICD-10-CM | POA: Diagnosis not present

## 2021-12-28 NOTE — Therapy (Signed)
[facility] ?[place], [place], [address] ?Phone: [phone]   Fax:  [phone] ? ?Physical Therapy Treatment ? ?Patient Details  ?Name: Amber Holder ?MRN: [id number] ?Date of Birth: 31-Jul-1946 ?Referring Provider (PT): Drema Dallas PA ? ? ?Encounter Date: 12/28/2021 ? ? PT End of Session - 12/28/21 1005   ? ? Visit Number 17   ? Number of Visits 24   ? Date for PT Re-Evaluation 01/17/22   ? Authorization Type 7/10 eval 2/28   ? PT Start Time [phone]   ? PT Stop Time 1015   ? PT Time Calculation (min) 38 min   ? Equipment Utilized During Treatment Gait belt   ? Activity Tolerance Patient tolerated treatment well   ? Behavior During Therapy [facility] for tasks assessed/performed   ? ?  ?  ? ?  ? ? ?Past Medical History:  ?Diagnosis Date  ? Anxiety 01/02/2012  ? Arthritis   ? Asthma   ? Blockage of coronary artery of heart (HCC)   ? Chronic kidney disease   ? Kidney stones  ? Colon polyps   ? COPD (chronic obstructive pulmonary disease) ([place])   ? Diabetes mellitus without complication ([place])   ? GERD (gastroesophageal reflux disease)   ? HOH (hard of hearing)   ? Bilateral hearing aids  ? Kidney stone   ? Osteoporosis   ? Sleep apnea   ? Does not use C-PAP on a regular basis  ? Venous stasis   ? ? ?Past Surgical History:  ?Procedure Laterality Date  ? APPENDECTOMY    ? cataract surgery Bilateral   ? CHOLECYSTECTOMY    ? COLONOSCOPY    ? COLONOSCOPY WITH PROPOFOL N/A 05/28/2015  ? Procedure: COLONOSCOPY WITH PROPOFOL;  Surgeon: Lollie Sails, MD;  Location: [facility] ENDOSCOPY;  Service: Endoscopy;  Laterality: N/A;  ? COLONOSCOPY WITH PROPOFOL N/A 10/28/2018  ? Procedure: COLONOSCOPY WITH PROPOFOL;  Surgeon: Lollie Sails, MD;  Location: [facility] ENDOSCOPY;  Service: Endoscopy;  Laterality: N/A;  ? EYE SURGERY Bilateral 2015  ? Cataract Extraction with IOL  ? LITHOTRIPSY  2015  ? has had many stones  ? Mastoidotomy  1970  ? ROTATOR CUFF REPAIR Right 2011   ? TOTAL HIP ARTHROPLASTY Right 06/14/2016  ? Procedure: TOTAL HIP ARTHROPLASTY;  Surgeon: Dereck Leep, MD;  Location: [facility] ORS;  Service: Orthopedics;  Laterality: Right;  ? ? ?There were no vitals filed for this visit. ? ? Subjective Assessment - 12/28/21 0939   ? ? Subjective Patient reports her lungs are clear of cancer but was diagnosed with emphysema.   ? Pertinent History Patient is a 76 year old female who presents for LE weakness and balance deficits. PMH includes anxiety, arthritis, CKD, colon polyps, COPD, DM, GERD, HOH, Kidney stones, osteoporosis, sleep apnea, venous stasis. Lives alone in [place], [place]. She was originally from [place] but more recently from [place]. 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 She has three adult children who are healthy. Patient falls in the dark; had a fall at the beach last November as well. Patient has dizziness when driving on a bumpy rode. Limited in walking distance.   ? Limitations Lifting;Standing;Walking;House hold activities   ? How long can you stand comfortably? has difficulty washing dishes   ? How long can you walk comfortably? to mailbox   ? Patient Stated Goals to be able to walk; to improve balance   ? Currently in Pain?  No/denies   ? ?  ?  ? ?  ? ? ? ? ? ? ? ?Treatment:  ?  ?Speed ladder  ?-one foot each square 6x length ?-lateral step one foot each square 6x length ?-diagonal stepping 6x length ? ?Airex pad: reaching outside BOS to color coordinate colors with cross body reaches x 4 minutes ? ?Sit to stand 10x  ?  ?TRX squat with chair behind patient 10x ?SLS 30 seconds each LE  ? ?Ambulate 160 ft x 2 trials; monitor Sp02: started at 91% raised to 95% then dropped to 90% on second lap ? ?Seated: ?RTB march 15x each LE ?RTB abduction 15x  ?RTB around bilateral feet: march with df and feet apart 12x each LE  ?RTB hamstring curl 10x  ? ?BP at end of session: 122/68  ? ?  ?Pt educated throughout session about proper posture and technique  with exercises. Improved exercise technique, movement at target joints, use of target muscles after min to mod verbal, visual, tactile cues. ? ? ? ?Patient continues to require monitoring of vitals due to SP02 dropping to 86% with interventions. Lateral stepping increases her Spo2 to 96% but TRX squats lower it to 88%.. Patient oxygen monitored during ambulation with results in notes section.  Patient would benefit from additional skilled PT intervention to improve strength, balance and mobility ? ? ? ? ? ? ? ? ? ? ? ? ? ? ? ? ? PT Education - 12/28/21 0940   ? ? Education Details exercise technique, body mechanics   ? Person(s) Educated Patient   ? Methods Explanation;Demonstration;Tactile cues;Verbal cues   ? Comprehension Verbalized understanding;Returned demonstration;Verbal cues required;Tactile cues required   ? ?  ?  ? ?  ? ? ? PT Short Term Goals - 11/30/21 1110   ? ?  ? PT SHORT TERM GOAL #1  ? Title Patient will be independent in home exercise program to improve strength/mobility for better functional independence with ADLs.   ? Baseline 2/28: HEP given, 4/5: doing them 1x a day;   ? Time 4   ? Period Weeks   ? Status Achieved   ? Target Date 11/22/21   ?  ? PT SHORT TERM GOAL #2  ? Title Patient (> 33 years old) will complete five times sit to stand test in < 15 seconds indicating an increased LE strength and improved balance.   ? Baseline 2/28: 18.24 seconds hands on knees, 4/5: 18.6 sec without UE assist;   ? Time 4   ? Period Weeks   ? Status Partially Met   ? Target Date 11/22/21   ? ?  ?  ? ?  ? ? ? ? PT Long Term Goals - 11/30/21 1111   ? ?  ? PT LONG TERM GOAL #1  ? Title Patient will increase FOTO score to equal to or greater than  51%   to demonstrate statistically significant improvement in mobility and quality of life   ? Baseline 2/28; 46%, 4/5: 49%   ? Time 12   ? Period Weeks   ? Status Partially Met   ? Target Date 01/17/22   ?  ? PT LONG TERM GOAL #2  ? Title Patient will increase Berg  Balance score by > 6 points (43/56)  to demonstrate decreased fall risk during functional activities.   ? Baseline 2/28: 37/56, 4/5: 44/56   ? Time 12   ? Period Weeks   ? Status Achieved   ?  Target Date 01/17/22   ?  ? PT LONG TERM GOAL #3  ? Title Patient will increase six minute walk test distance to >1300 for progression to community age norm ambulator and improve gait ability   ? Baseline 900 feet on 11/04/21; 4/5: 1005 feet   ? Time 12   ? Period Weeks   ? Status Partially Met   ? Target Date 01/17/22   ?  ? PT LONG TERM GOAL #4  ? Title Patient will increase BLE gross strength to 4+/5 as to improve functional strength for independent gait, increased standing tolerance and increased ADL ability.   ? Baseline 2/28: see note, 4/5: see flowsheet   ? Time 12   ? Period Weeks   ? Status Partially Met   ? Target Date 01/17/22   ? ?  ?  ? ?  ? ? ? ? ? ? ? ? Plan - 12/28/21 1004   ? ? Clinical Impression Statement Patient continues to require monitoring of vitals due to SP02 dropping to 86% with interventions. Lateral stepping increases her Spo2 to 96% but TRX squats lower it to 88%.. Patient oxygen monitored during ambulation with results in notes section.  Patient would benefit from additional skilled PT intervention to improve strength, balance and mobility   ? Personal Factors and Comorbidities Age;Comorbidity 3+;Fitness;Past/Current Experience;Social Background;Time since onset of injury/illness/exacerbation;Transportation   ? Comorbidities anxiety, arthritis, CKD, colon polyps, COPD, DM, GERD, HOH, Kidney stones, osteoporosis, sleep apnea, venous stasis   ? Examination-Activity Limitations Bathing;Bed Mobility;Bend;Caring for Others;Carry;Locomotion Level;Lift;Hygiene/Grooming;Dressing;Reach Overhead;Sleep;Squat;Transfers;Toileting;Stand;Stairs   ? Examination-Participation Restrictions Church;Cleaning;Community Activity;Driving;Meal Prep;Laundry;Interpersonal Relationship;Personal Finances;Shop;Volunteer;[place]  Work   ? Stability/Clinical Decision Making Evolving/Moderate complexity   ? Rehab Potential Fair   ? PT Frequency 2x / week   ? PT Duration 12 weeks   ? PT Treatment/Interventions ADLs/Self Care Home Manageme

## 2022-01-02 ENCOUNTER — Ambulatory Visit: Payer: Medicare Other

## 2022-01-02 ENCOUNTER — Other Ambulatory Visit: Payer: Self-pay

## 2022-01-02 DIAGNOSIS — R269 Unspecified abnormalities of gait and mobility: Secondary | ICD-10-CM | POA: Diagnosis not present

## 2022-01-02 DIAGNOSIS — R262 Difficulty in walking, not elsewhere classified: Secondary | ICD-10-CM

## 2022-01-02 DIAGNOSIS — R2681 Unsteadiness on feet: Secondary | ICD-10-CM | POA: Diagnosis not present

## 2022-01-02 DIAGNOSIS — E7800 Pure hypercholesterolemia, unspecified: Secondary | ICD-10-CM | POA: Diagnosis not present

## 2022-01-02 DIAGNOSIS — M6281 Muscle weakness (generalized): Secondary | ICD-10-CM | POA: Diagnosis not present

## 2022-01-02 DIAGNOSIS — E876 Hypokalemia: Secondary | ICD-10-CM | POA: Diagnosis not present

## 2022-01-02 NOTE — Therapy (Signed)
[facility] ?[place], [place], [address] ?Phone: [phone]   Fax:  [phone] ? ?Physical Therapy Treatment ? ?Patient Details  ?Name: Amber Holder ?MRN: [id number] ?Date of Birth: [birth year] ?Referring Provider (PT): Drema Dallas PA ? ? ?Encounter Date: 01/02/2022 ? ? PT End of Session - 01/02/22 9924   ? ? Visit Number 18   ? Number of Visits 24   ? Date for PT Re-Evaluation 01/17/22   ? Authorization Type 8/10 eval 2/28   ? PT Start Time [phone]   ? PT Stop Time 1014   ? PT Time Calculation (min) 40 min   ? Equipment Utilized During Treatment Gait belt   ? Activity Tolerance Patient tolerated treatment well   ? Behavior During Therapy [facility] for tasks assessed/performed   ? ?  ?  ? ?  ? ? ?Past Medical History:  ?Diagnosis Date  ? Anxiety 01/02/2012  ? Arthritis   ? Asthma   ? Blockage of coronary artery of heart (HCC)   ? Chronic kidney disease   ? Kidney stones  ? Colon polyps   ? COPD (chronic obstructive pulmonary disease) ([place])   ? Diabetes mellitus without complication ([place])   ? GERD (gastroesophageal reflux disease)   ? HOH (hard of hearing)   ? Bilateral hearing aids  ? Kidney stone   ? Osteoporosis   ? Sleep apnea   ? Does not use C-PAP on a regular basis  ? Venous stasis   ? ? ?Past Surgical History:  ?Procedure Laterality Date  ? APPENDECTOMY    ? cataract surgery Bilateral   ? CHOLECYSTECTOMY    ? COLONOSCOPY    ? COLONOSCOPY WITH PROPOFOL N/A 05/28/2015  ? Procedure: COLONOSCOPY WITH PROPOFOL;  Surgeon: Lollie Sails, MD;  Location: [facility] ENDOSCOPY;  Service: Endoscopy;  Laterality: N/A;  ? COLONOSCOPY WITH PROPOFOL N/A 10/28/2018  ? Procedure: COLONOSCOPY WITH PROPOFOL;  Surgeon: Lollie Sails, MD;  Location: [facility] ENDOSCOPY;  Service: Endoscopy;  Laterality: N/A;  ? EYE SURGERY Bilateral 2015  ? Cataract Extraction with IOL  ? LITHOTRIPSY  2015  ? has had many stones  ? Mastoidotomy  1970  ? ROTATOR CUFF REPAIR Right 2011   ? TOTAL HIP ARTHROPLASTY Right 06/14/2016  ? Procedure: TOTAL HIP ARTHROPLASTY;  Surgeon: Dereck Leep, MD;  Location: [facility] ORS;  Service: Orthopedics;  Laterality: Right;  ? ? ?There were no vitals filed for this visit. ? ? Subjective Assessment - 01/02/22 0937   ? ? Subjective Patient reports her breathing is still having issues. Had a fall since last session. Was going down a hill.   ? Pertinent History Patient is a 76 year old female who presents for LE weakness and balance deficits. PMH includes anxiety, arthritis, CKD, colon polyps, COPD, DM, GERD, HOH, Kidney stones, osteoporosis, sleep apnea, venous stasis. Lives alone in [place], [place]. She was originally from [place] but more recently from [place]. 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 She has three adult children who are healthy. Patient falls in the dark; had a fall at the beach last November as well. Patient has dizziness when driving on a bumpy rode. Limited in walking distance.   ? Limitations Lifting;Standing;Walking;House hold activities   ? How long can you stand comfortably? has difficulty washing dishes   ? How long can you walk comfortably? to mailbox   ? Patient Stated Goals to be able to walk; to improve balance   ?  Currently in Pain? No/denies   ? ?  ?  ? ?  ? ? ? ? ? ? ? ?Treatment:  ? 6 cones: ?-weave between cones x 2 trials ?-forward/backwards weaving between cones x 2 trials ?-lateral step with toe tap 2x each cone x 2 trials ?-squat and pick up cones  ? ?Standing with CGA next to support surface:  ?Airex pad: static stand 30 seconds x 2 trials, noticeable trembling of ankles/LE's with fatigue and challenge to maintain stability ?Airex pad: horizontal head turns 30 seconds scanning room 10x ; cueing for arc of motion  ?Airex pad: vertical head turns 30 seconds, cueing for arc of motion, noticeable sway with upward gaze increasing demand on ankle righting reaction musculature ?Airex pad: one foot on 6" step one foot on  airex pad, hold position for 30 seconds, switch legs, 2x each LE; ? ?  ?Sit to stand 10x ; spo2 drop to 85%  ?Modified lunge with UE support 10x each LE  ?Standing heel raise 20x  ?  ?Seated: ?RTB abduction 15x  ?RTB around bilateral feet: march with df and feet apart 12x each LE  ?Adduction ball squeeze 10x 3 second holds ? ? ?  ? ?  ?  ?Pt educated throughout session about proper posture and technique with exercises. Improved exercise technique, movement at target joints, use of target muscles after min to mod verbal, visual, tactile cues. ? ? ?Patient's SP02 remains lowered requiring additional rest breaks to return to therapeutic range. Patient is more unstable this session with single limb balance. Unstable surfaces are improving however with good ankle righting reactions with both feet on the ground. Patient would benefit from additional skilled PT intervention to improve strength, balance and mobility ? ? ? ? ? ? ? ? ? ? ? ? ? ? ? ? ? ? PT Education - 01/02/22 0938   ? ? Education Details exercise technique, body mechanics   ? Person(s) Educated Patient   ? Methods Explanation;Demonstration;Tactile cues;Verbal cues   ? Comprehension Returned demonstration;Verbal cues required;Verbalized understanding;Tactile cues required   ? ?  ?  ? ?  ? ? ? PT Short Term Goals - 11/30/21 1110   ? ?  ? PT SHORT TERM GOAL #1  ? Title Patient will be independent in home exercise program to improve strength/mobility for better functional independence with ADLs.   ? Baseline 2/28: HEP given, 4/5: doing them 1x a day;   ? Time 4   ? Period Weeks   ? Status Achieved   ? Target Date 11/22/21   ?  ? PT SHORT TERM GOAL #2  ? Title Patient (> 11 years old) will complete five times sit to stand test in < 15 seconds indicating an increased LE strength and improved balance.   ? Baseline 2/28: 18.24 seconds hands on knees, 4/5: 18.6 sec without UE assist;   ? Time 4   ? Period Weeks   ? Status Partially Met   ? Target Date 11/22/21   ? ?   ?  ? ?  ? ? ? ? PT Long Term Goals - 11/30/21 1111   ? ?  ? PT LONG TERM GOAL #1  ? Title Patient will increase FOTO score to equal to or greater than  51%   to demonstrate statistically significant improvement in mobility and quality of life   ? Baseline 2/28; 46%, 4/5: 49%   ? Time 12   ? Period Weeks   ? Status  Partially Met   ? Target Date 01/17/22   ?  ? PT LONG TERM GOAL #2  ? Title Patient will increase Berg Balance score by > 6 points (43/56)  to demonstrate decreased fall risk during functional activities.   ? Baseline 2/28: 37/56, 4/5: 44/56   ? Time 12   ? Period Weeks   ? Status Achieved   ? Target Date 01/17/22   ?  ? PT LONG TERM GOAL #3  ? Title Patient will increase six minute walk test distance to >1300 for progression to community age norm ambulator and improve gait ability   ? Baseline 900 feet on 11/04/21; 4/5: 1005 feet   ? Time 12   ? Period Weeks   ? Status Partially Met   ? Target Date 01/17/22   ?  ? PT LONG TERM GOAL #4  ? Title Patient will increase BLE gross strength to 4+/5 as to improve functional strength for independent gait, increased standing tolerance and increased ADL ability.   ? Baseline 2/28: see note, 4/5: see flowsheet   ? Time 12   ? Period Weeks   ? Status Partially Met   ? Target Date 01/17/22   ? ?  ?  ? ?  ? ? ? ? ? ? ? ? Plan - 01/02/22 1000   ? ? Clinical Impression Statement Patient's SP02 remains lowered requiring additional rest breaks to return to therapeutic range. Patient is more unstable this session with single limb balance. Unstable surfaces are improving however with good ankle righting reactions with both feet on the ground. Patient would benefit from additional skilled PT intervention to improve strength, balance and mobility   ? Personal Factors and Comorbidities Age;Comorbidity 3+;Fitness;Past/Current Experience;Social Background;Time since onset of injury/illness/exacerbation;Transportation   ? Comorbidities anxiety, arthritis, CKD, colon polyps, COPD,  DM, GERD, HOH, Kidney stones, osteoporosis, sleep apnea, venous stasis   ? Examination-Activity Limitations Bathing;Bed Mobility;Bend;Caring for Others;Carry;Locomotion Level;Lift;Hygiene/Grooming;Dressi

## 2022-01-04 ENCOUNTER — Ambulatory Visit: Payer: Medicare Other

## 2022-01-04 ENCOUNTER — Other Ambulatory Visit
Admission: RE | Admit: 2022-01-04 | Discharge: 2022-01-04 | Disposition: A | Discharge status: Home / Self Care | Payer: Medicare Other | Source: Ambulatory Visit | Attending: Cardiology | Admitting: Cardiology

## 2022-01-04 DIAGNOSIS — R2681 Unsteadiness on feet: Secondary | ICD-10-CM | POA: Diagnosis not present

## 2022-01-04 DIAGNOSIS — E876 Hypokalemia: Secondary | ICD-10-CM | POA: Insufficient documentation

## 2022-01-04 DIAGNOSIS — R269 Unspecified abnormalities of gait and mobility: Secondary | ICD-10-CM

## 2022-01-04 DIAGNOSIS — M6281 Muscle weakness (generalized): Secondary | ICD-10-CM

## 2022-01-04 DIAGNOSIS — R262 Difficulty in walking, not elsewhere classified: Secondary | ICD-10-CM

## 2022-01-04 DIAGNOSIS — E7800 Pure hypercholesterolemia, unspecified: Secondary | ICD-10-CM | POA: Diagnosis not present

## 2022-01-04 LAB — BASIC METABOLIC PANEL
Anion gap: 14 (ref 5–15)
BUN: 33 mg/dL — ABNORMAL HIGH (ref 8–23)
CO2: 29 mmol/L (ref 22–32)
Calcium: 9.1 mg/dL (ref 8.9–10.3)
Chloride: 94 mmol/L — ABNORMAL LOW (ref 98–111)
Creatinine, Ser: 0.96 mg/dL (ref 0.44–1.00)
GFR, Estimated: >60 mL/min (ref >60)
Glucose, Bld: 127 mg/dL — ABNORMAL HIGH (ref 70–99)
Potassium: 3 mmol/L — ABNORMAL LOW (ref 3.5–5.1)
Sodium: 137 mmol/L (ref 135–145)

## 2022-01-04 NOTE — Therapy (Signed)
[facility] ?[place], [place], [address] ?Phone: [phone]   Fax:  [phone] ? ?Physical Therapy Treatment ? ?Patient Details  ?Name: Amber Holder ?MRN: [id number] ?Date of Birth: [birth year] ?Referring Provider (PT): Drema Dallas PA ? ? ?Encounter Date: 01/04/2022 ? ? PT End of Session - 01/04/22 0936   ? ? Visit Number 19   ? Number of Visits 24   ? Date for PT Re-Evaluation 01/17/22   ? Authorization Type 9/10 eval 2/28   ? PT Start Time 0930   ? PT Stop Time 1014   ? PT Time Calculation (min) 44 min   ? Equipment Utilized During Treatment Gait belt   ? Activity Tolerance Patient tolerated treatment well   ? Behavior During Therapy [facility] for tasks assessed/performed   ? ?  ?  ? ?  ? ? ?Past Medical History:  ?Diagnosis Date  ? Anxiety 01/02/2012  ? Arthritis   ? Asthma   ? Blockage of coronary artery of heart (HCC)   ? Chronic kidney disease   ? Kidney stones  ? Colon polyps   ? COPD (chronic obstructive pulmonary disease) ([place])   ? Diabetes mellitus without complication ([place])   ? GERD (gastroesophageal reflux disease)   ? HOH (hard of hearing)   ? Bilateral hearing aids  ? Kidney stone   ? Osteoporosis   ? Sleep apnea   ? Does not use C-PAP on a regular basis  ? Venous stasis   ? ? ?Past Surgical History:  ?Procedure Laterality Date  ? APPENDECTOMY    ? cataract surgery Bilateral   ? CHOLECYSTECTOMY    ? COLONOSCOPY    ? COLONOSCOPY WITH PROPOFOL N/A 05/28/2015  ? Procedure: COLONOSCOPY WITH PROPOFOL;  Surgeon: Lollie Sails, MD;  Location: [facility] ENDOSCOPY;  Service: Endoscopy;  Laterality: N/A;  ? COLONOSCOPY WITH PROPOFOL N/A 10/28/2018  ? Procedure: COLONOSCOPY WITH PROPOFOL;  Surgeon: Lollie Sails, MD;  Location: [facility] ENDOSCOPY;  Service: Endoscopy;  Laterality: N/A;  ? EYE SURGERY Bilateral 2015  ? Cataract Extraction with IOL  ? LITHOTRIPSY  2015  ? has had many stones  ? Mastoidotomy  1970  ? ROTATOR CUFF REPAIR Right 2011   ? TOTAL HIP ARTHROPLASTY Right 06/14/2016  ? Procedure: TOTAL HIP ARTHROPLASTY;  Surgeon: Dereck Leep, MD;  Location: [facility] ORS;  Service: Orthopedics;  Laterality: Right;  ? ? ?There were no vitals filed for this visit. ? ? Subjective Assessment - 01/04/22 0935   ? ? Subjective Patient reports she walked the driveway since last session. No falls or LOB since last session.   ? Pertinent History Patient is a 76 year old female who presents for LE weakness and balance deficits. PMH includes anxiety, arthritis, CKD, colon polyps, COPD, DM, GERD, HOH, Kidney stones, osteoporosis, sleep apnea, venous stasis. Lives alone in [place], [place]. She was originally from [place] but more recently from [place]. 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 She has three adult children who are healthy. Patient falls in the dark; had a fall at the beach last November as well. Patient has dizziness when driving on a bumpy rode. Limited in walking distance.   ? Limitations Lifting;Standing;Walking;House hold activities   ? How long can you stand comfortably? has difficulty washing dishes   ? How long can you walk comfortably? to mailbox   ? Patient Stated Goals to be able to walk; to improve balance   ?  Currently in Pain? No/denies   ? ?  ?  ? ?  ? ? ? ? ? ? ? ? ?Treatment:  ? Neuro Re-ed:  ?  ?Airex balance beam:  ?-lateral stepping 6x length of // bars ?-tandem walking 6x length of // bars  ? ? step over orange hurdle 10x each side  ? lateral stepping orange hurdle 10x each side UE support ? ?TherEx:  ? ?Nustep Lvl 4 seat position 6 RPM> 50 for cardiovascular and musculoskeletal challenge ?Sit to stand 10x  ?Modified lunge with UE support 4x length of // bars  ?Standing heel raise 20x  ? lateral squat walks 4x length of // bars  ? ?Seated: ?Adduction ball squeeze with LAQ 10x  ?Adduction ball squeeze 10x 3 second holds ?  ?  ?  ?  ?  ?  ?Pt educated throughout session about proper posture and technique with exercises.  Improved exercise technique, movement at target joints, use of target muscles after min to mod verbal, visual, tactile cues. ?  ? ?Patient presents to physical therapy with excellent motivation. She does require monitoring of oxygen to maintain Sp02 in therapeutic range. Unstable surfaces continue to challenge patient. SP02 is lower on RUE than LUE when measured; is getting an ultrasound down on the 15th. Patient would benefit from additional skilled PT intervention to improve strength, balance and mobility ? ? ? ? ? ? ? ? ? ? ? ? ? ? ? ? ? ? PT Education - 01/04/22 0936   ? ? Education Details exercise technique, body mechanics   ? Person(s) Educated Patient   ? Methods Explanation;Demonstration;Tactile cues;Verbal cues   ? Comprehension Need further instruction;Verbalized understanding;Returned demonstration;Verbal cues required;Tactile cues required   ? ?  ?  ? ?  ? ? ? PT Short Term Goals - 11/30/21 1110   ? ?  ? PT SHORT TERM GOAL #1  ? Title Patient will be independent in home exercise program to improve strength/mobility for better functional independence with ADLs.   ? Baseline 2/28: HEP given, 4/5: doing them 1x a day;   ? Time 4   ? Period Weeks   ? Status Achieved   ? Target Date 11/22/21   ?  ? PT SHORT TERM GOAL #2  ? Title Patient (> 74 years old) will complete five times sit to stand test in < 15 seconds indicating an increased LE strength and improved balance.   ? Baseline 2/28: 18.24 seconds hands on knees, 4/5: 18.6 sec without UE assist;   ? Time 4   ? Period Weeks   ? Status Partially Met   ? Target Date 11/22/21   ? ?  ?  ? ?  ? ? ? ? PT Long Term Goals - 11/30/21 1111   ? ?  ? PT LONG TERM GOAL #1  ? Title Patient will increase FOTO score to equal to or greater than  51%   to demonstrate statistically significant improvement in mobility and quality of life   ? Baseline 2/28; 46%, 4/5: 49%   ? Time 12   ? Period Weeks   ? Status Partially Met   ? Target Date 01/17/22   ?  ? PT LONG TERM GOAL #2   ? Title Patient will increase Berg Balance score by > 6 points (43/56)  to demonstrate decreased fall risk during functional activities.   ? Baseline 2/28: 37/56, 4/5: 44/56   ? Time 12   ? Period Weeks   ?  Status Achieved   ? Target Date 01/17/22   ?  ? PT LONG TERM GOAL #3  ? Title Patient will increase six minute walk test distance to >1300 for progression to community age norm ambulator and improve gait ability   ? Baseline 900 feet on 11/04/21; 4/5: 1005 feet   ? Time 12   ? Period Weeks   ? Status Partially Met   ? Target Date 01/17/22   ?  ? PT LONG TERM GOAL #4  ? Title Patient will increase BLE gross strength to 4+/5 as to improve functional strength for independent gait, increased standing tolerance and increased ADL ability.   ? Baseline 2/28: see note, 4/5: see flowsheet   ? Time 12   ? Period Weeks   ? Status Partially Met   ? Target Date 01/17/22   ? ?  ?  ? ?  ? ? ? ? ? ? ? ? Plan - 01/04/22 0955   ? ? Clinical Impression Statement Patient presents to physical therapy with excellent motivation. She does require monitoring of oxygen to maintain Sp02 in therapeutic range. Unstable surfaces continue to challenge patient. SP02 is lower on RUE than LUE when measured; is getting an ultrasound down on the 15th. Patient would benefit from additional skilled PT intervention to improve strength, balance and mobility   ? Personal Factors and Comorbidities Age;Comorbidity 3+;Fitness;Past/Current Experience;Social Background;Time since onset of injury/illness/exacerbation;Transportation   ? Comorbidities anxiety, arthritis, CKD, colon polyps, COPD, DM, GERD, HOH, Kidney stones, osteoporosis, sleep apnea, venous stasis   ? Examination-Activity Limitations Bathing;Bed Mobility;Bend;Caring for Others;Carry;Locomotion Level;Lift;Hygiene/Grooming;Dressing;Reach Overhead;Sleep;Squat;Transfers;Toileting;Stand;Stairs   ? Examination-Participation Restrictions Church;Cleaning;Community Activity;Driving;Meal  Prep;Laundry;Interpersonal Relationship;Personal Finances;Shop;Volunteer;[place] Work   ? Stability/Clinical Decision Making Evolving/Moderate complexity   ? Rehab Potential Fair   ? PT Frequency 2x / week   ? PT Duration

## 2022-01-05 ENCOUNTER — Telehealth: Payer: Self-pay

## 2022-01-05 DIAGNOSIS — E7800 Pure hypercholesterolemia, unspecified: Secondary | ICD-10-CM

## 2022-01-05 DIAGNOSIS — E876 Hypokalemia: Secondary | ICD-10-CM

## 2022-01-05 MED ORDER — POTASSIUM CHLORIDE CRYS ER 20 MEQ PO TBCR: 40.0000 meq | EXTENDED_RELEASE_TABLET | Freq: Every day | ORAL | 2 refills | Status: DC

## 2022-01-05 NOTE — Telephone Encounter (Signed)
-----   Message from Kate Sable, MD sent at 01/05/2022  8:04 AM EDT ----- ?Potassium still low.  Increase KCl to 40 mEq daily.  Repeat BMP in 1 week. ?

## 2022-01-05 NOTE — Telephone Encounter (Signed)
Called patient and informed her of Dr. Thereasa Solo recommendation. Patient verbalized understanding and agreed with plan. She also mentioned that she has not started taking her Zetia yet as she was afraid she might get the leg cramps she was having with the Atorvastatin. In reviewing her last Lipid panel, it was from a year ago (her LDL was 168). I added a Lipid panel on to her lab draw for next week, and patient stated that she will start taking the Zetia and let us know if she does not tolerate it. ? ? ?

## 2022-01-09 ENCOUNTER — Ambulatory Visit: Payer: Medicare Other

## 2022-01-09 ENCOUNTER — Ambulatory Visit (INDEPENDENT_AMBULATORY_CARE_PROVIDER_SITE_OTHER): Payer: Medicare Other

## 2022-01-09 DIAGNOSIS — M7989 Other specified soft tissue disorders: Secondary | ICD-10-CM | POA: Diagnosis not present

## 2022-01-09 DIAGNOSIS — R269 Unspecified abnormalities of gait and mobility: Secondary | ICD-10-CM

## 2022-01-09 DIAGNOSIS — E7800 Pure hypercholesterolemia, unspecified: Secondary | ICD-10-CM | POA: Diagnosis not present

## 2022-01-09 DIAGNOSIS — R2681 Unsteadiness on feet: Secondary | ICD-10-CM | POA: Diagnosis not present

## 2022-01-09 DIAGNOSIS — M6281 Muscle weakness (generalized): Secondary | ICD-10-CM | POA: Diagnosis not present

## 2022-01-09 DIAGNOSIS — R262 Difficulty in walking, not elsewhere classified: Secondary | ICD-10-CM

## 2022-01-09 DIAGNOSIS — E876 Hypokalemia: Secondary | ICD-10-CM | POA: Diagnosis not present

## 2022-01-09 NOTE — Therapy (Signed)
[facility] ?[place], [place], [address] ?Phone: [phone]   Fax:  [phone] ? ?Physical Therapy Treatment/ Physical Therapy Progress Note ? ? ?Dates of reporting period  11/30/21   to   01/09/22  ? ?Patient Details  ?Name: Amber Holder ?MRN: [id number] ?Date of Birth: [birth year] ?Referring Provider (PT): Drema Dallas PA ? ? ?Encounter Date: 01/09/2022 ? ? PT End of Session - 01/09/22 0956   ? ? Visit Number 20   ? Number of Visits 24   ? Date for PT Re-Evaluation 01/17/22   ? Authorization Type 10/10 eval 2/28   ? PT Start Time 0930   ? PT Stop Time 1014   ? PT Time Calculation (min) 44 min   ? Equipment Utilized During Treatment Gait belt   ? Activity Tolerance Patient tolerated treatment well   ? Behavior During Therapy [facility] for tasks assessed/performed   ? ?  ?  ? ?  ? ? ?Past Medical History:  ?Diagnosis Date  ? Anxiety 01/02/2012  ? Arthritis   ? Asthma   ? Blockage of coronary artery of heart (HCC)   ? Chronic kidney disease   ? Kidney stones  ? Colon polyps   ? COPD (chronic obstructive pulmonary disease) ([place])   ? Diabetes mellitus without complication ([place])   ? GERD (gastroesophageal reflux disease)   ? HOH (hard of hearing)   ? Bilateral hearing aids  ? Kidney stone   ? Osteoporosis   ? Sleep apnea   ? Does not use C-PAP on a regular basis  ? Venous stasis   ? ? ?Past Surgical History:  ?Procedure Laterality Date  ? APPENDECTOMY    ? cataract surgery Bilateral   ? CHOLECYSTECTOMY    ? COLONOSCOPY    ? COLONOSCOPY WITH PROPOFOL N/A 05/28/2015  ? Procedure: COLONOSCOPY WITH PROPOFOL;  Surgeon: Lollie Sails, MD;  Location: [facility] ENDOSCOPY;  Service: Endoscopy;  Laterality: N/A;  ? COLONOSCOPY WITH PROPOFOL N/A 10/28/2018  ? Procedure: COLONOSCOPY WITH PROPOFOL;  Surgeon: Lollie Sails, MD;  Location: [facility] ENDOSCOPY;  Service: Endoscopy;  Laterality: N/A;  ? EYE SURGERY Bilateral 2015  ? Cataract Extraction with IOL  ?  LITHOTRIPSY  2015  ? has had many stones  ? Mastoidotomy  1970  ? ROTATOR CUFF REPAIR Right 2011  ? TOTAL HIP ARTHROPLASTY Right 06/14/2016  ? Procedure: TOTAL HIP ARTHROPLASTY;  Surgeon: Dereck Leep, MD;  Location: [facility] ORS;  Service: Orthopedics;  Laterality: Right;  ? ? ?There were no vitals filed for this visit. ? ? Subjective Assessment - 01/09/22 0957   ? ? Subjective Patient reports she is going to her doctor Friday. Continues to have a hard time with her oxygen.   ? Pertinent History Patient is a 76 year old female who presents for LE weakness and balance deficits. PMH includes anxiety, arthritis, CKD, colon polyps, COPD, DM, GERD, HOH, Kidney stones, osteoporosis, sleep apnea, venous stasis. Lives alone in [place], [place]. She was originally from [place] but more recently from [place]. 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 She has three adult children who are healthy. Patient falls in the dark; had a fall at the beach last November as well. Patient has dizziness when driving on a bumpy rode. Limited in walking distance.   ? Limitations Lifting;Standing;Walking;House hold activities   ? How long can you stand comfortably? has difficulty washing dishes   ? How long can you  walk comfortably? to mailbox   ? Patient Stated Goals to be able to walk; to improve balance   ? Currently in Pain? No/denies   ? ?  ?  ? ?  ? ? ? ? ? ? ?Progress note ?Goals: ?5x STS : 14 seconds ?FOTO 52 ?6 min walk test: 960 ft Sp02 drop to 85% ?BLE strength : 4+/5  ? ?Treatment ? ?Standing with CGA next to support surface:  ?Airex pad: static stand 30 seconds with eyes closed x 2 trials, noticeable trembling of ankles/LE's with fatigue and challenge to maintain stability ?Airex pad: horizontal head turns 30 seconds scanning room 10x ; cueing for arc of motion  ?Airex pad: vertical head turns 30 seconds, cueing for arc of motion, noticeable sway with upward gaze increasing demand on ankle righting reaction  musculature ?Airex pad: one foot on 6" step one foot on airex pad, hold position for 30 seconds, switch legs, 2x each LE; ? ?airex pad 4" step alternating toe taps 10x each LE  ?half foam roller df/pf 15x ?30 seconds SLS with BUE support  ?Patient's condition has the potential to improve in response to therapy. Maximum improvement is yet to be obtained. The anticipated improvement is attainable and reasonable in a generally predictable time.  Patient reports she is getting close to discharge.  ? ? ? ?Patient will be discharged on 01/17/22. The sessions until this point will focus on stabilization and HEP. Patient would benefit from pulmonary therapy as her Sp02 frequently is in the 75s. Frequent rest breaks required to raise Sp02 back to 90s. Patient's condition has the potential to improve in response to therapy. Maximum improvement is yet to be obtained. The anticipated improvement is attainable and reasonable in a generally predictable time.Patient would benefit from additional skilled PT intervention to improve strength, balance and mobility ? ? ? ? ? ? ? ? ? ? ? ? ? ? PT Education - 01/09/22 0956   ? ? Education Details progress note   ? Person(s) Educated Patient   ? Methods Explanation;Tactile cues;Verbal cues;Demonstration   ? Comprehension Verbalized understanding;Returned demonstration;Verbal cues required   ? ?  ?  ? ?  ? ? ? PT Short Term Goals - 01/09/22 0951   ? ?  ? PT SHORT TERM GOAL #1  ? Title Patient will be independent in home exercise program to improve strength/mobility for better functional independence with ADLs.   ? Baseline 2/28: HEP given, 4/5: doing them 1x a day;   ? Time 4   ? Period Weeks   ? Status Achieved   ? Target Date 11/22/21   ?  ? PT SHORT TERM GOAL #2  ? Title Patient (> 77 years old) will complete five times sit to stand test in < 15 seconds indicating an increased LE strength and improved balance.   ? Baseline 2/28: 18.24 seconds hands on knees, 4/5: 18.6 sec without UE  assist; 5/15: 14 seconds   ? Time 4   ? Period Weeks   ? Status Achieved   ? Target Date 11/22/21   ? ?  ?  ? ?  ? ? ? ? PT Long Term Goals - 01/09/22 0950   ? ?  ? PT LONG TERM GOAL #1  ? Title Patient will increase FOTO score to equal to or greater than  51%   to demonstrate statistically significant improvement in mobility and quality of life   ? Baseline 2/28; 46%, 4/5: 49% 5/15: 52%   ?  Time 12   ? Period Weeks   ? Status Achieved   ? Target Date 01/17/22   ?  ? PT LONG TERM GOAL #2  ? Title Patient will increase Berg Balance score by > 6 points (43/56)  to demonstrate decreased fall risk during functional activities.   ? Baseline 2/28: 37/56, 4/5: 44/56   ? Time 12   ? Period Weeks   ? Status Achieved   ? Target Date 01/17/22   ?  ? PT LONG TERM GOAL #3  ? Title Patient will increase six minute walk test distance to >1300 for progression to community age norm ambulator and improve gait ability   ? Baseline 900 feet on 11/04/21; 4/5: 1005 feet 5/15: 960 ft Sp02 drop to 85%   ? Time 12   ? Period Weeks   ? Status Partially Met   ? Target Date 01/17/22   ?  ? PT LONG TERM GOAL #4  ? Title Patient will increase BLE gross strength to 4+/5 as to improve functional strength for independent gait, increased standing tolerance and increased ADL ability.   ? Baseline 2/28: see note, 4/5: see flowsheet 5/15: 4+/5   ? Time 12   ? Period Weeks   ? Status Achieved   ? Target Date 01/17/22   ? ?  ?  ? ?  ? ? ? ? ? ? ? ? Plan - 01/09/22 1000   ? ? Clinical Impression Statement Patient will be discharged on 01/17/22. The sessions until this point will focus on stabilization and HEP. Patient would benefit from pulmonary therapy as her Sp02 frequently is in the 27s. Frequent rest breaks required to raise Sp02 back to 90s. ?Patient's condition has the potential to improve in response to therapy. Maximum improvement is yet to be obtained. The anticipated improvement is attainable and reasonable in a generally predictable time.Patient  would benefit from additional skilled PT intervention to improve strength, balance and mobility   ? Personal Factors and Comorbidities Age;Comorbidity 3+;Fitness;Past/Current Experience;Social Background;Time since onset of in

## 2022-01-10 ENCOUNTER — Ambulatory Visit (INDEPENDENT_AMBULATORY_CARE_PROVIDER_SITE_OTHER): Payer: Medicare Other | Admitting: Internal Medicine

## 2022-01-10 ENCOUNTER — Encounter: Payer: Self-pay | Admitting: Internal Medicine

## 2022-01-10 ENCOUNTER — Telehealth: Payer: Self-pay

## 2022-01-10 VITALS — BP 118/69 | HR 69 | Temp 98.5°F | Resp 16 | Ht 66.0 in | Wt 191.2 lb

## 2022-01-10 DIAGNOSIS — R0602 Shortness of breath: Secondary | ICD-10-CM

## 2022-01-10 DIAGNOSIS — R600 Localized edema: Secondary | ICD-10-CM

## 2022-01-10 DIAGNOSIS — R0902 Hypoxemia: Secondary | ICD-10-CM

## 2022-01-10 DIAGNOSIS — E119 Type 2 diabetes mellitus without complications: Secondary | ICD-10-CM | POA: Diagnosis not present

## 2022-01-10 DIAGNOSIS — Z9981 Dependence on supplemental oxygen: Secondary | ICD-10-CM

## 2022-01-10 DIAGNOSIS — J9611 Chronic respiratory failure with hypoxia: Secondary | ICD-10-CM | POA: Diagnosis not present

## 2022-01-10 LAB — POCT GLYCOSYLATED HEMOGLOBIN (HGB A1C): Hemoglobin A1C: 6.5 % — AB (ref 4.0–5.6)

## 2022-01-10 MED ORDER — TRELEGY ELLIPTA 200-62.5-25 MCG/ACT IN AEPB: INHALATION_SPRAY | RESPIRATORY_TRACT | 2 refills | Status: DC

## 2022-01-10 NOTE — Progress Notes (Signed)
[facility] ?[address] ?[place], [place] [address] ? ?Internal MEDICINE  ?Office Visit Note ? ?Patient Name: Amber Holder ? [id number]  ?[id number] ? ?Date of Service: 01/10/2022 ? ?Chief Complaint  ?Patient presents with  ? Follow-up  ? Diabetes  ? Gastroesophageal Reflux  ? ? ?HPI ? ?Patient is here for routine follow-up. ?1. She has been followed up by cardiology on a regular basis and getting rehab it was noticed that patient is becoming hypoxic with mild to moderate physical activity, she has been on torsemide and Demadex by cardiology along with potassium supplement potassium level has been within normal limits. ?2. Her CT chest did show emphysema and bronchiectasis she was given samples of Trelegy which is helping her ?3. Blood sugar continues to be elevated patient is on metformin her hemoglobin A1c is higher than before ?4. Wellbutrin is working well for smoking cessation ?5.  ABI with arterial Dopplers are normal patient will need to see vascular most likely a component of varicose veins with lymphedema is present patient is wearing compression stockings here in the office ? ? ?Current Medication: ?Outpatient Encounter Medications as of 01/10/2022  ?Medication Sig  ? acetaminophen (TYLENOL) 500 MG chewable tablet Chew 1,000 mg by mouth every 8 (eight) hours as needed for pain.  ? albuterol (VENTOLIN HFA) 108 (90 Base) MCG/ACT inhaler Inhale 2 puffs into the lungs every 6 (six) hours as needed for wheezing or shortness of breath.  ? aspirin EC 81 MG tablet Take 1 tablet (81 mg total) by mouth daily. Swallow whole.  ? buPROPion (WELLBUTRIN XL) 150 MG 24 hr tablet Take 1 tablet (150 mg total) by mouth daily.  ? cholecalciferol (VITAMIN D) 400 UNITS TABS tablet Take 2,000 Units by mouth daily.   ? CRANBERRY PO Take 25,000 mg by mouth daily.  ? ergocalciferol (DRISDOL) 1.25 MG (50000 UT) capsule Take one cap q week  ? ezetimibe (ZETIA) 10 MG tablet Take 1 tablet (10 mg total) by mouth daily.  ?  fluticasone (FLOVENT HFA) 110 MCG/ACT inhaler Inhale 2 puffs into the lungs 2 (two) times daily as needed.  ? glucose blood (ONETOUCH VERIO) test strip BLOOD SUGAR TESTING ONCE DAILY . DX E11.65  ? hydrocortisone cream 0.5 % Apply topically as needed.   ? ibandronate (BONIVA) 150 MG tablet Take 1 tablet (150 mg total) by mouth every 30 (thirty) days.  ? Lancets (ONETOUCH DELICA PLUS JFHLKT62B) MISC Use  as directed twice a daily DX E11.65  ? Magnesium 250 MG TABS Take by mouth. Takes 1 tablet 2-3 times per week  ? metFORMIN (GLUCOPHAGE) 500 MG tablet Take 1 tablet (500 mg total) by mouth 2 (two) times daily with a meal. May take a half tablet.  ? metolazone (ZAROXOLYN) 5 MG tablet Take 0.5 tablets (2.5 mg total) by mouth daily. Take 30 mins prior to your Torsemide.  ? montelukast (SINGULAIR) 10 MG tablet Take 10 mg by mouth daily as needed.  ? Multiple Vitamins-Minerals (MULTIVITAMIN ADULTS PO) Take 1 tablet by mouth daily.  ? omeprazole (PRILOSEC) 40 MG capsule Take 1 capsule (40 mg total) by mouth daily.  ? potassium chloride SA (KLOR-CON M) 20 MEQ tablet Take 2 tablets (40 mEq total) by mouth daily.  ? Simethicone 80 MG TABS Take 1 tablet (80 mg total) by mouth as needed (burping). As directed.  ? SPIRIVA HANDIHALER 18 MCG inhalation capsule PLACE 1 CAPSULE (18 MCG TOTAL) INTO INHALER AND INHALE DAILY. FOR COPD *DO NOT SWALLOW  ?  torsemide (DEMADEX) 20 MG tablet Take 1 tablet (20 mg total) by mouth daily.  ? ?No facility-administered encounter medications on file as of 01/10/2022.  ? ? ?Surgical History: ?Past Surgical History:  ?Procedure Laterality Date  ? APPENDECTOMY    ? cataract surgery Bilateral   ? CHOLECYSTECTOMY    ? COLONOSCOPY    ? COLONOSCOPY WITH PROPOFOL N/A 05/28/2015  ? Procedure: COLONOSCOPY WITH PROPOFOL;  Surgeon: Lollie Sails, MD;  Location: [facility] ENDOSCOPY;  Service: Endoscopy;  Laterality: N/A;  ? COLONOSCOPY WITH PROPOFOL N/A 10/28/2018  ? Procedure: COLONOSCOPY WITH PROPOFOL;  Surgeon:  Lollie Sails, MD;  Location: [facility] ENDOSCOPY;  Service: Endoscopy;  Laterality: N/A;  ? EYE SURGERY Bilateral 2015  ? Cataract Extraction with IOL  ? LITHOTRIPSY  2015  ? has had many stones  ? Mastoidotomy  1970  ? ROTATOR CUFF REPAIR Right 2011  ? TOTAL HIP ARTHROPLASTY Right 06/14/2016  ? Procedure: TOTAL HIP ARTHROPLASTY;  Surgeon: Dereck Leep, MD;  Location: [facility] ORS;  Service: Orthopedics;  Laterality: Right;  ? ? ?Medical History: ?Past Medical History:  ?Diagnosis Date  ? Anxiety 01/02/2012  ? Arthritis   ? Asthma   ? Blockage of coronary artery of heart (HCC)   ? Chronic kidney disease   ? Kidney stones  ? Colon polyps   ? COPD (chronic obstructive pulmonary disease) ([place])   ? Diabetes mellitus without complication ([place])   ? GERD (gastroesophageal reflux disease)   ? HOH (hard of hearing)   ? Bilateral hearing aids  ? Kidney stone   ? Osteoporosis   ? Sleep apnea   ? Does not use C-PAP on a regular basis  ? Venous stasis   ? ? ?Family History: ?Family History  ?Problem Relation Age of Onset  ? Breast cancer Maternal Aunt 90  ? Bladder Cancer Neg Hx   ? Kidney cancer Neg Hx   ? ? ?Social History  ? ?Socioeconomic History  ? Marital status: Divorced  ?  Spouse name: Not on file  ? Number of children: Not on file  ? Years of education: Not on file  ? Highest education level: Not on file  ?Occupational History  ? Not on file  ?Tobacco Use  ? Smoking status: Former  ?  Packs/day: 1.00  ?  Types: Cigarettes  ? Smokeless tobacco: Never  ? Tobacco comments:  ?  1 pack daily//quit 2 weeks ago  ?Vaping Use  ? Vaping Use: Never used  ?Substance and Sexual Activity  ? Alcohol use: Not Currently  ?  Comment: very rarely   ? Drug use: No  ? Sexual activity: Not Currently  ?Other Topics Concern  ? Not on file  ?Social History Narrative  ? Not on file  ? ?Social Determinants of Health  ? ?Financial Resource Strain: Not on file  ?Food Insecurity: Not on file  ?Transportation Needs: Not on file  ?Physical Activity:  Not on file  ?Stress: Not on file  ?Social Connections: Not on file  ?Intimate Partner Violence: Not on file  ? ? ? ? ?Review of Systems  ?Constitutional:  Negative for fatigue and fever.  ?HENT:  Negative for congestion, mouth sores and postnasal drip.   ?Respiratory:  Positive for shortness of breath. Negative for cough.   ?Cardiovascular:  Positive for leg swelling. Negative for chest pain.  ?Genitourinary:  Negative for flank pain.  ?Musculoskeletal:  Positive for back pain.  ?Neurological:  Negative for dizziness.  ?Psychiatric/Behavioral: Negative.    ? ?  Vital Signs: ?BP 118/69   Pulse 69   Temp 98.5 ?F (36.9 ?C)   Resp 16   Ht '5\' 6"'$  (1.676 m)   Wt 191 lb 3.2 oz (86.7 kg)   SpO2 92%   BMI 30.86 kg/m?  ? ? ?Physical Exam ?Constitutional:   ?   Appearance: Normal appearance.  ?HENT:  ?   Head: Normocephalic and atraumatic.  ?   Nose: Nose normal.  ?   Mouth/Throat:  ?   Mouth: Mucous membranes are moist.  ?   Pharynx: No posterior oropharyngeal erythema.  ?Eyes:  ?   Extraocular Movements: Extraocular movements intact.  ?   Pupils: Pupils are equal, round, and reactive to light.  ?Cardiovascular:  ?   Rate and Rhythm: Normal rate and regular rhythm.  ?   Pulses: Normal pulses.  ?   Heart sounds: Normal heart sounds.  ?Pulmonary:  ?   Effort: Pulmonary effort is normal.  ?   Breath sounds: Normal breath sounds.  ?Skin: ?   General: Skin is warm.  ?   Findings: Erythema present.  ?Neurological:  ?   General: No focal deficit present.  ?   Mental Status: She is alert.  ?Psychiatric:     ?   Mood and Affect: Mood normal.     ?   Behavior: Behavior normal.  ? ? ? ? ? ?Assessment/Plan: ?1. Type 2 diabetes mellitus without complication, without long-term current use of insulin ([place]) ?Elevated hemoglobin A1c will DC metformin and give her samples of Rybelsus today ?- POCT HgB A1C ? ?2. SOB (shortness of breath) ?Patient continues to have shortness of breath during the physical therapy we will do a 6-minute  walk ?- 6 minute walk; Future ? ?3.  Hypoxemia requiring supplemental oxygen ?6-minute walk is abnormal with O2 desaturations of 87% patient will need to be on oxygen 2 L nasal cannula ?- For home use only DME oxygen ? ?

## 2022-01-10 NOTE — Telephone Encounter (Signed)
Spoke with kelly and send message to Judson Roch that we need oxygen deliver today asap  ?

## 2022-01-11 ENCOUNTER — Ambulatory Visit: Payer: Medicare Other

## 2022-01-11 DIAGNOSIS — M6281 Muscle weakness (generalized): Secondary | ICD-10-CM

## 2022-01-11 DIAGNOSIS — R262 Difficulty in walking, not elsewhere classified: Secondary | ICD-10-CM | POA: Diagnosis not present

## 2022-01-11 DIAGNOSIS — E7800 Pure hypercholesterolemia, unspecified: Secondary | ICD-10-CM | POA: Diagnosis not present

## 2022-01-11 DIAGNOSIS — R269 Unspecified abnormalities of gait and mobility: Secondary | ICD-10-CM

## 2022-01-11 DIAGNOSIS — R2681 Unsteadiness on feet: Secondary | ICD-10-CM

## 2022-01-11 DIAGNOSIS — E876 Hypokalemia: Secondary | ICD-10-CM | POA: Diagnosis not present

## 2022-01-11 NOTE — Therapy (Signed)
[facility] ?[place], [place], [address] ?Phone: [phone]   Fax:  [phone] ? ?Physical Therapy Treatment ? ?Patient Details  ?Name: Amber Holder ?MRN: [id number] ?Date of Birth: Nov 16, 1945 ?Referring Provider (PT): Drema Dallas PA ? ? ?Encounter Date: 01/11/2022 ? ? PT End of Session - 01/11/22 0941   ? ? Visit Number 21   ? Number of Visits 24   ? Date for PT Re-Evaluation 01/17/22   ? Authorization Type Medicare Part A and B   ? Authorization Time Period 10/25/21-01/17/22   ? Progress Note Due on Visit 30   ? PT Start Time 8309   ? PT Stop Time 1013   ? PT Time Calculation (min) 38 min   ? Equipment Utilized During Treatment Gait belt   ? Activity Tolerance Patient tolerated treatment well;No increased pain   ? Behavior During Therapy [facility] for tasks assessed/performed   ? ?  ?  ? ?  ? ? ?Past Medical History:  ?Diagnosis Date  ? Anxiety 01/02/2012  ? Arthritis   ? Asthma   ? Blockage of coronary artery of heart (HCC)   ? Chronic kidney disease   ? Kidney stones  ? Colon polyps   ? COPD (chronic obstructive pulmonary disease) ([place])   ? Diabetes mellitus without complication ([place])   ? GERD (gastroesophageal reflux disease)   ? HOH (hard of hearing)   ? Bilateral hearing aids  ? Kidney stone   ? Osteoporosis   ? Sleep apnea   ? Does not use C-PAP on a regular basis  ? Venous stasis   ? ? ?Past Surgical History:  ?Procedure Laterality Date  ? APPENDECTOMY    ? cataract surgery Bilateral   ? CHOLECYSTECTOMY    ? COLONOSCOPY    ? COLONOSCOPY WITH PROPOFOL N/A 05/28/2015  ? Procedure: COLONOSCOPY WITH PROPOFOL;  Surgeon: Lollie Sails, MD;  Location: [facility] ENDOSCOPY;  Service: Endoscopy;  Laterality: N/A;  ? COLONOSCOPY WITH PROPOFOL N/A 10/28/2018  ? Procedure: COLONOSCOPY WITH PROPOFOL;  Surgeon: Lollie Sails, MD;  Location: [facility] ENDOSCOPY;  Service: Endoscopy;  Laterality: N/A;  ? EYE SURGERY Bilateral 2015  ? Cataract Extraction with  IOL  ? LITHOTRIPSY  2015  ? has had many stones  ? Mastoidotomy  1970  ? ROTATOR CUFF REPAIR Right 2011  ? TOTAL HIP ARTHROPLASTY Right 06/14/2016  ? Procedure: TOTAL HIP ARTHROPLASTY;  Surgeon: Dereck Leep, MD;  Location: [facility] ORS;  Service: Orthopedics;  Laterality: Right;  ? ? ?There were no vitals filed for this visit. ? ? Subjective Assessment - 01/11/22 0939   ? ? Subjective Pt now has her oxygen apparatus new since yesterday. No pain today.   ? Pertinent History Patient is a 76 year old female who presents for LE weakness and balance deficits. PMH includes anxiety, arthritis, CKD, colon polyps, COPD, DM, GERD, HOH, Kidney stones, osteoporosis, sleep apnea, venous stasis. Lives alone in [place], [place]. She was originally from [place] but more recently from [place]. 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 She has three adult children who are healthy. Patient falls in the dark; had a fall at the beach last November as well. Patient has dizziness when driving on a bumpy rode. Limited in walking distance.   ? Currently in Pain? No/denies   ? ?  ?  ? ?  ? ? ?INTERVENTION THIS DATE: ?Normal stance foam x60sec ?Normal stance foam inclined surface 60sec (  more sway) ?-narrow stance firm surface 1x60, c ball toss x 60sec  ?-lateral steppin gin bars + step up 3 RT bilat, hands ad lib. ?-cable resisted AMB retro x2, lateral x1 bilat , firm and mat surfaces (12.5lb) Left hip weaker  ?-2*4 balance beam wlaking in // bars 3 RT  ?-hedgehog walking 3RT in bars unilat  ? ? ? ? PT Education - 01/11/22 0948   ? ? Education Details use of O2   ? Person(s) Educated Patient   ? Methods Explanation   ? Comprehension Verbalized understanding;Need further instruction   ? ?  ?  ? ?  ? ? ? PT Short Term Goals - 01/09/22 0951   ? ?  ? PT SHORT TERM GOAL #1  ? Title Patient will be independent in home exercise program to improve strength/mobility for better functional independence with ADLs.   ? Baseline 2/28: HEP given,  4/5: doing them 1x a day;   ? Time 4   ? Period Weeks   ? Status Achieved   ? Target Date 11/22/21   ?  ? PT SHORT TERM GOAL #2  ? Title Patient (> 64 years old) will complete five times sit to stand test in < 15 seconds indicating an increased LE strength and improved balance.   ? Baseline 2/28: 18.24 seconds hands on knees, 4/5: 18.6 sec without UE assist; 5/15: 14 seconds   ? Time 4   ? Period Weeks   ? Status Achieved   ? Target Date 11/22/21   ? ?  ?  ? ?  ? ? ? ? PT Long Term Goals - 01/09/22 0950   ? ?  ? PT LONG TERM GOAL #1  ? Title Patient will increase FOTO score to equal to or greater than  51%   to demonstrate statistically significant improvement in mobility and quality of life   ? Baseline 2/28; 46%, 4/5: 49% 5/15: 52%   ? Time 12   ? Period Weeks   ? Status Achieved   ? Target Date 01/17/22   ?  ? PT LONG TERM GOAL #2  ? Title Patient will increase Berg Balance score by > 6 points (43/56)  to demonstrate decreased fall risk during functional activities.   ? Baseline 2/28: 37/56, 4/5: 44/56   ? Time 12   ? Period Weeks   ? Status Achieved   ? Target Date 01/17/22   ?  ? PT LONG TERM GOAL #3  ? Title Patient will increase six minute walk test distance to >1300 for progression to community age norm ambulator and improve gait ability   ? Baseline 900 feet on 11/04/21; 4/5: 1005 feet 5/15: 960 ft Sp02 drop to 85%   ? Time 12   ? Period Weeks   ? Status Partially Met   ? Target Date 01/17/22   ?  ? PT LONG TERM GOAL #4  ? Title Patient will increase BLE gross strength to 4+/5 as to improve functional strength for independent gait, increased standing tolerance and increased ADL ability.   ? Baseline 2/28: see note, 4/5: see flowsheet 5/15: 4+/5   ? Time 12   ? Period Weeks   ? Status Achieved   ? Target Date 01/17/22   ? ?  ?  ? ?  ? ? ? ? ? ? ? ? Plan - 01/11/22 0952   ? ? Clinical Impression Statement Continued to work balance. Good tolerance Hard worker. O2 monitored on 2L.  Breaks as needed. Mouth  breathing but knows. Continue toward goals.   ? Personal Factors and Comorbidities Age;Comorbidity 3+;Fitness;Past/Current Experience;Social Background;Time since onset of injury/illness/exacerbation;Transportation   ? Comorbidities anxiety, arthritis, CKD, colon polyps, COPD, DM, GERD, HOH, Kidney stones, osteoporosis, sleep apnea, venous stasis   ? Examination-Activity Limitations Bathing;Bed Mobility;Bend;Caring for Others;Carry;Locomotion Level;Lift;Hygiene/Grooming;Dressing;Reach Overhead;Sleep;Squat;Transfers;Toileting;Stand;Stairs   ? Examination-Participation Restrictions Church;Cleaning;Community Activity;Driving;Meal Prep;Laundry;Interpersonal Relationship;Personal Finances;Shop;Volunteer;[place] Work   ? Stability/Clinical Decision Making Evolving/Moderate complexity   ? Clinical Decision Making Moderate   ? Rehab Potential Fair   ? PT Frequency 2x / week   ? PT Duration 12 weeks   ? PT Treatment/Interventions ADLs/Self Care Home Management;Cryotherapy;Electrical Stimulation;Canalith Repostioning;Moist Heat;Iontophoresis 12m/ml Dexamethasone;Traction;DME Instruction;Gait training;Stair training;Functional mobility training;Therapeutic activities;Therapeutic exercise;Neuromuscular re-education;Balance training;Patient/family education;Manual techniques;Passive range of motion;Dry needling;Vestibular;Taping;Splinting;Energy conservation;Visual/perceptual remediation/compensation   ? PT Home Exercise Plan 11/01/2021=Access Code: [email or web address]  URL: [email or web address]   ? Consulted and Agree with Plan of Care Patient   ? ?  ?  ? ?  ? ? ?Patient will benefit from skilled therapeutic intervention in order to improve the following deficits and impairments:  Abnormal gait, Cardiopulmonary status limiting activity, Decreased activity tolerance, Decreased balance, Decreased endurance, Decreased knowledge of use of DME, Decreased safety awareness, Decreased mobility, Decreased strength, Difficulty walking,  Impaired flexibility, Impaired perceived functional ability, Impaired sensation, Postural dysfunction, Improper body mechanics, Pain ? ?Visit Diagnosis: ?Abnormality of gait and mobility ? ?Difficulty in walkin

## 2022-01-16 ENCOUNTER — Ambulatory Visit: Payer: Medicare Other

## 2022-01-16 ENCOUNTER — Other Ambulatory Visit
Admission: RE | Admit: 2022-01-16 | Discharge: 2022-01-16 | Disposition: A | Discharge status: Home / Self Care | Payer: Medicare Other | Source: Home / Self Care | Attending: Cardiology | Admitting: Cardiology

## 2022-01-16 DIAGNOSIS — R2689 Other abnormalities of gait and mobility: Secondary | ICD-10-CM

## 2022-01-16 DIAGNOSIS — M6281 Muscle weakness (generalized): Secondary | ICD-10-CM | POA: Diagnosis not present

## 2022-01-16 DIAGNOSIS — E7800 Pure hypercholesterolemia, unspecified: Secondary | ICD-10-CM | POA: Insufficient documentation

## 2022-01-16 DIAGNOSIS — R262 Difficulty in walking, not elsewhere classified: Secondary | ICD-10-CM | POA: Diagnosis not present

## 2022-01-16 DIAGNOSIS — E876 Hypokalemia: Secondary | ICD-10-CM | POA: Insufficient documentation

## 2022-01-16 DIAGNOSIS — R269 Unspecified abnormalities of gait and mobility: Secondary | ICD-10-CM

## 2022-01-16 DIAGNOSIS — R2681 Unsteadiness on feet: Secondary | ICD-10-CM

## 2022-01-16 LAB — LIPID PANEL
Cholesterol: 261 mg/dL — ABNORMAL HIGH (ref 0–200)
HDL: 52 mg/dL (ref >40)
LDL Cholesterol: 182 mg/dL — ABNORMAL HIGH (ref 0–99)
Total CHOL/HDL Ratio: 5 RATIO
Triglycerides: 134 mg/dL (ref <150)
VLDL: 27 mg/dL (ref 0–40)

## 2022-01-16 LAB — BASIC METABOLIC PANEL
Anion gap: 11 (ref 5–15)
BUN: 25 mg/dL — ABNORMAL HIGH (ref 8–23)
CO2: 27 mmol/L (ref 22–32)
Calcium: 9.9 mg/dL (ref 8.9–10.3)
Chloride: 97 mmol/L — ABNORMAL LOW (ref 98–111)
Creatinine, Ser: 0.9 mg/dL (ref 0.44–1.00)
GFR, Estimated: >60 mL/min (ref >60)
Glucose, Bld: 133 mg/dL — ABNORMAL HIGH (ref 70–99)
Potassium: 3.5 mmol/L (ref 3.5–5.1)
Sodium: 135 mmol/L (ref 135–145)

## 2022-01-16 NOTE — Therapy (Signed)
[place] MAIN [facility] SERVICES [address] [place], [place], [address] Phone: [phone]   Fax:  [phone]  Physical Therapy Treatment/Discharge   Patient Details  Name: Amber Holder MRN: [id number] Date of Birth: 1946/08/10 Referring Provider (PT): Drema Dallas PA   Encounter Date: 01/16/2022   PT End of Session - 01/16/22 0851     Visit Number 22    Number of Visits 24    Date for PT Re-Evaluation 01/17/22    Authorization Type Medicare Part A and B    Authorization Time Period 10/25/21-01/17/22    Progress Note Due on Visit 30    PT Start Time 0846    PT Stop Time 0926    PT Time Calculation (min) 40 min    Activity Tolerance Patient tolerated treatment well;No increased pain    Behavior During Therapy [facility] for tasks assessed/performed             Past Medical History:  Diagnosis Date   Anxiety 01/02/2012   Arthritis    Asthma    Blockage of coronary artery of heart (HCC)    Chronic kidney disease    Kidney stones   Colon polyps    COPD (chronic obstructive pulmonary disease) (HCC)    Diabetes mellitus without complication (HCC)    GERD (gastroesophageal reflux disease)    HOH (hard of hearing)    Bilateral hearing aids   Kidney stone    Osteoporosis    Sleep apnea    Does not use C-PAP on a regular basis   Venous stasis     Past Surgical History:  Procedure Laterality Date   APPENDECTOMY     cataract surgery Bilateral    CHOLECYSTECTOMY     COLONOSCOPY     COLONOSCOPY WITH PROPOFOL N/A 05/28/2015   Procedure: COLONOSCOPY WITH PROPOFOL;  Surgeon: Lollie Sails, MD;  Location: [facility] ENDOSCOPY;  Service: Endoscopy;  Laterality: N/A;   COLONOSCOPY WITH PROPOFOL N/A 10/28/2018   Procedure: COLONOSCOPY WITH PROPOFOL;  Surgeon: Lollie Sails, MD;  Location: [facility] ENDOSCOPY;  Service: Endoscopy;  Laterality: N/A;   EYE SURGERY Bilateral 2015   Cataract Extraction with IOL   LITHOTRIPSY  2015   has had  many stones   Mastoidotomy  1970   ROTATOR CUFF REPAIR Right 2011   TOTAL HIP ARTHROPLASTY Right 06/14/2016   Procedure: TOTAL HIP ARTHROPLASTY;  Surgeon: Dereck Leep, MD;  Location: [facility] ORS;  Service: Orthopedics;  Laterality: Right;    There were no vitals filed for this visit.   Subjective Assessment - 01/16/22 0850     Subjective Pt saw frozen over weekend. Her new O2 concentrator continues to make her feel better iwht daily mobility.    Pertinent History Patient is a 76 year old female who presents for LE weakness and balance deficits. PMH includes anxiety, arthritis, CKD, colon polyps, COPD, DM, GERD, HOH, Kidney stones, osteoporosis, sleep apnea, venous stasis. Lives alone in [place], [place]. She was originally from [place] but more recently from [place]. 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 She has three adult children who are healthy. Patient falls in the dark; had a fall at the beach last November as well. Patient has dizziness when driving on a bumpy rode. Limited in walking distance.    Currently in Pain? No/denies            Intervention and HEP review Normal stance airex with head turns, then narrow stance, then stagger stance  with forward gaze Marching in place hands free or PRN Red band hip ABDCT, hip EXT Standing bilat heel raises  SLS x30sec c BUE support  HEP handout updated to reflect discharge updates     PT Education - 01/16/22 0852     Education Details safe balacne HEP setup    Person(s) Educated Patient    Methods Explanation    Comprehension Verbalized understanding              PT Short Term Goals - 01/09/22 0951       PT SHORT TERM GOAL #1   Title Patient will be independent in home exercise program to improve strength/mobility for better functional independence with ADLs.    Baseline 2/28: HEP given, 4/5: doing them 1x a day;    Time 4    Period Weeks    Status Achieved    Target Date 11/22/21      PT SHORT TERM  GOAL #2   Title Patient (> 25 years old) will complete five times sit to stand test in < 15 seconds indicating an increased LE strength and improved balance.    Baseline 2/28: 18.24 seconds hands on knees, 4/5: 18.6 sec without UE assist; 5/15: 14 seconds    Time 4    Period Weeks    Status Achieved    Target Date 11/22/21               PT Long Term Goals - 01/09/22 0950       PT LONG TERM GOAL #1   Title Patient will increase FOTO score to equal to or greater than  51%   to demonstrate statistically significant improvement in mobility and quality of life    Baseline 2/28; 46%, 4/5: 49% 5/15: 52%    Time 12    Period Weeks    Status Achieved    Target Date 01/17/22      PT LONG TERM GOAL #2   Title Patient will increase Berg Balance score by > 6 points (43/56)  to demonstrate decreased fall risk during functional activities.    Baseline 2/28: 37/56, 4/5: 44/56    Time 12    Period Weeks    Status Achieved    Target Date 01/17/22      PT LONG TERM GOAL #3   Title Patient will increase six minute walk test distance to >1300 for progression to community age norm ambulator and improve gait ability    Baseline 900 feet on 11/04/21; 4/5: 1005 feet 5/15: 960 ft Sp02 drop to 85%    Time 12    Period Weeks    Status Partially Met    Target Date 01/17/22      PT LONG TERM GOAL #4   Title Patient will increase BLE gross strength to 4+/5 as to improve functional strength for independent gait, increased standing tolerance and increased ADL ability.    Baseline 2/28: see note, 4/5: see flowsheet 5/15: 4+/5    Time 12    Period Weeks    Status Achieved    Target Date 01/17/22                   Plan - 01/16/22 0853     Clinical Impression Statement Per plan from prior visit reassessment pt agreeable to DC today. Pt has made excellent progress toward goals of therapy. HEP is updated to reflect current challenge levels. Pt is agreeable to stay current with her exercises  over the  next 12 weeks to further he progress in hip strength and balance.    Personal Factors and Comorbidities Age;Comorbidity 3+;Fitness;Past/Current Experience;Social Background;Time since onset of injury/illness/exacerbation;Transportation    Comorbidities anxiety, arthritis, CKD, colon polyps, COPD, DM, GERD, HOH, Kidney stones, osteoporosis, sleep apnea, venous stasis    Examination-Activity Limitations Bathing;Bed Mobility;Bend;Caring for Others;Carry;Locomotion Level;Lift;Hygiene/Grooming;Dressing;Reach Overhead;Sleep;Squat;Transfers;Toileting;Stand;Stairs    Examination-Participation Restrictions Church;Cleaning;Community Activity;Driving;Meal Prep;Laundry;Interpersonal Relationship;Personal Finances;Shop;Volunteer;Yard Work    Merchant navy officer Evolving/Moderate complexity    Clinical Decision Making Moderate    Rehab Potential Fair    PT Frequency 2x / week    PT Duration 12 weeks    PT Treatment/Interventions ADLs/Self Care Home Management;Cryotherapy;Electrical Stimulation;Canalith Repostioning;Moist Heat;Iontophoresis 79m/ml Dexamethasone;Traction;DME Instruction;Gait training;Stair training;Functional mobility training;Therapeutic activities;Therapeutic exercise;Neuromuscular re-education;Balance training;Patient/family education;Manual techniques;Passive range of motion;Dry needling;Vestibular;Taping;Splinting;Energy conservation;Visual/perceptual remediation/compensation    PT Home Exercise Plan 11/01/2021=Access Code: [email or web address]  URL: [email or web address]    Consulted and Agree with Plan of Care Patient             Patient will benefit from skilled therapeutic intervention in order to improve the following deficits and impairments:  Abnormal gait, Cardiopulmonary status limiting activity, Decreased activity tolerance, Decreased balance, Decreased endurance, Decreased knowledge of use of DME, Decreased safety awareness, Decreased mobility, Decreased  strength, Difficulty walking, Impaired flexibility, Impaired perceived functional ability, Impaired sensation, Postural dysfunction, Improper body mechanics, Pain  Visit Diagnosis: Abnormality of gait and mobility  Difficulty in walking, not elsewhere classified  Muscle weakness (generalized)  Unsteadiness on feet  Other abnormalities of gait and mobility     Problem List Patient Active Problem List   Diagnosis Date Noted   Hypokalemia 09/12/2020   Chronic venous stasis 01/11/2020   Atopic dermatitis 024/04/7352  Diastolic dysfunction 029/92/4268  Peripheral edema 09/10/2019   Dyspnea on exertion 09/10/2019   Muscle cramps 09/10/2019   Type 2 diabetes mellitus with hyperglycemia ([place] 05/18/2019   Seasonal allergic rhinitis 05/18/2019   Screening for breast cancer 05/18/2019   Stenosis of left carotid artery 02/05/2019   Acute upper respiratory infection 10/09/2018   Flu-like symptoms 10/09/2018   Sore throat 10/09/2018   Vitamin D deficiency 07/27/2018   Need for vaccination against Streptococcus pneumoniae using pneumococcal conjugate vaccine 7 07/27/2018   Encounter for general adult medical examination with abnormal findings 04/07/2018   Primary generalized (osteo)arthritis 04/07/2018   Hematuria, microscopic 034/19/6222  Renal colic 097/98/9211  Urinary tract infection without hematuria 11/21/2017   Dysuria 11/21/2017   Uncontrolled type 2 diabetes mellitus with hyperglycemia ([place] 11/21/2017   Diabetes mellitus type 2, uncomplicated ([place] 094/17/4081  Osteoporosis 10/04/2017   S/P total hip arthroplasty 06/14/2016   Inguinal hernia, left 144/81/8563  Umbilical hernia 114/97/0263  Increased frequency of urination 11/27/2012   Chronic cystitis 11/19/2012   Incomplete emptying of bladder 11/19/2012   Medullary sponge kidney 11/19/2012   Mixed urge and stress incontinence 11/19/2012   Anxiety 01/02/2012   Calculus of kidney 01/02/2012   Chronic obstructive  pulmonary disease ([place] 01/02/2012   GERD (gastroesophageal reflux disease) 01/02/2012   Hearing loss 01/02/2012   Mixed hyperlipidemia 01/02/2012   Obesity, unspecified 01/02/2012   OSA on CPAP 01/02/2012   Tobacco abuse 01/02/2012   Venous insufficiency 01/02/2012   9:32 AM, 01/16/22 AEtta Grandchild PT, DPT Physical Therapist - [facility] [id number]    [place]C, PT 01/16/2022, 9:20 AM  [facility] [address]  [place], [place], [address] Phone: [phone]   Fax:  [phone]  Name: Daniella Dewberry MRN: [id number] Date of Birth: March 20, 1946

## 2022-01-18 ENCOUNTER — Ambulatory Visit: Payer: MEDICARE

## 2022-01-18 DIAGNOSIS — H903 Sensorineural hearing loss, bilateral: Secondary | ICD-10-CM | POA: Diagnosis not present

## 2022-01-18 DIAGNOSIS — H6122 Impacted cerumen, left ear: Secondary | ICD-10-CM | POA: Diagnosis not present

## 2022-01-18 DIAGNOSIS — H6691 Otitis media, unspecified, right ear: Secondary | ICD-10-CM | POA: Diagnosis not present

## 2022-01-19 ENCOUNTER — Ambulatory Visit (INDEPENDENT_AMBULATORY_CARE_PROVIDER_SITE_OTHER): Payer: Medicare Other | Admitting: Nurse Practitioner

## 2022-01-19 ENCOUNTER — Encounter (INDEPENDENT_AMBULATORY_CARE_PROVIDER_SITE_OTHER): Payer: Self-pay | Admitting: Nurse Practitioner

## 2022-01-19 ENCOUNTER — Other Ambulatory Visit: Payer: Self-pay | Admitting: Internal Medicine

## 2022-01-19 VITALS — BP 123/72 | HR 79 | Resp 17 | Ht 66.0 in | Wt 185.0 lb

## 2022-01-19 DIAGNOSIS — I6522 Occlusion and stenosis of left carotid artery: Secondary | ICD-10-CM | POA: Diagnosis not present

## 2022-01-19 DIAGNOSIS — E1165 Type 2 diabetes mellitus with hyperglycemia: Secondary | ICD-10-CM

## 2022-01-19 DIAGNOSIS — F17210 Nicotine dependence, cigarettes, uncomplicated: Secondary | ICD-10-CM

## 2022-01-19 DIAGNOSIS — R609 Edema, unspecified: Secondary | ICD-10-CM

## 2022-01-20 ENCOUNTER — Telehealth: Payer: Self-pay

## 2022-01-20 NOTE — Telephone Encounter (Signed)
Oxygen therapy order signed by provider and placed in AHP folder. 

## 2022-01-25 ENCOUNTER — Ambulatory Visit: Payer: MEDICARE

## 2022-01-29 ENCOUNTER — Encounter (INDEPENDENT_AMBULATORY_CARE_PROVIDER_SITE_OTHER): Payer: Self-pay | Admitting: Nurse Practitioner

## 2022-01-29 NOTE — Progress Notes (Signed)
Subjective:    Patient ID: Amber Holder, female    DOB: 08/03/1946, 76 y.o.   MRN: [id number] Chief Complaint  Patient presents with   Establish Care    Referred by dr Garen Lah     Amber Holder is a 76 year old female that presents today for evaluation of bilateral leg pain as a referral from her cardiologist Dr. Garen Lah.  The patient is a current smoker with a past history of venous insufficiency, PAD and carotid stenosis.  She notes that recently she has been having issues with walking and notes weakness in her thighs after walking a short distance.  The patient previously wore compression socks on a daily basis however recently has not been wearing them as frequently due to them being tighter and more difficult to wear.  She denies any TIA-like symptoms or amaurosis fugax.  Is been a few years since her carotids have been checked.  It was noted that there was no significant plaque on the right with less than 50% on the left.  The patient also notes having swelling in her lower extremities as well.  Since her last visit on 11/17/2021 she had bilateral lower extremity arterial duplexes which showed no evidence of significant stenosis bilaterally.  She had triphasic waveforms throughout her bilateral lower extremities.   Review of Systems  Cardiovascular:  Positive for leg swelling.  Musculoskeletal:  Positive for gait problem.  All other systems reviewed and are negative.     Objective:   Physical Exam Vitals reviewed.  HENT:     Head: Normocephalic.  Cardiovascular:     Rate and Rhythm: Normal rate.  Pulmonary:     Effort: Pulmonary effort is normal.  Musculoskeletal:     Right lower leg: Edema present.     Left lower leg: Edema present.  Skin:    General: Skin is warm and dry.  Neurological:     Mental Status: She is alert and oriented to person, place, and time.  Psychiatric:        Mood and Affect: Mood normal.        Behavior: Behavior normal.        Thought  Content: Thought content normal.        Judgment: Judgment normal.    BP 123/72 (BP Location: Left Arm)   Pulse 79   Resp 17   Ht '5\' 6"'$  (1.676 m)   Wt 185 lb (83.9 kg)   BMI 29.86 kg/m   Past Medical History:  Diagnosis Date   Anxiety 01/02/2012   Arthritis    Asthma    Blockage of coronary artery of heart (HCC)    Chronic kidney disease    Kidney stones   Colon polyps    COPD (chronic obstructive pulmonary disease) (HCC)    Diabetes mellitus without complication (HCC)    GERD (gastroesophageal reflux disease)    HOH (hard of hearing)    Bilateral hearing aids   Kidney stone    Osteoporosis    Sleep apnea    Does not use C-PAP on a regular basis   Venous stasis     Social History   Socioeconomic History   Marital status: Divorced    Spouse name: Not on file   Number of children: Not on file   Years of education: Not on file   Highest education level: Not on file  Occupational History   Not on file  Tobacco Use   Smoking status: Former    Packs/day: 1.00  Types: Cigarettes   Smokeless tobacco: Never   Tobacco comments:    1 pack daily//quit 2 weeks ago  Vaping Use   Vaping Use: Never used  Substance and Sexual Activity   Alcohol use: Not Currently    Comment: very rarely    Drug use: No   Sexual activity: Not Currently  Other Topics Concern   Not on file  Social History Narrative   Not on file   Social Determinants of Health   Financial Resource Strain: Not on file  Food Insecurity: Not on file  Transportation Needs: Not on file  Physical Activity: Not on file  Stress: Not on file  Social Connections: Not on file  Intimate Partner Violence: Not on file    Past Surgical History:  Procedure Laterality Date   APPENDECTOMY     cataract surgery Bilateral    CHOLECYSTECTOMY     COLONOSCOPY     COLONOSCOPY WITH PROPOFOL N/A 05/28/2015   Procedure: COLONOSCOPY WITH PROPOFOL;  Surgeon: Lollie Sails, MD;  [facility] ENDOSCOPY;  Service:  Endoscopy;  Laterality: N/A;   COLONOSCOPY WITH PROPOFOL N/A 10/28/2018   Procedure: COLONOSCOPY WITH PROPOFOL;  Surgeon: Lollie Sails, MD;  Location: [facility] ENDOSCOPY;  Service: Endoscopy;  Laterality: N/A;   EYE SURGERY Bilateral 2015   Cataract Extraction with IOL   LITHOTRIPSY  2015   has had many stones   Mastoidotomy  1970   ROTATOR CUFF REPAIR Right 2011   TOTAL HIP ARTHROPLASTY Right 06/14/2016   Procedure: TOTAL HIP ARTHROPLASTY;  Surgeon: Dereck Leep, MD;  Location: [facility] ORS;  Service: Orthopedics;  Laterality: Right;    Family History  Problem Relation Age of Onset   Breast cancer Maternal Aunt 50   Bladder Cancer Neg Hx    Kidney cancer Neg Hx     Allergies  Allergen Reactions   Iodinated Contrast Media Hives and Itching    Pt received 13hr pre-meds for a Cardiac scan on 11/16/21 and had itching and Hives develop after contrast administration   Shellfish Allergy Swelling    "shrimp" Betadine okay   Sulfa Antibiotics Hives    Difficulty breathing   Sulfasalazine Hives    Difficulty breathing   Percocet [Oxycodone-Acetaminophen] Hives    Difficulty breathing   Iodine Itching       Latest Ref Rng & Units 12/24/2020   11:54 AM 09/11/2019   10:35 AM 07/09/2018   10:04 AM  CBC  WBC 3.4 - 10.8 x10E3/uL 8.5   8.0   6.9    Hemoglobin 11.1 - 15.9 g/dL 16.1   15.0   14.4    Hematocrit 34.0 - 46.6 % 46.9   44.4   43.2    Platelets 150 - 450 x10E3/uL 238   240   244        CMP     Component Value Date/Time   NA 135 01/16/2022 0952   NA 140 12/13/2021 1004   NA 138 07/16/2013 0521   K 3.5 01/16/2022 0952   K 3.9 07/16/2013 0521   CL 97 (L) 01/16/2022 0952   CL 108 (H) 07/16/2013 0521   CO2 27 01/16/2022 0952   CO2 25 07/16/2013 0521   GLUCOSE 133 (H) 01/16/2022 0952   GLUCOSE 102 (H) 07/16/2013 0521   BUN 25 (H) 01/16/2022 0952   BUN 28 (H) 12/13/2021 1004   BUN 13 07/16/2013 0521   CREATININE 0.90 01/16/2022 0952   CREATININE 0.71 07/16/2013 0521    CALCIUM  9.9 01/16/2022 0952   CALCIUM 7.8 (L) 07/16/2013 0521   PROT 6.7 06/13/2021 1406   ALBUMIN 4.4 06/13/2021 1406   AST 18 06/13/2021 1406   ALT 16 06/13/2021 1406   ALKPHOS 69 06/13/2021 1406   BILITOT 0.5 06/13/2021 1406   GFRNONAA >60 01/16/2022 0952   GFRNONAA >60 07/16/2013 0521   GFRAA 104 09/11/2019 1035   GFRAA >60 07/16/2013 0521     No results found.     Assessment & Plan:   1. Peripheral edema The patient does have evidence of lower extremity edema.  She is advised to continue with conservative therapy including use of compression socks and elevation.  This is possibly related to venous insufficiency.  She also has some noted grade 1 diastolic insufficiency.  We will have the patient undergo a venous reflux study to determine if there is any evidence of venous reflux.  The patient may also be a good candidate for lymphedema pump.  2. Stenosis of left carotid artery The patient has known carotid stenosis which has not been checked in several years.  We will have a carotid duplex at patient's follow-up.  3. Type 2 diabetes mellitus with hyperglycemia, without long-term current use of insulin (HCC) Continue hypoglycemic medications as already ordered, these medications have been reviewed and there are no changes at this time.  Hgb A1C to be monitored as already arranged by primary service    Current Outpatient Medications on File Prior to Visit  Medication Sig Dispense Refill   acetaminophen (TYLENOL) 500 MG chewable tablet Chew 1,000 mg by mouth every 8 (eight) hours as needed for pain.     albuterol (VENTOLIN HFA) 108 (90 Base) MCG/ACT inhaler Inhale 2 puffs into the lungs every 6 (six) hours as needed for wheezing or shortness of breath. 18 g 2   aspirin EC 81 MG tablet Take 1 tablet (81 mg total) by mouth daily. Swallow whole. 90 tablet 3   cholecalciferol (VITAMIN D) 400 UNITS TABS tablet Take 2,000 Units by mouth daily.      CRANBERRY PO Take 25,000 mg by  mouth daily.     Fluticasone-Umeclidin-Vilant (TRELEGY ELLIPTA) 200-62.5-25 MCG/ACT AEPB Inhale one puff daily in the morning 60 each 2   glucose blood (ONETOUCH VERIO) test strip BLOOD SUGAR TESTING ONCE DAILY . DX E11.65 100 strip 3   hydrocortisone cream 0.5 % Apply topically as needed.      ibandronate (BONIVA) 150 MG tablet Take 1 tablet (150 mg total) by mouth every 30 (thirty) days. 3 tablet 3   Lancets (ONETOUCH DELICA PLUS EHOZYY48G) MISC Use  as directed twice a daily DX E11.65 100 each 3   Magnesium 250 MG TABS Take by mouth. Takes 1 tablet 2-3 times per week     metolazone (ZAROXOLYN) 5 MG tablet Take 0.5 tablets (2.5 mg total) by mouth daily. Take 30 mins prior to your Torsemide. 30 tablet 1   Multiple Vitamins-Minerals (MULTIVITAMIN ADULTS PO) Take 1 tablet by mouth daily.     omeprazole (PRILOSEC) 40 MG capsule Take 1 capsule (40 mg total) by mouth daily.     potassium chloride SA (KLOR-CON M) 20 MEQ tablet Take 2 tablets (40 mEq total) by mouth daily. 60 tablet 2   torsemide (DEMADEX) 20 MG tablet Take 1 tablet (20 mg total) by mouth daily. 30 tablet 1   ergocalciferol (DRISDOL) 1.25 MG (50000 UT) capsule Take one cap q week (Patient not taking: Reported on 01/19/2022) 12 capsule 3   ezetimibe (  ZETIA) 10 MG tablet Take 1 tablet (10 mg total) by mouth daily. (Patient not taking: Reported on 01/19/2022) 30 tablet 3   fluticasone (FLOVENT HFA) 110 MCG/ACT inhaler Inhale 2 puffs into the lungs 2 (two) times daily as needed. (Patient not taking: Reported on 01/19/2022)     metFORMIN (GLUCOPHAGE) 500 MG tablet Take 1 tablet (500 mg total) by mouth 2 (two) times daily with a meal. May take a half tablet. (Patient not taking: Reported on 01/19/2022) 180 tablet 1   montelukast (SINGULAIR) 10 MG tablet Take 10 mg by mouth daily as needed. (Patient not taking: Reported on 01/19/2022)     Simethicone 80 MG TABS Take 1 tablet (80 mg total) by mouth as needed (burping). As directed. (Patient not  taking: Reported on 01/19/2022)  0   No current facility-administered medications on file prior to visit.    There are no Patient Instructions on file for this visit. No follow-ups on file.   Kris Hartmann, NP

## 2022-01-30 ENCOUNTER — Encounter: Payer: Self-pay | Admitting: Cardiology

## 2022-01-30 ENCOUNTER — Ambulatory Visit: Payer: MEDICARE

## 2022-01-30 ENCOUNTER — Ambulatory Visit: Payer: Self-pay | Admitting: Physician Assistant

## 2022-01-31 ENCOUNTER — Encounter: Payer: Self-pay | Admitting: Internal Medicine

## 2022-01-31 ENCOUNTER — Ambulatory Visit (INDEPENDENT_AMBULATORY_CARE_PROVIDER_SITE_OTHER): Payer: Medicare Other | Admitting: Internal Medicine

## 2022-01-31 VITALS — BP 117/70 | HR 77 | Temp 98.4°F | Resp 16 | Ht 66.0 in | Wt 188.8 lb

## 2022-01-31 DIAGNOSIS — R300 Dysuria: Secondary | ICD-10-CM

## 2022-01-31 DIAGNOSIS — R0902 Hypoxemia: Secondary | ICD-10-CM

## 2022-01-31 DIAGNOSIS — R600 Localized edema: Secondary | ICD-10-CM | POA: Diagnosis not present

## 2022-01-31 DIAGNOSIS — J9611 Chronic respiratory failure with hypoxia: Secondary | ICD-10-CM

## 2022-01-31 DIAGNOSIS — E1165 Type 2 diabetes mellitus with hyperglycemia: Secondary | ICD-10-CM

## 2022-01-31 DIAGNOSIS — Z9981 Dependence on supplemental oxygen: Secondary | ICD-10-CM | POA: Diagnosis not present

## 2022-01-31 LAB — POCT URINALYSIS DIPSTICK
Glucose, UA: NEGATIVE
Nitrite, UA: NEGATIVE
Protein, UA: POSITIVE — AB
Spec Grav, UA: 1.01 (ref 1.010–1.025)
Urobilinogen, UA: 0.2 E.U./dL
pH, UA: 6.5 (ref 5.0–8.0)

## 2022-01-31 MED ORDER — NITROFURANTOIN MONOHYD MACRO 100 MG PO CAPS: ORAL_CAPSULE | ORAL | 0 refills | Status: DC

## 2022-01-31 NOTE — Progress Notes (Signed)
Checked pt's blood sugar and was 163 at 10:46 am.  Pt had 1/2 of banana on the way to appt for 10:20 am.  Pt advised she checked Bs at 8:30 am and was 147 fasting.  Pt was given 2 boxes of Steglatro 5 mg of samples per Dr Humphrey Rolls

## 2022-01-31 NOTE — Progress Notes (Signed)
[facility] [place], [place] [address]  Internal MEDICINE  Office Visit Note  Patient Name: Amber Holder  [id number]  [id number]  Date of Service: 01/31/2022  Chief Complaint  Patient presents with   Follow-up   Gastroesophageal Reflux   Diabetes    New medication has not helped to regulate sugar levels   Urinary Tract Infection    Burning when urinating and body aches/chills    HPI  Patient is here for routine follow-up on her diabetes, chronic respiratory failure with hypoxia and COPD.  Reports history milligrams was given on previous visit however she did not like the medication Her new problem today is the patient is having burning with urination body aches and chills Patient has been using her oxygen since previous visits  Current Medication: Outpatient Encounter Medications as of 01/31/2022  Medication Sig   acetaminophen (TYLENOL) 500 MG chewable tablet Chew 1,000 mg by mouth every 8 (eight) hours as needed for pain.   albuterol (VENTOLIN HFA) 108 (90 Base) MCG/ACT inhaler Inhale 2 puffs into the lungs every 6 (six) hours as needed for wheezing or shortness of breath.   aspirin EC 81 MG tablet Take 1 tablet (81 mg total) by mouth daily. Swallow whole.   buPROPion (WELLBUTRIN XL) 150 MG 24 hr tablet TAKE 1 TABLET BY MOUTH EVERY DAY   cholecalciferol (VITAMIN D) 400 UNITS TABS tablet Take 2,000 Units by mouth daily.    CRANBERRY PO Take 25,000 mg by mouth daily.   ergocalciferol (DRISDOL) 1.25 MG (50000 UT) capsule Take one cap q week   ezetimibe (ZETIA) 10 MG tablet Take 1 tablet (10 mg total) by mouth daily.   fluticasone (FLOVENT HFA) 110 MCG/ACT inhaler Inhale 2 puffs into the lungs 2 (two) times daily as needed.   Fluticasone-Umeclidin-Vilant (TRELEGY ELLIPTA) 200-62.5-25 MCG/ACT AEPB Inhale one puff daily in the morning   glucose blood (ONETOUCH VERIO) test strip BLOOD SUGAR TESTING ONCE DAILY . DX E11.65   hydrocortisone cream 0.5 % Apply  topically as needed.    ibandronate (BONIVA) 150 MG tablet Take 1 tablet (150 mg total) by mouth every 30 (thirty) days.   Lancets (ONETOUCH DELICA PLUS [id number]) MISC Use  as directed twice a daily DX E11.65   Magnesium 250 MG TABS Take by mouth. Takes 1 tablet 2-3 times per week   metFORMIN (GLUCOPHAGE) 500 MG tablet Take 1 tablet (500 mg total) by mouth 2 (two) times daily with a meal. May take a half tablet.   metolazone (ZAROXOLYN) 5 MG tablet Take 0.5 tablets (2.5 mg total) by mouth daily. Take 30 mins prior to your Torsemide.   montelukast (SINGULAIR) 10 MG tablet Take 10 mg by mouth daily as needed.   Multiple Vitamins-Minerals (MULTIVITAMIN ADULTS PO) Take 1 tablet by mouth daily.   omeprazole (PRILOSEC) 40 MG capsule Take 1 capsule (40 mg total) by mouth daily.   potassium chloride SA (KLOR-CON M) 20 MEQ tablet Take 2 tablets (40 mEq total) by mouth daily.   Simethicone 80 MG TABS Take 1 tablet (80 mg total) by mouth as needed (burping). As directed.   torsemide (DEMADEX) 20 MG tablet Take 1 tablet (20 mg total) by mouth daily.   No facility-administered encounter medications on file as of 01/31/2022.    Surgical History: Past Surgical History:  Procedure Laterality Date   APPENDECTOMY     cataract surgery Bilateral    CHOLECYSTECTOMY     COLONOSCOPY     COLONOSCOPY WITH PROPOFOL  N/A 05/28/2015   Procedure: COLONOSCOPY WITH PROPOFOL;  Surgeon: Lollie Sails, MD;  Location: [facility] ENDOSCOPY;  Service: Endoscopy;  Laterality: N/A;   COLONOSCOPY WITH PROPOFOL N/A 10/28/2018   Procedure: COLONOSCOPY WITH PROPOFOL;  Surgeon: Lollie Sails, MD;  Location: [facility] ENDOSCOPY;  Service: Endoscopy;  Laterality: N/A;   EYE SURGERY Bilateral 2015   Cataract Extraction with IOL   LITHOTRIPSY  2015   has had many stones   Mastoidotomy  1970   ROTATOR CUFF REPAIR Right 2011   TOTAL HIP ARTHROPLASTY Right 06/14/2016   Procedure: TOTAL HIP ARTHROPLASTY;  Surgeon: Dereck Leep, MD;   Location: [facility] ORS;  Service: Orthopedics;  Laterality: Right;    Medical History: Past Medical History:  Diagnosis Date   Anxiety 01/02/2012   Arthritis    Asthma    Blockage of coronary artery of heart (HCC)    Chronic kidney disease    Kidney stones   Colon polyps    COPD (chronic obstructive pulmonary disease) (HCC)    Diabetes mellitus without complication (HCC)    GERD (gastroesophageal reflux disease)    HOH (hard of hearing)    Bilateral hearing aids   Kidney stone    Osteoporosis    Sleep apnea    Does not use C-PAP on a regular basis   Venous stasis     Family History: Family History  Problem Relation Age of Onset   Breast cancer Maternal Aunt 30   Bladder Cancer Neg Hx    Kidney cancer Neg Hx     Social History   Socioeconomic History   Marital status: Divorced    Spouse name: Not on file   Number of children: Not on file   Years of education: Not on file   Highest education level: Not on file  Occupational History   Not on file  Tobacco Use   Smoking status: Former    Packs/day: 1.00    Types: Cigarettes   Smokeless tobacco: Never   Tobacco comments:    1 pack daily//quit 2 weeks ago  Vaping Use   Vaping Use: Never used  Substance and Sexual Activity   Alcohol use: Not Currently    Comment: very rarely    Drug use: No   Sexual activity: Not Currently  Other Topics Concern   Not on file  Social History Narrative   Not on file   Social Determinants of Health   Financial Resource Strain: Not on file  Food Insecurity: Not on file  Transportation Needs: Not on file  Physical Activity: Not on file  Stress: Not on file  Social Connections: Not on file  Intimate Partner Violence: Not on file      Review of Systems  Constitutional:  Negative for chills, fatigue and unexpected weight change.  HENT:  Negative for congestion, postnasal drip, rhinorrhea, sneezing and sore throat.   Eyes:  Negative for redness.  Respiratory:  Negative for  cough, chest tightness and shortness of breath.   Cardiovascular:  Negative for chest pain and palpitations.  Gastrointestinal:  Negative for abdominal pain, constipation, diarrhea, nausea and vomiting.  Genitourinary:  Negative for dysuria and frequency.  Musculoskeletal:  Negative for arthralgias, back pain, joint swelling and neck pain.  Skin:  Negative for rash.  Neurological: Negative.  Negative for tremors and numbness.  Hematological:  Negative for adenopathy. Does not bruise/bleed easily.  Psychiatric/Behavioral:  Negative for behavioral problems (Depression), sleep disturbance and suicidal ideas. The patient is not nervous/anxious.  Vital Signs: BP 117/70   Pulse 77   Temp 98.4 F (36.9 C)   Resp 16   Ht '5\' 6"'$  (1.676 m)   Wt 188 lb 12.8 oz (85.6 kg)   SpO2 92%   BMI 30.47 kg/m    Physical Exam Constitutional:      Appearance: Normal appearance.  HENT:     Head: Normocephalic and atraumatic.     Nose: Nose normal.     Mouth/Throat:     Mouth: Mucous membranes are moist.     Pharynx: No posterior oropharyngeal erythema.  Eyes:     Extraocular Movements: Extraocular movements intact.     Pupils: Pupils are equal, round, and reactive to light.  Cardiovascular:     Pulses: Normal pulses.     Heart sounds: Normal heart sounds.  Pulmonary:     Effort: Pulmonary effort is normal.     Breath sounds: Normal breath sounds.  Neurological:     General: No focal deficit present.     Mental Status: She is alert.  Psychiatric:        Mood and Affect: Mood normal.        Behavior: Behavior normal.       Assessment/Plan: 1. Chronic respiratory failure with hypoxia (HCC) We will continue on oxygen as before  2. Bilateral lower extremity edema Patient is on torsemide and metolazone per cardiology we will continue to monitor we will also get a baseline metabolic Test done on her - Basic Metabolic Panel (BMET)  3. Type 2 diabetes mellitus with hyperglycemia, without  long-term current use of insulin (HCC) Stop Rybelsus and samples of Steglatro 5 mg once a day is given to the patient  4. Hypoxemia requiring supplemental oxygen Continue oxygen as before  5. Dysuria Patient had a UTI we will start her on Macrobid for now repeat urine cultures on next visit - POCT Urinalysis Dipstick - CULTURE, URINE COMPREHENSIVE - nitrofurantoin, macrocrystal-monohydrate, (MACROBID) 100 MG capsule; Use as instructed  Dispense: 20 capsule; Refill: 0   General Counseling: Brownie verbalizes understanding of the findings of todays visit and agrees with plan of treatment. I have discussed any further diagnostic evaluation that may be needed or ordered today. We also reviewed her medications today. she has been encouraged to call the office with any questions or concerns that should arise related to todays visit.    Orders Placed This Encounter  Procedures   CULTURE, URINE COMPREHENSIVE   POCT Urinalysis Dipstick    No orders of the defined types were placed in this encounter.   Total time spent:35 Minutes Time spent includes review of chart, medications, test results, and follow up plan with the patient.   [place] Controlled Substance Database was reviewed by me.   Dr Lavera Guise Internal medicine

## 2022-02-01 ENCOUNTER — Ambulatory Visit: Payer: MEDICARE

## 2022-02-02 ENCOUNTER — Other Ambulatory Visit: Payer: Self-pay

## 2022-02-02 DIAGNOSIS — E1165 Type 2 diabetes mellitus with hyperglycemia: Secondary | ICD-10-CM

## 2022-02-02 MED ORDER — ONETOUCH VERIO VI STRP: ORAL_STRIP | 3 refills | Status: DC

## 2022-02-06 ENCOUNTER — Ambulatory Visit: Payer: MEDICARE

## 2022-02-06 LAB — CULTURE, URINE COMPREHENSIVE

## 2022-02-08 ENCOUNTER — Ambulatory Visit: Payer: MEDICARE

## 2022-02-08 NOTE — Progress Notes (Signed)
Pt has been on macrobid

## 2022-02-09 ENCOUNTER — Other Ambulatory Visit: Payer: Self-pay

## 2022-02-09 ENCOUNTER — Telehealth: Payer: Self-pay

## 2022-02-09 ENCOUNTER — Other Ambulatory Visit
Admission: RE | Admit: 2022-02-09 | Discharge: 2022-02-09 | Disposition: A | Discharge status: Home / Self Care | Payer: Medicare Other | Attending: Internal Medicine | Admitting: Internal Medicine

## 2022-02-09 DIAGNOSIS — R609 Edema, unspecified: Secondary | ICD-10-CM | POA: Insufficient documentation

## 2022-02-09 DIAGNOSIS — R300 Dysuria: Secondary | ICD-10-CM

## 2022-02-09 LAB — BASIC METABOLIC PANEL
Anion gap: 11 (ref 5–15)
BUN: 33 mg/dL — ABNORMAL HIGH (ref 8–23)
CO2: 28 mmol/L (ref 22–32)
Calcium: 9.5 mg/dL (ref 8.9–10.3)
Chloride: 99 mmol/L (ref 98–111)
Creatinine, Ser: 0.86 mg/dL (ref 0.44–1.00)
GFR, Estimated: >60 mL/min (ref >60)
Glucose, Bld: 127 mg/dL — ABNORMAL HIGH (ref 70–99)
Potassium: 3.2 mmol/L — ABNORMAL LOW (ref 3.5–5.1)
Sodium: 138 mmol/L (ref 135–145)

## 2022-02-09 MED ORDER — NITROFURANTOIN MONOHYD MACRO 100 MG PO CAPS: 100.0000 mg | ORAL_CAPSULE | Freq: Two times a day (BID) | ORAL | 0 refills | Status: DC

## 2022-02-09 NOTE — Progress Notes (Signed)
Pt was notified.  

## 2022-02-09 NOTE — Telephone Encounter (Signed)
Pt advised that we send more antibiotic for UTI and we can recheck next week

## 2022-02-09 NOTE — Telephone Encounter (Addendum)
Pt advised that her potassium is low  as per dr Clayborn Bigness advised her go head take 1 extra potassium(Klor-con) 20 mg  today total 3 tab today and 3 tab po tomorrow and go back to normal dosing as prescribed and also advised her hold torsemide until her next appt next Tuesday and continue metolazone 2.5 mg daily

## 2022-02-13 ENCOUNTER — Ambulatory Visit: Payer: MEDICARE

## 2022-02-14 ENCOUNTER — Ambulatory Visit (INDEPENDENT_AMBULATORY_CARE_PROVIDER_SITE_OTHER): Payer: Medicare Other | Admitting: Internal Medicine

## 2022-02-14 VITALS — BP 117/68 | HR 72 | Temp 97.6°F | Resp 16 | Ht 66.0 in | Wt 189.0 lb

## 2022-02-14 DIAGNOSIS — N390 Urinary tract infection, site not specified: Secondary | ICD-10-CM

## 2022-02-14 DIAGNOSIS — E1165 Type 2 diabetes mellitus with hyperglycemia: Secondary | ICD-10-CM

## 2022-02-14 DIAGNOSIS — E876 Hypokalemia: Secondary | ICD-10-CM | POA: Diagnosis not present

## 2022-02-14 DIAGNOSIS — J9611 Chronic respiratory failure with hypoxia: Secondary | ICD-10-CM

## 2022-02-14 DIAGNOSIS — R300 Dysuria: Secondary | ICD-10-CM | POA: Diagnosis not present

## 2022-02-14 DIAGNOSIS — I5032 Chronic diastolic (congestive) heart failure: Secondary | ICD-10-CM | POA: Diagnosis not present

## 2022-02-14 LAB — POCT URINALYSIS DIPSTICK
Glucose, UA: POSITIVE — AB
Nitrite, UA: NEGATIVE
Protein, UA: NEGATIVE
Spec Grav, UA: 1.015 (ref 1.010–1.025)
Urobilinogen, UA: 0.2 E.U./dL
pH, UA: 7 (ref 5.0–8.0)

## 2022-02-14 NOTE — Progress Notes (Signed)
[facility] [place], [place] [address]  Internal MEDICINE  Office Visit Note  Patient Name: Amber Holder  [id number]  [id number]  Date of Service: 02/27/2022  Chief Complaint  Patient presents with   Follow-up    Review progress with medications   Urinary Tract Infection    Still having some UTI symptoms, states antibiotics are only helping a little - will recheck urine    HPI Patient seen for multiple medical problems. 1.  She has been having symptoms of dysuria has been on antibiotics however the symptoms have not been resolved, will continue to extend antibiotics. 2.  Patient is on chronic diuretic therapy due to chronic diastolic congestive heart failure and hypoxemia due to chronic respiratory failure her potassium level was low and has been replaced she will need monitoring. 3.  Patient was also started on oxygen has COPD and chronic respiratory failure desaturations were present patient does have follow-up with pulmonary. 4.  Her diabetic numbers are better she has been getting samples of Steglatro 5.  She has done well with smoking cessation has not smoked in the last few months    Current Medication: Outpatient Encounter Medications as of 02/14/2022  Medication Sig   acetaminophen (TYLENOL) 500 MG chewable tablet Chew 1,000 mg by mouth every 8 (eight) hours as needed for pain.   albuterol (VENTOLIN HFA) 108 (90 Base) MCG/ACT inhaler Inhale 2 puffs into the lungs every 6 (six) hours as needed for wheezing or shortness of breath.   aspirin EC 81 MG tablet Take 1 tablet (81 mg total) by mouth daily. Swallow whole.   buPROPion (WELLBUTRIN XL) 150 MG 24 hr tablet TAKE 1 TABLET BY MOUTH EVERY DAY   cholecalciferol (VITAMIN D) 400 UNITS TABS tablet Take 2,000 Units by mouth daily.    CRANBERRY PO Take 25,000 mg by mouth daily.   ergocalciferol (DRISDOL) 1.25 MG (50000 UT) capsule Take one cap q week   fluticasone (FLOVENT HFA) 110 MCG/ACT inhaler Inhale  2 puffs into the lungs 2 (two) times daily as needed.   Fluticasone-Umeclidin-Vilant (TRELEGY ELLIPTA) 200-62.5-25 MCG/ACT AEPB Inhale one puff daily in the morning   glucose blood (ONETOUCH VERIO) test strip BLOOD SUGAR TESTING ONCE DAILY . DX E11.65   hydrocortisone cream 0.5 % Apply topically as needed.    ibandronate (BONIVA) 150 MG tablet Take 1 tablet (150 mg total) by mouth every 30 (thirty) days.   Lancets (ONETOUCH DELICA PLUS [id number]) MISC Use  as directed twice a daily DX E11.65   Magnesium 250 MG TABS Take by mouth. Takes 1 tablet 2-3 times per week   metFORMIN (GLUCOPHAGE) 500 MG tablet Take 1 tablet (500 mg total) by mouth 2 (two) times daily with a meal. May take a half tablet.   metolazone (ZAROXOLYN) 5 MG tablet Take 0.5 tablets (2.5 mg total) by mouth daily. Take 30 mins prior to your Torsemide. (Patient taking differently: Take 5 mg by mouth daily. Take 30 mins prior to your Torsemide.)   montelukast (SINGULAIR) 10 MG tablet Take 10 mg by mouth daily as needed.   Multiple Vitamins-Minerals (MULTIVITAMIN ADULTS PO) Take 1 tablet by mouth daily.   nitrofurantoin, macrocrystal-monohydrate, (MACROBID) 100 MG capsule Take 1 capsule (100 mg total) by mouth 2 (two) times daily. Use as instructed   omeprazole (PRILOSEC) 40 MG capsule Take 1 capsule (40 mg total) by mouth daily.   potassium chloride SA (KLOR-CON M) 20 MEQ tablet Take 2 tablets (40 mEq total) by mouth  daily.   Simethicone 80 MG TABS Take 1 tablet (80 mg total) by mouth as needed (burping). As directed.   torsemide (DEMADEX) 20 MG tablet Take 1 tablet (20 mg total) by mouth daily. (Patient not taking: Reported on 02/24/2022)   [DISCONTINUED] ezetimibe (ZETIA) 10 MG tablet Take 1 tablet (10 mg total) by mouth daily. (Patient not taking: Reported on 02/24/2022)   No facility-administered encounter medications on file as of 02/14/2022.    Surgical History: Past Surgical History:  Procedure Laterality Date   APPENDECTOMY      cataract surgery Bilateral    CHOLECYSTECTOMY     COLONOSCOPY     COLONOSCOPY WITH PROPOFOL N/A 05/28/2015   Procedure: COLONOSCOPY WITH PROPOFOL;  Surgeon: Lollie Sails, MD;  Location: [facility] ENDOSCOPY;  Service: Endoscopy;  Laterality: N/A;   COLONOSCOPY WITH PROPOFOL N/A 10/28/2018   Procedure: COLONOSCOPY WITH PROPOFOL;  Surgeon: Lollie Sails, MD;  Location: [facility] ENDOSCOPY;  Service: Endoscopy;  Laterality: N/A;   EYE SURGERY Bilateral 2015   Cataract Extraction with IOL   LITHOTRIPSY  2015   has had many stones   Mastoidotomy  1970   ROTATOR CUFF REPAIR Right 2011   TOTAL HIP ARTHROPLASTY Right 06/14/2016   Procedure: TOTAL HIP ARTHROPLASTY;  Surgeon: Dereck Leep, MD;  Location: [facility] ORS;  Service: Orthopedics;  Laterality: Right;    Medical History: Past Medical History:  Diagnosis Date   Anxiety 01/02/2012   Arthritis    Asthma    Blockage of coronary artery of heart (HCC)    Chronic kidney disease    Kidney stones   Colon polyps    COPD (chronic obstructive pulmonary disease) (HCC)    Diabetes mellitus without complication (HCC)    GERD (gastroesophageal reflux disease)    HOH (hard of hearing)    Bilateral hearing aids   Kidney stone    Osteoporosis    Sleep apnea    Does not use C-PAP on a regular basis   Venous stasis     Family History: Family History  Problem Relation Age of Onset   Breast cancer Maternal Aunt 9   Bladder Cancer Neg Hx    Kidney cancer Neg Hx     Social History   Socioeconomic History   Marital status: Divorced    Spouse name: Not on file   Number of children: Not on file   Years of education: Not on file   Highest education level: Not on file  Occupational History   Not on file  Tobacco Use   Smoking status: Former    Packs/day: 1.00    Types: Cigarettes   Smokeless tobacco: Never   Tobacco comments:    1 pack daily//quit 2 weeks ago  Vaping Use   Vaping Use: Never used  Substance and Sexual Activity    Alcohol use: Not Currently    Comment: very rarely    Drug use: No   Sexual activity: Not Currently  Other Topics Concern   Not on file  Social History Narrative   Not on file   Social Determinants of Health   Financial Resource Strain: Not on file  Food Insecurity: Not on file  Transportation Needs: Not on file  Physical Activity: Not on file  Stress: Not on file  Social Connections: Not on file  Intimate Partner Violence: Not on file      Review of Systems  Constitutional:  Negative for chills, fatigue and unexpected weight change.  HENT:  Negative for  congestion, postnasal drip, rhinorrhea, sneezing and sore throat.   Eyes:  Negative for redness.  Respiratory:  Positive for shortness of breath. Negative for cough and chest tightness.   Cardiovascular:  Negative for chest pain and palpitations.  Gastrointestinal:  Negative for abdominal pain, constipation, diarrhea, nausea and vomiting.  Genitourinary:  Negative for dysuria and frequency.  Musculoskeletal:  Negative for arthralgias, back pain, joint swelling and neck pain.  Skin:  Negative for rash.  Neurological: Negative.  Negative for tremors and numbness.  Hematological:  Negative for adenopathy. Does not bruise/bleed easily.  Psychiatric/Behavioral:  Negative for behavioral problems (Depression), sleep disturbance and suicidal ideas. The patient is not nervous/anxious.     Vital Signs: BP 117/68   Pulse 72   Temp 97.6 F (36.4 C)   Resp 16   Ht '5\' 6"'$  (1.676 m)   Wt 189 lb (85.7 kg)   SpO2 93%   BMI 30.51 kg/m    Physical Exam Constitutional:      Appearance: Normal appearance.  HENT:     Head: Normocephalic and atraumatic.     Nose: Nose normal.     Mouth/Throat:     Mouth: Mucous membranes are moist.     Pharynx: No posterior oropharyngeal erythema.  Eyes:     Extraocular Movements: Extraocular movements intact.     Pupils: Pupils are equal, round, and reactive to light.  Cardiovascular:      Pulses: Normal pulses.     Heart sounds: Normal heart sounds.  Pulmonary:     Effort: Pulmonary effort is normal.     Breath sounds: Normal breath sounds.  Neurological:     General: No focal deficit present.     Mental Status: She is alert.  Psychiatric:        Mood and Affect: Mood normal.        Behavior: Behavior normal.        Assessment/Plan: 1. Chronic UTI We will continue on Macrobid 100 mg twice a day for now in 2021.  Cultures are negative - POCT Urinalysis Dipstick - CULTURE, URINE COMPREHENSIVE  2. Chronic respiratory failure with hypoxia (HCC) Continue on Trelegy and oxygen patient will need a CT scan chest and follow-up with pulmonary  3. Type 2 diabetes mellitus with hyperglycemia, without long-term current use of insulin (HCC) Increase Steglatro to 15 mg once a day samples are given  4. Hypokalemia Patient is on Demadex and Zaroxolyn, this is followed by cardiology potassium level has been supplemented - Basic Metabolic Panel (BMET)  5. Chronic diastolic congestive heart failure ([place]) Followed by cardiology  General Counseling: Chenee verbalizes understanding of the findings of todays visit and agrees with plan of treatment. I have discussed any further diagnostic evaluation that may be needed or ordered today. We also reviewed her medications today. she has been encouraged to call the office with any questions or concerns that should arise related to todays visit.    Orders Placed This Encounter  Procedures   CULTURE, URINE COMPREHENSIVE   Basic Metabolic Panel (BMET)   POCT Urinalysis Dipstick    No orders of the defined types were placed in this encounter.   Total time spent:45 Minutes Time spent includes review of chart, medications, test results, and follow up plan with the patient.   [place] Controlled Substance Database was reviewed by me.   Dr Lavera Guise Internal medicine

## 2022-02-15 ENCOUNTER — Ambulatory Visit: Payer: MEDICARE | Admitting: Physical Therapy

## 2022-02-17 LAB — CULTURE, URINE COMPREHENSIVE

## 2022-02-20 ENCOUNTER — Ambulatory Visit: Payer: MEDICARE

## 2022-02-22 ENCOUNTER — Ambulatory Visit: Payer: MEDICARE

## 2022-02-22 ENCOUNTER — Other Ambulatory Visit
Admission: RE | Admit: 2022-02-22 | Discharge: 2022-02-22 | Disposition: A | Discharge status: Home / Self Care | Payer: Medicare Other | Attending: Internal Medicine | Admitting: Internal Medicine

## 2022-02-22 DIAGNOSIS — J9611 Chronic respiratory failure with hypoxia: Secondary | ICD-10-CM | POA: Insufficient documentation

## 2022-02-22 LAB — BASIC METABOLIC PANEL
Anion gap: 6 (ref 5–15)
BUN: 23 mg/dL (ref 8–23)
CO2: 27 mmol/L (ref 22–32)
Calcium: 9.3 mg/dL (ref 8.9–10.3)
Chloride: 107 mmol/L (ref 98–111)
Creatinine, Ser: 0.77 mg/dL (ref 0.44–1.00)
GFR, Estimated: >60 mL/min (ref >60)
Glucose, Bld: 115 mg/dL — ABNORMAL HIGH (ref 70–99)
Potassium: 3.5 mmol/L (ref 3.5–5.1)
Sodium: 140 mmol/L (ref 135–145)

## 2022-02-24 ENCOUNTER — Ambulatory Visit (INDEPENDENT_AMBULATORY_CARE_PROVIDER_SITE_OTHER): Payer: Medicare Other

## 2022-02-24 ENCOUNTER — Encounter: Payer: Self-pay | Admitting: Cardiology

## 2022-02-24 ENCOUNTER — Ambulatory Visit (INDEPENDENT_AMBULATORY_CARE_PROVIDER_SITE_OTHER): Payer: Medicare Other | Admitting: Cardiology

## 2022-02-24 ENCOUNTER — Other Ambulatory Visit (INDEPENDENT_AMBULATORY_CARE_PROVIDER_SITE_OTHER): Payer: Self-pay | Admitting: Nurse Practitioner

## 2022-02-24 ENCOUNTER — Ambulatory Visit (INDEPENDENT_AMBULATORY_CARE_PROVIDER_SITE_OTHER): Payer: Medicare Other | Admitting: Nurse Practitioner

## 2022-02-24 ENCOUNTER — Encounter (INDEPENDENT_AMBULATORY_CARE_PROVIDER_SITE_OTHER): Payer: Self-pay | Admitting: Nurse Practitioner

## 2022-02-24 VITALS — BP 112/64 | HR 84 | Resp 16 | Ht 66.0 in | Wt 190.0 lb

## 2022-02-24 VITALS — BP 122/66 | HR 70 | Ht 66.0 in | Wt 189.4 lb

## 2022-02-24 DIAGNOSIS — E7800 Pure hypercholesterolemia, unspecified: Secondary | ICD-10-CM

## 2022-02-24 DIAGNOSIS — R600 Localized edema: Secondary | ICD-10-CM | POA: Diagnosis not present

## 2022-02-24 DIAGNOSIS — I6522 Occlusion and stenosis of left carotid artery: Secondary | ICD-10-CM

## 2022-02-24 DIAGNOSIS — R609 Edema, unspecified: Secondary | ICD-10-CM

## 2022-02-24 DIAGNOSIS — I6523 Occlusion and stenosis of bilateral carotid arteries: Secondary | ICD-10-CM

## 2022-02-24 DIAGNOSIS — I2510 Atherosclerotic heart disease of native coronary artery without angina pectoris: Secondary | ICD-10-CM

## 2022-02-24 DIAGNOSIS — R0609 Other forms of dyspnea: Secondary | ICD-10-CM

## 2022-02-24 DIAGNOSIS — E1165 Type 2 diabetes mellitus with hyperglycemia: Secondary | ICD-10-CM | POA: Diagnosis not present

## 2022-02-24 NOTE — Progress Notes (Signed)
Cardiology Office Note:    Date:  02/24/2022   ID:  Amber Holder, DOB 07-11-1946, MRN [id number]  PCP:  Lavera Guise, MD   [facility] Providers Cardiologist:  None     Referring MD: Lavera Guise, MD   Chief Complaint  Patient presents with   Follow-up    2 month follow up. Patient states that she is fine just has a hard time getting air. Patient also states that she has pain in the right leg. Meds reviewed with patient.     History of Present Illness:    Amber Holder is a 76 y.o. female with a hx of CAD (CTO RCA mild to moderate LCx and LAD dz), hyperlipidemia, diabetes, COPD, former smoker x40+ years, PAD/carotid stenosis who presents for follow-up.    Being seen for CAD, leg edema and hyperlipidemia.  Previously did not tolerate Lipitor, started on Zetia but patient worried this might interfere with her kidneys.  Takes torsemide and metolazone, has varicose veins.  BMP showed low potassium, torsemide was stopped by PCP, patient advised to continue metolazone.  She takes medications as prescribed.  Recently had hypoxia with ambulation, started on oxygen.  She quit smoking 2 months ago, takes Wellbutrin to help with cravings.    Prior notes Coronary CTA 10/2021 calcium score 790, CTO proximal RCA, nonobstructive LAD and left circumflex disease Outside echocardiogram 2021 showed normal systolic function, diastolic dysfunction EF 40% Chest CT 06/2016 showed coronary artery calcification  Past Medical History:  Diagnosis Date   Anxiety 01/02/2012   Arthritis    Asthma    Blockage of coronary artery of heart (HCC)    Chronic kidney disease    Kidney stones   Colon polyps    COPD (chronic obstructive pulmonary disease) (HCC)    Diabetes mellitus without complication (HCC)    GERD (gastroesophageal reflux disease)    HOH (hard of hearing)    Bilateral hearing aids   Kidney stone    Osteoporosis    Sleep apnea    Does not use C-PAP on a regular basis   Venous stasis      Past Surgical History:  Procedure Laterality Date   APPENDECTOMY     cataract surgery Bilateral    CHOLECYSTECTOMY     COLONOSCOPY     COLONOSCOPY WITH PROPOFOL N/A 05/28/2015   Procedure: COLONOSCOPY WITH PROPOFOL;  Surgeon: Lollie Sails, MD;  Location: [facility] ENDOSCOPY;  Service: Endoscopy;  Laterality: N/A;   COLONOSCOPY WITH PROPOFOL N/A 10/28/2018   Procedure: COLONOSCOPY WITH PROPOFOL;  Surgeon: Lollie Sails, MD;  Location: [facility] ENDOSCOPY;  Service: Endoscopy;  Laterality: N/A;   EYE SURGERY Bilateral 2015   Cataract Extraction with IOL   LITHOTRIPSY  2015   has had many stones   Mastoidotomy  1970   ROTATOR CUFF REPAIR Right 2011   TOTAL HIP ARTHROPLASTY Right 06/14/2016   Procedure: TOTAL HIP ARTHROPLASTY;  Surgeon: Dereck Leep, MD;  Location: [facility] ORS;  Service: Orthopedics;  Laterality: Right;    Current Medications: Current Meds  Medication Sig   acetaminophen (TYLENOL) 500 MG chewable tablet Chew 1,000 mg by mouth every 8 (eight) hours as needed for pain.   albuterol (VENTOLIN HFA) 108 (90 Base) MCG/ACT inhaler Inhale 2 puffs into the lungs every 6 (six) hours as needed for wheezing or shortness of breath.   aspirin EC 81 MG tablet Take 1 tablet (81 mg total) by mouth daily. Swallow whole.   buPROPion (WELLBUTRIN XL) 150 MG  24 hr tablet TAKE 1 TABLET BY MOUTH EVERY DAY   cholecalciferol (VITAMIN D) 400 UNITS TABS tablet Take 2,000 Units by mouth daily.    CRANBERRY PO Take 25,000 mg by mouth daily.   ergocalciferol (DRISDOL) 1.25 MG (50000 UT) capsule Take one cap q week   ertugliflozin L-PyroglutamicAc (STEGLATRO) 15 MG TABS tablet Take 15 mg by mouth daily.   fluticasone (FLOVENT HFA) 110 MCG/ACT inhaler Inhale 2 puffs into the lungs 2 (two) times daily as needed.   Fluticasone-Umeclidin-Vilant (TRELEGY ELLIPTA) 200-62.5-25 MCG/ACT AEPB Inhale one puff daily in the morning   glucose blood (ONETOUCH VERIO) test strip BLOOD SUGAR TESTING ONCE DAILY . DX  E11.65   hydrocortisone cream 0.5 % Apply topically as needed.    ibandronate (BONIVA) 150 MG tablet Take 1 tablet (150 mg total) by mouth every 30 (thirty) days.   Lancets (ONETOUCH DELICA PLUS [id number]) MISC Use  as directed twice a daily DX E11.65   Magnesium 250 MG TABS Take by mouth. Takes 1 tablet 2-3 times per week   metFORMIN (GLUCOPHAGE) 500 MG tablet Take 1 tablet (500 mg total) by mouth 2 (two) times daily with a meal. May take a half tablet.   metolazone (ZAROXOLYN) 5 MG tablet Take 0.5 tablets (2.5 mg total) by mouth daily. Take 30 mins prior to your Torsemide. (Patient taking differently: Take 5 mg by mouth daily. Take 30 mins prior to your Torsemide.)   montelukast (SINGULAIR) 10 MG tablet Take 10 mg by mouth daily as needed.   Multiple Vitamins-Minerals (MULTIVITAMIN ADULTS PO) Take 1 tablet by mouth daily.   nitrofurantoin, macrocrystal-monohydrate, (MACROBID) 100 MG capsule Take 1 capsule (100 mg total) by mouth 2 (two) times daily. Use as instructed   omeprazole (PRILOSEC) 40 MG capsule Take 1 capsule (40 mg total) by mouth daily.   potassium chloride SA (KLOR-CON M) 20 MEQ tablet Take 2 tablets (40 mEq total) by mouth daily.   Simethicone 80 MG TABS Take 1 tablet (80 mg total) by mouth as needed (burping). As directed.     Allergies:   Iodinated contrast media, Shellfish allergy, Sulfa antibiotics, Sulfasalazine, Percocet [oxycodone-acetaminophen], and Iodine   Social History   Socioeconomic History   Marital status: Divorced    Spouse name: Not on file   Number of children: Not on file   Years of education: Not on file   Highest education level: Not on file  Occupational History   Not on file  Tobacco Use   Smoking status: Former    Packs/day: 1.00    Types: Cigarettes   Smokeless tobacco: Never   Tobacco comments:    1 pack daily//quit 2 weeks ago  Vaping Use   Vaping Use: Never used  Substance and Sexual Activity   Alcohol use: Not Currently    Comment:  very rarely    Drug use: No   Sexual activity: Not Currently  Other Topics Concern   Not on file  Social History Narrative   Not on file   Social Determinants of Health   Financial Resource Strain: Not on file  Food Insecurity: Not on file  Transportation Needs: Not on file  Physical Activity: Not on file  Stress: Not on file  Social Connections: Not on file     Family History: The patient's family history includes Breast cancer (age of onset: 66) in her maternal aunt. There is no history of Bladder Cancer or Kidney cancer.  ROS:   Please see the history of present  illness.     All other systems reviewed and are negative.  EKGs/Labs/Other Studies Reviewed:    The following studies were reviewed today:   EKG:  EKG is  ordered today.  EKG shows normal sinus rhythm, normal ECG  Recent Labs: 06/13/2021: ALT 16 02/22/2022: BUN 23; Creatinine, Ser 0.77; Potassium 3.5; Sodium 140  Recent Lipid Panel    Component Value Date/Time   CHOL 261 (H) 01/16/2022 0952   CHOL 239 (H) 12/24/2020 1154   TRIG 134 01/16/2022 0952   HDL 52 01/16/2022 0952   HDL 55 12/24/2020 1154   CHOLHDL 5.0 01/16/2022 0952   VLDL 27 01/16/2022 0952   LDLCALC 182 (H) 01/16/2022 0952   LDLCALC 168 (H) 12/24/2020 1154     Risk Assessment/Calculations:          Physical Exam:    VS:  BP 122/66 (BP Location: Left Arm, Patient Position: Sitting, Cuff Size: Normal)   Pulse 70   Ht '5\' 6"'$  (1.676 m)   Wt 189 lb 6.4 oz (85.9 kg)   SpO2 92%   BMI 30.57 kg/m     Wt Readings from Last 3 Encounters:  02/24/22 189 lb 6.4 oz (85.9 kg)  02/14/22 189 lb (85.7 kg)  01/31/22 188 lb 12.8 oz (85.6 kg)     GEN:  Well nourished, well developed in no acute distress HEENT: Normal NECK: No JVD; No carotid bruits CARDIAC: RRR, no murmurs, rubs, gallops RESPIRATORY: Decreased breath sounds, no wheezing. ABDOMEN: Soft, non-tender, non-distended MUSCULOSKELETAL:  1+ nonpitting edema;,?  Lymphedema SKIN: Warm  and dry NEUROLOGIC:  Alert and oriented x 3 PSYCHIATRIC:  Normal affect   ASSESSMENT:    1. Coronary artery disease involving native coronary artery of native heart, unspecified whether angina present   2. Pure hypercholesterolemia   3. Dyspnea on exertion   4. Leg edema     PLAN:    In order of problems listed above:  CAD, CTO RCA.  Calcium score 790.  Denies chest pain.  Continue aspirin 81 mg daily, encouraged to start Zetia, has allergies to Lipitor.  Echo EF 60 to 65%, impaired relaxation.  No plans for left heart cath, as patient has CTO, patient also has iodine allergies. Dyspnea on exertion, likely from COPD, OSA, current smoking   Hyperlipidemia, cramping with Lipitor.  Advised to start Zetia 10 mg daily.  Check lipid panel in 4 months.  Consider updating bempedoic acid if cholesterol still elevated Leg edema bilaterally, on metolazone 2.5 mg daily.  Varicose veins likely contributing managed by PCP.  Has appointment with [place] [facility] today, she has varicose veins, component of lymphedema possible.  Continue compression stockings.  Follow-up in 6 months    Total encounter time more than 40 minutes  Greater than 50% was spent in counseling and coordination of care with the patient    Medication Adjustments/Labs and Tests Ordered: Current medicines are reviewed at length with the patient today.  Concerns regarding medicines are outlined above.  Orders Placed This Encounter  Procedures   Lipid panel   EKG 12-Lead   No orders of the defined types were placed in this encounter.   Patient Instructions  Medication Instructions:   Your physician recommends that you continue on your current medications as directed. Please refer to the Current Medication list given to you today.  *If you need a refill on your cardiac medications before your next appointment, please call your pharmacy*   Lab Work:  Your physician  recommends that you return for a  FASTING lipid profile:  4 months  - You will need to be fasting. Please do not have anything to eat or drink after midnight the morning you have the lab work. You may only have water or black coffee with no cream or sugar.   - Please go to the [facility]. You will check in at the front desk to the right as you walk into the atrium. Valet Parking is offered if needed. - No appointment needed. You may go any day between 7 am and 6 pm.     Follow-Up: At [facility], you and your health needs are our priority.  As part of our continuing mission to provide you with exceptional heart care, we have created designated Provider Care Teams.  These Care Teams include your primary Cardiologist (physician) and Advanced Practice Providers (APPs -  Physician Assistants and Nurse Practitioners) who all work together to provide you with the care you need, when you need it.  We recommend signing up for the patient portal called "MyChart".  Sign up information is provided on this After Visit Summary.  MyChart is used to connect with patients for Virtual Visits (Telemedicine).  Patients are able to view lab/test results, encounter notes, upcoming appointments, etc.  Non-urgent messages can be sent to your provider as well.   To learn more about what you can do with MyChart, go to [email or web address].    Your next appointment:   39months)   The format for your next appointment:   In Person  Provider:   BKate Sable MD    Other Instructions   Important Information About Sugar         Signed, BKate Sable MD  02/24/2022 12:09 PM    [place]

## 2022-02-24 NOTE — Patient Instructions (Signed)
Medication Instructions:   Your physician recommends that you continue on your current medications as directed. Please refer to the Current Medication list given to you today.  *If you need a refill on your cardiac medications before your next appointment, please call your pharmacy*   Lab Work:  Your physician recommends that you return for a FASTING lipid profile:  4 months  - You will need to be fasting. Please do not have anything to eat or drink after midnight the morning you have the lab work. You may only have water or black coffee with no cream or sugar.   - Please go to the [facility]. You will check in at the front desk to the right as you walk into the atrium. Valet Parking is offered if needed. - No appointment needed. You may go any day between 7 am and 6 pm.     Follow-Up: At [facility] you and your health needs are our priority.  As part of our continuing mission to provide you with exceptional heart care, we have created designated Provider Care Teams.  These Care Teams include your primary Cardiologist (physician) and Advanced Practice Providers (APPs -  Physician Assistants and Nurse Practitioners) who all work together to provide you with the care you need, when you need it.  We recommend signing up for the patient portal called "MyChart".  Sign up information is provided on this After Visit Summary.  MyChart is used to connect with patients for Virtual Visits (Telemedicine).  Patients are able to view lab/test results, encounter notes, upcoming appointments, etc.  Non-urgent messages can be sent to your provider as well.   To learn more about what you can do with MyChart, go to [email or web address].    Your next appointment:   40months)   The format for your next appointment:   In Person  Provider:   BKate Sable MD    Other Instructions   Important Information About Sugar

## 2022-02-27 ENCOUNTER — Encounter: Payer: Self-pay | Admitting: Internal Medicine

## 2022-02-27 ENCOUNTER — Ambulatory Visit: Payer: MEDICARE

## 2022-02-27 ENCOUNTER — Ambulatory Visit (INDEPENDENT_AMBULATORY_CARE_PROVIDER_SITE_OTHER): Payer: Medicare Other | Admitting: Internal Medicine

## 2022-02-27 ENCOUNTER — Other Ambulatory Visit: Payer: Self-pay | Admitting: Internal Medicine

## 2022-02-27 ENCOUNTER — Ambulatory Visit
Admission: RE | Admit: 2022-02-27 | Discharge: 2022-02-27 | Disposition: A | Discharge status: Home / Self Care | Payer: Medicare Other | Source: Ambulatory Visit | Attending: Internal Medicine | Admitting: Internal Medicine

## 2022-02-27 ENCOUNTER — Ambulatory Visit
Admission: RE | Admit: 2022-02-27 | Discharge: 2022-02-27 | Disposition: A | Discharge status: Home / Self Care | Payer: Medicare Other | Attending: Internal Medicine | Admitting: Internal Medicine

## 2022-02-27 VITALS — BP 106/60 | HR 71 | Temp 98.1°F | Resp 16 | Ht 66.0 in | Wt 191.6 lb

## 2022-02-27 DIAGNOSIS — J9611 Chronic respiratory failure with hypoxia: Secondary | ICD-10-CM | POA: Insufficient documentation

## 2022-02-27 DIAGNOSIS — N390 Urinary tract infection, site not specified: Secondary | ICD-10-CM | POA: Diagnosis not present

## 2022-02-27 DIAGNOSIS — I5032 Chronic diastolic (congestive) heart failure: Secondary | ICD-10-CM | POA: Diagnosis not present

## 2022-02-27 DIAGNOSIS — R0602 Shortness of breath: Secondary | ICD-10-CM | POA: Diagnosis not present

## 2022-02-27 DIAGNOSIS — E1165 Type 2 diabetes mellitus with hyperglycemia: Secondary | ICD-10-CM | POA: Diagnosis not present

## 2022-02-27 DIAGNOSIS — R300 Dysuria: Secondary | ICD-10-CM | POA: Diagnosis not present

## 2022-02-27 LAB — POCT URINALYSIS DIPSTICK
Bilirubin, UA: NEGATIVE
Bilirubin, UA: NEGATIVE
Glucose, UA: POSITIVE — AB
Glucose, UA: POSITIVE — AB
Ketones, UA: NEGATIVE
Ketones, UA: NEGATIVE
Nitrite, UA: NEGATIVE
Nitrite, UA: NEGATIVE
Protein, UA: POSITIVE — AB
Protein, UA: POSITIVE — AB
Spec Grav, UA: 1.015 (ref 1.010–1.025)
Spec Grav, UA: 1.015 (ref 1.010–1.025)
Urobilinogen, UA: 0.2 E.U./dL
Urobilinogen, UA: 0.2 E.U./dL
pH, UA: 6 (ref 5.0–8.0)
pH, UA: 6 (ref 5.0–8.0)

## 2022-02-27 MED ORDER — STEGLATRO 15 MG PO TABS: 15.0000 mg | ORAL_TABLET | Freq: Every day | ORAL | 1 refills | Status: DC

## 2022-02-27 NOTE — Progress Notes (Signed)
[facility] [place], [place] [address]  Internal MEDICINE  Office Visit Note  Patient Name: Amber Holder  [id number]  [id number]  Date of Service: 02/27/2022  Chief Complaint  Patient presents with   Results    Review Labs   Quality Metric Gaps    Urine microalbumin    HPI  Pt is being seen for few concerns  1 - Oxygen does not feel enough. Drops O2 saturations to 84% on 2 L, she has not been using her CPAP machine either at night, seems confused 2 - was confused about diuretics, patient has stopped taking her Demadex which is not accurate she should have only stopped for a day and resumed and then take metolazone 2.5 Monday Wednesday Friday.  She has gained weight and lower extremity edema is also worse 3 -UTI symptoms are improving patient has finished Macrobid. 4 -diabetes is improving patient is on Steglatro 15 mg once a day  Current Medication: Outpatient Encounter Medications as of 02/27/2022  Medication Sig   acetaminophen (TYLENOL) 500 MG chewable tablet Chew 1,000 mg by mouth every 8 (eight) hours as needed for pain.   albuterol (VENTOLIN HFA) 108 (90 Base) MCG/ACT inhaler Inhale 2 puffs into the lungs every 6 (six) hours as needed for wheezing or shortness of breath.   aspirin EC 81 MG tablet Take 1 tablet (81 mg total) by mouth daily. Swallow whole.   buPROPion (WELLBUTRIN XL) 150 MG 24 hr tablet TAKE 1 TABLET BY MOUTH EVERY DAY   cholecalciferol (VITAMIN D) 400 UNITS TABS tablet Take 2,000 Units by mouth daily.    CRANBERRY PO Take 25,000 mg by mouth daily.   ergocalciferol (DRISDOL) 1.25 MG (50000 UT) capsule Take one cap q week   ertugliflozin L-PyroglutamicAc (STEGLATRO) 15 MG TABS tablet Take 15 mg by mouth daily.   ertugliflozin L-PyroglutamicAc (STEGLATRO) 15 MG TABS tablet Take 1 tablet (15 mg total) by mouth daily.   fluticasone (FLOVENT HFA) 110 MCG/ACT inhaler Inhale 2 puffs into the lungs 2 (two) times daily as needed.    Fluticasone-Umeclidin-Vilant (TRELEGY ELLIPTA) 200-62.5-25 MCG/ACT AEPB Inhale one puff daily in the morning   glucose blood (ONETOUCH VERIO) test strip BLOOD SUGAR TESTING ONCE DAILY . DX E11.65   hydrocortisone cream 0.5 % Apply topically as needed.    ibandronate (BONIVA) 150 MG tablet Take 1 tablet (150 mg total) by mouth every 30 (thirty) days.   Lancets (ONETOUCH DELICA PLUS [id number]) MISC Use  as directed twice a daily DX E11.65   Magnesium 250 MG TABS Take by mouth. Takes 1 tablet 2-3 times per week   metFORMIN (GLUCOPHAGE) 500 MG tablet Take 1 tablet (500 mg total) by mouth 2 (two) times daily with a meal. May take a half tablet.   metolazone (ZAROXOLYN) 5 MG tablet Take 0.5 tablets (2.5 mg total) by mouth daily. Take 30 mins prior to your Torsemide. (Patient taking differently: Take 5 mg by mouth daily. Take 30 mins prior to your Torsemide.)   montelukast (SINGULAIR) 10 MG tablet Take 10 mg by mouth daily as needed.   Multiple Vitamins-Minerals (MULTIVITAMIN ADULTS PO) Take 1 tablet by mouth daily.   nitrofurantoin, macrocrystal-monohydrate, (MACROBID) 100 MG capsule Take 1 capsule (100 mg total) by mouth 2 (two) times daily. Use as instructed   omeprazole (PRILOSEC) 40 MG capsule Take 1 capsule (40 mg total) by mouth daily.   potassium chloride SA (KLOR-CON M) 20 MEQ tablet Take 2 tablets (40 mEq total) by mouth  daily.   Simethicone 80 MG TABS Take 1 tablet (80 mg total) by mouth as needed (burping). As directed.   torsemide (DEMADEX) 20 MG tablet Take 1 tablet (20 mg total) by mouth daily.   No facility-administered encounter medications on file as of 02/27/2022.    Surgical History: Past Surgical History:  Procedure Laterality Date   APPENDECTOMY     cataract surgery Bilateral    CHOLECYSTECTOMY     COLONOSCOPY     COLONOSCOPY WITH PROPOFOL N/A 05/28/2015   Procedure: COLONOSCOPY WITH PROPOFOL;  Surgeon: Lollie Sails, MD;  Location: [facility] ENDOSCOPY;  Service: Endoscopy;   Laterality: N/A;   COLONOSCOPY WITH PROPOFOL N/A 10/28/2018   Procedure: COLONOSCOPY WITH PROPOFOL;  Surgeon: Lollie Sails, MD;  Location: [facility] ENDOSCOPY;  Service: Endoscopy;  Laterality: N/A;   EYE SURGERY Bilateral 2015   Cataract Extraction with IOL   LITHOTRIPSY  2015   has had many stones   Mastoidotomy  1970   ROTATOR CUFF REPAIR Right 2011   TOTAL HIP ARTHROPLASTY Right 06/14/2016   Procedure: TOTAL HIP ARTHROPLASTY;  Surgeon: Dereck Leep, MD;  Location: [facility] ORS;  Service: Orthopedics;  Laterality: Right;    Medical History: Past Medical History:  Diagnosis Date   Anxiety 01/02/2012   Arthritis    Asthma    Blockage of coronary artery of heart (HCC)    Chronic kidney disease    Kidney stones   Colon polyps    COPD (chronic obstructive pulmonary disease) (HCC)    Diabetes mellitus without complication (HCC)    GERD (gastroesophageal reflux disease)    HOH (hard of hearing)    Bilateral hearing aids   Kidney stone    Osteoporosis    Sleep apnea    Does not use C-PAP on a regular basis   Venous stasis     Family History: Family History  Problem Relation Age of Onset   Breast cancer Maternal Aunt 22   Bladder Cancer Neg Hx    Kidney cancer Neg Hx     Social History   Socioeconomic History   Marital status: Divorced    Spouse name: Not on file   Number of children: Not on file   Years of education: Not on file   Highest education level: Not on file  Occupational History   Not on file  Tobacco Use   Smoking status: Former    Packs/day: 1.00    Types: Cigarettes   Smokeless tobacco: Never   Tobacco comments:    1 pack daily//quit 2 weeks ago  Vaping Use   Vaping Use: Never used  Substance and Sexual Activity   Alcohol use: Not Currently    Comment: very rarely    Drug use: No   Sexual activity: Not Currently  Other Topics Concern   Not on file  Social History Narrative   Not on file   Social Determinants of Health   Financial Resource  Strain: Not on file  Food Insecurity: Not on file  Transportation Needs: Not on file  Physical Activity: Not on file  Stress: Not on file  Social Connections: Not on file  Intimate Partner Violence: Not on file      Review of Systems  Constitutional:  Negative for chills, fatigue and unexpected weight change.  HENT:  Positive for postnasal drip. Negative for congestion, rhinorrhea, sneezing and sore throat.   Eyes:  Negative for redness.  Respiratory:  Negative for cough, chest tightness and shortness of breath.  Cardiovascular:  Negative for chest pain and palpitations.  Gastrointestinal:  Negative for abdominal pain, constipation, diarrhea, nausea and vomiting.  Genitourinary:  Negative for dysuria and frequency.  Musculoskeletal:  Negative for arthralgias, back pain, joint swelling and neck pain.  Skin:  Negative for rash.  Neurological: Negative.  Negative for tremors and numbness.  Hematological:  Negative for adenopathy. Does not bruise/bleed easily.  Psychiatric/Behavioral:  Negative for behavioral problems (Depression), sleep disturbance and suicidal ideas. The patient is not nervous/anxious.     Vital Signs: BP 106/60   Pulse 71   Temp 98.1 F (36.7 C) Comment: 2 liters oxygen  Resp 16   Ht '5\' 6"'$  (1.676 m)   Wt 191 lb 9.6 oz (86.9 kg)   SpO2 92%   BMI 30.93 kg/m    Physical Exam Constitutional:      Appearance: Normal appearance.  HENT:     Head: Normocephalic and atraumatic.     Nose: Nose normal.     Mouth/Throat:     Mouth: Mucous membranes are moist.     Pharynx: No posterior oropharyngeal erythema.  Eyes:     Extraocular Movements: Extraocular movements intact.     Pupils: Pupils are equal, round, and reactive to light.  Cardiovascular:     Pulses: Normal pulses.     Heart sounds: Normal heart sounds.  Pulmonary:     Effort: Pulmonary effort is normal.     Breath sounds: Normal breath sounds.  Musculoskeletal:     Right lower leg: Edema  present.     Left lower leg: Edema present.  Neurological:     General: No focal deficit present.     Mental Status: She is alert.  Psychiatric:        Mood and Affect: Mood normal.        Behavior: Behavior normal.        Assessment/Plan: 1. Chronic diastolic congestive heart failure (HCC) Worsening shortness of breath edema we will get a chest x-ray patient is to continue taking Demadex 20 mg once a day and metolazone 2.5 Monday Wednesday Friday half an hour before use of torsemide - Basic Metabolic Panel (BMET)  2. Type 2 diabetes mellitus with hyperglycemia, without long-term current use of insulin (HCC) I will continue on Steglatro 15 mg once a day, will help keep in mind her risk of having fungal vaginitis - Urine Microalbumin w/creat. ratio  3. Chronic UTI Patient has finished her antibiotics Macrobid 100 mg twice a day for 2 weeks and will get urine cultures - POCT Urinalysis Dipstick  CULTURE, URINE COMPREHENSIVE   4. Chronic respiratory failure with hypoxia [facility]) An order is sent to [facility] to look into CPAP set up with oxygen and increase her oxygen to 3 L at all times update chest x-ray - DG Chest 2 View; Future     General Counseling: Jazmyne verbalizes understanding of the findings of todays visit and agrees with plan of treatment. I have discussed any further diagnostic evaluation that may be needed or ordered today. We also reviewed her medications today. she has been encouraged to call the office with any questions or concerns that should arise related to todays visit.    Orders Placed This Encounter  Procedures   DG Chest 2 View   Basic Metabolic Panel (BMET)   POCT UA - Microalbumin   POCT Urinalysis Dipstick    Meds ordered this encounter  Medications   ertugliflozin L-PyroglutamicAc (STEGLATRO) 15 MG TABS tablet  Sig: Take 1 tablet (15 mg total) by mouth daily.    Dispense:  90 tablet    Refill:  1    Total time spent:35 Minutes Time  spent includes review of chart, medications, test results, and follow up plan with the patient.   [place] Controlled Substance Database was reviewed by me.   Dr Lavera Guise Internal medicine

## 2022-03-01 ENCOUNTER — Other Ambulatory Visit: Payer: Self-pay | Admitting: Internal Medicine

## 2022-03-01 ENCOUNTER — Telehealth: Payer: Self-pay

## 2022-03-01 ENCOUNTER — Ambulatory Visit: Payer: MEDICARE

## 2022-03-01 LAB — MICROALBUMIN / CREATININE URINE RATIO
Creatinine, Urine: 78.4 mg/dL
Microalb/Creat Ratio: 54 mg/g creat — ABNORMAL HIGH (ref 0–29)
Microalbumin, Urine: 42 ug/mL

## 2022-03-01 MED ORDER — CIPROFLOXACIN HCL 500 MG PO TABS: 500.0000 mg | ORAL_TABLET | Freq: Two times a day (BID) | ORAL | 0 refills | Status: AC

## 2022-03-01 MED ORDER — DAPAGLIFLOZIN PROPANEDIOL 10 MG PO TABS: 10.0000 mg | ORAL_TABLET | Freq: Every day | ORAL | 3 refills | Status: DC

## 2022-03-01 NOTE — Telephone Encounter (Signed)
Changed to [place]

## 2022-03-01 NOTE — Telephone Encounter (Signed)
Spoke to Amber Holder and informed her that her UA culture still shows infection and that we changed the antibiotics to cipro 500 mg 1 tablet twice a day for 10 days.  Amber Holder also was requesting her xray results and I advised Amber Holder that I will have Dr Humphrey Rolls review the results and once she lets me know that I will call her to inform her of the results.  Amber Holder understood and will get started on the cipro.

## 2022-03-01 NOTE — Progress Notes (Signed)
See secure chat

## 2022-03-01 NOTE — Progress Notes (Signed)
Will discuss on next visit

## 2022-03-02 ENCOUNTER — Telehealth: Payer: Self-pay

## 2022-03-02 LAB — CULTURE, URINE COMPREHENSIVE

## 2022-03-02 NOTE — Telephone Encounter (Signed)
Spoke to pt again to ask about her fluid medication.  Pt advised she is taking the torsemide 20 mg daily.  Pt also had a question about her chest xray and per Dr Humphrey Rolls the xray was ok.  Informed pt again to make sure she gets started on the Cipro for the UTI and pt advised she would pick it up when she goes to town.

## 2022-03-03 NOTE — Telephone Encounter (Signed)
error 

## 2022-03-06 ENCOUNTER — Ambulatory Visit: Payer: Medicare Other | Admitting: Internal Medicine

## 2022-03-06 ENCOUNTER — Ambulatory Visit: Payer: MEDICARE

## 2022-03-07 DIAGNOSIS — K582 Mixed irritable bowel syndrome: Secondary | ICD-10-CM | POA: Diagnosis not present

## 2022-03-08 ENCOUNTER — Telehealth: Payer: Self-pay

## 2022-03-08 ENCOUNTER — Ambulatory Visit: Payer: MEDICARE

## 2022-03-08 ENCOUNTER — Other Ambulatory Visit: Payer: Self-pay

## 2022-03-08 ENCOUNTER — Other Ambulatory Visit
Admission: RE | Admit: 2022-03-08 | Discharge: 2022-03-08 | Disposition: A | Discharge status: Home / Self Care | Payer: Medicare Other | Attending: Internal Medicine | Admitting: Internal Medicine

## 2022-03-08 DIAGNOSIS — E876 Hypokalemia: Secondary | ICD-10-CM | POA: Insufficient documentation

## 2022-03-08 DIAGNOSIS — I5032 Chronic diastolic (congestive) heart failure: Secondary | ICD-10-CM | POA: Diagnosis present

## 2022-03-08 LAB — BASIC METABOLIC PANEL
Anion gap: 14 (ref 5–15)
BUN: 42 mg/dL — ABNORMAL HIGH (ref 8–23)
CO2: 31 mmol/L (ref 22–32)
Calcium: 9.5 mg/dL (ref 8.9–10.3)
Chloride: 91 mmol/L — ABNORMAL LOW (ref 98–111)
Creatinine, Ser: 1.23 mg/dL — ABNORMAL HIGH (ref 0.44–1.00)
GFR, Estimated: 46 mL/min — ABNORMAL LOW (ref >60)
Glucose, Bld: 219 mg/dL — ABNORMAL HIGH (ref 70–99)
Potassium: 2.4 mmol/L — CL (ref 3.5–5.1)
Sodium: 136 mmol/L (ref 135–145)

## 2022-03-08 MED ORDER — TIOTROPIUM BROMIDE MONOHYDRATE 18 MCG IN CAPS: 18.0000 ug | ORAL_CAPSULE | Freq: Every day | RESPIRATORY_TRACT | 1 refills | Status: DC

## 2022-03-08 NOTE — Telephone Encounter (Addendum)
[name] from [facility] called and advised Amber Holder has critical lab results of POTASSIUM @ 2.4 I spoke to Dr Humphrey Rolls and she advised for Amber Holder to take potassium 2 tablets in the morning and 2 tablets in the evening on today and Thursday 7/12-7/13/23 On Friday to Monday 03/10/22 to 03/13/22 Take 2 tablets in morning and 1 in the evening  [facility] also ordered lab for BMP for Amber Holder to repeat on Monday 03/13/22 Amber Holder advised she can have done before her appt with [facility] on Monday at 9:20 am  I called Amber Holder and informed her of all above information and instructions on medication doses and I also texted a picture with all instructions to patient to make sure she understood.  Amber Holder advised she understood all instructions  Amber Holder also stated that the TRELEGY inhaler she feels that her taking it is causing all the issues with her drop in potassium and high sugars,  I informed Amber Holder that I will talk with [facility] and let her know her response.  Amber Holder advised she was taking  METOLAZONE 2.5 mg on Monday, Wednesday and Friday And TORSEMIDE 20 mg once a day

## 2022-03-08 NOTE — Addendum Note (Signed)
Addended by: Edd Arbour B on: 03/08/2022 05:07 PM   Modules accepted: Orders

## 2022-03-08 NOTE — Telephone Encounter (Signed)
Spoke to pt after talking with [facility] and advised pt could stop the TRELEGY inhaler and we will send a new prescription in to pt's pharmacy for the [place] [facility] [place].  Pt advised that we ordered the lab work and we have it as STAT and pt will go early in the morning on 03/13/22 to have labs done before OV appt.  Also informed pt to take extra dose of Wellbutrin to total 300 mg.  Pt understood instructions

## 2022-03-12 ENCOUNTER — Encounter (INDEPENDENT_AMBULATORY_CARE_PROVIDER_SITE_OTHER): Payer: Self-pay | Admitting: Nurse Practitioner

## 2022-03-12 NOTE — Progress Notes (Signed)
Subjective:    Patient ID: Amber Holder, female    DOB: 11-25-1945, 76 y.o.   MRN: [id number] No chief complaint on file.   Amber Holder is a 76 year old female that presents today for evaluation of bilateral leg pain as a referral from her cardiologist Dr. Garen Lah.  The patient is a current smoker with a past history of venous insufficiency, PAD and carotid stenosis.  She notes that recently she has been having issues with walking and notes weakness in her thighs after walking a short distance.  The patient previously wore compression socks on a daily basis however recently has not been wearing them as frequently due to them being tighter and more difficult to wear.  She denies any TIA-like symptoms or amaurosis fugax.  Is been a few years since her carotids have been checked.  It was noted that there was no significant plaque on the right with less than 50% on the left.  The patient also notes having swelling in her lower extremities as well.Since her last visit on 11/17/2021 she had bilateral lower extremity arterial duplexes which showed no evidence of significant stenosis bilaterally.  She had triphasic waveforms throughout her bilateral lower extremities.  Today noninvasive studies show evidence of superficial venous reflux in the right great saphenous vein.  There is evidence of reflux also in the left great saphenous vein.  No evidence of deep venous insufficiency.  No evidence of superficial thrombophlebitis bilaterally.  The patient also underwent a carotid artery duplex.  Studies today show no evidence of stenosis in the right ICA with a 1 to 39% stenosis in the left ICA.  The bilateral vertebral arteries have antegrade flow with normal flow hemodynamics in the bilateral subclavian arteries.    Review of Systems  Cardiovascular:  Positive for leg swelling.  All other systems reviewed and are negative.      Objective:   Physical Exam Vitals reviewed.  HENT:     Head:  Normocephalic.  Cardiovascular:     Rate and Rhythm: Normal rate.  Pulmonary:     Effort: Pulmonary effort is normal.  Musculoskeletal:     Right lower leg: Edema present.     Left lower leg: Edema present.  Skin:    General: Skin is warm and dry.  Neurological:     Mental Status: She is alert and oriented to person, place, and time.  Psychiatric:        Mood and Affect: Mood normal.        Behavior: Behavior normal.        Thought Content: Thought content normal.        Judgment: Judgment normal.     BP 112/64 (BP Location: Left Arm)   Pulse 84   Resp 16   Ht '5\' 6"'$  (1.676 m)   Wt 190 lb (86.2 kg)   BMI 30.67 kg/m   Past Medical History:  Diagnosis Date   Anxiety 01/02/2012   Arthritis    Asthma    Blockage of coronary artery of heart (HCC)    Chronic kidney disease    Kidney stones   Colon polyps    COPD (chronic obstructive pulmonary disease) (HCC)    Diabetes mellitus without complication (HCC)    GERD (gastroesophageal reflux disease)    HOH (hard of hearing)    Bilateral hearing aids   Kidney stone    Osteoporosis    Sleep apnea    Does not use C-PAP on a regular basis  Venous stasis     Social History   Socioeconomic History   Marital status: Divorced    Spouse name: Not on file   Number of children: Not on file   Years of education: Not on file   Highest education level: Not on file  Occupational History   Not on file  Tobacco Use   Smoking status: Former    Packs/day: 1.00    Types: Cigarettes    Quit date: 12/28/2021    Years since quitting: 0.2    Passive exposure: Never   Smokeless tobacco: Never   Tobacco comments:    1 pack daily//quit 12/28/2021  Vaping Use   Vaping Use: Never used  Substance and Sexual Activity   Alcohol use: Not Currently    Comment: very rarely    Drug use: No   Sexual activity: Not Currently  Other Topics Concern   Not on file  Social History Narrative   Not on file   Social Determinants of Health    Financial Resource Strain: Not on file  Food Insecurity: Not on file  Transportation Needs: Not on file  Physical Activity: Not on file  Stress: Not on file  Social Connections: Not on file  Intimate Partner Violence: Not on file    Past Surgical History:  Procedure Laterality Date   APPENDECTOMY     cataract surgery Bilateral    CHOLECYSTECTOMY     COLONOSCOPY     COLONOSCOPY WITH PROPOFOL N/A 05/28/2015   Procedure: COLONOSCOPY WITH PROPOFOL;  Surgeon: Lollie Sails, MD;  Location: [facility] ENDOSCOPY;  Service: Endoscopy;  Laterality: N/A;   COLONOSCOPY WITH PROPOFOL N/A 10/28/2018   Procedure: COLONOSCOPY WITH PROPOFOL;  Surgeon: Lollie Sails, MD;  Location: [facility] ENDOSCOPY;  Service: Endoscopy;  Laterality: N/A;   EYE SURGERY Bilateral 2015   Cataract Extraction with IOL   LITHOTRIPSY  2015   has had many stones   Mastoidotomy  1970   ROTATOR CUFF REPAIR Right 2011   TOTAL HIP ARTHROPLASTY Right 06/14/2016   Procedure: TOTAL HIP ARTHROPLASTY;  Surgeon: Dereck Leep, MD;  Location: [facility] ORS;  Service: Orthopedics;  Laterality: Right;    Family History  Problem Relation Age of Onset   Breast cancer Maternal Aunt 50   Bladder Cancer Neg Hx    Kidney cancer Neg Hx     Allergies  Allergen Reactions   Iodinated Contrast Media Hives and Itching    Pt received 13hr pre-meds for a Cardiac scan on 11/16/21 and had itching and Hives develop after contrast administration   Shellfish Allergy Swelling    "shrimp" Betadine okay   Sulfa Antibiotics Hives    Difficulty breathing   Sulfasalazine Hives    Difficulty breathing   Percocet [Oxycodone-Acetaminophen] Hives    Difficulty breathing   Iodine Itching       Latest Ref Rng & Units 12/24/2020   11:54 AM 09/11/2019   10:35 AM 07/09/2018   10:04 AM  CBC  WBC 3.4 - 10.8 x10E3/uL 8.5  8.0  6.9   Hemoglobin 11.1 - 15.9 g/dL 16.1  15.0  14.4   Hematocrit 34.0 - 46.6 % 46.9  44.4  43.2   Platelets 150 - 450 x10E3/uL  238  240  244       CMP     Component Value Date/Time   NA 136 03/08/2022 1154   NA 140 12/13/2021 1004   NA 138 07/16/2013 0521   K 2.4 (LL) 03/08/2022 1154  K 3.9 07/16/2013 0521   CL 91 (L) 03/08/2022 1154   CL 108 (H) 07/16/2013 0521   CO2 31 03/08/2022 1154   CO2 25 07/16/2013 0521   GLUCOSE 219 (H) 03/08/2022 1154   GLUCOSE 102 (H) 07/16/2013 0521   BUN 42 (H) 03/08/2022 1154   BUN 28 (H) 12/13/2021 1004   BUN 13 07/16/2013 0521   CREATININE 1.23 (H) 03/08/2022 1154   CREATININE 0.71 07/16/2013 0521   CALCIUM 9.5 03/08/2022 1154   CALCIUM 7.8 (L) 07/16/2013 0521   PROT 6.7 06/13/2021 1406   ALBUMIN 4.4 06/13/2021 1406   AST 18 06/13/2021 1406   ALT 16 06/13/2021 1406   ALKPHOS 69 06/13/2021 1406   BILITOT 0.5 06/13/2021 1406   GFRNONAA 46 (L) 03/08/2022 1154   GFRNONAA >60 07/16/2013 0521   GFRAA 104 09/11/2019 1035   GFRAA >60 07/16/2013 0521     No results found.     Assessment & Plan:   1. Peripheral edema The patient does have evidence of bilateral venous reflux.  We discussed conservative versus invasive procedures.  We also discussed the use of a lymphedema pump.  The patient will continue with conservative measures for the next 3 months we will have her return to evaluate her pain and discomfort to determine if it may be beneficial to have her undergo endovenous laser ablation.  Patient is advised to utilize medical grade compression stockings for conservative therapy including elevation and activity.  2. Type 2 diabetes mellitus with hyperglycemia, without long-term current use of insulin (HCC) Continue hypoglycemic medications as already ordered, these medications have been reviewed and there are no changes at this time.  Hgb A1C to be monitored as already arranged by primary service   3. Stenosis of left carotid artery Recommend:  Given the patient's asymptomatic subcritical stenosis no further invasive testing or surgery at this time.  Duplex  ultrasound shows <50% stenosis bilaterally.  Continue antiplatelet therapy as prescribed Continue management of CAD, HTN and Hyperlipidemia Healthy heart diet,  encouraged exercise at least 4 times per week Follow up in 12 months with duplex ultrasound and physical exam     Current Outpatient Medications on File Prior to Visit  Medication Sig Dispense Refill   acetaminophen (TYLENOL) 500 MG chewable tablet Chew 1,000 mg by mouth every 8 (eight) hours as needed for pain.     albuterol (VENTOLIN HFA) 108 (90 Base) MCG/ACT inhaler Inhale 2 puffs into the lungs every 6 (six) hours as needed for wheezing or shortness of breath. 18 g 2   aspirin EC 81 MG tablet Take 1 tablet (81 mg total) by mouth daily. Swallow whole. 90 tablet 3   buPROPion (WELLBUTRIN XL) 150 MG 24 hr tablet TAKE 1 TABLET BY MOUTH EVERY DAY 90 tablet 2   cholecalciferol (VITAMIN D) 400 UNITS TABS tablet Take 2,000 Units by mouth daily.      CRANBERRY PO Take 25,000 mg by mouth daily.     ergocalciferol (DRISDOL) 1.25 MG (50000 UT) capsule Take one cap q week 12 capsule 3   fluticasone (FLOVENT HFA) 110 MCG/ACT inhaler Inhale 2 puffs into the lungs 2 (two) times daily as needed.     glucose blood (ONETOUCH VERIO) test strip BLOOD SUGAR TESTING ONCE DAILY . DX E11.65 100 strip 3   hydrocortisone cream 0.5 % Apply topically as needed.      ibandronate (BONIVA) 150 MG tablet Take 1 tablet (150 mg total) by mouth every 30 (thirty) days. 3 tablet  3   Lancets (ONETOUCH DELICA PLUS PRXYVO59Y) MISC Use  as directed twice a daily DX E11.65 100 each 3   Magnesium 250 MG TABS Take by mouth. Takes 1 tablet 2-3 times per week     metFORMIN (GLUCOPHAGE) 500 MG tablet Take 1 tablet (500 mg total) by mouth 2 (two) times daily with a meal. May take a half tablet. 180 tablet 1   metolazone (ZAROXOLYN) 5 MG tablet Take 0.5 tablets (2.5 mg total) by mouth daily. Take 30 mins prior to your Torsemide. (Patient taking differently: Take 5 mg by mouth  daily. Take 30 mins prior to your Torsemide.) 30 tablet 1   montelukast (SINGULAIR) 10 MG tablet Take 10 mg by mouth daily as needed.     Multiple Vitamins-Minerals (MULTIVITAMIN ADULTS PO) Take 1 tablet by mouth daily.     nitrofurantoin, macrocrystal-monohydrate, (MACROBID) 100 MG capsule Take 1 capsule (100 mg total) by mouth 2 (two) times daily. Use as instructed 20 capsule 0   omeprazole (PRILOSEC) 40 MG capsule Take 1 capsule (40 mg total) by mouth daily.     potassium chloride SA (KLOR-CON M) 20 MEQ tablet Take 2 tablets (40 mEq total) by mouth daily. 60 tablet 2   Simethicone 80 MG TABS Take 1 tablet (80 mg total) by mouth as needed (burping). As directed.  0   torsemide (DEMADEX) 20 MG tablet Take 1 tablet (20 mg total) by mouth daily. 30 tablet 1   No current facility-administered medications on file prior to visit.    There are no Patient Instructions on file for this visit. No follow-ups on file.   Kris Hartmann, NP

## 2022-03-13 ENCOUNTER — Ambulatory Visit (INDEPENDENT_AMBULATORY_CARE_PROVIDER_SITE_OTHER): Payer: Medicare Other | Admitting: Internal Medicine

## 2022-03-13 ENCOUNTER — Ambulatory Visit: Payer: MEDICARE

## 2022-03-13 ENCOUNTER — Encounter: Payer: Self-pay | Admitting: Internal Medicine

## 2022-03-13 ENCOUNTER — Other Ambulatory Visit
Admission: RE | Admit: 2022-03-13 | Discharge: 2022-03-13 | Disposition: A | Discharge status: Home / Self Care | Payer: Medicare Other | Attending: Internal Medicine | Admitting: Internal Medicine

## 2022-03-13 VITALS — BP 115/56 | HR 72 | Temp 98.4°F | Resp 16 | Ht 66.0 in | Wt 188.0 lb

## 2022-03-13 DIAGNOSIS — E876 Hypokalemia: Secondary | ICD-10-CM | POA: Diagnosis not present

## 2022-03-13 DIAGNOSIS — J9611 Chronic respiratory failure with hypoxia: Secondary | ICD-10-CM | POA: Diagnosis not present

## 2022-03-13 DIAGNOSIS — N390 Urinary tract infection, site not specified: Secondary | ICD-10-CM

## 2022-03-13 DIAGNOSIS — B3731 Acute candidiasis of vulva and vagina: Secondary | ICD-10-CM | POA: Diagnosis not present

## 2022-03-13 DIAGNOSIS — I5032 Chronic diastolic (congestive) heart failure: Secondary | ICD-10-CM | POA: Diagnosis not present

## 2022-03-13 DIAGNOSIS — F411 Generalized anxiety disorder: Secondary | ICD-10-CM

## 2022-03-13 LAB — BASIC METABOLIC PANEL
Anion gap: 10 (ref 5–15)
BUN: 37 mg/dL — ABNORMAL HIGH (ref 8–23)
CO2: 25 mmol/L (ref 22–32)
Calcium: 9 mg/dL (ref 8.9–10.3)
Chloride: 100 mmol/L (ref 98–111)
Creatinine, Ser: 1.08 mg/dL — ABNORMAL HIGH (ref 0.44–1.00)
GFR, Estimated: 53 mL/min — ABNORMAL LOW (ref >60)
Glucose, Bld: 151 mg/dL — ABNORMAL HIGH (ref 70–99)
Potassium: 3.3 mmol/L — ABNORMAL LOW (ref 3.5–5.1)
Sodium: 135 mmol/L (ref 135–145)

## 2022-03-13 MED ORDER — ALPRAZOLAM 0.25 MG PO TABS: ORAL_TABLET | ORAL | 1 refills | Status: DC

## 2022-03-13 MED ORDER — FLUCONAZOLE 150 MG PO TABS: ORAL_TABLET | ORAL | 0 refills | Status: DC

## 2022-03-13 MED ORDER — POTASSIUM CHLORIDE CRYS ER 20 MEQ PO TBCR: EXTENDED_RELEASE_TABLET | ORAL | 2 refills | Status: DC

## 2022-03-13 NOTE — Progress Notes (Signed)
[facility] [place], [place] [address]  Internal MEDICINE  Office Visit Note  Patient Name: Amber Holder  [id number]  [id number]  Date of Service: 03/13/2022  Chief Complaint  Patient presents with   Follow-up   Diabetes   Anxiety    HPI Patient is here for routine follow-up and surveillance of her COPD with acute exacerbation diastolic congestive heart failure, patient has been on torsemide 20 mg once a day along with metolazone 2.5 Monday Wednesday Friday and a potassium supplement 40 to 60 mEq every day. Labs showed evidence of overdiuresis with elevated BUN and creatinine and low potassium. Her glucose has also been slightly elevated patient seems very anxious about it. Patient has chronic hypoxemic respiratory failure and has been on 2 to 3 L of oxygen with ambulation. Patient also has side effects intolerance to Trelegy with dryness in her mouth, it was switched to Spiriva. Patient also complains of vaginal irritation and rash she has been on [place] She was also seen by vascular and was told might need procedure for varicose veins continues to have bilateral lower extremity edema, she is wearing compression stockings today  Current Medication: Outpatient Encounter Medications as of 03/13/2022  Medication Sig   acetaminophen (TYLENOL) 500 MG chewable tablet Chew 1,000 mg by mouth every 8 (eight) hours as needed for pain.   albuterol (VENTOLIN HFA) 108 (90 Base) MCG/ACT inhaler Inhale 2 puffs into the lungs every 6 (six) hours as needed for wheezing or shortness of breath.   ALPRAZolam (XANAX) 0.25 MG tablet Take one tab po bid for anxiety prn   aspirin EC 81 MG tablet Take 1 tablet (81 mg total) by mouth daily. Swallow whole.   buPROPion (WELLBUTRIN XL) 150 MG 24 hr tablet TAKE 1 TABLET BY MOUTH EVERY DAY   cholecalciferol (VITAMIN D) 400 UNITS TABS tablet Take 2,000 Units by mouth daily.    CRANBERRY PO Take 25,000 mg by mouth daily.   dapagliflozin  propanediol (FARXIGA) 10 MG TABS tablet Take 1 tablet (10 mg total) by mouth daily before breakfast.   ergocalciferol (DRISDOL) 1.25 MG (50000 UT) capsule Take one cap q week   fluconazole (DIFLUCAN) 150 MG tablet Take one tab po q week for fungal infection   fluticasone (FLOVENT HFA) 110 MCG/ACT inhaler Inhale 2 puffs into the lungs 2 (two) times daily as needed.   glucose blood (ONETOUCH VERIO) test strip BLOOD SUGAR TESTING ONCE DAILY . DX E11.65   hydrocortisone cream 0.5 % Apply topically as needed.    ibandronate (BONIVA) 150 MG tablet Take 1 tablet (150 mg total) by mouth every 30 (thirty) days.   Lancets (ONETOUCH DELICA PLUS IZTIWP80D) MISC Use  as directed twice a daily DX E11.65   Magnesium 250 MG TABS Take by mouth. Takes 1 tablet 2-3 times per week   metFORMIN (GLUCOPHAGE) 500 MG tablet Take 1 tablet (500 mg total) by mouth 2 (two) times daily with a meal. May take a half tablet.   metolazone (ZAROXOLYN) 5 MG tablet Take 0.5 tablets (2.5 mg total) by mouth daily. Take 30 mins prior to your Torsemide. (Patient taking differently: Take 5 mg by mouth daily. Take 30 mins prior to your Torsemide.)   montelukast (SINGULAIR) 10 MG tablet Take 10 mg by mouth daily as needed.   Multiple Vitamins-Minerals (MULTIVITAMIN ADULTS PO) Take 1 tablet by mouth daily.   nitrofurantoin, macrocrystal-monohydrate, (MACROBID) 100 MG capsule Take 1 capsule (100 mg total) by mouth 2 (two) times daily.  Use as instructed   omeprazole (PRILOSEC) 40 MG capsule Take 1 capsule (40 mg total) by mouth daily.   OXYGEN Inhale 3 L into the lungs. Pt uses [facility] for Oxygen   potassium chloride SA (KLOR-CON M) 20 MEQ tablet Take 2 tablets (40 mEq total) by mouth daily.   Simethicone 80 MG TABS Take 1 tablet (80 mg total) by mouth as needed (burping). As directed.   tiotropium (SPIRIVA) 18 MCG inhalation capsule Place 1 capsule (18 mcg total) into inhaler and inhale daily.   torsemide (DEMADEX) 20 MG tablet Take  1 tablet (20 mg total) by mouth daily.   No facility-administered encounter medications on file as of 03/13/2022.    Surgical History: Past Surgical History:  Procedure Laterality Date   APPENDECTOMY     cataract surgery Bilateral    CHOLECYSTECTOMY     COLONOSCOPY     COLONOSCOPY WITH PROPOFOL N/A 05/28/2015   Procedure: COLONOSCOPY WITH PROPOFOL;  Surgeon: Lollie Sails, MD;  Location: [facility] ENDOSCOPY;  Service: Endoscopy;  Laterality: N/A;   COLONOSCOPY WITH PROPOFOL N/A 10/28/2018   Procedure: COLONOSCOPY WITH PROPOFOL;  Surgeon: Lollie Sails, MD;  Location: [facility] ENDOSCOPY;  Service: Endoscopy;  Laterality: N/A;   EYE SURGERY Bilateral 2015   Cataract Extraction with IOL   LITHOTRIPSY  2015   has had many stones   Mastoidotomy  1970   ROTATOR CUFF REPAIR Right 2011   TOTAL HIP ARTHROPLASTY Right 06/14/2016   Procedure: TOTAL HIP ARTHROPLASTY;  Surgeon: Dereck Leep, MD;  Location: [facility] ORS;  Service: Orthopedics;  Laterality: Right;    Medical History: Past Medical History:  Diagnosis Date   Anxiety 01/02/2012   Arthritis    Asthma    Blockage of coronary artery of heart (HCC)    Chronic kidney disease    Kidney stones   Colon polyps    COPD (chronic obstructive pulmonary disease) (HCC)    Diabetes mellitus without complication (HCC)    GERD (gastroesophageal reflux disease)    HOH (hard of hearing)    Bilateral hearing aids   Kidney stone    Osteoporosis    Sleep apnea    Does not use C-PAP on a regular basis   Venous stasis     Family History: Family History  Problem Relation Age of Onset   Breast cancer Maternal Aunt 38   Bladder Cancer Neg Hx    Kidney cancer Neg Hx     Social History   Socioeconomic History   Marital status: Divorced    Spouse name: Not on file   Number of children: Not on file   Years of education: Not on file   Highest education level: Not on file  Occupational History   Not on file  Tobacco Use   Smoking status:  Former    Packs/day: 1.00    Types: Cigarettes    Quit date: 12/28/2021    Years since quitting: 0.2    Passive exposure: Never   Smokeless tobacco: Never   Tobacco comments:    1 pack daily//quit 12/28/2021  Vaping Use   Vaping Use: Never used  Substance and Sexual Activity   Alcohol use: Not Currently    Comment: very rarely    Drug use: No   Sexual activity: Not Currently  Other Topics Concern   Not on file  Social History Narrative   Not on file   Social Determinants of Health   Financial Resource Strain: Not on file  Food Insecurity: Not on file  Transportation Needs: Not on file  Physical Activity: Not on file  Stress: Not on file  Social Connections: Not on file  Intimate Partner Violence: Not on file      Review of Systems  Constitutional:  Positive for fatigue. Negative for chills and unexpected weight change.  HENT: Negative.  Negative for congestion, postnasal drip, rhinorrhea, sneezing and sore throat.   Eyes:  Negative for redness.  Respiratory:  Negative for cough, chest tightness, shortness of breath and stridor.   Cardiovascular:  Positive for leg swelling. Negative for chest pain and palpitations.       Dry mouth  Gastrointestinal:  Negative for abdominal pain, constipation, diarrhea, nausea and vomiting.  Genitourinary:  Negative for dysuria and frequency.  Musculoskeletal:  Negative for arthralgias, back pain, joint swelling and neck pain.  Skin:  Positive for rash.       Perivaginal area  Neurological: Negative.  Negative for tremors and numbness.  Hematological:  Negative for adenopathy. Does not bruise/bleed easily.  Psychiatric/Behavioral:  Negative for behavioral problems (Depression), sleep disturbance and suicidal ideas. The patient is not nervous/anxious.     Vital Signs: BP (!) 115/56   Pulse 72   Temp 98.4 F (36.9 C)   Resp 16   Ht '5\' 6"'$  (1.676 m)   Wt 188 lb (85.3 kg)   SpO2 95%   BMI 30.34 kg/m    Physical  Exam Constitutional:      Appearance: Normal appearance.  HENT:     Head: Normocephalic and atraumatic.     Nose: Nose normal.     Mouth/Throat:     Mouth: Mucous membranes are moist.     Pharynx: No posterior oropharyngeal erythema.  Eyes:     Extraocular Movements: Extraocular movements intact.     Pupils: Pupils are equal, round, and reactive to light.  Cardiovascular:     Pulses: Normal pulses.     Heart sounds: Normal heart sounds.  Pulmonary:     Effort: Pulmonary effort is normal.     Breath sounds: Normal breath sounds.  Musculoskeletal:     Right lower leg: Edema present.     Left lower leg: Edema present.     Right foot: Normal range of motion.     Left foot: Normal range of motion.  Feet:     Right foot:     Protective Sensation: 2 sites tested.  2 sites sensed.     Skin integrity: Skin integrity normal.     Toenail Condition: Right toenails are normal.     Left foot:     Protective Sensation: 2 sites tested.  2 sites sensed.     Skin integrity: Skin integrity normal.     Toenail Condition: Left toenails are normal.  Neurological:     General: No focal deficit present.     Mental Status: She is alert.  Psychiatric:        Mood and Affect: Mood normal.        Behavior: Behavior normal.        Assessment/Plan: 1. Chronic diastolic congestive heart failure (HCC) Continue Metolazone 2.5 mg Monday Wednesday Friday, will take torsemide rest of the week ( 4 days)  continue take potassium total 60 mill mEq every day we will get a basic metabolic panel done next week, all routine instructions were given to the patient to avoid confusion, avoid overdiuresis since the patient BUN/cr  Education provided about the bilateral lower extremity edema  as a result of chronic venous stasis due to varicose veins patient is followed by vascular. Encouraged to continue to wear compression stockings, increase activity as tolerated - Basic Metabolic Panel (BMET)  2. Chronic  respiratory failure with hypoxia (HCC) We will continue oxygen as before patient will also see pulmonary we will also order PFTs to stop Trelegy due to side effects restart his Spiriva - Pulmonary Function Test; Future  3. Candida vaginitis Patient is on Farxiga Also on steroid inhaler we will give Diflucan as prescribed - fluconazole (DIFLUCAN) 150 MG tablet; Take one tab po q week for fungal infection  Dispense: 4 tablet; Refill: 0  4. Chronic UTI Improved UTI symptoms we will repeat urine analysis and culture and sensitivities on next visit  5. Hypokalemia Patient is continued to potassium 40 mEq in the morning 20 in the evening and may take extra as per MD instruction  6. GAD (generalized anxiety disorder) Continue Wellbutrin patient seems to be anxious about her condition and not feeling better quickly, reassurance was given to the patient that this is a slow process - ALPRAZolam (XANAX) 0.25 MG tablet; Take one tab po bid for anxiety prn  Dispense: 30 tablet; Refill: 1   General Counseling: Katoya verbalizes understanding of the findings of todays visit and agrees with plan of treatment. I have discussed any further diagnostic evaluation that may be needed or ordered today. We also reviewed her medications today. she has been encouraged to call the office with any questions or concerns that should arise related to todays visit.    Orders Placed This Encounter  Procedures   Pulmonary Function Test    Meds ordered this encounter  Medications   ALPRAZolam (XANAX) 0.25 MG tablet    Sig: Take one tab po bid for anxiety prn    Dispense:  30 tablet    Refill:  1   fluconazole (DIFLUCAN) 150 MG tablet    Sig: Take one tab po q week for fungal infection    Dispense:  4 tablet    Refill:  0    Total time spent:38 Minutes Time spent includes review of chart, medications, test results, and follow up plan with the patient.   [place] Controlled Substance Database was reviewed by  me.   Dr Lavera Guise Internal medicine

## 2022-03-13 NOTE — Progress Notes (Signed)
Will be reviewed with pt today

## 2022-03-15 ENCOUNTER — Ambulatory Visit: Payer: MEDICARE

## 2022-03-16 ENCOUNTER — Encounter: Payer: Self-pay | Admitting: Internal Medicine

## 2022-03-20 ENCOUNTER — Ambulatory Visit: Payer: Medicare Other

## 2022-03-21 ENCOUNTER — Other Ambulatory Visit
Admission: RE | Admit: 2022-03-21 | Discharge: 2022-03-21 | Disposition: A | Discharge status: Home / Self Care | Payer: Medicare Other | Attending: Internal Medicine | Admitting: Internal Medicine

## 2022-03-21 DIAGNOSIS — I5032 Chronic diastolic (congestive) heart failure: Secondary | ICD-10-CM | POA: Insufficient documentation

## 2022-03-21 LAB — BASIC METABOLIC PANEL
Anion gap: 9 (ref 5–15)
BUN: 24 mg/dL — ABNORMAL HIGH (ref 8–23)
CO2: 23 mmol/L (ref 22–32)
Calcium: 9.2 mg/dL (ref 8.9–10.3)
Chloride: 107 mmol/L (ref 98–111)
Creatinine, Ser: 0.85 mg/dL (ref 0.44–1.00)
GFR, Estimated: >60 mL/min (ref >60)
Glucose, Bld: 121 mg/dL — ABNORMAL HIGH (ref 70–99)
Potassium: 4.4 mmol/L (ref 3.5–5.1)
Sodium: 139 mmol/L (ref 135–145)

## 2022-03-22 ENCOUNTER — Ambulatory Visit: Payer: Medicare Other

## 2022-03-27 ENCOUNTER — Encounter: Payer: Self-pay | Admitting: Internal Medicine

## 2022-03-27 ENCOUNTER — Ambulatory Visit: Payer: Medicare Other

## 2022-03-27 ENCOUNTER — Ambulatory Visit (INDEPENDENT_AMBULATORY_CARE_PROVIDER_SITE_OTHER): Payer: Medicare Other | Admitting: Internal Medicine

## 2022-03-27 VITALS — BP 115/59 | HR 72 | Temp 98.3°F | Resp 16 | Ht 66.0 in | Wt 189.0 lb

## 2022-03-27 DIAGNOSIS — I5032 Chronic diastolic (congestive) heart failure: Secondary | ICD-10-CM | POA: Diagnosis not present

## 2022-03-27 DIAGNOSIS — J449 Chronic obstructive pulmonary disease, unspecified: Secondary | ICD-10-CM

## 2022-03-27 DIAGNOSIS — J9611 Chronic respiratory failure with hypoxia: Secondary | ICD-10-CM

## 2022-03-27 DIAGNOSIS — N390 Urinary tract infection, site not specified: Secondary | ICD-10-CM

## 2022-03-27 DIAGNOSIS — E782 Mixed hyperlipidemia: Secondary | ICD-10-CM

## 2022-03-27 LAB — POCT URINALYSIS DIPSTICK
Bilirubin, UA: NEGATIVE
Blood, UA: NEGATIVE
Glucose, UA: POSITIVE — AB
Ketones, UA: NEGATIVE
Leukocytes, UA: NEGATIVE
Nitrite, UA: NEGATIVE
Protein, UA: NEGATIVE
Spec Grav, UA: 1.01 (ref 1.010–1.025)
Urobilinogen, UA: 0.2 E.U./dL
pH, UA: 6 (ref 5.0–8.0)

## 2022-03-27 MED ORDER — ATORVASTATIN CALCIUM 20 MG PO TABS: 20.0000 mg | ORAL_TABLET | Freq: Every day | ORAL | 3 refills | Status: DC

## 2022-03-27 NOTE — Progress Notes (Signed)
[facility] [place], [place] [address]  Internal MEDICINE  Office Visit Note  Patient Name: Amber Holder  [id number]  [id number]  Date of Service: 03/28/2022  Chief Complaint  Patient presents with   Follow-up   Recurrent UTI    Pt would like to check progress of UTI, having pain on left side of lower back - Urine has been dipped and entered   Diabetes   Gastroesophageal Reflux    HPI  Patient is seen for routine follow-up for chronic medical problems. Today patient said that she is feeling better, feels like UTI is resolved or resolving, has given urine specimen already this morning. Breathing is also improving, has been more active at home, she is on oxygen 3 L with ambulation and is using Spiriva every morning. Stable chronic diastolic heart failure patient is being managed on metolazone 2.5 mg Monday Wednesday Friday and Demadex 20 mg on rest of the days, patient was taking 60 meq of KCl every day Her anxiety is also better Doing well on Farxiga as well. Labs reviewed bun and potassium improving   Current Medication: Outpatient Encounter Medications as of 03/27/2022  Medication Sig   acetaminophen (TYLENOL) 500 MG chewable tablet Chew 1,000 mg by mouth every 8 (eight) hours as needed for pain.   albuterol (VENTOLIN HFA) 108 (90 Base) MCG/ACT inhaler Inhale 2 puffs into the lungs every 6 (six) hours as needed for wheezing or shortness of breath.   ALPRAZolam (XANAX) 0.25 MG tablet Take one tab po bid for anxiety prn   aspirin EC 81 MG tablet Take 1 tablet (81 mg total) by mouth daily. Swallow whole.   atorvastatin (LIPITOR) 20 MG tablet Take 1 tablet (20 mg total) by mouth daily.   buPROPion (WELLBUTRIN XL) 150 MG 24 hr tablet TAKE 1 TABLET BY MOUTH EVERY DAY   cholecalciferol (VITAMIN D) 400 UNITS TABS tablet Take 2,000 Units by mouth daily.    CRANBERRY PO Take 25,000 mg by mouth daily.   dapagliflozin propanediol (FARXIGA) 10 MG TABS tablet Take  1 tablet (10 mg total) by mouth daily before breakfast.   ergocalciferol (DRISDOL) 1.25 MG (50000 UT) capsule Take one cap q week   fluconazole (DIFLUCAN) 150 MG tablet Take one tab po q week for fungal infection   fluticasone (FLOVENT HFA) 110 MCG/ACT inhaler Inhale 2 puffs into the lungs 2 (two) times daily as needed.   glucose blood (ONETOUCH VERIO) test strip BLOOD SUGAR TESTING ONCE DAILY . DX E11.65   hydrocortisone cream 0.5 % Apply topically as needed.    ibandronate (BONIVA) 150 MG tablet Take 1 tablet (150 mg total) by mouth every 30 (thirty) days.   Lancets (ONETOUCH DELICA PLUS ZTIWPY09X) MISC Use  as directed twice a daily DX E11.65   Magnesium 250 MG TABS Take by mouth. Takes 1 tablet 2-3 times per week   montelukast (SINGULAIR) 10 MG tablet Take 10 mg by mouth daily as needed.   Multiple Vitamins-Minerals (MULTIVITAMIN ADULTS PO) Take 1 tablet by mouth daily.   nitrofurantoin, macrocrystal-monohydrate, (MACROBID) 100 MG capsule Take 1 capsule (100 mg total) by mouth 2 (two) times daily. Use as instructed   omeprazole (PRILOSEC) 40 MG capsule Take 1 capsule (40 mg total) by mouth daily.   OXYGEN Inhale 3 L into the lungs. Pt uses [facility] for Oxygen   potassium chloride SA (KLOR-CON M) 20 MEQ tablet TAKE 2 TABS IN AM AND ONE TAB AT [place]  TAKE ONE EXTRA PER MD DIRECTION   Simethicone 80 MG TABS Take 1 tablet (80 mg total) by mouth as needed (burping). As directed.   tiotropium (SPIRIVA) 18 MCG inhalation capsule Place 1 capsule (18 mcg total) into inhaler and inhale daily.   torsemide (DEMADEX) 20 MG tablet Take 1 tablet (20 mg total) by mouth daily.   metolazone (ZAROXOLYN) 5 MG tablet Take 0.5 tablets (2.5 mg total) by mouth daily. Take 30 mins prior to your Torsemide. (Patient taking differently: Take 5 mg by mouth daily. Take 30 mins prior to your Torsemide.)   No facility-administered encounter medications on file as of 03/27/2022.    Surgical  History: Past Surgical History:  Procedure Laterality Date   APPENDECTOMY     cataract surgery Bilateral    CHOLECYSTECTOMY     COLONOSCOPY     COLONOSCOPY WITH PROPOFOL N/A 05/28/2015   Procedure: COLONOSCOPY WITH PROPOFOL;  Surgeon: Lollie Sails, MD;  [facility] ENDOSCOPY;  Service: Endoscopy;  Laterality: N/A;   COLONOSCOPY WITH PROPOFOL N/A 10/28/2018   Procedure: COLONOSCOPY WITH PROPOFOL;  Surgeon: Lollie Sails, MD;  Location: [facility] ENDOSCOPY;  Service: Endoscopy;  Laterality: N/A;   EYE SURGERY Bilateral 2015   Cataract Extraction with IOL   LITHOTRIPSY  2015   has had many stones   Mastoidotomy  1970   ROTATOR CUFF REPAIR Right 2011   TOTAL HIP ARTHROPLASTY Right 06/14/2016   Procedure: TOTAL HIP ARTHROPLASTY;  Surgeon: Dereck Leep, MD;  Location: [facility] ORS;  Service: Orthopedics;  Laterality: Right;    Medical History: Past Medical History:  Diagnosis Date   Anxiety 01/02/2012   Arthritis    Asthma    Blockage of coronary artery of heart (HCC)    Chronic kidney disease    Kidney stones   Colon polyps    COPD (chronic obstructive pulmonary disease) (HCC)    Diabetes mellitus without complication (HCC)    GERD (gastroesophageal reflux disease)    HOH (hard of hearing)    Bilateral hearing aids   Kidney stone    Osteoporosis    Sleep apnea    Does not use C-PAP on a regular basis   Venous stasis     Family History: Family History  Problem Relation Age of Onset   Breast cancer Maternal Aunt 41   Bladder Cancer Neg Hx    Kidney cancer Neg Hx     Social History   Socioeconomic History   Marital status: Divorced    Spouse name: Not on file   Number of children: Not on file   Years of education: Not on file   Highest education level: Not on file  Occupational History   Not on file  Tobacco Use   Smoking status: Former    Packs/day: 1.00    Types: Cigarettes    Quit date: 12/28/2021    Years since quitting: 0.2    Passive exposure: Never    Smokeless tobacco: Never   Tobacco comments:    1 pack daily//quit 12/28/2021  Vaping Use   Vaping Use: Never used  Substance and Sexual Activity   Alcohol use: Not Currently    Comment: very rarely    Drug use: No   Sexual activity: Not Currently  Other Topics Concern   Not on file  Social History Narrative   Not on file   Social Determinants of Health   Financial Resource Strain: Not on file  Food Insecurity: Not on file  Transportation Needs:  Not on file  Physical Activity: Not on file  Stress: Not on file  Social Connections: Not on file  Intimate Partner Violence: Not on file      Review of Systems  Constitutional:  Negative for fatigue and fever.  HENT:  Negative for congestion, mouth sores and postnasal drip.   Respiratory:  Negative for cough.   Cardiovascular:  Negative for chest pain.  Genitourinary:  Negative for flank pain.  Psychiatric/Behavioral: Negative.      Vital Signs: BP (!) 115/59   Pulse 72   Temp 98.3 F (36.8 C)   Resp 16   Ht '5\' 6"'$  (1.676 m)   Wt 189 lb (85.7 kg)   SpO2 95%   BMI 30.51 kg/m    Physical Exam Constitutional:      Appearance: Normal appearance.  HENT:     Head: Normocephalic and atraumatic.     Nose: Nose normal.     Mouth/Throat:     Mouth: Mucous membranes are moist.     Pharynx: No posterior oropharyngeal erythema.  Eyes:     Extraocular Movements: Extraocular movements intact.     Pupils: Pupils are equal, round, and reactive to light.  Cardiovascular:     Pulses: Normal pulses.     Heart sounds: Normal heart sounds.  Pulmonary:     Effort: Pulmonary effort is normal.     Breath sounds: Normal breath sounds.  Musculoskeletal:     Right lower leg: Edema present.     Left lower leg: Edema present.     Comments: Legs are wrapped   Neurological:     General: No focal deficit present.     Mental Status: She is alert.  Psychiatric:        Mood and Affect: Mood normal.        Behavior: Behavior normal.         Assessment/Plan: 1. Chronic UTI Symptomatic improvement we will repeat urine to make sure this has resolved - POCT Urinalysis Dipstick  2. Chronic respiratory failure with hypoxia (HCC) Continue to be on oxygen, ambulation is better we will get benefit from pulmonary rehab after PFTs are done - Basic Metabolic Panel (BMET) - Pulmonary rehab therapeutic exercise; Future  3. Chronic diastolic congestive heart failure ([place]) Improving patient does have residual bilateral lower extremity edema which is probably due to venous stasis patient has appointment with vascular avoid overdiuresis due to this reason, patient did show signs of hypovolemia and dehydration with elevated BUN and creatinine  4. Mixed hyperlipidemia Patient has been given 2 different doses of atorvastatin 1 is done and the other 1 is 40 patient would like to be on 20 mg for now - atorvastatin (LIPITOR) 20 MG tablet; Take 1 tablet (20 mg total) by mouth daily.  Dispense: 90 tablet; Refill: 3  5. Chronic obstructive pulmonary disease, unspecified COPD type ([place]) This is stable we will continue on Spiriva patient will get benefit from pulmonary rehab after PFTs are done - Pulmonary rehab therapeutic exercise; Future   General Counseling: Lorelei verbalizes understanding of the findings of todays visit and agrees with plan of treatment. I have discussed any further diagnostic evaluation that may be needed or ordered today. We also reviewed her medications today. she has been encouraged to call the office with any questions or concerns that should arise related to todays visit.    Orders Placed This Encounter  Procedures   Basic Metabolic Panel (BMET)   Pulmonary rehab therapeutic exercise  POCT Urinalysis Dipstick    Meds ordered this encounter  Medications   atorvastatin (LIPITOR) 20 MG tablet    Sig: Take 1 tablet (20 mg total) by mouth daily.    Dispense:  90 tablet    Refill:  3    Please hold for pt,  change in her dose    Total time spent:35 Minutes Time spent includes review of chart, medications, test results, and follow up plan with the patient.   [place] Controlled Substance Database was reviewed by me.   Dr Lavera Guise Internal medicine

## 2022-03-29 ENCOUNTER — Ambulatory Visit: Payer: Medicare Other

## 2022-03-31 DIAGNOSIS — Z822 Family history of deafness and hearing loss: Secondary | ICD-10-CM | POA: Diagnosis not present

## 2022-03-31 DIAGNOSIS — H903 Sensorineural hearing loss, bilateral: Secondary | ICD-10-CM | POA: Diagnosis not present

## 2022-04-03 ENCOUNTER — Ambulatory Visit: Payer: Medicare Other

## 2022-04-05 ENCOUNTER — Ambulatory Visit (INDEPENDENT_AMBULATORY_CARE_PROVIDER_SITE_OTHER): Payer: Medicare Other | Admitting: Internal Medicine

## 2022-04-05 ENCOUNTER — Ambulatory Visit: Payer: Medicare Other

## 2022-04-05 DIAGNOSIS — R0602 Shortness of breath: Secondary | ICD-10-CM

## 2022-04-05 DIAGNOSIS — J9611 Chronic respiratory failure with hypoxia: Secondary | ICD-10-CM

## 2022-04-10 ENCOUNTER — Ambulatory Visit: Payer: Medicare Other

## 2022-04-12 ENCOUNTER — Ambulatory Visit: Payer: Medicare Other

## 2022-04-12 ENCOUNTER — Ambulatory Visit: Payer: Medicare Other | Admitting: Nurse Practitioner

## 2022-04-13 ENCOUNTER — Ambulatory Visit: Payer: Medicare Other

## 2022-04-16 NOTE — Procedures (Signed)
[facility] [address] [place], [address]    Complete Pulmonary Function Testing Interpretation:  FINDINGS:  Forced vital capacity is normal FEV1 is normal.  FEV1 FVC ratio was mildly decreased.  Postbronchodilator there is no significant change in the FEV1.  Total lung capacity is normal residual volume is normal residual volume total lung capacity ratio is normal.  FRC is decreased.  DLCO was decreased.  IMPRESSION:  This pulmonary function study is basically within normal limits.  DLCO was decreased and may be affected by active smoking status  Allyne Gee, MD [facility] Pulmonary Critical Care Medicine Sleep Medicine

## 2022-04-17 ENCOUNTER — Ambulatory Visit: Payer: Medicare Other

## 2022-04-17 LAB — PULMONARY FUNCTION TEST

## 2022-04-19 ENCOUNTER — Ambulatory Visit: Payer: Medicare Other

## 2022-04-20 ENCOUNTER — Other Ambulatory Visit: Payer: Self-pay | Admitting: Internal Medicine

## 2022-04-20 ENCOUNTER — Encounter: Payer: Self-pay | Admitting: Internal Medicine

## 2022-04-20 ENCOUNTER — Other Ambulatory Visit
Admission: RE | Admit: 2022-04-20 | Discharge: 2022-04-20 | Disposition: A | Discharge status: Home / Self Care | Payer: Medicare Other | Source: Ambulatory Visit | Attending: Internal Medicine | Admitting: Internal Medicine

## 2022-04-20 ENCOUNTER — Ambulatory Visit (INDEPENDENT_AMBULATORY_CARE_PROVIDER_SITE_OTHER): Payer: Medicare Other | Admitting: Internal Medicine

## 2022-04-20 VITALS — BP 119/73 | HR 66 | Temp 98.0°F | Resp 16 | Ht 66.0 in | Wt 186.6 lb

## 2022-04-20 DIAGNOSIS — Z9981 Dependence on supplemental oxygen: Secondary | ICD-10-CM

## 2022-04-20 DIAGNOSIS — R0602 Shortness of breath: Secondary | ICD-10-CM | POA: Diagnosis not present

## 2022-04-20 DIAGNOSIS — R0789 Other chest pain: Secondary | ICD-10-CM | POA: Diagnosis not present

## 2022-04-20 DIAGNOSIS — I6523 Occlusion and stenosis of bilateral carotid arteries: Secondary | ICD-10-CM

## 2022-04-20 DIAGNOSIS — J449 Chronic obstructive pulmonary disease, unspecified: Secondary | ICD-10-CM

## 2022-04-20 DIAGNOSIS — Q256 Stenosis of pulmonary artery: Secondary | ICD-10-CM | POA: Diagnosis not present

## 2022-04-20 DIAGNOSIS — R0902 Hypoxemia: Secondary | ICD-10-CM

## 2022-04-20 DIAGNOSIS — E876 Hypokalemia: Secondary | ICD-10-CM | POA: Diagnosis not present

## 2022-04-20 LAB — BASIC METABOLIC PANEL
Anion gap: 12 (ref 5–15)
BUN: 23 mg/dL (ref 8–23)
CO2: 26 mmol/L (ref 22–32)
Calcium: 9.8 mg/dL (ref 8.9–10.3)
Chloride: 99 mmol/L (ref 98–111)
Creatinine, Ser: 0.89 mg/dL (ref 0.44–1.00)
GFR, Estimated: >60 mL/min (ref >60)
Glucose, Bld: 132 mg/dL — ABNORMAL HIGH (ref 70–99)
Potassium: 3.1 mmol/L — ABNORMAL LOW (ref 3.5–5.1)
Sodium: 137 mmol/L (ref 135–145)

## 2022-04-20 MED ORDER — TIOTROPIUM BROMIDE-OLODATEROL 2.5-2.5 MCG/ACT IN AERS: 2.0000 | INHALATION_SPRAY | Freq: Every day | RESPIRATORY_TRACT | 4 refills | Status: DC

## 2022-04-20 NOTE — Progress Notes (Signed)
[facility] [place], [place] [address]  Pulmonary Sleep Medicine   Office Visit Note  Patient Name: Amber Holder DOB: 1946/07/25 MRN [id number]  Date of Service: 04/20/2022  Complaints/HPI: Patient is here for follow up of hypoxia. She has mild COPD. Patient has been on the oxygen for little bit over a month. She states it does seem to help. Patient states when she takes the oxygen off she gets short of breath. She has had PFT and spiro showing MILD COPD in the past She was smoking up until this year. Now has not been smoking. I reviewed her CT of the chest she has some areas of bronchiectasis. A CT scan done back in 2017 had shown some PA stenosis. She also states that back in her 9s she used to get a lot of pneumonias which may explain her bronchiectasis.  ROS  General: (-) fever, (-) chills, (-) night sweats, (-) weakness Skin: (-) rashes, (-) itching,. Eyes: (-) visual changes, (-) redness, (-) itching. Nose and Sinuses: (-) nasal stuffiness or itchiness, (-) postnasal drip, (-) nosebleeds, (-) sinus trouble. Mouth and Throat: (-) sore throat, (-) hoarseness. Neck: (-) swollen glands, (-) enlarged thyroid, (-) neck pain. Respiratory: -  cough, (-) bloody sputum, + shortness of breath, - wheezing. Cardiovascular: - ankle swelling, (-) chest pain. Lymphatic: (-) lymph node enlargement. Neurologic: (-) numbness, (-) tingling. Psychiatric: (-) anxiety, (-) depression   Current Medication: Outpatient Encounter Medications as of 04/20/2022  Medication Sig   acetaminophen (TYLENOL) 500 MG chewable tablet Chew 1,000 mg by mouth every 8 (eight) hours as needed for pain.   albuterol (VENTOLIN HFA) 108 (90 Base) MCG/ACT inhaler Inhale 2 puffs into the lungs every 6 (six) hours as needed for wheezing or shortness of breath.   ALPRAZolam (XANAX) 0.25 MG tablet Take one tab po bid for anxiety prn   aspirin EC 81 MG tablet Take 1 tablet (81 mg total) by mouth  daily. Swallow whole.   atorvastatin (LIPITOR) 20 MG tablet Take 1 tablet (20 mg total) by mouth daily.   buPROPion (WELLBUTRIN XL) 150 MG 24 hr tablet TAKE 1 TABLET BY MOUTH EVERY DAY   cholecalciferol (VITAMIN D) 400 UNITS TABS tablet Take 2,000 Units by mouth daily.    CRANBERRY PO Take 25,000 mg by mouth daily.   dapagliflozin propanediol (FARXIGA) 10 MG TABS tablet Take 1 tablet (10 mg total) by mouth daily before breakfast.   ergocalciferol (DRISDOL) 1.25 MG (50000 UT) capsule Take one cap q week   fluconazole (DIFLUCAN) 150 MG tablet Take one tab po q week for fungal infection   fluticasone (FLOVENT HFA) 110 MCG/ACT inhaler Inhale 2 puffs into the lungs 2 (two) times daily as needed.   glucose blood (ONETOUCH VERIO) test strip BLOOD SUGAR TESTING ONCE DAILY . DX E11.65   hydrocortisone cream 0.5 % Apply topically as needed.    ibandronate (BONIVA) 150 MG tablet Take 1 tablet (150 mg total) by mouth every 30 (thirty) days.   Lancets (ONETOUCH DELICA PLUS WPYKDX83J) MISC Use  as directed twice a daily DX E11.65   Magnesium 250 MG TABS Take by mouth. Takes 1 tablet 2-3 times per week   montelukast (SINGULAIR) 10 MG tablet Take 10 mg by mouth daily as needed.   Multiple Vitamins-Minerals (MULTIVITAMIN ADULTS PO) Take 1 tablet by mouth daily.   nitrofurantoin, macrocrystal-monohydrate, (MACROBID) 100 MG capsule Take 1 capsule (100 mg total) by mouth 2 (two) times daily. Use as instructed  omeprazole (PRILOSEC) 40 MG capsule Take 1 capsule (40 mg total) by mouth daily.   OXYGEN Inhale 3 L into the lungs. Pt uses [facility] for Oxygen   potassium chloride SA (KLOR-CON M) 20 MEQ tablet TAKE 2 TABS IN AM AND ONE TAB AT [facility] AND THEN MAY TAKE ONE EXTRA PER MD DIRECTION   Simethicone 80 MG TABS Take 1 tablet (80 mg total) by mouth as needed (burping). As directed.   tiotropium (SPIRIVA) 18 MCG inhalation capsule Place 1 capsule (18 mcg total) into inhaler and inhale daily.   torsemide  (DEMADEX) 20 MG tablet Take 1 tablet (20 mg total) by mouth daily.   metolazone (ZAROXOLYN) 5 MG tablet Take 0.5 tablets (2.5 mg total) by mouth daily. Take 30 mins prior to your Torsemide. (Patient taking differently: Take 5 mg by mouth daily. Take 30 mins prior to your Torsemide.)   No facility-administered encounter medications on file as of 04/20/2022.    Surgical History: Past Surgical History:  Procedure Laterality Date   APPENDECTOMY     cataract surgery Bilateral    CHOLECYSTECTOMY     COLONOSCOPY     COLONOSCOPY WITH PROPOFOL N/A 05/28/2015   Procedure: COLONOSCOPY WITH PROPOFOL;  Surgeon: Lollie Sails, MD;  Location: [facility] ENDOSCOPY;  Service: Endoscopy;  Laterality: N/A;   COLONOSCOPY WITH PROPOFOL N/A 10/28/2018   Procedure: COLONOSCOPY WITH PROPOFOL;  Surgeon: Lollie Sails, MD;  Location: [facility] ENDOSCOPY;  Service: Endoscopy;  Laterality: N/A;   EYE SURGERY Bilateral 2015   Cataract Extraction with IOL   LITHOTRIPSY  2015   has had many stones   Mastoidotomy  1970   ROTATOR CUFF REPAIR Right 2011   TOTAL HIP ARTHROPLASTY Right 06/14/2016   Procedure: TOTAL HIP ARTHROPLASTY;  Surgeon: Dereck Leep, MD;  Location: [facility] ORS;  Service: Orthopedics;  Laterality: Right;    Medical History: Past Medical History:  Diagnosis Date   Anxiety 01/02/2012   Arthritis    Asthma    Blockage of coronary artery of heart (HCC)    Chronic kidney disease    Kidney stones   Colon polyps    COPD (chronic obstructive pulmonary disease) (HCC)    Diabetes mellitus without complication (HCC)    GERD (gastroesophageal reflux disease)    HOH (hard of hearing)    Bilateral hearing aids   Kidney stone    Osteoporosis    Sleep apnea    Does not use C-PAP on a regular basis   Venous stasis     Family History: Family History  Problem Relation Age of Onset   Breast cancer Maternal Aunt 50   Bladder Cancer Neg Hx    Kidney cancer Neg Hx     Social History: Social History    Socioeconomic History   Marital status: Divorced    Spouse name: Not on file   Number of children: Not on file   Years of education: Not on file   Highest education level: Not on file  Occupational History   Not on file  Tobacco Use   Smoking status: Former    Packs/day: 1.00    Types: Cigarettes    Quit date: 12/28/2021    Years since quitting: 0.3    Passive exposure: Never   Smokeless tobacco: Never   Tobacco comments:    1 pack daily//quit 12/28/2021  Vaping Use   Vaping Use: Never used  Substance and Sexual Activity   Alcohol use: Not Currently    Comment:  very rarely    Drug use: No   Sexual activity: Not Currently  Other Topics Concern   Not on file  Social History Narrative   Not on file   Social Determinants of Health   Financial Resource Strain: Not on file  Food Insecurity: Not on file  Transportation Needs: Not on file  Physical Activity: Not on file  Stress: Not on file  Social Connections: Not on file  Intimate Partner Violence: Not on file    Vital Signs: Blood pressure 119/73, pulse 66, temperature 98 F (36.7 C), resp. rate 16, height '5\' 6"'$  (1.676 m), weight 186 lb 9.6 oz (84.6 kg), SpO2 96 %.  Examination: General Appearance: The patient is well-developed, well-nourished, and in no distress. Skin: Gross inspection of skin unremarkable. Head: normocephalic, no gross deformities. Eyes: no gross deformities noted. ENT: ears appear grossly normal no exudates. Neck: Supple. No thyromegaly. No LAD. Respiratory: few scattered rhonchi noted. Cardiovascular: Normal S1 and S2 without murmur or rub. Extremities: No cyanosis. pulses are equal. Neurologic: Alert and oriented. No involuntary movements.  LABS: Recent Results (from the past 2160 hour(s))  POCT Urinalysis Dipstick     Status: Abnormal   Collection Time: 01/31/22 10:40 AM  Result Value Ref Range   Color, UA     Clarity, UA     Glucose, UA Negative Negative   Bilirubin, UA Small     Ketones, UA Trace    Spec Grav, UA 1.010 1.010 - 1.025   Blood, UA Trace    pH, UA 6.5 5.0 - 8.0   Protein, UA Positive (A) Negative   Urobilinogen, UA 0.2 0.2 or 1.0 E.U./dL   Nitrite, UA Negative    Leukocytes, UA Large (3+) (A) Negative   Appearance     Odor    CULTURE, URINE COMPREHENSIVE     Status: Abnormal   Collection Time: 01/31/22  2:47 PM   Specimen: Urine   Urine  Result Value Ref Range   Urine Culture, Comprehensive Final report (A)    Organism ID, Bacteria Klebsiella pneumoniae (A)     Comment: Cefazolin <=4 ug/mL Cefazolin with an MIC <=16 predicts susceptibility to the oral agents cefaclor, cefdinir, cefpodoxime, cefprozil, cefuroxime, cephalexin, and loracarbef when used for therapy of uncomplicated urinary tract infections due to E. coli, Klebsiella pneumoniae, and Proteus mirabilis. Greater than 100,000 colony forming units per mL    ANTIMICROBIAL SUSCEPTIBILITY Comment     Comment:       ** S = Susceptible; I = Intermediate; R = Resistant **                    P = Positive; N = Negative             MICS are expressed in micrograms per mL    Antibiotic                 RSLT#1    RSLT#2    RSLT#3    RSLT#4 Amoxicillin/Clavulanic Acid    S Ampicillin                     R Cefepime                       S Ceftriaxone                    S Cefuroxime  S Ciprofloxacin                  S Ertapenem                      S Gentamicin                     S Imipenem                       S Levofloxacin                   S Meropenem                      S Nitrofurantoin                 S Piperacillin/Tazobactam        S Tetracycline                   S Tobramycin                     S Trimethoprim/Sulfa             S   Basic metabolic panel     Status: Abnormal   Collection Time: 02/09/22 12:36 PM  Result Value Ref Range   Sodium 138 135 - 145 mmol/L   Potassium 3.2 (L) 3.5 - 5.1 mmol/L   Chloride 99 98 - 111 mmol/L   CO2 28 22 - 32 mmol/L    Glucose, Bld 127 (H) 70 - 99 mg/dL    Comment: Glucose reference range applies only to samples taken after fasting for at least 8 hours.   BUN 33 (H) 8 - 23 mg/dL   Creatinine, Ser 0.86 0.44 - 1.00 mg/dL   Calcium 9.5 8.9 - 10.3 mg/dL   GFR, Estimated >60 >60 mL/min    Comment: (NOTE) Calculated using the CKD-EPI Creatinine Equation (2021)    Anion gap 11 5 - 15    Comment: Performed at [facility], [address] [place],  [address]  POCT Urinalysis Dipstick     Status: Abnormal   Collection Time: 02/14/22  9:12 AM  Result Value Ref Range   Color, UA     Clarity, UA     Glucose, UA Positive (A) Negative   Bilirubin, UA Small    Ketones, UA Small    Spec Grav, UA 1.015 1.010 - 1.025   Blood, UA Trace    pH, UA 7.0 5.0 - 8.0   Protein, UA Negative Negative   Urobilinogen, UA 0.2 0.2 or 1.0 E.U./dL   Nitrite, UA Negative    Leukocytes, UA Trace (A) Negative   Appearance     Odor    CULTURE, URINE COMPREHENSIVE     Status: None   Collection Time: 02/14/22 11:11 AM   Specimen: Urine   Urine  Result Value Ref Range   Urine Culture, Comprehensive Final report    Organism ID, Bacteria Comment     Comment: Mixed urogenital flora 10,000-25,000 colony forming units per mL   Basic metabolic panel     Status: Abnormal   Collection Time: 02/22/22  1:10 PM  Result Value Ref Range   Sodium 140 135 - 145 mmol/L   Potassium 3.5 3.5 - 5.1 mmol/L   Chloride 107 98 - 111 mmol/L   CO2 27 22 - 32 mmol/L  Glucose, Bld 115 (H) 70 - 99 mg/dL    Comment: Glucose reference range applies only to samples taken after fasting for at least 8 hours.   BUN 23 8 - 23 mg/dL   Creatinine, Ser 0.77 0.44 - 1.00 mg/dL   Calcium 9.3 8.9 - 10.3 mg/dL   GFR, Estimated >60 >60 mL/min    Comment: (NOTE) Calculated using the CKD-EPI Creatinine Equation (2021)    Anion gap 6 5 - 15    Comment: Performed at [facility], [address] [place], [place] [address]  POCT  Urinalysis Dipstick     Status: Abnormal   Collection Time: 02/27/22  9:30 AM  Result Value Ref Range   Color, UA     Clarity, UA     Glucose, UA Positive (A) Negative    Comment: 2000 +   Bilirubin, UA neg    Ketones, UA neg    Spec Grav, UA 1.015 1.010 - 1.025   Blood, UA moderate    pH, UA 6.0 5.0 - 8.0   Protein, UA Positive (A) Negative    Comment: trace   Urobilinogen, UA 0.2 0.2 or 1.0 E.U./dL   Nitrite, UA neg    Leukocytes, UA Moderate (2+) (A) Negative   Appearance     Odor    Urine Microalbumin w/creat. ratio     Status: Abnormal   Collection Time: 02/27/22  9:51 AM  Result Value Ref Range   Creatinine, Urine 78.4 Not Estab. mg/dL   Microalbumin, Urine 42.0 Not Estab. ug/mL   Microalb/Creat Ratio 54 (H) 0 - 29 mg/g creat    Comment:                        Normal:                0 -  29                        Moderately increased: 30 - 300                        Severely increased:       >300   CULTURE, URINE COMPREHENSIVE     Status: Abnormal   Collection Time: 02/27/22  9:51 AM   Specimen: Urine   Urine  Result Value Ref Range   Urine Culture, Comprehensive Final report (A)    Organism ID, Bacteria Klebsiella pneumoniae (A)     Comment: Cefazolin <=4 ug/mL Cefazolin with an MIC <=16 predicts susceptibility to the oral agents cefaclor, cefdinir, cefpodoxime, cefprozil, cefuroxime, cephalexin, and loracarbef when used for therapy of uncomplicated urinary tract infections due to E. coli, Klebsiella pneumoniae, and Proteus mirabilis. Greater than 100,000 colony forming units per mL    ANTIMICROBIAL SUSCEPTIBILITY Comment     Comment:       ** S = Susceptible; I = Intermediate; R = Resistant **                    P = Positive; N = Negative             MICS are expressed in micrograms per mL    Antibiotic                 RSLT#1    RSLT#2    RSLT#3    RSLT#4 Amoxicillin/Clavulanic Acid    S Ampicillin  R Cefepime                        S Ceftriaxone                    S Cefuroxime                     S Ciprofloxacin                  S Ertapenem                      S Gentamicin                     S Imipenem                       S Levofloxacin                   S Meropenem                      S Nitrofurantoin                 S Piperacillin/Tazobactam        S Tetracycline                   S Tobramycin                     S Trimethoprim/Sulfa             S   POCT Urinalysis Dipstick     Status: Abnormal   Collection Time: 02/27/22 11:33 AM  Result Value Ref Range   Color, UA     Clarity, UA     Glucose, UA Positive (A) Negative    Comment: 2000 +   Bilirubin, UA neg    Ketones, UA neg    Spec Grav, UA 1.015 1.010 - 1.025   Blood, UA moderate    pH, UA 6.0 5.0 - 8.0   Protein, UA Positive (A) Negative    Comment: trace   Urobilinogen, UA 0.2 0.2 or 1.0 E.U./dL   Nitrite, UA neg    Leukocytes, UA Moderate (2+) (A) Negative   Appearance     Odor    Basic metabolic panel     Status: Abnormal   Collection Time: 03/08/22 11:54 AM  Result Value Ref Range   Sodium 136 135 - 145 mmol/L   Potassium 2.4 (LL) 3.5 - 5.1 mmol/L    Comment: CRITICAL RESULT CALLED TO, READ BACK BY AND VERIFIED WITH DESHA S 1406 03/08/22 HNM    Chloride 91 (L) 98 - 111 mmol/L   CO2 31 22 - 32 mmol/L   Glucose, Bld 219 (H) 70 - 99 mg/dL    Comment: Glucose reference range applies only to samples taken after fasting for at least 8 hours.   BUN 42 (H) 8 - 23 mg/dL   Creatinine, Ser 1.23 (H) 0.44 - 1.00 mg/dL   Calcium 9.5 8.9 - 10.3 mg/dL   GFR, Estimated 46 (L) >60 mL/min    Comment: (NOTE) Calculated using the CKD-EPI Creatinine Equation (2021)    Anion gap 14 5 - 15    Comment: Performed at [facility], [address] [place], [place] [address]  Basic metabolic panel     Status: Abnormal  Collection Time: 03/13/22  7:52 AM  Result Value Ref Range   Sodium 135 135 - 145 mmol/L   Potassium 3.3 (L) 3.5 - 5.1  mmol/L   Chloride 100 98 - 111 mmol/L   CO2 25 22 - 32 mmol/L   Glucose, Bld 151 (H) 70 - 99 mg/dL    Comment: Glucose reference range applies only to samples taken after fasting for at least 8 hours.   BUN 37 (H) 8 - 23 mg/dL   Creatinine, Ser 1.08 (H) 0.44 - 1.00 mg/dL   Calcium 9.0 8.9 - 10.3 mg/dL   GFR, Estimated 53 (L) >60 mL/min    Comment: (NOTE) Calculated using the CKD-EPI Creatinine Equation (2021)    Anion gap 10 5 - 15    Comment: Performed at [facility], [address] [place], [place] [address]  Basic metabolic panel     Status: Abnormal   Collection Time: 03/21/22 12:28 PM  Result Value Ref Range   Sodium 139 135 - 145 mmol/L   Potassium 4.4 3.5 - 5.1 mmol/L   Chloride 107 98 - 111 mmol/L   CO2 23 22 - 32 mmol/L   Glucose, Bld 121 (H) 70 - 99 mg/dL    Comment: Glucose reference range applies only to samples taken after fasting for at least 8 hours.   BUN 24 (H) 8 - 23 mg/dL   Creatinine, Ser 0.85 0.44 - 1.00 mg/dL   Calcium 9.2 8.9 - 10.3 mg/dL   GFR, Estimated >60 >60 mL/min    Comment: (NOTE) Calculated using the CKD-EPI Creatinine Equation (2021)    Anion gap 9 5 - 15    Comment: Performed at [facility], [address] [place],  [address]  POCT Urinalysis Dipstick     Status: Abnormal   Collection Time: 03/27/22  9:03 AM  Result Value Ref Range   Color, UA     Clarity, UA     Glucose, UA Positive (A) Negative   Bilirubin, UA Negative    Ketones, UA Negative    Spec Grav, UA 1.010 1.010 - 1.025   Blood, UA Negative    pH, UA 6.0 5.0 - 8.0   Protein, UA Negative Negative   Urobilinogen, UA 0.2 0.2 or 1.0 E.U./dL   Nitrite, UA Negative    Leukocytes, UA Negative Negative   Appearance     Odor    Pulmonary Function Test     Status: None   Collection Time: 04/17/22  3:33 PM  Result Value Ref Range   FEV1     FVC     FEV1/FVC     TLC     DLCO      Radiology: No results found.  No results found.  No results  found.    Assessment and Plan: Patient Active Problem List   Diagnosis Date Noted   Hypokalemia 09/12/2020   Chronic venous stasis 01/11/2020   Atopic dermatitis 60/05/9322   Diastolic dysfunction 55/73/2202   Peripheral edema 09/10/2019   Dyspnea on exertion 09/10/2019   Muscle cramps 09/10/2019   Type 2 diabetes mellitus with hyperglycemia ([place]) 05/18/2019   Seasonal allergic rhinitis 05/18/2019   Screening for breast cancer 05/18/2019   Stenosis of left carotid artery 02/05/2019   Acute upper respiratory infection 10/09/2018   Flu-like symptoms 10/09/2018   Sore throat 10/09/2018   Vitamin D deficiency 07/27/2018   Need for vaccination against Streptococcus pneumoniae using pneumococcal conjugate vaccine 7 07/27/2018   Encounter for  general adult medical examination with abnormal findings 04/07/2018   Primary generalized (osteo)arthritis 04/07/2018   Hematuria, microscopic 57/84/6962   Renal colic 95/28/4132   Urinary tract infection without hematuria 11/21/2017   Dysuria 11/21/2017   Uncontrolled type 2 diabetes mellitus with hyperglycemia ([place]) 11/21/2017   Diabetes mellitus type 2, uncomplicated ([place]) 44/08/270   Osteoporosis 10/04/2017   S/P total hip arthroplasty 06/14/2016   Inguinal hernia, left 53/66/4403   Umbilical hernia 47/42/5956   Increased frequency of urination 11/27/2012   Chronic cystitis 11/19/2012   Incomplete emptying of bladder 11/19/2012   Medullary sponge kidney 11/19/2012   Mixed urge and stress incontinence 11/19/2012   Anxiety 01/02/2012   Calculus of kidney 01/02/2012   Chronic obstructive pulmonary disease ([place]) 01/02/2012   GERD (gastroesophageal reflux disease) 01/02/2012   Hearing loss 01/02/2012   Mixed hyperlipidemia 01/02/2012   Obesity, unspecified 01/02/2012   OSA on CPAP 01/02/2012   Tobacco abuse 01/02/2012   Venous insufficiency 01/02/2012    1. Hypoxia She has significant hypoxia she uses her oxygen this does appear to  improve with some shortness of breath however she has only mild COPD noted but there was an abnormality on her pulmonary artery in 2017 unsure if this is contributing but needs further work-up going to order an angio to assess for pulmonary arterial stenosis  2. Shortness of breath Multiple factors COPD is mild she has hypoxia she has some pulmonary arterial abnormality noted in 2017  3. Obstructive chronic bronchitis without exacerbation ([place]) We will continue with her inhalers we will switch her over to [facility] as she does not tolerate the steroids - Tiotropium Bromide-Olodaterol 2.5-2.5 MCG/ACT AERS; Inhale 2 puffs into the lungs daily.  Dispense: 1 each; Refill: 4  4. Oxygen dependent Continue current oxygen levels  5. Pulmonary artery stenosis As noted above abnormal CT angio done in 2017 not followed up we will get CT angio now to see if there is significant stenosis and then consider a vascular opinion as means to explain her degree of shortness of breath and she expresses - CT Angio Chest W/Cm &/Or Wo Cm; Future  6. Other chest pain - CT Angio Chest W/Cm &/Or Wo Cm; Future   General Counseling: I have discussed the findings of the evaluation and examination with Amber Holder.  I have also discussed any further diagnostic evaluation thatmay be needed or ordered today. Amber Holder verbalizes understanding of the findings of todays visit. We also reviewed her medications today and discussed drug interactions and side effects including but not limited excessive drowsiness and altered mental states. We also discussed that there is always a risk not just to her but also people around her. she has been encouraged to call the office with any questions or concerns that should arise related to todays visit.  Orders Placed This Encounter  Procedures   CT Angio Chest W/Cm &/Or Wo Cm    Standing Status:   Future    Standing Expiration Date:   04/21/2023    Order Specific Question:   If indicated for the  ordered procedure, I authorize the administration of contrast media per Radiology protocol    Answer:   Yes    Order Specific Question:   Preferred imaging location?    Answer:   [facility]   6 minute walk    Standing Status:   Future    Standing Expiration Date:   04/21/2023     Time spent: 74  I have personally obtained a history, examined  the patient, evaluated laboratory and imaging results, formulated the assessment and plan and placed orders.    Allyne Gee, MD [facility] Pulmonary and Critical Care Sleep medicine

## 2022-04-20 NOTE — Progress Notes (Signed)
Pt has low potassium on her recent labs

## 2022-04-21 ENCOUNTER — Telehealth: Payer: Self-pay

## 2022-04-21 ENCOUNTER — Other Ambulatory Visit: Payer: Self-pay

## 2022-04-21 DIAGNOSIS — E876 Hypokalemia: Secondary | ICD-10-CM

## 2022-04-21 NOTE — Telephone Encounter (Signed)
Do PA for Spiriva

## 2022-04-21 NOTE — Telephone Encounter (Addendum)
PA for STIOLTO sent 04/21/22 @ 408 pm  Came back approved on 04/21/22 valid 08/28/21 to 04/21/23  Per Joe at [facility] it went thru at 65.00 co-pay

## 2022-04-21 NOTE — Telephone Encounter (Signed)
Pt advised for potassium is low  as per dfk take potassium 20 meq take 2 tab po in the morning and take 2 tab at night for 3 days and repeat labs in 1 week

## 2022-04-21 NOTE — Telephone Encounter (Signed)
-----   Message from Lavera Guise, MD sent at 04/20/2022  3:09 PM EDT ----- Pt has low potassium on her recent labs

## 2022-04-24 ENCOUNTER — Ambulatory Visit (INDEPENDENT_AMBULATORY_CARE_PROVIDER_SITE_OTHER): Payer: Medicare Other | Admitting: Internal Medicine

## 2022-04-24 ENCOUNTER — Encounter: Payer: Self-pay | Admitting: Internal Medicine

## 2022-04-24 VITALS — BP 108/68 | HR 84 | Temp 98.1°F | Resp 16 | Ht 66.0 in | Wt 184.8 lb

## 2022-04-24 DIAGNOSIS — J9611 Chronic respiratory failure with hypoxia: Secondary | ICD-10-CM

## 2022-04-24 DIAGNOSIS — E876 Hypokalemia: Secondary | ICD-10-CM | POA: Diagnosis not present

## 2022-04-24 DIAGNOSIS — I5032 Chronic diastolic (congestive) heart failure: Secondary | ICD-10-CM | POA: Diagnosis not present

## 2022-04-24 DIAGNOSIS — Z91041 Radiographic dye allergy status: Secondary | ICD-10-CM | POA: Diagnosis not present

## 2022-04-24 DIAGNOSIS — R300 Dysuria: Secondary | ICD-10-CM | POA: Diagnosis not present

## 2022-04-24 DIAGNOSIS — E1165 Type 2 diabetes mellitus with hyperglycemia: Secondary | ICD-10-CM

## 2022-04-24 LAB — POCT URINALYSIS DIPSTICK
Bilirubin, UA: NEGATIVE
Glucose, UA: POSITIVE — AB
Leukocytes, UA: NEGATIVE
Nitrite, UA: NEGATIVE
Protein, UA: NEGATIVE
Spec Grav, UA: 1.01 (ref 1.010–1.025)
Urobilinogen, UA: 0.2 E.U./dL
pH, UA: 6.5 (ref 5.0–8.0)

## 2022-04-24 LAB — POCT GLYCOSYLATED HEMOGLOBIN (HGB A1C): Hemoglobin A1C: 6.2 % — AB (ref 4.0–5.6)

## 2022-04-24 MED ORDER — DIPHENHYDRAMINE HCL 50 MG PO TABS: ORAL_TABLET | ORAL | 0 refills | Status: DC

## 2022-04-24 MED ORDER — PREDNISONE 50 MG PO TABS: ORAL_TABLET | ORAL | 0 refills | Status: DC

## 2022-04-24 NOTE — Progress Notes (Signed)
[facility] [place], [place] [address]  Internal MEDICINE  Office Visit Note  Patient Name: Amber Holder  [id number]  [id number]  Date of Service: 04/24/2022  Chief Complaint  Patient presents with   Diabetes   itching    On face happening for last few months or longer    HPI Patient is here for routine follow-up. She was seen by pulmonary to review her PFTs, working diagnosis of pulmonary stenosis patient will get CT angio and might need right-sided heart cath, patient does have allergies to contrast and will need pretreatment Patient has done well with Spiriva however Flovent was added to her regimen Diabetes under good control her hemoglobin A1c is dropped she is on Farxiga 10 mg once a day Recent BMP showed low potassium which was adjusted appropriately we will continue to monitor closely Symptoms of dysuria, sample taken today Patient is losing weight, breathing is better she is more active She also has not been smoking    Current Medication: Outpatient Encounter Medications as of 04/24/2022  Medication Sig   diphenhydrAMINE (BENADRYL) 50 MG tablet Take one tab 13 hrs, 7 hrs and 1 hr before procedure along with prednisone, may take one extra if experienced itching or hives   predniSONE (DELTASONE) 50 MG tablet Take one tab 13 hrs, 7 hrs and 1 hr before scheduled time along with Benadryl, may take one extra after the procedure if experienced itching and hives   acetaminophen (TYLENOL) 500 MG chewable tablet Chew 1,000 mg by mouth every 8 (eight) hours as needed for pain.   albuterol (VENTOLIN HFA) 108 (90 Base) MCG/ACT inhaler Inhale 2 puffs into the lungs every 6 (six) hours as needed for wheezing or shortness of breath.   ALPRAZolam (XANAX) 0.25 MG tablet Take one tab po bid for anxiety prn   aspirin EC 81 MG tablet Take 1 tablet (81 mg total) by mouth daily. Swallow whole.   atorvastatin (LIPITOR) 20 MG tablet Take 1 tablet (20 mg total) by mouth  daily.   buPROPion (WELLBUTRIN XL) 150 MG 24 hr tablet TAKE 1 TABLET BY MOUTH EVERY DAY   cholecalciferol (VITAMIN D) 400 UNITS TABS tablet Take 2,000 Units by mouth daily.    CRANBERRY PO Take 25,000 mg by mouth daily.   dapagliflozin propanediol (FARXIGA) 10 MG TABS tablet Take 1 tablet (10 mg total) by mouth daily before breakfast.   ergocalciferol (DRISDOL) 1.25 MG (50000 UT) capsule Take one cap q week   fluconazole (DIFLUCAN) 150 MG tablet Take one tab po q week for fungal infection   fluticasone (FLOVENT HFA) 110 MCG/ACT inhaler Inhale 2 puffs into the lungs 2 (two) times daily as needed.   glucose blood (ONETOUCH VERIO) test strip BLOOD SUGAR TESTING ONCE DAILY . DX E11.65   hydrocortisone cream 0.5 % Apply topically as needed.    ibandronate (BONIVA) 150 MG tablet Take 1 tablet (150 mg total) by mouth every 30 (thirty) days.   Lancets (ONETOUCH DELICA PLUS IOXBDZ32D) MISC Use  as directed twice a daily DX E11.65   Magnesium 250 MG TABS Take by mouth. Takes 1 tablet 2-3 times per week   metolazone (ZAROXOLYN) 5 MG tablet Take 0.5 tablets (2.5 mg total) by mouth daily. Take 30 mins prior to your Torsemide. (Patient taking differently: Take 5 mg by mouth daily. Take 30 mins prior to your Torsemide.)   montelukast (SINGULAIR) 10 MG tablet Take 10 mg by mouth daily as needed.   Multiple Vitamins-Minerals (MULTIVITAMIN  ADULTS PO) Take 1 tablet by mouth daily.   nitrofurantoin, macrocrystal-monohydrate, (MACROBID) 100 MG capsule Take 1 capsule (100 mg total) by mouth 2 (two) times daily. Use as instructed   omeprazole (PRILOSEC) 40 MG capsule Take 1 capsule (40 mg total) by mouth daily.   OXYGEN Inhale 3 L into the lungs. Pt uses [facility] for Oxygen   potassium chloride SA (KLOR-CON M) 20 MEQ tablet TAKE 2 TABS IN AM AND ONE TAB AT [facility] AND THEN MAY TAKE ONE EXTRA PER MD DIRECTION   Simethicone 80 MG TABS Take 1 tablet (80 mg total) by mouth as needed (burping). As directed.    Tiotropium Bromide-Olodaterol 2.5-2.5 MCG/ACT AERS Inhale 2 puffs into the lungs daily.   torsemide (DEMADEX) 20 MG tablet Take 1 tablet (20 mg total) by mouth daily.   No facility-administered encounter medications on file as of 04/24/2022.    Surgical History: Past Surgical History:  Procedure Laterality Date   APPENDECTOMY     cataract surgery Bilateral    CHOLECYSTECTOMY     COLONOSCOPY     COLONOSCOPY WITH PROPOFOL N/A 05/28/2015   Procedure: COLONOSCOPY WITH PROPOFOL;  Surgeon: Lollie Sails, MD;  Location: [facility] ENDOSCOPY;  Service: Endoscopy;  Laterality: N/A;   COLONOSCOPY WITH PROPOFOL N/A 10/28/2018   Procedure: COLONOSCOPY WITH PROPOFOL;  Surgeon: Lollie Sails, MD;  Location: [facility] ENDOSCOPY;  Service: Endoscopy;  Laterality: N/A;   EYE SURGERY Bilateral 2015   Cataract Extraction with IOL   LITHOTRIPSY  2015   has had many stones   Mastoidotomy  1970   ROTATOR CUFF REPAIR Right 2011   TOTAL HIP ARTHROPLASTY Right 06/14/2016   Procedure: TOTAL HIP ARTHROPLASTY;  Surgeon: Dereck Leep, MD;  Location: [facility] ORS;  Service: Orthopedics;  Laterality: Right;    Medical History: Past Medical History:  Diagnosis Date   Anxiety 01/02/2012   Arthritis    Asthma    Blockage of coronary artery of heart (HCC)    Chronic kidney disease    Kidney stones   Colon polyps    COPD (chronic obstructive pulmonary disease) (HCC)    Diabetes mellitus without complication (HCC)    GERD (gastroesophageal reflux disease)    HOH (hard of hearing)    Bilateral hearing aids   Kidney stone    Osteoporosis    Sleep apnea    Does not use C-PAP on a regular basis   Venous stasis     Family History: Family History  Problem Relation Age of Onset   Breast cancer Maternal Aunt 33   Bladder Cancer Neg Hx    Kidney cancer Neg Hx     Social History   Socioeconomic History   Marital status: Divorced    Spouse name: Not on file   Number of children: Not on file   Years of  education: Not on file   Highest education level: Not on file  Occupational History   Not on file  Tobacco Use   Smoking status: Former    Packs/day: 1.00    Types: Cigarettes    Quit date: 12/28/2021    Years since quitting: 0.3    Passive exposure: Never   Smokeless tobacco: Never   Tobacco comments:    1 pack daily//quit 12/28/2021  Vaping Use   Vaping Use: Never used  Substance and Sexual Activity   Alcohol use: Not Currently    Comment: very rarely    Drug use: No   Sexual activity: Not  Currently  Other Topics Concern   Not on file  Social History Narrative   Not on file   Social Determinants of Health   Financial Resource Strain: Not on file  Food Insecurity: Not on file  Transportation Needs: Not on file  Physical Activity: Not on file  Stress: Not on file  Social Connections: Not on file  Intimate Partner Violence: Not on file      Review of Systems  Constitutional:  Negative for fatigue and fever.  HENT:  Negative for congestion, mouth sores and postnasal drip.   Respiratory:  Negative for cough.   Cardiovascular:  Negative for chest pain.  Genitourinary:  Negative for flank pain.  Psychiatric/Behavioral: Negative.      Vital Signs: BP 108/68   Pulse 84   Temp 98.1 F (36.7 C)   Resp 16   Ht '5\' 6"'$  (1.676 m)   Wt 184 lb 12.8 oz (83.8 kg)   SpO2 95% Comment: 3 liters oxygen  BMI 29.83 kg/m    Physical Exam Constitutional:      Appearance: Normal appearance.  HENT:     Head: Normocephalic and atraumatic.     Nose: Nose normal.     Mouth/Throat:     Mouth: Mucous membranes are moist.     Pharynx: No posterior oropharyngeal erythema.  Eyes:     Extraocular Movements: Extraocular movements intact.     Pupils: Pupils are equal, round, and reactive to light.  Cardiovascular:     Pulses: Normal pulses.     Heart sounds: Normal heart sounds.  Pulmonary:     Effort: Pulmonary effort is normal.     Breath sounds: Normal breath sounds.   Neurological:     General: No focal deficit present.     Mental Status: She is alert.  Psychiatric:        Mood and Affect: Mood normal.        Behavior: Behavior normal.        Assessment/Plan: 1. Type 2 diabetes mellitus with hyperglycemia, without long-term current use of insulin (HCC) Improved hg a1c, losing wt, pleased with her improvement  - POCT glycosylated hemoglobin (Hb A1C)  2. Chronic diastolic congestive heart failure (HCC) Stable with fine balance between overdiuresis and compensated Hf, will continue to monitor, Stable chronic diastolic heart failure patient is being managed on metolazone 2.5 mg Monday Wednesday Friday and Demadex 20 mg on rest of the days, patient was taking 60 meq of KCl every day  3. Hypokalemia Recheck potassium in one week. KCL intake is increased to 80 a day   4. Dysuria - POCT Urinalysis Dipstick - CULTURE, URINE COMPREHENSIVE  5. Chronic respiratory failure with hypoxia (HCC) Working dx of pulmonic stenosis, will get CT angio  6. Allergic to IV contrast Pt is pre- treated for contarst allergy  - predniSONE (DELTASONE) 50 MG tablet; Take one tab 13 hrs, 7 hrs and 1 hr before scheduled time along with Benadryl, may take one extra after the procedure if experienced itching and hives  Dispense: 4 tablet; Refill: 0 - diphenhydrAMINE (BENADRYL) 50 MG tablet; Take one tab 13 hrs, 7 hrs and 1 hr before procedure along with prednisone, may take one extra if experienced itching or hives  Dispense: 4 tablet; Refill: 0   General Counseling: Teressa verbalizes understanding of the findings of todays visit and agrees with plan of treatment. I have discussed any further diagnostic evaluation that may be needed or ordered today. We also reviewed her medications  today. she has been encouraged to call the office with any questions or concerns that should arise related to todays visit.    Orders Placed This Encounter  Procedures   CULTURE, URINE  COMPREHENSIVE   POCT glycosylated hemoglobin (Hb A1C)   POCT Urinalysis Dipstick    Meds ordered this encounter  Medications   predniSONE (DELTASONE) 50 MG tablet    Sig: Take one tab 13 hrs, 7 hrs and 1 hr before scheduled time along with Benadryl, may take one extra after the procedure if experienced itching and hives    Dispense:  4 tablet    Refill:  0   diphenhydrAMINE (BENADRYL) 50 MG tablet    Sig: Take one tab 13 hrs, 7 hrs and 1 hr before procedure along with prednisone, may take one extra if experienced itching or hives    Dispense:  4 tablet    Refill:  0    Total time spent:35 Minutes Time spent includes review of chart, medications, test results, and follow up plan with the patient.   [place] Controlled Substance Database was reviewed by me.   Dr Lavera Guise Internal medicine

## 2022-04-25 ENCOUNTER — Telehealth: Payer: Self-pay

## 2022-04-25 MED ORDER — CONTRAST ALLERGY PREMED PACK 3 X 50 MG & 1 X 50 MG PO KIT: 50.0000 mg | PACK | Freq: Once | ORAL | 0 refills | Status: DC | PRN

## 2022-04-25 NOTE — Telephone Encounter (Signed)
Error

## 2022-04-25 NOTE — Telephone Encounter (Signed)
Spoke with phar and canceled pres for prednisone Contrast kit due to they don't have advised phar to filled pres from [facility] send yesterday

## 2022-04-26 ENCOUNTER — Other Ambulatory Visit: Payer: Medicare Other

## 2022-04-26 LAB — CULTURE, URINE COMPREHENSIVE

## 2022-05-02 ENCOUNTER — Other Ambulatory Visit: Payer: Self-pay | Admitting: Internal Medicine

## 2022-05-02 ENCOUNTER — Other Ambulatory Visit
Admission: RE | Admit: 2022-05-02 | Discharge: 2022-05-02 | Disposition: A | Discharge status: Home / Self Care | Payer: Medicare Other | Source: Ambulatory Visit | Attending: Internal Medicine | Admitting: Internal Medicine

## 2022-05-02 ENCOUNTER — Ambulatory Visit: Admission: RE | Admit: 2022-05-02 | Payer: Medicare Other | Source: Ambulatory Visit

## 2022-05-02 DIAGNOSIS — Q256 Stenosis of pulmonary artery: Secondary | ICD-10-CM | POA: Diagnosis present

## 2022-05-02 DIAGNOSIS — E876 Hypokalemia: Secondary | ICD-10-CM | POA: Insufficient documentation

## 2022-05-02 LAB — BASIC METABOLIC PANEL
Anion gap: 12 (ref 5–15)
BUN: 40 mg/dL — ABNORMAL HIGH (ref 8–23)
CO2: 27 mmol/L (ref 22–32)
Calcium: 10.3 mg/dL (ref 8.9–10.3)
Chloride: 99 mmol/L (ref 98–111)
Creatinine, Ser: 1.08 mg/dL — ABNORMAL HIGH (ref 0.44–1.00)
GFR, Estimated: 53 mL/min — ABNORMAL LOW (ref >60)
Glucose, Bld: 184 mg/dL — ABNORMAL HIGH (ref 70–99)
Potassium: 3.4 mmol/L — ABNORMAL LOW (ref 3.5–5.1)
Sodium: 138 mmol/L (ref 135–145)

## 2022-05-03 IMAGING — CT CT CHEST W/O CM
2 of 4 series · 15 of 36 positions shown, 18 images · non-contrast
Comparison: 11/16/2021

CLINICAL DATA: Dyspnea, chronic, central airway disease suspected,
history of tobacco abuse, coronary artery disease



[Series 2: chest 2.00 · axial · 0.67mm/px · z∈[-1316,-996]mm · 12 of 190 slices shown, 15 images]
[im 15/190  mediastinal]
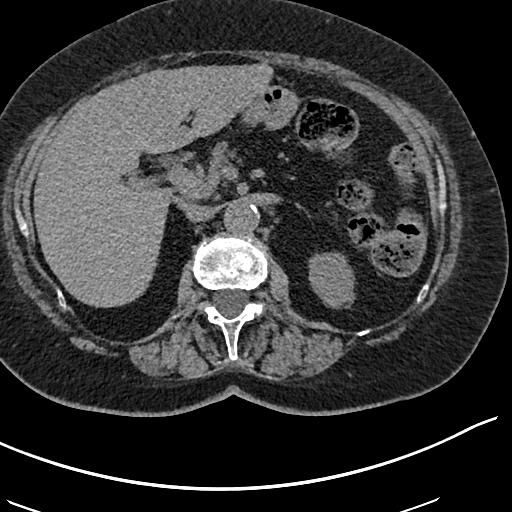
[im 15/190  lung]
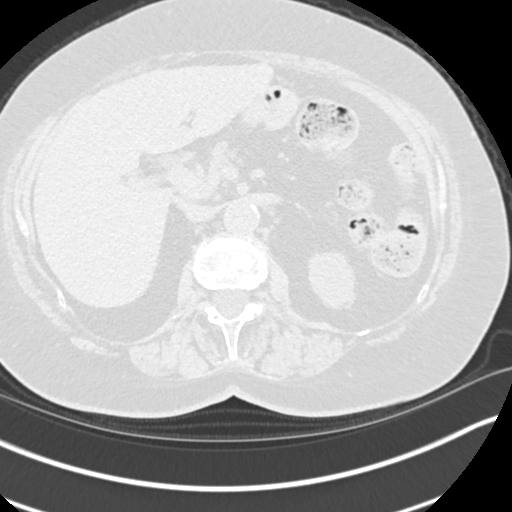
[im 30/190  lung]
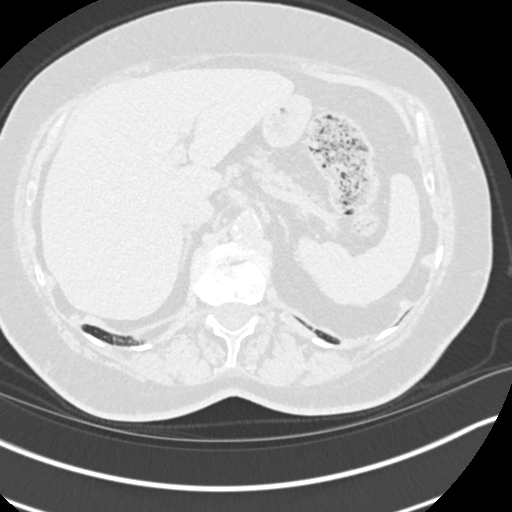
[im 44/190  lung]
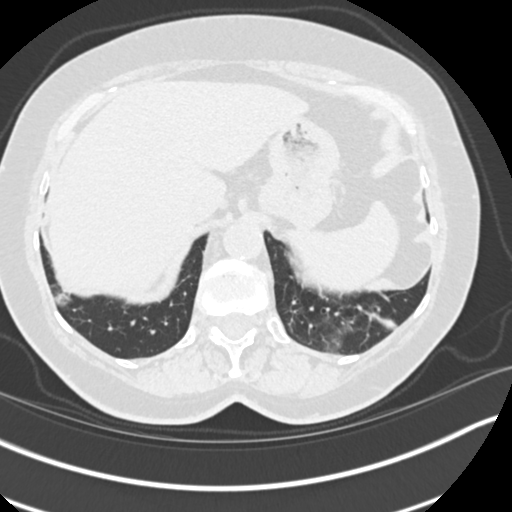
[im 59/190  lung]
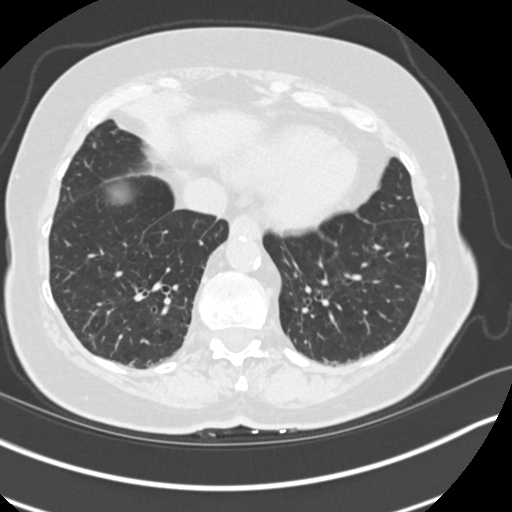
[im 73/190  mediastinal]
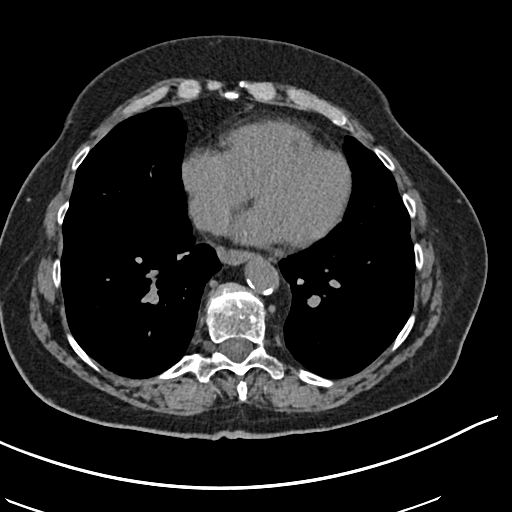
[im 73/190  lung]
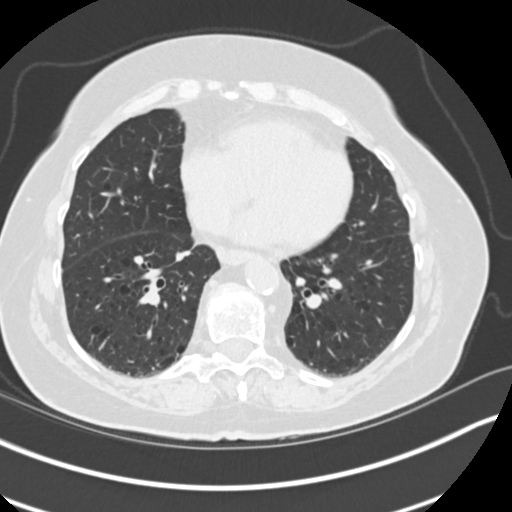
[im 88/190  lung]
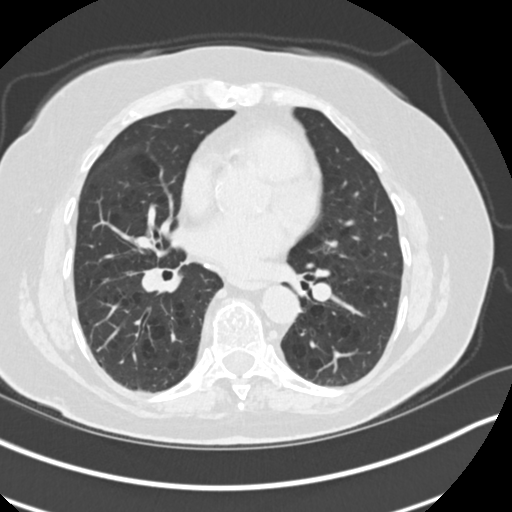
[im 102/190  lung]
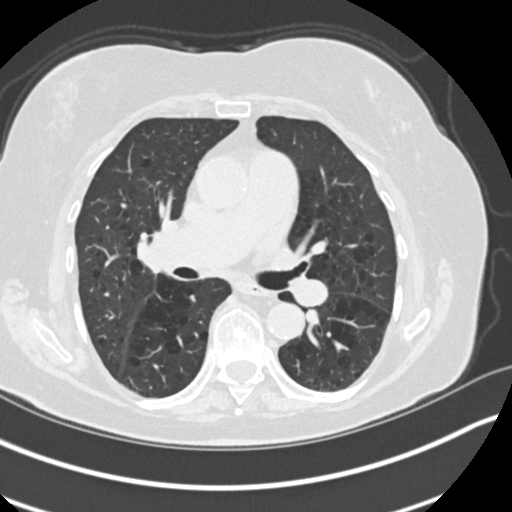
[im 117/190  lung]
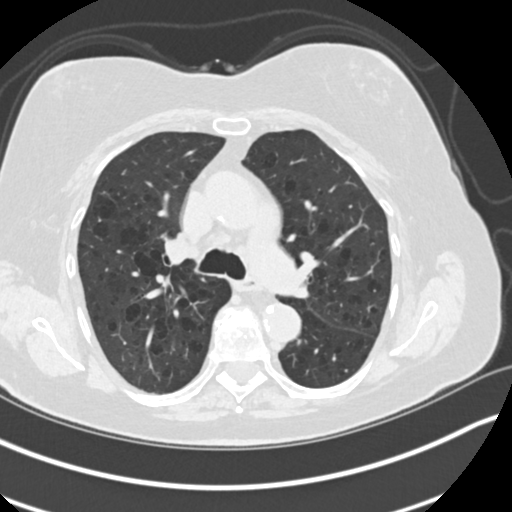
[im 131/190  mediastinal]
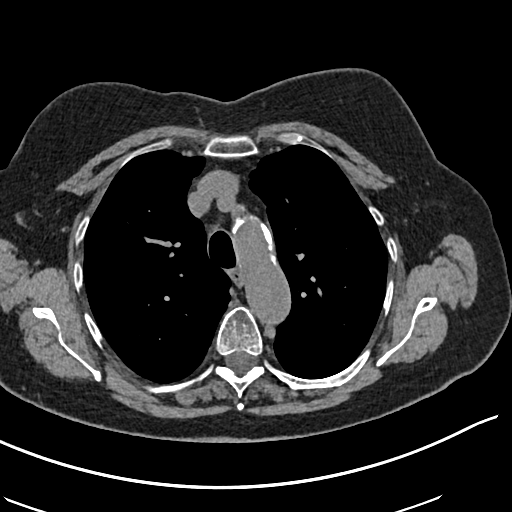
[im 131/190  lung]
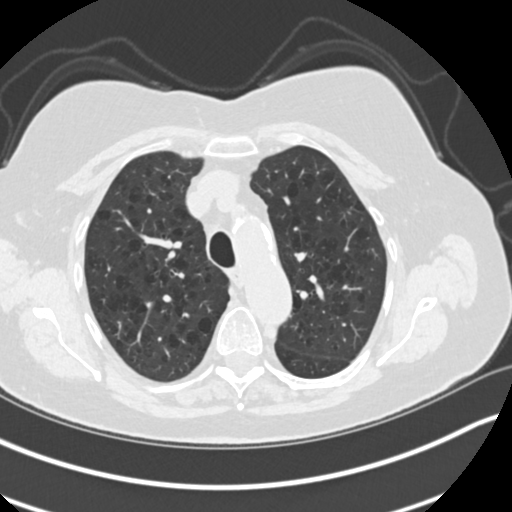
[im 146/190  lung]
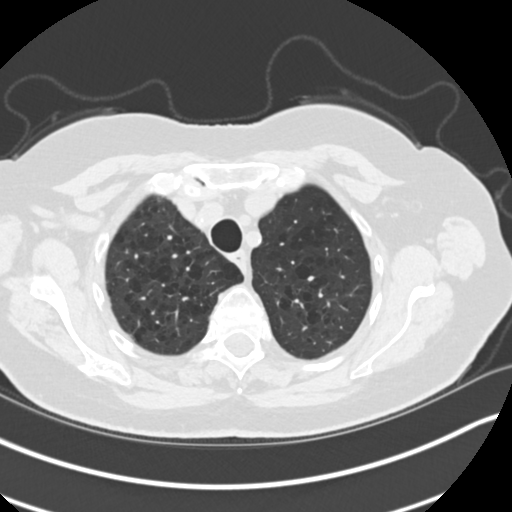
[im 160/190  lung]
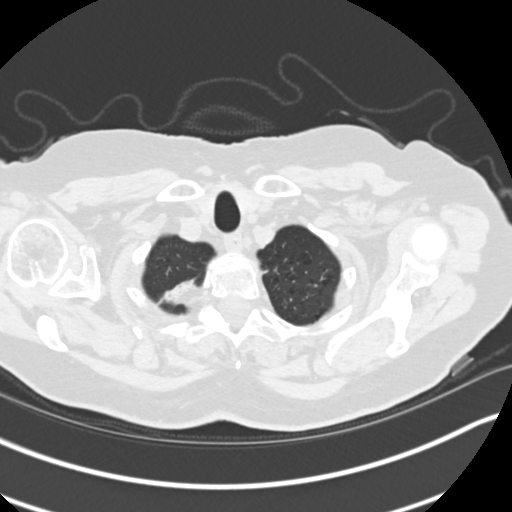
[im 175/190  lung]
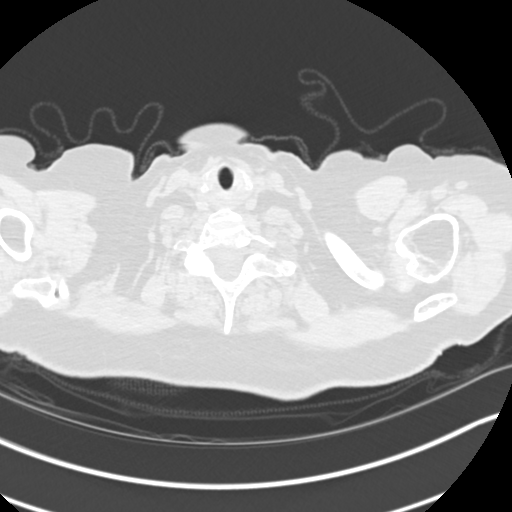

[Series 5: coronals chest 2.00 cor · coronal · 0.67mm/px · 3 of 172 slices shown]
[im 35/172  lung]
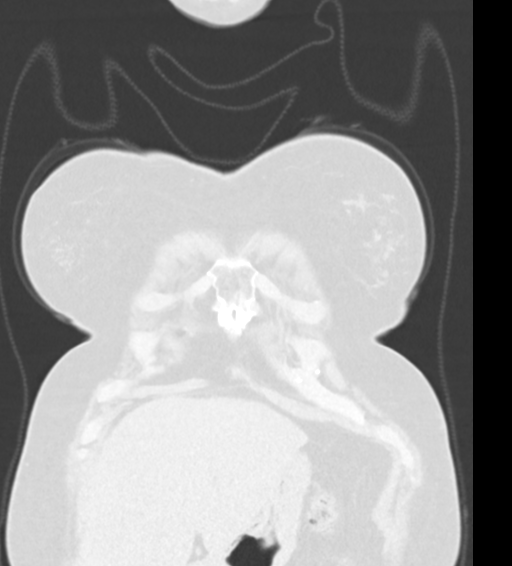
[im 69/172  lung]
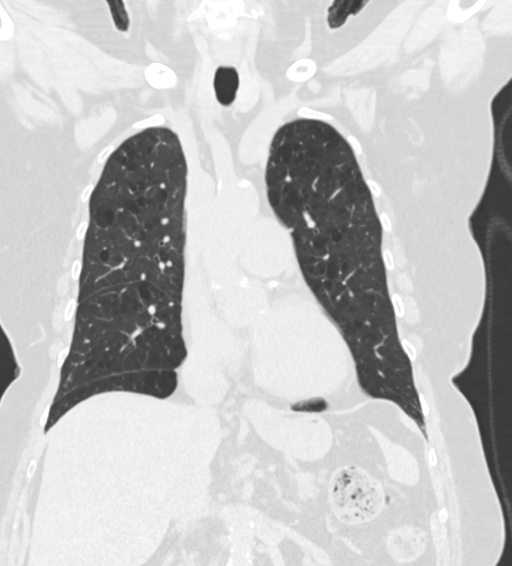
[im 103/172  lung]
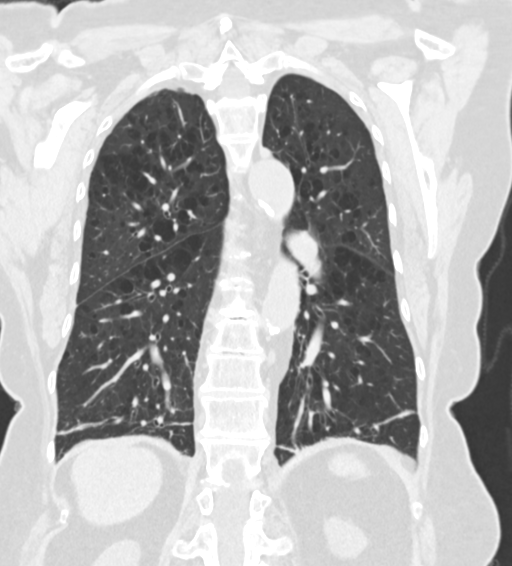

[15 of 36 positions shown; findings below may reference images not displayed]

FINDINGS: Cardiovascular: Unenhanced imaging of the heart and great vessels
demonstrates stable coronary artery atherosclerosis. No pericardial
effusion. Normal caliber of the thoracic aorta, with moderate
atherosclerosis greatest at the aortic arch. Evaluation of the
vascular lumen is limited without IV contrast.

Mediastinum/Nodes: No enlarged mediastinal or axillary lymph nodes.
Thyroid gland, trachea, and esophagus demonstrate no significant
findings.

Lungs/Pleura: Moderate centrilobular emphysema again seen, greatest
in the mid upper lung zones. Stable areas of subsegmental
atelectasis or scarring within the lower lobes. No acute airspace
disease, effusion, or pneumothorax. There is mild bibasilar
cylindrical bronchiectasis. Central airways are patent. No bronchial
wall thickening. Stable right lower lobe calcified granuloma. No
other pulmonary nodules or masses.

Upper Abdomen: Multiple left renal calculi largest measuring 4 mm.
No other acute upper abdominal findings.

Musculoskeletal: No acute or destructive bony lesions. Reconstructed
images demonstrate no additional findings.
IMPRESSION: 1.  Emphysema ([id number]-9DI.3).
2. Mild bibasilar cylindrical bronchiectasis.
3. No acute airspace disease.
4. Incidental nonobstructing sub 4 mm left renal calculi.
5. Aortic Atherosclerosis ([id number].R). Coronary artery
atherosclerosis.

## 2022-05-04 ENCOUNTER — Other Ambulatory Visit: Payer: Self-pay

## 2022-05-04 MED ORDER — METOLAZONE 5 MG PO TABS: 2.5000 mg | ORAL_TABLET | Freq: Every day | ORAL | 1 refills | Status: DC

## 2022-05-04 NOTE — Progress Notes (Signed)
Was this a fasting sample, we can add one tab a metformin 500 mg with supper with it

## 2022-05-05 ENCOUNTER — Other Ambulatory Visit: Payer: Self-pay

## 2022-05-05 DIAGNOSIS — H6691 Otitis media, unspecified, right ear: Secondary | ICD-10-CM | POA: Diagnosis not present

## 2022-05-05 DIAGNOSIS — H903 Sensorineural hearing loss, bilateral: Secondary | ICD-10-CM | POA: Diagnosis not present

## 2022-05-05 DIAGNOSIS — H6531 Chronic mucoid otitis media, right ear: Secondary | ICD-10-CM | POA: Diagnosis not present

## 2022-05-05 DIAGNOSIS — E1165 Type 2 diabetes mellitus with hyperglycemia: Secondary | ICD-10-CM

## 2022-05-05 DIAGNOSIS — H6122 Impacted cerumen, left ear: Secondary | ICD-10-CM | POA: Diagnosis not present

## 2022-05-05 MED ORDER — ONETOUCH VERIO VI STRP: ORAL_STRIP | 3 refills | Status: DC

## 2022-05-05 NOTE — Telephone Encounter (Signed)
SPOKE TO PT AND INFORMED HER TO ADD METFORMIN 500 MG ONCE DAILY AT [facility] TIME.  Pt informed me that the day she went to have the CT scan she checked her BS fasting and it was 137.  She had her labs done and she had drink like 2/3 of water out of a water bottle and her labs came back showing her BS at 184.  Pt is not understanding that it's that big of a difference with her BS numbers.  I advised pt to change her batteries in machine and also check with pharmacy if they have control solutions for that machine if so to let me know and I can send in a prescription. Informed pt that if she is up close to the office next week one day to bring her BS machine and stop in office and we can check her BS compared with our machine.

## 2022-05-08 ENCOUNTER — Telehealth: Payer: Self-pay | Admitting: Nurse Practitioner

## 2022-05-08 NOTE — Telephone Encounter (Signed)
Lvm letting patient know her 05/16/22 appointment has been cancelled since she was not able to have CT done-Toni

## 2022-05-09 ENCOUNTER — Telehealth: Payer: Self-pay

## 2022-05-09 NOTE — Telephone Encounter (Signed)
Pt came to office and we check her BS machine along with the office BS machine and they both were in same numbers.  Pt also advised that she has felt like she is in a cloud like feeling and feels she may need to cut the metformin in half and take 250 mg in the morning and 250 mg in the evening and see if it makes a difference as she has had to take it like that before.pt also brought in a list of her BS readings  05/06/22...830 am 107              1220 pm 125 05/07/22....10 am 103                 1105 pm 127 05/08/22.Marland Kitchen..630 am 123                 323 pm 217 last meal was at 1230-1245 pm  Pt also mentioned that the pharmacy told her do not take metformin and bupropion together  so pt had used together for 3 days and she did feel unsteady on her feet so she stopped the bupropion on 05/07/22.  DFK was informed and will discuss all concerns at her f/u visit on 05/16/22

## 2022-05-15 ENCOUNTER — Other Ambulatory Visit: Payer: Self-pay

## 2022-05-15 ENCOUNTER — Encounter: Payer: Medicare Other | Attending: Internal Medicine

## 2022-05-15 DIAGNOSIS — J9611 Chronic respiratory failure with hypoxia: Secondary | ICD-10-CM | POA: Insufficient documentation

## 2022-05-15 DIAGNOSIS — J449 Chronic obstructive pulmonary disease, unspecified: Secondary | ICD-10-CM | POA: Insufficient documentation

## 2022-05-15 DIAGNOSIS — Z87442 Personal history of urinary calculi: Secondary | ICD-10-CM | POA: Insufficient documentation

## 2022-05-15 DIAGNOSIS — N200 Calculus of kidney: Secondary | ICD-10-CM | POA: Insufficient documentation

## 2022-05-15 NOTE — Progress Notes (Signed)
Virtual Visit completed. Patient informed on EP and RD appointment and 6 Minute walk test. Patient also informed of patient health questionnaires on My Chart. Patient Verbalizes understanding. Visit diagnosis can be found in [facility] 03/13/22.

## 2022-05-16 ENCOUNTER — Ambulatory Visit: Payer: Medicare Other | Admitting: Internal Medicine

## 2022-05-16 ENCOUNTER — Ambulatory Visit: Payer: Medicare Other | Admitting: Nurse Practitioner

## 2022-05-18 ENCOUNTER — Ambulatory Visit (INDEPENDENT_AMBULATORY_CARE_PROVIDER_SITE_OTHER): Payer: Medicare Other | Admitting: Internal Medicine

## 2022-05-18 ENCOUNTER — Encounter: Payer: Self-pay | Admitting: Internal Medicine

## 2022-05-18 DIAGNOSIS — Z9981 Dependence on supplemental oxygen: Secondary | ICD-10-CM

## 2022-05-18 DIAGNOSIS — E1165 Type 2 diabetes mellitus with hyperglycemia: Secondary | ICD-10-CM | POA: Diagnosis not present

## 2022-05-18 DIAGNOSIS — J9611 Chronic respiratory failure with hypoxia: Secondary | ICD-10-CM

## 2022-05-18 DIAGNOSIS — R600 Localized edema: Secondary | ICD-10-CM

## 2022-05-18 DIAGNOSIS — N390 Urinary tract infection, site not specified: Secondary | ICD-10-CM | POA: Diagnosis not present

## 2022-05-18 NOTE — Progress Notes (Signed)
[facility] [place], [place] [address]  Internal MEDICINE  Office Visit Note  Patient Name: Amber Holder  [id number]  [id number]  Date of Service: 05/18/2022  Chief Complaint  Patient presents with   Diabetes   Gastroesophageal Reflux   Follow-up    HPI Pt is here for routine follow up Unable to CT chest w contrast due to contrast allergies, was recommended cardiac MR, need to look into pulmonic stenosis, will reach out to cardiology Stopped Wellbutrin per pharmacist recommendations on her own, does not want to restart feels well it was given to assist with her smoking cessation Diabetic medicine ( farxiga ), her hemoglobin A1c is at target control patient was instructed not to take metformin at this point Kcl 40 meq 2 x aday, Demadex 20 mg Monday Wednesday Friday along with Zaroxolyn 2.5 mg Monday Wednesday Friday Patient is scheduled to have her annual wellness visit tomorrow, labs are ordered Patient continues to be on oxygen, feels better and is more active Bilateral lower extremity edema due to chronic venous stasis wearing compression stockings Chronic UTI    Current Medication: Outpatient Encounter Medications as of 05/18/2022  Medication Sig   acetaminophen (TYLENOL) 500 MG chewable tablet Chew 1,000 mg by mouth every 8 (eight) hours as needed for pain.   albuterol (VENTOLIN HFA) 108 (90 Base) MCG/ACT inhaler Inhale 2 puffs into the lungs every 6 (six) hours as needed for wheezing or shortness of breath.   ALPRAZolam (XANAX) 0.25 MG tablet Take one tab po bid for anxiety prn   aspirin EC 81 MG tablet Take 1 tablet (81 mg total) by mouth daily. Swallow whole.   atorvastatin (LIPITOR) 20 MG tablet Take 1 tablet (20 mg total) by mouth daily.   atorvastatin (LIPITOR) 40 MG tablet Take 40 mg by mouth daily.   buPROPion (WELLBUTRIN XL) 150 MG 24 hr tablet TAKE 1 TABLET BY MOUTH EVERY DAY   cholecalciferol (VITAMIN D) 400 UNITS TABS tablet Take 2,000  Units by mouth daily.    CRANBERRY PO Take 25,000 mg by mouth daily.   dapagliflozin propanediol (FARXIGA) 10 MG TABS tablet Take 1 tablet (10 mg total) by mouth daily before breakfast.   diphenhydrAMINE (BENADRYL) 50 MG tablet Take one tab 13 hrs, 7 hrs and 1 hr before procedure along with prednisone, may take one extra if experienced itching or hives   ergocalciferol (DRISDOL) 1.25 MG (50000 UT) capsule Take one cap q week   fluconazole (DIFLUCAN) 150 MG tablet Take one tab po q week for fungal infection   fluticasone (FLOVENT HFA) 110 MCG/ACT inhaler Inhale 2 puffs into the lungs 2 (two) times daily as needed.   furosemide (LASIX) 20 MG tablet Take by mouth.   glucose blood (ONETOUCH VERIO) test strip BLOOD SUGAR TESTING THREE TIMES DAILY . DX E11.65   hydrocortisone cream 0.5 % Apply topically as needed.    hydrocortisone cream 0.5 % Apply topically.   ibandronate (BONIVA) 150 MG tablet Take 1 tablet (150 mg total) by mouth every 30 (thirty) days.   Lancets (ONETOUCH DELICA PLUS LJQGBE01E) MISC Use  as directed twice a daily DX E11.65   Magnesium 250 MG TABS Take by mouth. Takes 1 tablet 2-3 times per week   metFORMIN (GLUCOPHAGE) 500 MG tablet Take 500 mg by mouth daily. with dinner   metolazone (ZAROXOLYN) 5 MG tablet Take 0.5 tablets (2.5 mg total) by mouth daily. Take 30 mins prior to your Torsemide.   montelukast (SINGULAIR) 10  MG tablet Take 10 mg by mouth daily as needed.   Multiple Vitamins-Minerals (MULTIVITAMIN ADULTS PO) Take 1 tablet by mouth daily.   nitrofurantoin, macrocrystal-monohydrate, (MACROBID) 100 MG capsule Take 1 capsule (100 mg total) by mouth 2 (two) times daily. Use as instructed   omeprazole (PRILOSEC) 40 MG capsule Take 1 capsule (40 mg total) by mouth daily.   OXYGEN Inhale 3 L into the lungs. Pt uses [facility] for Oxygen   potassium chloride SA (KLOR-CON M) 20 MEQ tablet TAKE 2 TABS IN AM AND ONE TAB AT [facility] AND THEN MAY TAKE ONE EXTRA PER MD DIRECTION    predniSONE & diphenhydrAMINE (CONTRAST ALLERGY PREMED PACK) 3 x 50 MG & 1 x 50 MG KIT Take 50 mg by mouth Once PRN for up to 1 dose (TAKE 13 hour prior to CT angio).   predniSONE (DELTASONE) 50 MG tablet Take one tab 13 hrs, 7 hrs and 1 hr before scheduled time along with Benadryl, may take one extra after the procedure if experienced itching and hives   Simethicone 80 MG TABS Take 1 tablet (80 mg total) by mouth as needed (burping). As directed.   Tiotropium Bromide-Olodaterol 2.5-2.5 MCG/ACT AERS Inhale 2 puffs into the lungs daily.   torsemide (DEMADEX) 20 MG tablet Take 1 tablet (20 mg total) by mouth daily.   No facility-administered encounter medications on file as of 05/18/2022.    Surgical History: Past Surgical History:  Procedure Laterality Date   APPENDECTOMY     cataract surgery Bilateral    CHOLECYSTECTOMY     COLONOSCOPY     COLONOSCOPY WITH PROPOFOL N/A 05/28/2015   Procedure: COLONOSCOPY WITH PROPOFOL;  Surgeon: Lollie Sails, MD;  Location: [facility] ENDOSCOPY;  Service: Endoscopy;  Laterality: N/A;   COLONOSCOPY WITH PROPOFOL N/A 10/28/2018   Procedure: COLONOSCOPY WITH PROPOFOL;  Surgeon: Lollie Sails, MD;  Location: [facility] ENDOSCOPY;  Service: Endoscopy;  Laterality: N/A;   EYE SURGERY Bilateral 2015   Cataract Extraction with IOL   LITHOTRIPSY  2015   has had many stones   Mastoidotomy  1970   ROTATOR CUFF REPAIR Right 2011   TOTAL HIP ARTHROPLASTY Right 06/14/2016   Procedure: TOTAL HIP ARTHROPLASTY;  Surgeon: Dereck Leep, MD;  Location: [facility] ORS;  Service: Orthopedics;  Laterality: Right;    Medical History: Past Medical History:  Diagnosis Date   Anxiety 01/02/2012   Arthritis    Asthma    Blockage of coronary artery of heart (HCC)    Chronic kidney disease    Kidney stones   Colon polyps    COPD (chronic obstructive pulmonary disease) (HCC)    Diabetes mellitus without complication (HCC)    GERD (gastroesophageal reflux disease)    HOH (hard of  hearing)    Bilateral hearing aids   Kidney stone    Osteoporosis    Sleep apnea    Does not use C-PAP on a regular basis   Venous stasis     Family History: Family History  Problem Relation Age of Onset   Breast cancer Maternal Aunt 91   Bladder Cancer Neg Hx    Kidney cancer Neg Hx     Social History   Socioeconomic History   Marital status: Divorced    Spouse name: Not on file   Number of children: Not on file   Years of education: Not on file   Highest education level: Not on file  Occupational History   Not on file  Tobacco  Use   Smoking status: Former    Packs/day: 1.00    Years: 50.00    Total pack years: 50.00    Types: Cigarettes    Quit date: 12/28/2021    Years since quitting: 0.3    Passive exposure: Never   Smokeless tobacco: Never   Tobacco comments:    1 pack daily//quit 12/28/2021  Vaping Use   Vaping Use: Never used  Substance and Sexual Activity   Alcohol use: Not Currently    Comment: very rarely    Drug use: No   Sexual activity: Not Currently  Other Topics Concern   Not on file  Social History Narrative   Not on file   Social Determinants of Health   Financial Resource Strain: Not on file  Food Insecurity: Not on file  Transportation Needs: Not on file  Physical Activity: Not on file  Stress: Not on file  Social Connections: Not on file  Intimate Partner Violence: Not on file      Review of Systems  Constitutional:  Negative for chills, fatigue and unexpected weight change.  HENT:  Negative for congestion, postnasal drip, rhinorrhea, sneezing and sore throat.   Eyes:  Negative for redness.  Respiratory:  Negative for cough, chest tightness and shortness of breath.   Cardiovascular:  Positive for leg swelling. Negative for chest pain and palpitations.  Gastrointestinal:  Negative for abdominal pain, constipation, diarrhea, nausea and vomiting.  Genitourinary:  Negative for dysuria and frequency.  Musculoskeletal:  Negative for  arthralgias, back pain, joint swelling and neck pain.  Skin:  Negative for rash.  Neurological: Negative.  Negative for tremors and numbness.  Hematological:  Negative for adenopathy. Does not bruise/bleed easily.  Psychiatric/Behavioral:  Negative for behavioral problems (Depression), sleep disturbance and suicidal ideas. The patient is not nervous/anxious.     Vital Signs: BP 118/60   Pulse 67   Temp 98 F (36.7 C)   Resp 16   Ht 5' 6"  (1.676 m)   Wt 186 lb (84.4 kg)   SpO2 92%   BMI 30.02 kg/m    Physical Exam Constitutional:      Appearance: Normal appearance.  HENT:     Head: Normocephalic and atraumatic.     Nose: Nose normal.     Mouth/Throat:     Mouth: Mucous membranes are moist.     Pharynx: No posterior oropharyngeal erythema.  Eyes:     Extraocular Movements: Extraocular movements intact.     Pupils: Pupils are equal, round, and reactive to light.  Cardiovascular:     Pulses: Normal pulses.     Heart sounds: Normal heart sounds.  Pulmonary:     Effort: Pulmonary effort is normal.     Breath sounds: Normal breath sounds.  Musculoskeletal:     Right lower leg: Edema present.     Left lower leg: Edema present.  Neurological:     General: No focal deficit present.     Mental Status: She is alert.  Psychiatric:        Mood and Affect: Mood normal.        Behavior: Behavior normal.        Assessment/Plan: 1. Chronic respiratory failure with hypoxia (HCC) Encouraged patient to continue on oxygen will speak with cardiology to get cardiac MR to look for interval pulmonic stenosis patient is highly allergic to contrast - CBC with Differential/Platelet - Lipid Panel With LDL/HDL Ratio - TSH - T4, free - Comprehensive metabolic panel  2. Type  2 diabetes mellitus with hyperglycemia, without long-term current use of insulin ([place]) Continue Farxiga as before 10 mg once a day encouraged her to stick with 1800 diabetic diet - CBC with Differential/Platelet -  Lipid Panel With LDL/HDL Ratio - TSH - T4, free - Comprehensive metabolic panel  3. Bilateral lower extremity edema Bilateral lower extremity edema wears compressions stockings of followed by vascular wants to postpone the surgery until her condition is diagnosed, will continue on Demadex 20 mg Monday Wednesday Friday along with Zaroxolyn 2.5 mg Monday Wednesday Friday, we will recheck her potassium level as well since patient is very sensitive to diuretics and has been taking high-dose potassium - CBC with Differential/Platelet - Lipid Panel With LDL/HDL Ratio - TSH - T4, free - Comprehensive metabolic panel  4. Oxygen dependent We will continue oxygen tolerating well - CBC with Differential/Platelet - Lipid Panel With LDL/HDL Ratio - TSH - T4, free - Comprehensive metabolic panel  5. Chronic UTI We will recheck her urine patient has been treated with antibiotics 2 times - UA/M w/rflx Culture, Routine - CBC with Differential/Platelet - Lipid Panel With LDL/HDL Ratio - TSH - T4, free - Comprehensive metabolic panel Medical  General Counseling: Yeira verbalizes understanding of the findings of todays visit and agrees with plan of treatment. I have discussed any further diagnostic evaluation that may be needed or ordered today. We also reviewed her medications today. she has been encouraged to call the office with any questions or concerns that should arise related to todays visit.    Orders Placed This Encounter  Procedures   UA/M w/rflx Culture, Routine   CBC with Differential/Platelet   Lipid Panel With LDL/HDL Ratio   TSH   T4, free   Comprehensive metabolic panel    No orders of the defined types were placed in this encounter.   Total time spent:35 Minutes Time spent includes review of chart, medications, test results, and follow up plan with the patient.   [place] Controlled Substance Database was reviewed by me.   Dr Lavera Guise Internal medicine

## 2022-05-19 ENCOUNTER — Other Ambulatory Visit
Admission: RE | Admit: 2022-05-19 | Discharge: 2022-05-19 | Disposition: A | Discharge status: Home / Self Care | Payer: Medicare Other | Source: Ambulatory Visit | Attending: Internal Medicine | Admitting: Internal Medicine

## 2022-05-19 ENCOUNTER — Ambulatory Visit (INDEPENDENT_AMBULATORY_CARE_PROVIDER_SITE_OTHER): Payer: Medicare Other | Admitting: Physician Assistant

## 2022-05-19 ENCOUNTER — Encounter: Payer: Self-pay | Admitting: Physician Assistant

## 2022-05-19 VITALS — BP 133/67 | HR 70 | Temp 97.8°F | Resp 16 | Ht 66.0 in | Wt 185.4 lb

## 2022-05-19 DIAGNOSIS — R600 Localized edema: Secondary | ICD-10-CM | POA: Diagnosis not present

## 2022-05-19 DIAGNOSIS — R300 Dysuria: Secondary | ICD-10-CM | POA: Insufficient documentation

## 2022-05-19 DIAGNOSIS — Z0001 Encounter for general adult medical examination with abnormal findings: Secondary | ICD-10-CM | POA: Diagnosis not present

## 2022-05-19 DIAGNOSIS — Z9981 Dependence on supplemental oxygen: Secondary | ICD-10-CM | POA: Insufficient documentation

## 2022-05-19 DIAGNOSIS — J9611 Chronic respiratory failure with hypoxia: Secondary | ICD-10-CM | POA: Insufficient documentation

## 2022-05-19 DIAGNOSIS — E1165 Type 2 diabetes mellitus with hyperglycemia: Secondary | ICD-10-CM | POA: Insufficient documentation

## 2022-05-19 LAB — COMPREHENSIVE METABOLIC PANEL
ALT: 20 U/L (ref 0–44)
AST: 23 U/L (ref 15–41)
Albumin: 4.1 g/dL (ref 3.5–5.0)
Alkaline Phosphatase: 58 U/L (ref 38–126)
Anion gap: 13 (ref 5–15)
BUN: 27 mg/dL — ABNORMAL HIGH (ref 8–23)
CO2: 27 mmol/L (ref 22–32)
Calcium: 9.7 mg/dL (ref 8.9–10.3)
Chloride: 99 mmol/L (ref 98–111)
Creatinine, Ser: 0.77 mg/dL (ref 0.44–1.00)
GFR, Estimated: >60 mL/min (ref >60)
Glucose, Bld: 137 mg/dL — ABNORMAL HIGH (ref 70–99)
Potassium: 3.3 mmol/L — ABNORMAL LOW (ref 3.5–5.1)
Sodium: 139 mmol/L (ref 135–145)
Total Bilirubin: 0.9 mg/dL (ref 0.3–1.2)
Total Protein: 7.6 g/dL (ref 6.5–8.1)

## 2022-05-19 LAB — LIPID PANEL
Cholesterol: 162 mg/dL (ref 0–200)
HDL: 48 mg/dL (ref >40)
LDL Cholesterol: 92 mg/dL (ref 0–99)
Total CHOL/HDL Ratio: 3.4 RATIO
Triglycerides: 111 mg/dL (ref <150)
VLDL: 22 mg/dL (ref 0–40)

## 2022-05-19 LAB — CBC WITH DIFFERENTIAL/PLATELET
Abs Immature Granulocytes: 0.02 10*3/uL (ref 0.00–0.07)
Basophils Absolute: 0.1 10*3/uL (ref 0.0–0.1)
Basophils Relative: 1 %
Eosinophils Absolute: 0.1 10*3/uL (ref 0.0–0.5)
Eosinophils Relative: 1 %
HCT: 42.9 % (ref 36.0–46.0)
Hemoglobin: 14.4 g/dL (ref 12.0–15.0)
Immature Granulocytes: 0 %
Lymphocytes Relative: 40 %
Lymphs Abs: 3.6 10*3/uL (ref 0.7–4.0)
MCH: 29.5 pg (ref 26.0–34.0)
MCHC: 33.6 g/dL (ref 30.0–36.0)
MCV: 87.9 fL (ref 80.0–100.0)
Monocytes Absolute: 0.7 10*3/uL (ref 0.1–1.0)
Monocytes Relative: 8 %
Neutro Abs: 4.4 10*3/uL (ref 1.7–7.7)
Neutrophils Relative %: 50 %
Platelets: 237 10*3/uL (ref 150–400)
RBC: 4.88 MIL/uL (ref 3.87–5.11)
RDW: 12 % (ref 11.5–15.5)
WBC: 8.8 10*3/uL (ref 4.0–10.5)
nRBC: 0 % (ref 0.0–0.2)

## 2022-05-19 LAB — T4, FREE: Free T4: 0.81 ng/dL (ref 0.61–1.12)

## 2022-05-19 LAB — TSH: TSH: 1.527 u[IU]/mL (ref 0.350–4.500)

## 2022-05-19 NOTE — Progress Notes (Unsigned)
[id number] 

## 2022-05-19 NOTE — Progress Notes (Unsigned)
[facility] [place],  [id number]  Internal MEDICINE  Office Visit Note  Patient Name: Amber Holder  [id number]  [id number]  Date of Service: 05/24/2022  Chief Complaint  Patient presents with   Medicare Wellness   Diabetes     HPI Pt is here for routine health maintenance examination -Foot exam will be done today -Wearing compression stockings. Was able to take off for foot exam. Doesn't walk barefoot. Swelling improved with compression and taking her diuretics -Breathing improved on her oxygen -Has stopped smoking since May 4th, 2023 and was applauded for this -Will go for labs previously ordered after visit today -Eye exam in Jan -She is UTD on PHM  Current Medication: Outpatient Encounter Medications as of 05/19/2022  Medication Sig   acetaminophen (TYLENOL) 500 MG chewable tablet Chew 1,000 mg by mouth every 8 (eight) hours as needed for pain.   albuterol (VENTOLIN HFA) 108 (90 Base) MCG/ACT inhaler Inhale 2 puffs into the lungs every 6 (six) hours as needed for wheezing or shortness of breath.   ALPRAZolam (XANAX) 0.25 MG tablet Take one tab po bid for anxiety prn   aspirin EC 81 MG tablet Take 1 tablet (81 mg total) by mouth daily. Swallow whole.   atorvastatin (LIPITOR) 20 MG tablet Take 1 tablet (20 mg total) by mouth daily.   atorvastatin (LIPITOR) 40 MG tablet Take 40 mg by mouth daily.   buPROPion (WELLBUTRIN XL) 150 MG 24 hr tablet TAKE 1 TABLET BY MOUTH EVERY DAY   cholecalciferol (VITAMIN D) 400 UNITS TABS tablet Take 2,000 Units by mouth daily.    CRANBERRY PO Take 25,000 mg by mouth daily.   dapagliflozin propanediol (FARXIGA) 10 MG TABS tablet Take 1 tablet (10 mg total) by mouth daily before breakfast.   diphenhydrAMINE (BENADRYL) 50 MG tablet Take one tab 13 hrs, 7 hrs and 1 hr before procedure along with prednisone, may take one extra if experienced itching or hives   ergocalciferol (DRISDOL) 1.25 MG (50000 UT) capsule Take  one cap q week   fluconazole (DIFLUCAN) 150 MG tablet Take one tab po q week for fungal infection   fluticasone (FLOVENT HFA) 110 MCG/ACT inhaler Inhale 2 puffs into the lungs 2 (two) times daily as needed.   furosemide (LASIX) 20 MG tablet Take by mouth.   glucose blood (ONETOUCH VERIO) test strip BLOOD SUGAR TESTING THREE TIMES DAILY . DX E11.65   hydrocortisone cream 0.5 % Apply topically as needed.    hydrocortisone cream 0.5 % Apply topically.   ibandronate (BONIVA) 150 MG tablet Take 1 tablet (150 mg total) by mouth every 30 (thirty) days.   Lancets (ONETOUCH DELICA PLUS CBULAG53M) MISC Use  as directed twice a daily DX E11.65   Magnesium 250 MG TABS Take by mouth. Takes 1 tablet 2-3 times per week   metFORMIN (GLUCOPHAGE) 500 MG tablet Take 500 mg by mouth daily. with dinner   metolazone (ZAROXOLYN) 5 MG tablet Take 0.5 tablets (2.5 mg total) by mouth daily. Take 30 mins prior to your Torsemide.   montelukast (SINGULAIR) 10 MG tablet Take 10 mg by mouth daily as needed.   Multiple Vitamins-Minerals (MULTIVITAMIN ADULTS PO) Take 1 tablet by mouth daily.   nitrofurantoin, macrocrystal-monohydrate, (MACROBID) 100 MG capsule Take 1 capsule (100 mg total) by mouth 2 (two) times daily. Use as instructed   omeprazole (PRILOSEC) 40 MG capsule Take 1 capsule (40 mg total) by mouth daily.   OXYGEN Inhale 3 L into the  lungs. Pt uses [facility] for Oxygen   potassium chloride SA (KLOR-CON M) 20 MEQ tablet TAKE 2 TABS IN AM AND ONE TAB AT [facility] AND THEN MAY TAKE ONE EXTRA PER MD DIRECTION   predniSONE & diphenhydrAMINE (CONTRAST ALLERGY PREMED PACK) 3 x 50 MG & 1 x 50 MG KIT Take 50 mg by mouth Once PRN for up to 1 dose (TAKE 13 hour prior to CT angio).   predniSONE (DELTASONE) 50 MG tablet Take one tab 13 hrs, 7 hrs and 1 hr before scheduled time along with Benadryl, may take one extra after the procedure if experienced itching and hives   Simethicone 80 MG TABS Take 1 tablet (80 mg total) by  mouth as needed (burping). As directed.   Tiotropium Bromide-Olodaterol 2.5-2.5 MCG/ACT AERS Inhale 2 puffs into the lungs daily.   torsemide (DEMADEX) 20 MG tablet Take 1 tablet (20 mg total) by mouth daily.   No facility-administered encounter medications on file as of 05/19/2022.    Surgical History: Past Surgical History:  Procedure Laterality Date   APPENDECTOMY     cataract surgery Bilateral    CHOLECYSTECTOMY     COLONOSCOPY     COLONOSCOPY WITH PROPOFOL N/A 05/28/2015   Procedure: COLONOSCOPY WITH PROPOFOL;  Surgeon: Lollie Sails, MD;  Location: [facility] ENDOSCOPY;  Service: Endoscopy;  Laterality: N/A;   COLONOSCOPY WITH PROPOFOL N/A 10/28/2018   Procedure: COLONOSCOPY WITH PROPOFOL;  Surgeon: Lollie Sails, MD;  Location: [facility] ENDOSCOPY;  Service: Endoscopy;  Laterality: N/A;   EYE SURGERY Bilateral 2015   Cataract Extraction with IOL   LITHOTRIPSY  2015   has had many stones   Mastoidotomy  1970   ROTATOR CUFF REPAIR Right 2011   TOTAL HIP ARTHROPLASTY Right 06/14/2016   Procedure: TOTAL HIP ARTHROPLASTY;  Surgeon: Dereck Leep, MD;  Location: [facility] ORS;  Service: Orthopedics;  Laterality: Right;    Medical History: Past Medical History:  Diagnosis Date   Anxiety 01/02/2012   Arthritis    Asthma    Blockage of coronary artery of heart (HCC)    Chronic kidney disease    Kidney stones   Colon polyps    COPD (chronic obstructive pulmonary disease) (HCC)    Diabetes mellitus without complication (HCC)    GERD (gastroesophageal reflux disease)    HOH (hard of hearing)    Bilateral hearing aids   Kidney stone    Osteoporosis    Sleep apnea    Does not use C-PAP on a regular basis   Venous stasis     Family History: Family History  Problem Relation Age of Onset   Breast cancer Maternal Aunt 50   Bladder Cancer Neg Hx    Kidney cancer Neg Hx       Review of Systems  Constitutional:  Negative for chills, fatigue and unexpected weight change.  HENT:   Negative for congestion, postnasal drip, rhinorrhea, sneezing and sore throat.   Eyes:  Negative for redness.  Respiratory:  Negative for cough, chest tightness and shortness of breath.   Cardiovascular:  Positive for leg swelling. Negative for chest pain and palpitations.  Gastrointestinal:  Negative for abdominal pain, constipation, diarrhea, nausea and vomiting.  Genitourinary:  Negative for dysuria and frequency.  Musculoskeletal:  Negative for arthralgias, back pain, joint swelling and neck pain.  Skin:  Negative for rash.  Neurological: Negative.  Negative for tremors and numbness.  Hematological:  Negative for adenopathy. Does not bruise/bleed easily.  Psychiatric/Behavioral:  Negative for behavioral problems (Depression), sleep disturbance and suicidal ideas. The patient is not nervous/anxious.      Vital Signs: BP 133/67   Pulse 70   Temp 97.8 F (36.6 C)   Resp 16   Ht _0  (1.676 m)   Wt 185 lb 6.4 oz (84.1 kg)   SpO2 95% Comment: 3 litre  BMI 29.92 kg/m    Physical Exam Vitals and nursing note reviewed.  Constitutional:      Appearance: Normal appearance.  HENT:     Head: Normocephalic and atraumatic.     Nose: Nose normal.     Mouth/Throat:     Mouth: Mucous membranes are moist.     Pharynx: No posterior oropharyngeal erythema.  Eyes:     Extraocular Movements: Extraocular movements intact.     Pupils: Pupils are equal, round, and reactive to light.  Cardiovascular:     Rate and Rhythm: Normal rate and regular rhythm.     Pulses: Normal pulses.     Heart sounds: Normal heart sounds.  Pulmonary:     Effort: Pulmonary effort is normal.     Breath sounds: Normal breath sounds.  Abdominal:     General: Abdomen is flat. Bowel sounds are normal.     Palpations: Abdomen is soft.     Tenderness: There is no abdominal tenderness.  Musculoskeletal:     Right lower leg: Edema present.     Left lower leg: Edema present.     Right foot: Normal range of motion.      Left foot: Normal range of motion.  Feet:     Right foot:     Protective Sensation: 2 sites tested.  2 sites sensed.     Skin integrity: Skin integrity normal.     Toenail Condition: Right toenails are normal.     Left foot:     Protective Sensation: 2 sites tested.  2 sites sensed.     Skin integrity: Skin integrity normal.     Toenail Condition: Left toenails are normal.  Skin:    General: Skin is warm and dry.  Neurological:     General: No focal deficit present.     Mental Status: She is alert.  Psychiatric:        Mood and Affect: Mood normal.        Behavior: Behavior normal.      LABS: Recent Results (from the past 2160 hour(s))  POCT Urinalysis Dipstick     Status: Abnormal   Collection Time: 02/27/22  9:30 AM  Result Value Ref Range   Color, UA     Clarity, UA     Glucose, UA Positive (A) Negative    Comment: 2000 +   Bilirubin, UA neg    Ketones, UA neg    Spec Grav, UA 1.015 1.010 - 1.025   Blood, UA moderate    pH, UA 6.0 5.0 - 8.0   Protein, UA Positive (A) Negative    Comment: trace   Urobilinogen, UA 0.2 0.2 or 1.0 E.U./dL   Nitrite, UA neg    Leukocytes, UA Moderate (2+) (A) Negative   Appearance     Odor    Urine Microalbumin w/creat. ratio     Status: Abnormal   Collection Time: 02/27/22  9:51 AM  Result Value Ref Range   Creatinine, Urine 78.4 Not Estab. mg/dL   Microalbumin, Urine 42.0 Not Estab. ug/mL   Microalb/Creat Ratio 54 (H) 0 - 29 mg/g creat  Comment:                        Normal:                0 -  29                        Moderately increased: 30 - 300                        Severely increased:       >300   CULTURE, URINE COMPREHENSIVE     Status: Abnormal   Collection Time: 02/27/22  9:51 AM   Specimen: Urine   Urine  Result Value Ref Range   Urine Culture, Comprehensive Final report (A)    Organism ID, Bacteria Klebsiella pneumoniae (A)     Comment: Cefazolin <=4 ug/mL Cefazolin with an MIC <=16 predicts  susceptibility to the oral agents cefaclor, cefdinir, cefpodoxime, cefprozil, cefuroxime, cephalexin, and loracarbef when used for therapy of uncomplicated urinary tract infections due to E. coli, Klebsiella pneumoniae, and Proteus mirabilis. Greater than 100,000 colony forming units per mL    ANTIMICROBIAL SUSCEPTIBILITY Comment     Comment:       ** S = Susceptible; I = Intermediate; R = Resistant **                    P = Positive; N = Negative             MICS are expressed in micrograms per mL    Antibiotic                 RSLT#1    RSLT#2    RSLT#3    RSLT#4 Amoxicillin/Clavulanic Acid    S Ampicillin                     R Cefepime                       S Ceftriaxone                    S Cefuroxime                     S Ciprofloxacin                  S Ertapenem                      S Gentamicin                     S Imipenem                       S Levofloxacin                   S Meropenem                      S Nitrofurantoin                 S Piperacillin/Tazobactam        S Tetracycline                   S Tobramycin  S Trimethoprim/Sulfa             S   POCT Urinalysis Dipstick     Status: Abnormal   Collection Time: 02/27/22 11:33 AM  Result Value Ref Range   Color, UA     Clarity, UA     Glucose, UA Positive (A) Negative    Comment: 2000 +   Bilirubin, UA neg    Ketones, UA neg    Spec Grav, UA 1.015 1.010 - 1.025   Blood, UA moderate    pH, UA 6.0 5.0 - 8.0   Protein, UA Positive (A) Negative    Comment: trace   Urobilinogen, UA 0.2 0.2 or 1.0 E.U./dL   Nitrite, UA neg    Leukocytes, UA Moderate (2+) (A) Negative   Appearance     Odor    Basic metabolic panel     Status: Abnormal   Collection Time: 03/08/22 11:54 AM  Result Value Ref Range   Sodium 136 135 - 145 mmol/L   Potassium 2.4 (LL) 3.5 - 5.1 mmol/L    Comment: CRITICAL RESULT CALLED TO, READ BACK BY AND VERIFIED WITH DESHA S 1406 03/08/22 HNM    Chloride 91 (L) 98 - 111  mmol/L   CO2 31 22 - 32 mmol/L   Glucose, Bld 219 (H) 70 - 99 mg/dL    Comment: Glucose reference range applies only to samples taken after fasting for at least 8 hours.   BUN 42 (H) 8 - 23 mg/dL   Creatinine, Ser 1.23 (H) 0.44 - 1.00 mg/dL   Calcium 9.5 8.9 - 10.3 mg/dL   GFR, Estimated 46 (L) >60 mL/min    Comment: (NOTE) Calculated using the CKD-EPI Creatinine Equation (2021)    Anion gap 14 5 - 15    Comment: Performed at [facility], [address] [place], [place] [address]  Basic metabolic panel     Status: Abnormal   Collection Time: 03/13/22  7:52 AM  Result Value Ref Range   Sodium 135 135 - 145 mmol/L   Potassium 3.3 (L) 3.5 - 5.1 mmol/L   Chloride 100 98 - 111 mmol/L   CO2 25 22 - 32 mmol/L   Glucose, Bld 151 (H) 70 - 99 mg/dL    Comment: Glucose reference range applies only to samples taken after fasting for at least 8 hours.   BUN 37 (H) 8 - 23 mg/dL   Creatinine, Ser 1.08 (H) 0.44 - 1.00 mg/dL   Calcium 9.0 8.9 - 10.3 mg/dL   GFR, Estimated 53 (L) >60 mL/min    Comment: (NOTE) Calculated using the CKD-EPI Creatinine Equation (2021)    Anion gap 10 5 - 15    Comment: Performed at [facility], [address] [place], [place] [address]  Basic metabolic panel     Status: Abnormal   Collection Time: 03/21/22 12:28 PM  Result Value Ref Range   Sodium 139 135 - 145 mmol/L   Potassium 4.4 3.5 - 5.1 mmol/L   Chloride 107 98 - 111 mmol/L   CO2 23 22 - 32 mmol/L   Glucose, Bld 121 (H) 70 - 99 mg/dL    Comment: Glucose reference range applies only to samples taken after fasting for at least 8 hours.   BUN 24 (H) 8 - 23 mg/dL   Creatinine, Ser 0.85 0.44 - 1.00 mg/dL   Calcium 9.2 8.9 - 10.3 mg/dL   GFR, Estimated >60 >60 mL/min    Comment: (NOTE) Calculated  using the CKD-EPI Creatinine Equation (2021)    Anion gap 9 5 - 15    Comment: Performed at [facility], [address] [place], [place] [address]  POCT Urinalysis Dipstick      Status: Abnormal   Collection Time: 03/27/22  9:03 AM  Result Value Ref Range   Color, UA     Clarity, UA     Glucose, UA Positive (A) Negative   Bilirubin, UA Negative    Ketones, UA Negative    Spec Grav, UA 1.010 1.010 - 1.025   Blood, UA Negative    pH, UA 6.0 5.0 - 8.0   Protein, UA Negative Negative   Urobilinogen, UA 0.2 0.2 or 1.0 E.U./dL   Nitrite, UA Negative    Leukocytes, UA Negative Negative   Appearance     Odor    Pulmonary Function Test     Status: None   Collection Time: 04/17/22  3:33 PM  Result Value Ref Range   FEV1     FVC     FEV1/FVC     TLC     DLCO    Basic metabolic panel     Status: Abnormal   Collection Time: 04/20/22 11:25 AM  Result Value Ref Range   Sodium 137 135 - 145 mmol/L   Potassium 3.1 (L) 3.5 - 5.1 mmol/L   Chloride 99 98 - 111 mmol/L   CO2 26 22 - 32 mmol/L   Glucose, Bld 132 (H) 70 - 99 mg/dL    Comment: Glucose reference range applies only to samples taken after fasting for at least 8 hours.   BUN 23 8 - 23 mg/dL   Creatinine, Ser 0.89 0.44 - 1.00 mg/dL   Calcium 9.8 8.9 - 10.3 mg/dL   GFR, Estimated >60 >60 mL/min    Comment: (NOTE) Calculated using the CKD-EPI Creatinine Equation (2021)    Anion gap 12 5 - 15    Comment: Performed at [facility], [address] [place], [place] [address]  POCT glycosylated hemoglobin (Hb A1C)     Status: Abnormal   Collection Time: 04/24/22 10:10 AM  Result Value Ref Range   Hemoglobin A1C 6.2 (A) 4.0 - 5.6 %   HbA1c POC (<> result, manual entry)     HbA1c, POC (prediabetic range)     HbA1c, POC (controlled diabetic range)    POCT Urinalysis Dipstick     Status: Abnormal   Collection Time: 04/24/22 10:32 AM  Result Value Ref Range   Color, UA     Clarity, UA     Glucose, UA Positive (A) Negative   Bilirubin, UA neg    Ketones, UA trace    Spec Grav, UA 1.010 1.010 - 1.025   Blood, UA trace    pH, UA 6.5 5.0 - 8.0   Protein, UA Negative Negative   Urobilinogen, UA 0.2  0.2 or 1.0 E.U./dL   Nitrite, UA neg    Leukocytes, UA Negative Negative   Appearance     Odor    CULTURE, URINE COMPREHENSIVE     Status: None   Collection Time: 04/24/22 10:39 AM   Specimen: Urine   Urine  Result Value Ref Range   Urine Culture, Comprehensive Final report    Organism ID, Bacteria Comment     Comment: No growth in 36 - 48 hours.  Basic metabolic panel     Status: Abnormal   Collection Time: 05/02/22 10:05 AM  Result Value Ref Range  Sodium 138 135 - 145 mmol/L   Potassium 3.4 (L) 3.5 - 5.1 mmol/L   Chloride 99 98 - 111 mmol/L   CO2 27 22 - 32 mmol/L   Glucose, Bld 184 (H) 70 - 99 mg/dL    Comment: Glucose reference range applies only to samples taken after fasting for at least 8 hours.   BUN 40 (H) 8 - 23 mg/dL   Creatinine, Ser 1.08 (H) 0.44 - 1.00 mg/dL   Calcium 10.3 8.9 - 10.3 mg/dL   GFR, Estimated 53 (L) >60 mL/min    Comment: (NOTE) Calculated using the CKD-EPI Creatinine Equation (2021)    Anion gap 12 5 - 15    Comment: Performed at [facility], [address] [place], [place] [address]  UA/M w/rflx Culture, Routine     Status: Abnormal   Collection Time: 05/19/22 10:00 AM   Specimen: Urine   Urine  Result Value Ref Range   Specific Gravity, UA 1.018 1.005 - 1.030   pH, UA 6.5 5.0 - 7.5   Color, UA Yellow Yellow   Appearance Ur Clear Clear   Leukocytes,UA Negative Negative   Protein,UA Negative Negative/Trace   Glucose, UA 2+ (A) Negative   Ketones, UA Negative Negative   RBC, UA Negative Negative   Bilirubin, UA Negative Negative   Urobilinogen, Ur 0.2 0.2 - 1.0 mg/dL   Nitrite, UA Negative Negative   Microscopic Examination Comment     Comment: Microscopic follows if indicated.   Microscopic Examination See below:     Comment: Microscopic was indicated and was performed.   Urinalysis Reflex Comment     Comment: This specimen will not reflex to a Urine Culture.  Microscopic Examination     Status: None   Collection Time:  05/19/22 10:00 AM   Urine  Result Value Ref Range   WBC, UA 0-5 0 - 5 /hpf   RBC, Urine 0-2 0 - 2 /hpf   Epithelial Cells (non renal) 0-10 0 - 10 /hpf   Casts None seen None seen /lpf   Bacteria, UA None seen None seen/Few  CBC with Differential/Platelet     Status: None   Collection Time: 05/19/22 11:08 AM  Result Value Ref Range   WBC 8.8 4.0 - 10.5 K/uL   RBC 4.88 3.87 - 5.11 MIL/uL   Hemoglobin 14.4 12.0 - 15.0 g/dL   HCT 42.9 36.0 - 46.0 %   MCV 87.9 80.0 - 100.0 fL   MCH 29.5 26.0 - 34.0 pg   MCHC 33.6 30.0 - 36.0 g/dL   RDW 12.0 11.5 - 15.5 %   Platelets 237 150 - 400 K/uL   nRBC 0.0 0.0 - 0.2 %   Neutrophils Relative % 50 %   Neutro Abs 4.4 1.7 - 7.7 K/uL   Lymphocytes Relative 40 %   Lymphs Abs 3.6 0.7 - 4.0 K/uL   Monocytes Relative 8 %   Monocytes Absolute 0.7 0.1 - 1.0 K/uL   Eosinophils Relative 1 %   Eosinophils Absolute 0.1 0.0 - 0.5 K/uL   Basophils Relative 1 %   Basophils Absolute 0.1 0.0 - 0.1 K/uL   Immature Granulocytes 0 %   Abs Immature Granulocytes 0.02 0.00 - 0.07 K/uL    Comment: Performed at [facility], [address] [place], [place] [address]  Lipid panel     Status: None   Collection Time: 05/19/22 11:08 AM  Result Value Ref Range   Cholesterol 162 0 - 200 mg/dL  Triglycerides 111 <150 mg/dL   HDL 48 >40 mg/dL   Total CHOL/HDL Ratio 3.4 RATIO   VLDL 22 0 - 40 mg/dL   LDL Cholesterol 92 0 - 99 mg/dL    Comment:        Total Cholesterol/HDL:CHD Risk Coronary Heart Disease Risk Table                     Men   Women  1/2 Average Risk   3.4   3.3  Average Risk       5.0   4.4  2 X Average Risk   9.6   7.1  3 X Average Risk  23.4   11.0        Use the calculated Patient Ratio above and the CHD Risk Table to determine the patient's CHD Risk.        ATP III CLASSIFICATION (LDL):  <100     mg/dL   Optimal  100-129  mg/dL   Near or Above                    Optimal  130-159  mg/dL   Borderline  160-189  mg/dL   High  >190      mg/dL   Very High Performed at [facility], [address] [place], [place] [address]   TSH     Status: None   Collection Time: 05/19/22 11:08 AM  Result Value Ref Range   TSH 1.527 0.350 - 4.500 uIU/mL    Comment: Performed by a 3rd Generation assay with a functional sensitivity of <=0.01 uIU/mL. Performed at [facility], [address] [place], [place] [address]   T4, free     Status: None   Collection Time: 05/19/22 11:08 AM  Result Value Ref Range   Free T4 0.81 0.61 - 1.12 ng/dL    Comment: (NOTE) Biotin ingestion may interfere with free T4 tests. If the results are inconsistent with the TSH level, previous test results, or the clinical presentation, then consider biotin interference. If needed, order repeat testing after stopping biotin. Performed at [facility], [address] [place], [place] [address]   Comprehensive metabolic panel     Status: Abnormal   Collection Time: 05/19/22 11:08 AM  Result Value Ref Range   Sodium 139 135 - 145 mmol/L   Potassium 3.3 (L) 3.5 - 5.1 mmol/L   Chloride 99 98 - 111 mmol/L   CO2 27 22 - 32 mmol/L   Glucose, Bld 137 (H) 70 - 99 mg/dL    Comment: Glucose reference range applies only to samples taken after fasting for at least 8 hours.   BUN 27 (H) 8 - 23 mg/dL   Creatinine, Ser 0.77 0.44 - 1.00 mg/dL   Calcium 9.7 8.9 - 10.3 mg/dL   Total Protein 7.6 6.5 - 8.1 g/dL   Albumin 4.1 3.5 - 5.0 g/dL   AST 23 15 - 41 U/L   ALT 20 0 - 44 U/L   Alkaline Phosphatase 58 38 - 126 U/L   Total Bilirubin 0.9 0.3 - 1.2 mg/dL   GFR, Estimated >60 >60 mL/min    Comment: (NOTE) Calculated using the CKD-EPI Creatinine Equation (2021)    Anion gap 13 5 - 15    Comment: Performed at [facility], [address] [place], [place] [address]  Glucose, capillary     Status: Abnormal   Collection Time: 05/24/22  9:48 AM  Result  Value Ref Range   Glucose-Capillary 161 (H) 70 - 99 mg/dL    Comment:  Glucose reference range applies only to samples taken after fasting for at least 8 hours.        Assessment/Plan: 1. Encounter for general adult medical examination with abnormal findings CPE performed, labs already ordered, UTD on PHM  2. Type 2 diabetes mellitus with hyperglycemia, without long-term current use of insulin (HCC) Continue current medications and working on diet  3. Chronic respiratory failure with hypoxia (HCC) Will continue oxygen as before  4. Oxygen dependent Will continue oxygen as before  5. Bilateral lower extremity edema Continue compression stockings and continue diuretics as prescribed  6. Dysuria - UA/M w/rflx Culture, Routine - Microscopic Examination   General Counseling: Marilou verbalizes understanding of the findings of todays visit and agrees with plan of treatment. I have discussed any further diagnostic evaluation that may be needed or ordered today. We also reviewed her medications today. she has been encouraged to call the office with any questions or concerns that should arise related to todays visit.    Counseling:    Orders Placed This Encounter  Procedures   Microscopic Examination   UA/M w/rflx Culture, Routine    No orders of the defined types were placed in this encounter.   This patient was seen by Drema Dallas, PA-C in collaboration with Dr. Clayborn Bigness as a part of collaborative care agreement.  Total time spent:35 Minutes  Time spent includes review of chart, medications, test results, and follow up plan with the patient.     Lavera Guise, MD  Internal Medicine

## 2022-05-20 LAB — UA/M W/RFLX CULTURE, ROUTINE
Bilirubin, UA: NEGATIVE
Ketones, UA: NEGATIVE
Leukocytes,UA: NEGATIVE
Nitrite, UA: NEGATIVE
Protein,UA: NEGATIVE
RBC, UA: NEGATIVE
Specific Gravity, UA: 1.018 (ref 1.005–1.030)
Urobilinogen, Ur: 0.2 mg/dL (ref 0.2–1.0)
pH, UA: 6.5 (ref 5.0–7.5)

## 2022-05-20 LAB — MICROSCOPIC EXAMINATION
Bacteria, UA: NONE SEEN
Casts: NONE SEEN /lpf

## 2022-05-22 ENCOUNTER — Ambulatory Visit: Payer: Medicare Other | Admitting: Internal Medicine

## 2022-05-22 NOTE — Progress Notes (Signed)
pt is instructed to take one extra potassium today and one tomorrow.

## 2022-05-23 VITALS — Ht 64.6 in | Wt 186.4 lb

## 2022-05-23 DIAGNOSIS — Z23 Encounter for immunization: Secondary | ICD-10-CM | POA: Diagnosis not present

## 2022-05-23 DIAGNOSIS — N200 Calculus of kidney: Secondary | ICD-10-CM | POA: Diagnosis not present

## 2022-05-23 DIAGNOSIS — J449 Chronic obstructive pulmonary disease, unspecified: Secondary | ICD-10-CM

## 2022-05-23 DIAGNOSIS — J9611 Chronic respiratory failure with hypoxia: Secondary | ICD-10-CM | POA: Diagnosis not present

## 2022-05-23 DIAGNOSIS — Z87442 Personal history of urinary calculi: Secondary | ICD-10-CM | POA: Diagnosis not present

## 2022-05-23 NOTE — Patient Instructions (Signed)
Patient Instructions  Patient Details  Name: Amber Holder MRN: [id number] Date of Birth: 12/13/1945 Referring Provider:  Lavera Guise, MD  Below are your personal goals for exercise, nutrition, and risk factors. Our goal is to help you stay on track towards obtaining and maintaining these goals. We will be discussing your progress on these goals with you throughout the program.  Initial Exercise Prescription:  Initial Exercise Prescription - 05/23/22 1400       Date of Initial Exercise RX and Referring Provider   Date 05/23/22    Referring Provider Clayborn Bigness MD      Oxygen   Oxygen Continuous    Liters 3    Maintain Oxygen Saturation 88% or higher      Treadmill   MPH 1.4    Grade 0    Minutes 15    METs 2.07      NuStep   Level 1    SPM 80    Minutes 15    METs 1.6      REL-XR   Level 1    Speed 50    Minutes 15    METs 1.6      Prescription Details   Frequency (times per week) 3    Duration Progress to 30 minutes of continuous aerobic without signs/symptoms of physical distress      Intensity   THRR 40-80% of Max Heartrate 100 - 129    Ratings of Perceived Exertion 11-13    Perceived Dyspnea 0-4      Progression   Progression Continue to progress workloads to maintain intensity without signs/symptoms of physical distress.      Resistance Training   Training Prescription Yes    Weight 3 lb    Reps 10-15             Exercise Goals: Frequency: Be able to perform aerobic exercise two to three times per week in program working toward 2-5 days per week of home exercise.  Intensity: Work with a perceived exertion of 11 (fairly light) - 15 (hard) while following your exercise prescription.  We will make changes to your prescription with you as you progress through the program.   Duration: Be able to do 30 to 45 minutes of continuous aerobic exercise in addition to a 5 minute warm-up and a 5 minute cool-down routine.   Nutrition Goals: Your personal  nutrition goals will be established when you do your nutrition analysis with the dietician.  The following are general nutrition guidelines to follow: Cholesterol < '200mg'$ /day Sodium < '1500mg'$ /day Fiber: Women over 50 yrs - 21 grams per day  Personal Goals:  Personal Goals and Risk Factors at Admission - 05/23/22 1459       Core Components/Risk Factors/Patient Goals on Admission    Weight Management Yes;Weight Loss;Obesity    Intervention Weight Management: Develop a combined nutrition and exercise program designed to reach desired caloric intake, while maintaining appropriate intake of nutrient and fiber, sodium and fats, and appropriate energy expenditure required for the weight goal.;Weight Management: Provide education and appropriate resources to help participant work on and attain dietary goals.;Weight Management/Obesity: Establish reasonable short term and long term weight goals.;Obesity: Provide education and appropriate resources to help participant work on and attain dietary goals.    Admit Weight 186 lb 6.4 oz (84.6 kg)    Goal Weight: Short Term 181 lb (82.1 kg)    Goal Weight: Long Term 175 lb (79.4 kg)    Expected Outcomes Short  Term: Continue to assess and modify interventions until short term weight is achieved;Long Term: Adherence to nutrition and physical activity/exercise program aimed toward attainment of established weight goal;Weight Loss: Understanding of general recommendations for a balanced deficit meal plan, which promotes 1-2 lb weight loss per week and includes a negative energy balance of [id number] kcal/d;Understanding recommendations for meals to include 15-35% energy as protein, 25-35% energy from fat, 35-60% energy from carbohydrates, less than '200mg'$  of dietary cholesterol, 20-35 gm of total fiber daily;Understanding of distribution of calorie intake throughout the day with the consumption of 4-5 meals/snacks    Tobacco Cessation Yes   Quit May 2023   Number of packs  per day 0    Intervention Assist the participant in steps to quit. Provide individualized education and counseling about committing to Tobacco Cessation, relapse prevention, and pharmacological support that can be provided by physician.;Advice worker, assist with locating and accessing local/national Quit Smoking programs, and support quit date choice.    Expected Outcomes Short Term: Will demonstrate readiness to quit, by selecting a quit date.;Short Term: Will quit all tobacco product use, adhering to prevention of relapse plan.;Long Term: Complete abstinence from all tobacco products for at least 12 months from quit date.    Improve shortness of breath with ADL's Yes    Intervention Provide education, individualized exercise plan and daily activity instruction to help decrease symptoms of SOB with activities of daily living.    Expected Outcomes Short Term: Improve cardiorespiratory fitness to achieve a reduction of symptoms when performing ADLs;Long Term: Be able to perform more ADLs without symptoms or delay the onset of symptoms    Diabetes Yes    Intervention Provide education about signs/symptoms and action to take for hypo/hyperglycemia.;Provide education about proper nutrition, including hydration, and aerobic/resistive exercise prescription along with prescribed medications to achieve blood glucose in normal ranges: Fasting glucose 65-99 mg/dL    Expected Outcomes Short Term: Participant verbalizes understanding of the signs/symptoms and immediate care of hyper/hypoglycemia, proper foot care and importance of medication, aerobic/resistive exercise and nutrition plan for blood glucose control.;Long Term: Attainment of HbA1C < 7%.    Heart Failure Yes    Intervention Provide a combined exercise and nutrition program that is supplemented with education, support and counseling about heart failure. Directed toward relieving symptoms such as shortness of breath, decreased exercise  tolerance, and extremity edema.    Expected Outcomes Improve functional capacity of life;Short term: Attendance in program 2-3 days a week with increased exercise capacity. Reported lower sodium intake. Reported increased fruit and vegetable intake. Reports medication compliance.;Short term: Daily weights obtained and reported for increase. Utilizing diuretic protocols set by physician.;Long term: Adoption of self-care skills and reduction of barriers for early signs and symptoms recognition and intervention leading to self-care maintenance.             Tobacco Use Initial Evaluation: Social History   Tobacco Use  Smoking Status Former   Packs/day: 1.00   Years: 50.00   Total pack years: 50.00   Types: Cigarettes   Quit date: 12/28/2021   Years since quitting: 0.4   Passive exposure: Never  Smokeless Tobacco Never  Tobacco Comments   1 pack daily//quit 12/28/2021    Exercise Goals and Review:  Exercise Goals     Row Name 05/23/22 1456             Exercise Goals   Increase Physical Activity Yes       Intervention Provide advice, education, support  and counseling about physical activity/exercise needs.;Develop an individualized exercise prescription for aerobic and resistive training based on initial evaluation findings, risk stratification, comorbidities and participant's personal goals.       Expected Outcomes Short Term: Attend rehab on a regular basis to increase amount of physical activity.;Long Term: Add in home exercise to make exercise part of routine and to increase amount of physical activity.;Long Term: Exercising regularly at least 3-5 days a week.       Increase Strength and Stamina Yes       Intervention Provide advice, education, support and counseling about physical activity/exercise needs.;Develop an individualized exercise prescription for aerobic and resistive training based on initial evaluation findings, risk stratification, comorbidities and participant's  personal goals.       Expected Outcomes Short Term: Increase workloads from initial exercise prescription for resistance, speed, and METs.;Short Term: Perform resistance training exercises routinely during rehab and add in resistance training at home;Long Term: Improve cardiorespiratory fitness, muscular endurance and strength as measured by increased METs and functional capacity (6MWT)       Able to understand and use rate of perceived exertion (RPE) scale Yes       Intervention Provide education and explanation on how to use RPE scale       Expected Outcomes Short Term: Able to use RPE daily in rehab to express subjective intensity level;Long Term:  Able to use RPE to guide intensity level when exercising independently       Able to understand and use Dyspnea scale Yes       Intervention Provide education and explanation on how to use Dyspnea scale       Expected Outcomes Short Term: Able to use Dyspnea scale daily in rehab to express subjective sense of shortness of breath during exertion;Long Term: Able to use Dyspnea scale to guide intensity level when exercising independently       Knowledge and understanding of Target Heart Rate Range (THRR) Yes       Intervention Provide education and explanation of THRR including how the numbers were predicted and where they are located for reference       Expected Outcomes Short Term: Able to state/look up THRR;Long Term: Able to use THRR to govern intensity when exercising independently;Short Term: Able to use daily as guideline for intensity in rehab       Able to check pulse independently Yes       Intervention Provide education and demonstration on how to check pulse in carotid and radial arteries.;Review the importance of being able to check your own pulse for safety during independent exercise       Expected Outcomes Short Term: Able to explain why pulse checking is important during independent exercise;Long Term: Able to check pulse independently and  accurately       Understanding of Exercise Prescription Yes       Intervention Provide education, explanation, and written materials on patient's individual exercise prescription       Expected Outcomes Short Term: Able to explain program exercise prescription;Long Term: Able to explain home exercise prescription to exercise independently                Copy of goals given to participant.

## 2022-05-23 NOTE — Progress Notes (Signed)
Pulmonary Individual Treatment Plan  Patient Details  Name: Amber Holder MRN: [id number] Date of Birth: 03-May-1946 Referring Provider:   Flowsheet Row Pulmonary Rehab from 05/23/2022 in [facility] Cardiac and Pulmonary Rehab  Referring Provider Clayborn Bigness MD       Initial Encounter Date:  Flowsheet Row Pulmonary Rehab from 05/23/2022 in [facility] Cardiac and Pulmonary Rehab  Date 05/23/22       Visit Diagnosis: Chronic obstructive pulmonary disease, unspecified COPD type ([place])  Patient's Home Medications on Admission:  Current Outpatient Medications:    acetaminophen (TYLENOL) 500 MG chewable tablet, Chew 1,000 mg by mouth every 8 (eight) hours as needed for pain., Disp: , Rfl:    albuterol (VENTOLIN HFA) 108 (90 Base) MCG/ACT inhaler, Inhale 2 puffs into the lungs every 6 (six) hours as needed for wheezing or shortness of breath., Disp: 18 g, Rfl: 2   ALPRAZolam (XANAX) 0.25 MG tablet, Take one tab po bid for anxiety prn, Disp: 30 tablet, Rfl: 1   aspirin EC 81 MG tablet, Take 1 tablet (81 mg total) by mouth daily. Swallow whole., Disp: 90 tablet, Rfl: 3   atorvastatin (LIPITOR) 20 MG tablet, Take 1 tablet (20 mg total) by mouth daily., Disp: 90 tablet, Rfl: 3   atorvastatin (LIPITOR) 40 MG tablet, Take 40 mg by mouth daily., Disp: , Rfl:    buPROPion (WELLBUTRIN XL) 150 MG 24 hr tablet, TAKE 1 TABLET BY MOUTH EVERY DAY, Disp: 90 tablet, Rfl: 2   cholecalciferol (VITAMIN D) 400 UNITS TABS tablet, Take 2,000 Units by mouth daily. , Disp: , Rfl:    CRANBERRY PO, Take 25,000 mg by mouth daily., Disp: , Rfl:    dapagliflozin propanediol (FARXIGA) 10 MG TABS tablet, Take 1 tablet (10 mg total) by mouth daily before breakfast., Disp: 90 tablet, Rfl: 3   diphenhydrAMINE (BENADRYL) 50 MG tablet, Take one tab 13 hrs, 7 hrs and 1 hr before procedure along with prednisone, may take one extra if experienced itching or hives, Disp: 4 tablet, Rfl: 0   ergocalciferol (DRISDOL) 1.25 MG (50000 UT) capsule,  Take one cap q week, Disp: 12 capsule, Rfl: 3   fluconazole (DIFLUCAN) 150 MG tablet, Take one tab po q week for fungal infection, Disp: 4 tablet, Rfl: 0   fluticasone (FLOVENT HFA) 110 MCG/ACT inhaler, Inhale 2 puffs into the lungs 2 (two) times daily as needed., Disp: , Rfl:    furosemide (LASIX) 20 MG tablet, Take by mouth., Disp: , Rfl:    glucose blood (ONETOUCH VERIO) test strip, BLOOD SUGAR TESTING THREE TIMES DAILY . DX E11.65, Disp: 100 strip, Rfl: 3   hydrocortisone cream 0.5 %, Apply topically as needed. , Disp: , Rfl:    hydrocortisone cream 0.5 %, Apply topically., Disp: , Rfl:    ibandronate (BONIVA) 150 MG tablet, Take 1 tablet (150 mg total) by mouth every 30 (thirty) days., Disp: 3 tablet, Rfl: 3   Lancets (ONETOUCH DELICA PLUS [id number]) MISC, Use  as directed twice a daily DX E11.65, Disp: 100 each, Rfl: 3   Magnesium 250 MG TABS, Take by mouth. Takes 1 tablet 2-3 times per week, Disp: , Rfl:    metFORMIN (GLUCOPHAGE) 500 MG tablet, Take 500 mg by mouth daily. with dinner, Disp: , Rfl:    metolazone (ZAROXOLYN) 5 MG tablet, Take 0.5 tablets (2.5 mg total) by mouth daily. Take 30 mins prior to your Torsemide., Disp: 30 tablet, Rfl: 1   montelukast (SINGULAIR) 10 MG tablet, Take 10 mg by mouth  daily as needed., Disp: , Rfl:    Multiple Vitamins-Minerals (MULTIVITAMIN ADULTS PO), Take 1 tablet by mouth daily., Disp: , Rfl:    nitrofurantoin, macrocrystal-monohydrate, (MACROBID) 100 MG capsule, Take 1 capsule (100 mg total) by mouth 2 (two) times daily. Use as instructed, Disp: 20 capsule, Rfl: 0   omeprazole (PRILOSEC) 40 MG capsule, Take 1 capsule (40 mg total) by mouth daily., Disp: , Rfl:    OXYGEN, Inhale 3 L into the lungs. Pt uses [facility] for Oxygen, Disp: , Rfl:    potassium chloride SA (KLOR-CON M) 20 MEQ tablet, TAKE 2 TABS IN AM AND ONE TAB AT [facility] AND THEN MAY TAKE ONE EXTRA PER MD DIRECTION, Disp: 300 tablet, Rfl: 2   predniSONE & diphenhydrAMINE (CONTRAST  ALLERGY PREMED PACK) 3 x 50 MG & 1 x 50 MG KIT, Take 50 mg by mouth Once PRN for up to 1 dose (TAKE 13 hour prior to CT angio)., Disp: 1 kit, Rfl: 0   predniSONE (DELTASONE) 50 MG tablet, Take one tab 13 hrs, 7 hrs and 1 hr before scheduled time along with Benadryl, may take one extra after the procedure if experienced itching and hives, Disp: 4 tablet, Rfl: 0   Simethicone 80 MG TABS, Take 1 tablet (80 mg total) by mouth as needed (burping). As directed., Disp: , Rfl: 0   Tiotropium Bromide-Olodaterol 2.5-2.5 MCG/ACT AERS, Inhale 2 puffs into the lungs daily., Disp: 1 each, Rfl: 4   torsemide (DEMADEX) 20 MG tablet, Take 1 tablet (20 mg total) by mouth daily., Disp: 30 tablet, Rfl: 1  Past Medical History: Past Medical History:  Diagnosis Date   Anxiety 01/02/2012   Arthritis    Asthma    Blockage of coronary artery of heart (HCC)    Chronic kidney disease    Kidney stones   Colon polyps    COPD (chronic obstructive pulmonary disease) (HCC)    Diabetes mellitus without complication (HCC)    GERD (gastroesophageal reflux disease)    HOH (hard of hearing)    Bilateral hearing aids   Kidney stone    Osteoporosis    Sleep apnea    Does not use C-PAP on a regular basis   Venous stasis     Tobacco Use: Social History   Tobacco Use  Smoking Status Former   Packs/day: 1.00   Years: 50.00   Total pack years: 50.00   Types: Cigarettes   Quit date: 12/28/2021   Years since quitting: 0.4   Passive exposure: Never  Smokeless Tobacco Never  Tobacco Comments   1 pack daily//quit 12/28/2021    Labs: Review Flowsheet  More data exists      Latest Ref Rng & Units 10/03/2021 01/10/2022 01/16/2022 04/24/2022 05/19/2022  Labs for ITP Cardiac and Pulmonary Rehab  Cholestrol 0 - 200 mg/dL - - 261  - 162   LDL (calc) 0 - 99 mg/dL - - 182  - 92   HDL-C >40 mg/dL - - 52  - 48   Trlycerides <150 mg/dL - - 134  - 111   Hemoglobin A1c 4.0 - 5.6 % 5.7  6.5  - 6.2  -     Pulmonary Assessment  Scores:  Pulmonary Assessment Scores     Row Name 05/23/22 1415         ADL UCSD   ADL Phase Entry     SOB Score total 62     Rest 2     Walk 3  Stairs 5     Bath 2     Dress 2     Shop 3       CAT Score   CAT Score 17       mMRC Score   mMRC Score 2              UCSD: Self-administered rating of dyspnea associated with activities of daily living (ADLs) 6-point scale (0 = "not at all" to 5 = "maximal or unable to do because of breathlessness")  Scoring Scores range from 0 to 120.  Minimally important difference is 5 units  CAT: CAT can identify the health impairment of COPD patients and is better correlated with disease progression.  CAT has a scoring range of zero to 40. The CAT score is classified into four groups of low (less than 10), medium (10 - 20), high (21-30) and very high (31-40) based on the impact level of disease on health status. A CAT score over 10 suggests significant symptoms.  A worsening CAT score could be explained by an exacerbation, poor medication adherence, poor inhaler technique, or progression of COPD or comorbid conditions.  CAT MCID is 2 points  mMRC: mMRC (Modified Medical Research Council) Dyspnea Scale is used to assess the degree of baseline functional disability in patients of respiratory disease due to dyspnea. No minimal important difference is established. A decrease in score of 1 point or greater is considered a positive change.   Pulmonary Function Assessment:  Pulmonary Function Assessment - 05/15/22 1052       Breath   Shortness of Breath Yes;Limiting activity             Exercise Target Goals: Exercise Program Goal: Individual exercise prescription set using results from initial 6 min walk test and THRR while considering  patient's activity barriers and safety.   Exercise Prescription Goal: Initial exercise prescription builds to 30-45 minutes a day of aerobic activity, 2-3 days per week.  Home exercise  guidelines will be given to patient during program as part of exercise prescription that the participant will acknowledge.  Education: Aerobic Exercise: - Group verbal and visual presentation on the components of exercise prescription. Introduces F.I.T.T principle from ACSM for exercise prescriptions.  Reviews F.I.T.T. principles of aerobic exercise including progression. Written material given at graduation.   Education: Resistance Exercise: - Group verbal and visual presentation on the components of exercise prescription. Introduces F.I.T.T principle from ACSM for exercise prescriptions  Reviews F.I.T.T. principles of resistance exercise including progression. Written material given at graduation.    Education: Exercise & Equipment Safety: - Individual verbal instruction and demonstration of equipment use and safety with use of the equipment. Flowsheet Row Pulmonary Rehab from 05/15/2022 in [facility] Cardiac and Pulmonary Rehab  Date 05/15/22  Educator [facility]  Instruction Review Code 1- Verbalizes Understanding       Education: Exercise Physiology & General Exercise Guidelines: - Group verbal and written instruction with models to review the exercise physiology of the cardiovascular system and associated critical values. Provides general exercise guidelines with specific guidelines to those with heart or lung disease.    Education: Flexibility, Balance, Mind/Body Relaxation: - Group verbal and visual presentation with interactive activity on the components of exercise prescription. Introduces F.I.T.T principle from ACSM for exercise prescriptions. Reviews F.I.T.T. principles of flexibility and balance exercise training including progression. Also discusses the mind body connection.  Reviews various relaxation techniques to help reduce and manage stress (i.e. Deep breathing, progressive muscle relaxation, and visualization).  Balance handout provided to take home. Written material given at  graduation.   Activity Barriers & Risk Stratification:  Activity Barriers & Cardiac Risk Stratification - 05/23/22 1452       Activity Barriers & Cardiac Risk Stratification   Activity Barriers Right Hip Replacement;Deconditioning;Muscular Weakness;Shortness of Breath;Decreased Ventricular Function             6 Minute Walk:  6 Minute Walk     Row Name 05/23/22 1450         6 Minute Walk   Phase Initial     Distance 990 feet     Walk Time 6 minutes     # of Rest Breaks 0     MPH 1.87     METS 1.62     RPE 15     Perceived Dyspnea  3     VO2 Peak 5.67     Symptoms Yes (comment)     Comments Right hip tightness, SOB     Resting HR 72 bpm     Resting BP 106/66     Resting Oxygen Saturation  93 %     Exercise Oxygen Saturation  during 6 min walk 87 %     Max Ex. HR 90 bpm     Max Ex. BP 124/62     2 Minute Post BP 104/66       Interval HR   1 Minute HR 83     2 Minute HR 85     3 Minute HR 87     4 Minute HR 88     5 Minute HR 89     6 Minute HR 90     2 Minute Post HR 77     Interval Heart Rate? Yes       Interval Oxygen   Interval Oxygen? Yes     Baseline Oxygen Saturation % 93 %     1 Minute Oxygen Saturation % 92 %     1 Minute Liters of Oxygen 3 L     2 Minute Oxygen Saturation % 88 %     2 Minute Liters of Oxygen 3 L     3 Minute Oxygen Saturation % 87 %     3 Minute Liters of Oxygen 3 L     4 Minute Oxygen Saturation % 88 %     4 Minute Liters of Oxygen 3 L     5 Minute Oxygen Saturation % 87 %     5 Minute Liters of Oxygen 3 L     6 Minute Oxygen Saturation % 87 %     6 Minute Liters of Oxygen 3 L     2 Minute Post Oxygen Saturation % 94 %     2 Minute Post Liters of Oxygen 3 L             Oxygen Initial Assessment:  Oxygen Initial Assessment - 05/15/22 1050       Home Oxygen   Home Oxygen Device Home Concentrator;E-Tanks;Portable Concentrator    Sleep Oxygen Prescription Continuous    Home Exercise Oxygen Prescription Continuous     Liters per minute 3    Home Resting Oxygen Prescription Continuous    Liters per minute 2    Compliance with Home Oxygen Use Yes      Initial 6 min Walk   Oxygen Used Continuous;Portable Concentrator    Liters per minute 3      Program Oxygen Prescription  Program Oxygen Prescription Continuous    Liters per minute 3      Intervention   Short Term Goals To learn and exhibit compliance with exercise, home and travel O2 prescription;To learn and understand importance of monitoring SPO2 with pulse oximeter and demonstrate accurate use of the pulse oximeter.;To learn and understand importance of maintaining oxygen saturations>88%;To learn and demonstrate proper pursed lip breathing techniques or other breathing techniques. ;To learn and demonstrate proper use of respiratory medications    Long  Term Goals Exhibits compliance with exercise, home  and travel O2 prescription;Verbalizes importance of monitoring SPO2 with pulse oximeter and return demonstration;Maintenance of O2 saturations>88%;Exhibits proper breathing techniques, such as pursed lip breathing or other method taught during program session;Compliance with respiratory medication;Demonstrates proper use of MDI's             Oxygen Re-Evaluation:   Oxygen Discharge (Final Oxygen Re-Evaluation):   Initial Exercise Prescription:  Initial Exercise Prescription - 05/23/22 1400       Date of Initial Exercise RX and Referring Provider   Date 05/23/22    Referring Provider Clayborn Bigness MD      Oxygen   Oxygen Continuous    Liters 3    Maintain Oxygen Saturation 88% or higher      Treadmill   MPH 1.4    Grade 0    Minutes 15    METs 2.07      NuStep   Level 1    SPM 80    Minutes 15    METs 1.6      REL-XR   Level 1    Speed 50    Minutes 15    METs 1.6      Prescription Details   Frequency (times per week) 3    Duration Progress to 30 minutes of continuous aerobic without signs/symptoms of physical  distress      Intensity   THRR 40-80% of Max Heartrate 100 - 129    Ratings of Perceived Exertion 11-13    Perceived Dyspnea 0-4      Progression   Progression Continue to progress workloads to maintain intensity without signs/symptoms of physical distress.      Resistance Training   Training Prescription Yes    Weight 3 lb    Reps 10-15             Perform Capillary Blood Glucose checks as needed.  Exercise Prescription Changes:   Exercise Prescription Changes     Row Name 05/23/22 1400             Response to Exercise   Blood Pressure (Admit) 106/66       Blood Pressure (Exercise) 124/62       Blood Pressure (Exit) 104/66       Heart Rate (Admit) 72 bpm       Heart Rate (Exercise) 90 bpm       Heart Rate (Exit) 65 bpm       Oxygen Saturation (Admit) 93 %       Oxygen Saturation (Exercise) 87 %       Oxygen Saturation (Exit) 94 %       Rating of Perceived Exertion (Exercise) 15       Perceived Dyspnea (Exercise) 3       Symptoms SOB, right hip tightness       Comments walk test results                Exercise Comments:  Exercise Goals and Review:   Exercise Goals     Row Name 05/23/22 1456             Exercise Goals   Increase Physical Activity Yes       Intervention Provide advice, education, support and counseling about physical activity/exercise needs.;Develop an individualized exercise prescription for aerobic and resistive training based on initial evaluation findings, risk stratification, comorbidities and participant's personal goals.       Expected Outcomes Short Term: Attend rehab on a regular basis to increase amount of physical activity.;Long Term: Add in home exercise to make exercise part of routine and to increase amount of physical activity.;Long Term: Exercising regularly at least 3-5 days a week.       Increase Strength and Stamina Yes       Intervention Provide advice, education, support and counseling about physical  activity/exercise needs.;Develop an individualized exercise prescription for aerobic and resistive training based on initial evaluation findings, risk stratification, comorbidities and participant's personal goals.       Expected Outcomes Short Term: Increase workloads from initial exercise prescription for resistance, speed, and METs.;Short Term: Perform resistance training exercises routinely during rehab and add in resistance training at home;Long Term: Improve cardiorespiratory fitness, muscular endurance and strength as measured by increased METs and functional capacity (6MWT)       Able to understand and use rate of perceived exertion (RPE) scale Yes       Intervention Provide education and explanation on how to use RPE scale       Expected Outcomes Short Term: Able to use RPE daily in rehab to express subjective intensity level;Long Term:  Able to use RPE to guide intensity level when exercising independently       Able to understand and use Dyspnea scale Yes       Intervention Provide education and explanation on how to use Dyspnea scale       Expected Outcomes Short Term: Able to use Dyspnea scale daily in rehab to express subjective sense of shortness of breath during exertion;Long Term: Able to use Dyspnea scale to guide intensity level when exercising independently       Knowledge and understanding of Target Heart Rate Range (THRR) Yes       Intervention Provide education and explanation of THRR including how the numbers were predicted and where they are located for reference       Expected Outcomes Short Term: Able to state/look up THRR;Long Term: Able to use THRR to govern intensity when exercising independently;Short Term: Able to use daily as guideline for intensity in rehab       Able to check pulse independently Yes       Intervention Provide education and demonstration on how to check pulse in carotid and radial arteries.;Review the importance of being able to check your own pulse for  safety during independent exercise       Expected Outcomes Short Term: Able to explain why pulse checking is important during independent exercise;Long Term: Able to check pulse independently and accurately       Understanding of Exercise Prescription Yes       Intervention Provide education, explanation, and written materials on patient's individual exercise prescription       Expected Outcomes Short Term: Able to explain program exercise prescription;Long Term: Able to explain home exercise prescription to exercise independently                Exercise Goals Re-Evaluation :  Discharge Exercise Prescription (Final Exercise Prescription Changes):  Exercise Prescription Changes - 05/23/22 1400       Response to Exercise   Blood Pressure (Admit) 106/66    Blood Pressure (Exercise) 124/62    Blood Pressure (Exit) 104/66    Heart Rate (Admit) 72 bpm    Heart Rate (Exercise) 90 bpm    Heart Rate (Exit) 65 bpm    Oxygen Saturation (Admit) 93 %    Oxygen Saturation (Exercise) 87 %    Oxygen Saturation (Exit) 94 %    Rating of Perceived Exertion (Exercise) 15    Perceived Dyspnea (Exercise) 3    Symptoms SOB, right hip tightness    Comments walk test results             Nutrition:  Target Goals: Understanding of nutrition guidelines, daily intake of sodium <156m, cholesterol <2039m calories 30% from fat and 7% or less from saturated fats, daily to have 5 or more servings of fruits and vegetables.  Education: All About Nutrition: -Group instruction provided by verbal, written material, interactive activities, discussions, models, and posters to present general guidelines for heart healthy nutrition including fat, fiber, MyPlate, the role of sodium in heart healthy nutrition, utilization of the nutrition label, and utilization of this knowledge for meal planning. Follow up email sent as well. Written material given at graduation.   Biometrics:  Pre Biometrics - 05/23/22 1449        Pre Biometrics   Height 5' 4.6" (1.641 m)    Weight 186 lb 6.4 oz (84.6 kg)    BMI (Calculated) 31.4    Single Leg Stand 7.3 seconds              Nutrition Therapy Plan and Nutrition Goals:  Nutrition Therapy & Goals - 05/23/22 1444       Personal Nutrition Goals   Comments Would prefer an in-person appointment due to her hearing.      Intervention Plan   Intervention Prescribe, educate and counsel regarding individualized specific dietary modifications aiming towards targeted core components such as weight, hypertension, lipid management, diabetes, heart failure and other comorbidities.    Expected Outcomes Short Term Goal: Understand basic principles of dietary content, such as calories, fat, sodium, cholesterol and nutrients.;Short Term Goal: A plan has been developed with personal nutrition goals set during dietitian appointment.;Long Term Goal: Adherence to prescribed nutrition plan.             Nutrition Assessments:  MEDIFICTS Score Key: ?70 Need to make dietary changes  40-70 Heart Healthy Diet ? 40 Therapeutic Level Cholesterol Diet  [facility]  Picture Your Plate Total Score on Admission 68      Picture Your Plate Scores: <4<41nhealthy dietary pattern with much room for improvement. 41-50 Dietary pattern unlikely to meet recommendations for good health and room for improvement. 51-60 More healthful dietary pattern, with some room for improvement.  >60 Healthy dietary pattern, although there may be some specific behaviors that could be improved.   Nutrition Goals Re-Evaluation:   Nutrition Goals Discharge (Final Nutrition Goals Re-Evaluation):   Psychosocial: Target Goals: Acknowledge presence or absence of significant depression and/or stress, maximize coping skills, provide positive support system. Participant is able to verbalize types and ability to use techniques and  skills needed for reducing stress and depression.   Education: Stress, Anxiety, and Depression - Group verbal and visual presentation to define topics covered.  Reviews how  body is impacted by stress, anxiety, and depression.  Also discusses healthy ways to reduce stress and to treat/manage anxiety and depression.  Written material given at graduation.   Education: Sleep Hygiene -Provides group verbal and written instruction about how sleep can affect your health.  Define sleep hygiene, discuss sleep cycles and impact of sleep habits. Review good sleep hygiene tips.    Initial Review & Psychosocial Screening:  Initial Psych Review & Screening - 05/15/22 1054       Initial Review   Current issues with Current Psychotropic Meds      Family Dynamics   Good Support System? Yes    Comments She took WellButrin for awhile to quit smoking and she did. She has a good family support system.      Barriers   Psychosocial barriers to participate in program The patient should benefit from training in stress management and relaxation.      Screening Interventions   Interventions Encouraged to exercise;To provide support and resources with identified psychosocial needs;Provide feedback about the scores to participant    Expected Outcomes Short Term goal: Utilizing psychosocial counselor, staff and physician to assist with identification of specific Stressors or current issues interfering with healing process. Setting desired goal for each stressor or current issue identified.;Long Term Goal: Stressors or current issues are controlled or eliminated.;Short Term goal: Identification and review with participant of any Quality of Life or Depression concerns found by scoring the questionnaire.;Long Term goal: The participant improves quality of Life and PHQ9 Scores as seen by post scores and/or verbalization of changes             Quality of Life Scores:  Scores of 19 and below usually indicate a  poorer quality of life in these areas.  A difference of  2-3 points is a clinically meaningful difference.  A difference of 2-3 points in the total score of the Quality of Life Index has been associated with significant improvement in overall quality of life, self-image, physical symptoms, and general health in studies assessing change in quality of life.  PHQ-9: Review Flowsheet  More data exists      05/23/2022 05/19/2022 05/18/2022 02/14/2022 12/06/2021  Depression screen PHQ 2/9  Decreased Interest 1 0 0 0 0  Down, Depressed, Hopeless 0 0 0 0 0  PHQ - 2 Score 1 0 0 0 0  Altered sleeping 3 - - - -  Tired, decreased energy 2 - - - -  Change in appetite 3 - - - -  Feeling bad or failure about yourself  0 - - - -  Trouble concentrating 2 - - - -  Moving slowly or fidgety/restless 0 - - - -  Suicidal thoughts 0 - - - -  PHQ-9 Score 11 - - - -  Difficult doing work/chores Not difficult at all - - - -   Interpretation of Total Score  Total Score Depression Severity:  1-4 = Minimal depression, 5-9 = Mild depression, 10-14 = Moderate depression, 15-19 = Moderately severe depression, 20-27 = Severe depression   Psychosocial Evaluation and Intervention:  Psychosocial Evaluation - 05/15/22 1056       Psychosocial Evaluation & Interventions   Interventions Encouraged to exercise with the program and follow exercise prescription;Stress management education;Relaxation education    Comments She took WellButrin for awhile to quit smoking and she did. She has a good family support system.    Expected Outcomes Short: Start [facility] to help with mood. Long: Maintain a  healthy mental state.    Continue Psychosocial Services  Follow up required by staff             Psychosocial Re-Evaluation:   Psychosocial Discharge (Final Psychosocial Re-Evaluation):   Education: Education Goals: Education classes will be provided on a weekly basis, covering required topics. Participant will state  understanding/return demonstration of topics presented.  Learning Barriers/Preferences:  Learning Barriers/Preferences - 05/15/22 1052       Learning Barriers/Preferences   Learning Barriers Hearing    Learning Preferences None             General Pulmonary Education Topics:  Infection Prevention: - Provides verbal and written material to individual with discussion of infection control including proper hand washing and proper equipment cleaning during exercise session. Flowsheet Row Pulmonary Rehab from 05/15/2022 in [facility] Cardiac and Pulmonary Rehab  Date 05/15/22  Educator [facility]  Instruction Review Code 1- Verbalizes Understanding       Falls Prevention: - Provides verbal and written material to individual with discussion of falls prevention and safety. Flowsheet Row Pulmonary Rehab from 05/15/2022 in [facility] Cardiac and Pulmonary Rehab  Date 05/15/22  Educator [facility]  Instruction Review Code 1- Verbalizes Understanding       Chronic Lung Disease Review: - Group verbal instruction with posters, models, PowerPoint presentations and videos,  to review new updates, new respiratory medications, new advancements in procedures and treatments. Providing information on websites and "800" numbers for continued self-education. Includes information about supplement oxygen, available portable oxygen systems, continuous and intermittent flow rates, oxygen safety, concentrators, and Medicare reimbursement for oxygen. Explanation of Pulmonary Drugs, including class, frequency, complications, importance of spacers, rinsing mouth after steroid MDI's, and proper cleaning methods for nebulizers. Review of basic lung anatomy and physiology related to function, structure, and complications of lung disease. Review of risk factors. Discussion about methods for diagnosing sleep apnea and types of masks and machines for OSA. Includes a review of the use of types of environmental controls: home humidity, furnaces,  filters, dust mite/pet prevention, HEPA vacuums. Discussion about weather changes, air quality and the benefits of nasal washing. Instruction on Warning signs, infection symptoms, calling MD promptly, preventive modes, and value of vaccinations. Review of effective airway clearance, coughing and/or vibration techniques. Emphasizing that all should Create an Action Plan. Written material given at graduation. Flowsheet Row Pulmonary Rehab from 05/23/2022 in [facility] Cardiac and Pulmonary Rehab  Education need identified 05/23/22       AED/CPR: - Group verbal and written instruction with the use of models to demonstrate the basic use of the AED with the basic ABC's of resuscitation.    Anatomy and Cardiac Procedures: - Group verbal and visual presentation and models provide information about basic cardiac anatomy and function. Reviews the testing methods done to diagnose heart disease and the outcomes of the test results. Describes the treatment choices: Medical Management, Angioplasty, or Coronary Bypass Surgery for treating various heart conditions including Myocardial Infarction, Angina, Valve Disease, and Cardiac Arrhythmias.  Written material given at graduation.   Medication Safety: - Group verbal and visual instruction to review commonly prescribed medications for heart and lung disease. Reviews the medication, class of the drug, and side effects. Includes the steps to properly store meds and maintain the prescription regimen.  Written material given at graduation.   Other: -Provides group and verbal instruction on various topics (see comments)   Knowledge Questionnaire Score:    Core Components/Risk Factors/Patient Goals at Admission:  Personal Goals and Risk Factors  at Admission - 05/23/22 1459       Core Components/Risk Factors/Patient Goals on Admission    Weight Management Yes;Weight Loss;Obesity    Intervention Weight Management: Develop a combined nutrition and exercise program  designed to reach desired caloric intake, while maintaining appropriate intake of nutrient and fiber, sodium and fats, and appropriate energy expenditure required for the weight goal.;Weight Management: Provide education and appropriate resources to help participant work on and attain dietary goals.;Weight Management/Obesity: Establish reasonable short term and long term weight goals.;Obesity: Provide education and appropriate resources to help participant work on and attain dietary goals.    Admit Weight 186 lb 6.4 oz (84.6 kg)    Goal Weight: Short Term 181 lb (82.1 kg)    Goal Weight: Long Term 175 lb (79.4 kg)    Expected Outcomes Short Term: Continue to assess and modify interventions until short term weight is achieved;Long Term: Adherence to nutrition and physical activity/exercise program aimed toward attainment of established weight goal;Weight Loss: Understanding of general recommendations for a balanced deficit meal plan, which promotes 1-2 lb weight loss per week and includes a negative energy balance of [phone] kcal/d;Understanding recommendations for meals to include 15-35% energy as protein, 25-35% energy from fat, 35-60% energy from carbohydrates, less than 268m of dietary cholesterol, 20-35 gm of total fiber daily;Understanding of distribution of calorie intake throughout the day with the consumption of 4-5 meals/snacks    Tobacco Cessation Yes   Quit May 2023   Number of packs per day 0    Intervention Assist the participant in steps to quit. Provide individualized education and counseling about committing to Tobacco Cessation, relapse prevention, and pharmacological support that can be provided by physician.;OAdvice worker assist with locating and accessing local/national Quit Smoking programs, and support quit date choice.    Expected Outcomes Short Term: Will demonstrate readiness to quit, by selecting a quit date.;Short Term: Will quit all tobacco product use, adhering  to prevention of relapse plan.;Long Term: Complete abstinence from all tobacco products for at least 12 months from quit date.    Improve shortness of breath with ADL's Yes    Intervention Provide education, individualized exercise plan and daily activity instruction to help decrease symptoms of SOB with activities of daily living.    Expected Outcomes Short Term: Improve cardiorespiratory fitness to achieve a reduction of symptoms when performing ADLs;Long Term: Be able to perform more ADLs without symptoms or delay the onset of symptoms    Diabetes Yes    Intervention Provide education about signs/symptoms and action to take for hypo/hyperglycemia.;Provide education about proper nutrition, including hydration, and aerobic/resistive exercise prescription along with prescribed medications to achieve blood glucose in normal ranges: Fasting glucose 65-99 mg/dL    Expected Outcomes Short Term: Participant verbalizes understanding of the signs/symptoms and immediate care of hyper/hypoglycemia, proper foot care and importance of medication, aerobic/resistive exercise and nutrition plan for blood glucose control.;Long Term: Attainment of HbA1C < 7%.    Heart Failure Yes    Intervention Provide a combined exercise and nutrition program that is supplemented with education, support and counseling about heart failure. Directed toward relieving symptoms such as shortness of breath, decreased exercise tolerance, and extremity edema.    Expected Outcomes Improve functional capacity of life;Short term: Attendance in program 2-3 days a week with increased exercise capacity. Reported lower sodium intake. Reported increased fruit and vegetable intake. Reports medication compliance.;Short term: Daily weights obtained and reported for increase. Utilizing diuretic protocols set by physician.;Long term: Adoption  of self-care skills and reduction of barriers for early signs and symptoms recognition and intervention leading to  self-care maintenance.             Education:Diabetes - Individual verbal and written instruction to review signs/symptoms of diabetes, desired ranges of glucose level fasting, after meals and with exercise. Acknowledge that pre and post exercise glucose checks will be done for 3 sessions at entry of program. Flowsheet Row Pulmonary Rehab from 05/15/2022 in [facility] Cardiac and Pulmonary Rehab  Date 05/15/22  Educator [facility]  Instruction Review Code 1- Verbalizes Understanding       Know Your Numbers and Heart Failure: - Group verbal and visual instruction to discuss disease risk factors for cardiac and pulmonary disease and treatment options.  Reviews associated critical values for Overweight/Obesity, Hypertension, Cholesterol, and Diabetes.  Discusses basics of heart failure: signs/symptoms and treatments.  Introduces Heart Failure Zone chart for action plan for heart failure.  Written material given at graduation.   Core Components/Risk Factors/Patient Goals Review:    Core Components/Risk Factors/Patient Goals at Discharge (Final Review):    ITP Comments:  ITP Comments     Row Name 05/15/22 1050 05/23/22 1413         ITP Comments Virtual Visit completed. Patient informed on EP and RD appointment and 6 Minute walk test. Patient also informed of patient health questionnaires on My Chart. Patient Verbalizes understanding. Visit diagnosis can be found in [facility] 03/13/22. Completed 6MWT and gym orientation. Initial ITP created and sent for review to Dr. Ottie Glazier, Medical Director.               Comments: Initial ITP

## 2022-05-24 ENCOUNTER — Ambulatory Visit
Admission: RE | Admit: 2022-05-24 | Discharge: 2022-05-24 | Disposition: A | Discharge status: Home / Self Care | Payer: Medicare Other | Source: Ambulatory Visit | Attending: Urology | Admitting: Urology

## 2022-05-24 ENCOUNTER — Ambulatory Visit
Admission: RE | Admit: 2022-05-24 | Discharge: 2022-05-24 | Disposition: A | Discharge status: Home / Self Care | Payer: Medicare Other | Source: Home / Self Care | Attending: Urology | Admitting: Urology

## 2022-05-24 ENCOUNTER — Encounter: Payer: Medicare Other | Admitting: *Deleted

## 2022-05-24 DIAGNOSIS — J449 Chronic obstructive pulmonary disease, unspecified: Secondary | ICD-10-CM | POA: Diagnosis not present

## 2022-05-24 DIAGNOSIS — Z9049 Acquired absence of other specified parts of digestive tract: Secondary | ICD-10-CM | POA: Diagnosis not present

## 2022-05-24 DIAGNOSIS — Z87442 Personal history of urinary calculi: Secondary | ICD-10-CM | POA: Diagnosis not present

## 2022-05-24 DIAGNOSIS — J9611 Chronic respiratory failure with hypoxia: Secondary | ICD-10-CM | POA: Diagnosis not present

## 2022-05-24 DIAGNOSIS — N200 Calculus of kidney: Secondary | ICD-10-CM | POA: Insufficient documentation

## 2022-05-24 LAB — GLUCOSE, CAPILLARY: Glucose-Capillary: 161 mg/dL — ABNORMAL HIGH (ref 70–99)

## 2022-05-24 NOTE — Progress Notes (Signed)
Daily Session Note  Patient Details  Name: Amber Holder MRN: [id number] Date of Birth: Aug 21, 1946 Referring Provider:   Flowsheet Row Pulmonary Rehab from 05/23/2022 in [facility] Cardiac and Pulmonary Rehab  Referring Provider Clayborn Bigness MD       Encounter Date: 05/24/2022  Check In:  Session Check In - 05/24/22 1008       Check-In   Supervising physician immediately available to respond to emergencies See telemetry face sheet for immediately available ER MD    Location [facility]-Cardiac & Pulmonary Rehab    Staff Present Antionette Fairy, BS, Exercise Physiologist;Joseph Wingate, RCP,RRT,BSRT;Other   Darlyne Russian, RN   Virtual Visit No    Medication changes reported     No    Fall or balance concerns reported    No    Tobacco Cessation No Change    Warm-up and Cool-down Performed on first and last piece of equipment    Resistance Training Performed Yes    VAD Patient? No    PAD/SET Patient? No      Pain Assessment   Currently in Pain? No/denies                Social History   Tobacco Use  Smoking Status Former   Packs/day: 1.00   Years: 50.00   Total pack years: 50.00   Types: Cigarettes   Quit date: 12/28/2021   Years since quitting: 0.4   Passive exposure: Never  Smokeless Tobacco Never  Tobacco Comments   1 pack daily//quit 12/28/2021    Goals Met:  Independence with exercise equipment Exercise tolerated well No report of concerns or symptoms today Strength training completed today  Goals Unmet:  Not Applicable  Comments: First full day of exercise!  Patient was oriented to gym and equipment including functions, settings, policies, and procedures.  Patient's individual exercise prescription and treatment plan were reviewed.  All starting workloads were established based on the results of the 6 minute walk test done at initial orientation visit.  The plan for exercise progression was also introduced and progression will be customized based on patient's performance  and goals.    Dr. Emily Filbert is Medical Director for [place].  Dr. Ottie Glazier is Medical Director for [facility] Pulmonary Rehabilitation.

## 2022-05-25 ENCOUNTER — Ambulatory Visit (INDEPENDENT_AMBULATORY_CARE_PROVIDER_SITE_OTHER): Payer: Medicare Other | Admitting: Urology

## 2022-05-25 VITALS — BP 112/68 | HR 78 | Ht 65.0 in | Wt 185.7 lb

## 2022-05-25 DIAGNOSIS — Z87440 Personal history of urinary (tract) infections: Secondary | ICD-10-CM

## 2022-05-25 DIAGNOSIS — N200 Calculus of kidney: Secondary | ICD-10-CM

## 2022-05-25 DIAGNOSIS — N390 Urinary tract infection, site not specified: Secondary | ICD-10-CM

## 2022-05-25 NOTE — Progress Notes (Signed)
   05/25/2022 4:21 PM   Amber Holder 1946/08/21 [id number]  Reason for visit: Follow up recurrent UTI, nephrolithiasis  HPI: 76 year old female who previously was managed by Dr. Jacqlyn Larsen for the above issues.  She previously was on long-term nitrofurantoin prophylaxis, and we were able to discontinue this and change her over to cranberry tablet prophylaxis.  She has a known 6 mm nonobstructing left upper pole stone that has been stable.  I personally viewed and interpreted her KUB today that shows no significant change in the left upper pole stone.  She has had 1 documented culture proven UTI in the last year.  She has had a number of pulmonary and cardiac health issues over the last year and is undergoing work-up for possible valve dysfunction.  She is currently on oxygen.  She also has significant lower extremity edema on torsemide.  Renal function is normal.  Outside notes reviewed extensively.  Recent urinalysis 05/19/2022 benign.  -Continue cranberry tablets for UTI prophylaxis -RTC 1 year KUB prior -Consider topical estrogen cream in the future or left ureteroscopy/laser lithotripsy if more than 2 infections per year moving forward.  With her worsening health issues would not recommend any invasive treatments at this time unless significant change in symptoms  [place], [place] [address], [place], [place] [address] [phone]

## 2022-05-26 ENCOUNTER — Ambulatory Visit (INDEPENDENT_AMBULATORY_CARE_PROVIDER_SITE_OTHER): Payer: Medicare Other | Admitting: Vascular Surgery

## 2022-05-29 ENCOUNTER — Telehealth: Payer: Self-pay

## 2022-05-29 ENCOUNTER — Encounter: Payer: Medicare Other | Attending: Internal Medicine | Admitting: *Deleted

## 2022-05-29 DIAGNOSIS — J449 Chronic obstructive pulmonary disease, unspecified: Secondary | ICD-10-CM | POA: Insufficient documentation

## 2022-05-29 DIAGNOSIS — R0609 Other forms of dyspnea: Secondary | ICD-10-CM

## 2022-05-29 LAB — GLUCOSE, CAPILLARY
Glucose-Capillary: 144 mg/dL — ABNORMAL HIGH (ref 70–99)
Glucose-Capillary: 115 mg/dL — ABNORMAL HIGH (ref 70–99)

## 2022-05-29 MED ORDER — POTASSIUM CHLORIDE CRYS ER 20 MEQ PO TBCR: EXTENDED_RELEASE_TABLET | ORAL | 2 refills | Status: DC

## 2022-05-29 NOTE — Telephone Encounter (Signed)
Called patient and reviewed Cardiac MRI instructions including lab work that will need to be done.   Patient informed me that her Potassium is running on the low side, so I checked her recent lab work and she has had 3 draws over the last month where it has ranged from 3.1-3.4. I spoke with Dr. Garen Lah and he recommended that she increase her Potassium to 60 MEQ every AM and 40 MEQ every PM. I informed patient of this and she verbalized understanding and agreed with plan.   I will add a BMP on to the H/H lab order, patient will have drawn in a week, prior to her Cardiac MRI.

## 2022-05-29 NOTE — Progress Notes (Signed)
Daily Session Note  Patient Details  Name: Amber Holder MRN: [id number] Date of Birth: 10/21/1945 Referring Provider:   Flowsheet Row Pulmonary Rehab from 05/23/2022 in [facility] Cardiac and Pulmonary Rehab  Referring Provider Clayborn Bigness MD       Encounter Date: 05/29/2022  Check In:  Session Check In - 05/29/22 0947       Check-In   Supervising physician immediately available to respond to emergencies See telemetry face sheet for immediately available ER MD    Location [facility]-Cardiac & Pulmonary Rehab    Staff Present Earlean Shawl, BS, ACSM CEP, Exercise Physiologist;Noah Tickle, BS, Exercise Physiologist;Mayling Aber Tamala Julian, RN, ADN    Virtual Visit No    Medication changes reported     No    Fall or balance concerns reported    No    Resistance Training Performed Yes    VAD Patient? No    PAD/SET Patient? No      Pain Assessment   Currently in Pain? No/denies                Social History   Tobacco Use  Smoking Status Former   Packs/day: 1.00   Years: 50.00   Total pack years: 50.00   Types: Cigarettes   Quit date: 12/28/2021   Years since quitting: 0.4   Passive exposure: Never  Smokeless Tobacco Never  Tobacco Comments   1 pack daily//quit 12/28/2021    Goals Met:  Independence with exercise equipment Exercise tolerated well No report of concerns or symptoms today Strength training completed today  Goals Unmet:  Not Applicable  Comments: Pt able to follow exercise prescription today without complaint.  Will continue to monitor for progression.    Dr. Emily Filbert is Medical Director for [place].  Dr. Ottie Glazier is Medical Director for [facility] Pulmonary Rehabilitation.

## 2022-05-29 NOTE — Telephone Encounter (Signed)
Received the following secure chat below from Dr. Garen Lah.

## 2022-05-31 ENCOUNTER — Encounter: Payer: Self-pay | Admitting: *Deleted

## 2022-05-31 ENCOUNTER — Encounter: Payer: Medicare Other | Admitting: *Deleted

## 2022-05-31 DIAGNOSIS — J449 Chronic obstructive pulmonary disease, unspecified: Secondary | ICD-10-CM

## 2022-05-31 DIAGNOSIS — R0609 Other forms of dyspnea: Secondary | ICD-10-CM | POA: Diagnosis not present

## 2022-05-31 LAB — GLUCOSE, CAPILLARY
Glucose-Capillary: 123 mg/dL — ABNORMAL HIGH (ref 70–99)
Glucose-Capillary: 111 mg/dL — ABNORMAL HIGH (ref 70–99)

## 2022-05-31 NOTE — Progress Notes (Signed)
Pulmonary Individual Treatment Plan  Patient Details  Name: Tamira Ryland MRN: [id number] Date of Birth: 02-27-1946 Referring Provider:   Flowsheet Row Pulmonary Rehab from 05/23/2022 in [facility] Cardiac and Pulmonary Rehab  Referring Provider Clayborn Bigness MD       Initial Encounter Date:  Flowsheet Row Pulmonary Rehab from 05/23/2022 in [facility] Cardiac and Pulmonary Rehab  Date 05/23/22       Visit Diagnosis: Chronic obstructive pulmonary disease, unspecified COPD type ([place])  Patient's Home Medications on Admission:  Current Outpatient Medications:    acetaminophen (TYLENOL) 500 MG chewable tablet, Chew 1,000 mg by mouth every 8 (eight) hours as needed for pain., Disp: , Rfl:    albuterol (VENTOLIN HFA) 108 (90 Base) MCG/ACT inhaler, Inhale 2 puffs into the lungs every 6 (six) hours as needed for wheezing or shortness of breath., Disp: 18 g, Rfl: 2   ALPRAZolam (XANAX) 0.25 MG tablet, Take one tab po bid for anxiety prn, Disp: 30 tablet, Rfl: 1   aspirin EC 81 MG tablet, Take 1 tablet (81 mg total) by mouth daily. Swallow whole., Disp: 90 tablet, Rfl: 3   atorvastatin (LIPITOR) 20 MG tablet, Take 1 tablet (20 mg total) by mouth daily., Disp: 90 tablet, Rfl: 3   atorvastatin (LIPITOR) 40 MG tablet, Take 40 mg by mouth daily., Disp: , Rfl:    buPROPion (WELLBUTRIN XL) 150 MG 24 hr tablet, TAKE 1 TABLET BY MOUTH EVERY DAY, Disp: 90 tablet, Rfl: 2   cholecalciferol (VITAMIN D) 400 UNITS TABS tablet, Take 2,000 Units by mouth daily.  (Patient not taking: Reported on 05/25/2022), Disp: , Rfl:    CRANBERRY PO, Take 25,000 mg by mouth daily., Disp: , Rfl:    dapagliflozin propanediol (FARXIGA) 10 MG TABS tablet, Take 1 tablet (10 mg total) by mouth daily before breakfast., Disp: 90 tablet, Rfl: 3   diphenhydrAMINE (BENADRYL) 50 MG tablet, Take one tab 13 hrs, 7 hrs and 1 hr before procedure along with prednisone, may take one extra if experienced itching or hives, Disp: 4 tablet, Rfl: 0    ergocalciferol (DRISDOL) 1.25 MG (50000 UT) capsule, Take one cap q week (Patient not taking: Reported on 05/25/2022), Disp: 12 capsule, Rfl: 3   fluconazole (DIFLUCAN) 150 MG tablet, Take one tab po q week for fungal infection, Disp: 4 tablet, Rfl: 0   fluticasone (FLOVENT HFA) 110 MCG/ACT inhaler, Inhale 2 puffs into the lungs 2 (two) times daily as needed., Disp: , Rfl:    furosemide (LASIX) 20 MG tablet, Take by mouth., Disp: , Rfl:    glucose blood (ONETOUCH VERIO) test strip, BLOOD SUGAR TESTING THREE TIMES DAILY . DX E11.65, Disp: 100 strip, Rfl: 3   hydrocortisone cream 0.5 %, Apply topically as needed. , Disp: , Rfl:    hydrocortisone cream 0.5 %, Apply topically., Disp: , Rfl:    ibandronate (BONIVA) 150 MG tablet, Take 1 tablet (150 mg total) by mouth every 30 (thirty) days., Disp: 3 tablet, Rfl: 3   Lancets (ONETOUCH DELICA PLUS [id number]) MISC, Use  as directed twice a daily DX E11.65, Disp: 100 each, Rfl: 3   Magnesium 250 MG TABS, Take by mouth. Takes 1 tablet 2-3 times per week, Disp: , Rfl:    metFORMIN (GLUCOPHAGE) 500 MG tablet, Take 500 mg by mouth daily. with dinner, Disp: , Rfl:    metolazone (ZAROXOLYN) 5 MG tablet, Take 0.5 tablets (2.5 mg total) by mouth daily. Take 30 mins prior to your Torsemide., Disp: 30 tablet, Rfl: 1  montelukast (SINGULAIR) 10 MG tablet, Take 10 mg by mouth daily as needed., Disp: , Rfl:    Multiple Vitamins-Minerals (MULTIVITAMIN ADULTS PO), Take 1 tablet by mouth daily., Disp: , Rfl:    omeprazole (PRILOSEC) 40 MG capsule, Take 1 capsule (40 mg total) by mouth daily., Disp: , Rfl:    OXYGEN, Inhale 3 L into the lungs. Pt uses [facility] for Oxygen, Disp: , Rfl:    potassium chloride SA (KLOR-CON M) 20 MEQ tablet, TAKE 3 TABS (60 MEQ) IN AM AND 2 TABS (40 MEQ) IN THE PM., Disp: 300 tablet, Rfl: 2   predniSONE & diphenhydrAMINE (CONTRAST ALLERGY PREMED PACK) 3 x 50 MG & 1 x 50 MG KIT, Take 50 mg by mouth Once PRN for up to 1 dose (TAKE 13 hour  prior to CT angio)., Disp: 1 kit, Rfl: 0   predniSONE (DELTASONE) 50 MG tablet, Take one tab 13 hrs, 7 hrs and 1 hr before scheduled time along with Benadryl, may take one extra after the procedure if experienced itching and hives, Disp: 4 tablet, Rfl: 0   Simethicone 80 MG TABS, Take 1 tablet (80 mg total) by mouth as needed (burping). As directed., Disp: , Rfl: 0   Tiotropium Bromide-Olodaterol 2.5-2.5 MCG/ACT AERS, Inhale 2 puffs into the lungs daily., Disp: 1 each, Rfl: 4   torsemide (DEMADEX) 20 MG tablet, Take 1 tablet (20 mg total) by mouth daily., Disp: 30 tablet, Rfl: 1  Past Medical History: Past Medical History:  Diagnosis Date   Anxiety 01/02/2012   Arthritis    Asthma    Blockage of coronary artery of heart (HCC)    Chronic kidney disease    Kidney stones   Colon polyps    COPD (chronic obstructive pulmonary disease) (HCC)    Diabetes mellitus without complication (HCC)    GERD (gastroesophageal reflux disease)    HOH (hard of hearing)    Bilateral hearing aids   Kidney stone    Osteoporosis    Sleep apnea    Does not use C-PAP on a regular basis   Venous stasis     Tobacco Use: Social History   Tobacco Use  Smoking Status Former   Packs/day: 1.00   Years: 50.00   Total pack years: 50.00   Types: Cigarettes   Quit date: 12/28/2021   Years since quitting: 0.4   Passive exposure: Never  Smokeless Tobacco Never  Tobacco Comments   1 pack daily//quit 12/28/2021    Labs: Review Flowsheet  More data exists      Latest Ref Rng & Units 10/03/2021 01/10/2022 01/16/2022 04/24/2022 05/19/2022  Labs for ITP Cardiac and Pulmonary Rehab  Cholestrol 0 - 200 mg/dL - - 261  - 162   LDL (calc) 0 - 99 mg/dL - - 182  - 92   HDL-C >40 mg/dL - - 52  - 48   Trlycerides <150 mg/dL - - 134  - 111   Hemoglobin A1c 4.0 - 5.6 % 5.7  6.5  - 6.2  -     Pulmonary Assessment Scores:  Pulmonary Assessment Scores     Row Name 05/23/22 1415         ADL UCSD   ADL Phase Entry      SOB Score total 62     Rest 2     Walk 3     Stairs 5     Bath 2     Dress 2     Shop  3       CAT Score   CAT Score 17       mMRC Score   mMRC Score 2              UCSD: Self-administered rating of dyspnea associated with activities of daily living (ADLs) 6-point scale (0 = "not at all" to 5 = "maximal or unable to do because of breathlessness")  Scoring Scores range from 0 to 120.  Minimally important difference is 5 units  CAT: CAT can identify the health impairment of COPD patients and is better correlated with disease progression.  CAT has a scoring range of zero to 40. The CAT score is classified into four groups of low (less than 10), medium (10 - 20), high (21-30) and very high (31-40) based on the impact level of disease on health status. A CAT score over 10 suggests significant symptoms.  A worsening CAT score could be explained by an exacerbation, poor medication adherence, poor inhaler technique, or progression of COPD or comorbid conditions.  CAT MCID is 2 points  mMRC: mMRC (Modified Medical Research Council) Dyspnea Scale is used to assess the degree of baseline functional disability in patients of respiratory disease due to dyspnea. No minimal important difference is established. A decrease in score of 1 point or greater is considered a positive change.   Pulmonary Function Assessment:  Pulmonary Function Assessment - 05/15/22 1052       Breath   Shortness of Breath Yes;Limiting activity             Exercise Target Goals: Exercise Program Goal: Individual exercise prescription set using results from initial 6 min walk test and THRR while considering  patient's activity barriers and safety.   Exercise Prescription Goal: Initial exercise prescription builds to 30-45 minutes a day of aerobic activity, 2-3 days per week.  Home exercise guidelines will be given to patient during program as part of exercise prescription that the participant will  acknowledge.  Education: Aerobic Exercise: - Group verbal and visual presentation on the components of exercise prescription. Introduces F.I.T.T principle from ACSM for exercise prescriptions.  Reviews F.I.T.T. principles of aerobic exercise including progression. Written material given at graduation.   Education: Resistance Exercise: - Group verbal and visual presentation on the components of exercise prescription. Introduces F.I.T.T principle from ACSM for exercise prescriptions  Reviews F.I.T.T. principles of resistance exercise including progression. Written material given at graduation.    Education: Exercise & Equipment Safety: - Individual verbal instruction and demonstration of equipment use and safety with use of the equipment. Flowsheet Row Pulmonary Rehab from 05/24/2022 in [facility] Cardiac and Pulmonary Rehab  Date 05/15/22  Educator [facility]  Instruction Review Code 1- Verbalizes Understanding       Education: Exercise Physiology & General Exercise Guidelines: - Group verbal and written instruction with models to review the exercise physiology of the cardiovascular system and associated critical values. Provides general exercise guidelines with specific guidelines to those with heart or lung disease.    Education: Flexibility, Balance, Mind/Body Relaxation: - Group verbal and visual presentation with interactive activity on the components of exercise prescription. Introduces F.I.T.T principle from ACSM for exercise prescriptions. Reviews F.I.T.T. principles of flexibility and balance exercise training including progression. Also discusses the mind body connection.  Reviews various relaxation techniques to help reduce and manage stress (i.e. Deep breathing, progressive muscle relaxation, and visualization). Balance handout provided to take home. Written material given at graduation.   Activity Barriers & Risk Stratification:  Activity Barriers & Cardiac Risk Stratification - 05/23/22  1452       Activity Barriers & Cardiac Risk Stratification   Activity Barriers Right Hip Replacement;Deconditioning;Muscular Weakness;Shortness of Breath;Decreased Ventricular Function             6 Minute Walk:  6 Minute Walk     Row Name 05/23/22 1450         6 Minute Walk   Phase Initial     Distance 990 feet     Walk Time 6 minutes     # of Rest Breaks 0     MPH 1.87     METS 1.62     RPE 15     Perceived Dyspnea  3     VO2 Peak 5.67     Symptoms Yes (comment)     Comments Right hip tightness, SOB     Resting HR 72 bpm     Resting BP 106/66     Resting Oxygen Saturation  93 %     Exercise Oxygen Saturation  during 6 min walk 87 %     Max Ex. HR 90 bpm     Max Ex. BP 124/62     2 Minute Post BP 104/66       Interval HR   1 Minute HR 83     2 Minute HR 85     3 Minute HR 87     4 Minute HR 88     5 Minute HR 89     6 Minute HR 90     2 Minute Post HR 77     Interval Heart Rate? Yes       Interval Oxygen   Interval Oxygen? Yes     Baseline Oxygen Saturation % 93 %     1 Minute Oxygen Saturation % 92 %     1 Minute Liters of Oxygen 3 L     2 Minute Oxygen Saturation % 88 %     2 Minute Liters of Oxygen 3 L     3 Minute Oxygen Saturation % 87 %     3 Minute Liters of Oxygen 3 L     4 Minute Oxygen Saturation % 88 %     4 Minute Liters of Oxygen 3 L     5 Minute Oxygen Saturation % 87 %     5 Minute Liters of Oxygen 3 L     6 Minute Oxygen Saturation % 87 %     6 Minute Liters of Oxygen 3 L     2 Minute Post Oxygen Saturation % 94 %     2 Minute Post Liters of Oxygen 3 L             Oxygen Initial Assessment:  Oxygen Initial Assessment - 05/15/22 1050       Home Oxygen   Home Oxygen Device Home Concentrator;E-Tanks;Portable Concentrator    Sleep Oxygen Prescription Continuous    Home Exercise Oxygen Prescription Continuous    Liters per minute 3    Home Resting Oxygen Prescription Continuous    Liters per minute 2    Compliance with  Home Oxygen Use Yes      Initial 6 min Walk   Oxygen Used Continuous;Portable Concentrator    Liters per minute 3      Program Oxygen Prescription   Program Oxygen Prescription Continuous    Liters per minute 3      Intervention  Short Term Goals To learn and exhibit compliance with exercise, home and travel O2 prescription;To learn and understand importance of monitoring SPO2 with pulse oximeter and demonstrate accurate use of the pulse oximeter.;To learn and understand importance of maintaining oxygen saturations>88%;To learn and demonstrate proper pursed lip breathing techniques or other breathing techniques. ;To learn and demonstrate proper use of respiratory medications    Long  Term Goals Exhibits compliance with exercise, home  and travel O2 prescription;Verbalizes importance of monitoring SPO2 with pulse oximeter and return demonstration;Maintenance of O2 saturations>88%;Exhibits proper breathing techniques, such as pursed lip breathing or other method taught during program session;Compliance with respiratory medication;Demonstrates proper use of MDI's             Oxygen Re-Evaluation:  Oxygen Re-Evaluation     Row Name 05/24/22 1029             Goals/Expected Outcomes   Comments Reviewed PLB technique with pt.  Talked about how it works and it's importance in maintaining their exercise saturations.       Goals/Expected Outcomes Short: Become more profiecient at using PLB.   Long: Become independent at using PLB.                Oxygen Discharge (Final Oxygen Re-Evaluation):  Oxygen Re-Evaluation - 05/24/22 1029       Goals/Expected Outcomes   Comments Reviewed PLB technique with pt.  Talked about how it works and it's importance in maintaining their exercise saturations.    Goals/Expected Outcomes Short: Become more profiecient at using PLB.   Long: Become independent at using PLB.             Initial Exercise Prescription:  Initial Exercise Prescription  - 05/23/22 1400       Date of Initial Exercise RX and Referring Provider   Date 05/23/22    Referring Provider Clayborn Bigness MD      Oxygen   Oxygen Continuous    Liters 3    Maintain Oxygen Saturation 88% or higher      Treadmill   MPH 1.4    Grade 0    Minutes 15    METs 2.07      NuStep   Level 1    SPM 80    Minutes 15    METs 1.6      REL-XR   Level 1    Speed 50    Minutes 15    METs 1.6      Prescription Details   Frequency (times per week) 3    Duration Progress to 30 minutes of continuous aerobic without signs/symptoms of physical distress      Intensity   THRR 40-80% of Max Heartrate 100 - 129    Ratings of Perceived Exertion 11-13    Perceived Dyspnea 0-4      Progression   Progression Continue to progress workloads to maintain intensity without signs/symptoms of physical distress.      Resistance Training   Training Prescription Yes    Weight 3 lb    Reps 10-15             Perform Capillary Blood Glucose checks as needed.  Exercise Prescription Changes:   Exercise Prescription Changes     Row Name 05/23/22 1400             Response to Exercise   Blood Pressure (Admit) 106/66       Blood Pressure (Exercise) 124/62       Blood  Pressure (Exit) 104/66       Heart Rate (Admit) 72 bpm       Heart Rate (Exercise) 90 bpm       Heart Rate (Exit) 65 bpm       Oxygen Saturation (Admit) 93 %       Oxygen Saturation (Exercise) 87 %       Oxygen Saturation (Exit) 94 %       Rating of Perceived Exertion (Exercise) 15       Perceived Dyspnea (Exercise) 3       Symptoms SOB, right hip tightness       Comments walk test results                Exercise Comments:   Exercise Goals and Review:   Exercise Goals     Row Name 05/23/22 1456             Exercise Goals   Increase Physical Activity Yes       Intervention Provide advice, education, support and counseling about physical activity/exercise needs.;Develop an individualized  exercise prescription for aerobic and resistive training based on initial evaluation findings, risk stratification, comorbidities and participant's personal goals.       Expected Outcomes Short Term: Attend rehab on a regular basis to increase amount of physical activity.;Long Term: Add in home exercise to make exercise part of routine and to increase amount of physical activity.;Long Term: Exercising regularly at least 3-5 days a week.       Increase Strength and Stamina Yes       Intervention Provide advice, education, support and counseling about physical activity/exercise needs.;Develop an individualized exercise prescription for aerobic and resistive training based on initial evaluation findings, risk stratification, comorbidities and participant's personal goals.       Expected Outcomes Short Term: Increase workloads from initial exercise prescription for resistance, speed, and METs.;Short Term: Perform resistance training exercises routinely during rehab and add in resistance training at home;Long Term: Improve cardiorespiratory fitness, muscular endurance and strength as measured by increased METs and functional capacity (6MWT)       Able to understand and use rate of perceived exertion (RPE) scale Yes       Intervention Provide education and explanation on how to use RPE scale       Expected Outcomes Short Term: Able to use RPE daily in rehab to express subjective intensity level;Long Term:  Able to use RPE to guide intensity level when exercising independently       Able to understand and use Dyspnea scale Yes       Intervention Provide education and explanation on how to use Dyspnea scale       Expected Outcomes Short Term: Able to use Dyspnea scale daily in rehab to express subjective sense of shortness of breath during exertion;Long Term: Able to use Dyspnea scale to guide intensity level when exercising independently       Knowledge and understanding of Target Heart Rate Range (THRR) Yes        Intervention Provide education and explanation of THRR including how the numbers were predicted and where they are located for reference       Expected Outcomes Short Term: Able to state/look up THRR;Long Term: Able to use THRR to govern intensity when exercising independently;Short Term: Able to use daily as guideline for intensity in rehab       Able to check pulse independently Yes       Intervention Provide education  and demonstration on how to check pulse in carotid and radial arteries.;Review the importance of being able to check your own pulse for safety during independent exercise       Expected Outcomes Short Term: Able to explain why pulse checking is important during independent exercise;Long Term: Able to check pulse independently and accurately       Understanding of Exercise Prescription Yes       Intervention Provide education, explanation, and written materials on patient's individual exercise prescription       Expected Outcomes Short Term: Able to explain program exercise prescription;Long Term: Able to explain home exercise prescription to exercise independently                Exercise Goals Re-Evaluation :  Exercise Goals Re-Evaluation     Row Name 05/24/22 1010             Exercise Goal Re-Evaluation   Comments Reviewed RPE and dyspnea scales, THR and program prescription with pt today.  Pt voiced understanding and was given a copy of goals to take home.       Expected Outcomes Short: Use RPE daily to regulate intensity. Long: Follow program prescription in THR.                Discharge Exercise Prescription (Final Exercise Prescription Changes):  Exercise Prescription Changes - 05/23/22 1400       Response to Exercise   Blood Pressure (Admit) 106/66    Blood Pressure (Exercise) 124/62    Blood Pressure (Exit) 104/66    Heart Rate (Admit) 72 bpm    Heart Rate (Exercise) 90 bpm    Heart Rate (Exit) 65 bpm    Oxygen Saturation (Admit) 93 %    Oxygen  Saturation (Exercise) 87 %    Oxygen Saturation (Exit) 94 %    Rating of Perceived Exertion (Exercise) 15    Perceived Dyspnea (Exercise) 3    Symptoms SOB, right hip tightness    Comments walk test results             Nutrition:  Target Goals: Understanding of nutrition guidelines, daily intake of sodium <155m, cholesterol <2033m calories 30% from fat and 7% or less from saturated fats, daily to have 5 or more servings of fruits and vegetables.  Education: All About Nutrition: -Group instruction provided by verbal, written material, interactive activities, discussions, models, and posters to present general guidelines for heart healthy nutrition including fat, fiber, MyPlate, the role of sodium in heart healthy nutrition, utilization of the nutrition label, and utilization of this knowledge for meal planning. Follow up email sent as well. Written material given at graduation. [facility]  Date 05/24/22  [facility] 1- Verbalizes Understanding       Biometrics:  Pre Biometrics - 05/23/22 1449       Pre Biometrics   Height 5' 4.6" (1.641 m)    Weight 186 lb 6.4 oz (84.6 kg)    BMI (Calculated) 31.4    Single Leg Stand 7.3 seconds              Nutrition Therapy Plan and Nutrition Goals:  Nutrition Therapy & Goals - 05/23/22 1444       Personal Nutrition Goals   Comments Would prefer an in-person appointment due to her hearing.      Intervention Plan   Intervention Prescribe, educate and counsel regarding individualized specific dietary modifications  aiming towards targeted core components such as weight, hypertension, lipid management, diabetes, heart failure and other comorbidities.    Expected Outcomes Short Term Goal: Understand basic principles of dietary content, such as calories, fat, sodium, cholesterol and nutrients.;Short Term Goal: A plan has been developed with  personal nutrition goals set during dietitian appointment.;Long Term Goal: Adherence to prescribed nutrition plan.             Nutrition Assessments:  MEDIFICTS Score Key: ?70 Need to make dietary changes  40-70 Heart Healthy Diet ? 40 Therapeutic Level Cholesterol Diet  Flowsheet Row Pulmonary Rehab from 05/23/2022 in [facility] Cardiac and Pulmonary Rehab  Picture Your Plate Total Score on Admission 68      Picture Your Plate Scores: <50 Unhealthy dietary pattern with much room for improvement. 41-50 Dietary pattern unlikely to meet recommendations for good health and room for improvement. 51-60 More healthful dietary pattern, with some room for improvement.  >60 Healthy dietary pattern, although there may be some specific behaviors that could be improved.   Nutrition Goals Re-Evaluation:   Nutrition Goals Discharge (Final Nutrition Goals Re-Evaluation):   Psychosocial: Target Goals: Acknowledge presence or absence of significant depression and/or stress, maximize coping skills, provide positive support system. Participant is able to verbalize types and ability to use techniques and skills needed for reducing stress and depression.   Education: Stress, Anxiety, and Depression - Group verbal and visual presentation to define topics covered.  Reviews how body is impacted by stress, anxiety, and depression.  Also discusses healthy ways to reduce stress and to treat/manage anxiety and depression.  Written material given at graduation.   Education: Sleep Hygiene -Provides group verbal and written instruction about how sleep can affect your health.  Define sleep hygiene, discuss sleep cycles and impact of sleep habits. Review good sleep hygiene tips.    Initial Review & Psychosocial Screening:  Initial Psych Review & Screening - 05/15/22 1054       Initial Review   Current issues with Current Psychotropic Meds      Family Dynamics   Good Support System? Yes    Comments She  took WellButrin for awhile to quit smoking and she did. She has a good family support system.      Barriers   Psychosocial barriers to participate in program The patient should benefit from training in stress management and relaxation.      Screening Interventions   Interventions Encouraged to exercise;To provide support and resources with identified psychosocial needs;Provide feedback about the scores to participant    Expected Outcomes Short Term goal: Utilizing psychosocial counselor, staff and physician to assist with identification of specific Stressors or current issues interfering with healing process. Setting desired goal for each stressor or current issue identified.;Long Term Goal: Stressors or current issues are controlled or eliminated.;Short Term goal: Identification and review with participant of any Quality of Life or Depression concerns found by scoring the questionnaire.;Long Term goal: The participant improves quality of Life and PHQ9 Scores as seen by post scores and/or verbalization of changes             Quality of Life Scores:  Scores of 19 and below usually indicate a poorer quality of life in these areas.  A difference of  2-3 points is a clinically meaningful difference.  A difference of 2-3 points in the total score of the Quality of Life Index has been associated with significant improvement in overall quality of life, self-image, physical symptoms, and general health  in studies assessing change in quality of life.  PHQ-9: Review Flowsheet  More data exists      05/23/2022 05/19/2022 05/18/2022 02/14/2022 12/06/2021  Depression screen PHQ 2/9  Decreased Interest 1 0 0 0 0  Down, Depressed, Hopeless 0 0 0 0 0  PHQ - 2 Score 1 0 0 0 0  Altered sleeping 3 - - - -  Tired, decreased energy 2 - - - -  Change in appetite 3 - - - -  Feeling bad or failure about yourself  0 - - - -  Trouble concentrating 2 - - - -  Moving slowly or fidgety/restless 0 - - - -  Suicidal  thoughts 0 - - - -  PHQ-9 Score 11 - - - -  Difficult doing work/chores Not difficult at all - - - -   Interpretation of Total Score  Total Score Depression Severity:  1-4 = Minimal depression, 5-9 = Mild depression, 10-14 = Moderate depression, 15-19 = Moderately severe depression, 20-27 = Severe depression   Psychosocial Evaluation and Intervention:  Psychosocial Evaluation - 05/15/22 1056       Psychosocial Evaluation & Interventions   Interventions Encouraged to exercise with the program and follow exercise prescription;Stress management education;Relaxation education    Comments She took WellButrin for awhile to quit smoking and she did. She has a good family support system.    Expected Outcomes Short: Start [facility] to help with mood. Long: Maintain a healthy mental state.    Continue Psychosocial Services  Follow up required by staff             Psychosocial Re-Evaluation:   Psychosocial Discharge (Final Psychosocial Re-Evaluation):   Education: Education Goals: Education classes will be provided on a weekly basis, covering required topics. Participant will state understanding/return demonstration of topics presented.  Learning Barriers/Preferences:  Learning Barriers/Preferences - 05/15/22 1052       Learning Barriers/Preferences   Learning Barriers Hearing    Learning Preferences None             General Pulmonary Education Topics:  Infection Prevention: - Provides verbal and written material to individual with discussion of infection control including proper hand washing and proper equipment cleaning during exercise session. Flowsheet Row Pulmonary Rehab from 05/24/2022 in [facility] Cardiac and Pulmonary Rehab  Date 05/15/22  Educator [facility]  Instruction Review Code 1- Verbalizes Understanding       Falls Prevention: - Provides verbal and written material to individual with discussion of falls prevention and safety. Flowsheet Row Pulmonary Rehab from  05/24/2022 in [facility] Cardiac and Pulmonary Rehab  Date 05/15/22  Educator [facility]  Instruction Review Code 1- Verbalizes Understanding       Chronic Lung Disease Review: - Group verbal instruction with posters, models, PowerPoint presentations and videos,  to review new updates, new respiratory medications, new advancements in procedures and treatments. Providing information on websites and "800" numbers for continued self-education. Includes information about supplement oxygen, available portable oxygen systems, continuous and intermittent flow rates, oxygen safety, concentrators, and Medicare reimbursement for oxygen. Explanation of Pulmonary Drugs, including class, frequency, complications, importance of spacers, rinsing mouth after steroid MDI's, and proper cleaning methods for nebulizers. Review of basic lung anatomy and physiology related to function, structure, and complications of lung disease. Review of risk factors. Discussion about methods for diagnosing sleep apnea and types of masks and machines for OSA. Includes a review of the use of types of environmental controls: home humidity, furnaces, filters, dust mite/pet prevention,  HEPA vacuums. Discussion about weather changes, air quality and the benefits of nasal washing. Instruction on Warning signs, infection symptoms, calling MD promptly, preventive modes, and value of vaccinations. Review of effective airway clearance, coughing and/or vibration techniques. Emphasizing that all should Create an Action Plan. Written material given at graduation. Flowsheet Row Pulmonary Rehab from 05/24/2022 in [facility] Cardiac and Pulmonary Rehab  Education need identified 05/23/22       AED/CPR: - Group verbal and written instruction with the use of models to demonstrate the basic use of the AED with the basic ABC's of resuscitation.    Anatomy and Cardiac Procedures: - Group verbal and visual presentation and models provide information about basic cardiac  anatomy and function. Reviews the testing methods done to diagnose heart disease and the outcomes of the test results. Describes the treatment choices: Medical Management, Angioplasty, or Coronary Bypass Surgery for treating various heart conditions including Myocardial Infarction, Angina, Valve Disease, and Cardiac Arrhythmias.  Written material given at graduation.   Medication Safety: - Group verbal and visual instruction to review commonly prescribed medications for heart and lung disease. Reviews the medication, class of the drug, and side effects. Includes the steps to properly store meds and maintain the prescription regimen.  Written material given at graduation.   Other: -Provides group and verbal instruction on various topics (see comments)   Knowledge Questionnaire Score:    Core Components/Risk Factors/Patient Goals at Admission:  Personal Goals and Risk Factors at Admission - 05/23/22 1459       Core Components/Risk Factors/Patient Goals on Admission    Weight Management Yes;Weight Loss;Obesity    Intervention Weight Management: Develop a combined nutrition and exercise program designed to reach desired caloric intake, while maintaining appropriate intake of nutrient and fiber, sodium and fats, and appropriate energy expenditure required for the weight goal.;Weight Management: Provide education and appropriate resources to help participant work on and attain dietary goals.;Weight Management/Obesity: Establish reasonable short term and long term weight goals.;Obesity: Provide education and appropriate resources to help participant work on and attain dietary goals.    Admit Weight 186 lb 6.4 oz (84.6 kg)    Goal Weight: Short Term 181 lb (82.1 kg)    Goal Weight: Long Term 175 lb (79.4 kg)    Expected Outcomes Short Term: Continue to assess and modify interventions until short term weight is achieved;Long Term: Adherence to nutrition and physical activity/exercise program aimed  toward attainment of established weight goal;Weight Loss: Understanding of general recommendations for a balanced deficit meal plan, which promotes 1-2 lb weight loss per week and includes a negative energy balance of [phone] kcal/d;Understanding recommendations for meals to include 15-35% energy as protein, 25-35% energy from fat, 35-60% energy from carbohydrates, less than 250m of dietary cholesterol, 20-35 gm of total fiber daily;Understanding of distribution of calorie intake throughout the day with the consumption of 4-5 meals/snacks    Tobacco Cessation Yes   Quit May 2023   Number of packs per day 0    Intervention Assist the participant in steps to quit. Provide individualized education and counseling about committing to Tobacco Cessation, relapse prevention, and pharmacological support that can be provided by physician.;OAdvice worker assist with locating and accessing local/national Quit Smoking programs, and support quit date choice.    Expected Outcomes Short Term: Will demonstrate readiness to quit, by selecting a quit date.;Short Term: Will quit all tobacco product use, adhering to prevention of relapse plan.;Long Term: Complete abstinence from all tobacco products for at least  12 months from quit date.    Improve shortness of breath with ADL's Yes    Intervention Provide education, individualized exercise plan and daily activity instruction to help decrease symptoms of SOB with activities of daily living.    Expected Outcomes Short Term: Improve cardiorespiratory fitness to achieve a reduction of symptoms when performing ADLs;Long Term: Be able to perform more ADLs without symptoms or delay the onset of symptoms    Diabetes Yes    Intervention Provide education about signs/symptoms and action to take for hypo/hyperglycemia.;Provide education about proper nutrition, including hydration, and aerobic/resistive exercise prescription along with prescribed medications to achieve  blood glucose in normal ranges: Fasting glucose 65-99 mg/dL    Expected Outcomes Short Term: Participant verbalizes understanding of the signs/symptoms and immediate care of hyper/hypoglycemia, proper foot care and importance of medication, aerobic/resistive exercise and nutrition plan for blood glucose control.;Long Term: Attainment of HbA1C < 7%.    Heart Failure Yes    Intervention Provide a combined exercise and nutrition program that is supplemented with education, support and counseling about heart failure. Directed toward relieving symptoms such as shortness of breath, decreased exercise tolerance, and extremity edema.    Expected Outcomes Improve functional capacity of life;Short term: Attendance in program 2-3 days a week with increased exercise capacity. Reported lower sodium intake. Reported increased fruit and vegetable intake. Reports medication compliance.;Short term: Daily weights obtained and reported for increase. Utilizing diuretic protocols set by physician.;Long term: Adoption of self-care skills and reduction of barriers for early signs and symptoms recognition and intervention leading to self-care maintenance.             Education:Diabetes - Individual verbal and written instruction to review signs/symptoms of diabetes, desired ranges of glucose level fasting, after meals and with exercise. Acknowledge that pre and post exercise glucose checks will be done for 3 sessions at entry of program. Flowsheet Row Pulmonary Rehab from 05/24/2022 in [facility] Cardiac and Pulmonary Rehab  Date 05/15/22  Educator [facility]  Instruction Review Code 1- Verbalizes Understanding       Know Your Numbers and Heart Failure: - Group verbal and visual instruction to discuss disease risk factors for cardiac and pulmonary disease and treatment options.  Reviews associated critical values for Overweight/Obesity, Hypertension, Cholesterol, and Diabetes.  Discusses basics of heart failure: signs/symptoms and  treatments.  Introduces Heart Failure Zone chart for action plan for heart failure.  Written material given at graduation.   Core Components/Risk Factors/Patient Goals Review:    Core Components/Risk Factors/Patient Goals at Discharge (Final Review):    ITP Comments:  ITP Comments     Row Name 05/15/22 1050 05/23/22 1413 05/24/22 1009 05/31/22 0800     ITP Comments Virtual Visit completed. Patient informed on EP and RD appointment and 6 Minute walk test. Patient also informed of patient health questionnaires on My Chart. Patient Verbalizes understanding. Visit diagnosis can be found in [facility] 03/13/22. Completed 6MWT and gym orientation. Initial ITP created and sent for review to Dr. Ottie Glazier, Medical Director. First full day of exercise!  Patient was oriented to gym and equipment including functions, settings, policies, and procedures.  Patient's individual exercise prescription and treatment plan were reviewed.  All starting workloads were established based on the results of the 6 minute walk test done at initial orientation visit.  The plan for exercise progression was also introduced and progression will be customized based on patient's performance and goals. 30 Day review completed. Medical Director ITP review done, changes made  as directed, and signed approval by Market researcher.    New to program             Comments:

## 2022-05-31 NOTE — Progress Notes (Signed)
Daily Session Note  Patient Details  Name: Amber Holder MRN: [id number] Date of Birth: 1946-01-03 Referring Provider:   Flowsheet Row Pulmonary Rehab from 05/23/2022 in [facility] Cardiac and Pulmonary Rehab  Referring Provider Clayborn Bigness MD       Encounter Date: 05/31/2022  Check In:  Session Check In - 05/31/22 1008       Check-In   Supervising physician immediately available to respond to emergencies See telemetry face sheet for immediately available ER MD    Location [facility]-Cardiac & Pulmonary Rehab    Staff Present Antionette Fairy, BS, Exercise Physiologist;Joseph Rosebud Poles, RN, [place]    Virtual Visit No    Medication changes reported     No    Fall or balance concerns reported    No    Warm-up and Cool-down Performed on first and last piece of equipment    Resistance Training Performed Yes    VAD Patient? No    PAD/SET Patient? No      Pain Assessment   Currently in Pain? No/denies                Social History   Tobacco Use  Smoking Status Former   Packs/day: 1.00   Years: 50.00   Total pack years: 50.00   Types: Cigarettes   Quit date: 12/28/2021   Years since quitting: 0.4   Passive exposure: Never  Smokeless Tobacco Never  Tobacco Comments   1 pack daily//quit 12/28/2021    Goals Met:  Independence with exercise equipment Exercise tolerated well No report of concerns or symptoms today Strength training completed today  Goals Unmet:  Not Applicable  Comments: Pt able to follow exercise prescription today without complaint.  Will continue to monitor for progression.    Dr. Emily Filbert is Medical Director for [facility].  Dr. Ottie Glazier is Medical Director for [facility] Pulmonary Rehabilitation.

## 2022-06-02 ENCOUNTER — Encounter: Payer: Medicare Other | Admitting: *Deleted

## 2022-06-02 DIAGNOSIS — R0609 Other forms of dyspnea: Secondary | ICD-10-CM | POA: Diagnosis not present

## 2022-06-02 DIAGNOSIS — J449 Chronic obstructive pulmonary disease, unspecified: Secondary | ICD-10-CM

## 2022-06-02 NOTE — Progress Notes (Signed)
Daily Session Note  Patient Details  Name: Syriana Croslin MRN: [id number] Date of Birth: [birth year] Referring Provider:   Flowsheet Row Pulmonary Rehab from 05/23/2022 in [facility] Cardiac and Pulmonary Rehab  Referring Provider Clayborn Bigness MD       Encounter Date: 06/02/2022  Check In:  Session Check In - 06/02/22 1042       Check-In   Supervising physician immediately available to respond to emergencies See telemetry face sheet for immediately available ER MD    Location [facility]-Cardiac & Pulmonary Rehab    Staff Present Heath Lark, RN, BSN, CCRP;Jessica [place], MA, RCEP, CCRP, CCET;Joseph Leland, [place]    Virtual Visit No    Medication changes reported     No    Fall or balance concerns reported    No    Warm-up and Cool-down Performed on first and last piece of equipment    Resistance Training Performed Yes    VAD Patient? No    PAD/SET Patient? No      Pain Assessment   Currently in Pain? No/denies                Social History   Tobacco Use  Smoking Status Former   Packs/day: 1.00   Years: 50.00   Total pack years: 50.00   Types: Cigarettes   Quit date: 12/28/2021   Years since quitting: 0.4   Passive exposure: Never  Smokeless Tobacco Never  Tobacco Comments   1 pack daily//quit 12/28/2021    Goals Met:  Proper associated with RPD/PD & O2 Sat Independence with exercise equipment Exercise tolerated well No report of concerns or symptoms today  Goals Unmet:  Not Applicable  Comments: Pt able to follow exercise prescription today without complaint.  Will continue to monitor for progression.    Dr. Emily Filbert is Medical Director for [place].  Dr. Ottie Glazier is Medical Director for [facility] Pulmonary Rehabilitation.

## 2022-06-05 ENCOUNTER — Encounter: Payer: Medicare Other | Admitting: *Deleted

## 2022-06-05 DIAGNOSIS — J449 Chronic obstructive pulmonary disease, unspecified: Secondary | ICD-10-CM | POA: Diagnosis not present

## 2022-06-05 DIAGNOSIS — R0609 Other forms of dyspnea: Secondary | ICD-10-CM | POA: Diagnosis not present

## 2022-06-05 NOTE — Progress Notes (Signed)
Daily Session Note  Patient Details  Name: Amber Holder MRN: [id number] Date of Birth: 1945/11/24 Referring Provider:   Flowsheet Row Pulmonary Rehab from 05/23/2022 in [facility] Cardiac and Pulmonary Rehab  Referring Provider Clayborn Bigness MD       Encounter Date: 06/05/2022  Check In:  Session Check In - 06/05/22 1354       Check-In   Supervising physician immediately available to respond to emergencies See telemetry face sheet for immediately available ER MD    Location [facility]-Cardiac & Pulmonary Rehab    Staff Present Renita Papa, RN Odelia Gage, RN, Terie Purser, RCP,RRT,BSRT    Virtual Visit No    Medication changes reported     No    Fall or balance concerns reported    No    Tobacco Cessation No Change    Warm-up and Cool-down Performed on first and last piece of equipment    Resistance Training Performed Yes    VAD Patient? No    PAD/SET Patient? No      Pain Assessment   Currently in Pain? No/denies                Social History   Tobacco Use  Smoking Status Former   Packs/day: 1.00   Years: 50.00   Total pack years: 50.00   Types: Cigarettes   Quit date: 12/28/2021   Years since quitting: 0.4   Passive exposure: Never  Smokeless Tobacco Never  Tobacco Comments   1 pack daily//quit 12/28/2021    Goals Met:  Independence with exercise equipment Exercise tolerated well No report of concerns or symptoms today Strength training completed today  Goals Unmet:  Not Applicable  Comments: Pt able to follow exercise prescription today without complaint.  Will continue to monitor for progression.    Dr. Emily Filbert is Medical Director for [facility].  Dr. Ottie Glazier is Medical Director for [facility] Pulmonary Rehabilitation.

## 2022-06-05 NOTE — Progress Notes (Signed)
Completed Initial RD Consulation

## 2022-06-07 ENCOUNTER — Encounter: Payer: Medicare Other | Admitting: *Deleted

## 2022-06-07 DIAGNOSIS — J449 Chronic obstructive pulmonary disease, unspecified: Secondary | ICD-10-CM

## 2022-06-07 DIAGNOSIS — R0609 Other forms of dyspnea: Secondary | ICD-10-CM | POA: Diagnosis not present

## 2022-06-07 NOTE — Progress Notes (Signed)
Daily Session Note  Patient Details  Name: Amber Holder MRN: [id number] Date of Birth: 06-01-1946 Referring Provider:   Flowsheet Row Pulmonary Rehab from 05/23/2022 in [facility] Cardiac and Pulmonary Rehab  Referring Provider Clayborn Bigness MD       Encounter Date: 06/07/2022  Check In:  Session Check In - 06/07/22 1003       Check-In   Supervising physician immediately available to respond to emergencies See telemetry face sheet for immediately available ER MD    Location [facility]-Cardiac & Pulmonary Rehab    Staff Present Antionette Fairy, BS, Exercise Physiologist;Jahmire Ruffins Tamala Julian, RN, Terie Purser, RCP,RRT,BSRT    Virtual Visit No    Medication changes reported     No    Fall or balance concerns reported    No    Warm-up and Cool-down Performed on first and last piece of equipment    Resistance Training Performed Yes    VAD Patient? No    PAD/SET Patient? No      Pain Assessment   Currently in Pain? No/denies                Social History   Tobacco Use  Smoking Status Former   Packs/day: 1.00   Years: 50.00   Total pack years: 50.00   Types: Cigarettes   Quit date: 12/28/2021   Years since quitting: 0.4   Passive exposure: Never  Smokeless Tobacco Never  Tobacco Comments   1 pack daily//quit 12/28/2021    Goals Met:  Independence with exercise equipment Exercise tolerated well No report of concerns or symptoms today Strength training completed today  Goals Unmet:  Not Applicable  Comments: Pt able to follow exercise prescription today without complaint.  Will continue to monitor for progression.    Dr. Emily Filbert is Medical Director for [facility].  Dr. Ottie Glazier is Medical Director for [facility] Pulmonary Rehabilitation.

## 2022-06-08 ENCOUNTER — Encounter: Payer: Self-pay | Admitting: Internal Medicine

## 2022-06-08 ENCOUNTER — Ambulatory Visit (INDEPENDENT_AMBULATORY_CARE_PROVIDER_SITE_OTHER): Payer: Medicare Other | Admitting: Internal Medicine

## 2022-06-08 VITALS — BP 139/72 | HR 78 | Temp 98.2°F | Resp 16 | Ht 65.0 in | Wt 187.2 lb

## 2022-06-08 DIAGNOSIS — I5032 Chronic diastolic (congestive) heart failure: Secondary | ICD-10-CM | POA: Diagnosis not present

## 2022-06-08 DIAGNOSIS — J9611 Chronic respiratory failure with hypoxia: Secondary | ICD-10-CM

## 2022-06-08 DIAGNOSIS — E1165 Type 2 diabetes mellitus with hyperglycemia: Secondary | ICD-10-CM | POA: Diagnosis not present

## 2022-06-08 DIAGNOSIS — F411 Generalized anxiety disorder: Secondary | ICD-10-CM | POA: Diagnosis not present

## 2022-06-08 NOTE — Progress Notes (Signed)
[facility] [place], [place] [address]  Internal MEDICINE  Office Visit Note  Patient Name: Amber Holder  [id number]  [id number]  Date of Service: 06/12/2022  Chief Complaint  Patient presents with   Follow-up   Diabetes   Gastroesophageal Reflux   COPD   Asthma   Medication Management    Patient has questions about her Wellbutrin    HPI Pt is here for routine follow up: She denies any major complaints, going for pulmonary rehab, excited about it, uses her portable o2  She stopped taking Wellbutrin because she was told by pharmacist that it might cause seizure disorder. Metformin is discontinued because her blood sugar levels are fine Cardiac MR is suggested as she is allergic to contrast dye    Current Medication: Outpatient Encounter Medications as of 06/08/2022  Medication Sig   acetaminophen (TYLENOL) 500 MG chewable tablet Chew 1,000 mg by mouth every 8 (eight) hours as needed for pain.   albuterol (VENTOLIN HFA) 108 (90 Base) MCG/ACT inhaler Inhale 2 puffs into the lungs every 6 (six) hours as needed for wheezing or shortness of breath.   ALPRAZolam (XANAX) 0.25 MG tablet Take one tab po bid for anxiety prn   aspirin EC 81 MG tablet Take 1 tablet (81 mg total) by mouth daily. Swallow whole.   atorvastatin (LIPITOR) 20 MG tablet Take 1 tablet (20 mg total) by mouth daily.   atorvastatin (LIPITOR) 40 MG tablet Take 40 mg by mouth daily. (Patient not taking: Reported on 06/09/2022)   cholecalciferol (VITAMIN D) 400 UNITS TABS tablet Take 2,000 Units by mouth daily.   CRANBERRY PO Take 25,000 mg by mouth daily.   dapagliflozin propanediol (FARXIGA) 10 MG TABS tablet Take 1 tablet (10 mg total) by mouth daily before breakfast.   diphenhydrAMINE (BENADRYL) 50 MG tablet Take one tab 13 hrs, 7 hrs and 1 hr before procedure along with prednisone, may take one extra if experienced itching or hives   ergocalciferol (DRISDOL) 1.25 MG (50000 UT) capsule  Take one cap q week   fluconazole (DIFLUCAN) 150 MG tablet Take one tab po q week for fungal infection   fluticasone (FLOVENT HFA) 110 MCG/ACT inhaler Inhale 2 puffs into the lungs 2 (two) times daily as needed.   glucose blood (ONETOUCH VERIO) test strip BLOOD SUGAR TESTING THREE TIMES DAILY . DX E11.65   hydrocortisone cream 0.5 % Apply topically as needed.    hydrocortisone cream 0.5 % Apply topically.   ibandronate (BONIVA) 150 MG tablet Take 1 tablet (150 mg total) by mouth every 30 (thirty) days.   Lancets (ONETOUCH DELICA PLUS [id number]) MISC Use  as directed twice a daily DX E11.65   Magnesium 250 MG TABS Take by mouth. Takes 1 tablet 2-3 times per week   metolazone (ZAROXOLYN) 5 MG tablet Take 0.5 tablets (2.5 mg total) by mouth daily. Take 30 mins prior to your Torsemide.   montelukast (SINGULAIR) 10 MG tablet Take 10 mg by mouth daily as needed.   Multiple Vitamins-Minerals (MULTIVITAMIN ADULTS PO) Take 1 tablet by mouth daily.   omeprazole (PRILOSEC) 40 MG capsule Take 1 capsule (40 mg total) by mouth daily.   OXYGEN Inhale 3 L into the lungs. Pt uses [facility] for Oxygen   potassium chloride SA (KLOR-CON M) 20 MEQ tablet TAKE 3 TABS (60 MEQ) IN AM AND 2 TABS (40 MEQ) IN THE PM.   predniSONE & diphenhydrAMINE (CONTRAST ALLERGY PREMED PACK) 3 x 50 MG &  1 x 50 MG KIT Take 50 mg by mouth Once PRN for up to 1 dose (TAKE 13 hour prior to CT angio). (Patient not taking: Reported on 06/09/2022)   predniSONE (DELTASONE) 50 MG tablet Take one tab 13 hrs, 7 hrs and 1 hr before scheduled time along with Benadryl, may take one extra after the procedure if experienced itching and hives (Patient not taking: Reported on 06/09/2022)   Simethicone 80 MG TABS Take 1 tablet (80 mg total) by mouth as needed (burping). As directed.   Tiotropium Bromide-Olodaterol 2.5-2.5 MCG/ACT AERS Inhale 2 puffs into the lungs daily.   torsemide (DEMADEX) 20 MG tablet Take 1 tablet (20 mg total) by mouth daily.    [DISCONTINUED] buPROPion (WELLBUTRIN XL) 150 MG 24 hr tablet TAKE 1 TABLET BY MOUTH EVERY DAY   [DISCONTINUED] metFORMIN (GLUCOPHAGE) 500 MG tablet Take 500 mg by mouth daily. with dinner   [DISCONTINUED] furosemide (LASIX) 20 MG tablet Take by mouth.   No facility-administered encounter medications on file as of 06/08/2022.    Surgical History: Past Surgical History:  Procedure Laterality Date   APPENDECTOMY     cataract surgery Bilateral    CHOLECYSTECTOMY     COLONOSCOPY     COLONOSCOPY WITH PROPOFOL N/A 05/28/2015   Procedure: COLONOSCOPY WITH PROPOFOL;  Surgeon: Lollie Sails, MD;  Location: [facility] ENDOSCOPY;  Service: Endoscopy;  Laterality: N/A;   COLONOSCOPY WITH PROPOFOL N/A 10/28/2018   Procedure: COLONOSCOPY WITH PROPOFOL;  Surgeon: Lollie Sails, MD;  Location: [facility] ENDOSCOPY;  Service: Endoscopy;  Laterality: N/A;   EYE SURGERY Bilateral 2015   Cataract Extraction with IOL   LITHOTRIPSY  2015   has had many stones   Mastoidotomy  1970   ROTATOR CUFF REPAIR Right 2011   TOTAL HIP ARTHROPLASTY Right 06/14/2016   Procedure: TOTAL HIP ARTHROPLASTY;  Surgeon: Dereck Leep, MD;  Location: [facility] ORS;  Service: Orthopedics;  Laterality: Right;    Medical History: Past Medical History:  Diagnosis Date   Anxiety 01/02/2012   Arthritis    Asthma    Blockage of coronary artery of heart (HCC)    Chronic kidney disease    Kidney stones   Colon polyps    COPD (chronic obstructive pulmonary disease) (HCC)    Diabetes mellitus without complication (HCC)    GERD (gastroesophageal reflux disease)    HOH (hard of hearing)    Bilateral hearing aids   Kidney stone    Osteoporosis    Sleep apnea    Does not use C-PAP on a regular basis   Venous stasis     Family History: Family History  Problem Relation Age of Onset   Breast cancer Maternal Aunt 52   Bladder Cancer Neg Hx    Kidney cancer Neg Hx     Social History   Socioeconomic History   Marital status:  Divorced    Spouse name: Not on file   Number of children: Not on file   Years of education: Not on file   Highest education level: Not on file  Occupational History   Not on file  Tobacco Use   Smoking status: Former    Packs/day: 1.00    Years: 50.00    Total pack years: 50.00    Types: Cigarettes    Quit date: 12/28/2021    Years since quitting: 0.4    Passive exposure: Never   Smokeless tobacco: Never   Tobacco comments:    1 pack daily//quit 12/28/2021  Vaping Use   Vaping Use: Never used  Substance and Sexual Activity   Alcohol use: Not Currently    Comment: very rarely    Drug use: No   Sexual activity: Not Currently  Other Topics Concern   Not on file  Social History Narrative   Not on file   Social Determinants of Health   Financial Resource Strain: Not on file  Food Insecurity: Not on file  Transportation Needs: Not on file  Physical Activity: Not on file  Stress: Not on file  Social Connections: Not on file  Intimate Partner Violence: Not on file      Review of Systems  Constitutional: Negative.  Negative for chills, fatigue and unexpected weight change.  HENT: Negative.  Negative for congestion, rhinorrhea, sneezing and sore throat.   Eyes: Negative.  Negative for redness.  Respiratory: Negative.  Negative for cough, chest tightness and shortness of breath.   Cardiovascular: Negative.  Negative for chest pain and palpitations.  Gastrointestinal: Negative.  Negative for abdominal pain, constipation, diarrhea, nausea and vomiting.  Endocrine: Negative.   Genitourinary:  Negative for dysuria and frequency.  Musculoskeletal: Negative.  Negative for arthralgias, back pain, joint swelling and neck pain.  Skin: Negative.  Negative for rash.  Allergic/Immunologic: Negative.   Neurological: Negative.  Negative for tremors and numbness.  Hematological: Negative.  Negative for adenopathy. Does not bruise/bleed easily.  Psychiatric/Behavioral: Negative.   Negative for behavioral problems (Depression), sleep disturbance and suicidal ideas. The patient is not nervous/anxious.   Review of systems Constitutional:  Negative for chills, fatigue and unexpected weight change.  HENT:  Negative for congestion, postnasal drip, rhinorrhea, sneezing and sore throat.   Eyes:  Negative for redness.  Respiratory:  Negative for cough, chest tightness and shortness of breath.   Cardiovascular:  Positive for leg swelling. Negative for chest pain and palpitations.  Gastrointestinal:  Negative for abdominal pain, constipation, diarrhea, nausea and vomiting.  Genitourinary:  Negative for dysuria and frequency.  Musculoskeletal:  Negative for arthralgias, back pain, joint swelling and neck pain.  Skin:  Negative for rash.  Neurological: Negative.  Negative for tremors and numbness.  Hematological:  Negative for adenopathy. Does not bruise/bleed easily.  Psychiatric/Behavioral:  Negative for behavioral problems (Depression), sleep disturbance and suicidal ideas. The patient is not nervous/anxious.    Physical Exam Constitutional:      Appearance: Normal appearance.  HENT:     Head: Normocephalic and atraumatic.     Nose: Nose normal.     Mouth/Throat:     Mouth: Mucous membranes are moist.     Pharynx: No posterior oropharyngeal erythema.  Eyes:     Extraocular Movements: Extraocular movements intact.     Pupils: Pupils are equal, round, and reactive to light.  Cardiovascular:     Pulses: Normal pulses.     Heart sounds: Normal heart sounds.  Pulmonary:     Effort: Pulmonary effort is normal.     Breath sounds: Normal breath sounds.  Musculoskeletal:     Right lower leg: Edema present.     Left lower leg: Edema present.  Neurological:     General: No focal deficit present.     Mental Status: She is alert.  Psychiatric:        Mood and Affect: Mood normal.        Behavior: Behavior normal.          Vital Signs: BP 139/72   Pulse 78   Temp  98.2 F (36.8  C)   Resp 16   Ht 5' 5"  (1.651 m)   Wt 187 lb 3.2 oz (84.9 kg)   SpO2 95%   BMI 31.15 kg/m         Assessment/Plan: 1. Type 2 diabetes mellitus with hyperglycemia, without long-term current use of insulin (HCC) Controlled with [facility], will hold metfromin for now, blood sugar levels are controlled within limits, as hg a1c is 6.2 on 04/24/2022  2. Chronic respiratory failure with hypoxia ([place]) She is a known pt of COPD and is doing fine with oxygen since her last visit.As is allergic to contrast dye so cardiac MR is being done to look for pulmonic stenosis.ordered by cardiology, pt is also doing well woth cardiopulmonary rehab   3. Chronic diastolic congestive heart failure (HCC) Encouraged to continue to  wear compression stockings as directed by vascular, will continue on Demadex 20 mg Monday Wednesday Friday along with Zaroxolyn 2.5 mg Monday Wednesday Friday, we will recheck her potassium level as well since patient is very sensitive to diuretics and has been taking high-dose potassium.We will continue oxygen as she is doing fine with it  4. GAD (generalized anxiety disorder) She was taking bupropion for nicotine addiction, however she became nervous and stopped it due to risk of SZ diorder as told by pharmacist. She is confident that she will remain smoke free   General Counseling: Atonya verbalizes understanding of the findings of todays visit and agrees with plan of treatment. I have discussed any further diagnostic evaluation that may be needed or ordered today. We also reviewed her medications today. she has been encouraged to call the office with any questions or concerns that should arise related to todays visit.    Orders Placed This Encounter  Procedures   Basic Metabolic Panel (BMET)    No orders of the defined types were placed in this encounter.   Total time spent:35 Minutes Time spent includes review of chart, medications, test results, and follow  up plan with the patient.   [place] Controlled Substance Database was reviewed by me.   Dr Lavera Guise Internal medicine

## 2022-06-09 ENCOUNTER — Encounter: Payer: Medicare Other | Admitting: *Deleted

## 2022-06-09 ENCOUNTER — Ambulatory Visit (INDEPENDENT_AMBULATORY_CARE_PROVIDER_SITE_OTHER): Payer: Medicare Other | Admitting: Vascular Surgery

## 2022-06-09 ENCOUNTER — Encounter (INDEPENDENT_AMBULATORY_CARE_PROVIDER_SITE_OTHER): Payer: Self-pay | Admitting: Vascular Surgery

## 2022-06-09 VITALS — BP 111/68 | HR 73 | Resp 16 | Wt 189.8 lb

## 2022-06-09 DIAGNOSIS — I83893 Varicose veins of bilateral lower extremities with other complications: Secondary | ICD-10-CM | POA: Diagnosis not present

## 2022-06-09 DIAGNOSIS — E782 Mixed hyperlipidemia: Secondary | ICD-10-CM | POA: Diagnosis not present

## 2022-06-09 DIAGNOSIS — J449 Chronic obstructive pulmonary disease, unspecified: Secondary | ICD-10-CM

## 2022-06-09 DIAGNOSIS — E1165 Type 2 diabetes mellitus with hyperglycemia: Secondary | ICD-10-CM

## 2022-06-09 DIAGNOSIS — I6523 Occlusion and stenosis of bilateral carotid arteries: Secondary | ICD-10-CM | POA: Diagnosis not present

## 2022-06-09 DIAGNOSIS — R0609 Other forms of dyspnea: Secondary | ICD-10-CM | POA: Diagnosis not present

## 2022-06-09 NOTE — Assessment & Plan Note (Signed)
Venous reflux study shows bilateral great saphenous vein reflux previously.  She has been diligently wearing her compression socks and elevating her legs but does still have persistent swelling.  Her swelling is multifactorial with her cardiopulmonary issues and her venous insufficiency, and we discussed that if we do laser ablation is not going to eliminate the swelling but I do think it will help.  She voices her understanding and desires to think about her options before she decides for sure.  She also has a cardiac evaluation scheduled for next week.  Going to contact our office.

## 2022-06-09 NOTE — Assessment & Plan Note (Signed)
blood glucose control important in reducing the progression of atherosclerotic disease. Also, involved in wound healing. On appropriate medications.  

## 2022-06-09 NOTE — Progress Notes (Signed)
Daily Session Note  Patient Details  Name: Amber Holder MRN: [id number] Date of Birth: [birth year] Referring Provider:   Flowsheet Row Pulmonary Rehab from 05/23/2022 in [facility]  Referring Provider Clayborn Bigness MD       Encounter Date: 06/09/2022  Check In:      Social History   Tobacco Use  Smoking Status Former   Packs/day: 1.00   Years: 50.00   Total pack years: 50.00   Types: Cigarettes   Quit date: 12/28/2021   Years since quitting: 0.4   Passive exposure: Never  Smokeless Tobacco Never  Tobacco Comments   1 pack daily//quit 12/28/2021    Goals Met:  Independence with exercise equipment Exercise tolerated well No report of concerns or symptoms today  Goals Unmet:  Not Applicable  Comments: Pt able to follow exercise prescription today without complaint.  Will continue to monitor for progression.    Dr. Emily Filbert is Medical Director for [facility].  Dr. Ottie Glazier is Medical Director for [facility].

## 2022-06-09 NOTE — Assessment & Plan Note (Signed)
lipid control important in reducing the progression of atherosclerotic disease. Continue statin therapy  

## 2022-06-09 NOTE — Progress Notes (Signed)
MRN : [id number]  Amber Holder is a 76 y.o. (Jun 12, 1946) female who presents with chief complaint of  Chief Complaint  Patient presents with   Follow-up    3 month follow up  .  History of Present Illness: Patient returns today in follow up of leg swelling and venous insufficiency.  She has been previously demonstrated to have great saphenous vein reflux bilaterally.  She has been diligently wearing 20 to 30 mmHg pressure and socks now for over 3 months.  She has been elevating her legs.  Her swelling is persistent.  It may be slightly better, but is still significant.  She also has significant cardiopulmonary disease and has a cardiac evaluation next week.  No new ulceration or infection.  No fevers or chills.  Current Outpatient Medications  Medication Sig Dispense Refill   acetaminophen (TYLENOL) 500 MG chewable tablet Chew 1,000 mg by mouth every 8 (eight) hours as needed for pain.     albuterol (VENTOLIN HFA) 108 (90 Base) MCG/ACT inhaler Inhale 2 puffs into the lungs every 6 (six) hours as needed for wheezing or shortness of breath. 18 g 2   ALPRAZolam (XANAX) 0.25 MG tablet Take one tab po bid for anxiety prn 30 tablet 1   aspirin EC 81 MG tablet Take 1 tablet (81 mg total) by mouth daily. Swallow whole. 90 tablet 3   atorvastatin (LIPITOR) 20 MG tablet Take 1 tablet (20 mg total) by mouth daily. 90 tablet 3   cholecalciferol (VITAMIN D) 400 UNITS TABS tablet Take 2,000 Units by mouth daily.     CRANBERRY PO Take 25,000 mg by mouth daily.     dapagliflozin propanediol (FARXIGA) 10 MG TABS tablet Take 1 tablet (10 mg total) by mouth daily before breakfast. 90 tablet 3   diphenhydrAMINE (BENADRYL) 50 MG tablet Take one tab 13 hrs, 7 hrs and 1 hr before procedure along with prednisone, may take one extra if experienced itching or hives 4 tablet 0   ergocalciferol (DRISDOL) 1.25 MG (50000 UT) capsule Take one cap q week 12 capsule 3   fluconazole (DIFLUCAN) 150 MG tablet Take one tab  po q week for fungal infection 4 tablet 0   fluticasone (FLOVENT HFA) 110 MCG/ACT inhaler Inhale 2 puffs into the lungs 2 (two) times daily as needed.     glucose blood (ONETOUCH VERIO) test strip BLOOD SUGAR TESTING THREE TIMES DAILY . DX E11.65 100 strip 3   hydrocortisone cream 0.5 % Apply topically as needed.      hydrocortisone cream 0.5 % Apply topically.     ibandronate (BONIVA) 150 MG tablet Take 1 tablet (150 mg total) by mouth every 30 (thirty) days. 3 tablet 3   Lancets (ONETOUCH DELICA PLUS [id number]) MISC Use  as directed twice a daily DX E11.65 100 each 3   Magnesium 250 MG TABS Take by mouth. Takes 1 tablet 2-3 times per week     metolazone (ZAROXOLYN) 5 MG tablet Take 0.5 tablets (2.5 mg total) by mouth daily. Take 30 mins prior to your Torsemide. 30 tablet 1   montelukast (SINGULAIR) 10 MG tablet Take 10 mg by mouth daily as needed.     Multiple Vitamins-Minerals (MULTIVITAMIN ADULTS PO) Take 1 tablet by mouth daily.     omeprazole (PRILOSEC) 40 MG capsule Take 1 capsule (40 mg total) by mouth daily.     OXYGEN Inhale 3 L into the lungs. Pt uses [facility] for Oxygen     potassium  chloride SA (KLOR-CON M) 20 MEQ tablet TAKE 3 TABS (60 MEQ) IN AM AND 2 TABS (40 MEQ) IN THE PM. 300 tablet 2   Simethicone 80 MG TABS Take 1 tablet (80 mg total) by mouth as needed (burping). As directed.  0   Tiotropium Bromide-Olodaterol 2.5-2.5 MCG/ACT AERS Inhale 2 puffs into the lungs daily. 1 each 4   torsemide (DEMADEX) 20 MG tablet Take 1 tablet (20 mg total) by mouth daily. 30 tablet 1   atorvastatin (LIPITOR) 40 MG tablet Take 40 mg by mouth daily. (Patient not taking: Reported on 06/09/2022)     predniSONE & diphenhydrAMINE (CONTRAST ALLERGY PREMED PACK) 3 x 50 MG & 1 x 50 MG KIT Take 50 mg by mouth Once PRN for up to 1 dose (TAKE 13 hour prior to CT angio). (Patient not taking: Reported on 06/09/2022) 1 kit 0   predniSONE (DELTASONE) 50 MG tablet Take one tab 13 hrs, 7 hrs and 1 hr  before scheduled time along with Benadryl, may take one extra after the procedure if experienced itching and hives (Patient not taking: Reported on 06/09/2022) 4 tablet 0   No current facility-administered medications for this visit.    Past Medical History:  Diagnosis Date   Anxiety 01/02/2012   Arthritis    Asthma    Blockage of coronary artery of heart (HCC)    Chronic kidney disease    Kidney stones   Colon polyps    COPD (chronic obstructive pulmonary disease) (HCC)    Diabetes mellitus without complication (HCC)    GERD (gastroesophageal reflux disease)    HOH (hard of hearing)    Bilateral hearing aids   Kidney stone    Osteoporosis    Sleep apnea    Does not use C-PAP on a regular basis   Venous stasis     Past Surgical History:  Procedure Laterality Date   APPENDECTOMY     cataract surgery Bilateral    CHOLECYSTECTOMY     COLONOSCOPY     COLONOSCOPY WITH PROPOFOL N/A 05/28/2015   Procedure: COLONOSCOPY WITH PROPOFOL;  Surgeon: Lollie Sails, MD;  Location: [facility] ENDOSCOPY;  Service: Endoscopy;  Laterality: N/A;   COLONOSCOPY WITH PROPOFOL N/A 10/28/2018   Procedure: COLONOSCOPY WITH PROPOFOL;  Surgeon: Lollie Sails, MD;  Location: [facility] ENDOSCOPY;  Service: Endoscopy;  Laterality: N/A;   EYE SURGERY Bilateral 2015   Cataract Extraction with IOL   LITHOTRIPSY  2015   has had many stones   Mastoidotomy  1970   ROTATOR CUFF REPAIR Right 2011   TOTAL HIP ARTHROPLASTY Right 06/14/2016   Procedure: TOTAL HIP ARTHROPLASTY;  Surgeon: Dereck Leep, MD;  Location: [facility] ORS;  Service: Orthopedics;  Laterality: Right;     Social History   Tobacco Use   Smoking status: Former    Packs/day: 1.00    Years: 50.00    Total pack years: 50.00    Types: Cigarettes    Quit date: 12/28/2021    Years since quitting: 0.4    Passive exposure: Never   Smokeless tobacco: Never   Tobacco comments:    1 pack daily//quit 12/28/2021  Vaping Use   Vaping Use: Never used   Substance Use Topics   Alcohol use: Not Currently    Comment: very rarely    Drug use: No      Family History  Problem Relation Age of Onset   Breast cancer Maternal Aunt 25   Bladder Cancer Neg Hx  Kidney cancer Neg Hx      Allergies  Allergen Reactions   Iodinated Contrast Media Hives and Itching    Pt received 13hr pre-meds for a Cardiac scan on 11/16/21 and had itching and Hives develop after contrast administration   Shellfish Allergy Swelling    "shrimp" Betadine okay   Sulfa Antibiotics Hives    Difficulty breathing   Sulfasalazine Hives    Difficulty breathing   Percocet [Oxycodone-Acetaminophen] Hives    Difficulty breathing   Iodine Itching     REVIEW OF SYSTEMS (Negative unless checked)  Constitutional: _0 Weight loss  _1 Fever  _2 Chills Cardiac: _3 Chest pain   _4 Chest pressure   _5 Palpitations   _6 Shortness of breath when laying flat   _7 Shortness of breath at rest   _8 Shortness of breath with exertion. Vascular:  _9 Pain in legs with walking   _10 Pain in legs at rest   _11 Pain in legs when laying flat   _12 Claudication   _13 Pain in feet when walking  _14 Pain in feet at rest  _15 Pain in feet when laying flat   _16 History of DVT   _17 Phlebitis   _18 Swelling in legs   _19 Varicose veins   _20 Non-healing ulcers Pulmonary:   _21 Uses home oxygen   _22 Productive cough   _23 Hemoptysis   _24 Wheeze  _25 COPD   _26 Asthma Neurologic:  _27 Dizziness  _28 Blackouts   _29 Seizures   _30 History of stroke   _31 History of TIA  _32 Aphasia   _33 Temporary blindness   _34 Dysphagia   _35 Weakness or numbness in arms   _36 Weakness or numbness in legs Musculoskeletal:  _37 Arthritis   _38 Joint swelling   _39 Joint pain   _40 Low back pain Hematologic:  _41 Easy bruising  _42 Easy bleeding   _43 Hypercoagulable state   _44 Anemic   Gastrointestinal:  _45 Blood in stool   _46 Vomiting blood  _47 Gastroesophageal reflux/heartburn   _48 Abdominal pain Genitourinary:  _49 Chronic kidney disease   _50 Difficult urination  _51 Frequent urination   _52 Burning with urination   _53 Hematuria Skin:  _54 Rashes   _55 Ulcers   _56 Wounds Psychological:  _57 History of anxiety   _58  History of major depression.  Physical Examination  BP 111/68 (BP Location: Right Arm)   Pulse 73   Resp 16   Wt 189 lb 12.8 oz (86.1 kg)   BMI 31.58 kg/m  Gen:  WD/WN, NAD Head: [place]/AT, No temporalis wasting. Ear/Nose/Throat: Hearing diminished nares w/o erythema or drainage Eyes: Conjunctiva clear. Sclera non-icteric Neck: Supple.  Trachea midline Pulmonary:  Good air movement, no use of accessory muscles.  Cardiac: irregular Vascular:  Vessel Right Left  Radial Palpable Palpable       Musculoskeletal: M/S 5/5 throughout.  No deformity or atrophy. 1+ BLE edema. Neurologic: Sensation grossly intact in extremities.  Symmetrical.  Speech is fluent.  Psychiatric: Judgment intact, Mood & affect appropriate for pt's clinical situation. Dermatologic: No rashes or ulcers noted.  No cellulitis or open wounds.      Labs Recent Results (from the past 2160 hour(s))  Basic metabolic panel     Status: Abnormal   Collection Time: 03/13/22  7:52 AM  Result Value Ref Range   Sodium 135 135 - 145 mmol/L   Potassium 3.3 (L) 3.5 - 5.1 mmol/L   Chloride 100 98 - 111 mmol/L   CO2 25 22 - 32 mmol/L   Glucose, Bld 151 (H) 70 - 99 mg/dL    Comment: Glucose reference range applies only to samples taken after fasting for at least 8 hours.   BUN 37 (H) 8 - 23 mg/dL   Creatinine,  Ser 1.08 (H) 0.44 - 1.00 mg/dL   Calcium 9.0 8.9 - 10.3 mg/dL   GFR, Estimated 53 (L) >60 mL/min    Comment: (NOTE) Calculated using the CKD-EPI Creatinine Equation (2021)    Anion gap 10 5 - 15    Comment: Performed at [facility], [address] [place], [place] [address]  Basic metabolic panel     Status: Abnormal   Collection Time: 03/21/22 12:28 PM  Result Value Ref Range   Sodium 139 135 - 145 mmol/L   Potassium 4.4 3.5 - 5.1 mmol/L   Chloride 107 98 - 111 mmol/L   CO2 23 22  - 32 mmol/L   Glucose, Bld 121 (H) 70 - 99 mg/dL    Comment: Glucose reference range applies only to samples taken after fasting for at least 8 hours.   BUN 24 (H) 8 - 23 mg/dL   Creatinine, Ser 0.85 0.44 - 1.00 mg/dL   Calcium 9.2 8.9 - 10.3 mg/dL   GFR, Estimated >60 >60 mL/min    Comment: (NOTE) Calculated using the CKD-EPI Creatinine Equation (2021)    Anion gap 9 5 - 15    Comment: Performed at [facility], [address] [place], [place] [address]  POCT Urinalysis Dipstick     Status: Abnormal   Collection Time: 03/27/22  9:03 AM  Result Value Ref Range   Color, UA     Clarity, UA     Glucose, UA Positive (A) Negative   Bilirubin, UA Negative    Ketones, UA Negative    Spec Grav, UA 1.010 1.010 - 1.025   Blood, UA Negative    pH, UA 6.0 5.0 - 8.0   Protein, UA Negative Negative   Urobilinogen, UA 0.2 0.2 or 1.0 E.U./dL   Nitrite, UA Negative    Leukocytes, UA Negative Negative   Appearance     Odor    Pulmonary Function Test     Status: None   Collection Time: 04/17/22  3:33 PM  Result Value Ref Range   FEV1     FVC     FEV1/FVC     TLC     DLCO    Basic metabolic panel     Status: Abnormal   Collection Time: 04/20/22 11:25 AM  Result Value Ref Range   Sodium 137 135 - 145 mmol/L   Potassium 3.1 (L) 3.5 - 5.1 mmol/L   Chloride 99 98 - 111 mmol/L   CO2 26 22 - 32 mmol/L   Glucose, Bld 132 (H) 70 - 99 mg/dL    Comment: Glucose reference range applies only to samples taken after fasting for at least 8 hours.   BUN 23 8 - 23 mg/dL   Creatinine, Ser 0.89 0.44 - 1.00 mg/dL   Calcium 9.8 8.9 - 10.3 mg/dL   GFR, Estimated >60 >60 mL/min    Comment: (NOTE) Calculated using the CKD-EPI Creatinine Equation (2021)    Anion gap 12 5 - 15    Comment: Performed at [facility], [address] [place], [place] [address]  POCT glycosylated hemoglobin (Hb A1C)     Status: Abnormal   Collection Time: 04/24/22 10:10 AM  Result Value Ref Range    Hemoglobin A1C 6.2 (A) 4.0 - 5.6 %   HbA1c POC (<> result, manual entry)     HbA1c, POC (prediabetic range)     HbA1c, POC (controlled diabetic range)    POCT Urinalysis Dipstick     Status:  Abnormal   Collection Time: 04/24/22 10:32 AM  Result Value Ref Range   Color, UA     Clarity, UA     Glucose, UA Positive (A) Negative   Bilirubin, UA neg    Ketones, UA trace    Spec Grav, UA 1.010 1.010 - 1.025   Blood, UA trace    pH, UA 6.5 5.0 - 8.0   Protein, UA Negative Negative   Urobilinogen, UA 0.2 0.2 or 1.0 E.U./dL   Nitrite, UA neg    Leukocytes, UA Negative Negative   Appearance     Odor    CULTURE, URINE COMPREHENSIVE     Status: None   Collection Time: 04/24/22 10:39 AM   Specimen: Urine   Urine  Result Value Ref Range   Urine Culture, Comprehensive Final report    Organism ID, Bacteria Comment     Comment: No growth in 36 - 48 hours.  Basic metabolic panel     Status: Abnormal   Collection Time: 05/02/22 10:05 AM  Result Value Ref Range   Sodium 138 135 - 145 mmol/L   Potassium 3.4 (L) 3.5 - 5.1 mmol/L   Chloride 99 98 - 111 mmol/L   CO2 27 22 - 32 mmol/L   Glucose, Bld 184 (H) 70 - 99 mg/dL    Comment: Glucose reference range applies only to samples taken after fasting for at least 8 hours.   BUN 40 (H) 8 - 23 mg/dL   Creatinine, Ser 1.08 (H) 0.44 - 1.00 mg/dL   Calcium 10.3 8.9 - 10.3 mg/dL   GFR, Estimated 53 (L) >60 mL/min    Comment: (NOTE) Calculated using the CKD-EPI Creatinine Equation (2021)    Anion gap 12 5 - 15    Comment: Performed at [facility], [address] [place], [place] [address]  UA/M w/rflx Culture, Routine     Status: Abnormal   Collection Time: 05/19/22 10:00 AM   Specimen: Urine   Urine  Result Value Ref Range   Specific Gravity, UA 1.018 1.005 - 1.030   pH, UA 6.5 5.0 - 7.5   Color, UA Yellow Yellow   Appearance Ur Clear Clear   Leukocytes,UA Negative Negative   Protein,UA Negative Negative/Trace   Glucose, UA 2+  (A) Negative   Ketones, UA Negative Negative   RBC, UA Negative Negative   Bilirubin, UA Negative Negative   Urobilinogen, Ur 0.2 0.2 - 1.0 mg/dL   Nitrite, UA Negative Negative   Microscopic Examination Comment     Comment: Microscopic follows if indicated.   Microscopic Examination See below:     Comment: Microscopic was indicated and was performed.   Urinalysis Reflex Comment     Comment: This specimen will not reflex to a Urine Culture.  Microscopic Examination     Status: None   Collection Time: 05/19/22 10:00 AM   Urine  Result Value Ref Range   WBC, UA 0-5 0 - 5 /hpf   RBC, Urine 0-2 0 - 2 /hpf   Epithelial Cells (non renal) 0-10 0 - 10 /hpf   Casts None seen None seen /lpf   Bacteria, UA None seen None seen/Few  CBC with Differential/Platelet     Status: None   Collection Time: 05/19/22 11:08 AM  Result Value Ref Range   WBC 8.8 4.0 - 10.5 K/uL   RBC 4.88 3.87 - 5.11 MIL/uL   Hemoglobin 14.4 12.0 - 15.0 g/dL   HCT 42.9 36.0 - 46.0 %  MCV 87.9 80.0 - 100.0 fL   MCH 29.5 26.0 - 34.0 pg   MCHC 33.6 30.0 - 36.0 g/dL   RDW 12.0 11.5 - 15.5 %   Platelets 237 150 - 400 K/uL   nRBC 0.0 0.0 - 0.2 %   Neutrophils Relative % 50 %   Neutro Abs 4.4 1.7 - 7.7 K/uL   Lymphocytes Relative 40 %   Lymphs Abs 3.6 0.7 - 4.0 K/uL   Monocytes Relative 8 %   Monocytes Absolute 0.7 0.1 - 1.0 K/uL   Eosinophils Relative 1 %   Eosinophils Absolute 0.1 0.0 - 0.5 K/uL   Basophils Relative 1 %   Basophils Absolute 0.1 0.0 - 0.1 K/uL   Immature Granulocytes 0 %   Abs Immature Granulocytes 0.02 0.00 - 0.07 K/uL    Comment: Performed at [facility], [address] [place], [place] [address]  Lipid panel     Status: None   Collection Time: 05/19/22 11:08 AM  Result Value Ref Range   Cholesterol 162 0 - 200 mg/dL   Triglycerides 111 <150 mg/dL   HDL 48 >40 mg/dL   Total CHOL/HDL Ratio 3.4 RATIO   VLDL 22 0 - 40 mg/dL   LDL Cholesterol 92 0 - 99 mg/dL    Comment:         Total Cholesterol/HDL:CHD Risk Coronary Heart Disease Risk Table                     Men   Women  1/2 Average Risk   3.4   3.3  Average Risk       5.0   4.4  2 X Average Risk   9.6   7.1  3 X Average Risk  23.4   11.0        Use the calculated Patient Ratio above and the CHD Risk Table to determine the patient's CHD Risk.        ATP III CLASSIFICATION (LDL):  <100     mg/dL   Optimal  100-129  mg/dL   Near or Above                    Optimal  130-159  mg/dL   Borderline  160-189  mg/dL   High  >190     mg/dL   Very High Performed at [facility], [address] [place], [place] [address]   TSH     Status: None   Collection Time: 05/19/22 11:08 AM  Result Value Ref Range   TSH 1.527 0.350 - 4.500 uIU/mL    Comment: Performed by a 3rd Generation assay with a functional sensitivity of <=0.01 uIU/mL. Performed at [facility], [address] [place], [place] [address]   T4, free     Status: None   Collection Time: 05/19/22 11:08 AM  Result Value Ref Range   Free T4 0.81 0.61 - 1.12 ng/dL    Comment: (NOTE) Biotin ingestion may interfere with free T4 tests. If the results are inconsistent with the TSH level, previous test results, or the clinical presentation, then consider biotin interference. If needed, order repeat testing after stopping biotin. Performed at [facility], [address] [place], [place] [address]   Comprehensive metabolic panel     Status: Abnormal   Collection Time: 05/19/22 11:08 AM  Result Value Ref Range   Sodium 139 135 - 145 mmol/L   Potassium 3.3 (L) 3.5 - 5.1 mmol/L  Chloride 99 98 - 111 mmol/L   CO2 27 22 - 32 mmol/L   Glucose, Bld 137 (H) 70 - 99 mg/dL    Comment: Glucose reference range applies only to samples taken after fasting for at least 8 hours.   BUN 27 (H) 8 - 23 mg/dL   Creatinine, Ser 0.77 0.44 - 1.00 mg/dL   Calcium 9.7 8.9 - 10.3 mg/dL   Total Protein 7.6 6.5 - 8.1 g/dL   Albumin 4.1 3.5 -  5.0 g/dL   AST 23 15 - 41 U/L   ALT 20 0 - 44 U/L   Alkaline Phosphatase 58 38 - 126 U/L   Total Bilirubin 0.9 0.3 - 1.2 mg/dL   GFR, Estimated >60 >60 mL/min    Comment: (NOTE) Calculated using the CKD-EPI Creatinine Equation (2021)    Anion gap 13 5 - 15    Comment: Performed at [facility], [address] [place], [place] [address]  Glucose, capillary     Status: Abnormal   Collection Time: 05/24/22  9:48 AM  Result Value Ref Range   Glucose-Capillary 161 (H) 70 - 99 mg/dL    Comment: Glucose reference range applies only to samples taken after fasting for at least 8 hours.  Glucose, capillary     Status: Abnormal   Collection Time: 05/29/22  9:44 AM  Result Value Ref Range   Glucose-Capillary 144 (H) 70 - 99 mg/dL    Comment: Glucose reference range applies only to samples taken after fasting for at least 8 hours.  Glucose, capillary     Status: Abnormal   Collection Time: 05/29/22 10:49 AM  Result Value Ref Range   Glucose-Capillary 115 (H) 70 - 99 mg/dL    Comment: Glucose reference range applies only to samples taken after fasting for at least 8 hours.  Glucose, capillary     Status: Abnormal   Collection Time: 05/31/22  9:47 AM  Result Value Ref Range   Glucose-Capillary 123 (H) 70 - 99 mg/dL    Comment: Glucose reference range applies only to samples taken after fasting for at least 8 hours.  Glucose, capillary     Status: Abnormal   Collection Time: 05/31/22 10:47 AM  Result Value Ref Range   Glucose-Capillary 111 (H) 70 - 99 mg/dL    Comment: Glucose reference range applies only to samples taken after fasting for at least 8 hours.    Radiology Abdomen 1 view (KUB)  Result Date: 05/25/2022 CLINICAL DATA:  Nephrolithiasis. EXAM: ABDOMEN - 1 VIEW COMPARISON:  Radiograph 05/25/2021 FINDINGS: Previous left renal stone is not definitively seen on the current exam. No definite visualized urolithiasis. Calcification to the right of L3 is chronic likely vascular  in etiology. Normal bowel gas pattern with moderate colonic stool. Cholecystectomy clips in the right upper quadrant. Bibasilar scarring in the lung bases. No acute osseous findings. Right hip arthroplasty. IMPRESSION: Previous left renal stone is not definitively seen on the current exam, unclear if this is due to technique or passage of stone. No definite visualized urolithiasis. Electronically Signed   By: Keith Rake M.D.   On: 05/25/2022 17:16    Assessment/Plan  Varicose veins of leg with swelling, bilateral Venous reflux study shows bilateral great saphenous vein reflux previously.  She has been diligently wearing her compression socks and elevating her legs but does still have persistent swelling.  Her swelling is multifactorial with her cardiopulmonary issues and her venous insufficiency, and we discussed that if we do laser  ablation is not going to eliminate the swelling but I do think it will help.  She voices her understanding and desires to think about her options before she decides for sure.  She also has a cardiac evaluation scheduled for next week.  Going to contact our office.  Uncontrolled type 2 diabetes mellitus with hyperglycemia (HCC) blood glucose control important in reducing the progression of atherosclerotic disease. Also, involved in wound healing. On appropriate medications.   Mixed hyperlipidemia lipid control important in reducing the progression of atherosclerotic disease. Continue statin therapy    Leotis Pain, MD  06/09/2022 12:07 PM    This note was created with Dragon medical transcription system.  Any errors from dictation are purely unintentional

## 2022-06-12 ENCOUNTER — Encounter: Payer: Medicare Other | Admitting: *Deleted

## 2022-06-12 ENCOUNTER — Other Ambulatory Visit
Admission: RE | Admit: 2022-06-12 | Discharge: 2022-06-12 | Disposition: A | Discharge status: Home / Self Care | Payer: Medicare Other | Source: Home / Self Care | Attending: Cardiology | Admitting: Cardiology

## 2022-06-12 ENCOUNTER — Telehealth (HOSPITAL_COMMUNITY): Payer: Self-pay | Admitting: *Deleted

## 2022-06-12 DIAGNOSIS — J449 Chronic obstructive pulmonary disease, unspecified: Secondary | ICD-10-CM

## 2022-06-12 DIAGNOSIS — R0609 Other forms of dyspnea: Secondary | ICD-10-CM | POA: Insufficient documentation

## 2022-06-12 LAB — BASIC METABOLIC PANEL
Anion gap: 12 (ref 5–15)
BUN: 21 mg/dL (ref 8–23)
CO2: 26 mmol/L (ref 22–32)
Calcium: 9.6 mg/dL (ref 8.9–10.3)
Chloride: 100 mmol/L (ref 98–111)
Creatinine, Ser: 0.82 mg/dL (ref 0.44–1.00)
GFR, Estimated: >60 mL/min (ref >60)
Glucose, Bld: 119 mg/dL — ABNORMAL HIGH (ref 70–99)
Potassium: 3.8 mmol/L (ref 3.5–5.1)
Sodium: 138 mmol/L (ref 135–145)

## 2022-06-12 LAB — HEMOGLOBIN AND HEMATOCRIT, BLOOD
HCT: 44.9 % (ref 36.0–46.0)
Hemoglobin: 14.9 g/dL (ref 12.0–15.0)

## 2022-06-12 NOTE — Telephone Encounter (Signed)
Reaching out to patient to offer assistance regarding upcoming cardiac imaging study; pt verbalizes understanding of appt date/time, parking situation and where to check in, and verified current allergies; name and call back number provided for further questions should they arise  Amber Clement RN Navigator Cardiac Imaging [facility] [phone] office [phone] cell  Patient denies claustrophobia but does report a hip replacement.

## 2022-06-12 NOTE — Progress Notes (Signed)
Daily Session Note  Patient Details  Name: Amber Holder MRN: [id number] Date of Birth: November 20, 1945 Referring Provider:   Flowsheet Row Pulmonary Rehab from 05/23/2022 in [facility]  Referring Provider Clayborn Bigness MD       Encounter Date: 06/12/2022  Check In:  Session Check In - 06/12/22 0952       Check-In   Supervising physician immediately available to respond to emergencies See telemetry face sheet for immediately available ER MD    Location [facility]-Cardiac & Pulmonary Rehab    Staff Present Antionette Fairy, BS, Exercise Physiologist;Kelly Amedeo Plenty, BS, ACSM CEP, Exercise Physiologist;Kentrell Hallahan Tamala Julian, RN, ADN    Virtual Visit No    Medication changes reported     No    Fall or balance concerns reported    No    Warm-up and Cool-down Performed on first and last piece of equipment    Resistance Training Performed Yes    VAD Patient? No    PAD/SET Patient? No      Pain Assessment   Currently in Pain? No/denies                Social History   Tobacco Use  Smoking Status Former   Packs/day: 1.00   Years: 50.00   Total pack years: 50.00   Types: Cigarettes   Quit date: 12/28/2021   Years since quitting: 0.4   Passive exposure: Never  Smokeless Tobacco Never  Tobacco Comments   1 pack daily//quit 12/28/2021    Goals Met:  Independence with exercise equipment Exercise tolerated well No report of concerns or symptoms today Strength training completed today  Goals Unmet:  Not Applicable  Comments: Pt able to follow exercise prescription today without complaint.  Will continue to monitor for progression.    Dr. Emily Filbert is Medical Director for [facility].  Dr. Ottie Glazier is Medical Director for [facility] Pulmonary Rehabilitation.

## 2022-06-14 ENCOUNTER — Other Ambulatory Visit: Payer: Self-pay | Admitting: Cardiology

## 2022-06-14 ENCOUNTER — Ambulatory Visit
Admission: RE | Admit: 2022-06-14 | Discharge: 2022-06-14 | Disposition: A | Discharge status: Home / Self Care | Payer: Medicare Other | Source: Ambulatory Visit | Attending: Cardiology | Admitting: Cardiology

## 2022-06-14 DIAGNOSIS — R0609 Other forms of dyspnea: Secondary | ICD-10-CM | POA: Diagnosis not present

## 2022-06-14 MED ORDER — GADOBUTROL 1 MMOL/ML IV SOLN
12.0000 mL | Freq: Once | INTRAVENOUS | Status: AC | PRN
  Administered 2022-06-14: 12 mL via INTRAVENOUS

## 2022-06-15 DIAGNOSIS — Z96641 Presence of right artificial hip joint: Secondary | ICD-10-CM | POA: Diagnosis not present

## 2022-06-16 ENCOUNTER — Encounter: Payer: Medicare Other | Admitting: *Deleted

## 2022-06-16 DIAGNOSIS — J449 Chronic obstructive pulmonary disease, unspecified: Secondary | ICD-10-CM

## 2022-06-16 DIAGNOSIS — R0609 Other forms of dyspnea: Secondary | ICD-10-CM | POA: Diagnosis not present

## 2022-06-16 NOTE — Progress Notes (Signed)
Daily Session Note  Patient Details  Name: Amber Holder MRN: [id number] Date of Birth: [birth year] Referring Provider:   Flowsheet Row Pulmonary Rehab from 05/23/2022 in [facility] Cardiac and Pulmonary Rehab  Referring Provider Clayborn Bigness MD       Encounter Date: 06/16/2022  Check In:  Session Check In - 06/16/22 1031       Check-In   Supervising physician immediately available to respond to emergencies See telemetry face sheet for immediately available ER MD    Location [facility]-Cardiac & Pulmonary Rehab    Staff Present Heath Lark, RN, BSN, CCRP;Jessica [place], MA, RCEP, CCRP, CCET;Joseph [place], [place]    Virtual Visit No    Medication changes reported     No    Fall or balance concerns reported    No    Warm-up and Cool-down Performed on first and last piece of equipment    Resistance Training Performed Yes    VAD Patient? No    PAD/SET Patient? No      Pain Assessment   Currently in Pain? No/denies                Social History   Tobacco Use  Smoking Status Former   Packs/day: 1.00   Years: 50.00   Total pack years: 50.00   Types: Cigarettes   Quit date: 12/28/2021   Years since quitting: 0.4   Passive exposure: Never  Smokeless Tobacco Never  Tobacco Comments   1 pack daily//quit 12/28/2021    Goals Met:  Proper associated with RPD/PD & O2 Sat Independence with exercise equipment Exercise tolerated well No report of concerns or symptoms today  Goals Unmet:  Not Applicable  Comments: Pt able to follow exercise prescription today without complaint.  Will continue to monitor for progression.    Dr. Emily Filbert is Medical Director for [place].  Dr. Ottie Glazier is Medical Director for [facility] Pulmonary Rehabilitation.

## 2022-06-19 ENCOUNTER — Encounter: Payer: Medicare Other | Admitting: *Deleted

## 2022-06-19 DIAGNOSIS — R0609 Other forms of dyspnea: Secondary | ICD-10-CM | POA: Diagnosis not present

## 2022-06-19 DIAGNOSIS — J449 Chronic obstructive pulmonary disease, unspecified: Secondary | ICD-10-CM

## 2022-06-19 NOTE — Progress Notes (Signed)
Daily Session Note  Patient Details  Name: Amber Holder MRN: [id number] Date of Birth: 1946-06-11 Referring Provider:   Flowsheet Row Pulmonary Rehab from 05/23/2022 in [facility] Cardiac and Pulmonary Rehab  Referring Provider Clayborn Bigness MD       Encounter Date: 06/19/2022  Check In:  Session Check In - 06/19/22 0958       Check-In   Supervising physician immediately available to respond to emergencies See telemetry face sheet for immediately available ER MD    Location [facility]-Cardiac & Pulmonary Rehab    Staff Present Antionette Fairy, BS, Exercise Physiologist;Kelly Amedeo Plenty, BS, ACSM CEP, Exercise Physiologist;Angele Wiemann Tamala Julian, RN, ADN    Virtual Visit No    Medication changes reported     No    Fall or balance concerns reported    No    Warm-up and Cool-down Performed on first and last piece of equipment    Resistance Training Performed Yes    VAD Patient? No    PAD/SET Patient? No      Pain Assessment   Currently in Pain? No/denies                Social History   Tobacco Use  Smoking Status Former   Packs/day: 1.00   Years: 50.00   Total pack years: 50.00   Types: Cigarettes   Quit date: 12/28/2021   Years since quitting: 0.4   Passive exposure: Never  Smokeless Tobacco Never  Tobacco Comments   1 pack daily//quit 12/28/2021    Goals Met:  Independence with exercise equipment Exercise tolerated well No report of concerns or symptoms today Strength training completed today  Goals Unmet:  Not Applicable  Comments: Pt able to follow exercise prescription today without complaint.  Will continue to monitor for progression.    Dr. Emily Filbert is Medical Director for [place].  Dr. Ottie Glazier is Medical Director for [facility] Pulmonary Rehabilitation.

## 2022-06-21 ENCOUNTER — Encounter: Payer: Medicare Other | Admitting: *Deleted

## 2022-06-21 DIAGNOSIS — R0609 Other forms of dyspnea: Secondary | ICD-10-CM | POA: Diagnosis not present

## 2022-06-21 DIAGNOSIS — J449 Chronic obstructive pulmonary disease, unspecified: Secondary | ICD-10-CM

## 2022-06-21 NOTE — Progress Notes (Signed)
Daily Session Note  Patient Details  Name: Amber Holder MRN: [id number] Date of Birth: 05/09/1946 Referring Provider:   Flowsheet Row Pulmonary Rehab from 05/23/2022 in [facility] Cardiac and Pulmonary Rehab  Referring Provider Clayborn Bigness MD       Encounter Date: 06/21/2022  Check In:  Session Check In - 06/21/22 1016       Check-In   Supervising physician immediately available to respond to emergencies See telemetry face sheet for immediately available ER MD    Location [facility]-Cardiac & Pulmonary Rehab    Staff Present Antionette Fairy, BS, Exercise Physiologist;Joseph Rosebud Poles, RN, [place]    Virtual Visit No    Medication changes reported     No    Fall or balance concerns reported    No    Warm-up and Cool-down Performed on first and last piece of equipment    Resistance Training Performed Yes    VAD Patient? No    PAD/SET Patient? No      Pain Assessment   Currently in Pain? No/denies                Social History   Tobacco Use  Smoking Status Former   Packs/day: 1.00   Years: 50.00   Total pack years: 50.00   Types: Cigarettes   Quit date: 12/28/2021   Years since quitting: 0.4   Passive exposure: Never  Smokeless Tobacco Never  Tobacco Comments   1 pack daily//quit 12/28/2021    Goals Met:  Independence with exercise equipment Exercise tolerated well No report of concerns or symptoms today Strength training completed today  Goals Unmet:  Not Applicable  Comments: Pt able to follow exercise prescription today without complaint.  Will continue to monitor for progression.    Dr. Emily Filbert is Medical Director for [place].  Dr. Ottie Glazier is Medical Director for [facility] Pulmonary Rehabilitation.

## 2022-06-23 ENCOUNTER — Encounter: Payer: Self-pay | Admitting: Nurse Practitioner

## 2022-06-23 ENCOUNTER — Encounter: Payer: Medicare Other | Admitting: *Deleted

## 2022-06-23 ENCOUNTER — Telehealth (INDEPENDENT_AMBULATORY_CARE_PROVIDER_SITE_OTHER): Payer: Medicare Other | Admitting: Nurse Practitioner

## 2022-06-23 VITALS — Resp 16 | Ht 65.0 in

## 2022-06-23 DIAGNOSIS — J441 Chronic obstructive pulmonary disease with (acute) exacerbation: Secondary | ICD-10-CM | POA: Diagnosis not present

## 2022-06-23 DIAGNOSIS — J449 Chronic obstructive pulmonary disease, unspecified: Secondary | ICD-10-CM | POA: Diagnosis not present

## 2022-06-23 DIAGNOSIS — R0609 Other forms of dyspnea: Secondary | ICD-10-CM | POA: Diagnosis not present

## 2022-06-23 MED ORDER — AMOXICILLIN-POT CLAVULANATE 875-125 MG PO TABS: 1.0000 | ORAL_TABLET | Freq: Two times a day (BID) | ORAL | 0 refills | Status: DC

## 2022-06-23 NOTE — Progress Notes (Signed)
Daily Session Note  Patient Details  Name: Amber Holder MRN: [id number] Date of Birth: Nov 04, 1945 Referring Provider:   Flowsheet Row Pulmonary Rehab from 05/23/2022 in [facility] Cardiac and Pulmonary Rehab  Referring Provider Clayborn Bigness MD       Encounter Date: 06/23/2022  Check In:  Session Check In - 06/23/22 1004       Check-In   Supervising physician immediately available to respond to emergencies See telemetry face sheet for immediately available ER MD    Location [facility]-Cardiac & Pulmonary Rehab    Staff Present Heath Lark, RN, BSN, CCRP;Jessica [place], MA, RCEP, CCRP, CCET;Joseph [place], [place]    Virtual Visit No    Medication changes reported     No    Fall or balance concerns reported    No    Warm-up and Cool-down Performed on first and last piece of equipment    Resistance Training Performed Yes    VAD Patient? No    PAD/SET Patient? No      Pain Assessment   Currently in Pain? No/denies                Social History   Tobacco Use  Smoking Status Former   Packs/day: 1.00   Years: 50.00   Total pack years: 50.00   Types: Cigarettes   Quit date: 12/28/2021   Years since quitting: 0.4   Passive exposure: Never  Smokeless Tobacco Never  Tobacco Comments   1 pack daily//quit 12/28/2021    Goals Met:  Proper associated with RPD/PD & O2 Sat Independence with exercise equipment Exercise tolerated well No report of concerns or symptoms today  Goals Unmet:  Not Applicable  Comments: Pt able to follow exercise prescription today without complaint.  Will continue to monitor for progression.    Dr. Emily Filbert is Medical Director for [place].  Dr. Ottie Glazier is Medical Director for [facility] Pulmonary Rehabilitation.

## 2022-06-23 NOTE — Progress Notes (Signed)
[facility] [place], [place] [address]  Internal MEDICINE  Telephone Visit  Patient Name: Amber Holder  [id number]  [id number]  Date of Service: 06/23/2022  I connected with the patient at 1145 by telephone and verified the patients identity using two identifiers.   I discussed the limitations, risks, security and privacy concerns of performing an evaluation and management service by telephone and the availability of in person appointments. I also discussed with the patient that there may be a patient responsible charge related to the service.  The patient expressed understanding and agrees to proceed.    Chief Complaint  Patient presents with   Telephone Screen    Prefers just a phone call: [phone]   Telephone Assessment   Sore Throat    Negative for Covid   Cough    Coughing up green/yellow mucus   Fever   Nasal Congestion    HPI Amber Holder presents for a telehealth virtual visit for possible URI. --sore throat, cough, nasal congestion, fever, green/yellow drainage.  --covid test negative --onset of symptoms  --postnasal drip, chest tightness   Current Medication: Outpatient Encounter Medications as of 06/23/2022  Medication Sig   acetaminophen (TYLENOL) 500 MG chewable tablet Chew 1,000 mg by mouth every 8 (eight) hours as needed for pain.   albuterol (VENTOLIN HFA) 108 (90 Base) MCG/ACT inhaler Inhale 2 puffs into the lungs every 6 (six) hours as needed for wheezing or shortness of breath.   ALPRAZolam (XANAX) 0.25 MG tablet Take one tab po bid for anxiety prn   amoxicillin-clavulanate (AUGMENTIN) 875-125 MG tablet Take 1 tablet by mouth 2 (two) times daily.   aspirin EC 81 MG tablet Take 1 tablet (81 mg total) by mouth daily. Swallow whole.   atorvastatin (LIPITOR) 20 MG tablet Take 1 tablet (20 mg total) by mouth daily.   atorvastatin (LIPITOR) 40 MG tablet Take 40 mg by mouth daily.   cholecalciferol (VITAMIN D) 400 UNITS TABS tablet Take  2,000 Units by mouth daily.   CRANBERRY PO Take 25,000 mg by mouth daily.   dapagliflozin propanediol (FARXIGA) 10 MG TABS tablet Take 1 tablet (10 mg total) by mouth daily before breakfast.   diphenhydrAMINE (BENADRYL) 50 MG tablet Take one tab 13 hrs, 7 hrs and 1 hr before procedure along with prednisone, may take one extra if experienced itching or hives   ergocalciferol (DRISDOL) 1.25 MG (50000 UT) capsule Take one cap q week   fluconazole (DIFLUCAN) 150 MG tablet Take one tab po q week for fungal infection   fluticasone (FLOVENT HFA) 110 MCG/ACT inhaler Inhale 2 puffs into the lungs 2 (two) times daily as needed.   glucose blood (ONETOUCH VERIO) test strip BLOOD SUGAR TESTING THREE TIMES DAILY . DX E11.65   hydrocortisone cream 0.5 % Apply topically as needed.    hydrocortisone cream 0.5 % Apply topically.   ibandronate (BONIVA) 150 MG tablet Take 1 tablet (150 mg total) by mouth every 30 (thirty) days.   Lancets (ONETOUCH DELICA PLUS XNATFT73U) MISC Use  as directed twice a daily DX E11.65   Magnesium 250 MG TABS Take by mouth. Takes 1 tablet 2-3 times per week   metolazone (ZAROXOLYN) 5 MG tablet Take 0.5 tablets (2.5 mg total) by mouth daily. Take 30 mins prior to your Torsemide.   montelukast (SINGULAIR) 10 MG tablet Take 10 mg by mouth daily as needed.   Multiple Vitamins-Minerals (MULTIVITAMIN ADULTS PO) Take 1 tablet by mouth daily.   omeprazole (PRILOSEC) 40  MG capsule Take 1 capsule (40 mg total) by mouth daily.   OXYGEN Inhale 3 L into the lungs. Pt uses [facility] for Oxygen   potassium chloride SA (KLOR-CON M) 20 MEQ tablet TAKE 3 TABS (60 MEQ) IN AM AND 2 TABS (40 MEQ) IN THE PM.   predniSONE & diphenhydrAMINE (CONTRAST ALLERGY PREMED PACK) 3 x 50 MG & 1 x 50 MG KIT Take 50 mg by mouth Once PRN for up to 1 dose (TAKE 13 hour prior to CT angio).   predniSONE (DELTASONE) 50 MG tablet Take one tab 13 hrs, 7 hrs and 1 hr before scheduled time along with Benadryl, may take one  extra after the procedure if experienced itching and hives   Simethicone 80 MG TABS Take 1 tablet (80 mg total) by mouth as needed (burping). As directed.   Tiotropium Bromide-Olodaterol 2.5-2.5 MCG/ACT AERS Inhale 2 puffs into the lungs daily.   torsemide (DEMADEX) 20 MG tablet Take 1 tablet (20 mg total) by mouth daily.   No facility-administered encounter medications on file as of 06/23/2022.    Surgical History: Past Surgical History:  Procedure Laterality Date   APPENDECTOMY     cataract surgery Bilateral    CHOLECYSTECTOMY     COLONOSCOPY     COLONOSCOPY WITH PROPOFOL N/A 05/28/2015   Procedure: COLONOSCOPY WITH PROPOFOL;  Surgeon: Lollie Sails, MD;  Location: [facility] ENDOSCOPY;  Service: Endoscopy;  Laterality: N/A;   COLONOSCOPY WITH PROPOFOL N/A 10/28/2018   Procedure: COLONOSCOPY WITH PROPOFOL;  Surgeon: Lollie Sails, MD;  Location: [facility] ENDOSCOPY;  Service: Endoscopy;  Laterality: N/A;   EYE SURGERY Bilateral 2015   Cataract Extraction with IOL   LITHOTRIPSY  2015   has had many stones   Mastoidotomy  1970   ROTATOR CUFF REPAIR Right 2011   TOTAL HIP ARTHROPLASTY Right 06/14/2016   Procedure: TOTAL HIP ARTHROPLASTY;  Surgeon: Dereck Leep, MD;  Location: [facility] ORS;  Service: Orthopedics;  Laterality: Right;    Medical History: Past Medical History:  Diagnosis Date   Anxiety 01/02/2012   Arthritis    Asthma    Blockage of coronary artery of heart (HCC)    Chronic kidney disease    Kidney stones   Colon polyps    COPD (chronic obstructive pulmonary disease) (HCC)    Diabetes mellitus without complication (HCC)    GERD (gastroesophageal reflux disease)    HOH (hard of hearing)    Bilateral hearing aids   Kidney stone    Osteoporosis    Sleep apnea    Does not use C-PAP on a regular basis   Venous stasis     Family History: Family History  Problem Relation Age of Onset   Breast cancer Maternal Aunt 69   Bladder Cancer Neg Hx    Kidney cancer Neg Hx      Social History   Socioeconomic History   Marital status: Divorced    Spouse name: Not on file   Number of children: Not on file   Years of education: Not on file   Highest education level: Not on file  Occupational History   Not on file  Tobacco Use   Smoking status: Former    Packs/day: 1.00    Years: 50.00    Total pack years: 50.00    Types: Cigarettes    Quit date: 12/28/2021    Years since quitting: 0.5    Passive exposure: Never   Smokeless tobacco: Never   Tobacco  comments:    1 pack daily//quit 12/28/2021  Vaping Use   Vaping Use: Never used  Substance and Sexual Activity   Alcohol use: Not Currently    Comment: very rarely    Drug use: No   Sexual activity: Not Currently  Other Topics Concern   Not on file  Social History Narrative   Not on file   Social Determinants of Health   Financial Resource Strain: Not on file  Food Insecurity: Not on file  Transportation Needs: Not on file  Physical Activity: Not on file  Stress: Not on file  Social Connections: Not on file  Intimate Partner Violence: Not on file      Review of Systems  Constitutional:  Positive for fever.  HENT:  Positive for congestion, postnasal drip, sore throat and voice change. Negative for sinus pressure and sinus pain.   Respiratory:  Positive for cough, chest tightness, shortness of breath and wheezing.   Cardiovascular: Negative.  Negative for chest pain and palpitations.  Gastrointestinal: Negative.  Negative for abdominal pain, diarrhea, nausea and vomiting.  Musculoskeletal: Negative.  Negative for myalgias.  Neurological:  Positive for headaches.    Vital Signs: Resp 16   Ht _0  (1.651 m)   BMI 31.58 kg/m    Observation/Objective: She is alert and oriented and engages in conversation appropriately. She does not sound as though she is in any acute distress over telephone call.    Assessment/Plan: 1. Obstructive chronic bronchitis with exacerbation  ([place]) Empiric antibiotic treatment prescribed - amoxicillin-clavulanate (AUGMENTIN) 875-125 MG tablet; Take 1 tablet by mouth 2 (two) times daily.  Dispense: 20 tablet; Refill: 0   General Counseling: Pansie verbalizes understanding of the findings of today's phone visit and agrees with plan of treatment. I have discussed any further diagnostic evaluation that may be needed or ordered today. We also reviewed her medications today. she has been encouraged to call the office with any questions or concerns that should arise related to todays visit.  Return if symptoms worsen or fail to improve.   No orders of the defined types were placed in this encounter.   Meds ordered this encounter  Medications   amoxicillin-clavulanate (AUGMENTIN) 875-125 MG tablet    Sig: Take 1 tablet by mouth 2 (two) times daily.    Dispense:  20 tablet    Refill:  0    Time spent:10 Minutes Time spent with patient included reviewing progress notes, labs, imaging studies, and discussing plan for follow up.  [place] Controlled Substance Database was reviewed by me for overdose risk score (ORS) if appropriate.  This patient was seen by Jonetta Osgood, FNP-C in collaboration with Dr. Clayborn Bigness as a part of collaborative care agreement.  Kieanna Rollo R. Valetta Fuller, MSN, FNP-C Internal medicine

## 2022-06-26 ENCOUNTER — Encounter: Payer: Medicare Other | Admitting: *Deleted

## 2022-06-26 ENCOUNTER — Encounter (INDEPENDENT_AMBULATORY_CARE_PROVIDER_SITE_OTHER): Payer: Self-pay

## 2022-06-26 DIAGNOSIS — J449 Chronic obstructive pulmonary disease, unspecified: Secondary | ICD-10-CM | POA: Diagnosis not present

## 2022-06-26 DIAGNOSIS — R0609 Other forms of dyspnea: Secondary | ICD-10-CM | POA: Diagnosis not present

## 2022-06-26 NOTE — Progress Notes (Signed)
Daily Session Note  Patient Details  Name: Amber Holder MRN: [id number] Date of Birth: [birth year] Referring Provider:   Flowsheet Row Pulmonary Rehab from 05/23/2022 in [facility] Cardiac and Pulmonary Rehab  Referring Provider Clayborn Bigness MD       Encounter Date: 06/26/2022  Check In:  Session Check In - 06/26/22 0959       Check-In   Supervising physician immediately available to respond to emergencies See telemetry face sheet for immediately available ER MD    Location [facility]-Cardiac & Pulmonary Rehab    Staff Present Antionette Fairy, BS, Exercise Physiologist;Kelly Amedeo Plenty, BS, ACSM CEP, Exercise Physiologist;Sidni Fusco Tamala Julian, RN, ADN    Virtual Visit No    Medication changes reported     No    Fall or balance concerns reported    No    Tobacco Cessation No Change    Warm-up and Cool-down Performed on first and last piece of equipment    Resistance Training Performed Yes    VAD Patient? No    PAD/SET Patient? No      Pain Assessment   Currently in Pain? No/denies                Social History   Tobacco Use  Smoking Status Former   Packs/day: 1.00   Years: 50.00   Total pack years: 50.00   Types: Cigarettes   Quit date: 12/28/2021   Years since quitting: 0.4   Passive exposure: Never  Smokeless Tobacco Never  Tobacco Comments   1 pack daily//quit 12/28/2021    Goals Met:  Independence with exercise equipment Exercise tolerated well No report of concerns or symptoms today Strength training completed today  Goals Unmet:  Not Applicable  Comments: Pt able to follow exercise prescription today without complaint.  Will continue to monitor for progression.    Dr. Emily Filbert is Medical Director for [place].  Dr. Ottie Glazier is Medical Director for [facility] Pulmonary Rehabilitation.

## 2022-06-28 ENCOUNTER — Encounter: Payer: Self-pay | Admitting: *Deleted

## 2022-06-28 ENCOUNTER — Encounter: Payer: Medicare Other | Attending: Internal Medicine | Admitting: *Deleted

## 2022-06-28 DIAGNOSIS — J9611 Chronic respiratory failure with hypoxia: Secondary | ICD-10-CM | POA: Insufficient documentation

## 2022-06-28 DIAGNOSIS — J449 Chronic obstructive pulmonary disease, unspecified: Secondary | ICD-10-CM | POA: Insufficient documentation

## 2022-06-28 NOTE — Progress Notes (Signed)
Pulmonary Individual Treatment Plan  Patient Details  Name: Amber Holder MRN: [id number] Date of Birth: Sep 04, 1945 Referring Provider:   Flowsheet Row Pulmonary Rehab from 05/23/2022 in [facility] Cardiac and Pulmonary Rehab  Referring Provider Clayborn Bigness MD       Initial Encounter Date:  Flowsheet Row Pulmonary Rehab from 05/23/2022 in [facility] Cardiac and Pulmonary Rehab  Date 05/23/22       Visit Diagnosis: Chronic obstructive pulmonary disease, unspecified COPD type ([place])  Patient's Home Medications on Admission:  Current Outpatient Medications:    acetaminophen (TYLENOL) 500 MG chewable tablet, Chew 1,000 mg by mouth every 8 (eight) hours as needed for pain., Disp: , Rfl:    albuterol (VENTOLIN HFA) 108 (90 Base) MCG/ACT inhaler, Inhale 2 puffs into the lungs every 6 (six) hours as needed for wheezing or shortness of breath., Disp: 18 g, Rfl: 2   ALPRAZolam (XANAX) 0.25 MG tablet, Take one tab po bid for anxiety prn, Disp: 30 tablet, Rfl: 1   amoxicillin-clavulanate (AUGMENTIN) 875-125 MG tablet, Take 1 tablet by mouth 2 (two) times daily., Disp: 20 tablet, Rfl: 0   aspirin EC 81 MG tablet, Take 1 tablet (81 mg total) by mouth daily. Swallow whole., Disp: 90 tablet, Rfl: 3   atorvastatin (LIPITOR) 20 MG tablet, Take 1 tablet (20 mg total) by mouth daily., Disp: 90 tablet, Rfl: 3   atorvastatin (LIPITOR) 40 MG tablet, Take 40 mg by mouth daily., Disp: , Rfl:    cholecalciferol (VITAMIN D) 400 UNITS TABS tablet, Take 2,000 Units by mouth daily., Disp: , Rfl:    CRANBERRY PO, Take 25,000 mg by mouth daily., Disp: , Rfl:    dapagliflozin propanediol (FARXIGA) 10 MG TABS tablet, Take 1 tablet (10 mg total) by mouth daily before breakfast., Disp: 90 tablet, Rfl: 3   diphenhydrAMINE (BENADRYL) 50 MG tablet, Take one tab 13 hrs, 7 hrs and 1 hr before procedure along with prednisone, may take one extra if experienced itching or hives, Disp: 4 tablet, Rfl: 0   ergocalciferol (DRISDOL) 1.25 MG  (50000 UT) capsule, Take one cap q week, Disp: 12 capsule, Rfl: 3   fluconazole (DIFLUCAN) 150 MG tablet, Take one tab po q week for fungal infection, Disp: 4 tablet, Rfl: 0   fluticasone (FLOVENT HFA) 110 MCG/ACT inhaler, Inhale 2 puffs into the lungs 2 (two) times daily as needed., Disp: , Rfl:    glucose blood (ONETOUCH VERIO) test strip, BLOOD SUGAR TESTING THREE TIMES DAILY . DX E11.65, Disp: 100 strip, Rfl: 3   hydrocortisone cream 0.5 %, Apply topically as needed. , Disp: , Rfl:    hydrocortisone cream 0.5 %, Apply topically., Disp: , Rfl:    ibandronate (BONIVA) 150 MG tablet, Take 1 tablet (150 mg total) by mouth every 30 (thirty) days., Disp: 3 tablet, Rfl: 3   Lancets (ONETOUCH DELICA PLUS [id number]) MISC, Use  as directed twice a daily DX E11.65, Disp: 100 each, Rfl: 3   Magnesium 250 MG TABS, Take by mouth. Takes 1 tablet 2-3 times per week, Disp: , Rfl:    metolazone (ZAROXOLYN) 5 MG tablet, Take 0.5 tablets (2.5 mg total) by mouth daily. Take 30 mins prior to your Torsemide., Disp: 30 tablet, Rfl: 1   montelukast (SINGULAIR) 10 MG tablet, Take 10 mg by mouth daily as needed., Disp: , Rfl:    Multiple Vitamins-Minerals (MULTIVITAMIN ADULTS PO), Take 1 tablet by mouth daily., Disp: , Rfl:    omeprazole (PRILOSEC) 40 MG capsule, Take 1 capsule (40  mg total) by mouth daily., Disp: , Rfl:    OXYGEN, Inhale 3 L into the lungs. Pt uses [facility] for Oxygen, Disp: , Rfl:    potassium chloride SA (KLOR-CON M) 20 MEQ tablet, TAKE 3 TABS (60 MEQ) IN AM AND 2 TABS (40 MEQ) IN THE PM., Disp: 300 tablet, Rfl: 2   predniSONE & diphenhydrAMINE (CONTRAST ALLERGY PREMED PACK) 3 x 50 MG & 1 x 50 MG KIT, Take 50 mg by mouth Once PRN for up to 1 dose (TAKE 13 hour prior to CT angio)., Disp: 1 kit, Rfl: 0   predniSONE (DELTASONE) 50 MG tablet, Take one tab 13 hrs, 7 hrs and 1 hr before scheduled time along with Benadryl, may take one extra after the procedure if experienced itching and hives, Disp: 4  tablet, Rfl: 0   Simethicone 80 MG TABS, Take 1 tablet (80 mg total) by mouth as needed (burping). As directed., Disp: , Rfl: 0   Tiotropium Bromide-Olodaterol 2.5-2.5 MCG/ACT AERS, Inhale 2 puffs into the lungs daily., Disp: 1 each, Rfl: 4   torsemide (DEMADEX) 20 MG tablet, Take 1 tablet (20 mg total) by mouth daily., Disp: 30 tablet, Rfl: 1  Past Medical History: Past Medical History:  Diagnosis Date   Anxiety 01/02/2012   Arthritis    Asthma    Blockage of coronary artery of heart (HCC)    Chronic kidney disease    Kidney stones   Colon polyps    COPD (chronic obstructive pulmonary disease) (HCC)    Diabetes mellitus without complication (HCC)    GERD (gastroesophageal reflux disease)    HOH (hard of hearing)    Bilateral hearing aids   Kidney stone    Osteoporosis    Sleep apnea    Does not use C-PAP on a regular basis   Venous stasis     Tobacco Use: Social History   Tobacco Use  Smoking Status Former   Packs/day: 1.00   Years: 50.00   Total pack years: 50.00   Types: Cigarettes   Quit date: 12/28/2021   Years since quitting: 0.4   Passive exposure: Never  Smokeless Tobacco Never  Tobacco Comments   1 pack daily//quit 12/28/2021    Labs: Review Flowsheet  More data exists      Latest Ref Rng & Units 10/03/2021 01/10/2022 01/16/2022 04/24/2022 05/19/2022  Labs for ITP Cardiac and Pulmonary Rehab  Cholestrol 0 - 200 mg/dL - - 261  - 162   LDL (calc) 0 - 99 mg/dL - - 182  - 92   HDL-C >40 mg/dL - - 52  - 48   Trlycerides <150 mg/dL - - 134  - 111   Hemoglobin A1c 4.0 - 5.6 % 5.7  6.5  - 6.2  -     Pulmonary Assessment Scores:  Pulmonary Assessment Scores     Row Name 05/23/22 1415         ADL UCSD   ADL Phase Entry     SOB Score total 62     Rest 2     Walk 3     Stairs 5     Bath 2     Dress 2     Shop 3       CAT Score   CAT Score 17       mMRC Score   mMRC Score 2              UCSD: Self-administered rating of dyspnea associated  with activities of daily living (ADLs) 6-point scale (0 = "not at all" to 5 = "maximal or unable to do because of breathlessness")  Scoring Scores range from 0 to 120.  Minimally important difference is 5 units  CAT: CAT can identify the health impairment of COPD patients and is better correlated with disease progression.  CAT has a scoring range of zero to 40. The CAT score is classified into four groups of low (less than 10), medium (10 - 20), high (21-30) and very high (31-40) based on the impact level of disease on health status. A CAT score over 10 suggests significant symptoms.  A worsening CAT score could be explained by an exacerbation, poor medication adherence, poor inhaler technique, or progression of COPD or comorbid conditions.  CAT MCID is 2 points  mMRC: mMRC (Modified Medical Research Council) Dyspnea Scale is used to assess the degree of baseline functional disability in patients of respiratory disease due to dyspnea. No minimal important difference is established. A decrease in score of 1 point or greater is considered a positive change.   Pulmonary Function Assessment:  Pulmonary Function Assessment - 05/15/22 1052       Breath   Shortness of Breath Yes;Limiting activity             Exercise Target Goals: Exercise Program Goal: Individual exercise prescription set using results from initial 6 min walk test and THRR while considering  patient's activity barriers and safety.   Exercise Prescription Goal: Initial exercise prescription builds to 30-45 minutes a day of aerobic activity, 2-3 days per week.  Home exercise guidelines will be given to patient during program as part of exercise prescription that the participant will acknowledge.  Education: Aerobic Exercise: - Group verbal and visual presentation on the components of exercise prescription. Introduces F.I.T.T principle from ACSM for exercise prescriptions.  Reviews F.I.T.T. principles of aerobic exercise  including progression. Written material given at graduation.   Education: Resistance Exercise: - Group verbal and visual presentation on the components of exercise prescription. Introduces F.I.T.T principle from ACSM for exercise prescriptions  Reviews F.I.T.T. principles of resistance exercise including progression. Written material given at graduation.    Education: Exercise & Equipment Safety: - Individual verbal instruction and demonstration of equipment use and safety with use of the equipment. Flowsheet Row Pulmonary Rehab from 06/21/2022 in [facility] Cardiac and Pulmonary Rehab  Date 05/15/22  Educator [facility]  Instruction Review Code 1- Verbalizes Understanding       Education: Exercise Physiology & General Exercise Guidelines: - Group verbal and written instruction with models to review the exercise physiology of the cardiovascular system and associated critical values. Provides general exercise guidelines with specific guidelines to those with heart or lung disease.    Education: Flexibility, Balance, Mind/Body Relaxation: - Group verbal and visual presentation with interactive activity on the components of exercise prescription. Introduces F.I.T.T principle from ACSM for exercise prescriptions. Reviews F.I.T.T. principles of flexibility and balance exercise training including progression. Also discusses the mind body connection.  Reviews various relaxation techniques to help reduce and manage stress (i.e. Deep breathing, progressive muscle relaxation, and visualization). Balance handout provided to take home. Written material given at graduation.   Activity Barriers & Risk Stratification:  Activity Barriers & Cardiac Risk Stratification - 05/23/22 1452       Activity Barriers & Cardiac Risk Stratification   Activity Barriers Right Hip Replacement;Deconditioning;Muscular Weakness;Shortness of Breath;Decreased Ventricular Function             6  Minute Walk:  6 Minute Walk      Row Name 05/23/22 1450         6 Minute Walk   Phase Initial     Distance 990 feet     Walk Time 6 minutes     # of Rest Breaks 0     MPH 1.87     METS 1.62     RPE 15     Perceived Dyspnea  3     VO2 Peak 5.67     Symptoms Yes (comment)     Comments Right hip tightness, SOB     Resting HR 72 bpm     Resting BP 106/66     Resting Oxygen Saturation  93 %     Exercise Oxygen Saturation  during 6 min walk 87 %     Max Ex. HR 90 bpm     Max Ex. BP 124/62     2 Minute Post BP 104/66       Interval HR   1 Minute HR 83     2 Minute HR 85     3 Minute HR 87     4 Minute HR 88     5 Minute HR 89     6 Minute HR 90     2 Minute Post HR 77     Interval Heart Rate? Yes       Interval Oxygen   Interval Oxygen? Yes     Baseline Oxygen Saturation % 93 %     1 Minute Oxygen Saturation % 92 %     1 Minute Liters of Oxygen 3 L     2 Minute Oxygen Saturation % 88 %     2 Minute Liters of Oxygen 3 L     3 Minute Oxygen Saturation % 87 %     3 Minute Liters of Oxygen 3 L     4 Minute Oxygen Saturation % 88 %     4 Minute Liters of Oxygen 3 L     5 Minute Oxygen Saturation % 87 %     5 Minute Liters of Oxygen 3 L     6 Minute Oxygen Saturation % 87 %     6 Minute Liters of Oxygen 3 L     2 Minute Post Oxygen Saturation % 94 %     2 Minute Post Liters of Oxygen 3 L             Oxygen Initial Assessment:  Oxygen Initial Assessment - 05/15/22 1050       Home Oxygen   Home Oxygen Device Home Concentrator;E-Tanks;Portable Concentrator    Sleep Oxygen Prescription Continuous    Home Exercise Oxygen Prescription Continuous    Liters per minute 3    Home Resting Oxygen Prescription Continuous    Liters per minute 2    Compliance with Home Oxygen Use Yes      Initial 6 min Walk   Oxygen Used Continuous;Portable Concentrator    Liters per minute 3      Program Oxygen Prescription   Program Oxygen Prescription Continuous    Liters per minute 3      Intervention   Short  Term Goals To learn and exhibit compliance with exercise, home and travel O2 prescription;To learn and understand importance of monitoring SPO2 with pulse oximeter and demonstrate accurate use of the pulse oximeter.;To learn and understand importance of maintaining oxygen saturations>88%;To learn and demonstrate proper pursed  lip breathing techniques or other breathing techniques. ;To learn and demonstrate proper use of respiratory medications    Long  Term Goals Exhibits compliance with exercise, home  and travel O2 prescription;Verbalizes importance of monitoring SPO2 with pulse oximeter and return demonstration;Maintenance of O2 saturations>88%;Exhibits proper breathing techniques, such as pursed lip breathing or other method taught during program session;Compliance with respiratory medication;Demonstrates proper use of MDI's             Oxygen Re-Evaluation:  Oxygen Re-Evaluation     Row Name 05/24/22 1029 06/16/22 1008           Program Oxygen Prescription   Program Oxygen Prescription -- Continuous      Liters per minute -- 3        Home Oxygen   Home Oxygen Device -- Home Concentrator;E-Tanks;Portable Concentrator      Sleep Oxygen Prescription -- Continuous      Liters per minute -- 2.5      Home Exercise Oxygen Prescription -- Continuous      Liters per minute -- 3      Home Resting Oxygen Prescription -- Continuous      Liters per minute -- 2      Compliance with Home Oxygen Use -- Yes        Goals/Expected Outcomes   Short Term Goals -- To learn and demonstrate proper use of respiratory medications      Long  Term Goals -- Demonstrates proper use of MDI's      Comments Reviewed PLB technique with pt.  Talked about how it works and it's importance in maintaining their exercise saturations. --      Goals/Expected Outcomes Short: Become more profiecient at using PLB.   Long: Become independent at using PLB. --               Oxygen Discharge (Final Oxygen  Re-Evaluation):  Oxygen Re-Evaluation - 06/16/22 1008       Program Oxygen Prescription   Program Oxygen Prescription Continuous    Liters per minute 3      Home Oxygen   Home Oxygen Device Home Concentrator;E-Tanks;Portable Concentrator    Sleep Oxygen Prescription Continuous    Liters per minute 2.5    Home Exercise Oxygen Prescription Continuous    Liters per minute 3    Home Resting Oxygen Prescription Continuous    Liters per minute 2    Compliance with Home Oxygen Use Yes      Goals/Expected Outcomes   Short Term Goals To learn and demonstrate proper use of respiratory medications    Long  Term Goals Demonstrates proper use of MDI's             Initial Exercise Prescription:  Initial Exercise Prescription - 05/23/22 1400       Date of Initial Exercise RX and Referring Provider   Date 05/23/22    Referring Provider Clayborn Bigness MD      Oxygen   Oxygen Continuous    Liters 3    Maintain Oxygen Saturation 88% or higher      Treadmill   MPH 1.4    Grade 0    Minutes 15    METs 2.07      NuStep   Level 1    SPM 80    Minutes 15    METs 1.6      REL-XR   Level 1    Speed 50    Minutes 15    METs  1.6      Prescription Details   Frequency (times per week) 3    Duration Progress to 30 minutes of continuous aerobic without signs/symptoms of physical distress      Intensity   THRR 40-80% of Max Heartrate 100 - 129    Ratings of Perceived Exertion 11-13    Perceived Dyspnea 0-4      Progression   Progression Continue to progress workloads to maintain intensity without signs/symptoms of physical distress.      Resistance Training   Training Prescription Yes    Weight 3 lb    Reps 10-15             Perform Capillary Blood Glucose checks as needed.  Exercise Prescription Changes:   Exercise Prescription Changes     Row Name 05/23/22 1400 06/06/22 1400 06/20/22 1400         Response to Exercise   Blood Pressure (Admit) 106/66 126/58  110/60     Blood Pressure (Exercise) 124/62 136/72 126/64     Blood Pressure (Exit) 104/66 116/58 124/80     Heart Rate (Admit) 72 bpm 70 bpm 65 bpm     Heart Rate (Exercise) 90 bpm 86 bpm 87 bpm     Heart Rate (Exit) 65 bpm 79 bpm 67 bpm     Oxygen Saturation (Admit) 93 % 95 % 95 %     Oxygen Saturation (Exercise) 87 % 92 % 92 %     Oxygen Saturation (Exit) 94 % 94 % 98 %     Rating of Perceived Exertion (Exercise) _0 Perceived Dyspnea (Exercise) _1 Symptoms SOB, right hip tightness SOB SOB     Comments walk test results -- --     Duration -- Progress to 30 minutes of  aerobic without signs/symptoms of physical distress Continue with 30 min of aerobic exercise without signs/symptoms of physical distress.     Intensity -- THRR unchanged THRR unchanged       Progression   Progression -- Continue to progress workloads to maintain intensity without signs/symptoms of physical distress. Continue to progress workloads to maintain intensity without signs/symptoms of physical distress.     Average METs -- 2.37 2.49       Resistance Training   Training Prescription -- Yes Yes     Weight -- 3 lb 3 lb     Reps -- 10-15 10-15       Interval Training   Interval Training -- No No       Oxygen   Oxygen -- Continuous Continuous     Liters -- 3 3       Treadmill   MPH -- 2 2.2     Grade -- 0 0     Minutes -- 15 15     METs -- 2.53 2.68       NuStep   Level -- 3 3     Minutes -- 15 15     METs -- 2.3 2.3       REL-XR   Level -- 1 1     Minutes -- 15 15     METs -- 3 2.5       Oxygen   Maintain Oxygen Saturation -- 88% or higher 88% or higher              Exercise Comments:   Exercise Goals and Review:   Exercise Goals  [place] Name 05/23/22 1456             Exercise Goals   Increase Physical Activity Yes       Intervention Provide advice, education, support and counseling about physical activity/exercise needs.;Develop an individualized exercise  prescription for aerobic and resistive training based on initial evaluation findings, risk stratification, comorbidities and participant's personal goals.       Expected Outcomes Short Term: Attend rehab on a regular basis to increase amount of physical activity.;Long Term: Add in home exercise to make exercise part of routine and to increase amount of physical activity.;Long Term: Exercising regularly at least 3-5 days a week.       Increase Strength and Stamina Yes       Intervention Provide advice, education, support and counseling about physical activity/exercise needs.;Develop an individualized exercise prescription for aerobic and resistive training based on initial evaluation findings, risk stratification, comorbidities and participant's personal goals.       Expected Outcomes Short Term: Increase workloads from initial exercise prescription for resistance, speed, and METs.;Short Term: Perform resistance training exercises routinely during rehab and add in resistance training at home;Long Term: Improve cardiorespiratory fitness, muscular endurance and strength as measured by increased METs and functional capacity (6MWT)       Able to understand and use rate of perceived exertion (RPE) scale Yes       Intervention Provide education and explanation on how to use RPE scale       Expected Outcomes Short Term: Able to use RPE daily in rehab to express subjective intensity level;Long Term:  Able to use RPE to guide intensity level when exercising independently       Able to understand and use Dyspnea scale Yes       Intervention Provide education and explanation on how to use Dyspnea scale       Expected Outcomes Short Term: Able to use Dyspnea scale daily in rehab to express subjective sense of shortness of breath during exertion;Long Term: Able to use Dyspnea scale to guide intensity level when exercising independently       Knowledge and understanding of Target Heart Rate Range (THRR) Yes        Intervention Provide education and explanation of THRR including how the numbers were predicted and where they are located for reference       Expected Outcomes Short Term: Able to state/look up THRR;Long Term: Able to use THRR to govern intensity when exercising independently;Short Term: Able to use daily as guideline for intensity in rehab       Able to check pulse independently Yes       Intervention Provide education and demonstration on how to check pulse in carotid and radial arteries.;Review the importance of being able to check your own pulse for safety during independent exercise       Expected Outcomes Short Term: Able to explain why pulse checking is important during independent exercise;Long Term: Able to check pulse independently and accurately       Understanding of Exercise Prescription Yes       Intervention Provide education, explanation, and written materials on patient's individual exercise prescription       Expected Outcomes Short Term: Able to explain program exercise prescription;Long Term: Able to explain home exercise prescription to exercise independently                Exercise Goals Re-Evaluation :  Exercise Goals Re-Evaluation     Row Name 05/24/22 1010 06/06/22  1416 06/20/22 1434         Exercise Goal Re-Evaluation   Exercise Goals Review -- Increase Physical Activity;Increase Strength and Stamina;Understanding of Exercise Prescription Increase Physical Activity;Increase Strength and Stamina;Understanding of Exercise Prescription     Comments Reviewed RPE and dyspnea scales, THR and program prescription with pt today.  Pt voiced understanding and was given a copy of goals to take home. Catelynn is doing well with rehab. She had an overall average MET level of 2.37 METs. She also increased her speed on the treadmill up to 2 mph. She also improved to level 3 on the T4. We will continue to monitor her progress in the program. Shayley continues to do well in rehab. She  did increase her treadmill speed to 2.2 mph. her oxygen saturations are staying above 88% with exercise. She has been at a consistent level 1 on the XR and would benefit from increasing that. We will continue to monitor.     Expected Outcomes Short: Use RPE daily to regulate intensity. Long: Follow program prescription in THR. Short: Begin to add incline on the treadmill. Long: Continue to increase strength and stamina. Short: Increase level on XR Long: Continue to increase overall MET level              Discharge Exercise Prescription (Final Exercise Prescription Changes):  Exercise Prescription Changes - 06/20/22 1400       Response to Exercise   Blood Pressure (Admit) 110/60    Blood Pressure (Exercise) 126/64    Blood Pressure (Exit) 124/80    Heart Rate (Admit) 65 bpm    Heart Rate (Exercise) 87 bpm    Heart Rate (Exit) 67 bpm    Oxygen Saturation (Admit) 95 %    Oxygen Saturation (Exercise) 92 %    Oxygen Saturation (Exit) 98 %    Rating of Perceived Exertion (Exercise) 13    Perceived Dyspnea (Exercise) 3    Symptoms SOB    Duration Continue with 30 min of aerobic exercise without signs/symptoms of physical distress.    Intensity THRR unchanged      Progression   Progression Continue to progress workloads to maintain intensity without signs/symptoms of physical distress.    Average METs 2.49      Resistance Training   Training Prescription Yes    Weight 3 lb    Reps 10-15      Interval Training   Interval Training No      Oxygen   Oxygen Continuous    Liters 3      Treadmill   MPH 2.2    Grade 0    Minutes 15    METs 2.68      NuStep   Level 3    Minutes 15    METs 2.3      REL-XR   Level 1    Minutes 15    METs 2.5      Oxygen   Maintain Oxygen Saturation 88% or higher             Nutrition:  Target Goals: Understanding of nutrition guidelines, daily intake of sodium <1574m, cholesterol <2022m calories 30% from fat and 7% or less from  saturated fats, daily to have 5 or more servings of fruits and vegetables.  Education: All About Nutrition: -Group instruction provided by verbal, written material, interactive activities, discussions, models, and posters to present general guidelines for heart healthy nutrition including fat, fiber, MyPlate, the role of sodium in heart healthy  nutrition, utilization of the nutrition label, and utilization of this knowledge for meal planning. Follow up email sent as well. Written material given at graduation. Flowsheet Row Pulmonary Rehab from 06/21/2022 in [facility] Cardiac and Pulmonary Rehab  Date 05/24/22  Educator [facility]  Instruction Review Code 1- Verbalizes Understanding       Biometrics:  Pre Biometrics - 05/23/22 1449       Pre Biometrics   Height 5' 4.6" (1.641 m)    Weight 186 lb 6.4 oz (84.6 kg)    BMI (Calculated) 31.4    Single Leg Stand 7.3 seconds              Nutrition Therapy Plan and Nutrition Goals:  Nutrition Therapy & Goals - 06/05/22 1301       Nutrition Therapy   Diet Heart healthy, low Na, T2DM MNT, Pulmonary MNT    Protein (specify units) 95-100g    Fiber 28 grams    Whole Grain Foods 3 servings    Saturated Fats 15 max. grams    Fruits and Vegetables 8 servings/day    Sodium 1.5 grams      Personal Nutrition Goals   Nutrition Goal ST: practice MyPlate guidelines, try out sourdough bread or whole grain bakery bread LT: meet protein/energy needs, maintain A1c < 7, limit Na <1.5g/day, meet fiber needs of at least 28g/day    Comments 76 y.o. F admitted to pulmonary rehab for COPD. PMHx includes GERD, T2DM (recent A1C 6.2), OSA, osteoporosis, anxiety, arthritis, hard of hearing with hearing aids, CKD (no stage on file). PSHx cholecystectomy, rotator cuff repair, THA. Relevant medications includes xanax, lipitor, wellbutrin, vit D3 and ergocalciferol, cranberry tablets, farxiga, flovent, furosemide, magnesium, metformin, metolazone, MVI with minerals, macrobid,  3L O2, omeprazole, K+, prednisone, simethicone, torsemide. PYP Score: 68. Vegetables & Fruits 7/12. Breads, Grains & Cereals 8/12. Red & Processed Meat 8/12. Poultry 2/2. Fish & Shellfish 1/4. Beans, Nuts & Seeds 2/4. Milk & Dairy Foods 3/6. Toppings, Oils, Seasonings & Salt 13/20. Sweets, Snacks & Restaurant Food 14/14. Beverages 10/10.  Today had leftover asparagus with salami and egg with bread roll. she also enjoys eggs on toast (arnold white bread - this is due to price as she does not feel the whole wheat is good quality. She would be open to getting bakery sourdough bread - she likes [employer]. She will take her fluid pill later in the day when she has to run errands, has plans, or when she comes to rehab which can cause her to be up all night. When she goes out to eat at an [place] instead of fast food. She will go out to eat at least 2x/week (once with her son and his wife). She prefers to make food at home. She reports having at least 1 vegetable serving per day and sometimes with have shredded carrots or cababge with sugar and lemon for dessert. She reports being mindful of her sodium and does not use any with cooking.  Reviewed heart healthy eating, diabetes friendly eating, and pulmonary MNT.      Intervention Plan   Intervention Prescribe, educate and counsel regarding individualized specific dietary modifications aiming towards targeted core components such as weight, hypertension, lipid management, diabetes, heart failure and other comorbidities.    Expected Outcomes Short Term Goal: Understand basic principles of dietary content, such as calories, fat, sodium, cholesterol and nutrients.;Short Term Goal: A plan has been developed with personal nutrition goals set during dietitian appointment.;Long Term Goal: Adherence to  prescribed nutrition plan.             Nutrition Assessments:  MEDIFICTS Score Key: ?70 Need to make dietary changes  40-70 Heart Healthy Diet ?  40 Therapeutic Level Cholesterol Diet  Flowsheet Row Pulmonary Rehab from 05/23/2022 in [facility]monary Rehab  Picture Your Plate Total Score on Admission 68      Picture Your Plate Scores: <19 Unhealthy dietary pattern with much room for improvement. 41-50 Dietary pattern unlikely to meet recommendations for good health and room for improvement. 51-60 More healthful dietary pattern, with some room for improvement.  >60 Healthy dietary pattern, although there may be some specific behaviors that could be improved.   Nutrition Goals Re-Evaluation:  Nutrition Goals Re-Evaluation     [place] Name 06/16/22 1013             Goals   Current Weight 190 lb (86.2 kg)       Nutrition Goal Lose some weight       Comment Patient was informed on why it is important to maintain a balanced diet when dealing with Respiratory issues. Explained that it takes a lot of energy to breath and when they are short of breath often they will need to have a good diet to help keep up with the calories they are expending for breathing.       Expected Outcome Short: Choose and plan snacks accordingly to patients caloric intake to improve breathing. Long: Maintain a diet independently that meets their caloric intake to aid in daily shortness of breath.                Nutrition Goals Discharge (Final Nutrition Goals Re-Evaluation):  Nutrition Goals Re-Evaluation - 06/16/22 1013       Goals   Current Weight 190 lb (86.2 kg)    Nutrition Goal Lose some weight    Comment Patient was informed on why it is important to maintain a balanced diet when dealing with Respiratory issues. Explained that it takes a lot of energy to breath and when they are short of breath often they will need to have a good diet to help keep up with the calories they are expending for breathing.    Expected Outcome Short: Choose and plan snacks accordingly to patients caloric intake to improve breathing. Long: Maintain a diet  independently that meets their caloric intake to aid in daily shortness of breath.             Psychosocial: Target Goals: Acknowledge presence or absence of significant depression and/or stress, maximize coping skills, provide positive support system. Participant is able to verbalize types and ability to use techniques and skills needed for reducing stress and depression.   Education: Stress, Anxiety, and Depression - Group verbal and visual presentation to define topics covered.  Reviews how body is impacted by stress, anxiety, and depression.  Also discusses healthy ways to reduce stress and to treat/manage anxiety and depression.  Written material given at graduation. Flowsheet Row Pulmonary Rehab from 06/21/2022 in [facility] Cardiac and Pulmonary Rehab  Date 06/21/22  Educator [facility]  Instruction Review Code 1- [employer] Understanding       Education: Sleep Hygiene -Provides group verbal and written instruction about how sleep can affect your health.  Define sleep hygiene, discuss sleep cycles and impact of sleep habits. Review good sleep hygiene tips.    Initial Review & Psychosocial Screening:  Initial Psych Review & Screening - 05/15/22 1054  Initial Review   Current issues with Current Psychotropic Meds      Family Dynamics   Good Support System? Yes    Comments She took WellButrin for awhile to quit smoking and she did. She has a good family support system.      Barriers   Psychosocial barriers to participate in program The patient should benefit from training in stress management and relaxation.      Screening Interventions   Interventions Encouraged to exercise;To provide support and resources with identified psychosocial needs;Provide feedback about the scores to participant    Expected Outcomes Short Term goal: Utilizing psychosocial counselor, staff and physician to assist with identification of specific Stressors or current issues interfering with healing  process. Setting desired goal for each stressor or current issue identified.;Long Term Goal: Stressors or current issues are controlled or eliminated.;Short Term goal: Identification and review with participant of any Quality of Life or Depression concerns found by scoring the questionnaire.;Long Term goal: The participant improves quality of Life and PHQ9 Scores as seen by post scores and/or verbalization of changes             Quality of Life Scores:  Scores of 19 and below usually indicate a poorer quality of life in these areas.  A difference of  2-3 points is a clinically meaningful difference.  A difference of 2-3 points in the total score of the Quality of Life Index has been associated with significant improvement in overall quality of life, self-image, physical symptoms, and general health in studies assessing change in quality of life.  PHQ-9: Review Flowsheet  More data exists      06/16/2022 05/23/2022 05/19/2022 05/18/2022 02/14/2022  Depression screen PHQ 2/9  Decreased Interest 0 1 0 0 0  Down, Depressed, Hopeless 0 0 0 0 0  PHQ - 2 Score 0 1 0 0 0  Altered sleeping 1 3 - - -  Tired, decreased energy 3 2 - - -  Change in appetite 3 3 - - -  Feeling bad or failure about yourself  0 0 - - -  Trouble concentrating 0 2 - - -  Moving slowly or fidgety/restless 0 0 - - -  Suicidal thoughts 0 0 - - -  PHQ-9 Score 7 11 - - -  Difficult doing work/chores Somewhat difficult Not difficult at all - - -   Interpretation of Total Score  Total Score Depression Severity:  1-4 = Minimal depression, 5-9 = Mild depression, 10-14 = Moderate depression, 15-19 = Moderately severe depression, 20-27 = Severe depression   Psychosocial Evaluation and Intervention:  Psychosocial Evaluation - 05/15/22 1056       Psychosocial Evaluation & Interventions   Interventions Encouraged to exercise with the program and follow exercise prescription;Stress management education;Relaxation education     Comments She took WellButrin for awhile to quit smoking and she did. She has a good family support system.    Expected Outcomes Short: Start [facility] to help with mood. Long: Maintain a healthy mental state.    Continue Psychosocial Services  Follow up required by staff             Psychosocial Re-Evaluation:  Psychosocial Re-Evaluation     [place] Name 06/16/22 1024             Psychosocial Re-Evaluation   Current issues with Current Stress Concerns;Current Psychotropic Meds;Current Anxiety/Panic       Comments Reviewed patient health questionnaire (PHQ-9) with patient for follow up. Previously, patients  score indicated signs/symptoms of depression.  Reviewed to see if patient is improving symptom wise while in program.  Score improved and patient states that it is because she has been able to spend more time with her great gand daughter.       Expected Outcomes Short: Continue to attend [facility] regularly for regular exercise and social engagement. Long: Continue to improve symptoms and manage a positive mental state.       Interventions Encouraged to attend Pulmonary Rehabilitation for the exercise       Continue Psychosocial Services  Follow up required by staff                Psychosocial Discharge (Final Psychosocial Re-Evaluation):  Psychosocial Re-Evaluation - 06/16/22 1024       Psychosocial Re-Evaluation   Current issues with Current Stress Concerns;Current Psychotropic Meds;Current Anxiety/Panic    Comments Reviewed patient health questionnaire (PHQ-9) with patient for follow up. Previously, patients score indicated signs/symptoms of depression.  Reviewed to see if patient is improving symptom wise while in program.  Score improved and patient states that it is because she has been able to spend more time with her great gand daughter.    Expected Outcomes Short: Continue to attend [facility] regularly for regular exercise and social engagement. Long: Continue to improve  symptoms and manage a positive mental state.    Interventions Encouraged to attend Pulmonary Rehabilitation for the exercise    Continue Psychosocial Services  Follow up required by staff             Education: Education Goals: Education classes will be provided on a weekly basis, covering required topics. Participant will state understanding/return demonstration of topics presented.  Learning Barriers/Preferences:  Learning Barriers/Preferences - 05/15/22 1052       Learning Barriers/Preferences   Learning Barriers Hearing    Learning Preferences None             General Pulmonary Education Topics:  Infection Prevention: - Provides verbal and written material to individual with discussion of infection control including proper hand washing and proper equipment cleaning during exercise session. Flowsheet Row Pulmonary Rehab from 06/21/2022 in [facility] Cardiac and Pulmonary Rehab  Date 05/15/22  Educator [facility]  Instruction Review Code 1- Verbalizes Understanding       Falls Prevention: - Provides verbal and written material to individual with discussion of falls prevention and safety. Flowsheet Row Pulmonary Rehab from 06/21/2022 in [facility] Cardiac and Pulmonary Rehab  Date 05/15/22  Educator [place]  Instruction Review Code 1- Verbalizes Understanding       Chronic Lung Disease Review: - Group verbal instruction with posters, models, PowerPoint presentations and videos,  to review new updates, new respiratory medications, new advancements in procedures and treatments. Providing information on websites and "800" numbers for continued self-education. Includes information about supplement oxygen, available portable oxygen systems, continuous and intermittent flow rates, oxygen safety, concentrators, and Medicare reimbursement for oxygen. Explanation of Pulmonary Drugs, including class, frequency, complications, importance of spacers, rinsing mouth after steroid MDI's, and proper  cleaning methods for nebulizers. Review of basic lung anatomy and physiology related to function, structure, and complications of lung disease. Review of risk factors. Discussion about methods for diagnosing sleep apnea and types of masks and machines for OSA. Includes a review of the use of types of environmental controls: home humidity, furnaces, filters, dust mite/pet prevention, HEPA vacuums. Discussion about weather changes, air quality and the benefits of nasal washing. Instruction on Warning signs, infection symptoms, calling  MD promptly, preventive modes, and value of vaccinations. Review of effective airway clearance, coughing and/or vibration techniques. Emphasizing that all should Create an Action Plan. Written material given at graduation. Flowsheet Row Pulmonary Rehab from 06/21/2022 in [facility] Card[facility] and Pulmonary Rehab  Education need identified 05/23/22       AED/CPR: - Group verbal and written instruction with the use of models to demonstrate the basic use of the AED with the basic ABC's of resuscitation.    Anatomy and Cardiac Procedures: - Group verbal and visual presentation and models provide information about basic cardiac anatomy and function. Reviews the testing methods done to diagnose heart disease and the outcomes of the test results. Describes the treatment choices: Medical Management, Angioplasty, or Coronary Bypass Surgery for treating various heart conditions including Myocardial Infarction, Angina, Valve Disease, and Cardiac Arrhythmias.  Written material given at graduation.   Medication Safety: - Group verbal and visual instruction to review commonly prescribed medications for heart and lung disease. Reviews the medication, class of the drug, and side effects. Includes the steps to properly store meds and maintain the prescription regimen.  Written material given at graduation. Flowsheet Row Pulmonary Rehab from 06/21/2022 in [facility] Cardiac and Pulmonary Rehab  Date  05/31/22  Educator SB  Instruction Review Code 1- Verbalizes Understanding       Other: -Provides group and verbal instruction on various topics (see comments)   Knowledge Questionnaire Score:    Core Components/Risk Factors/Patient Goals at Admission:  Personal Goals and Risk Factors at Admission - 05/23/22 1459       Core Components/Risk Factors/Patient Goals on Admission    Weight Management Yes;Weight Loss;Obesity    Intervention Weight Management: Develop a combined nutrition and exercise program designed to reach desired caloric intake, while maintaining appropriate intake of nutrient and fiber, sodium and fats, and appropriate energy expenditure required for the weight goal.;Weight Management: Provide education and appropriate resources to help participant work on and attain dietary goals.;Weight Management/Obesity: Establish reasonable short term and long term weight goals.;Obesity: Provide education and appropriate resources to help participant work on and attain dietary goals.    Admit Weight 186 lb 6.4 oz (84.6 kg)    Goal Weight: Short Term 181 lb (82.1 kg)    Goal Weight: Long Term 175 lb (79.4 kg)    Expected Outcomes Short Term: Continue to assess and modify interventions until short term weight is achieved;Long Term: Adherence to nutrition and physical activity/exercise program aimed toward attainment of established weight goal;Weight Loss: Understanding of general recommendations for a balanced deficit meal plan, which promotes 1-2 lb weight loss per week and includes a negative energy balance of [phone] kcal/d;Understanding recommendations for meals to include 15-35% energy as protein, 25-35% energy from fat, 35-60% energy from carbohydrates, less than 268m of dietary cholesterol, 20-35 gm of total fiber daily;Understanding of distribution of calorie intake throughout the day with the consumption of 4-5 meals/snacks    Tobacco Cessation Yes   Quit May 2023   Number of  packs per day 0    Intervention Assist the participant in steps to quit. Provide individualized education and counseling about committing to Tobacco Cessation, relapse prevention, and pharmacological support that can be provided by physician.;OAdvice worker assist with locating and accessing local/national Quit Smoking programs, and support quit date choice.    Expected Outcomes Short Term: Will demonstrate readiness to quit, by selecting a quit date.;Short Term: Will quit all tobacco product use, adhering to prevention of relapse plan.;Long Term: Complete  abstinence from all tobacco products for at least 12 months from quit date.    Improve shortness of breath with ADL's Yes    Intervention Provide education, individualized exercise plan and daily activity instruction to help decrease symptoms of SOB with activities of daily living.    Expected Outcomes Short Term: Improve cardiorespiratory fitness to achieve a reduction of symptoms when performing ADLs;Long Term: Be able to perform more ADLs without symptoms or delay the onset of symptoms    Diabetes Yes    Intervention Provide education about signs/symptoms and action to take for hypo/hyperglycemia.;Provide education about proper nutrition, including hydration, and aerobic/resistive exercise prescription along with prescribed medications to achieve blood glucose in normal ranges: Fasting glucose 65-99 mg/dL    Expected Outcomes Short Term: Participant verbalizes understanding of the signs/symptoms and immediate care of hyper/hypoglycemia, proper foot care and importance of medication, aerobic/resistive exercise and nutrition plan for blood glucose control.;Long Term: Attainment of HbA1C < 7%.    Heart Failure Yes    Intervention Provide a combined exercise and nutrition program that is supplemented with education, support and counseling about heart failure. Directed toward relieving symptoms such as shortness of breath, decreased  exercise tolerance, and extremity edema.    Expected Outcomes Improve functional capacity of life;Short term: Attendance in program 2-3 days a week with increased exercise capacity. Reported lower sodium intake. Reported increased fruit and vegetable intake. Reports medication compliance.;Short term: Daily weights obtained and reported for increase. Utilizing diuretic protocols set by physician.;Long term: Adoption of self-care skills and reduction of barriers for early signs and symptoms recognition and intervention leading to self-care maintenance.             Education:Diabetes - Individual verbal and written instruction to review signs/symptoms of diabetes, desired ranges of glucose level fasting, after meals and with exercise. Acknowledge that pre and post exercise glucose checks will be done for 3 sessions at entry of program. Flowsheet Row Pulmonary Rehab from 06/21/2022 in [facility] Cardiac and Pulmonary Rehab  Date 05/15/22  Educator [facility]  Instruction Review Code 1- Verbalizes Understanding       Know Your Numbers and Heart Failure: - Group verbal and visual instruction to discuss disease risk factors for cardiac and pulmonary disease and treatment options.  Reviews associated critical values for Overweight/Obesity, Hypertension, Cholesterol, and Diabetes.  Discusses basics of heart failure: signs/symptoms and treatments.  Introduces Heart Failure Zone chart for action plan for heart failure.  Written material given at graduation. Flowsheet Row Pulmonary Rehab from 06/21/2022 in [facility] Cardiac and Pulmonary Rehab  Date 06/07/22  Educator SB  Instruction Review Code 1- Verbalizes Understanding       Core Components/Risk Factors/Patient Goals Review:   Goals and Risk Factor Review     Row Name 06/16/22 1014             Core Components/Risk Factors/Patient Goals Review   Personal Goals Review Improve shortness of breath with ADL's       Review Spoke to patient about their  shortness of breath and what they can do to improve. Patient has been informed of breathing techniques when starting the program. Patient is informed to tell staff if they have had any med changes and that certain meds they are taking or not taking can be causing shortness of breath.       Expected Outcomes Short: Attend [facility] regularly to improve shortness of breath with ADL's. Long: maintain independence with ADL's  Core Components/Risk Factors/Patient Goals at Discharge (Final Review):   Goals and Risk Factor Review - 06/16/22 1014       Core Components/Risk Factors/Patient Goals Review   Personal Goals Review Improve shortness of breath with ADL's    Review Spoke to patient about their shortness of breath and what they can do to improve. Patient has been informed of breathing techniques when starting the program. Patient is informed to tell staff if they have had any med changes and that certain meds they are taking or not taking can be causing shortness of breath.    Expected Outcomes Short: Attend [facility] regularly to improve shortness of breath with ADL's. Long: maintain independence with ADL's             ITP Comments:  ITP Comments     Row Name 05/15/22 1050 05/23/22 1413 05/24/22 1009 05/31/22 0800 06/05/22 1410   ITP Comments Virtual Visit completed. Patient informed on EP and RD appointment and 6 Minute walk test. Patient also informed of patient health questionnaires on My Chart. Patient Verbalizes understanding. Visit diagnosis can be found in [facility] 03/13/22. Completed 6MWT and gym orientation. Initial ITP created and sent for review to Dr. Ottie Glazier, Medical Director. First full day of exercise!  Patient was oriented to gym and equipment including functions, settings, policies, and procedures.  Patient's individual exercise prescription and treatment plan were reviewed.  All starting workloads were established based on the results of the 6 minute walk  test done at initial orientation visit.  The plan for exercise progression was also introduced and progression will be customized based on patient's performance and goals. 30 Day review completed. Medical Director ITP review done, changes made as directed, and signed approval by Medical Director.    New to program Completed Initial RD Consulation    Row Name 06/28/22 1028           ITP Comments 30 Day review completed. Medical Director ITP review done, changes made as directed, and signed approval by Medical Director.                Comments:

## 2022-06-28 NOTE — Progress Notes (Signed)
Daily Session Note  Patient Details  Name: Amber Holder MRN: [id number] Date of Birth: 1946/07/02 Referring Provider:   Flowsheet Row Pulmonary Rehab from 05/23/2022 in [facility] Cardiac and Pulmonary Rehab  Referring Provider Clayborn Bigness MD       Encounter Date: 06/28/2022  Check In:  Session Check In - 06/28/22 1109       Check-In   Supervising physician immediately available to respond to emergencies See telemetry face sheet for immediately available ER MD    Location [facility]-Cardiac & Pulmonary Rehab    Staff Present Antionette Fairy, BS, Exercise Physiologist;Joseph Rosebud Poles, RN, ADN;Meredith Sherryll Burger, RN BSN    Virtual Visit No    Medication changes reported     No    Fall or balance concerns reported    No    Warm-up and Cool-down Performed on first and last piece of equipment    Resistance Training Performed Yes    VAD Patient? No    PAD/SET Patient? No      Pain Assessment   Currently in Pain? No/denies                Social History   Tobacco Use  Smoking Status Former   Packs/day: 1.00   Years: 50.00   Total pack years: 50.00   Types: Cigarettes   Quit date: 12/28/2021   Years since quitting: 0.4   Passive exposure: Never  Smokeless Tobacco Never  Tobacco Comments   1 pack daily//quit 12/28/2021    Goals Met:  Independence with exercise equipment Exercise tolerated well No report of concerns or symptoms today Strength training completed today  Goals Unmet:  Not Applicable  Comments: Pt able to follow exercise prescription today without complaint.  Will continue to monitor for progression.    Dr. Emily Filbert is Medical Director for [place].  Dr. Ottie Glazier is Medical Director for [facility] Pulmonary Rehabilitation.

## 2022-06-30 ENCOUNTER — Encounter: Payer: Medicare Other | Admitting: *Deleted

## 2022-06-30 DIAGNOSIS — J449 Chronic obstructive pulmonary disease, unspecified: Secondary | ICD-10-CM

## 2022-06-30 DIAGNOSIS — J9611 Chronic respiratory failure with hypoxia: Secondary | ICD-10-CM | POA: Diagnosis not present

## 2022-06-30 NOTE — Progress Notes (Signed)
Daily Session Note  Patient Details  Name: Roselin Wiemann MRN: [id number] Date of Birth: [birth year] Referring Provider:   Flowsheet Row Pulmonary Rehab from 05/23/2022 in [facility] Cardiac and Pulmonary Rehab  Referring Provider Clayborn Bigness MD       Encounter Date: 06/30/2022  Check In:  Session Check In - 06/30/22 1013       Check-In   Supervising physician immediately available to respond to emergencies See telemetry face sheet for immediately available ER MD    Location [facility]-Cardiac & Pulmonary Rehab    Staff Present Heath Lark, RN, BSN, CCRP;Jessica Davenport, MA, RCEP, CCRP, CCET;Joseph [place], [place]    Virtual Visit No    Medication changes reported     No    Fall or balance concerns reported    No    Warm-up and Cool-down Performed on first and last piece of equipment    Resistance Training Performed Yes    VAD Patient? No    PAD/SET Patient? No      Pain Assessment   Currently in Pain? No/denies                Social History   Tobacco Use  Smoking Status Former   Packs/day: 1.00   Years: 50.00   Total pack years: 50.00   Types: Cigarettes   Quit date: 12/28/2021   Years since quitting: 0.5   Passive exposure: Never  Smokeless Tobacco Never  Tobacco Comments   1 pack daily//quit 12/28/2021    Goals Met:  Proper associated with RPD/PD & O2 Sat Independence with exercise equipment Exercise tolerated well No report of concerns or symptoms today  Goals Unmet:  Not Applicable  Comments: Pt able to follow exercise prescription today without complaint.  Will continue to monitor for progression.    Dr. Emily Filbert is Medical Director for [place].  Dr. Ottie Glazier is Medical Director for [facility] Pulmonary Rehabilitation.

## 2022-07-03 ENCOUNTER — Encounter: Payer: Medicare Other | Admitting: *Deleted

## 2022-07-03 DIAGNOSIS — J449 Chronic obstructive pulmonary disease, unspecified: Secondary | ICD-10-CM | POA: Diagnosis not present

## 2022-07-03 DIAGNOSIS — J9611 Chronic respiratory failure with hypoxia: Secondary | ICD-10-CM | POA: Diagnosis not present

## 2022-07-03 NOTE — Progress Notes (Signed)
Daily Session Note  Patient Details  Name: Amber Holder MRN: [id number] Date of Birth: Apr 18, 1946 Referring Provider:   Flowsheet Row Pulmonary Rehab from 05/23/2022 in [facility] Cardiac and Pulmonary Rehab  Referring Provider Clayborn Bigness MD       Encounter Date: 07/03/2022  Check In:  Session Check In - 07/03/22 0948       Check-In   Supervising physician immediately available to respond to emergencies See telemetry face sheet for immediately available ER MD    Location [facility]-Cardiac & Pulmonary Rehab    Staff Present Antionette Fairy, BS, Exercise Physiologist;Kelly Amedeo Plenty, BS, ACSM CEP, Exercise Physiologist;Mace Weinberg Tamala Julian, RN, ADN    Virtual Visit No    Medication changes reported     No    Fall or balance concerns reported    No    Warm-up and Cool-down Performed on first and last piece of equipment    Resistance Training Performed Yes    VAD Patient? No    PAD/SET Patient? No      Pain Assessment   Currently in Pain? No/denies                Social History   Tobacco Use  Smoking Status Former   Packs/day: 1.00   Years: 50.00   Total pack years: 50.00   Types: Cigarettes   Quit date: 12/28/2021   Years since quitting: 0.5   Passive exposure: Never  Smokeless Tobacco Never  Tobacco Comments   1 pack daily//quit 12/28/2021    Goals Met:  Independence with exercise equipment Exercise tolerated well No report of concerns or symptoms today Strength training completed today  Goals Unmet:  Not Applicable  Comments: Pt able to follow exercise prescription today without complaint.  Will continue to monitor for progression.    Dr. Emily Filbert is Medical Director for [place].  Dr. Ottie Glazier is Medical Director for [facility] Pulmonary Rehabilitation.

## 2022-07-05 ENCOUNTER — Encounter: Payer: Medicare Other | Admitting: *Deleted

## 2022-07-05 DIAGNOSIS — J449 Chronic obstructive pulmonary disease, unspecified: Secondary | ICD-10-CM

## 2022-07-05 DIAGNOSIS — J9611 Chronic respiratory failure with hypoxia: Secondary | ICD-10-CM | POA: Diagnosis not present

## 2022-07-05 NOTE — Progress Notes (Signed)
Daily Session Note  Patient Details  Name: Afsana Liera MRN: [id number] Date of Birth: Jun 27, 1946 Referring Provider:   Flowsheet Row Pulmonary Rehab from 05/23/2022 in [facility] Cardiac and Pulmonary Rehab  Referring Provider Clayborn Bigness MD       Encounter Date: 07/05/2022  Check In:  Session Check In - 07/05/22 1110       Check-In   Supervising physician immediately available to respond to emergencies See telemetry face sheet for immediately available ER MD    Location [facility]-Cardiac & Pulmonary Rehab    Staff Present Justin Mend, RCP,RRT,BSRT;Noah Tickle, BS, Exercise Physiologist;Bless Lisenby Sherryll Burger, RN Odelia Gage, RN, ADN    Virtual Visit No    Medication changes reported     No    Fall or balance concerns reported    No    Warm-up and Cool-down Performed on first and last piece of equipment    Resistance Training Performed Yes    VAD Patient? No    PAD/SET Patient? No      Pain Assessment   Currently in Pain? No/denies                Social History   Tobacco Use  Smoking Status Former   Packs/day: 1.00   Years: 50.00   Total pack years: 50.00   Types: Cigarettes   Quit date: 12/28/2021   Years since quitting: 0.5   Passive exposure: Never  Smokeless Tobacco Never  Tobacco Comments   1 pack daily//quit 12/28/2021    Goals Met:  Independence with exercise equipment Exercise tolerated well No report of concerns or symptoms today Strength training completed today  Goals Unmet:  Not Applicable  Comments: Pt able to follow exercise prescription today without complaint.  Will continue to monitor for progression.    Dr. Emily Filbert is Medical Director for [place].  Dr. Ottie Glazier is Medical Director for [facility] Pulmonary Rehabilitation.

## 2022-07-07 ENCOUNTER — Encounter: Payer: Self-pay | Admitting: Nurse Practitioner

## 2022-07-07 ENCOUNTER — Encounter: Payer: Medicare Other | Admitting: *Deleted

## 2022-07-07 ENCOUNTER — Other Ambulatory Visit
Admission: RE | Admit: 2022-07-07 | Discharge: 2022-07-07 | Disposition: A | Discharge status: Home / Self Care | Payer: Medicare Other | Attending: Internal Medicine | Admitting: Internal Medicine

## 2022-07-07 DIAGNOSIS — E7800 Pure hypercholesterolemia, unspecified: Secondary | ICD-10-CM | POA: Insufficient documentation

## 2022-07-07 DIAGNOSIS — J449 Chronic obstructive pulmonary disease, unspecified: Secondary | ICD-10-CM

## 2022-07-07 DIAGNOSIS — Z87891 Personal history of nicotine dependence: Secondary | ICD-10-CM | POA: Diagnosis not present

## 2022-07-07 LAB — LIPID PANEL
Cholesterol: 172 mg/dL (ref 0–200)
HDL: 45 mg/dL (ref >40)
LDL Cholesterol: 106 mg/dL — ABNORMAL HIGH (ref 0–99)
Total CHOL/HDL Ratio: 3.8 RATIO
Triglycerides: 104 mg/dL (ref <150)
VLDL: 21 mg/dL (ref 0–40)

## 2022-07-07 NOTE — Progress Notes (Signed)
Daily Session Note  Patient Details  Name: Amber Holder MRN: [id number] Date of Birth: 1946/06/23 Referring Provider:   Flowsheet Row Pulmonary Rehab from 05/23/2022 in [facility] Cardiac and Pulmonary Rehab  Referring Provider Clayborn Bigness MD       Encounter Date: 07/07/2022  Check In:  Session Check In - 07/07/22 1045       Check-In   Supervising physician immediately available to respond to emergencies See telemetry face sheet for immediately available ER MD    Location [facility]-Cardiac & Pulmonary Rehab    Staff Present Heath Lark, RN, BSN, CCRP;Joseph Stockett, RCP,RRT,BSRT;Jessica [place], [place], RCEP, CCRP, CCET    Virtual Visit No    Medication changes reported     No    Fall or balance concerns reported    No    Warm-up and Cool-down Performed on first and last piece of equipment    Resistance Training Performed Yes    VAD Patient? No    PAD/SET Patient? No      Pain Assessment   Currently in Pain? No/denies                Social History   Tobacco Use  Smoking Status Former   Packs/day: 1.00   Years: 50.00   Total pack years: 50.00   Types: Cigarettes   Quit date: 12/28/2021   Years since quitting: 0.5   Passive exposure: Never  Smokeless Tobacco Never  Tobacco Comments   1 pack daily//quit 12/28/2021    Goals Met:  Proper associated with RPD/PD & O2 Sat Independence with exercise equipment Exercise tolerated well No report of concerns or symptoms today  Goals Unmet:  Not Applicable  Comments: Pt able to follow exercise prescription today without complaint.  Will continue to monitor for progression.    Dr. Emily Filbert is Medical Director for [place].  Dr. Ottie Glazier is Medical Director for [facility] Pulmonary Rehabilitation.

## 2022-07-10 ENCOUNTER — Encounter: Payer: Medicare Other | Admitting: *Deleted

## 2022-07-10 DIAGNOSIS — J9611 Chronic respiratory failure with hypoxia: Secondary | ICD-10-CM | POA: Diagnosis not present

## 2022-07-10 DIAGNOSIS — J449 Chronic obstructive pulmonary disease, unspecified: Secondary | ICD-10-CM

## 2022-07-10 NOTE — Progress Notes (Signed)
Daily Session Note  Patient Details  Name: Amber Holder MRN: [id number] Date of Birth: 09/01/1945 Referring Provider:   Flowsheet Row Pulmonary Rehab from 05/23/2022 in [facility] Cardiac and Pulmonary Rehab  Referring Provider Clayborn Bigness MD       Encounter Date: 07/10/2022  Check In:  Session Check In - 07/10/22 0959       Check-In   Supervising physician immediately available to respond to emergencies See telemetry face sheet for immediately available ER MD    Location [facility]-Cardiac & Pulmonary Rehab    Staff Present Darlyne Russian, RN, Doyce Para, BS, ACSM CEP, Exercise Physiologist;Noah Tickle, BS, Exercise Physiologist    Virtual Visit No    Medication changes reported     No    Fall or balance concerns reported    No    Warm-up and Cool-down Performed on first and last piece of equipment    Resistance Training Performed Yes    VAD Patient? No    PAD/SET Patient? No      Pain Assessment   Currently in Pain? No/denies                Social History   Tobacco Use  Smoking Status Former   Packs/day: 1.00   Years: 50.00   Total pack years: 50.00   Types: Cigarettes   Quit date: 12/28/2021   Years since quitting: 0.5   Passive exposure: Never  Smokeless Tobacco Never  Tobacco Comments   1 pack daily//quit 12/28/2021    Goals Met:  Independence with exercise equipment Exercise tolerated well No report of concerns or symptoms today Strength training completed today  Goals Unmet:  Not Applicable  Comments: Pt able to follow exercise prescription today without complaint.  Will continue to monitor for progression.    Dr. Emily Filbert is Medical Director for [place].  Dr. Ottie Glazier is Medical Director for [facility] Pulmonary Rehabilitation.

## 2022-07-11 ENCOUNTER — Ambulatory Visit (INDEPENDENT_AMBULATORY_CARE_PROVIDER_SITE_OTHER): Payer: Medicare Other | Admitting: Internal Medicine

## 2022-07-11 ENCOUNTER — Encounter: Payer: Self-pay | Admitting: Internal Medicine

## 2022-07-11 VITALS — BP 120/70 | HR 66 | Temp 98.0°F | Resp 16 | Ht 65.0 in | Wt 190.0 lb

## 2022-07-11 DIAGNOSIS — I5032 Chronic diastolic (congestive) heart failure: Secondary | ICD-10-CM | POA: Diagnosis not present

## 2022-07-11 DIAGNOSIS — N390 Urinary tract infection, site not specified: Secondary | ICD-10-CM

## 2022-07-11 DIAGNOSIS — E119 Type 2 diabetes mellitus without complications: Secondary | ICD-10-CM | POA: Diagnosis not present

## 2022-07-11 DIAGNOSIS — J9611 Chronic respiratory failure with hypoxia: Secondary | ICD-10-CM | POA: Diagnosis not present

## 2022-07-11 DIAGNOSIS — E876 Hypokalemia: Secondary | ICD-10-CM | POA: Diagnosis not present

## 2022-07-11 DIAGNOSIS — F411 Generalized anxiety disorder: Secondary | ICD-10-CM

## 2022-07-11 DIAGNOSIS — R300 Dysuria: Secondary | ICD-10-CM

## 2022-07-11 LAB — POCT URINALYSIS DIPSTICK
Bilirubin, UA: 1.01
Glucose, UA: NEGATIVE
Ketones, UA: NEGATIVE
Nitrite, UA: NEGATIVE
Protein, UA: NEGATIVE
Spec Grav, UA: 1.01 (ref 1.010–1.025)
Urobilinogen, UA: 0.2 E.U./dL
pH, UA: 6 (ref 5.0–8.0)

## 2022-07-11 NOTE — Progress Notes (Signed)
[facility] [place], [place] [address]  Internal MEDICINE  Office Visit Note  Patient Name: Amber Holder  [id number]  [id number]  Date of Service: 07/24/2022  Chief Complaint  Patient presents with   Follow-up   Diabetes   Gastroesophageal Reflux    HPI Pt is here for routine follow up  She feels well continues to cardiopulmonary rehab, feels better Cardiac MRI revealed no abnormalities Complains about urinary frequency and burning did have a UTI in the past Chronic respiratory failure with hypoxia as result of COPD chronic congestive heart failure diastolic dysfunction on diuretics Diabetic control is improving as well Patient does struggle with her eating habits and would like to go back on Wellbutrin Smoking abstinence is going well    Current Medication: Outpatient Encounter Medications as of 07/11/2022  Medication Sig   acetaminophen (TYLENOL) 500 MG chewable tablet Chew 1,000 mg by mouth every 8 (eight) hours as needed for pain.   albuterol (VENTOLIN HFA) 108 (90 Base) MCG/ACT inhaler Inhale 2 puffs into the lungs every 6 (six) hours as needed for wheezing or shortness of breath.   ALPRAZolam (XANAX) 0.25 MG tablet Take one tab po bid for anxiety prn   amoxicillin-clavulanate (AUGMENTIN) 875-125 MG tablet Take 1 tablet by mouth 2 (two) times daily.   aspirin EC 81 MG tablet Take 1 tablet (81 mg total) by mouth daily. Swallow whole.   atorvastatin (LIPITOR) 20 MG tablet Take 1 tablet (20 mg total) by mouth daily.   atorvastatin (LIPITOR) 40 MG tablet Take 40 mg by mouth daily.   cholecalciferol (VITAMIN D) 400 UNITS TABS tablet Take 2,000 Units by mouth daily.   CRANBERRY PO Take 25,000 mg by mouth daily.   dapagliflozin propanediol (FARXIGA) 10 MG TABS tablet Take 1 tablet (10 mg total) by mouth daily before breakfast.   diphenhydrAMINE (BENADRYL) 50 MG tablet Take one tab 13 hrs, 7 hrs and 1 hr before procedure along with prednisone, may  take one extra if experienced itching or hives   ergocalciferol (DRISDOL) 1.25 MG (50000 UT) capsule Take one cap q week   fluconazole (DIFLUCAN) 150 MG tablet Take one tab po q week for fungal infection   fluticasone (FLOVENT HFA) 110 MCG/ACT inhaler Inhale 2 puffs into the lungs 2 (two) times daily as needed.   glucose blood (ONETOUCH VERIO) test strip BLOOD SUGAR TESTING THREE TIMES DAILY . DX E11.65   hydrocortisone cream 0.5 % Apply topically as needed.    hydrocortisone cream 0.5 % Apply topically.   ibandronate (BONIVA) 150 MG tablet Take 1 tablet (150 mg total) by mouth every 30 (thirty) days.   Lancets (ONETOUCH DELICA PLUS YJEHUD14H) MISC Use  as directed twice a daily DX E11.65   Magnesium 250 MG TABS Take by mouth. Takes 1 tablet 2-3 times per week   metolazone (ZAROXOLYN) 5 MG tablet Take 0.5 tablets (2.5 mg total) by mouth daily. Take 30 mins prior to your Torsemide.   montelukast (SINGULAIR) 10 MG tablet Take 10 mg by mouth daily as needed.   Multiple Vitamins-Minerals (MULTIVITAMIN ADULTS PO) Take 1 tablet by mouth daily.   omeprazole (PRILOSEC) 40 MG capsule Take 1 capsule (40 mg total) by mouth daily.   OXYGEN Inhale 3 L into the lungs. Pt uses [facility] for Oxygen   potassium chloride SA (KLOR-CON M) 20 MEQ tablet TAKE 3 TABS (60 MEQ) IN AM AND 2 TABS (40 MEQ) IN THE PM.   predniSONE & diphenhydrAMINE (CONTRAST  ALLERGY PREMED PACK) 3 x 50 MG & 1 x 50 MG KIT Take 50 mg by mouth Once PRN for up to 1 dose (TAKE 13 hour prior to CT angio).   predniSONE (DELTASONE) 50 MG tablet Take one tab 13 hrs, 7 hrs and 1 hr before scheduled time along with Benadryl, may take one extra after the procedure if experienced itching and hives   Simethicone 80 MG TABS Take 1 tablet (80 mg total) by mouth as needed (burping). As directed.   Tiotropium Bromide-Olodaterol 2.5-2.5 MCG/ACT AERS Inhale 2 puffs into the lungs daily.   torsemide (DEMADEX) 20 MG tablet Take 1 tablet (20 mg total) by  mouth daily.   No facility-administered encounter medications on file as of 07/11/2022.    Surgical History: Past Surgical History:  Procedure Laterality Date   APPENDECTOMY     cataract surgery Bilateral    CHOLECYSTECTOMY     COLONOSCOPY     COLONOSCOPY WITH PROPOFOL N/A 05/28/2015   Procedure: COLONOSCOPY WITH PROPOFOL;  Surgeon: Lollie Sails, MD;  Location: [facility] ENDOSCOPY;  Service: Endoscopy;  Laterality: N/A;   COLONOSCOPY WITH PROPOFOL N/A 10/28/2018   Procedure: COLONOSCOPY WITH PROPOFOL;  Surgeon: Lollie Sails, MD;  Location: [facility] ENDOSCOPY;  Service: Endoscopy;  Laterality: N/A;   EYE SURGERY Bilateral 2015   Cataract Extraction with IOL   LITHOTRIPSY  2015   has had many stones   Mastoidotomy  1970   ROTATOR CUFF REPAIR Right 2011   TOTAL HIP ARTHROPLASTY Right 06/14/2016   Procedure: TOTAL HIP ARTHROPLASTY;  Surgeon: Dereck Leep, MD;  Location: [facility] ORS;  Service: Orthopedics;  Laterality: Right;    Medical History: Past Medical History:  Diagnosis Date   Anxiety 01/02/2012   Arthritis    Asthma    Blockage of coronary artery of heart (HCC)    Chronic kidney disease    Kidney stones   Colon polyps    COPD (chronic obstructive pulmonary disease) (HCC)    Diabetes mellitus without complication (HCC)    GERD (gastroesophageal reflux disease)    HOH (hard of hearing)    Bilateral hearing aids   Kidney stone    Osteoporosis    Sleep apnea    Does not use C-PAP on a regular basis   Venous stasis     Family History: Family History  Problem Relation Age of Onset   Breast cancer Maternal Aunt 64   Bladder Cancer Neg Hx    Kidney cancer Neg Hx     Social History   Socioeconomic History   Marital status: Divorced    Spouse name: Not on file   Number of children: Not on file   Years of education: Not on file   Highest education level: Not on file  Occupational History   Not on file  Tobacco Use   Smoking status: Former    Packs/day: 1.00     Years: 50.00    Total pack years: 50.00    Types: Cigarettes    Quit date: 12/28/2021    Years since quitting: 0.5    Passive exposure: Never   Smokeless tobacco: Never   Tobacco comments:    1 pack daily//quit 12/28/2021  Vaping Use   Vaping Use: Never used  Substance and Sexual Activity   Alcohol use: Not Currently    Comment: very rarely    Drug use: No   Sexual activity: Not Currently  Other Topics Concern   Not on file  Social History  Narrative   Not on file   Social Determinants of Health   Financial Resource Strain: Not on file  Food Insecurity: Not on file  Transportation Needs: Not on file  Physical Activity: Not on file  Stress: Not on file  Social Connections: Not on file  Intimate Partner Violence: Not on file      Review of Systems  Constitutional:  Negative for chills, fatigue and unexpected weight change.  HENT:  Negative for congestion, postnasal drip, rhinorrhea, sneezing and sore throat.   Eyes:  Negative for redness.  Respiratory:  Positive for shortness of breath. Negative for cough and chest tightness.   Cardiovascular:  Negative for chest pain and palpitations.  Gastrointestinal:  Negative for abdominal pain, constipation, diarrhea, nausea and vomiting.  Genitourinary:  Negative for dysuria and frequency.  Musculoskeletal:  Negative for arthralgias, back pain, joint swelling and neck pain.  Skin:  Negative for rash.  Neurological: Negative.  Negative for tremors and numbness.  Hematological:  Negative for adenopathy. Does not bruise/bleed easily.  Psychiatric/Behavioral:  Negative for behavioral problems (Depression), sleep disturbance and suicidal ideas. The patient is not nervous/anxious.     Vital Signs: BP 120/70   Pulse 66   Temp 98 F (36.7 C)   Resp 16   Ht _0  (1.651 m)   Wt 190 lb (86.2 kg)   SpO2 94% Comment: 3 litre  BMI 31.62 kg/m    Physical Exam Constitutional:      Appearance: Normal appearance.  HENT:      Head: Normocephalic and atraumatic.     Nose: Nose normal.     Mouth/Throat:     Mouth: Mucous membranes are moist.     Pharynx: No posterior oropharyngeal erythema.  Eyes:     Extraocular Movements: Extraocular movements intact.     Pupils: Pupils are equal, round, and reactive to light.  Cardiovascular:     Pulses: Normal pulses.     Heart sounds: Normal heart sounds.  Pulmonary:     Effort: Pulmonary effort is normal.     Breath sounds: Normal breath sounds.  Musculoskeletal:        General: No swelling or deformity.     Right lower leg: Edema present.     Left lower leg: Edema present.     Right foot: Normal range of motion.     Left foot: Normal range of motion.  Feet:     Right foot:     Protective Sensation: 2 sites tested.  2 sites sensed.     Skin integrity: Skin integrity normal.     Toenail Condition: Right toenails are normal.     Left foot:     Protective Sensation: 2 sites tested.  2 sites sensed.     Skin integrity: Skin integrity normal.     Toenail Condition: Left toenails are normal.  Neurological:     General: No focal deficit present.     Mental Status: She is alert.  Psychiatric:        Mood and Affect: Mood normal.        Behavior: Behavior normal.      Assessment/Plan: 1. Urinary tract infection without hematuria, site unspecified For cultures and sensitivities patient has been started on Levaquin 500 milligram once a day for the time.  This time - POCT Urinalysis Dipstick - CULTURE, URINE COMPREHENSIVE  2. Chronic respiratory failure with hypoxia (HCC) Continue oxygen continue with physical therapy - Basic Metabolic Panel (BMET)  3. Chronic diastolic  congestive heart failure (HCC) Continue on diuretics and potassium supplement as before  4. GAD (generalized anxiety disorder) Restart Wellbutrin  5. Hypokalemia Continue to monitor potassium  6. Type 2 diabetes mellitus without complication, without long-term current use of insulin  (HCC) Improved glucose we will continue to monitor stressed about diabetic diet as patient struggles with adherence  General Counseling: Taniesha verbalizes understanding of the findings of todays visit and agrees with plan of treatment. I have discussed any further diagnostic evaluation that may be needed or ordered today. We also reviewed her medications today. she has been encouraged to call the office with any questions or concerns that should arise related to todays visit.    Orders Placed This Encounter  Procedures   CULTURE, URINE COMPREHENSIVE   Basic Metabolic Panel (BMET)   POCT Urinalysis Dipstick    No orders of the defined types were placed in this encounter.   Total time spent:35 Minutes Time spent includes review of chart, medications, test results, and follow up plan with the patient.   [place] Controlled Substance Database was reviewed by me.   Dr Lavera Guise Internal medicine

## 2022-07-12 ENCOUNTER — Other Ambulatory Visit
Admission: RE | Admit: 2022-07-12 | Discharge: 2022-07-12 | Disposition: A | Discharge status: Home / Self Care | Payer: Medicare Other | Source: Ambulatory Visit | Attending: Internal Medicine | Admitting: Internal Medicine

## 2022-07-12 ENCOUNTER — Encounter: Payer: Medicare Other | Admitting: *Deleted

## 2022-07-12 DIAGNOSIS — J9611 Chronic respiratory failure with hypoxia: Secondary | ICD-10-CM | POA: Insufficient documentation

## 2022-07-12 DIAGNOSIS — F411 Generalized anxiety disorder: Secondary | ICD-10-CM | POA: Insufficient documentation

## 2022-07-12 DIAGNOSIS — I5032 Chronic diastolic (congestive) heart failure: Secondary | ICD-10-CM | POA: Insufficient documentation

## 2022-07-12 DIAGNOSIS — E1165 Type 2 diabetes mellitus with hyperglycemia: Secondary | ICD-10-CM | POA: Insufficient documentation

## 2022-07-12 DIAGNOSIS — J449 Chronic obstructive pulmonary disease, unspecified: Secondary | ICD-10-CM

## 2022-07-12 LAB — BASIC METABOLIC PANEL
Anion gap: 11 (ref 5–15)
BUN: 21 mg/dL (ref 8–23)
CO2: 26 mmol/L (ref 22–32)
Calcium: 9.6 mg/dL (ref 8.9–10.3)
Chloride: 103 mmol/L (ref 98–111)
Creatinine, Ser: 0.88 mg/dL (ref 0.44–1.00)
GFR, Estimated: >60 mL/min (ref >60)
Glucose, Bld: 117 mg/dL — ABNORMAL HIGH (ref 70–99)
Potassium: 4 mmol/L (ref 3.5–5.1)
Sodium: 140 mmol/L (ref 135–145)

## 2022-07-12 NOTE — Progress Notes (Signed)
Daily Session Note  Patient Details  Name: Amber Holder MRN: [id number] Date of Birth: 05/08/1946 Referring Provider:   Flowsheet Row Pulmonary Rehab from 05/23/2022 in [facility] Cardiac and Pulmonary Rehab  Referring Provider Clayborn Bigness MD       Encounter Date: 07/12/2022  Check In:  Session Check In - 07/12/22 1007       Check-In   Supervising physician immediately available to respond to emergencies See telemetry face sheet for immediately available ER MD    Location [facility]-Cardiac & Pulmonary Rehab    Staff Present Antionette Fairy, BS, Exercise Physiologist;Joseph Rosebud Poles, RN, [place]    Virtual Visit No    Medication changes reported     No    Fall or balance concerns reported    No    Warm-up and Cool-down Performed on first and last piece of equipment    Resistance Training Performed Yes    VAD Patient? No    PAD/SET Patient? No      Pain Assessment   Currently in Pain? No/denies                Social History   Tobacco Use  Smoking Status Former   Packs/day: 1.00   Years: 50.00   Total pack years: 50.00   Types: Cigarettes   Quit date: 12/28/2021   Years since quitting: 0.5   Passive exposure: Never  Smokeless Tobacco Never  Tobacco Comments   1 pack daily//quit 12/28/2021    Goals Met:  Independence with exercise equipment Exercise tolerated well No report of concerns or symptoms today Strength training completed today  Goals Unmet:  Not Applicable  Comments: Pt able to follow exercise prescription today without complaint.  Will continue to monitor for progression.    Dr. Emily Filbert is Medical Director for [place].  Dr. Ottie Glazier is Medical Director for [facility] Pulmonary Rehabilitation.

## 2022-07-13 NOTE — Progress Notes (Signed)
Continue all medications as before, labs are within limits, will need to have labs done before next visit, order sent

## 2022-07-13 NOTE — Progress Notes (Signed)
Please advise pt, reduce potassium to one tab bid for now and see if that helps

## 2022-07-14 ENCOUNTER — Encounter: Payer: Medicare Other | Admitting: *Deleted

## 2022-07-14 DIAGNOSIS — J9611 Chronic respiratory failure with hypoxia: Secondary | ICD-10-CM | POA: Diagnosis not present

## 2022-07-14 DIAGNOSIS — J449 Chronic obstructive pulmonary disease, unspecified: Secondary | ICD-10-CM

## 2022-07-14 LAB — CULTURE, URINE COMPREHENSIVE

## 2022-07-14 NOTE — Progress Notes (Signed)
Daily Session Note  Patient Details  Name: Amber Holder MRN: [id number] Date of Birth: [birth year] Referring Provider:   Flowsheet Row Pulmonary Rehab from 05/23/2022 in [facility] Cardiac and Pulmonary Rehab  Referring Provider Clayborn Bigness MD       Encounter Date: 07/14/2022  Check In:  Session Check In - 07/14/22 0959       Check-In   Supervising physician immediately available to respond to emergencies See telemetry face sheet for immediately available ER MD    Location [facility]-Cardiac & Pulmonary Rehab    Staff Present Alberteen Sam, MA, RCEP, CCRP, CCET;Joseph Clayton, [place], RN, [place]    Virtual Visit No    Medication changes reported     No    Fall or balance concerns reported    No    Warm-up and Cool-down Performed on first and last piece of equipment    Resistance Training Performed Yes    VAD Patient? No    PAD/SET Patient? No      Pain Assessment   Currently in Pain? No/denies                Social History   Tobacco Use  Smoking Status Former   Packs/day: 1.00   Years: 50.00   Total pack years: 50.00   Types: Cigarettes   Quit date: 12/28/2021   Years since quitting: 0.5   Passive exposure: Never  Smokeless Tobacco Never  Tobacco Comments   1 pack daily//quit 12/28/2021    Goals Met:  Independence with exercise equipment Exercise tolerated well No report of concerns or symptoms today Strength training completed today  Goals Unmet:  Not Applicable  Comments: Pt able to follow exercise prescription today without complaint.  Will continue to monitor for progression.    Dr. Emily Filbert is Medical Director for [place].  Dr. Ottie Glazier is Medical Director for [facility] Pulmonary Rehabilitation.

## 2022-07-17 ENCOUNTER — Other Ambulatory Visit: Payer: Self-pay

## 2022-07-17 ENCOUNTER — Telehealth: Payer: Self-pay

## 2022-07-17 ENCOUNTER — Encounter: Payer: Medicare Other | Admitting: *Deleted

## 2022-07-17 DIAGNOSIS — E875 Hyperkalemia: Secondary | ICD-10-CM

## 2022-07-17 DIAGNOSIS — J449 Chronic obstructive pulmonary disease, unspecified: Secondary | ICD-10-CM

## 2022-07-17 DIAGNOSIS — J9611 Chronic respiratory failure with hypoxia: Secondary | ICD-10-CM | POA: Diagnosis not present

## 2022-07-17 MED ORDER — LEVOFLOXACIN 500 MG PO TABS: 500.0000 mg | ORAL_TABLET | Freq: Every day | ORAL | 0 refills | Status: DC

## 2022-07-17 NOTE — Progress Notes (Signed)
Daily Session Note  Patient Details  Name: Amber Holder MRN: [id number] Date of Birth: 1946/02/18 Referring Provider:   Flowsheet Row Pulmonary Rehab from 05/23/2022 in [facility] Cardiac and Pulmonary Rehab  Referring Provider Clayborn Bigness MD       Encounter Date: 07/17/2022  Check In:  Session Check In - 07/17/22 1000       Check-In   Supervising physician immediately available to respond to emergencies See telemetry face sheet for immediately available ER MD    Location [facility]-Cardiac & Pulmonary Rehab    Staff Present Renita Papa, RN Odelia Gage, RN, Doyce Para, BS, ACSM CEP, Exercise Physiologist;Noah Tickle, BS, Exercise Physiologist    Virtual Visit No    Medication changes reported     No    Fall or balance concerns reported    No    Warm-up and Cool-down Performed on first and last piece of equipment    Resistance Training Performed Yes    VAD Patient? No    PAD/SET Patient? No      Pain Assessment   Currently in Pain? No/denies                Social History   Tobacco Use  Smoking Status Former   Packs/day: 1.00   Years: 50.00   Total pack years: 50.00   Types: Cigarettes   Quit date: 12/28/2021   Years since quitting: 0.5   Passive exposure: Never  Smokeless Tobacco Never  Tobacco Comments   1 pack daily//quit 12/28/2021    Goals Met:  Independence with exercise equipment Exercise tolerated well No report of concerns or symptoms today Strength training completed today  Goals Unmet:  Not Applicable  Comments: Pt able to follow exercise prescription today without complaint.  Will continue to monitor for progression.    Dr. Emily Filbert is Medical Director for [place].  Dr. Ottie Glazier is Medical Director for [facility] Pulmonary Rehabilitation.

## 2022-07-17 NOTE — Progress Notes (Signed)
Call in [place] 500 mg po qd x 10 days for UTI

## 2022-07-17 NOTE — Telephone Encounter (Signed)
-----   Message from Lavera Guise, MD sent at 07/17/2022  9:01 AM EST ----- Call in [place] 500 mg po qd x 10 days for UTI

## 2022-07-17 NOTE — Telephone Encounter (Signed)
Lmom to call us back 

## 2022-07-17 NOTE — Telephone Encounter (Signed)
Pt advised that urine result and also her diarrhea is better as per DFK advised her take potassium BID and repeat lab  Friday or Monday and lab order is the epic

## 2022-07-17 NOTE — Telephone Encounter (Signed)
-----   Message from Lavera Guise, MD sent at 07/17/2022  9:01 AM EST ----- Call in [place] 500 mg po qd x 10 days for UTI

## 2022-07-17 NOTE — Telephone Encounter (Signed)
Lmom to that we send Levaquin for UTI

## 2022-07-19 ENCOUNTER — Encounter: Payer: Medicare Other | Admitting: *Deleted

## 2022-07-19 DIAGNOSIS — J449 Chronic obstructive pulmonary disease, unspecified: Secondary | ICD-10-CM

## 2022-07-19 DIAGNOSIS — J9611 Chronic respiratory failure with hypoxia: Secondary | ICD-10-CM | POA: Diagnosis not present

## 2022-07-19 NOTE — Progress Notes (Signed)
Daily Session Note  Patient Details  Name: Amber Holder MRN: [id number] Date of Birth: 1945/12/16 Referring Provider:   Flowsheet Row Pulmonary Rehab from 05/23/2022 in [facility] Cardiac and Pulmonary Rehab  Referring Provider Clayborn Bigness MD       Encounter Date: 07/19/2022  Check In:  Session Check In - 07/19/22 1016       Check-In   Supervising physician immediately available to respond to emergencies See telemetry face sheet for immediately available ER MD    Location [facility]-Cardiac & Pulmonary Rehab    Staff Present Heath Lark, RN, BSN, CCRP;Shaeleigh Graw Tamala Julian, RN, ADN;Meredith Sherryll Burger, RN Margurite Auerbach, MS, ASCM CEP, Exercise Physiologist    Virtual Visit No    Medication changes reported     No    Fall or balance concerns reported    No    Tobacco Cessation No Change    Warm-up and Cool-down Performed on first and last piece of equipment    Resistance Training Performed Yes    VAD Patient? No    PAD/SET Patient? No      Pain Assessment   Currently in Pain? No/denies                Social History   Tobacco Use  Smoking Status Former   Packs/day: 1.00   Years: 50.00   Total pack years: 50.00   Types: Cigarettes   Quit date: 12/28/2021   Years since quitting: 0.5   Passive exposure: Never  Smokeless Tobacco Never  Tobacco Comments   1 pack daily//quit 12/28/2021    Goals Met:  Independence with exercise equipment Exercise tolerated well No report of concerns or symptoms today Strength training completed today  Goals Unmet:  Not Applicable  Comments: Pt able to follow exercise prescription today without complaint.  Will continue to monitor for progression.    Dr. Emily Filbert is Medical Director for [place].  Dr. Ottie Glazier is Medical Director for [facility] Pulmonary Rehabilitation.

## 2022-07-24 ENCOUNTER — Encounter: Payer: Medicare Other | Admitting: *Deleted

## 2022-07-24 ENCOUNTER — Other Ambulatory Visit
Admission: RE | Admit: 2022-07-24 | Discharge: 2022-07-24 | Disposition: A | Discharge status: Home / Self Care | Payer: Medicare Other | Source: Ambulatory Visit | Attending: Internal Medicine | Admitting: Internal Medicine

## 2022-07-24 DIAGNOSIS — J449 Chronic obstructive pulmonary disease, unspecified: Secondary | ICD-10-CM

## 2022-07-24 DIAGNOSIS — E876 Hypokalemia: Secondary | ICD-10-CM | POA: Insufficient documentation

## 2022-07-24 DIAGNOSIS — J9611 Chronic respiratory failure with hypoxia: Secondary | ICD-10-CM | POA: Diagnosis not present

## 2022-07-24 LAB — BASIC METABOLIC PANEL
Anion gap: 14 (ref 5–15)
BUN: 35 mg/dL — ABNORMAL HIGH (ref 8–23)
CO2: 29 mmol/L (ref 22–32)
Calcium: 9.7 mg/dL (ref 8.9–10.3)
Chloride: 95 mmol/L — ABNORMAL LOW (ref 98–111)
Creatinine, Ser: 1.12 mg/dL — ABNORMAL HIGH (ref 0.44–1.00)
GFR, Estimated: 51 mL/min — ABNORMAL LOW (ref >60)
Glucose, Bld: 149 mg/dL — ABNORMAL HIGH (ref 70–99)
Potassium: 2.9 mmol/L — ABNORMAL LOW (ref 3.5–5.1)
Sodium: 138 mmol/L (ref 135–145)

## 2022-07-24 NOTE — Progress Notes (Signed)
Daily Session Note  Patient Details  Name: Amber Holder MRN: [id number] Date of Birth: 1946/03/11 Referring Provider:   Flowsheet Row Pulmonary Rehab from 05/23/2022 in [facility] Cardiac and Pulmonary Rehab  Referring Provider Amber Bigness MD       Encounter Date: 07/24/2022  Check In:  Session Check In - 07/24/22 1127       Check-In   Supervising physician immediately available to respond to emergencies See telemetry face sheet for immediately available ER MD    Location [facility]-Cardiac & Pulmonary Rehab    Staff Present Amber Holder, BS, Exercise Physiologist;Amber Holder, BS, ACSM CEP, Exercise Physiologist;Amber Lievanos Tamala Julian, RN, ADN    Virtual Visit No    Medication changes reported     No    Fall or balance concerns reported    No    Tobacco Cessation No Change    Warm-up and Cool-down Performed on first and last piece of equipment    Resistance Training Performed Yes    VAD Patient? No    PAD/SET Patient? No      Pain Assessment   Currently in Pain? No/denies                Social History   Tobacco Use  Smoking Status Former   Packs/day: 1.00   Years: 50.00   Total pack years: 50.00   Types: Cigarettes   Quit date: 12/28/2021   Years since quitting: 0.5   Passive exposure: Never  Smokeless Tobacco Never  Tobacco Comments   1 pack daily//quit 12/28/2021    Goals Met:  Independence with exercise equipment Exercise tolerated well No report of concerns or symptoms today Strength training completed today  Goals Unmet:  Not Applicable  Comments: Pt able to follow exercise prescription today without complaint.  Will continue to monitor for progression.    Dr. Emily Holder is Medical Director for [facility].  Dr. Ottie Holder is Medical Director for [facility] Pulmonary Rehabilitation.

## 2022-07-25 ENCOUNTER — Telehealth: Payer: Self-pay

## 2022-07-25 DIAGNOSIS — E876 Hypokalemia: Secondary | ICD-10-CM

## 2022-07-25 DIAGNOSIS — J449 Chronic obstructive pulmonary disease, unspecified: Secondary | ICD-10-CM

## 2022-07-25 NOTE — Progress Notes (Signed)
Completed nutrition f/u appointment

## 2022-07-25 NOTE — Telephone Encounter (Signed)
Pt advised as per dr Humphrey Rolls that her potassium is low 2.9  she can take Potassium tab  2 tab  Twice a day and hold for Metolazone  for 1 week and repeat Bmp next Monday and also pt is still having UTI symptoms make appt this Thursday

## 2022-07-26 ENCOUNTER — Encounter: Payer: Self-pay | Admitting: *Deleted

## 2022-07-26 ENCOUNTER — Encounter: Payer: Medicare Other | Admitting: *Deleted

## 2022-07-26 DIAGNOSIS — J449 Chronic obstructive pulmonary disease, unspecified: Secondary | ICD-10-CM | POA: Diagnosis not present

## 2022-07-26 DIAGNOSIS — J9611 Chronic respiratory failure with hypoxia: Secondary | ICD-10-CM | POA: Diagnosis not present

## 2022-07-26 NOTE — Progress Notes (Signed)
Daily Session Note  Patient Details  Name: Amber Holder MRN: [id number] Date of Birth: [birth year] Referring Provider:   Flowsheet Row Pulmonary Rehab from 05/23/2022 in [facility] Cardiac and Pulmonary Rehab  Referring Provider Clayborn Bigness MD       Encounter Date: 07/26/2022  Check In:  Session Check In - 07/26/22 1106       Check-In   Supervising physician immediately available to respond to emergencies See telemetry face sheet for immediately available ER MD    Location [facility]-Cardiac & Pulmonary Rehab    Staff Present Renita Papa, RN BSN;Jessica Luan Pulling, MA, RCEP, CCRP, CCET;Khayri Kargbo Tamala Julian, RN, Dimple Nanas, BS, Exercise Physiologist    Virtual Visit No    Medication changes reported     No    Fall or balance concerns reported    No    Warm-up and Cool-down Performed on first and last piece of equipment    Resistance Training Performed No    VAD Patient? No    PAD/SET Patient? No      Pain Assessment   Currently in Pain? No/denies                Social History   Tobacco Use  Smoking Status Former   Packs/day: 1.00   Years: 50.00   Total pack years: 50.00   Types: Cigarettes   Quit date: 12/28/2021   Years since quitting: 0.5   Passive exposure: Never  Smokeless Tobacco Never  Tobacco Comments   1 pack daily//quit 12/28/2021    Goals Met:  Independence with exercise equipment Exercise tolerated well No report of concerns or symptoms today Strength training completed today  Goals Unmet:  Not Applicable  Comments: Pt able to follow exercise prescription today without complaint.  Will continue to monitor for progression.    Dr. Emily Filbert is Medical Director for [place].  Dr. Ottie Glazier is Medical Director for [facility] Pulmonary Rehabilitation.

## 2022-07-26 NOTE — Progress Notes (Signed)
Pulmonary Individual Treatment Plan  Patient Details  Name: Amber Holder MRN: [id number] Date of Birth: 02-27-1946 Referring Provider:   Flowsheet Row Pulmonary Rehab from 05/23/2022 in [facility] Cardiac and Pulmonary Rehab  Referring Provider Clayborn Bigness MD       Initial Encounter Date:  Flowsheet Row Pulmonary Rehab from 05/23/2022 in [facility] Cardiac and Pulmonary Rehab  Date 05/23/22       Visit Diagnosis: Chronic obstructive pulmonary disease, unspecified COPD type ([place])  Patient's Home Medications on Admission:  Current Outpatient Medications:    acetaminophen (TYLENOL) 500 MG chewable tablet, Chew 1,000 mg by mouth every 8 (eight) hours as needed for pain., Disp: , Rfl:    albuterol (VENTOLIN HFA) 108 (90 Base) MCG/ACT inhaler, Inhale 2 puffs into the lungs every 6 (six) hours as needed for wheezing or shortness of breath., Disp: 18 g, Rfl: 2   ALPRAZolam (XANAX) 0.25 MG tablet, Take one tab po bid for anxiety prn, Disp: 30 tablet, Rfl: 1   amoxicillin-clavulanate (AUGMENTIN) 875-125 MG tablet, Take 1 tablet by mouth 2 (two) times daily., Disp: 20 tablet, Rfl: 0   aspirin EC 81 MG tablet, Take 1 tablet (81 mg total) by mouth daily. Swallow whole., Disp: 90 tablet, Rfl: 3   atorvastatin (LIPITOR) 20 MG tablet, Take 1 tablet (20 mg total) by mouth daily., Disp: 90 tablet, Rfl: 3   atorvastatin (LIPITOR) 40 MG tablet, Take 40 mg by mouth daily., Disp: , Rfl:    cholecalciferol (VITAMIN D) 400 UNITS TABS tablet, Take 2,000 Units by mouth daily., Disp: , Rfl:    CRANBERRY PO, Take 25,000 mg by mouth daily., Disp: , Rfl:    dapagliflozin propanediol (FARXIGA) 10 MG TABS tablet, Take 1 tablet (10 mg total) by mouth daily before breakfast., Disp: 90 tablet, Rfl: 3   diphenhydrAMINE (BENADRYL) 50 MG tablet, Take one tab 13 hrs, 7 hrs and 1 hr before procedure along with prednisone, may take one extra if experienced itching or hives, Disp: 4 tablet, Rfl: 0   ergocalciferol (DRISDOL) 1.25 MG  (50000 UT) capsule, Take one cap q week, Disp: 12 capsule, Rfl: 3   fluconazole (DIFLUCAN) 150 MG tablet, Take one tab po q week for fungal infection, Disp: 4 tablet, Rfl: 0   fluticasone (FLOVENT HFA) 110 MCG/ACT inhaler, Inhale 2 puffs into the lungs 2 (two) times daily as needed., Disp: , Rfl:    glucose blood (ONETOUCH VERIO) test strip, BLOOD SUGAR TESTING THREE TIMES DAILY . DX E11.65, Disp: 100 strip, Rfl: 3   hydrocortisone cream 0.5 %, Apply topically as needed. , Disp: , Rfl:    hydrocortisone cream 0.5 %, Apply topically., Disp: , Rfl:    ibandronate (BONIVA) 150 MG tablet, Take 1 tablet (150 mg total) by mouth every 30 (thirty) days., Disp: 3 tablet, Rfl: 3   Lancets (ONETOUCH DELICA PLUS [id number]) MISC, Use  as directed twice a daily DX E11.65, Disp: 100 each, Rfl: 3   levofloxacin (LEVAQUIN) 500 MG tablet, Take 1 tablet (500 mg total) by mouth daily., Disp: 10 tablet, Rfl: 0   Magnesium 250 MG TABS, Take by mouth. Takes 1 tablet 2-3 times per week, Disp: , Rfl:    metolazone (ZAROXOLYN) 5 MG tablet, Take 0.5 tablets (2.5 mg total) by mouth daily. Take 30 mins prior to your Torsemide., Disp: 30 tablet, Rfl: 1   montelukast (SINGULAIR) 10 MG tablet, Take 10 mg by mouth daily as needed., Disp: , Rfl:    Multiple Vitamins-Minerals (MULTIVITAMIN ADULTS PO),  Take 1 tablet by mouth daily., Disp: , Rfl:    omeprazole (PRILOSEC) 40 MG capsule, Take 1 capsule (40 mg total) by mouth daily., Disp: , Rfl:    OXYGEN, Inhale 3 L into the lungs. Pt uses [facility] for Oxygen, Disp: , Rfl:    potassium chloride SA (KLOR-CON M) 20 MEQ tablet, TAKE 3 TABS (60 MEQ) IN AM AND 2 TABS (40 MEQ) IN THE PM., Disp: 300 tablet, Rfl: 2   predniSONE & diphenhydrAMINE (CONTRAST ALLERGY PREMED PACK) 3 x 50 MG & 1 x 50 MG KIT, Take 50 mg by mouth Once PRN for up to 1 dose (TAKE 13 hour prior to CT angio)., Disp: 1 kit, Rfl: 0   predniSONE (DELTASONE) 50 MG tablet, Take one tab 13 hrs, 7 hrs and 1 hr before  scheduled time along with Benadryl, may take one extra after the procedure if experienced itching and hives, Disp: 4 tablet, Rfl: 0   Simethicone 80 MG TABS, Take 1 tablet (80 mg total) by mouth as needed (burping). As directed., Disp: , Rfl: 0   Tiotropium Bromide-Olodaterol 2.5-2.5 MCG/ACT AERS, Inhale 2 puffs into the lungs daily., Disp: 1 each, Rfl: 4   torsemide (DEMADEX) 20 MG tablet, Take 1 tablet (20 mg total) by mouth daily., Disp: 30 tablet, Rfl: 1  Past Medical History: Past Medical History:  Diagnosis Date   Anxiety 01/02/2012   Arthritis    Asthma    Blockage of coronary artery of heart (HCC)    Chronic kidney disease    Kidney stones   Colon polyps    COPD (chronic obstructive pulmonary disease) (HCC)    Diabetes mellitus without complication (HCC)    GERD (gastroesophageal reflux disease)    HOH (hard of hearing)    Bilateral hearing aids   Kidney stone    Osteoporosis    Sleep apnea    Does not use C-PAP on a regular basis   Venous stasis     Tobacco Use: Social History   Tobacco Use  Smoking Status Former   Packs/day: 1.00   Years: 50.00   Total pack years: 50.00   Types: Cigarettes   Quit date: 12/28/2021   Years since quitting: 0.5   Passive exposure: Never  Smokeless Tobacco Never  Tobacco Comments   1 pack daily//quit 12/28/2021    Labs: Review Flowsheet  More data exists      Latest Ref Rng & Units 01/10/2022 01/16/2022 04/24/2022 05/19/2022 07/07/2022  Labs for ITP Cardiac and Pulmonary Rehab  Cholestrol 0 - 200 mg/dL - 261  - 162  172   LDL (calc) 0 - 99 mg/dL - 182  - 92  106   HDL-C >40 mg/dL - 52  - 48  45   Trlycerides <150 mg/dL - 134  - 111  104   Hemoglobin A1c 4.0 - 5.6 % 6.5  - 6.2  - -     Pulmonary Assessment Scores:  Pulmonary Assessment Scores     Row Name 05/23/22 1415         ADL UCSD   ADL Phase Entry     SOB Score total 62     Rest 2     Walk 3     Stairs 5     Bath 2     Dress 2     Shop 3       CAT Score    CAT Score 17       mMRC  Score   mMRC Score 2              UCSD: Self-administered rating of dyspnea associated with activities of daily living (ADLs) 6-point scale (0 = "not at all" to 5 = "maximal or unable to do because of breathlessness")  Scoring Scores range from 0 to 120.  Minimally important difference is 5 units  CAT: CAT can identify the health impairment of COPD patients and is better correlated with disease progression.  CAT has a scoring range of zero to 40. The CAT score is classified into four groups of low (less than 10), medium (10 - 20), high (21-30) and very high (31-40) based on the impact level of disease on health status. A CAT score over 10 suggests significant symptoms.  A worsening CAT score could be explained by an exacerbation, poor medication adherence, poor inhaler technique, or progression of COPD or comorbid conditions.  CAT MCID is 2 points  mMRC: mMRC (Modified Medical Research Council) Dyspnea Scale is used to assess the degree of baseline functional disability in patients of respiratory disease due to dyspnea. No minimal important difference is established. A decrease in score of 1 point or greater is considered a positive change.   Pulmonary Function Assessment:  Pulmonary Function Assessment - 05/15/22 1052       Breath   Shortness of Breath Yes;Limiting activity             Exercise Target Goals: Exercise Program Goal: Individual exercise prescription set using results from initial 6 min walk test and THRR while considering  patient's activity barriers and safety.   Exercise Prescription Goal: Initial exercise prescription builds to 30-45 minutes a day of aerobic activity, 2-3 days per week.  Home exercise guidelines will be given to patient during program as part of exercise prescription that the participant will acknowledge.  Education: Aerobic Exercise: - Group verbal and visual presentation on the components of exercise  prescription. Introduces F.I.T.T principle from ACSM for exercise prescriptions.  Reviews F.I.T.T. principles of aerobic exercise including progression. Written material given at graduation. Flowsheet Row Pulmonary Rehab from 07/05/2022 in [facility] Cardiac and Pulmonary Rehab  Date 07/05/22  Educator [facility]  Instruction Review Code 1- Verbalizes Understanding       Education: Resistance Exercise: - Group verbal and visual presentation on the components of exercise prescription. Introduces F.I.T.T principle from ACSM for exercise prescriptions  Reviews F.I.T.T. principles of resistance exercise including progression. Written material given at graduation.    Education: Exercise & Equipment Safety: - Individual verbal instruction and demonstration of equipment use and safety with use of the equipment. Flowsheet Row Pulmonary Rehab from 07/05/2022 in [facility] Cardiac and Pulmonary Rehab  Date 05/15/22  Educator [facility]  Instruction Review Code 1- Verbalizes Understanding       Education: Exercise Physiology & General Exercise Guidelines: - Group verbal and written instruction with models to review the exercise physiology of the cardiovascular system and associated critical values. Provides general exercise guidelines with specific guidelines to those with heart or lung disease.  Flowsheet Row Pulmonary Rehab from 07/05/2022 in [facility] Cardiac and Pulmonary Rehab  Date 06/28/22  Educator [facility]  Instruction Review Code 1- Verbalizes Understanding       Education: Flexibility, Balance, Mind/Body Relaxation: - Group verbal and visual presentation with interactive activity on the components of exercise prescription. Introduces F.I.T.T principle from ACSM for exercise prescriptions. Reviews F.I.T.T. principles of flexibility and balance exercise training including progression. Also discusses the mind body connection.  Reviews various relaxation techniques to help reduce and manage stress (i.e. Deep breathing,  progressive muscle relaxation, and visualization). Balance handout provided to take home. Written material given at graduation.   Activity Barriers & Risk Stratification:  Activity Barriers & Cardiac Risk Stratification - 05/23/22 1452       Activity Barriers & Cardiac Risk Stratification   Activity Barriers Right Hip Replacement;Deconditioning;Muscular Weakness;Shortness of Breath;Decreased Ventricular Function             6 Minute Walk:  6 Minute Walk     Row Name 05/23/22 1450         6 Minute Walk   Phase Initial     Distance 990 feet     Walk Time 6 minutes     # of Rest Breaks 0     MPH 1.87     METS 1.62     RPE 15     Perceived Dyspnea  3     VO2 Peak 5.67     Symptoms Yes (comment)     Comments Right hip tightness, SOB     Resting HR 72 bpm     Resting BP 106/66     Resting Oxygen Saturation  93 %     Exercise Oxygen Saturation  during 6 min walk 87 %     Max Ex. HR 90 bpm     Max Ex. BP 124/62     2 Minute Post BP 104/66       Interval HR   1 Minute HR 83     2 Minute HR 85     3 Minute HR 87     4 Minute HR 88     5 Minute HR 89     6 Minute HR 90     2 Minute Post HR 77     Interval Heart Rate? Yes       Interval Oxygen   Interval Oxygen? Yes     Baseline Oxygen Saturation % 93 %     1 Minute Oxygen Saturation % 92 %     1 Minute Liters of Oxygen 3 L     2 Minute Oxygen Saturation % 88 %     2 Minute Liters of Oxygen 3 L     3 Minute Oxygen Saturation % 87 %     3 Minute Liters of Oxygen 3 L     4 Minute Oxygen Saturation % 88 %     4 Minute Liters of Oxygen 3 L     5 Minute Oxygen Saturation % 87 %     5 Minute Liters of Oxygen 3 L     6 Minute Oxygen Saturation % 87 %     6 Minute Liters of Oxygen 3 L     2 Minute Post Oxygen Saturation % 94 %     2 Minute Post Liters of Oxygen 3 L             Oxygen Initial Assessment:  Oxygen Initial Assessment - 07/17/22 1007       Home Oxygen   Home Oxygen Device Home  Concentrator;E-Tanks;Portable Concentrator    Sleep Oxygen Prescription Continuous    Liters per minute 2.5    Home Exercise Oxygen Prescription Continuous    Liters per minute 3    Liters per minute 2.5    Compliance with Home Oxygen Use Yes      Initial 6 min Walk   Oxygen Used Continuous;Portable Concentrator  Program Oxygen Prescription   Program Oxygen Prescription Continuous    Liters per minute 3      Intervention   Short Term Goals To learn and demonstrate proper use of respiratory medications;To learn and understand importance of monitoring SPO2 with pulse oximeter and demonstrate accurate use of the pulse oximeter.;To learn and exhibit compliance with exercise, home and travel O2 prescription;To learn and understand importance of maintaining oxygen saturations>88%    Long  Term Goals Demonstrates proper use of MDI's;Maintenance of O2 saturations>88%;Compliance with respiratory medication;Exhibits proper breathing techniques, such as pursed lip breathing or other method taught during program session;Verbalizes importance of monitoring SPO2 with pulse oximeter and return demonstration             Oxygen Re-Evaluation:  Oxygen Re-Evaluation     Row Name 05/24/22 1029 06/16/22 1008 07/17/22 1007         Program Oxygen Prescription   Program Oxygen Prescription -- Continuous --     Liters per minute -- 3 --       Home Oxygen   Home Oxygen Device -- Home Concentrator;E-Tanks;Portable Concentrator --     Sleep Oxygen Prescription -- Continuous --     Liters per minute -- 2.5 --     Home Exercise Oxygen Prescription -- Continuous --     Liters per minute -- 3 --     Home Resting Oxygen Prescription -- Continuous --     Liters per minute -- 2 --     Compliance with Home Oxygen Use -- Yes --       Goals/Expected Outcomes   Short Term Goals -- To learn and demonstrate proper use of respiratory medications --     Long  Term Goals -- Demonstrates proper use of MDI's  --     Comments Reviewed PLB technique with pt.  Talked about how it works and it's importance in maintaining their exercise saturations. -- Amber Holder is doing well and watches her O2 consistently. She usually eanges 94-95% at rest and typically 91-92% when she is active. She forgets to PLB sometimes but has been trying to get better at getting in the habit of doing it. She is in the process of finding a new Pulmonologist.     Goals/Expected Outcomes Short: Become more profiecient at using PLB.   Long: Become independent at using PLB. -- Short: Keep practicing PLB Long: Become independent at using PLB              Oxygen Discharge (Final Oxygen Re-Evaluation):  Oxygen Re-Evaluation - 07/17/22 1007       Goals/Expected Outcomes   Comments Amber Holder is doing well and watches her O2 consistently. She usually eanges 94-95% at rest and typically 91-92% when she is active. She forgets to PLB sometimes but has been trying to get better at getting in the habit of doing it. She is in the process of finding a new Pulmonologist.    Goals/Expected Outcomes Short: Keep practicing PLB Long: Become independent at using PLB             Initial Exercise Prescription:  Initial Exercise Prescription - 05/23/22 1400       Date of Initial Exercise RX and Referring Provider   Date 05/23/22    Referring Provider Clayborn Bigness MD      Oxygen   Oxygen Continuous    Liters 3    Maintain Oxygen Saturation 88% or higher      Treadmill   MPH 1.4  Grade 0    Minutes 15    METs 2.07      NuStep   Level 1    SPM 80    Minutes 15    METs 1.6      REL-XR   Level 1    Speed 50    Minutes 15    METs 1.6      Prescription Details   Frequency (times per week) 3    Duration Progress to 30 minutes of continuous aerobic without signs/symptoms of physical distress      Intensity   THRR 40-80% of Max Heartrate 100 - 129    Ratings of Perceived Exertion 11-13    Perceived Dyspnea 0-4      Progression    Progression Continue to progress workloads to maintain intensity without signs/symptoms of physical distress.      Resistance Training   Training Prescription Yes    Weight 3 lb    Reps 10-15             Perform Capillary Blood Glucose checks as needed.  Exercise Prescription Changes:   Exercise Prescription Changes     Row Name 05/23/22 1400 06/06/22 1400 06/20/22 1400 07/04/22 1400 07/17/22 1000     Response to Exercise   Blood Pressure (Admit) 106/66 126/58 110/60 108/60 --   Blood Pressure (Exercise) 124/62 136/72 126/64 -- --   Blood Pressure (Exit) 104/66 116/58 124/80 124/70 --   Heart Rate (Admit) 72 bpm 70 bpm 65 bpm 73 bpm --   Heart Rate (Exercise) 90 bpm 86 bpm 87 bpm 83 bpm --   Heart Rate (Exit) 65 bpm 79 bpm 67 bpm 74 bpm --   Oxygen Saturation (Admit) 93 % 95 % 95 % 92 % --   Oxygen Saturation (Exercise) 87 % 92 % 92 % 90 % --   Oxygen Saturation (Exit) 94 % 94 % 98 % 95 % --   Rating of Perceived Exertion (Exercise) _0 --   Perceived Dyspnea (Exercise) _1 --   Symptoms SOB, right hip tightness SOB SOB SOB --   Comments walk test results -- -- -- --   Duration -- Progress to 30 minutes of  aerobic without signs/symptoms of physical distress Continue with 30 min of aerobic exercise without signs/symptoms of physical distress. Continue with 30 min of aerobic exercise without signs/symptoms of physical distress. --   Intensity -- THRR unchanged THRR unchanged THRR unchanged --     Progression   Progression -- Continue to progress workloads to maintain intensity without signs/symptoms of physical distress. Continue to progress workloads to maintain intensity without signs/symptoms of physical distress. Continue to progress workloads to maintain intensity without signs/symptoms of physical distress. --   Average METs -- 2.37 2.49 2.53 --     Resistance Training   Training Prescription -- Yes Yes Yes --   Weight -- 3 lb 3 lb 3 lb --   Reps -- 10-15  10-15 10-15 --     Interval Training   Interval Training -- No No No --     Oxygen   Oxygen -- Continuous Continuous Continuous --   Liters -- _2 --     Treadmill   MPH -- 2 2.2 2 --   Grade -- 0 0 0 --   Minutes -- _3 --   METs -- 2.53 2.68 2.38 --     NuStep   Level -- 3  3 4 --   Minutes -- _0 --   METs -- 2.3 2.3 3 --     REL-XR   Level -- _1 --   Minutes -- _2 --   METs -- 3 2.5 2 --     Home Exercise Plan   Plans to continue exercise at -- -- -- -- [employer] (comment)  Neurosurgeon   Frequency -- -- -- -- Add 2 additional days to program exercise sessions.   Initial Home Exercises Provided -- -- -- -- 07/17/22     Oxygen   Maintain Oxygen Saturation -- 88% or higher 88% or higher 88% or higher 88% or higher    Row Name 07/17/22 1500             Response to Exercise   Blood Pressure (Admit) 100/58       Blood Pressure (Exit) 124/64       Heart Rate (Admit) 74 bpm       Heart Rate (Exercise) 90 bpm       Heart Rate (Exit) 82 bpm       Oxygen Saturation (Admit) 94 %       Oxygen Saturation (Exercise) 92 %       Oxygen Saturation (Exit) 95 %       Rating of Perceived Exertion (Exercise) 13       Perceived Dyspnea (Exercise) 2       Symptoms SOB       Duration Continue with 30 min of aerobic exercise without signs/symptoms of physical distress.       Intensity THRR unchanged         Progression   Progression Continue to progress workloads to maintain intensity without signs/symptoms of physical distress.       Average METs 3.06         Resistance Training   Training Prescription Yes       Weight 3 lb       Reps 10-15         Interval Training   Interval Training No         Oxygen   Oxygen Continuous       Liters 3         Treadmill   MPH 2       Grade 1.5       Minutes 15       METs 2.81         NuStep   Level 7       Minutes 15       METs 3.6         REL-XR   Level 7       Minutes 15       METs 3.5          Home Exercise Plan   Plans to continue exercise at [employer] (comment)  [employer]       Frequency Add 2 additional days to program exercise sessions.       Initial Home Exercises Provided 07/17/22         Oxygen   Maintain Oxygen Saturation 88% or higher                Exercise Comments:   Exercise Goals and Review:   Exercise Goals     Row Name 05/23/22 1456             Exercise Goals   Increase Physical  Activity Yes       Intervention Provide advice, education, support and counseling about physical activity/exercise needs.;Develop an individualized exercise prescription for aerobic and resistive training based on initial evaluation findings, risk stratification, comorbidities and participant's personal goals.       Expected Outcomes Short Term: Attend rehab on a regular basis to increase amount of physical activity.;Long Term: Add in home exercise to make exercise part of routine and to increase amount of physical activity.;Long Term: Exercising regularly at least 3-5 days a week.       Increase Strength and Stamina Yes       Intervention Provide advice, education, support and counseling about physical activity/exercise needs.;Develop an individualized exercise prescription for aerobic and resistive training based on initial evaluation findings, risk stratification, comorbidities and participant's personal goals.       Expected Outcomes Short Term: Increase workloads from initial exercise prescription for resistance, speed, and METs.;Short Term: Perform resistance training exercises routinely during rehab and add in resistance training at home;Long Term: Improve cardiorespiratory fitness, muscular endurance and strength as measured by increased METs and functional capacity (6MWT)       Able to understand and use rate of perceived exertion (RPE) scale Yes       Intervention Provide education and explanation on how to use RPE scale       Expected Outcomes  Short Term: Able to use RPE daily in rehab to express subjective intensity level;Long Term:  Able to use RPE to guide intensity level when exercising independently       Able to understand and use Dyspnea scale Yes       Intervention Provide education and explanation on how to use Dyspnea scale       Expected Outcomes Short Term: Able to use Dyspnea scale daily in rehab to express subjective sense of shortness of breath during exertion;Long Term: Able to use Dyspnea scale to guide intensity level when exercising independently       Knowledge and understanding of Target Heart Rate Range (THRR) Yes       Intervention Provide education and explanation of THRR including how the numbers were predicted and where they are located for reference       Expected Outcomes Short Term: Able to state/look up THRR;Long Term: Able to use THRR to govern intensity when exercising independently;Short Term: Able to use daily as guideline for intensity in rehab       Able to check pulse independently Yes       Intervention Provide education and demonstration on how to check pulse in carotid and radial arteries.;Review the importance of being able to check your own pulse for safety during independent exercise       Expected Outcomes Short Term: Able to explain why pulse checking is important during independent exercise;Long Term: Able to check pulse independently and accurately       Understanding of Exercise Prescription Yes       Intervention Provide education, explanation, and written materials on patient's individual exercise prescription       Expected Outcomes Short Term: Able to explain program exercise prescription;Long Term: Able to explain home exercise prescription to exercise independently                Exercise Goals Re-Evaluation :  Exercise Goals Re-Evaluation     Row Name 05/24/22 1010 06/06/22 1416 06/20/22 1434 07/04/22 1416 07/17/22 1011     Exercise Goal Re-Evaluation   Exercise Goals Review  -- Increase Physical  Activity;Increase Strength and Stamina;Understanding of Exercise Prescription Increase Physical Activity;Increase Strength and Stamina;Understanding of Exercise Prescription Increase Physical Activity;Increase Strength and Stamina;Understanding of Exercise Prescription Increase Physical Activity;Increase Strength and Stamina;Understanding of Exercise Prescription   Comments Reviewed RPE and dyspnea scales, THR and program prescription with pt today.  Pt voiced understanding and was given a copy of goals to take home. [place] is doing well with rehab. She had an overall average MET level of 2.37 METs. She also increased her speed on the treadmill up to 2 mph. She also improved to level 3 on the T4. We will continue to monitor her progress in the program. Amber Holder continues to do well in rehab. She did increase her treadmill speed to 2.2 mph. her oxygen saturations are staying above 88% with exercise. She has been at a consistent level 1 on the XR and would benefit from increasing that. We will continue to monitor. Amber Holder continues to do well in rehab. She recently increased her average overall MET level to 2.53 METs. She also improved to level 4 on the T4 and level 6 on the XR. We will continue to monitor her progress in the program. --   Expected Outcomes Short: Use RPE daily to regulate intensity. Long: Follow program prescription in THR. Short: Begin to add incline on the treadmill. Long: Continue to increase strength and stamina. Short: Increase level on XR Long: Continue to increase overall MET level Short: Continue to increase workloads as tolerated. Long: Continue to increase strength and stamina. --    Row Name 07/17/22 1052 07/17/22 1517           Exercise Goal Re-Evaluation   Exercise Goals Review Increase Physical Activity;Increase Strength and Stamina;Understanding of Exercise Prescription;Able to understand and use rate of perceived exertion (RPE) scale;Able to check pulse  independently;Knowledge and understanding of Target Heart Rate Range (THRR) Increase Physical Activity;Increase Strength and Stamina;Understanding of Exercise Prescription      Comments Reviewed home exercise with pt today.  Pt plans to go to [employer] for exercise. Right now, she is going 2 days in addition to rehab/ week. She does a mix of treadmill, bike, arm crank, and seated steppers. She does more of her bodyweight strength training at home as she states the machines at the gym are always used. Reviewed THR, pulse, RPE, sign and symptoms, pulse oximetery and when to call 911 or MD.  Also discussed weather considerations and indoor options.  Pt voiced understanding. Amber Holder continues to do well in rehab. She increased to level 7 on both the T4 Nustep and and XR. She also added a 1.5% incline to her treadmill. All in all, her overall METs increased to over 3! She would benefit from increasing to 4 lb handweights. Will continue to monitor.      Expected Outcomes Short: Continue with 2 days of exercise at home, continue to watch O2 and HR during Long: Continue to exercise independently Short: Increase to 4 lb handweights Long: Continue to increase overall MET level               Discharge Exercise Prescription (Final Exercise Prescription Changes):  Exercise Prescription Changes - 07/17/22 1500       Response to Exercise   Blood Pressure (Admit) 100/58    Blood Pressure (Exit) 124/64    Heart Rate (Admit) 74 bpm    Heart Rate (Exercise) 90 bpm    Heart Rate (Exit) 82 bpm    Oxygen Saturation (Admit) 94 %  Oxygen Saturation (Exercise) 92 %    Oxygen Saturation (Exit) 95 %    Rating of Perceived Exertion (Exercise) 13    Perceived Dyspnea (Exercise) 2    Symptoms SOB    Duration Continue with 30 min of aerobic exercise without signs/symptoms of physical distress.    Intensity THRR unchanged      Progression   Progression Continue to progress workloads to maintain intensity without  signs/symptoms of physical distress.    Average METs 3.06      Resistance Training   Training Prescription Yes    Weight 3 lb    Reps 10-15      Interval Training   Interval Training No      Oxygen   Oxygen Continuous    Liters 3      Treadmill   MPH 2    Grade 1.5    Minutes 15    METs 2.81      NuStep   Level 7    Minutes 15    METs 3.6      REL-XR   Level 7    Minutes 15    METs 3.5      Home Exercise Plan   Plans to continue exercise at [employer] (comment)   [employer]   Frequency Add 2 additional days to program exercise sessions.    Initial Home Exercises Provided 07/17/22      Oxygen   Maintain Oxygen Saturation 88% or higher             Nutrition:  Target Goals: Understanding of nutrition guidelines, daily intake of sodium <1563m, cholesterol <2049m calories 30% from fat and 7% or less from saturated fats, daily to have 5 or more servings of fruits and vegetables.  Education: All About Nutrition: -Group instruction provided by verbal, written material, interactive activities, discussions, models, and posters to present general guidelines for heart healthy nutrition including fat, fiber, MyPlate, the role of sodium in heart healthy nutrition, utilization of the nutrition label, and utilization of this knowledge for meal planning. Follow up email sent as well. Written material given at graduation. [facility]  Date 05/24/22  [facility] Review Code 1- Verbalizes Understanding       Biometrics:  Pre Biometrics - 05/23/22 1449       Pre Biometrics   Height 5' 4.6" (1.641 m)    Weight 186 lb 6.4 oz (84.6 kg)    BMI (Calculated) 31.4    Single Leg Stand 7.3 seconds              Nutrition Therapy Plan and Nutrition Goals:  Nutrition Therapy & Goals - 06/05/22 1301       Nutrition Therapy   Diet Heart healthy, low Na, T2DM MNT, Pulmonary MNT     Protein (specify units) 95-100g    Fiber 28 grams    Whole Grain Foods 3 servings    Saturated Fats 15 max. grams    Fruits and Vegetables 8 servings/day    Sodium 1.5 grams      Personal Nutrition Goals   Nutrition Goal ST: practice MyPlate guidelines, try out sourdough bread or whole grain bakery bread LT: meet protein/energy needs, maintain A1c < 7, limit Na <1.5g/day, meet fiber needs of at least 28g/day    Comments [age].o. F admitted to pulmonary rehab for COPD. PMHx includes GERD, T2DM (recent A1C 6.2), OSA, osteoporosis, anxiety, arthritis, hard  of hearing with hearing aids, CKD (no stage on file). PSHx cholecystectomy, rotator cuff repair, THA. Relevant medications includes xanax, lipitor, wellbutrin, vit D3 and ergocalciferol, cranberry tablets, farxiga, flovent, furosemide, magnesium, metformin, metolazone, MVI with minerals, macrobid, 3L O2, omeprazole, K+, prednisone, simethicone, torsemide. PYP Score: 68. Vegetables & Fruits 7/12. Breads, Grains & Cereals 8/12. Red & Processed Meat 8/12. Poultry 2/2. Fish & Shellfish 1/4. Beans, Nuts & Seeds 2/4. Milk & Dairy Foods 3/6. Toppings, Oils, Seasonings & Salt 13/20. Sweets, Snacks & Restaurant Food 14/14. Beverages 10/10.  Today had leftover asparagus with salami and egg with bread roll. she also enjoys eggs on toast (arnold white bread - this is due to price as she does not feel the whole wheat is good quality. She would be open to getting bakery sourdough bread - she likes [employer]. She will take her fluid pill later in the day when she has to run errands, has plans, or when she comes to rehab which can cause her to be up all night. When she goes out to eat at an [place] instead of fast food. She will go out to eat at least 2x/week (once with her son and his wife). She prefers to make food at home. She reports having at least 1 vegetable serving per day and sometimes with have shredded carrots or cababge with sugar and lemon  for dessert. She reports being mindful of her sodium and does not use any with cooking.  Reviewed heart healthy eating, diabetes friendly eating, and pulmonary MNT.      Intervention Plan   Intervention Prescribe, educate and counsel regarding individualized specific dietary modifications aiming towards targeted core components such as weight, hypertension, lipid management, diabetes, heart failure and other comorbidities.    Expected Outcomes Short Term Goal: Understand basic principles of dietary content, such as calories, fat, sodium, cholesterol and nutrients.;Short Term Goal: A plan has been developed with personal nutrition goals set during dietitian appointment.;Long Term Goal: Adherence to prescribed nutrition plan.             Nutrition Assessments:  MEDIFICTS Score Key: ?70 Need to make dietary changes  40-70 Heart Healthy Diet ? 40 Therapeutic Level Cholesterol Diet  Flowsheet Row Pulmonary Rehab from 05/23/2022 in [facility] Cardiac and Pulmonary Rehab  Picture Your Plate Total Score on Admission 68      Picture Your Plate Scores: <09 Unhealthy dietary pattern with much room for improvement. 41-50 Dietary pattern unlikely to meet recommendations for good health and room for improvement. 51-60 More healthful dietary pattern, with some room for improvement.  >60 Healthy dietary pattern, although there may be some specific behaviors that could be improved.   Nutrition Goals Re-Evaluation:  Nutrition Goals Re-Evaluation     [place] Name 06/16/22 1013 07/17/22 0955 07/25/22 1033         Goals   Current Weight 190 lb (86.2 kg) 190 lb (86.2 kg) --     Nutrition Goal Lose some weight ST: practice MyPlate guidelines, try out sourdough bread or whole grain bakery bread LT: meet protein/energy needs, maintain A1c < 7, limit Na <1.5g/day, meet fiber needs of at least 28g/day ST: Modify meals using ideas from this RD - specifics in comment LT: meet protein/energy needs, maintain A1c < 7,  limit Na <1.5g/day, meet fiber needs of at least 28g/day     Comment Patient was informed on why it is important to maintain a balanced diet when dealing with Respiratory issues. Explained that it takes  a lot of energy to breath and when they are short of breath often they will need to have a good diet to help keep up with the calories they are expending for breathing. Amber Holder states she is frustrated because she has gained at least 3 lb, although she has been trying to lose. Currently, she is watching sodium intake by looking at labels and is trying to eat  more fruit as snacks to keep her satieted. Specifically - banaanas, apples, pomegrantate whic she is even putting on her on salads. She does have any appt with the RD again on 11/28 to discuss further details. Amber Holder has made changes since first meeting with this RD including snacking on fruit and using whole wheat and sourdough bread. She would like to lose weight - discusssed behavior changes that can help and discussed swaps and modifications to her current meals. New food recall: B: 1/3 cup honey bunches of oat cereal with whole milk and fruit or an egg with butter on 1 slice of whole wheat or sourdough toast sometimes with peppers and onions and a glass of OJ. Recommended switching to a higher fiber cereal - suggested Kashi (honey almond flax crunch) as this would be similar in crunch and flavor with more fiber, protein, and some heart healthy fats. Encouraged Amber Holder to have at least 1/2 cup of cereal instead of 1/3 cup which can cause her to be hungry soon after; a serving size is 3/4 cup  of the Kashhi cereal. Suggested Fran[place] choose a piece of whole fruit instead of OJ at least half of the time as this will provide more fiber and less sugar per serving. Encouraged [place] to include non-starchy vegetables like peppers and onions most of the time instead of some of the time. L: sandwich made with [place] cheese (she is unsure of the type of cheese this is, but  likens it to [employer]), 2 hot dogs and a slice of bread, salad with an egg and ranch or blue cheese dressing, or homemade soup such as potato soup (uses hot dogsin this soup) or chili (made with pork loin and beans). Suggested a tuna sandwich (limit to 1-2x/week) or a low-sodium [place] sanndwich on whole wheat bread for an easy meal instead of hot dogs. Suggested using a small amount of real blue cheese and then dressed her salad with olive oil and vinegar/lemon or use low sodium [place] dressing as she likes that as well. Suggested pairing her soup with some non-starchy vegetables such as cabbage, broccoli, salad greens, etc. D: She normally has a protein and vegetable or grain. Protein such as chicken (airfryer), steak and fish with butter in a pan, pork chops. Vegetables such as potatoes, sweet potatoes with butter and brown sugar, corn, red cabbage, and peas. Grains such as egg noodles. Discussed MyPlate proportions, advised to choose lean proteins like fish and chicken more often than red meat, use mostly liquid plant oils instead of butter with cooking, and to vary non-starchy vegetables. Suggested snacks like nuts/seeds with her fruit or 1/2 sandiwch on whole wheat bread to include fiber, healthy fat, and protein helping to keep her satiated during the day.     Expected Outcome Short: Choose and plan snacks accordingly to patients caloric intake to improve breathing. Long: Maintain a diet independently that meets their caloric intake to aid in daily shortness of breath. Short: Talk with RD on 11/28 Long: Continue to eat a healthy pulmonary based diet ST: Modify meals using ideas from this  RD - specifics in comment LT: meet protein/energy needs, maintain A1c < 7, limit Na <1.5g/day, meet fiber needs of at least 28g/day              Nutrition Goals Discharge (Final Nutrition Goals Re-Evaluation):  Nutrition Goals Re-Evaluation - 07/25/22 1033       Goals   Nutrition Goal ST: Modify meals  using ideas from this RD - specifics in comment LT: meet protein/energy needs, maintain A1c < 7, limit Na <1.5g/day, meet fiber needs of at least 28g/day    Comment Amber Holder has made changes since first meeting with this RD including snacking on fruit and using whole wheat and sourdough bread. She would like to lose weight - discusssed behavior changes that can help and discussed swaps and modifications to her current meals. New food recall: B: 1/3 cup honey bunches of oat cereal with whole milk and fruit or an egg with butter on 1 slice of whole wheat or sourdough toast sometimes with peppers and onions and a glass of OJ. Recommended switching to a higher fiber cereal - suggested Kashi (honey almond flax crunch) as this would be similar in crunch and flavor with more fiber, protein, and some heart healthy fats. Encouraged Amber Holder to have at least 1/2 cup of cereal instead of 1/3 cup which can cause her to be hungry soon after; a serving size is 3/4 cup  of the Kashhi cereal. Suggested Amber Holder choose a piece of whole fruit instead of OJ at least half of the time as this will provide more fiber and less sugar per serving. Encouraged Amber Holder to include non-starchy vegetables like peppers and onions most of the time instead of some of the time. L: sandwich made with [place] cheese (she is unsure of the type of cheese this is, but likens it to [employer]), 2 hot dogs and a slice of bread, salad with an egg and ranch or blue cheese dressing, or homemade soup such as potato soup (uses hot dogsin this soup) or chili (made with pork loin and beans). Suggested a tuna sandwich (limit to 1-2x/week) or a low-sodium [place] sanndwich on whole wheat bread for an easy meal instead of hot dogs. Suggested using a small amount of real blue cheese and then dressed her salad with olive oil and vinegar/lemon or use low sodium [place] dressing as she likes that as well. Suggested pairing her soup with some non-starchy vegetables such as  cabbage, broccoli, salad greens, etc. D: She normally has a protein and vegetable or grain. Protein such as chicken (airfryer), steak and fish with butter in a pan, pork chops. Vegetables such as potatoes, sweet potatoes with butter and brown sugar, corn, red cabbage, and peas. Grains such as egg noodles. Discussed MyPlate proportions, advised to choose lean proteins like fish and chicken more often than red meat, use mostly liquid plant oils instead of butter with cooking, and to vary non-starchy vegetables. Suggested snacks like nuts/seeds with her fruit or 1/2 sandiwch on whole wheat bread to include fiber, healthy fat, and protein helping to keep her satiated during the day.    Expected Outcome ST: Modify meals using ideas from this RD - specifics in comment LT: meet protein/energy needs, maintain A1c < 7, limit Na <1.5g/day, meet fiber needs of at least 28g/day             Psychosocial: Target Goals: Acknowledge presence or absence of significant depression and/or stress, maximize coping skills, provide positive support system. Participant is able  to verbalize types and ability to use techniques and skills needed for reducing stress and depression.   Education: Stress, Anxiety, and Depression - Group verbal and visual presentation to define topics covered.  Reviews how body is impacted by stress, anxiety, and depression.  Also discusses healthy ways to reduce stress and to treat/manage anxiety and depression.  Written material given at graduation. Flowsheet Row Pulmonary Rehab from 07/05/2022 in [facility] Cardiac and Pulmonary Rehab  Date 06/21/22  Educator [facility]  Instruction Review Code 1- [employer] Understanding       Education: Sleep Hygiene -Provides group verbal and written instruction about how sleep can affect your health.  Define sleep hygiene, discuss sleep cycles and impact of sleep habits. Review good sleep hygiene tips.    Initial Review & Psychosocial Screening:  Initial Psych  Review & Screening - 05/15/22 1054       Initial Review   Current issues with Current Psychotropic Meds      Family Dynamics   Good Support System? Yes    Comments She took WellButrin for awhile to quit smoking and she did. She has a good family support system.      Barriers   Psychosocial barriers to participate in program The patient should benefit from training in stress management and relaxation.      Screening Interventions   Interventions Encouraged to exercise;To provide support and resources with identified psychosocial needs;Provide feedback about the scores to participant    Expected Outcomes Short Term goal: Utilizing psychosocial counselor, staff and physician to assist with identification of specific Stressors or current issues interfering with healing process. Setting desired goal for each stressor or current issue identified.;Long Term Goal: Stressors or current issues are controlled or eliminated.;Short Term goal: Identification and review with participant of any Quality of Life or Depression concerns found by scoring the questionnaire.;Long Term goal: The participant improves quality of Life and PHQ9 Scores as seen by post scores and/or verbalization of changes             Quality of Life Scores:  Scores of 19 and below usually indicate a poorer quality of life in these areas.  A difference of  2-3 points is a clinically meaningful difference.  A difference of 2-3 points in the total score of the Quality of Life Index has been associated with significant improvement in overall quality of life, self-image, physical symptoms, and general health in studies assessing change in quality of life.  PHQ-9: Review Flowsheet  More data exists      07/10/2022 06/16/2022 05/23/2022 05/19/2022 05/18/2022  Depression screen PHQ 2/9  Decreased Interest 0 0 1 0 0  Down, Depressed, Hopeless 1 0 0 0 0  PHQ - 2 Score 1 0 1 0 0  Altered sleeping 0 1 3 - -  Tired, decreased energy _0 - -  Change in appetite _1 - -  Feeling bad or failure about yourself  0 0 0 - -  Trouble concentrating 0 0 2 - -  Moving slowly or fidgety/restless 0 0 0 - -  Suicidal thoughts 0 0 0 - -  PHQ-9 Score _2 - -  Difficult doing work/chores Not difficult at all Somewhat difficult Not difficult at all - -   Interpretation of Total Score  Total Score Depression Severity:  1-4 = Minimal depression, 5-9 = Mild depression, 10-14 = Moderate depression, 15-19 = Moderately severe depression, 20-27 = Severe depression   Psychosocial Evaluation and  Intervention:  Psychosocial Evaluation - 05/15/22 1056       Psychosocial Evaluation & Interventions   Interventions Encouraged to exercise with the program and follow exercise prescription;Stress management education;Relaxation education    Comments She took WellButrin for awhile to quit smoking and she did. She has a good family support system.    Expected Outcomes Short: Start [facility] to help with mood. Long: Maintain a healthy mental state.    Continue Psychosocial Services  Follow up required by staff             Psychosocial Re-Evaluation:  Psychosocial Re-Evaluation     Amber Holder Name 06/16/22 1024 07/17/22 1003           Psychosocial Re-Evaluation   Current issues with Current Stress Concerns;Current Psychotropic Meds;Current Anxiety/Panic Current Psychotropic Meds;Current Anxiety/Panic;Current Stress Concerns      Comments Reviewed patient health questionnaire (PHQ-9) with patient for follow up. Previously, patients score indicated signs/symptoms of depression.  Reviewed to see if patient is improving symptom wise while in program.  Score improved and patient states that it is because she has been able to spend more time with her great gand daughter. Taylon is doing well mentally- does not feel she has had any problems. Fortunately, her PHQ went down from 7 to 4 (it was an 11 to begin with)! She has been on Wellbutrin as a new  medication.. She is taking all her other medications as prescribed. Her sleep is good, though she states she can't sleep when there is a a full moon. Told patient if there are any changes to let us know. She is frustrated that her cardiologist is saying her SOB is lung related, when her pulmonologist is saying it is cardiac related. She recently had an MRI completed to check her valves which she was told are good. As of now, they want to continue what they're doing- and she wants more answers. She is looking to get a new Pulmonologist to get a 2nd opinion.      Expected Outcomes Short: Continue to attend [facility] regularly for regular exercise and social engagement. Long: Continue to improve symptoms and manage a positive mental state. Short: Find new pulmonologist per patient request Long: Continue to utilize exercise for stress management and maintain positive attitude      Interventions Encouraged to attend Pulmonary Rehabilitation for the exercise Encouraged to attend Pulmonary Rehabilitation for the exercise      Continue Psychosocial Services  Follow up required by staff Follow up required by staff               Psychosocial Discharge (Final Psychosocial Re-Evaluation):  Psychosocial Re-Evaluation - 07/17/22 1003       Psychosocial Re-Evaluation   Current issues with Current Psychotropic Meds;Current Anxiety/Panic;Current Stress Concerns    Comments Amber Holder is doing well mentally- does not feel she has had any problems. Fortunately, her PHQ went down from 7 to 4 (it was an 11 to begin with)! She has been on Wellbutrin as a new medication.. She is taking all her other medications as prescribed. Her sleep is good, though she states she can't sleep when there is a a full moon. Told patient if there are any changes to let us know. She is frustrated that her cardiologist is saying her SOB is lung related, when her pulmonologist is saying it is cardiac related. She recently had an MRI completed to  check her valves which she was told are good. As of now, they want to  continue what they're doing- and she wants more answers. She is looking to get a new Pulmonologist to get a 2nd opinion.    Expected Outcomes Short: Find new pulmonologist per patient request Long: Continue to utilize exercise for stress management and maintain positive attitude    Interventions Encouraged to attend Pulmonary Rehabilitation for the exercise    Continue Psychosocial Services  Follow up required by staff             Education: Education Goals: Education classes will be provided on a weekly basis, covering required topics. Participant will state understanding/return demonstration of topics presented.  Learning Barriers/Preferences:  Learning Barriers/Preferences - 05/15/22 1052       Learning Barriers/Preferences   Learning Barriers Hearing    Learning Preferences None             General Pulmonary Education Topics:  Infection Prevention: - Provides verbal and written material to individual with discussion of infection control including proper hand washing and proper equipment cleaning during exercise session. Flowsheet Row Pulmonary Rehab from 07/05/2022 in [facility] Cardiac and Pulmonary Rehab  Date 05/15/22  Educator [facility]  Instruction Review Code 1- Verbalizes Understanding       Falls Prevention: - Provides verbal and written material to individual with discussion of falls prevention and safety. Flowsheet Row Pulmonary Rehab from 07/05/2022 in [facility] Cardiac and Pulmonary Rehab  Date 05/15/22  Educator [facility]  Instruction Review Code 1- Verbalizes Understanding       Chronic Lung Disease Review: - Group verbal instruction with posters, models, PowerPoint presentations and videos,  to review new updates, new respiratory medications, new advancements in procedures and treatments. Providing information on websites and "800" numbers for continued self-education. Includes information about  supplement oxygen, available portable oxygen systems, continuous and intermittent flow rates, oxygen safety, concentrators, and Medicare reimbursement for oxygen. Explanation of Pulmonary Drugs, including class, frequency, complications, importance of spacers, rinsing mouth after steroid MDI's, and proper cleaning methods for nebulizers. Review of basic lung anatomy and physiology related to function, structure, and complications of lung disease. Review of risk factors. Discussion about methods for diagnosing sleep apnea and types of masks and machines for OSA. Includes a review of the use of types of environmental controls: home humidity, furnaces, filters, dust mite/pet prevention, HEPA vacuums. Discussion about weather changes, air quality and the benefits of nasal washing. Instruction on Warning signs, infection symptoms, calling MD promptly, preventive modes, and value of vaccinations. Review of effective airway clearance, coughing and/or vibration techniques. Emphasizing that all should Create an Action Plan. Written material given at graduation. Flowsheet Row Pulmonary Rehab from 07/05/2022 in [facility] Cardiac and Pulmonary Rehab  Education need identified 05/23/22       AED/CPR: - Group verbal and written instruction with the use of models to demonstrate the basic use of the AED with the basic ABC's of resuscitation.    Anatomy and Cardiac Procedures: - Group verbal and visual presentation and models provide information about basic cardiac anatomy and function. Reviews the testing methods done to diagnose heart disease and the outcomes of the test results. Describes the treatment choices: Medical Management, Angioplasty, or Coronary Bypass Surgery for treating various heart conditions including Myocardial Infarction, Angina, Valve Disease, and Cardiac Arrhythmias.  Written material given at graduation.   Medication Safety: - Group verbal and visual instruction to review commonly prescribed  medications for heart and lung disease. Reviews the medication, class of the drug, and side effects. Includes the steps to properly store meds  and maintain the prescription regimen.  Written material given at graduation. Flowsheet Row Pulmonary Rehab from 07/05/2022 in [facility] Cardiac and Pulmonary Rehab  Date 05/31/22  Educator SB  Instruction Review Code 1- Verbalizes Understanding       Other: -Provides group and verbal instruction on various topics (see comments)   Knowledge Questionnaire Score:    Core Components/Risk Factors/Patient Goals at Admission:  Personal Goals and Risk Factors at Admission - 05/23/22 1459       Core Components/Risk Factors/Patient Goals on Admission    Weight Management Yes;Weight Loss;Obesity    Intervention Weight Management: Develop a combined nutrition and exercise program designed to reach desired caloric intake, while maintaining appropriate intake of nutrient and fiber, sodium and fats, and appropriate energy expenditure required for the weight goal.;Weight Management: Provide education and appropriate resources to help participant work on and attain dietary goals.;Weight Management/Obesity: Establish reasonable short term and long term weight goals.;Obesity: Provide education and appropriate resources to help participant work on and attain dietary goals.    Admit Weight 186 lb 6.4 oz (84.6 kg)    Goal Weight: Short Term 181 lb (82.1 kg)    Goal Weight: Long Term 175 lb (79.4 kg)    Expected Outcomes Short Term: Continue to assess and modify interventions until short term weight is achieved;Long Term: Adherence to nutrition and physical activity/exercise program aimed toward attainment of established weight goal;Weight Loss: Understanding of general recommendations for a balanced deficit meal plan, which promotes 1-2 lb weight loss per week and includes a negative energy balance of [phone] kcal/d;Understanding recommendations for meals to include 15-35%  energy as protein, 25-35% energy from fat, 35-60% energy from carbohydrates, less than 222m of dietary cholesterol, 20-35 gm of total fiber daily;Understanding of distribution of calorie intake throughout the day with the consumption of 4-5 meals/snacks    Tobacco Cessation Yes   Quit May 2023   Number of packs per day 0    Intervention Assist the participant in steps to quit. Provide individualized education and counseling about committing to Tobacco Cessation, relapse prevention, and pharmacological support that can be provided by physician.;OAdvice worker assist with locating and accessing local/national Quit Smoking programs, and support quit date choice.    Expected Outcomes Short Term: Will demonstrate readiness to quit, by selecting a quit date.;Short Term: Will quit all tobacco product use, adhering to prevention of relapse plan.;Long Term: Complete abstinence from all tobacco products for at least 12 months from quit date.    Improve shortness of breath with ADL's Yes    Intervention Provide education, individualized exercise plan and daily activity instruction to help decrease symptoms of SOB with activities of daily living.    Expected Outcomes Short Term: Improve cardiorespiratory fitness to achieve a reduction of symptoms when performing ADLs;Long Term: Be able to perform more ADLs without symptoms or delay the onset of symptoms    Diabetes Yes    Intervention Provide education about signs/symptoms and action to take for hypo/hyperglycemia.;Provide education about proper nutrition, including hydration, and aerobic/resistive exercise prescription along with prescribed medications to achieve blood glucose in normal ranges: Fasting glucose 65-99 mg/dL    Expected Outcomes Short Term: Participant verbalizes understanding of the signs/symptoms and immediate care of hyper/hypoglycemia, proper foot care and importance of medication, aerobic/resistive exercise and nutrition plan for  blood glucose control.;Long Term: Attainment of HbA1C < 7%.    Heart Failure Yes    Intervention Provide a combined exercise and nutrition program that is supplemented with  education, support and counseling about heart failure. Directed toward relieving symptoms such as shortness of breath, decreased exercise tolerance, and extremity edema.    Expected Outcomes Improve functional capacity of life;Short term: Attendance in program 2-3 days a week with increased exercise capacity. Reported lower sodium intake. Reported increased fruit and vegetable intake. Reports medication compliance.;Short term: Daily weights obtained and reported for increase. Utilizing diuretic protocols set by physician.;Long term: Adoption of self-care skills and reduction of barriers for early signs and symptoms recognition and intervention leading to self-care maintenance.             Education:Diabetes - Individual verbal and written instruction to review signs/symptoms of diabetes, desired ranges of glucose level fasting, after meals and with exercise. Acknowledge that pre and post exercise glucose checks will be done for 3 sessions at entry of program. Flowsheet Row Pulmonary Rehab from 07/05/2022 in [facility] Cardiac and Pulmonary Rehab  Date 05/15/22  Educator [facility]  Instruction Review Code 1- Verbalizes Understanding       Know Your Numbers and Heart Failure: - Group verbal and visual instruction to discuss disease risk factors for cardiac and pulmonary disease and treatment options.  Reviews associated critical values for Overweight/Obesity, Hypertension, Cholesterol, and Diabetes.  Discusses basics of heart failure: signs/symptoms and treatments.  Introduces Heart Failure Zone chart for action plan for heart failure.  Written material given at graduation. Flowsheet Row Pulmonary Rehab from 07/05/2022 in [facility] Cardiac and Pulmonary Rehab  Date 06/07/22  Educator SB  Instruction Review Code 1- Verbalizes Understanding        Core Components/Risk Factors/Patient Goals Review:   Goals and Risk Factor Review     Row Name 06/16/22 1014 07/17/22 0959           Core Components/Risk Factors/Patient Goals Review   Personal Goals Review Improve shortness of breath with ADL's Improve shortness of breath with ADL's;Tobacco Cessation;Weight Management/Obesity;Diabetes      Review Spoke to patient about their shortness of breath and what they can do to improve. Patient has been informed of breathing techniques when starting the program. Patient is informed to tell staff if they have had any med changes and that certain meds they are taking or not taking can be causing shortness of breath. Mirren states she has gained weight, and since she has felt a little more SOB. She is talking with the RD on 11/28 to discuss further as she has been trying to lose. She states she is inpatient with the breathing but knows staying consistent will help. She did quit smoking in in May (smoked since she was 28) and says she has felt better since. Her doctor put her on Wellbutrin to help with cravings. She had to stop taking metformin as there was a  contraindication with one of her other medications. She does take all of her other medications as instructed. She check her sugars at home which  average 130-140 fasting. Doctor is OK with it and tells her to keep a log. Her doctor told her she takes seveal medications that may increase her sugar.      Expected Outcomes Short: Attend [facility] regularly to improve shortness of breath with ADL's. Long: maintain independence with ADL's Short: Continue to watch weight and continue working towards weight loss, notify doctor of any changes Long: Continue to maintain lifestyle risk factors               Core Components/Risk Factors/Patient Goals at Discharge (Final Review):   Goals and  Risk Factor Review - 07/17/22 0959       Core Components/Risk Factors/Patient Goals Review   Personal Goals  Review Improve shortness of breath with ADL's;Tobacco Cessation;Weight Management/Obesity;Diabetes    Review Amber Holder states she has gained weight, and since she has felt a little more SOB. She is talking with the RD on 11/28 to discuss further as she has been trying to lose. She states she is inpatient with the breathing but knows staying consistent will help. She did quit smoking in in May (smoked since she was 30) and says she has felt better since. Her doctor put her on Wellbutrin to help with cravings. She had to stop taking metformin as there was a  contraindication with one of her other medications. She does take all of her other medications as instructed. She check her sugars at home which  average 130-140 fasting. Doctor is OK with it and tells her to keep a log. Her doctor told her she takes seveal medications that may increase her sugar.    Expected Outcomes Short: Continue to watch weight and continue working towards weight loss, notify doctor of any changes Long: Continue to maintain lifestyle risk factors             ITP Comments:  ITP Comments     Row Name 05/15/22 1050 05/23/22 1413 05/24/22 1009 05/31/22 0800 06/05/22 1410   ITP Comments Virtual Visit completed. Patient informed on EP and RD appointment and 6 Minute walk test. Patient also informed of patient health questionnaires on My Chart. Patient Verbalizes understanding. Visit diagnosis can be found in [facility] 03/13/22. Completed 6MWT and gym orientation. Initial ITP created and sent for review to Dr. Ottie Glazier, Medical Director. First full day of exercise!  Patient was oriented to gym and equipment including functions, settings, policies, and procedures.  Patient's individual exercise prescription and treatment plan were reviewed.  All starting workloads were established based on the results of the 6 minute walk test done at initial orientation visit.  The plan for exercise progression was also introduced and progression will be  customized based on patient's performance and goals. 30 Day review completed. Medical Director ITP review done, changes made as directed, and signed approval by Medical Director.    New to program Completed Initial RD Consulation    Row Name 06/28/22 1028 07/25/22 1032 07/26/22 0947       ITP Comments 30 Day review completed. Medical Director ITP review done, changes made as directed, and signed approval by Medical Director. Completed nutrition follow-up 30 Day review completed. Medical Director ITP review done, changes made as directed, and signed approval by Medical Director.              Comments:

## 2022-07-27 ENCOUNTER — Ambulatory Visit (INDEPENDENT_AMBULATORY_CARE_PROVIDER_SITE_OTHER): Payer: Medicare Other | Admitting: Nurse Practitioner

## 2022-07-27 ENCOUNTER — Encounter: Payer: Self-pay | Admitting: Nurse Practitioner

## 2022-07-27 ENCOUNTER — Other Ambulatory Visit: Payer: Self-pay | Admitting: Physician Assistant

## 2022-07-27 VITALS — BP 125/70 | HR 79 | Temp 95.4°F | Resp 16 | Ht 65.0 in | Wt 186.4 lb

## 2022-07-27 DIAGNOSIS — M810 Age-related osteoporosis without current pathological fracture: Secondary | ICD-10-CM

## 2022-07-27 DIAGNOSIS — N200 Calculus of kidney: Secondary | ICD-10-CM | POA: Diagnosis not present

## 2022-07-27 DIAGNOSIS — N390 Urinary tract infection, site not specified: Secondary | ICD-10-CM

## 2022-07-27 DIAGNOSIS — R300 Dysuria: Secondary | ICD-10-CM | POA: Diagnosis not present

## 2022-07-27 LAB — POCT URINALYSIS DIPSTICK
Bilirubin, UA: NEGATIVE
Glucose, UA: NEGATIVE
Ketones, UA: NEGATIVE
Leukocytes, UA: NEGATIVE
Nitrite, UA: NEGATIVE
Protein, UA: NEGATIVE
Spec Grav, UA: <=1.005 — AB (ref 1.010–1.025)
Urobilinogen, UA: 0.2 E.U./dL
pH, UA: 6.5 (ref 5.0–8.0)

## 2022-07-27 NOTE — Progress Notes (Signed)
[facility] [place], [place] [address]  Internal MEDICINE  Office Visit Note  Patient Name: Amber Holder  [id number]  [id number]  Date of Service: 07/27/2022  Chief Complaint  Patient presents with   Acute Visit    uti     HPI Valerya presents for an acute sick visit for possible kidney stone passes No more pain, trace blood in urine on UA  just finished antibiotic  Hold off on further treatment Wil call with additional symptoms if occur     Current Medication:  Outpatient Encounter Medications as of 07/27/2022  Medication Sig   acetaminophen (TYLENOL) 500 MG chewable tablet Chew 1,000 mg by mouth every 8 (eight) hours as needed for pain.   albuterol (VENTOLIN HFA) 108 (90 Base) MCG/ACT inhaler Inhale 2 puffs into the lungs every 6 (six) hours as needed for wheezing or shortness of breath.   ALPRAZolam (XANAX) 0.25 MG tablet Take one tab po bid for anxiety prn   amoxicillin-clavulanate (AUGMENTIN) 875-125 MG tablet Take 1 tablet by mouth 2 (two) times daily.   aspirin EC 81 MG tablet Take 1 tablet (81 mg total) by mouth daily. Swallow whole.   atorvastatin (LIPITOR) 20 MG tablet Take 1 tablet (20 mg total) by mouth daily.   atorvastatin (LIPITOR) 40 MG tablet Take 40 mg by mouth daily.   cholecalciferol (VITAMIN D) 400 UNITS TABS tablet Take 2,000 Units by mouth daily.   CRANBERRY PO Take 25,000 mg by mouth daily.   dapagliflozin propanediol (FARXIGA) 10 MG TABS tablet Take 1 tablet (10 mg total) by mouth daily before breakfast.   diphenhydrAMINE (BENADRYL) 50 MG tablet Take one tab 13 hrs, 7 hrs and 1 hr before procedure along with prednisone, may take one extra if experienced itching or hives   ergocalciferol (DRISDOL) 1.25 MG (50000 UT) capsule Take one cap q week   fluconazole (DIFLUCAN) 150 MG tablet Take one tab po q week for fungal infection   fluticasone (FLOVENT HFA) 110 MCG/ACT inhaler Inhale 2 puffs into the lungs 2 (two) times daily as  needed.   glucose blood (ONETOUCH VERIO) test strip BLOOD SUGAR TESTING THREE TIMES DAILY . DX E11.65   hydrocortisone cream 0.5 % Apply topically as needed.    hydrocortisone cream 0.5 % Apply topically.   ibandronate (BONIVA) 150 MG tablet Take 1 tablet (150 mg total) by mouth every 30 (thirty) days.   Lancets (ONETOUCH DELICA PLUS GNFAOZ30Q) MISC Use  as directed twice a daily DX E11.65   levofloxacin (LEVAQUIN) 500 MG tablet Take 1 tablet (500 mg total) by mouth daily.   Magnesium 250 MG TABS Take by mouth. Takes 1 tablet 2-3 times per week   metolazone (ZAROXOLYN) 5 MG tablet Take 0.5 tablets (2.5 mg total) by mouth daily. Take 30 mins prior to your Torsemide.   montelukast (SINGULAIR) 10 MG tablet Take 10 mg by mouth daily as needed.   Multiple Vitamins-Minerals (MULTIVITAMIN ADULTS PO) Take 1 tablet by mouth daily.   omeprazole (PRILOSEC) 40 MG capsule Take 1 capsule (40 mg total) by mouth daily.   OXYGEN Inhale 3 L into the lungs. Pt uses [facility] for Oxygen   potassium chloride SA (KLOR-CON M) 20 MEQ tablet TAKE 3 TABS (60 MEQ) IN AM AND 2 TABS (40 MEQ) IN THE PM.   predniSONE & diphenhydrAMINE (CONTRAST ALLERGY PREMED PACK) 3 x 50 MG & 1 x 50 MG KIT Take 50 mg by mouth Once PRN for up to  1 dose (TAKE 13 hour prior to CT angio).   predniSONE (DELTASONE) 50 MG tablet Take one tab 13 hrs, 7 hrs and 1 hr before scheduled time along with Benadryl, may take one extra after the procedure if experienced itching and hives   Simethicone 80 MG TABS Take 1 tablet (80 mg total) by mouth as needed (burping). As directed.   Tiotropium Bromide-Olodaterol 2.5-2.5 MCG/ACT AERS Inhale 2 puffs into the lungs daily.   torsemide (DEMADEX) 20 MG tablet Take 1 tablet (20 mg total) by mouth daily.   No facility-administered encounter medications on file as of 07/27/2022.      Medical History: Past Medical History:  Diagnosis Date   Anxiety 01/02/2012   Arthritis    Asthma    Blockage of  coronary artery of heart (HCC)    Chronic kidney disease    Kidney stones   Colon polyps    COPD (chronic obstructive pulmonary disease) (HCC)    Diabetes mellitus without complication (HCC)    GERD (gastroesophageal reflux disease)    HOH (hard of hearing)    Bilateral hearing aids   Kidney stone    Osteoporosis    Sleep apnea    Does not use C-PAP on a regular basis   Venous stasis      Vital Signs: BP 125/70   Pulse 79   Temp (!) 95.4 F (35.2 C)   Resp 16   Ht _0  (1.651 m)   Wt 186 lb 6.4 oz (84.6 kg)   SpO2 94% Comment: 3L  BMI 31.02 kg/m    Review of Systems  Constitutional:  Negative for chills, fatigue and unexpected weight change.  HENT:  Negative for congestion, rhinorrhea, sneezing and sore throat.   Eyes:  Negative for redness.  Respiratory:  Negative for cough, chest tightness and shortness of breath.   Cardiovascular:  Negative for chest pain and palpitations.  Gastrointestinal:  Negative for abdominal pain, constipation, diarrhea, nausea and vomiting.  Genitourinary:  Negative for dysuria and frequency.  Musculoskeletal:  Negative for arthralgias, back pain, joint swelling and neck pain.  Skin:  Negative for rash.  Neurological: Negative.  Negative for tremors and numbness.  Hematological:  Negative for adenopathy. Does not bruise/bleed easily.  Psychiatric/Behavioral:  Negative for behavioral problems (Depression), sleep disturbance and suicidal ideas. The patient is not nervous/anxious.     Physical Exam Vitals reviewed.  Constitutional:      General: She is not in acute distress.    Appearance: Normal appearance. She is obese. She is not ill-appearing.  HENT:     Head: Normocephalic and atraumatic.  Eyes:     Pupils: Pupils are equal, round, and reactive to light.  Cardiovascular:     Rate and Rhythm: Normal rate and regular rhythm.  Pulmonary:     Effort: Pulmonary effort is normal. No respiratory distress.  Neurological:     Mental  Status: She is alert and oriented to person, place, and time.  Psychiatric:        Mood and Affect: Mood normal.        Behavior: Behavior normal.       Assessment/Plan: 1. Kidney stone Symptoms resolved yesterday, hx of kidney stones, thinks she passed one yesterday. No further intervention, will call clinic if anything changes  2. Dysuria Trace blood in urine on UA, sent for culture - POCT urinalysis dipstick - CULTURE, URINE COMPREHENSIVE   General Counseling: Zoua verbalizes understanding of the findings of todays visit and agrees  with plan of treatment. I have discussed any further diagnostic evaluation that may be needed or ordered today. We also reviewed her medications today. she has been encouraged to call the office with any questions or concerns that should arise related to todays visit.    Counseling:    Orders Placed This Encounter  Procedures   CULTURE, URINE COMPREHENSIVE   POCT urinalysis dipstick    No orders of the defined types were placed in this encounter.   Return if symptoms worsen or fail to improve.  [place] Controlled Substance Database was reviewed by me for overdose risk score (ORS)  Time spent:20 Minutes Time spent with patient included reviewing progress notes, labs, imaging studies, and discussing plan for follow up.   This patient was seen by Jonetta Osgood, FNP-C in collaboration with Dr. Clayborn Bigness as a part of collaborative care agreement.  Eylin Pontarelli R. Valetta Fuller, MSN, FNP-C Internal Medicine

## 2022-07-28 ENCOUNTER — Encounter: Payer: Medicare Other | Attending: Internal Medicine | Admitting: *Deleted

## 2022-07-28 DIAGNOSIS — J449 Chronic obstructive pulmonary disease, unspecified: Secondary | ICD-10-CM | POA: Diagnosis not present

## 2022-07-28 NOTE — Progress Notes (Signed)
Daily Session Note  Patient Details  Name: Amber Holder MRN: [id number] Date of Birth: 01-Oct-1945 Referring Provider:   Flowsheet Row Pulmonary Rehab from 05/23/2022 in [facility] Cardiac and Pulmonary Rehab  Referring Provider Clayborn Bigness MD       Encounter Date: 07/28/2022  Check In:  Session Check In - 07/28/22 0949       Check-In   Supervising physician immediately available to respond to emergencies See telemetry face sheet for immediately available ER MD    Location [facility]-Cardiac & Pulmonary Rehab    Staff Present Alberteen Sam, MA, RCEP, CCRP, CCET;Joseph Homer, [place];Marvell Fuller, PhD, RN, CNS, CEN    Virtual Visit No    Medication changes reported     No    Fall or balance concerns reported    No    Warm-up and Cool-down Performed on first and last piece of equipment    Resistance Training Performed Yes    VAD Patient? No    PAD/SET Patient? No      Pain Assessment   Currently in Pain? No/denies                Social History   Tobacco Use  Smoking Status Former   Packs/day: 1.00   Years: 50.00   Total pack years: 50.00   Types: Cigarettes   Quit date: 12/28/2021   Years since quitting: 0.5   Passive exposure: Never  Smokeless Tobacco Never  Tobacco Comments   1 pack daily//quit 12/28/2021    Goals Met:  Proper associated with RPD/PD & O2 Sat Independence with exercise equipment Exercise tolerated well No report of concerns or symptoms today Strength training completed today  Goals Unmet:  Not Applicable  Comments: Pt able to follow exercise prescription today without complaint.  Will continue to monitor for progression.    Dr. Emily Filbert is Medical Director for [place].  Dr. Ottie Glazier is Medical Director for [facility] Pulmonary Rehabilitation.

## 2022-07-31 ENCOUNTER — Other Ambulatory Visit
Admission: RE | Admit: 2022-07-31 | Discharge: 2022-07-31 | Disposition: A | Discharge status: Home / Self Care | Payer: Medicare Other | Source: Home / Self Care | Attending: Internal Medicine | Admitting: Internal Medicine

## 2022-07-31 ENCOUNTER — Encounter: Payer: Medicare Other | Admitting: *Deleted

## 2022-07-31 DIAGNOSIS — F411 Generalized anxiety disorder: Secondary | ICD-10-CM | POA: Insufficient documentation

## 2022-07-31 DIAGNOSIS — J449 Chronic obstructive pulmonary disease, unspecified: Secondary | ICD-10-CM | POA: Diagnosis not present

## 2022-07-31 DIAGNOSIS — E1165 Type 2 diabetes mellitus with hyperglycemia: Secondary | ICD-10-CM | POA: Insufficient documentation

## 2022-07-31 DIAGNOSIS — I5032 Chronic diastolic (congestive) heart failure: Secondary | ICD-10-CM | POA: Insufficient documentation

## 2022-07-31 DIAGNOSIS — J9611 Chronic respiratory failure with hypoxia: Secondary | ICD-10-CM | POA: Insufficient documentation

## 2022-07-31 LAB — BASIC METABOLIC PANEL
Anion gap: 9 (ref 5–15)
BUN: 23 mg/dL (ref 8–23)
CO2: 26 mmol/L (ref 22–32)
Calcium: 9.4 mg/dL (ref 8.9–10.3)
Chloride: 101 mmol/L (ref 98–111)
Creatinine, Ser: 0.82 mg/dL (ref 0.44–1.00)
GFR, Estimated: >60 mL/min (ref >60)
Glucose, Bld: 119 mg/dL — ABNORMAL HIGH (ref 70–99)
Potassium: 4.5 mmol/L (ref 3.5–5.1)
Sodium: 136 mmol/L (ref 135–145)

## 2022-07-31 NOTE — Progress Notes (Signed)
Daily Session Note  Patient Details  Name: Amber Holder MRN: [id number] Date of Birth: 06-10-1946 Referring Provider:   Flowsheet Row Pulmonary Rehab from 05/23/2022 in [facility] Cardiac and Pulmonary Rehab  Referring Provider Clayborn Bigness MD       Encounter Date: 07/31/2022  Check In:  Session Check In - 07/31/22 1003       Check-In   Supervising physician immediately available to respond to emergencies See telemetry face sheet for immediately available ER MD    Location [facility]-Cardiac & Pulmonary Rehab    Staff Present Darlyne Russian, RN, Doyce Para, BS, ACSM CEP, Exercise Physiologist;Noah Tickle, BS, Exercise Physiologist    Virtual Visit No    Medication changes reported     No    Fall or balance concerns reported    No    Tobacco Cessation No Change    Warm-up and Cool-down Performed on first and last piece of equipment    Resistance Training Performed Yes    VAD Patient? No    PAD/SET Patient? No      Pain Assessment   Currently in Pain? No/denies                Social History   Tobacco Use  Smoking Status Former   Packs/day: 1.00   Years: 50.00   Total pack years: 50.00   Types: Cigarettes   Quit date: 12/28/2021   Years since quitting: 0.5   Passive exposure: Never  Smokeless Tobacco Never  Tobacco Comments   1 pack daily//quit 12/28/2021    Goals Met:  Independence with exercise equipment Exercise tolerated well No report of concerns or symptoms today Strength training completed today  Goals Unmet:  Not Applicable  Comments: Pt able to follow exercise prescription today without complaint.  Will continue to monitor for progression.    Dr. Emily Filbert is Medical Director for [place].  Dr. Ottie Glazier is Medical Director for [facility] Pulmonary Rehabilitation.

## 2022-08-01 LAB — CULTURE, URINE COMPREHENSIVE

## 2022-08-04 ENCOUNTER — Telehealth: Payer: Self-pay

## 2022-08-04 ENCOUNTER — Other Ambulatory Visit: Payer: Self-pay

## 2022-08-04 MED ORDER — PREDNISONE 10 MG PO TABS: ORAL_TABLET | ORAL | 0 refills | Status: DC

## 2022-08-04 NOTE — Telephone Encounter (Signed)
Pt called that she is exposure to RSV  and she having chills and sinus pressure and coughing as per Dr  Humphrey Rolls send prednisone 10 mg 18 tab and keep track for glucose and also advised that she can take tylenol as needed and if her symptoms worse go to ED

## 2022-08-04 NOTE — Progress Notes (Signed)
Urine culture is negative for UTI.

## 2022-08-07 ENCOUNTER — Encounter: Payer: Medicare Other | Admitting: *Deleted

## 2022-08-08 DIAGNOSIS — H6122 Impacted cerumen, left ear: Secondary | ICD-10-CM | POA: Diagnosis not present

## 2022-08-08 DIAGNOSIS — H6531 Chronic mucoid otitis media, right ear: Secondary | ICD-10-CM | POA: Diagnosis not present

## 2022-08-08 DIAGNOSIS — H903 Sensorineural hearing loss, bilateral: Secondary | ICD-10-CM | POA: Diagnosis not present

## 2022-08-09 ENCOUNTER — Encounter: Payer: Medicare Other | Admitting: *Deleted

## 2022-08-14 ENCOUNTER — Encounter: Payer: Medicare Other | Admitting: *Deleted

## 2022-08-14 DIAGNOSIS — J449 Chronic obstructive pulmonary disease, unspecified: Secondary | ICD-10-CM

## 2022-08-14 NOTE — Progress Notes (Signed)
Daily Session Note  Patient Details  Name: Amber Holder MRN: [id number] Date of Birth: 09-Feb-1946 Referring Provider:   Flowsheet Row Pulmonary Rehab from 05/23/2022 in [facility] Cardiac and Pulmonary Rehab  Referring Provider Clayborn Bigness MD       Encounter Date: 08/14/2022  Check In:  Session Check In - 08/14/22 1000       Check-In   Supervising physician immediately available to respond to emergencies See telemetry face sheet for immediately available ER MD    Location [facility]-Cardiac & Pulmonary Rehab    Staff Present Darlyne Russian, RN, Doyce Para, BS, ACSM CEP, Exercise Physiologist;Joseph Tessie Fass, [place]    Virtual Visit No    Medication changes reported     No    Fall or balance concerns reported    No    Tobacco Cessation No Change    Warm-up and Cool-down Performed on first and last piece of equipment    Resistance Training Performed Yes    VAD Patient? No    PAD/SET Patient? No      Pain Assessment   Currently in Pain? No/denies                Social History   Tobacco Use  Smoking Status Former   Packs/day: 1.00   Years: 50.00   Total pack years: 50.00   Types: Cigarettes   Quit date: 12/28/2021   Years since quitting: 0.6   Passive exposure: Never  Smokeless Tobacco Never  Tobacco Comments   1 pack daily//quit 12/28/2021    Goals Met:  Independence with exercise equipment Exercise tolerated well No report of concerns or symptoms today Strength training completed today  Goals Unmet:  Not Applicable  Comments: Pt able to follow exercise prescription today without complaint.  Will continue to monitor for progression.    Dr. Emily Filbert is Medical Director for [place].  Dr. Ottie Glazier is Medical Director for [facility] Pulmonary Rehabilitation.

## 2022-08-16 ENCOUNTER — Encounter: Payer: Medicare Other | Admitting: *Deleted

## 2022-08-16 DIAGNOSIS — J449 Chronic obstructive pulmonary disease, unspecified: Secondary | ICD-10-CM

## 2022-08-16 NOTE — Progress Notes (Signed)
Daily Session Note  Patient Details  Name: Maysie Parkhill MRN: [id number] Date of Birth: [birth year] Referring Provider:   Flowsheet Row Pulmonary Rehab from 05/23/2022 in [facility] Cardiac and Pulmonary Rehab  Referring Provider Clayborn Bigness MD       Encounter Date: 08/16/2022  Check In:  Session Check In - 08/16/22 0959       Check-In   Supervising physician immediately available to respond to emergencies See telemetry face sheet for immediately available ER MD    Location [facility]-Cardiac & Pulmonary Rehab    Staff Present Renita Papa, RN BSN;Joseph Tessie Fass, RCP,RRT,BSRT;Noah [place], [place], Exercise Physiologist    Virtual Visit No    Medication changes reported     No    Fall or balance concerns reported    No    Warm-up and Cool-down Performed on first and last piece of equipment    Resistance Training Performed Yes    VAD Patient? No    PAD/SET Patient? No      Pain Assessment   Currently in Pain? No/denies                Social History   Tobacco Use  Smoking Status Former   Packs/day: 1.00   Years: 50.00   Total pack years: 50.00   Types: Cigarettes   Quit date: 12/28/2021   Years since quitting: 0.6   Passive exposure: Never  Smokeless Tobacco Never  Tobacco Comments   1 pack daily//quit 12/28/2021    Goals Met:  Independence with exercise equipment Exercise tolerated well No report of concerns or symptoms today Strength training completed today  Goals Unmet:  Not Applicable  Comments: Pt able to follow exercise prescription today without complaint.  Will continue to monitor for progression.    Dr. Emily Filbert is Medical Director for [place].  Dr. Ottie Glazier is Medical Director for [facility] Pulmonary Rehabilitation.

## 2022-08-18 ENCOUNTER — Ambulatory Visit: Payer: Medicare Other | Attending: Cardiology | Admitting: Cardiology

## 2022-08-18 ENCOUNTER — Encounter: Payer: Self-pay | Admitting: Cardiology

## 2022-08-18 ENCOUNTER — Encounter: Payer: Medicare Other | Admitting: *Deleted

## 2022-08-18 VITALS — BP 108/60 | HR 79 | Ht 66.0 in | Wt 191.6 lb

## 2022-08-18 DIAGNOSIS — R0609 Other forms of dyspnea: Secondary | ICD-10-CM | POA: Insufficient documentation

## 2022-08-18 DIAGNOSIS — R600 Localized edema: Secondary | ICD-10-CM | POA: Insufficient documentation

## 2022-08-18 DIAGNOSIS — J449 Chronic obstructive pulmonary disease, unspecified: Secondary | ICD-10-CM | POA: Diagnosis not present

## 2022-08-18 DIAGNOSIS — E7800 Pure hypercholesterolemia, unspecified: Secondary | ICD-10-CM | POA: Diagnosis not present

## 2022-08-18 DIAGNOSIS — I2510 Atherosclerotic heart disease of native coronary artery without angina pectoris: Secondary | ICD-10-CM | POA: Diagnosis not present

## 2022-08-18 MED ORDER — TORSEMIDE 20 MG PO TABS: 20.0000 mg | ORAL_TABLET | Freq: Every day | ORAL | 1 refills | Status: DC

## 2022-08-18 NOTE — Progress Notes (Signed)
Daily Session Note  Patient Details  Name: Amber Holder MRN: [id number] Date of Birth: [birth year] Referring Provider:   Flowsheet Row Pulmonary Rehab from 05/23/2022 in [facility] Cardiac and Pulmonary Rehab  Referring Provider Clayborn Bigness MD       Encounter Date: 08/18/2022  Check In:  Session Check In - 08/18/22 1031       Check-In   Supervising physician immediately available to respond to emergencies See telemetry face sheet for immediately available ER MD    Location [facility]-Cardiac & Pulmonary Rehab    Staff Present Heath Lark, RN, BSN, CCRP;Jessica [place], MA, RCEP, CCRP, CCET;Joseph [place], [place]    Virtual Visit No    Medication changes reported     No    Fall or balance concerns reported    No    Warm-up and Cool-down Performed on first and last piece of equipment    Resistance Training Performed Yes    VAD Patient? No    PAD/SET Patient? No      Pain Assessment   Currently in Pain? No/denies                Social History   Tobacco Use  Smoking Status Former   Packs/day: 1.00   Years: 50.00   Total pack years: 50.00   Types: Cigarettes   Quit date: 12/28/2021   Years since quitting: 0.6   Passive exposure: Never  Smokeless Tobacco Never  Tobacco Comments   1 pack daily//quit 12/28/2021    Goals Met:  Proper associated with RPD/PD & O2 Sat Independence with exercise equipment Exercise tolerated well No report of concerns or symptoms today  Goals Unmet:  Not Applicable  Comments: Pt able to follow exercise prescription today without complaint.  Will continue to monitor for progression.    Dr. Emily Filbert is Medical Director for [place].  Dr. Ottie Glazier is Medical Director for [facility] Pulmonary Rehabilitation.

## 2022-08-18 NOTE — Patient Instructions (Signed)
Medication Instructions:  STOP the Metolazone  *If you need a refill on your cardiac medications before your next appointment, please call your pharmacy*   Lab Work: None ordered If you have labs (blood work) drawn today and your tests are completely normal, you will receive your results only by: [place] (if you have MyChart) OR A paper copy in the mail If you have any lab test that is abnormal or we need to change your treatment, we will call you to review the results.   Testing/Procedures: None ordered   Follow-Up: At [facility], you and your health needs are our priority.  As part of our continuing mission to provide you with exceptional heart care, we have created designated Provider Care Teams.  These Care Teams include your primary Cardiologist (physician) and Advanced Practice Providers (APPs -  Physician Assistants and Nurse Practitioners) who all work together to provide you with the care you need, when you need it.  We recommend signing up for the patient portal called "MyChart".  Sign up information is provided on this After Visit Summary.  MyChart is used to connect with patients for Virtual Visits (Telemedicine).  Patients are able to view lab/test results, encounter notes, upcoming appointments, etc.  Non-urgent messages can be sent to your provider as well.   To learn more about what you can do with MyChart, go to [email or web address].    Your next appointment:   3 month(s)  The format for your next appointment:   In Person  Provider:   You may see Dr. Garen Lah or one of the following Advanced Practice Providers on your designated Care Team:   Murray Hodgkins, NP Christell Faith, PA-C Cadence Kathlen Mody, PA-C Gerrie Nordmann, NP    Important Information About Sugar

## 2022-08-18 NOTE — Progress Notes (Signed)
Cardiology Office Note:    Date:  08/18/2022   ID:  Amber Holder, DOB 1946/01/15, MRN [id number]  PCP:  Amber Guise, MD   [facility] Providers Cardiologist:  Amber Sable, MD     Referring MD: Amber Guise, MD   Chief Complaint  Patient presents with   Follow-up    6 month follow up visit. Patient states that she is feeling alright today.  Meds reviewed with patient.     History of Present Illness:    Amber Holder is a 76 y.o. female with a hx of CAD (CTO RCA mild to moderate LCx and LAD dz), hyperlipidemia, diabetes, COPD, former smoker x40+ years, PAD/carotid stenosis who presents for follow-up.    Feels okay, no acute changes since last visit.  Has had episodes of low potassium, metolazone was held with improvement in potassium.  She has noticed slight worsening in leg edema.  Currently takes torsemide 20 mg daily.  Cholesterol levels improved with taking Zetia.  Did not tolerate higher doses of Lipitor, currently on 20 mg daily.  Recently quit smoking.  Prior notes CMR EF 58%, subendocardial scar comprising 75% wall thickness in the LV inferior wall, inferior wall nonfriable. Coronary CTA 10/2021 calcium score 790, CTO proximal RCA, nonobstructive LAD and left circumflex disease Echo 10/2021 EF 60 to 65%, impaired laxation. Outside echocardiogram 2021 showed normal systolic function, diastolic dysfunction EF 93% Chest CT 06/2016 showed coronary artery calcification  Past Medical History:  Diagnosis Date   Anxiety 01/02/2012   Arthritis    Asthma    Blockage of coronary artery of heart (HCC)    Chronic kidney disease    Kidney stones   Colon polyps    COPD (chronic obstructive pulmonary disease) (HCC)    Diabetes mellitus without complication (HCC)    GERD (gastroesophageal reflux disease)    HOH (hard of hearing)    Bilateral hearing aids   Kidney stone    Osteoporosis    Sleep apnea    Does not use C-PAP on a regular basis   Venous stasis      Past Surgical History:  Procedure Laterality Date   APPENDECTOMY     cataract surgery Bilateral    CHOLECYSTECTOMY     COLONOSCOPY     COLONOSCOPY WITH PROPOFOL N/A 05/28/2015   Procedure: COLONOSCOPY WITH PROPOFOL;  Surgeon: Lollie Sails, MD;  Location: [facility] ENDOSCOPY;  Service: Endoscopy;  Laterality: N/A;   COLONOSCOPY WITH PROPOFOL N/A 10/28/2018   Procedure: COLONOSCOPY WITH PROPOFOL;  Surgeon: Lollie Sails, MD;  Location: [facility] ENDOSCOPY;  Service: Endoscopy;  Laterality: N/A;   EYE SURGERY Bilateral 2015   Cataract Extraction with IOL   LITHOTRIPSY  2015   has had many stones   Mastoidotomy  1970   ROTATOR CUFF REPAIR Right 2011   TOTAL HIP ARTHROPLASTY Right 06/14/2016   Procedure: TOTAL HIP ARTHROPLASTY;  Surgeon: Dereck Leep, MD;  Location: [facility] ORS;  Service: Orthopedics;  Laterality: Right;    Current Medications: Current Meds  Medication Sig   albuterol (VENTOLIN HFA) 108 (90 Base) MCG/ACT inhaler Inhale 2 puffs into the lungs every 6 (six) hours as needed for wheezing or shortness of breath.   aspirin EC 81 MG tablet Take 1 tablet (81 mg total) by mouth daily. Swallow whole.   atorvastatin (LIPITOR) 20 MG tablet Take 1 tablet (20 mg total) by mouth daily.   cholecalciferol (VITAMIN D) 400 UNITS TABS tablet Take 2,000 Units by mouth daily.  CRANBERRY PO Take 25,000 mg by mouth daily.   dapagliflozin propanediol (FARXIGA) 10 MG TABS tablet Take 1 tablet (10 mg total) by mouth daily before breakfast.   ergocalciferol (DRISDOL) 1.25 MG (50000 UT) capsule Take one cap q week   hydrocortisone cream 0.5 % Apply topically as needed.    hydrocortisone cream 0.5 % Apply topically.   ibandronate (BONIVA) 150 MG tablet TAKE 1 TABLET (150 MG TOTAL) BY MOUTH EVERY 30 (THIRTY) DAYS.   Lancets (ONETOUCH DELICA PLUS LPFXTK24O) MISC Use  as directed twice a daily DX E11.65   Magnesium 250 MG TABS Take by mouth. Takes 1 tablet 2-3 times per week   Multiple  Vitamins-Minerals (MULTIVITAMIN ADULTS PO) Take 1 tablet by mouth daily.   omeprazole (PRILOSEC) 40 MG capsule Take 1 capsule (40 mg total) by mouth daily.   OXYGEN Inhale 3 L into the lungs. Pt uses [facility] for Oxygen   potassium chloride SA (KLOR-CON M) 20 MEQ tablet TAKE 3 TABS (60 MEQ) IN AM AND 2 TABS (40 MEQ) IN THE PM. (Patient taking differently: 40 mEq 2 (two) times daily. TAKE 3 TABS (60 MEQ) IN AM AND 2 TABS (40 MEQ) IN THE PM.)   Tiotropium Bromide-Olodaterol 2.5-2.5 MCG/ACT AERS Inhale 2 puffs into the lungs daily.   [DISCONTINUED] atorvastatin (LIPITOR) 40 MG tablet Take 40 mg by mouth daily.   [DISCONTINUED] torsemide (DEMADEX) 20 MG tablet Take 1 tablet (20 mg total) by mouth daily.     Allergies:   Iodinated contrast media, Shellfish allergy, Sulfa antibiotics, Sulfasalazine, Percocet [oxycodone-acetaminophen], and Iodine   Social History   Socioeconomic History   Marital status: Divorced    Spouse name: Not on file   Number of children: Not on file   Years of education: Not on file   Highest education level: Not on file  Occupational History   Not on file  Tobacco Use   Smoking status: Former    Packs/day: 1.00    Years: 50.00    Total pack years: 50.00    Types: Cigarettes    Quit date: 12/28/2021    Years since quitting: 0.6    Passive exposure: Never   Smokeless tobacco: Never   Tobacco comments:    1 pack daily//quit 12/28/2021  Vaping Use   Vaping Use: Never used  Substance and Sexual Activity   Alcohol use: Not Currently    Comment: very rarely    Drug use: No   Sexual activity: Not Currently  Other Topics Concern   Not on file  Social History Narrative   Not on file   Social Determinants of Health   Financial Resource Strain: Not on file  Food Insecurity: Not on file  Transportation Needs: Not on file  Physical Activity: Not on file  Stress: Not on file  Social Connections: Not on file     Family History: The patient's family  history includes Breast cancer (age of onset: 21) in her maternal aunt. There is no history of Bladder Cancer or Kidney cancer.  ROS:   Please see the history of present illness.     All other systems reviewed and are negative.  EKGs/Labs/Other Studies Reviewed:    The following studies were reviewed today:   EKG:  EKG is  ordered today.  EKG shows normal sinus rhythm,.  Recent Labs: 05/19/2022: ALT 20; Platelets 237; TSH 1.527 06/12/2022: Hemoglobin 14.9 07/31/2022: BUN 23; Creatinine, Ser 0.82; Potassium 4.5; Sodium 136  Recent Lipid Panel  Component Value Date/Time   CHOL 172 07/07/2022 1112   CHOL 239 (H) 12/24/2020 1154   TRIG 104 07/07/2022 1112   HDL 45 07/07/2022 1112   HDL 55 12/24/2020 1154   CHOLHDL 3.8 07/07/2022 1112   VLDL 21 07/07/2022 1112   LDLCALC 106 (H) 07/07/2022 1112   LDLCALC 168 (H) 12/24/2020 1154     Risk Assessment/Calculations:          Physical Exam:    VS:  BP 108/60 (BP Location: Left Arm, Patient Position: Sitting, Cuff Size: Large)   Pulse 79   Ht '5\' 6"'$  (1.676 m)   Wt 191 lb 9.6 oz (86.9 kg)   SpO2 92%   BMI 30.93 kg/m     Wt Readings from Last 3 Encounters:  08/18/22 191 lb 9.6 oz (86.9 kg)  07/27/22 186 lb 6.4 oz (84.6 kg)  07/11/22 190 lb (86.2 kg)     GEN:  Well nourished, well developed in no acute distress HEENT: Normal NECK: No JVD; No carotid bruits CARDIAC: RRR, no murmurs, rubs, gallops RESPIRATORY: Decreased breath sounds, no wheezing. ABDOMEN: Soft, non-tender, non-distended MUSCULOSKELETAL:  1+ nonpitting edema. SKIN: Warm and dry NEUROLOGIC:  Alert and oriented x 3 PSYCHIATRIC:  Normal affect   ASSESSMENT:    1. Coronary artery disease involving native coronary artery of native heart, unspecified whether angina present   2. Pure hypercholesterolemia   3. Dyspnea on exertion   4. Leg edema    PLAN:    In order of problems listed above:  CAD, CTO RCA.  Calcium score 790.  CMR showed inferior LV  wall nonviable.  EF 58%.  Denies chest pain.  Continue aspirin 81 mg daily, Zetia, Lipitor 20.Marland Kitchen  Echo EF 60 to 65%, impaired relaxation.  No plans for left heart cath, as patient has CTO, patient also has iodine allergies. Hyperlipidemia, cholesterol improving.  Not tolerant to higher dose of Lipitor. continue Zetia 10 mg daily, Lipitor 20 mg. Dyspnea, etiology includes COPD, OSA.  HFpEF.  Diuresis as above.  Management of COPD as per PCP. Leg edema bilaterally, continue torsemide 20 mg daily, okay to take extra dose of torsemide 20 mg for weight gain and edema.  BMP planned in about 2 weeks by PCP.  May consider increasing dose of torsemide.  Continue KCl 40 mg twice daily.  Stop metolazone.  Follow-up in 3 months    Total encounter time more than 40 minutes  Greater than 50% was spent in counseling and coordination of care with the patient    Medication Adjustments/Labs and Tests Ordered: Current medicines are reviewed at length with the patient today.  Concerns regarding medicines are outlined above.  Orders Placed This Encounter  Procedures   EKG 12-Lead   Meds ordered this encounter  Medications   torsemide (DEMADEX) 20 MG tablet    Sig: Take 1 tablet (20 mg total) by mouth daily. May take an extra 20 mg as needed for swelling    Dispense:  30 tablet    Refill:  1    Patient Instructions  Medication Instructions:  STOP the Metolazone  *If you need a refill on your cardiac medications before your next appointment, please call your pharmacy*   Lab Work: None ordered If you have labs (blood work) drawn today and your tests are completely normal, you will receive your results only by: [place] (if you have MyChart) OR A paper copy in the mail If you have any lab test that is abnormal  or we need to change your treatment, we will call you to review the results.   Testing/Procedures: None ordered   Follow-Up: At [facility], you and your health needs are  our priority.  As part of our continuing mission to provide you with exceptional heart care, we have created designated Provider Care Teams.  These Care Teams include your primary Cardiologist (physician) and Advanced Practice Providers (APPs -  Physician Assistants and Nurse Practitioners) who all work together to provide you with the care you need, when you need it.  We recommend signing up for the patient portal called "MyChart".  Sign up information is provided on this After Visit Summary.  MyChart is used to connect with patients for Virtual Visits (Telemedicine).  Patients are able to view lab/test results, encounter notes, upcoming appointments, etc.  Non-urgent messages can be sent to your provider as well.   To learn more about what you can do with MyChart, go to [email or web address].    Your next appointment:   3 month(s)  The format for your next appointment:   In Person  Provider:   You may see Dr. Garen Lah or one of the following Advanced Practice Providers on your designated Care Team:   Murray Hodgkins, NP Christell Faith, PA-C Cadence Kathlen Mody, PA-C Gerrie Nordmann, NP    Important Information About Sugar         Signed, Amber Sable, MD  08/18/2022 12:17 PM    [place]

## 2022-08-23 ENCOUNTER — Encounter: Payer: Medicare Other | Admitting: *Deleted

## 2022-08-23 ENCOUNTER — Encounter: Payer: Self-pay | Admitting: *Deleted

## 2022-08-23 VITALS — Ht 64.6 in | Wt 191.4 lb

## 2022-08-23 DIAGNOSIS — J449 Chronic obstructive pulmonary disease, unspecified: Secondary | ICD-10-CM

## 2022-08-23 NOTE — Patient Instructions (Signed)
Discharge Patient Instructions  Patient Details  Name: Amber Holder MRN: [id number] Date of Birth: 1946/08/24 Referring Provider:  Lavera Guise, MD   Number of Visits: 36  Reason for Discharge:  Patient reached a stable level of exercise. Patient independent in their exercise. Patient has met program and personal goals.  Diagnosis:  Chronic obstructive pulmonary disease, unspecified COPD type [facility])  Initial Exercise Prescription:  Initial Exercise Prescription - 05/23/22 1400       Date of Initial Exercise RX and Referring Provider   Date 05/23/22    Referring Provider Clayborn Bigness MD      Oxygen   Oxygen Continuous    Liters 3    Maintain Oxygen Saturation 88% or higher      Treadmill   MPH 1.4    Grade 0    Minutes 15    METs 2.07      NuStep   Level 1    SPM 80    Minutes 15    METs 1.6      REL-XR   Level 1    Speed 50    Minutes 15    METs 1.6      Prescription Details   Frequency (times per week) 3    Duration Progress to 30 minutes of continuous aerobic without signs/symptoms of physical distress      Intensity   THRR 40-80% of Max Heartrate 100 - 129    Ratings of Perceived Exertion 11-13    Perceived Dyspnea 0-4      Progression   Progression Continue to progress workloads to maintain intensity without signs/symptoms of physical distress.      Resistance Training   Training Prescription Yes    Weight 3 lb    Reps 10-15             Discharge Exercise Prescription (Final Exercise Prescription Changes):  Exercise Prescription Changes - 08/01/22 1500       Response to Exercise   Blood Pressure (Admit) 102/58    Blood Pressure (Exit) 102/60    Heart Rate (Admit) 67 bpm    Heart Rate (Exercise) 91 bpm    Heart Rate (Exit) 69 bpm    Oxygen Saturation (Admit) 92 %    Oxygen Saturation (Exercise) 91 %    Oxygen Saturation (Exit) 94 %    Rating of Perceived Exertion (Exercise) 15    Perceived Dyspnea (Exercise) 3    Symptoms SOB     Duration Continue with 30 min of aerobic exercise without signs/symptoms of physical distress.    Intensity THRR unchanged      Progression   Progression Continue to progress workloads to maintain intensity without signs/symptoms of physical distress.    Average METs 3.04      Resistance Training   Training Prescription Yes    Weight 3 lb    Reps 10-15      Interval Training   Interval Training No      Oxygen   Oxygen Continuous    Liters 2-3      Treadmill   MPH 2.2    Grade 1    Minutes 15    METs 2.99      Recumbant Bike   Level 2    Minutes 15    METs 2      NuStep   Level 7    Minutes 15    METs 3.6      REL-XR   Level 6  Minutes 15    METs 3.4      T5 Nustep   Level 6    Minutes 15    METs 2.3      Home Exercise Plan   Plans to continue exercise at [employer] (comment)   [employer]   Frequency Add 2 additional days to program exercise sessions.    Initial Home Exercises Provided 07/17/22      Oxygen   Maintain Oxygen Saturation 88% or higher             Functional Capacity:  6 Minute Walk     Row Name 05/23/22 1450 08/23/22 1005       6 Minute Walk   Phase Initial Discharge    Distance 990 feet 1225 feet    Distance % Change -- 23.7 %    Distance Feet Change -- 235 ft    Walk Time 6 minutes 6 minutes    # of Rest Breaks 0 0    MPH 1.87 2.32    METS 1.62 1.96    RPE 15 16    Perceived Dyspnea  3 3    VO2 Peak 5.67 6.87    Symptoms Yes (comment) Yes (comment)    Comments Right hip tightness, SOB SOB    Resting HR 72 bpm 73 bpm    Resting BP 106/66 146/74    Resting Oxygen Saturation  93 % 90 %    Exercise Oxygen Saturation  during 6 min walk 87 % 86 %    Max Ex. HR 90 bpm 94 bpm    Max Ex. BP 124/62 122/64    2 Minute Post BP 104/66 --      Interval HR   1 Minute HR 83 73    2 Minute HR 85 82    3 Minute HR 87 91    4 Minute HR 88 94    5 Minute HR 89 94    6 Minute HR 90 94    2 Minute Post HR 77 --     Interval Heart Rate? Yes Yes      Interval Oxygen   Interval Oxygen? Yes Yes    Baseline Oxygen Saturation % 93 % 90 %    1 Minute Oxygen Saturation % 92 % 90 %    1 Minute Liters of Oxygen 3 L 2 L  Pulsed    2 Minute Oxygen Saturation % 88 % 86 %    2 Minute Liters of Oxygen 3 L 2 L  Pulsed    3 Minute Oxygen Saturation % 87 % 86 %    3 Minute Liters of Oxygen 3 L 2 L  Pulsed    4 Minute Oxygen Saturation % 88 % 86 %    4 Minute Liters of Oxygen 3 L 2 L  Pulsed    5 Minute Oxygen Saturation % 87 % 86 %    5 Minute Liters of Oxygen 3 L 2 L  Pulsed    6 Minute Oxygen Saturation % 87 % 86 %    6 Minute Liters of Oxygen 3 L 2 L  Pulsed    2 Minute Post Oxygen Saturation % 94 % --    2 Minute Post Liters of Oxygen 3 L --            Nutrition & Weight - Outcomes:  Pre Biometrics - 05/23/22 1449       Pre Biometrics   Height 5' 4.6" (1.641  m)    Weight 186 lb 6.4 oz (84.6 kg)    BMI (Calculated) 31.4    Single Leg Stand 7.3 seconds             Post Biometrics - 08/23/22 1010        Post  Biometrics   Height 5' 4.6" (1.641 m)    Weight 191 lb 6.4 oz (86.8 kg)    BMI (Calculated) 32.24    Single Leg Stand 3.8 seconds            Nutrition:  Nutrition Therapy & Goals - 06/05/22 1301       Nutrition Therapy   Diet Heart healthy, low Na, T2DM MNT, Pulmonary MNT    Protein (specify units) 95-100g    Fiber 28 grams    Whole Grain Foods 3 servings    Saturated Fats 15 max. grams    Fruits and Vegetables 8 servings/day    Sodium 1.5 grams      Personal Nutrition Goals   Nutrition Goal ST: practice MyPlate guidelines, try out sourdough bread or whole grain bakery bread LT: meet protein/energy needs, maintain A1c < 7, limit Na <1.5g/day, meet fiber needs of at least 28g/day    Comments 76 y.o. F admitted to pulmonary rehab for COPD. PMHx includes GERD, T2DM (recent A1C 6.2), OSA, osteoporosis, anxiety, arthritis, hard of hearing with hearing aids, CKD (no stage  on file). PSHx cholecystectomy, rotator cuff repair, THA. Relevant medications includes xanax, lipitor, wellbutrin, vit D3 and ergocalciferol, cranberry tablets, farxiga, flovent, furosemide, magnesium, metformin, metolazone, MVI with minerals, macrobid, 3L O2, omeprazole, K+, prednisone, simethicone, torsemide. PYP Score: 68. Vegetables & Fruits 7/12. Breads, Grains & Cereals 8/12. Red & Processed Meat 8/12. Poultry 2/2. Fish & Shellfish 1/4. Beans, Nuts & Seeds 2/4. Milk & Dairy Foods 3/6. Toppings, Oils, Seasonings & Salt 13/20. Sweets, Snacks & Restaurant Food 14/14. Beverages 10/10.  Today had leftover asparagus with salami and egg with bread roll. she also enjoys eggs on toast (arnold white bread - this is due to price as she does not feel the whole wheat is good quality. She would be open to getting bakery sourdough bread - she likes [employer]. She will take her fluid pill later in the day when she has to run errands, has plans, or when she comes to rehab which can cause her to be up all night. When she goes out to eat at an [place] instead of fast food. She will go out to eat at least 2x/week (once with her son and his wife). She prefers to make food at home. She reports having at least 1 vegetable serving per day and sometimes with have shredded carrots or cababge with sugar and lemon for dessert. She reports being mindful of her sodium and does not use any with cooking.  Reviewed heart healthy eating, diabetes friendly eating, and pulmonary MNT.      Intervention Plan   Intervention Prescribe, educate and counsel regarding individualized specific dietary modifications aiming towards targeted core components such as weight, hypertension, lipid management, diabetes, heart failure and other comorbidities.    Expected Outcomes Short Term Goal: Understand basic principles of dietary content, such as calories, fat, sodium, cholesterol and nutrients.;Short Term Goal: A plan has been  developed with personal nutrition goals set during dietitian appointment.;Long Term Goal: Adherence to prescribed nutrition plan.

## 2022-08-23 NOTE — Progress Notes (Signed)
Pulmonary Individual Treatment Plan  Patient Details  Name: Peni Rupard MRN: [id number] Date of Birth: 04-11-1946 Referring Provider:   Flowsheet Row Pulmonary Rehab from 05/23/2022 in [facility] Cardiac and Pulmonary Rehab  Referring Provider Clayborn Bigness MD       Initial Encounter Date:  Flowsheet Row Pulmonary Rehab from 05/23/2022 in [facility] Cardiac and Pulmonary Rehab  Date 05/23/22       Visit Diagnosis: Chronic obstructive pulmonary disease, unspecified COPD type ([place])  Patient's Home Medications on Admission:  Current Outpatient Medications:    acetaminophen (TYLENOL) 500 MG chewable tablet, Chew 1,000 mg by mouth every 8 (eight) hours as needed for pain., Disp: , Rfl:    albuterol (VENTOLIN HFA) 108 (90 Base) MCG/ACT inhaler, Inhale 2 puffs into the lungs every 6 (six) hours as needed for wheezing or shortness of breath., Disp: 18 g, Rfl: 2   ALPRAZolam (XANAX) 0.25 MG tablet, Take one tab po bid for anxiety prn (Patient not taking: Reported on 08/18/2022), Disp: 30 tablet, Rfl: 1   amoxicillin-clavulanate (AUGMENTIN) 875-125 MG tablet, Take 1 tablet by mouth 2 (two) times daily. (Patient not taking: Reported on 08/18/2022), Disp: 20 tablet, Rfl: 0   aspirin EC 81 MG tablet, Take 1 tablet (81 mg total) by mouth daily. Swallow whole., Disp: 90 tablet, Rfl: 3   atorvastatin (LIPITOR) 20 MG tablet, Take 1 tablet (20 mg total) by mouth daily., Disp: 90 tablet, Rfl: 3   cholecalciferol (VITAMIN D) 400 UNITS TABS tablet, Take 2,000 Units by mouth daily., Disp: , Rfl:    CRANBERRY PO, Take 25,000 mg by mouth daily., Disp: , Rfl:    dapagliflozin propanediol (FARXIGA) 10 MG TABS tablet, Take 1 tablet (10 mg total) by mouth daily before breakfast., Disp: 90 tablet, Rfl: 3   diphenhydrAMINE (BENADRYL) 50 MG tablet, Take one tab 13 hrs, 7 hrs and 1 hr before procedure along with prednisone, may take one extra if experienced itching or hives (Patient not taking: Reported on 08/18/2022), Disp: 4  tablet, Rfl: 0   ergocalciferol (DRISDOL) 1.25 MG (50000 UT) capsule, Take one cap q week, Disp: 12 capsule, Rfl: 3   fluconazole (DIFLUCAN) 150 MG tablet, Take one tab po q week for fungal infection (Patient not taking: Reported on 08/18/2022), Disp: 4 tablet, Rfl: 0   fluticasone (FLOVENT HFA) 110 MCG/ACT inhaler, Inhale 2 puffs into the lungs 2 (two) times daily as needed. (Patient not taking: Reported on 08/18/2022), Disp: , Rfl:    glucose blood (ONETOUCH VERIO) test strip, BLOOD SUGAR TESTING THREE TIMES DAILY . DX E11.65, Disp: 100 strip, Rfl: 3   hydrocortisone cream 0.5 %, Apply topically as needed. , Disp: , Rfl:    hydrocortisone cream 0.5 %, Apply topically., Disp: , Rfl:    ibandronate (BONIVA) 150 MG tablet, TAKE 1 TABLET (150 MG TOTAL) BY MOUTH EVERY 30 (THIRTY) DAYS., Disp: 3 tablet, Rfl: 3   Lancets (ONETOUCH DELICA PLUS [id number]) MISC, Use  as directed twice a daily DX E11.65, Disp: 100 each, Rfl: 3   levofloxacin (LEVAQUIN) 500 MG tablet, Take 1 tablet (500 mg total) by mouth daily. (Patient not taking: Reported on 08/18/2022), Disp: 10 tablet, Rfl: 0   Magnesium 250 MG TABS, Take by mouth. Takes 1 tablet 2-3 times per week, Disp: , Rfl:    montelukast (SINGULAIR) 10 MG tablet, Take 10 mg by mouth daily as needed. (Patient not taking: Reported on 08/18/2022), Disp: , Rfl:    Multiple Vitamins-Minerals (MULTIVITAMIN ADULTS PO), Take 1 tablet  by mouth daily., Disp: , Rfl:    omeprazole (PRILOSEC) 40 MG capsule, Take 1 capsule (40 mg total) by mouth daily., Disp: , Rfl:    OXYGEN, Inhale 3 L into the lungs. Pt uses [facility] for Oxygen, Disp: , Rfl:    potassium chloride SA (KLOR-CON M) 20 MEQ tablet, TAKE 3 TABS (60 MEQ) IN AM AND 2 TABS (40 MEQ) IN THE PM. (Patient taking differently: 40 mEq 2 (two) times daily. TAKE 3 TABS (60 MEQ) IN AM AND 2 TABS (40 MEQ) IN THE PM.), Disp: 300 tablet, Rfl: 2   predniSONE (DELTASONE) 10 MG tablet, Take 1 tab po 3 x day for 3 days then  take 1 tab po 2 x a day for 3 days and then take 1 tab po daily for 3 days (Patient not taking: Reported on 08/18/2022), Disp: 18 tablet, Rfl: 0   Simethicone 80 MG TABS, Take 1 tablet (80 mg total) by mouth as needed (burping). As directed. (Patient not taking: Reported on 08/18/2022), Disp: , Rfl: 0   Tiotropium Bromide-Olodaterol 2.5-2.5 MCG/ACT AERS, Inhale 2 puffs into the lungs daily., Disp: 1 each, Rfl: 4   torsemide (DEMADEX) 20 MG tablet, Take 1 tablet (20 mg total) by mouth daily. May take an extra 20 mg as needed for swelling, Disp: 30 tablet, Rfl: 1  Past Medical History: Past Medical History:  Diagnosis Date   Anxiety 01/02/2012   Arthritis    Asthma    Blockage of coronary artery of heart (HCC)    Chronic kidney disease    Kidney stones   Colon polyps    COPD (chronic obstructive pulmonary disease) (HCC)    Diabetes mellitus without complication (HCC)    GERD (gastroesophageal reflux disease)    HOH (hard of hearing)    Bilateral hearing aids   Kidney stone    Osteoporosis    Sleep apnea    Does not use C-PAP on a regular basis   Venous stasis     Tobacco Use: Social History   Tobacco Use  Smoking Status Former   Packs/day: 1.00   Years: 50.00   Total pack years: 50.00   Types: Cigarettes   Quit date: 12/28/2021   Years since quitting: 0.6   Passive exposure: Never  Smokeless Tobacco Never  Tobacco Comments   1 pack daily//quit 12/28/2021    Labs: Review Flowsheet  More data exists      Latest Ref Rng & Units 01/10/2022 01/16/2022 04/24/2022 05/19/2022 07/07/2022  Labs for ITP Cardiac and Pulmonary Rehab  Cholestrol 0 - 200 mg/dL - 261  - 162  172   LDL (calc) 0 - 99 mg/dL - 182  - 92  106   HDL-C >40 mg/dL - 52  - 48  45   Trlycerides <150 mg/dL - 134  - 111  104   Hemoglobin A1c 4.0 - 5.6 % 6.5  - 6.2  - -     Pulmonary Assessment Scores:  Pulmonary Assessment Scores     Row Name 05/23/22 1415         ADL UCSD   ADL Phase Entry     SOB  Score total 62     Rest 2     Walk 3     Stairs 5     Bath 2     Dress 2     Shop 3       CAT Score   CAT Score 17  mMRC Score   mMRC Score 2              UCSD: Self-administered rating of dyspnea associated with activities of daily living (ADLs) 6-point scale (0 = "not at all" to 5 = "maximal or unable to do because of breathlessness")  Scoring Scores range from 0 to 120.  Minimally important difference is 5 units  CAT: CAT can identify the health impairment of COPD patients and is better correlated with disease progression.  CAT has a scoring range of zero to 40. The CAT score is classified into four groups of low (less than 10), medium (10 - 20), high (21-30) and very high (31-40) based on the impact level of disease on health status. A CAT score over 10 suggests significant symptoms.  A worsening CAT score could be explained by an exacerbation, poor medication adherence, poor inhaler technique, or progression of COPD or comorbid conditions.  CAT MCID is 2 points  mMRC: mMRC (Modified Medical Research Council) Dyspnea Scale is used to assess the degree of baseline functional disability in patients of respiratory disease due to dyspnea. No minimal important difference is established. A decrease in score of 1 point or greater is considered a positive change.   Pulmonary Function Assessment:  Pulmonary Function Assessment - 05/15/22 1052       Breath   Shortness of Breath Yes;Limiting activity             Exercise Target Goals: Exercise Program Goal: Individual exercise prescription set using results from initial 6 min walk test and THRR while considering  patient's activity barriers and safety.   Exercise Prescription Goal: Initial exercise prescription builds to 30-45 minutes a day of aerobic activity, 2-3 days per week.  Home exercise guidelines will be given to patient during program as part of exercise prescription that the participant will  acknowledge.  Education: Aerobic Exercise: - Group verbal and visual presentation on the components of exercise prescription. Introduces F.I.T.T principle from ACSM for exercise prescriptions.  Reviews F.I.T.T. principles of aerobic exercise including progression. Written material given at graduation. Flowsheet Row Pulmonary Rehab from 08/23/2022 in [facility] Cardiac and Pulmonary Rehab  Date 07/05/22  Educator [facility]  Instruction Review Code 1- Verbalizes Understanding       Education: Resistance Exercise: - Group verbal and visual presentation on the components of exercise prescription. Introduces F.I.T.T principle from ACSM for exercise prescriptions  Reviews F.I.T.T. principles of resistance exercise including progression. Written material given at graduation.    Education: Exercise & Equipment Safety: - Individual verbal instruction and demonstration of equipment use and safety with use of the equipment. Flowsheet Row Pulmonary Rehab from 08/23/2022 in [facility] Cardiac and Pulmonary Rehab  Date 05/15/22  Educator [facility]  Instruction Review Code 1- Verbalizes Understanding       Education: Exercise Physiology & General Exercise Guidelines: - Group verbal and written instruction with models to review the exercise physiology of the cardiovascular system and associated critical values. Provides general exercise guidelines with specific guidelines to those with heart or lung disease.  Flowsheet Row Pulmonary Rehab from 08/23/2022 in [facility] Cardiac and Pulmonary Rehab  Date 06/28/22  Educator [facility]  Instruction Review Code 1- Verbalizes Understanding       Education: Flexibility, Balance, Mind/Body Relaxation: - Group verbal and visual presentation with interactive activity on the components of exercise prescription. Introduces F.I.T.T principle from ACSM for exercise prescriptions. Reviews F.I.T.T. principles of flexibility and balance exercise training including progression. Also discusses the mind  body  connection.  Reviews various relaxation techniques to help reduce and manage stress (i.e. Deep breathing, progressive muscle relaxation, and visualization). Balance handout provided to take home. Written material given at graduation.   Activity Barriers & Risk Stratification:  Activity Barriers & Cardiac Risk Stratification - 05/23/22 1452       Activity Barriers & Cardiac Risk Stratification   Activity Barriers Right Hip Replacement;Deconditioning;Muscular Weakness;Shortness of Breath;Decreased Ventricular Function             6 Minute Walk:  6 Minute Walk     Row Name 05/23/22 1450 08/23/22 1005       6 Minute Walk   Phase Initial Discharge    Distance 990 feet 1225 feet    Distance % Change -- 23.7 %    Distance Feet Change -- 235 ft    Walk Time 6 minutes 6 minutes    # of Rest Breaks 0 0    MPH 1.87 2.32    METS 1.62 1.96    RPE 15 16    Perceived Dyspnea  3 3    VO2 Peak 5.67 6.87    Symptoms Yes (comment) Yes (comment)    Comments Right hip tightness, SOB SOB    Resting HR 72 bpm 73 bpm    Resting BP 106/66 146/74    Resting Oxygen Saturation  93 % 90 %    Exercise Oxygen Saturation  during 6 min walk 87 % 86 %    Max Ex. HR 90 bpm 94 bpm    Max Ex. BP 124/62 122/64    2 Minute Post BP 104/66 --      Interval HR   1 Minute HR 83 73    2 Minute HR 85 82    3 Minute HR 87 91    4 Minute HR 88 94    5 Minute HR 89 94    6 Minute HR 90 94    2 Minute Post HR 77 --    Interval Heart Rate? Yes Yes      Interval Oxygen   Interval Oxygen? Yes Yes    Baseline Oxygen Saturation % 93 % 90 %    1 Minute Oxygen Saturation % 92 % 90 %    1 Minute Liters of Oxygen 3 L 2 L  Pulsed    2 Minute Oxygen Saturation % 88 % 86 %    2 Minute Liters of Oxygen 3 L 2 L  Pulsed    3 Minute Oxygen Saturation % 87 % 86 %    3 Minute Liters of Oxygen 3 L 2 L  Pulsed    4 Minute Oxygen Saturation % 88 % 86 %    4 Minute Liters of Oxygen 3 L 2 L  Pulsed    5 Minute Oxygen  Saturation % 87 % 86 %    5 Minute Liters of Oxygen 3 L 2 L  Pulsed    6 Minute Oxygen Saturation % 87 % 86 %    6 Minute Liters of Oxygen 3 L 2 L  Pulsed    2 Minute Post Oxygen Saturation % 94 % --    2 Minute Post Liters of Oxygen 3 L --            Oxygen Initial Assessment:  Oxygen Initial Assessment - 08/14/22 1011       Home Oxygen   Home Oxygen Device Home Concentrator;E-Tanks;Portable Concentrator    Sleep Oxygen Prescription Continuous  Liters per minute 2.5    Home Exercise Oxygen Prescription Continuous    Liters per minute 2    Home Resting Oxygen Prescription Continuous    Liters per minute 2.5    Compliance with Home Oxygen Use Yes      Program Oxygen Prescription   Program Oxygen Prescription Continuous    Liters per minute 2   Decreased from 3L to 2L     Intervention   Short Term Goals To learn and demonstrate proper use of respiratory medications;To learn and understand importance of monitoring SPO2 with pulse oximeter and demonstrate accurate use of the pulse oximeter.;To learn and exhibit compliance with exercise, home and travel O2 prescription;To learn and understand importance of maintaining oxygen saturations>88%    Long  Term Goals Demonstrates proper use of MDI's;Maintenance of O2 saturations>88%;Compliance with respiratory medication;Exhibits proper breathing techniques, such as pursed lip breathing or other method taught during program session;Verbalizes importance of monitoring SPO2 with pulse oximeter and return demonstration             Oxygen Re-Evaluation:  Oxygen Re-Evaluation     Row Name 05/24/22 1029 06/16/22 1008 07/17/22 1007 08/14/22 1011 08/14/22 1012     Program Oxygen Prescription   Program Oxygen Prescription -- Continuous -- -- --   Liters per minute -- 3 -- -- --     Home Oxygen   Home Oxygen Device -- Home Concentrator;E-Tanks;Portable Concentrator -- -- --   Sleep Oxygen Prescription -- Continuous -- -- --   Liters  per minute -- 2.5 -- -- --   Home Exercise Oxygen Prescription -- Continuous -- -- --   Liters per minute -- 3 -- -- --   Home Resting Oxygen Prescription -- Continuous -- -- --   Liters per minute -- 2 -- -- --   Compliance with Home Oxygen Use -- Yes -- -- --     Goals/Expected Outcomes   Short Term Goals -- To learn and demonstrate proper use of respiratory medications -- -- --   Long  Term Goals -- Demonstrates proper use of MDI's -- -- --   Comments Reviewed PLB technique with pt.  Talked about how it works and it's importance in maintaining their exercise saturations. -- Messiah is doing well and watches her O2 consistently. She usually eanges 94-95% at rest and typically 91-92% when she is active. She forgets to PLB sometimes but has been trying to get better at getting in the habit of doing it. She is in the process of finding a new Pulmonologist. Bernarda has been sick and slowly starting to get back into the groove of things again. She still watches her O2 diligently, usually it ranges 94-95% at rest. with activity it can drop to anywhere between 89-92%. [place] had to walk a far distance and ended up dropping to 84%, she luckily has her pulse ox with her. She stopped and rested and PLB and it went back up to 90%. She watches her O2 consistently and taking all of her medications and inhalers as needed. She is going to finish the program after the holidays. --   Goals/Expected Outcomes Short: Become more profiecient at using PLB.   Long: Become independent at using PLB. -- Short: Keep practicing PLB Long: Become independent at using PLB Short: Continue to monitor oxygen saturations Long: Become proficient at using PLB --            Oxygen Discharge (Final Oxygen Re-Evaluation):  Oxygen Re-Evaluation - 08/14/22 1012       Goals/Expected Outcomes   Comments --             Initial Exercise Prescription:  Initial Exercise Prescription - 05/23/22 1400        Date of Initial Exercise RX and Referring Provider   Date 05/23/22    Referring Provider Clayborn Bigness MD      Oxygen   Oxygen Continuous    Liters 3    Maintain Oxygen Saturation 88% or higher      Treadmill   MPH 1.4    Grade 0    Minutes 15    METs 2.07      NuStep   Level 1    SPM 80    Minutes 15    METs 1.6      REL-XR   Level 1    Speed 50    Minutes 15    METs 1.6      Prescription Details   Frequency (times per week) 3    Duration Progress to 30 minutes of continuous aerobic without signs/symptoms of physical distress      Intensity   THRR 40-80% of Max Heartrate 100 - 129    Ratings of Perceived Exertion 11-13    Perceived Dyspnea 0-4      Progression   Progression Continue to progress workloads to maintain intensity without signs/symptoms of physical distress.      Resistance Training   Training Prescription Yes    Weight 3 lb    Reps 10-15             Perform Capillary Blood Glucose checks as needed.  Exercise Prescription Changes:   Exercise Prescription Changes     Row Name 05/23/22 1400 06/06/22 1400 06/20/22 1400 07/04/22 1400 07/17/22 1000     Response to Exercise   Blood Pressure (Admit) 106/66 126/58 110/60 108/60 --   Blood Pressure (Exercise) 124/62 136/72 126/64 -- --   Blood Pressure (Exit) 104/66 116/58 124/80 124/70 --   Heart Rate (Admit) 72 bpm 70 bpm 65 bpm 73 bpm --   Heart Rate (Exercise) 90 bpm 86 bpm 87 bpm 83 bpm --   Heart Rate (Exit) 65 bpm 79 bpm 67 bpm 74 bpm --   Oxygen Saturation (Admit) 93 % 95 % 95 % 92 % --   Oxygen Saturation (Exercise) 87 % 92 % 92 % 90 % --   Oxygen Saturation (Exit) 94 % 94 % 98 % 95 % --   Rating of Perceived Exertion (Exercise) _0 --   Perceived Dyspnea (Exercise) _1 --   Symptoms SOB, right hip tightness SOB SOB SOB --   Comments walk test results -- -- -- --   Duration -- Progress to 30 minutes of  aerobic without signs/symptoms of physical distress Continue  with 30 min of aerobic exercise without signs/symptoms of physical distress. Continue with 30 min of aerobic exercise without signs/symptoms of physical distress. --   Intensity -- THRR unchanged THRR unchanged THRR unchanged --     Progression   Progression -- Continue to progress workloads to maintain intensity without signs/symptoms of physical distress. Continue to progress workloads to maintain intensity without signs/symptoms of physical distress. Continue to progress workloads to maintain intensity without signs/symptoms of physical distress. --   Average METs -- 2.37 2.49 2.53 --     Resistance Training   Training Prescription -- Yes Yes Yes --  Weight -- 3 lb 3 lb 3 lb --   Reps -- 10-15 10-15 10-15 --     Interval Training   Interval Training -- No No No --     Oxygen   Oxygen -- Continuous Continuous Continuous --   Liters -- _0 --     Treadmill   MPH -- 2 2.2 2 --   Grade -- 0 0 0 --   Minutes -- _1 --   METs -- 2.53 2.68 2.38 --     NuStep   Level -- _2 --   Minutes -- _3 --   METs -- 2.3 2.3 3 --     REL-XR   Level -- _4 --   Minutes -- _5 --   METs -- 3 2.5 2 --     Home Exercise Plan   Plans to continue exercise at -- -- -- -- [facility] [employer] (comment)  Neurosurgeon   Frequency -- -- -- -- Add 2 additional days to program exercise sessions.   Initial Home Exercises Provided -- -- -- -- 07/17/22     Oxygen   Maintain Oxygen Saturation -- 88% or higher 88% or higher 88% or higher 88% or higher    Row Name 07/17/22 1500 08/01/22 1500           Response to Exercise   Blood Pressure (Admit) 100/58 102/58      Blood Pressure (Exit) 124/64 102/60      Heart Rate (Admit) 74 bpm 67 bpm      Heart Rate (Exercise) 90 bpm 91 bpm      Heart Rate (Exit) 82 bpm 69 bpm      Oxygen Saturation (Admit) 94 % 92 %      Oxygen Saturation (Exercise) 92 % 91 %      Oxygen Saturation (Exit) 95 % 94 %      Rating of Perceived Exertion  (Exercise) 13 15      Perceived Dyspnea (Exercise) 2 3      Symptoms SOB SOB      Duration Continue with 30 min of aerobic exercise without signs/symptoms of physical distress. Continue with 30 min of aerobic exercise without signs/symptoms of physical distress.      Intensity THRR unchanged THRR unchanged        Progression   Progression Continue to progress workloads to maintain intensity without signs/symptoms of physical distress. Continue to progress workloads to maintain intensity without signs/symptoms of physical distress.      Average METs 3.06 3.04        Resistance Training   Training Prescription Yes Yes      Weight 3 lb 3 lb      Reps 10-15 10-15        Interval Training   Interval Training No No        Oxygen   Oxygen Continuous Continuous      Liters 3 2-3        Treadmill   MPH 2 2.2      Grade 1.5 1      Minutes 15 15      METs 2.81 2.99        Recumbant Bike   Level -- 2      Minutes -- 15      METs -- 2        NuStep   Level 7 7  Minutes 15 15      METs 3.6 3.6        REL-XR   Level 7 6      Minutes 15 15      METs 3.5 3.4        T5 Nustep   Level -- 6      Minutes -- 15      METs -- 2.3        Home Exercise Plan   Plans to continue exercise at [employer] (comment)  [employer], [employer]ing (comment)  [employer]      Frequency Add 2 additional days to program exercise sessions. Add 2 additional days to program exercise sessions.      Initial Home Exercises Provided 07/17/22 07/17/22        Oxygen   Maintain Oxygen Saturation 88% or higher 88% or higher               Exercise Comments:   Exercise Goals and Review:   Exercise Goals     Row Name 05/23/22 1456             Exercise Goals   Increase Physical Activity Yes       Intervention Provide advice, education, support and counseling about physical activity/exercise needs.;Develop an individualized exercise prescription for aerobic and resistive  training based on initial evaluation findings, risk stratification, comorbidities and participant's personal goals.       Expected Outcomes Short Term: Attend rehab on a regular basis to increase amount of physical activity.;Long Term: Add in home exercise to make exercise part of routine and to increase amount of physical activity.;Long Term: Exercising regularly at least 3-5 days a week.       Increase Strength and Stamina Yes       Intervention Provide advice, education, support and counseling about physical activity/exercise needs.;Develop an individualized exercise prescription for aerobic and resistive training based on initial evaluation findings, risk stratification, comorbidities and participant's personal goals.       Expected Outcomes Short Term: Increase workloads from initial exercise prescription for resistance, speed, and METs.;Short Term: Perform resistance training exercises routinely during rehab and add in resistance training at home;Long Term: Improve cardiorespiratory fitness, muscular endurance and strength as measured by increased METs and functional capacity (6MWT)       Able to understand and use rate of perceived exertion (RPE) scale Yes       Intervention Provide education and explanation on how to use RPE scale       Expected Outcomes Short Term: Able to use RPE daily in rehab to express subjective intensity level;Long Term:  Able to use RPE to guide intensity level when exercising independently       Able to understand and use Dyspnea scale Yes       Intervention Provide education and explanation on how to use Dyspnea scale       Expected Outcomes Short Term: Able to use Dyspnea scale daily in rehab to express subjective sense of shortness of breath during exertion;Long Term: Able to use Dyspnea scale to guide intensity level when exercising independently       Knowledge and understanding of Target Heart Rate Range (THRR) Yes       Intervention Provide education and  explanation of THRR including how the numbers were predicted and where they are located for reference       Expected Outcomes Short Term: Able to state/look up THRR;Long Term: Able to use THRR  to govern intensity when exercising independently;Short Term: Able to use daily as guideline for intensity in rehab       Able to check pulse independently Yes       Intervention Provide education and demonstration on how to check pulse in carotid and radial arteries.;Review the importance of being able to check your own pulse for safety during independent exercise       Expected Outcomes Short Term: Able to explain why pulse checking is important during independent exercise;Long Term: Able to check pulse independently and accurately       Understanding of Exercise Prescription Yes       Intervention Provide education, explanation, and written materials on patient's individual exercise prescription       Expected Outcomes Short Term: Able to explain program exercise prescription;Long Term: Able to explain home exercise prescription to exercise independently                Exercise Goals Re-Evaluation :  Exercise Goals Re-Evaluation     Row Name 05/24/22 1010 06/06/22 1416 06/20/22 1434 07/04/22 1416 07/17/22 1011     Exercise Goal Re-Evaluation   Exercise Goals Review -- Increase Physical Activity;Increase Strength and Stamina;Understanding of Exercise Prescription Increase Physical Activity;Increase Strength and Stamina;Understanding of Exercise Prescription Increase Physical Activity;Increase Strength and Stamina;Understanding of Exercise Prescription Increase Physical Activity;Increase Strength and Stamina;Understanding of Exercise Prescription   Comments Reviewed RPE and dyspnea scales, THR and program prescription with pt today.  Pt voiced understanding and was given a copy of goals to take home. Yasaman is doing well with rehab. She had an overall average MET level of 2.37 METs. She also increased her  speed on the treadmill up to 2 mph. She also improved to level 3 on the T4. We will continue to monitor her progress in the program. Ramonica continues to do well in rehab. She did increase her treadmill speed to 2.2 mph. her oxygen saturations are staying above 88% with exercise. She has been at a consistent level 1 on the XR and would benefit from increasing that. We will continue to monitor. Jariya continues to do well in rehab. She recently increased her average overall MET level to 2.53 METs. She also improved to level 4 on the T4 and level 6 on the XR. We will continue to monitor her progress in the program. --   Expected Outcomes Short: Use RPE daily to regulate intensity. Long: Follow program prescription in THR. Short: Begin to add incline on the treadmill. Long: Continue to increase strength and stamina. Short: Increase level on XR Long: Continue to increase overall MET level Short: Continue to increase workloads as tolerated. Long: Continue to increase strength and stamina. --    Row Name 07/17/22 1052 07/17/22 1517 08/01/22 1509 08/14/22 0953       Exercise Goal Re-Evaluation   Exercise Goals Review Increase Physical Activity;Increase Strength and Stamina;Understanding of Exercise Prescription;Able to understand and use rate of perceived exertion (RPE) scale;Able to check pulse independently;Knowledge and understanding of Target Heart Rate Range (THRR) Increase Physical Activity;Increase Strength and Stamina;Understanding of Exercise Prescription Increase Physical Activity;Increase Strength and Stamina;Understanding of Exercise Prescription Increase Physical Activity;Increase Strength and Stamina;Understanding of Exercise Prescription    Comments Reviewed home exercise with pt today.  Pt plans to go to [employer] for exercise. Right now, she is going 2 days in addition to rehab/ week. She does a mix of treadmill, bike, arm crank, and seated steppers. She does more of her bodyweight strength  training at home as she states the machines at the gym are always used. Reviewed THR, pulse, RPE, sign and symptoms, pulse oximetery and when to call 911 or MD.  Also discussed weather considerations and indoor options.  Pt voiced understanding. Jazell continues to do well in rehab. She increased to level 7 on both the T4 Nustep and and XR. She also added a 1.5% incline to her treadmill. All in all, her overall METs increased to over 3! She would benefit from increasing to 4 lb handweights. Will continue to monitor. Erick is doing well in rehab. She is due for her post 6MWT and will look to improve on that. She also has continued to work at an average MET level above 3 METs. She was able to increase her speed on the treadmill to 2.2 mph while maintaining an incline of 1% as well. We will continue to monitor her progress in the program. Adara has been out for the last couple of weeks due to being sick and therefore has not been here since last review. Her son had RSV and she did not get it, but still had sick symptoms so therefore she has not been able to work out. Prior to this, she was going to [employer], up to 6 days/ week. [place]oes a mix of cardio and resistance training. She is going to slowly ease back into her exercise routine since she has been down and sick for 2 weeks. She is starting off with rehab sessions this week and then slowly starting to introduce a day at a time with [employer]. She knows to listen to her body. She is also due for her post 6MWT next week and we hope to see improvement.    Expected Outcomes Short: Continue with 2 days of exercise at home, continue to watch O2 and HR during Long: Continue to exercise independently Short: Increase to 4 lb handweights Long: Continue to increase overall MET level Short: Improve on post 6MWT. Long: Continue to increase strength and stamina. Short: Ease back into [employer] routine Long: Graduate from [employer] and continue to exercise  independently at home             Discharge Exercise Prescription (Final Exercise Prescription Changes):  Exercise Prescription Changes - 08/01/22 1500       Response to Exercise   Blood Pressure (Admit) 102/58    Blood Pressure (Exit) 102/60    Heart Rate (Admit) 67 bpm    Heart Rate (Exercise) 91 bpm    Heart Rate (Exit) 69 bpm    Oxygen Saturation (Admit) 92 %    Oxygen Saturation (Exercise) 91 %    Oxygen Saturation (Exit) 94 %    Rating of Perceived Exertion (Exercise) 15    Perceived Dyspnea (Exercise) 3    Symptoms SOB    Duration Continue with 30 min of aerobic exercise without signs/symptoms of physical distress.    Intensity THRR unchanged      Progression   Progression Continue to progress workloads to maintain intensity without signs/symptoms of physical distress.    Average METs 3.04      Resistance Training   Training Prescription Yes    Weight 3 lb    Reps 10-15      Interval Training   Interval Training No      Oxygen   Oxygen Continuous    Liters 2-3      Treadmill   MPH 2.2    Grade 1  Minutes 15    METs 2.99      Recumbant Bike   Level 2    Minutes 15    METs 2      NuStep   Level 7    Minutes 15    METs 3.6      REL-XR   Level 6    Minutes 15    METs 3.4      T5 Nustep   Level 6    Minutes 15    METs 2.3      Home Exercise Plan   Plans to continue exercise at [employer] (comment)   [employer]   Frequency Add 2 additional days to program exercise sessions.    Initial Home Exercises Provided 07/17/22      Oxygen   Maintain Oxygen Saturation 88% or higher             Nutrition:  Target Goals: Understanding of nutrition guidelines, daily intake of sodium <1576m, cholesterol <2063m calories 30% from fat and 7% or less from saturated fats, daily to have 5 or more servings of fruits and vegetables.  Education: All About Nutrition: -Group instruction provided by verbal, written material, interactive  activities, discussions, models, and posters to present general guidelines for heart healthy nutrition including fat, fiber, MyPlate, the role of sodium in heart healthy nutrition, utilization of the nutrition label, and utilization of this knowledge for meal planning. Follow up email sent as well. Written material given at graduation. [facility]  Date 07/26/22  [facility] Code 1- Verbalizes Understanding       Biometrics:  Pre Biometrics - 05/23/22 1449       Pre Biometrics   Height 5' 4.6" (1.641 m)    Weight 186 lb 6.4 oz (84.6 kg)    BMI (Calculated) 31.4    Single Leg Stand 7.3 seconds             Post Biometrics - 08/23/22 1010        Post  Biometrics   Height 5' 4.6" (1.641 m)    Weight 191 lb 6.4 oz (86.8 kg)    BMI (Calculated) 32.24    Single Leg Stand 3.8 seconds             Nutrition Therapy Plan and Nutrition Goals:  Nutrition Therapy & Goals - 06/05/22 1301       Nutrition Therapy   Diet Heart healthy, low Na, T2DM MNT, Pulmonary MNT    Protein (specify units) 95-100g    Fiber 28 grams    Whole Grain Foods 3 servings    Saturated Fats 15 max. grams    Fruits and Vegetables 8 servings/day    Sodium 1.5 grams      Personal Nutrition Goals   Nutrition Goal ST: practice MyPlate guidelines, try out sourdough bread or whole grain bakery bread LT: meet protein/energy needs, maintain A1c < 7, limit Na <1.5g/day, meet fiber needs of at least 28g/day    Comments [age].o. F admitted to pulmonary rehab for COPD. PMHx includes GERD, T2DM (recent A1C 6.2), OSA, osteoporosis, anxiety, arthritis, hard of hearing with hearing aids, CKD (no stage on file). PSHx cholecystectomy, rotator cuff repair, THA. Relevant medications includes xanax, lipitor, wellbutrin, vit D3 and ergocalciferol, cranberry tablets, farxiga, flovent, furosemide, magnesium, metformin, metolazone, MVI with  minerals, macrobid, 3L O2, omeprazole, K+, prednisone, simethicone, torsemide. PYP Score: 68. Vegetables & Fruits 7/12.  Breads, Grains & Cereals 8/12. Red & Processed Meat 8/12. Poultry 2/2. Fish & Shellfish 1/4. Beans, Nuts & Seeds 2/4. Milk & Dairy Foods 3/6. Toppings, Oils, Seasonings & Salt 13/20. Sweets, Snacks & Restaurant Food 14/14. Beverages 10/10.  Today had leftover asparagus with salami and egg with bread roll. she also enjoys eggs on toast (arnold white bread - this is due to price as she does not feel the whole wheat is good quality. She would be open to getting bakery sourdough bread - she likes [employer]. She will take her fluid pill later in the day when she has to run errands, has plans, or when she comes to rehab which can cause her to be up all night. When she goes out to eat at an [place] instead of fast food. She will go out to eat at least 2x/week (once with her son and his wife). She prefers to make food at home. She reports having at least 1 vegetable serving per day and sometimes with have shredded carrots or cababge with sugar and lemon for dessert. She reports being mindful of her sodium and does not use any with cooking.  Reviewed heart healthy eating, diabetes friendly eating, and pulmonary MNT.      Intervention Plan   Intervention Prescribe, educate and counsel regarding individualized specific dietary modifications aiming towards targeted core components such as weight, hypertension, lipid management, diabetes, heart failure and other comorbidities.    Expected Outcomes Short Term Goal: Understand basic principles of dietary content, such as calories, fat, sodium, cholesterol and nutrients.;Short Term Goal: A plan has been developed with personal nutrition goals set during dietitian appointment.;Long Term Goal: Adherence to prescribed nutrition plan.             Nutrition Assessments:  MEDIFICTS Score Key: ?70 Need to make dietary changes  40-70  Heart Healthy Diet ? 40 Therapeutic Level Cholesterol Diet  Flowsheet Row Pulmonary Rehab from [facility]/23/2022 in [facility]  Picture Your Plate Total Score on Admission 68      Picture Your Plate Scores: <42 Unhealthy dietary pattern with much room for improvement. 41-50 Dietary pattern unlikely to meet recommendations for good health and room for improvement. 51-60 More healthful dietary pattern, with some room for improvement.  >60 Healthy dietary pattern, although there may be some specific behaviors that could be improved.   Nutrition Goals Re-Evaluation:  Nutrition Goals Re-Evaluation     [place] Name 06/16/22 1013 07/17/22 0955 07/25/22 1033 08/14/22 1005       Goals   Current Weight 190 lb (86.2 kg) 190 lb (86.2 kg) -- --    Nutrition Goal Lose some weight ST: practice MyPlate guidelines, try out sourdough bread or whole grain bakery bread LT: meet protein/energy needs, maintain A1c < 7, limit Na <1.5g/day, meet fiber needs of at least 28g/day ST: Modify meals using ideas from this RD - specifics in comment LT: meet protein/energy needs, maintain A1c < 7, limit Na <1.5g/day, meet fiber needs of at least 28g/day ST: Modify meals using ideas from this RD - specifics in comment LT: meet protein/energy needs, maintain A1c < 7, limit Na <1.5g/day, meet fiber needs of at least 28g/day    Comment Patient was informed on why it is important to maintain a balanced diet when dealing with Respiratory issues. Explained that it takes a lot of energy to breath and when they are short of breath often they will need to have a good diet to help  keep up with the calories they are expending for breathing. Shandelle states she is frustrated because she has gained at least 3 lb, although she has been trying to lose. Currently, she is watching sodium intake by looking at labels and is trying to eat  more fruit as snacks to keep her satieted. Specifically - banaanas, apples, pomegrantate whic she is  even putting on her on salads. She does have any appt with the RD again on 11/28 to discuss further details. Siren has made changes since first meeting with this RD including snacking on fruit and using whole wheat and sourdough bread. She would like to lose weight - discusssed behavior changes that can help and discussed swaps and modifications to her current meals. New food recall: B: 1/3 cup honey bunches of oat cereal with whole milk and fruit or an egg with butter on 1 slice of whole wheat or sourdough toast sometimes with peppers and onions and a glass of OJ. Recommended switching to a higher fiber cereal - suggested Kashi (honey almond flax crunch) as this would be similar in crunch and flavor with more fiber, protein, and some heart healthy fats. Encouraged Elnora to have at least 1/2 cup of cereal instead of 1/3 cup which can cause her to be hungry soon after; a serving size is 3/4 cup  of the Kashhi cereal. Suggested Harla choose a piece of whole fruit instead of OJ at least half of the time as this will provide more fiber and less sugar per serving. Encouraged Jakiyah to include non-starchy vegetables like peppers and onions most of the time instead of some of the time. L: sandwich made with [place] cheese (she is unsure of the type of cheese this is, but likens it to [employer]), 2 hot dogs and a slice of bread, salad with an egg and ranch or blue cheese dressing, or homemade soup such as potato soup (uses hot dogsin this soup) or chili (made with pork loin and beans). Suggested a tuna sandwich (limit to 1-2x/week) or a low-sodium [place] sanndwich on whole wheat bread for an easy meal instead of hot dogs. Suggested using a small amount of real blue cheese and then dressed her salad with olive oil and vinegar/lemon or use low sodium [place] dressing as she likes that as well. Suggested pairing her soup with some non-starchy vegetables such as cabbage, broccoli, salad greens, etc. D: She normally has a  protein and vegetable or grain. Protein such as chicken (airfryer), steak and fish with butter in a pan, pork chops. Vegetables such as potatoes, sweet potatoes with butter and brown sugar, corn, red cabbage, and peas. Grains such as egg noodles. Discussed MyPlate proportions, advised to choose lean proteins like fish and chicken more often than red meat, use mostly liquid plant oils instead of butter with cooking, and to vary non-starchy vegetables. Suggested snacks like nuts/seeds with her fruit or 1/2 sandiwch on whole wheat bread to include fiber, healthy fat, and protein helping to keep her satiated during the day. Paraskevi admits she has not made any huge diet changes that was discussed with the RD as she has not made it a priority and states she has had a lot going on. She is still trying to lose weight but has not exercised lately due to being sick. She did state she has been focusing a lot on her salt intake and monitors her sodium closely. She can tell if she obtains fluid, and knows to watch her weight diligently  for any potentialy fluid gain. She takes her diuretic. Her sugar is always elevated she thinks from medication side effects. She was encouraged to work on 1 goal established by the RD as there were many discuseed.    Expected Outcome Short: Choose and plan snacks accordingly to patients caloric intake to improve breathing. Long: Maintain a diet independently that meets their caloric intake to aid in daily shortness of breath. Short: Talk with RD on 11/28 Long: Continue to eat a healthy pulmonary based diet ST: Modify meals using ideas from this RD - specifics in comment LT: meet protein/energy needs, maintain A1c < 7, limit Na <1.5g/day, meet fiber needs of at least 28g/day Short: Start working on 1 RD goal and notify staff of any changes Long: Continue to follow healthy pulmonary based diet             Nutrition Goals Discharge (Final Nutrition Goals Re-Evaluation):  Nutrition Goals  Re-Evaluation - 08/14/22 1005       Goals   Nutrition Goal ST: Modify meals using ideas from this RD - specifics in comment LT: meet protein/energy needs, maintain A1c < 7, limit Na <1.5g/day, meet fiber needs of at least 28g/day    Comment Cecila admits she has not made any huge diet changes that was discussed with the RD as she has not made it a priority and states she has had a lot going on. She is still trying to lose weight but has not exercised lately due to being sick. She did state she has been focusing a lot on her salt intake and monitors her sodium closely. She can tell if she obtains fluid, and knows to watch her weight diligently for any potentialy fluid gain. She takes her diuretic. Her sugar is always elevated she thinks from medication side effects. She was encouraged to work on 1 goal established by the RD as there were many discuseed.    Expected Outcome Short: Start working on 1 RD goal and notify staff of any changes Long: Continue to follow healthy pulmonary based diet             Psychosocial: Target Goals: Acknowledge presence or absence of significant depression and/or stress, maximize coping skills, provide positive support system. Participant is able to verbalize types and ability to use techniques and skills needed for reducing stress and depression.   Education: Stress, Anxiety, and Depression - Group verbal and visual presentation to define topics covered.  Reviews how body is impacted by stress, anxiety, and depression.  Also discusses healthy ways to reduce stress and to treat/manage anxiety and depression.  Written material given at graduation. Flowsheet Row Pulmonary Rehab from 08/23/2022 in [facility] Cardiac and Pulmonary Rehab  Date 06/21/22  Educator [facility]  Instruction Review Code 1- [employer] Understanding       Education: Sleep Hygiene -Provides group verbal and written instruction about how sleep can affect your health.  Define sleep hygiene, discuss sleep  cycles and impact of sleep habits. Review good sleep hygiene tips.    Initial Review & Psychosocial Screening:  Initial Psych Review & Screening - 05/15/22 1054       Initial Review   Current issues with Current Psychotropic Meds      Family Dynamics   Good Support System? Yes    Comments She took WellButrin for awhile to quit smoking and she did. She has a good family support system.      Barriers   Psychosocial barriers to participate in program The  patient should benefit from training in stress management and relaxation.      Screening Interventions   Interventions Encouraged to exercise;To provide support and resources with identified psychosocial needs;Provide feedback about the scores to participant    Expected Outcomes Short Term goal: Utilizing psychosocial counselor, staff and physician to assist with identification of specific Stressors or current issues interfering with healing process. Setting desired goal for each stressor or current issue identified.;Long Term Goal: Stressors or current issues are controlled or eliminated.;Short Term goal: Identification and review with participant of any Quality of Life or Depression concerns found by scoring the questionnaire.;Long Term goal: The participant improves quality of Life and PHQ9 Scores as seen by post scores and/or verbalization of changes             Quality of Life Scores:  Scores of 19 and below usually indicate a poorer quality of life in these areas.  A difference of  2-3 points is a clinically meaningful difference.  A difference of 2-3 points in the total score of the Quality of Life Index has been associated with significant improvement in overall quality of life, self-image, physical symptoms, and general health in studies assessing change in quality of life.  PHQ-9: Review Flowsheet  More data exists      07/10/2022 06/16/2022 05/23/2022 05/19/2022 05/18/2022  Depression screen PHQ 2/9  Decreased Interest 0 0 1  0 0  Down, Depressed, Hopeless 1 0 0 0 0  PHQ - 2 Score 1 0 1 0 0  Altered sleeping 0 1 3 - -  Tired, decreased energy _0 - -  Change in appetite _1 - -  Feeling bad or failure about yourself  0 0 0 - -  Trouble concentrating 0 0 2 - -  Moving slowly or fidgety/restless 0 0 0 - -  Suicidal thoughts 0 0 0 - -  PHQ-9 Score _2 - -  Difficult doing work/chores Not difficult at all Somewhat difficult Not difficult at all - -   Interpretation of Total Score  Total Score Depression Severity:  1-4 = Minimal depression, 5-9 = Mild depression, 10-14 = Moderate depression, 15-19 = Moderately severe depression, 20-27 = Severe depression   Psychosocial Evaluation and Intervention:  Psychosocial Evaluation - 05/15/22 1056       Psychosocial Evaluation & Interventions   Interventions Encouraged to exercise with the program and follow exercise prescription;Stress management education;Relaxation education    Comments She took WellButrin for awhile to quit smoking and she did. She has a good family support system.    Expected Outcomes Short: Start [facility] to help with mood. Long: Maintain a healthy mental state.    Continue Psychosocial Services  Follow up required by staff             Psychosocial Re-Evaluation:  Psychosocial Re-Evaluation     [place] Name 06/16/22 1024 07/17/22 1003 08/14/22 1014         Psychosocial Re-Evaluation   Current issues with Current Stress Concerns;Current Psychotropic Meds;Current Anxiety/Panic Current Psychotropic Meds;Current Anxiety/Panic;Current Stress Concerns Current Psychotropic Meds;Current Anxiety/Panic;Current Stress Concerns     Comments Reviewed patient health questionnaire (PHQ-9) with patient for follow up. Previously, patients score indicated signs/symptoms of depression.  Reviewed to see if patient is improving symptom wise while in program.  Score improved and patient states that it is because she has been able to spend more time with  her great gand daughter. Kortny is doing well mentally- does  not feel she has had any problems. Fortunately, her PHQ went down from 7 to 4 (it was an 11 to begin with)! She has been on Wellbutrin as a new medication.. She is taking all her other medications as prescribed. Her sleep is good, though she states she can't sleep when there is a a full moon. Told patient if there are any changes to let us know. She is frustrated that her cardiologist is saying her SOB is lung related, when her pulmonologist is saying it is cardiac related. She recently had an MRI completed to check her valves which she was told are good. As of now, they want to continue what they're doing- and she wants more answers. She is looking to get a new Pulmonologist to get a 2nd opinion. Mahitha is in the recovery phase from being sick as her son had RSV and she had symptoms from. He was in the hospital with low O2 saturations but has been better and she is relieved. She has been not doing much with low energy but motivated to get to where she was. She had a MRI in which she was told sje had a stroke at one point in the past but otherwise, her heart and valves are working good. Her doctors told her her SOB is mostly from her lung damage. She is still complaining about some chest pressure that she has been experiencing (this is chronic and has been going on for years) and sees her cardiologist on Friday which is where she will talk to them about it again. She is still taking all of her medcations which feels are working well. She recently went to [place] with her family and grandchildren and had a blast. She is staying busy with the holidays coming up.     Expected Outcomes Short: Continue to attend [facility] regularly for regular exercise and social engagement. Long: Continue to improve symptoms and manage a positive mental state. Short: Find new pulmonologist per patient request Long: Continue to utilize exercise for stress management and  maintain positive attitude Short: Continue coming to rehab consistently Long: Continue to maintain positive attitude     Interventions Encouraged to attend Pulmonary Rehabilitation for the exercise Encouraged to attend Pulmonary Rehabilitation for the exercise Encouraged to attend Pulmonary Rehabilitation for the exercise     Continue Psychosocial Services  Follow up required by staff Follow up required by staff Follow up required by staff              Psychosocial Discharge (Final Psychosocial Re-Evaluation):  Psychosocial Re-Evaluation - 08/14/22 1014       Psychosocial Re-Evaluation   Current issues with Current Psychotropic Meds;Current Anxiety/Panic;Current Stress Concerns    Comments Desarea is in the recovery phase from being sick as her son had RSV and she had symptoms from. He was in the hospital with low O2 saturations but has been better and she is relieved. She has been not doing much with low energy but motivated to get to where she was. She had a MRI in which she was told sje had a stroke at one point in the past but otherwise, her heart and valves are working good. Her doctors told her her SOB is mostly from her lung damage. She is still complaining about some chest pressure that she has been experiencing (this is chronic and has been going on for years) and sees her cardiologist on Friday which is where she will talk to them about it again. She  is still taking all of her medcations which feels are working well. She recently went to [place] with her family and grandchildren and had a blast. She is staying busy with the holidays coming up.    Expected Outcomes Short: Continue coming to rehab consistently Long: Continue to maintain positive attitude    Interventions Encouraged to attend Pulmonary Rehabilitation for the exercise    Continue Psychosocial Services  Follow up required by staff             Education: Education Goals: Education classes will be provided on a  weekly basis, covering required topics. Participant will state understanding/return demonstration of topics presented.  Learning Barriers/Preferences:  Learning Barriers/Preferences - 05/15/22 1052       Learning Barriers/Preferences   Learning Barriers Hearing    Learning Preferences None             General Pulmonary Education Topics:  Infection Prevention: - Provides verbal and written material to individual with discussion of infection control including proper hand washing and proper equipment cleaning during exercise session. Flowsheet Row Pulmonary Rehab from 08/23/2022 in [facility] Cardiac and Pulmonary Rehab  Date 05/15/22  Educator [facility]  Instruction Review Code 1- Verbalizes Understanding       Falls Prevention: - Provides verbal and written material to individual with discussion of falls prevention and safety. Flowsheet Row Pulmonary Rehab from 08/23/2022 in [facility] Cardiac and Pulmonary Rehab  Date 05/15/22  Educator [facility]  Instruction Review Code 1- Verbalizes Understanding       Chronic Lung Disease Review: - Group verbal instruction with posters, models, PowerPoint presentations and videos,  to review new updates, new respiratory medications, new advancements in procedures and treatments. Providing information on websites and "800" numbers for continued self-education. Includes information about supplement oxygen, available portable oxygen systems, continuous and intermittent flow rates, oxygen safety, concentrators, and Medicare reimbursement for oxygen. Explanation of Pulmonary Drugs, including class, frequency, complications, importance of spacers, rinsing mouth after steroid MDI's, and proper cleaning methods for nebulizers. Review of basic lung anatomy and physiology related to function, structure, and complications of lung disease. Review of risk factors. Discussion about methods for diagnosing sleep apnea and types of masks and machines for OSA. Includes a review of the  use of types of environmental controls: home humidity, furnaces, filters, dust mite/pet prevention, HEPA vacuums. Discussion about weather changes, air quality and the benefits of nasal washing. Instruction on Warning signs, infection symptoms, calling MD promptly, preventive modes, and value of vaccinations. Review of effective airway clearance, coughing and/or vibration techniques. Emphasizing that all should Create an Action Plan. Written material given at graduation. Flowsheet Row Pulmonary Rehab from 08/23/2022 in [facility] Cardiac and Pulmonary Rehab  Education need identified 05/23/22  Date 08/23/22  Educator [facility]  Instruction Review Code 1- Verbalizes Understanding       AED/CPR: - Group verbal and written instruction with the use of models to demonstrate the basic use of the AED with the basic ABC's of resuscitation.    Anatomy and Cardiac Procedures: - Group verbal and visual presentation and models provide information about basic cardiac anatomy and function. Reviews the testing methods done to diagnose heart disease and the outcomes of the test results. Describes the treatment choices: Medical Management, Angioplasty, or Coronary Bypass Surgery for treating various heart conditions including Myocardial Infarction, Angina, Valve Disease, and Cardiac Arrhythmias.  Written material given at graduation.   Medication Safety: - Group verbal and visual instruction to review commonly prescribed medications for  heart and lung disease. Reviews the medication, class of the drug, and side effects. Includes the steps to properly store meds and maintain the prescription regimen.  Written material given at graduation. Flowsheet Row Pulmonary Rehab from 08/23/2022 in [facility] Cardiac and Pulmonary Rehab  Date 05/31/22  Educator [facility]  Instruction Review Code 1- Verbalizes Understanding       Other: -Provides group and verbal instruction on various topics (see comments)   Knowledge Questionnaire  Score:    Core Components/Risk Factors/Patient Goals at Admission:  Personal Goals and Risk Factors at Admission - 05/23/22 1459       Core Components/Risk Factors/Patient Goals on Admission    Weight Management Yes;Weight Loss;Obesity    Intervention Weight Management: Develop a combined nutrition and exercise program designed to reach desired caloric intake, while maintaining appropriate intake of nutrient and fiber, sodium and fats, and appropriate energy expenditure required for the weight goal.;Weight Management: Provide education and appropriate resources to help participant work on and attain dietary goals.;Weight Management/Obesity: Establish reasonable short term and long term weight goals.;Obesity: Provide education and appropriate resources to help participant work on and attain dietary goals.    Admit Weight 186 lb 6.4 oz (84.6 kg)    Goal Weight: Short Term 181 lb (82.1 kg)    Goal Weight: Long Term 175 lb (79.4 kg)    Expected Outcomes Short Term: Continue to assess and modify interventions until short term weight is achieved;Long Term: Adherence to nutrition and physical activity/exercise program aimed toward attainment of established weight goal;Weight Loss: Understanding of general recommendations for a balanced deficit meal plan, which promotes 1-2 lb weight loss per week and includes a negative energy balance of [phone] kcal/d;Understanding recommendations for meals to include 15-35% energy as protein, 25-35% energy from fat, 35-60% energy from carbohydrates, less than 259m of dietary cholesterol, 20-35 gm of total fiber daily;Understanding of distribution of calorie intake throughout the day with the consumption of 4-5 meals/snacks    Tobacco Cessation Yes   Quit May 2023   Number of packs per day 0    Intervention Assist the participant in steps to quit. Provide individualized education and counseling about committing to Tobacco Cessation, relapse prevention, and  pharmacological support that can be provided by physician.;OAdvice worker assist with locating and accessing local/national Quit Smoking programs, and support quit date choice.    Expected Outcomes Short Term: Will demonstrate readiness to quit, by selecting a quit date.;Short Term: Will quit all tobacco product use, adhering to prevention of relapse plan.;Long Term: Complete abstinence from all tobacco products for at least 12 months from quit date.    Improve shortness of breath with ADL's Yes    Intervention Provide education, individualized exercise plan and daily activity instruction to help decrease symptoms of SOB with activities of daily living.    Expected Outcomes Short Term: Improve cardiorespiratory fitness to achieve a reduction of symptoms when performing ADLs;Long Term: Be able to perform more ADLs without symptoms or delay the onset of symptoms    Diabetes Yes    Intervention Provide education about signs/symptoms and action to take for hypo/hyperglycemia.;Provide education about proper nutrition, including hydration, and aerobic/resistive exercise prescription along with prescribed medications to achieve blood glucose in normal ranges: Fasting glucose 65-99 mg/dL    Expected Outcomes Short Term: Participant verbalizes understanding of the signs/symptoms and immediate care of hyper/hypoglycemia, proper foot care and importance of medication, aerobic/resistive exercise and nutrition plan for blood glucose control.;Long Term: Attainment of HbA1C < 7%.  Heart Failure Yes    Intervention Provide a combined exercise and nutrition program that is supplemented with education, support and counseling about heart failure. Directed toward relieving symptoms such as shortness of breath, decreased exercise tolerance, and extremity edema.    Expected Outcomes Improve functional capacity of life;Short term: Attendance in program 2-3 days a week with increased exercise capacity. Reported  lower sodium intake. Reported increased fruit and vegetable intake. Reports medication compliance.;Short term: Daily weights obtained and reported for increase. Utilizing diuretic protocols set by physician.;Long term: Adoption of self-care skills and reduction of barriers for early signs and symptoms recognition and intervention leading to self-care maintenance.             Education:Diabetes - Individual verbal and written instruction to review signs/symptoms of diabetes, desired ranges of glucose level fasting, after meals and with exercise. Acknowledge that pre and post exercise glucose checks will be done for 3 sessions at entry of program. Flowsheet Row Pulmonary Rehab from 08/23/2022 in [facility] Cardiac and Pulmonary Rehab  Date 05/15/22  Educator [facility]  Instruction Review Code 1- Verbalizes Understanding       Know Your Numbers and Heart Failure: - Group verbal and visual instruction to discuss disease risk factors for cardiac and pulmonary disease and treatment options.  Reviews associated critical values for Overweight/Obesity, Hypertension, Cholesterol, and Diabetes.  Discusses basics of heart failure: signs/symptoms and treatments.  Introduces Heart Failure Zone chart for action plan for heart failure.  Written material given at graduation. Flowsheet Row Pulmonary Rehab from 08/23/2022 in [facility] Cardiac and Pulmonary Rehab  Date 06/07/22  Educator SB  Instruction Review Code 1- Verbalizes Understanding       Core Components/Risk Factors/Patient Goals Review:   Goals and Risk Factor Review     Row Name 06/16/22 1014 07/17/22 0959 08/14/22 1006         Core Components/Risk Factors/Patient Goals Review   Personal Goals Review Improve shortness of breath with ADL's Improve shortness of breath with ADL's;Tobacco Cessation;Weight Management/Obesity;Diabetes Improve shortness of breath with ADL's;Tobacco Cessation;Weight Management/Obesity;Diabetes     Review Spoke to patient about  their shortness of breath and what they can do to improve. Patient has been informed of breathing techniques when starting the program. Patient is informed to tell staff if they have had any med changes and that certain meds they are taking or not taking can be causing shortness of breath. Lanessa states she has gained weight, and since she has felt a little more SOB. She is talking with the RD on 11/28 to discuss further as she has been trying to lose. She states she is inpatient with the breathing but knows staying consistent will help. She did quit smoking in in May (smoked since she was 40) and says she has felt better since. Her doctor put her on Wellbutrin to help with cravings. She had to stop taking metformin as there was a  contraindication with one of her other medications. She does take all of her other medications as instructed. She check her sugars at home which  average 130-140 fasting. Doctor is OK with it and tells her to keep a log. Her doctor told her she takes seveal medications that may increase her sugar. Martena states she has not really lost weight much weight but admits she has not exercise as much because she has been sick. She recently had a MRI completed and her MD concluded that her SOB is mostly from lung damage, and her heart workup was  good. She wants to ease back into exercise again slowly and thinks that will help with her breathing. Continues to not smoke. She checks her blood sugar every morning and still ranges 130-140, though she thinks its side effect from meidcations. She has dicussed this with her doctor. She also recently finished being on prednisone which could've sky rocketed it as well, She is taking all of her medications as prescribed and inhaler as needed.     Expected Outcomes Short: Attend [facility] regularly to improve shortness of breath with ADL's. Long: maintain independence with ADL's Short: Continue to watch weight and continue working towards weight loss, notify  doctor of any changes Long: Continue to maintain lifestyle risk factors Short: Continue to watch blood sugars, notify MD of any major changes Long: Continue to monitor lifestyle risk factors              Core Components/Risk Factors/Patient Goals at Discharge (Final Review):   Goals and Risk Factor Review - 08/14/22 1006       Core Components/Risk Factors/Patient Goals Review   Personal Goals Review Improve shortness of breath with ADL's;Tobacco Cessation;Weight Management/Obesity;Diabetes    Review Kiriana states she has not really lost weight much weight but admits she has not exercise as much because she has been sick. She recently had a MRI completed and her MD concluded that her SOB is mostly from lung damage, and her heart workup was good. She wants to ease back into exercise again slowly and thinks that will help with her breathing. Continues to not smoke. She checks her blood sugar every morning and still ranges 130-140, though she thinks its side effect from meidcations. She has dicussed this with her doctor. She also recently finished being on prednisone which could've sky rocketed it as well, She is taking all of her medications as prescribed and inhaler as needed.    Expected Outcomes Short: Continue to watch blood sugars, notify MD of any major changes Long: Continue to monitor lifestyle risk factors             ITP Comments:  ITP Comments     Row Name 05/15/22 1050 05/23/22 1413 05/24/22 1009 05/31/22 0800 06/05/22 1410   ITP Comments Virtual Visit completed. Patient informed on EP and RD appointment and 6 Minute walk test. Patient also informed of patient health questionnaires on My Chart. Patient Verbalizes understanding. Visit diagnosis can be found in [facility] 03/13/22. Completed 6MWT and gym orientation. Initial ITP created and sent for review to Dr. Ottie Glazier, Medical Director. First full day of exercise!  Patient was oriented to gym and equipment including functions,  settings, policies, and procedures.  Patient's individual exercise prescription and treatment plan were reviewed.  All starting workloads were established based on the results of the 6 minute walk test done at initial orientation visit.  The plan for exercise progression was also introduced and progression will be customized based on patient's performance and goals. 30 Day review completed. Medical Director ITP review done, changes made as directed, and signed approval by Medical Director.    New to program Completed Initial RD Consulation    Row Name 06/28/22 1028 07/25/22 1032 07/26/22 0947 08/23/22 1119     ITP Comments 30 Day review completed. Medical Director ITP review done, changes made as directed, and signed approval by Medical Director. Completed nutrition follow-up 30 Day review completed. Medical Director ITP review done, changes made as directed, and signed approval by Medical Director. 30 Day review completed. Medical  Director ITP review done, changes made as directed, and signed approval by Market researcher.             Comments:

## 2022-08-23 NOTE — Progress Notes (Signed)
Daily Session Note  Patient Details  Name: Amber Holder MRN: [id number] Date of Birth: 10-12-1945 Referring Provider:   Flowsheet Row Pulmonary Rehab from 05/23/2022 in [facility] Cardiac and Pulmonary Rehab  Referring Provider Clayborn Bigness MD       Encounter Date: 08/23/2022  Check In:  Session Check In - 08/23/22 0958       Check-In   Supervising physician immediately available to respond to emergencies See telemetry face sheet for immediately available ER MD    Location [facility]-Cardiac & Pulmonary Rehab    Staff Present Darlyne Russian, RN, ADN;Jessica Luan Pulling, MA, RCEP, CCRP, CCET;Noah Tickle, BS, Exercise Physiologist    Virtual Visit No    Medication changes reported     No    Fall or balance concerns reported    No    Tobacco Cessation No Change    Warm-up and Cool-down Performed on first and last piece of equipment    Resistance Training Performed Yes    VAD Patient? No    PAD/SET Patient? No      Pain Assessment   Currently in Pain? No/denies                Social History   Tobacco Use  Smoking Status Former   Packs/day: 1.00   Years: 50.00   Total pack years: 50.00   Types: Cigarettes   Quit date: 12/28/2021   Years since quitting: 0.6   Passive exposure: Never  Smokeless Tobacco Never  Tobacco Comments   1 pack daily//quit 12/28/2021    Goals Met:  Independence with exercise equipment Exercise tolerated well No report of concerns or symptoms today Strength training completed today  Goals Unmet:  Not Applicable  Comments: Pt able to follow exercise prescription today without complaint.  Will continue to monitor for progression.    Dr. Emily Filbert is Medical Director for [facility].  Dr. Ottie Glazier is Medical Director for [facility] Pulmonary Rehabilitation.

## 2022-08-25 ENCOUNTER — Ambulatory Visit: Payer: PRIVATE HEALTH INSURANCE | Admitting: Cardiology

## 2022-08-25 ENCOUNTER — Encounter: Payer: Medicare Other | Admitting: *Deleted

## 2022-08-25 DIAGNOSIS — J449 Chronic obstructive pulmonary disease, unspecified: Secondary | ICD-10-CM | POA: Diagnosis not present

## 2022-08-25 NOTE — Progress Notes (Signed)
Daily Session Note  Patient Details  Name: Amber Holder MRN: [id number] Date of Birth: [birth year] Referring Provider:   Flowsheet Row Pulmonary Rehab from 05/23/2022 in [facility] Cardiac and Pulmonary Rehab  Referring Provider Clayborn Bigness MD       Encounter Date: 08/25/2022  Check In:  Session Check In - 08/25/22 1028       Check-In   Supervising physician immediately available to respond to emergencies See telemetry face sheet for immediately available ER MD    Location [facility]-Cardiac & Pulmonary Rehab    Staff Present Heath Lark, RN, BSN, CCRP;Jessica [place], MA, RCEP, CCRP, CCET;Joseph [place], [place]    Virtual Visit No    Medication changes reported     No    Fall or balance concerns reported    No    Warm-up and Cool-down Performed on first and last piece of equipment    Resistance Training Performed Yes    VAD Patient? No    PAD/SET Patient? No      Pain Assessment   Currently in Pain? No/denies                Social History   Tobacco Use  Smoking Status Former   Packs/day: 1.00   Years: 50.00   Total pack years: 50.00   Types: Cigarettes   Quit date: 12/28/2021   Years since quitting: 0.6   Passive exposure: Never  Smokeless Tobacco Never  Tobacco Comments   1 pack daily//quit 12/28/2021    Goals Met:  Proper associated with RPD/PD & O2 Sat Independence with exercise equipment Exercise tolerated well No report of concerns or symptoms today  Goals Unmet:  Not Applicable  Comments: Pt able to follow exercise prescription today without complaint.  Will continue to monitor for progression.    Dr. Emily Filbert is Medical Director for [place].  Dr. Ottie Glazier is Medical Director for [facility] Pulmonary Rehabilitation.

## 2022-08-30 ENCOUNTER — Encounter: Payer: Medicare Other | Attending: Internal Medicine | Admitting: *Deleted

## 2022-08-30 DIAGNOSIS — J449 Chronic obstructive pulmonary disease, unspecified: Secondary | ICD-10-CM | POA: Insufficient documentation

## 2022-08-30 NOTE — Progress Notes (Signed)
Daily Session Note  Patient Details  Name: Amber Holder MRN: [id number] Date of Birth: [birth year] Referring Provider:   Flowsheet Row Pulmonary Rehab from 05/23/2022 in [facility] Cardiac and Pulmonary Rehab  Referring Provider Clayborn Bigness MD       Encounter Date: 08/30/2022  Check In:  Session Check In - 08/30/22 1007       Check-In   Supervising physician immediately available to respond to emergencies See telemetry face sheet for immediately available ER MD    Location [facility]-Cardiac & Pulmonary Rehab    Staff Present Darlyne Russian, RN, ADN;Meredith Sherryll Burger, RN BSN;Noah Tickle, BS, Exercise Physiologist    Virtual Visit No    Medication changes reported     No    Fall or balance concerns reported    No    Tobacco Cessation No Change    Warm-up and Cool-down Performed on first and last piece of equipment    Resistance Training Performed Yes    VAD Patient? No    PAD/SET Patient? No      Pain Assessment   Currently in Pain? No/denies                Social History   Tobacco Use  Smoking Status Former   Packs/day: 1.00   Years: 50.00   Total pack years: 50.00   Types: Cigarettes   Quit date: 12/28/2021   Years since quitting: 0.6   Passive exposure: Never  Smokeless Tobacco Never  Tobacco Comments   1 pack daily//quit 12/28/2021    Goals Met:  Independence with exercise equipment Exercise tolerated well No report of concerns or symptoms today Strength training completed today  Goals Unmet:  Not Applicable  Comments: Pt able to follow exercise prescription today without complaint.  Will continue to monitor for progression.    Dr. Emily Filbert is Medical Director for [facility].  Dr. Ottie Glazier is Medical Director for [facility] Pulmonary Rehabilitation.

## 2022-09-01 ENCOUNTER — Telehealth: Payer: Self-pay | Admitting: Internal Medicine

## 2022-09-01 NOTE — Telephone Encounter (Signed)
Lvm to move 09/07/22 appointment-Amber Holder

## 2022-09-04 ENCOUNTER — Encounter: Payer: Medicare Other | Admitting: *Deleted

## 2022-09-04 ENCOUNTER — Other Ambulatory Visit
Admission: RE | Admit: 2022-09-04 | Discharge: 2022-09-04 | Disposition: A | Discharge status: Home / Self Care | Payer: Medicare Other | Source: Ambulatory Visit | Attending: Internal Medicine | Admitting: Internal Medicine

## 2022-09-04 DIAGNOSIS — F411 Generalized anxiety disorder: Secondary | ICD-10-CM | POA: Insufficient documentation

## 2022-09-04 DIAGNOSIS — I5032 Chronic diastolic (congestive) heart failure: Secondary | ICD-10-CM | POA: Diagnosis not present

## 2022-09-04 DIAGNOSIS — E1165 Type 2 diabetes mellitus with hyperglycemia: Secondary | ICD-10-CM | POA: Diagnosis not present

## 2022-09-04 DIAGNOSIS — J449 Chronic obstructive pulmonary disease, unspecified: Secondary | ICD-10-CM

## 2022-09-04 DIAGNOSIS — J9611 Chronic respiratory failure with hypoxia: Secondary | ICD-10-CM | POA: Diagnosis not present

## 2022-09-04 LAB — BASIC METABOLIC PANEL
Anion gap: 7 (ref 5–15)
BUN: 14 mg/dL (ref 8–23)
CO2: 24 mmol/L (ref 22–32)
Calcium: 9 mg/dL (ref 8.9–10.3)
Chloride: 106 mmol/L (ref 98–111)
Creatinine, Ser: 0.71 mg/dL (ref 0.44–1.00)
GFR, Estimated: >60 mL/min (ref >60)
Glucose, Bld: 117 mg/dL — ABNORMAL HIGH (ref 70–99)
Potassium: 4.6 mmol/L (ref 3.5–5.1)
Sodium: 137 mmol/L (ref 135–145)

## 2022-09-04 NOTE — Progress Notes (Signed)
Daily Session Note  Patient Details  Name: Amber Holder MRN: [id number] Date of Birth: 09/04/1945 Referring Provider:   Flowsheet Row Pulmonary Rehab from 05/23/2022 in [facility] Cardiac and Pulmonary Rehab  Referring Provider Clayborn Bigness MD       Encounter Date: 09/04/2022  Check In:  Session Check In - 09/04/22 1012       Check-In   Supervising physician immediately available to respond to emergencies See telemetry face sheet for immediately available ER MD    Location [facility]-Cardiac & Pulmonary Rehab    Staff Present Darlyne Russian, RN, Doyce Para, BS, ACSM CEP, Exercise Physiologist;Noah Tickle, BS, Exercise Physiologist    Virtual Visit No    Medication changes reported     No    Fall or balance concerns reported    No    Tobacco Cessation No Change    Warm-up and Cool-down Performed on first and last piece of equipment    Resistance Training Performed Yes    VAD Patient? No    PAD/SET Patient? No      Pain Assessment   Currently in Pain? No/denies                Social History   Tobacco Use  Smoking Status Former   Packs/day: 1.00   Years: 50.00   Total pack years: 50.00   Types: Cigarettes   Quit date: 12/28/2021   Years since quitting: 0.6   Passive exposure: Never  Smokeless Tobacco Never  Tobacco Comments   1 pack daily//quit 12/28/2021    Goals Met:  Independence with exercise equipment Exercise tolerated well No report of concerns or symptoms today Strength training completed today  Goals Unmet:  Not Applicable  Comments: Pt able to follow exercise prescription today without complaint.  Will continue to monitor for progression.    Dr. Emily Filbert is Medical Director for [place].  Dr. Ottie Glazier is Medical Director for [facility] Pulmonary Rehabilitation.

## 2022-09-05 ENCOUNTER — Ambulatory Visit (INDEPENDENT_AMBULATORY_CARE_PROVIDER_SITE_OTHER): Payer: Medicare Other | Admitting: Internal Medicine

## 2022-09-05 ENCOUNTER — Encounter: Payer: Self-pay | Admitting: Internal Medicine

## 2022-09-05 VITALS — BP 118/78 | HR 68 | Temp 98.3°F | Resp 16 | Ht 66.0 in | Wt 194.2 lb

## 2022-09-05 DIAGNOSIS — J9611 Chronic respiratory failure with hypoxia: Secondary | ICD-10-CM | POA: Diagnosis not present

## 2022-09-05 DIAGNOSIS — Z9981 Dependence on supplemental oxygen: Secondary | ICD-10-CM | POA: Diagnosis not present

## 2022-09-05 DIAGNOSIS — I5032 Chronic diastolic (congestive) heart failure: Secondary | ICD-10-CM | POA: Diagnosis not present

## 2022-09-05 DIAGNOSIS — E119 Type 2 diabetes mellitus without complications: Secondary | ICD-10-CM | POA: Diagnosis not present

## 2022-09-05 LAB — POCT GLYCOSYLATED HEMOGLOBIN (HGB A1C): Hemoglobin A1C: 6.3 % — AB (ref 4.0–5.6)

## 2022-09-05 MED ORDER — METFORMIN HCL ER (OSM) 500 MG PO TB24: ORAL_TABLET | ORAL | 3 refills | Status: DC

## 2022-09-05 NOTE — Progress Notes (Signed)
[facility] [place], [place] [address]  Internal MEDICINE  Office Visit Note  Patient Name: Amber Holder  [id number]  [id number]  Date of Service: 09/21/2022  Chief Complaint  Patient presents with   Follow-up   Diabetes   Gastroesophageal Reflux   Quality Metric Gaps    TDAP    HPI Pt is here for routine follow up Denies any new complaints but she feels her improvement is slow  She is trying to go to pulmonary rehab however diet is not being followed properly  Diabetic control is not improving either  Previously pt was on Wellbutrin but stopped when she had stopped smoking  Still being followed by cardiology, diuretics were adjusted due to ongoing hypokalemia     Current Medication: Outpatient Encounter Medications as of 09/05/2022  Medication Sig   acetaminophen (TYLENOL) 500 MG chewable tablet Chew 1,000 mg by mouth every 8 (eight) hours as needed for pain.   ALPRAZolam (XANAX) 0.25 MG tablet Take one tab po bid for anxiety prn   amoxicillin-clavulanate (AUGMENTIN) 875-125 MG tablet Take 1 tablet by mouth 2 (two) times daily.   aspirin EC 81 MG tablet Take 1 tablet (81 mg total) by mouth daily. Swallow whole.   atorvastatin (LIPITOR) 20 MG tablet Take 1 tablet (20 mg total) by mouth daily.   cholecalciferol (VITAMIN D) 400 UNITS TABS tablet Take 2,000 Units by mouth daily.   CRANBERRY PO Take 25,000 mg by mouth daily.   dapagliflozin propanediol (FARXIGA) 10 MG TABS tablet Take 1 tablet (10 mg total) by mouth daily before breakfast.   diphenhydrAMINE (BENADRYL) 50 MG tablet Take one tab 13 hrs, 7 hrs and 1 hr before procedure along with prednisone, may take one extra if experienced itching or hives   ergocalciferol (DRISDOL) 1.25 MG (50000 UT) capsule Take one cap q week   fluconazole (DIFLUCAN) 150 MG tablet Take one tab po q week for fungal infection   glucose blood (ONETOUCH VERIO) test strip BLOOD SUGAR TESTING THREE TIMES DAILY . DX E11.65    hydrocortisone cream 0.5 % Apply topically as needed.    hydrocortisone cream 0.5 % Apply topically.   ibandronate (BONIVA) 150 MG tablet TAKE 1 TABLET (150 MG TOTAL) BY MOUTH EVERY 30 (THIRTY) DAYS.   Lancets (ONETOUCH DELICA PLUS DVVOHY07P) MISC Use  as directed twice a daily DX E11.65   levofloxacin (LEVAQUIN) 500 MG tablet Take 1 tablet (500 mg total) by mouth daily.   Magnesium 250 MG TABS Take by mouth. Takes 1 tablet 2-3 times per week   montelukast (SINGULAIR) 10 MG tablet Take 10 mg by mouth daily as needed.   Multiple Vitamins-Minerals (MULTIVITAMIN ADULTS PO) Take 1 tablet by mouth daily.   omeprazole (PRILOSEC) 40 MG capsule Take 1 capsule (40 mg total) by mouth daily.   OXYGEN Inhale 3 L into the lungs. Pt uses [facility] for Oxygen   potassium chloride SA (KLOR-CON M) 20 MEQ tablet TAKE 3 TABS (60 MEQ) IN AM AND 2 TABS (40 MEQ) IN THE PM. (Patient taking differently: 40 mEq 2 (two) times daily. TAKE 3 TABS (60 MEQ) IN AM AND 2 TABS (40 MEQ) IN THE PM.)   predniSONE (DELTASONE) 10 MG tablet Take 1 tab po 3 x day for 3 days then take 1 tab po 2 x a day for 3 days and then take 1 tab po daily for 3 days   Simethicone 80 MG TABS Take 1 tablet (80 mg total)  by mouth as needed (burping). As directed.   Tiotropium Bromide-Olodaterol 2.5-2.5 MCG/ACT AERS Inhale 2 puffs into the lungs daily.   torsemide (DEMADEX) 20 MG tablet Take 1 tablet (20 mg total) by mouth daily. May take an extra 20 mg as needed for swelling   [DISCONTINUED] albuterol (VENTOLIN HFA) 108 (90 Base) MCG/ACT inhaler Inhale 2 puffs into the lungs every 6 (six) hours as needed for wheezing or shortness of breath.   [DISCONTINUED] fluticasone (FLOVENT HFA) 110 MCG/ACT inhaler Inhale 2 puffs into the lungs 2 (two) times daily as needed.   [DISCONTINUED] fluticasone (FLOVENT HFA) 110 MCG/ACT inhaler Inhale 2 puffs into the lungs 2 (two) times daily as needed.   [DISCONTINUED] metformin (FORTAMET) 500 MG (OSM) 24 hr tablet  Take one tab po qd with supper for dm   No facility-administered encounter medications on file as of 09/05/2022.    Surgical History: Past Surgical History:  Procedure Laterality Date   APPENDECTOMY     cataract surgery Bilateral    CHOLECYSTECTOMY     COLONOSCOPY     COLONOSCOPY WITH PROPOFOL N/A 05/28/2015   Procedure: COLONOSCOPY WITH PROPOFOL;  Surgeon: Lollie Sails, MD;  Location: [facility] ENDOSCOPY;  Service: Endoscopy;  Laterality: N/A;   COLONOSCOPY WITH PROPOFOL N/A 10/28/2018   Procedure: COLONOSCOPY WITH PROPOFOL;  Surgeon: Lollie Sails, MD;  Location: [facility] ENDOSCOPY;  Service: Endoscopy;  Laterality: N/A;   EYE SURGERY Bilateral 2015   Cataract Extraction with IOL   LITHOTRIPSY  2015   has had many stones   Mastoidotomy  1970   ROTATOR CUFF REPAIR Right 2011   TOTAL HIP ARTHROPLASTY Right 06/14/2016   Procedure: TOTAL HIP ARTHROPLASTY;  Surgeon: Dereck Leep, MD;  Location: [facility] ORS;  Service: Orthopedics;  Laterality: Right;    Medical History: Past Medical History:  Diagnosis Date   Anxiety 01/02/2012   Arthritis    Asthma    Blockage of coronary artery of heart (HCC)    Chronic kidney disease    Kidney stones   Colon polyps    COPD (chronic obstructive pulmonary disease) (HCC)    Diabetes mellitus without complication (HCC)    GERD (gastroesophageal reflux disease)    HOH (hard of hearing)    Bilateral hearing aids   Kidney stone    Osteoporosis    Sleep apnea    Does not use C-PAP on a regular basis   Venous stasis     Family History: Family History  Problem Relation Age of Onset   Breast cancer Maternal Aunt 41   Bladder Cancer Neg Hx    Kidney cancer Neg Hx     Social History   Socioeconomic History   Marital status: Divorced    Spouse name: Not on file   Number of children: Not on file   Years of education: Not on file   Highest education level: Not on file  Occupational History   Not on file  Tobacco Use   Smoking status:  Former    Packs/day: 1.00    Years: 50.00    Total pack years: 50.00    Types: Cigarettes    Quit date: 12/28/2021    Years since quitting: 0.7    Passive exposure: Never   Smokeless tobacco: Never   Tobacco comments:    1 pack daily//quit 12/28/2021  Vaping Use   Vaping Use: Never used  Substance and Sexual Activity   Alcohol use: Not Currently    Comment: very rarely  Drug use: No   Sexual activity: Not Currently  Other Topics Concern   Not on file  Social History Narrative   Not on file   Social Determinants of Health   Financial Resource Strain: Not on file  Food Insecurity: Not on file  Transportation Needs: Not on file  Physical Activity: Insufficiently Active (09/21/2022)   Exercise Vital Sign    Days of Exercise per Week: 3 days    Minutes of Exercise per Session: 30 min  Stress: Not on file  Social Connections: Not on file  Intimate Partner Violence: Not on file      Review of Systems  Constitutional:  Negative for fatigue and fever.  HENT:  Negative for congestion, mouth sores and postnasal drip.   Respiratory:  Negative for cough.   Cardiovascular:  Negative for chest pain.  Genitourinary:  Negative for flank pain.  Psychiatric/Behavioral: Negative.      Vital Signs: BP 118/78 Comment: 118/90  Pulse 68   Temp 98.3 F (36.8 C)   Resp 16   Ht '5\' 6"'$  (1.676 m)   Wt 194 lb 3.2 oz (88.1 kg)   SpO2 94%   BMI 31.34 kg/m    Physical Exam Constitutional:      Appearance: Normal appearance.  HENT:     Head: Normocephalic and atraumatic.     Nose: Nose normal.     Mouth/Throat:     Mouth: Mucous membranes are moist.     Pharynx: No posterior oropharyngeal erythema.  Eyes:     Extraocular Movements: Extraocular movements intact.     Pupils: Pupils are equal, round, and reactive to light.  Cardiovascular:     Pulses: Normal pulses.     Heart sounds: Normal heart sounds.  Pulmonary:     Effort: Pulmonary effort is normal.     Breath sounds:  Normal breath sounds.  Neurological:     General: No focal deficit present.     Mental Status: She is alert.  Psychiatric:        Mood and Affect: Mood normal.        Behavior: Behavior normal.        Assessment/Plan: 1. Type 2 diabetes mellitus without complication, without long-term current use of insulin (HCC) Pt is to continue [place], add metformin ( sustained release to avoid GI intolerance  - POCT HgB A1C ( 6.3)   2. Chronic respiratory failure with hypoxia (HCC) Continue on all MDI as before, continue cardiopulmonary rehab   3. Chronic diastolic congestive heart failure (HCC) Modified diuretics due to hypokalemia, will need updated labs    4. Oxygen dependent Continue O2    General Counseling: Zariah verbalizes understanding of the findings of todays visit and agrees with plan of treatment. I have discussed any further diagnostic evaluation that may be needed or ordered today. We also reviewed her medications today. she has been encouraged to call the office with any questions or concerns that should arise related to todays visit.    Orders Placed This Encounter  Procedures   POCT HgB A1C    Meds ordered this encounter  Medications   DISCONTD: metformin (FORTAMET) 500 MG (OSM) 24 hr tablet    Sig: Take one tab po qd with supper for dm    Dispense:  90 tablet    Refill:  3    Total time spent:35 Minutes Time spent includes review of chart, medications, test results, and follow up plan with the patient.   [place] Controlled Substance  Database was reviewed by me.   Dr Lavera Guise Internal medicine

## 2022-09-06 ENCOUNTER — Encounter: Payer: Medicare Other | Admitting: *Deleted

## 2022-09-06 ENCOUNTER — Other Ambulatory Visit: Payer: Self-pay

## 2022-09-06 DIAGNOSIS — J449 Chronic obstructive pulmonary disease, unspecified: Secondary | ICD-10-CM

## 2022-09-06 MED ORDER — METFORMIN HCL ER 500 MG PO TB24: 500.0000 mg | ORAL_TABLET | Freq: Every day | ORAL | 1 refills | Status: DC

## 2022-09-06 NOTE — Progress Notes (Signed)
Discharge Summary: Amber Holder (DOB: 1945-09-30)  Kadesia graduated today from  rehab with 36 sessions completed.  Details of the patient's exercise prescription and what She needs to do in order to continue the prescription and progress were discussed with patient.  Patient was given a copy of prescription and goals.  Patient verbalized understanding.  [name] plans to continue to exercise by going to [employer].   Arthur Name 05/23/22 1450 08/23/22 1005       6 Minute Walk   Phase Initial Discharge    Distance 990 feet 1225 feet    Distance % Change -- 23.7 %    Distance Feet Change -- 235 ft    Walk Time 6 minutes 6 minutes    # of Rest Breaks 0 0    MPH 1.87 2.32    METS 1.62 1.96    RPE 15 16    Perceived Dyspnea  3 3    VO2 Peak 5.67 6.87    Symptoms Yes (comment) Yes (comment)    Comments Right hip tightness, SOB SOB    Resting HR 72 bpm 73 bpm    Resting BP 106/66 146/74    Resting Oxygen Saturation  93 % 90 %    Exercise Oxygen Saturation  during 6 min walk 87 % 86 %    Max Ex. HR 90 bpm 94 bpm    Max Ex. BP 124/62 122/64    2 Minute Post BP 104/66 --      Interval HR   1 Minute HR 83 73    2 Minute HR 85 82    3 Minute HR 87 91    4 Minute HR 88 94    5 Minute HR 89 94    6 Minute HR 90 94    2 Minute Post HR 77 --    Interval Heart Rate? Yes Yes      Interval Oxygen   Interval Oxygen? Yes Yes    Baseline Oxygen Saturation % 93 % 90 %    1 Minute Oxygen Saturation % 92 % 90 %    1 Minute Liters of Oxygen 3 L 2 L  Pulsed    2 Minute Oxygen Saturation % 88 % 86 %    2 Minute Liters of Oxygen 3 L 2 L  Pulsed    3 Minute Oxygen Saturation % 87 % 86 %    3 Minute Liters of Oxygen 3 L 2 L  Pulsed    4 Minute Oxygen Saturation % 88 % 86 %    4 Minute Liters of Oxygen 3 L 2 L  Pulsed    5 Minute Oxygen Saturation % 87 % 86 %    5 Minute Liters of Oxygen 3 L 2 L  Pulsed    6 Minute Oxygen Saturation % 87 % 86 %    6 Minute Liters of Oxygen 3 L  2 L  Pulsed    2 Minute Post Oxygen Saturation % 94 % --    2 Minute Post Liters of Oxygen 3 L --

## 2022-09-06 NOTE — Progress Notes (Signed)
Daily Session Note  Patient Details  Name: Amber Holder MRN: [id number] Date of Birth: 1946-04-29 Referring Provider:   Flowsheet Row Pulmonary Rehab from 05/23/2022 in [facility] Cardiac and Pulmonary Rehab  Referring Provider Clayborn Bigness MD       Encounter Date: 09/06/2022  Check In:  Session Check In - 09/06/22 1027       Check-In   Supervising physician immediately available to respond to emergencies See telemetry face sheet for immediately available ER MD    Location [facility]-Cardiac & Pulmonary Rehab    Staff Present Darlyne Russian, RN, ADN;Joseph Hood, RCP,RRT,BSRT;Noah Tickle, BS, Exercise Physiologist    Virtual Visit No    Medication changes reported     No    Fall or balance concerns reported    No    Tobacco Cessation No Change    Warm-up and Cool-down Performed on first and last piece of equipment    Resistance Training Performed Yes    VAD Patient? No    PAD/SET Patient? No      Pain Assessment   Currently in Pain? No/denies                Social History   Tobacco Use  Smoking Status Former   Packs/day: 1.00   Years: 50.00   Total pack years: 50.00   Types: Cigarettes   Quit date: 12/28/2021   Years since quitting: 0.6   Passive exposure: Never  Smokeless Tobacco Never  Tobacco Comments   1 pack daily//quit 12/28/2021    Goals Met:  Independence with exercise equipment Exercise tolerated well No report of concerns or symptoms today Strength training completed today  Goals Unmet:  Not Applicable  Comments:  Amber Holder graduated today from  rehab with 36 sessions completed.  Details of the patient's exercise prescription and what She needs to do in order to continue the prescription and progress were discussed with patient.  Patient was given a copy of prescription and goals.  Patient verbalized understanding.  Amber Holder plans to continue to exercise by going to [employer].    Dr. Emily Filbert is Medical Director for [place].   Dr. Ottie Glazier is Medical Director for [facility] Pulmonary Rehabilitation.

## 2022-09-06 NOTE — Progress Notes (Signed)
Pulmonary Individual Treatment Plan  Patient Details  Name: Raelyn Racette MRN: [id number] Date of Birth: 1946-02-13 Referring Provider:   Flowsheet Row Pulmonary Rehab from 05/23/2022 in [facility] Cardiac and Pulmonary Rehab  Referring Provider Clayborn Bigness MD       Initial Encounter Date:  Flowsheet Row Pulmonary Rehab from 05/23/2022 in [facility] Cardiac and Pulmonary Rehab  Date 05/23/22       Visit Diagnosis: Chronic obstructive pulmonary disease, unspecified COPD type ([place])  Patient's Home Medications on Admission:  Current Outpatient Medications:    acetaminophen (TYLENOL) 500 MG chewable tablet, Chew 1,000 mg by mouth every 8 (eight) hours as needed for pain., Disp: , Rfl:    albuterol (VENTOLIN HFA) 108 (90 Base) MCG/ACT inhaler, Inhale 2 puffs into the lungs every 6 (six) hours as needed for wheezing or shortness of breath., Disp: 18 g, Rfl: 2   ALPRAZolam (XANAX) 0.25 MG tablet, Take one tab po bid for anxiety prn, Disp: 30 tablet, Rfl: 1   amoxicillin-clavulanate (AUGMENTIN) 875-125 MG tablet, Take 1 tablet by mouth 2 (two) times daily., Disp: 20 tablet, Rfl: 0   aspirin EC 81 MG tablet, Take 1 tablet (81 mg total) by mouth daily. Swallow whole., Disp: 90 tablet, Rfl: 3   atorvastatin (LIPITOR) 20 MG tablet, Take 1 tablet (20 mg total) by mouth daily., Disp: 90 tablet, Rfl: 3   cholecalciferol (VITAMIN D) 400 UNITS TABS tablet, Take 2,000 Units by mouth daily., Disp: , Rfl:    CRANBERRY PO, Take 25,000 mg by mouth daily., Disp: , Rfl:    dapagliflozin propanediol (FARXIGA) 10 MG TABS tablet, Take 1 tablet (10 mg total) by mouth daily before breakfast., Disp: 90 tablet, Rfl: 3   diphenhydrAMINE (BENADRYL) 50 MG tablet, Take one tab 13 hrs, 7 hrs and 1 hr before procedure along with prednisone, may take one extra if experienced itching or hives, Disp: 4 tablet, Rfl: 0   ergocalciferol (DRISDOL) 1.25 MG (50000 UT) capsule, Take one cap q week, Disp: 12 capsule, Rfl: 3   fluconazole  (DIFLUCAN) 150 MG tablet, Take one tab po q week for fungal infection, Disp: 4 tablet, Rfl: 0   fluticasone (FLOVENT HFA) 110 MCG/ACT inhaler, Inhale 2 puffs into the lungs 2 (two) times daily as needed., Disp: , Rfl:    glucose blood (ONETOUCH VERIO) test strip, BLOOD SUGAR TESTING THREE TIMES DAILY . DX E11.65, Disp: 100 strip, Rfl: 3   hydrocortisone cream 0.5 %, Apply topically as needed. , Disp: , Rfl:    hydrocortisone cream 0.5 %, Apply topically., Disp: , Rfl:    ibandronate (BONIVA) 150 MG tablet, TAKE 1 TABLET (150 MG TOTAL) BY MOUTH EVERY 30 (THIRTY) DAYS., Disp: 3 tablet, Rfl: 3   Lancets (ONETOUCH DELICA PLUS PJSRPR94V) MISC, Use  as directed twice a daily DX E11.65, Disp: 100 each, Rfl: 3   levofloxacin (LEVAQUIN) 500 MG tablet, Take 1 tablet (500 mg total) by mouth daily., Disp: 10 tablet, Rfl: 0   Magnesium 250 MG TABS, Take by mouth. Takes 1 tablet 2-3 times per week, Disp: , Rfl:    metformin (FORTAMET) 500 MG (OSM) 24 hr tablet, Take one tab po qd with supper for dm, Disp: 90 tablet, Rfl: 3   montelukast (SINGULAIR) 10 MG tablet, Take 10 mg by mouth daily as needed., Disp: , Rfl:    Multiple Vitamins-Minerals (MULTIVITAMIN ADULTS PO), Take 1 tablet by mouth daily., Disp: , Rfl:    omeprazole (PRILOSEC) 40 MG capsule, Take 1 capsule (40  mg total) by mouth daily., Disp: , Rfl:    OXYGEN, Inhale 3 L into the lungs. Pt uses [facility] for Oxygen, Disp: , Rfl:    potassium chloride SA (KLOR-CON M) 20 MEQ tablet, TAKE 3 TABS (60 MEQ) IN AM AND 2 TABS (40 MEQ) IN THE PM. (Patient taking differently: 40 mEq 2 (two) times daily. TAKE 3 TABS (60 MEQ) IN AM AND 2 TABS (40 MEQ) IN THE PM.), Disp: 300 tablet, Rfl: 2   predniSONE (DELTASONE) 10 MG tablet, Take 1 tab po 3 x day for 3 days then take 1 tab po 2 x a day for 3 days and then take 1 tab po daily for 3 days, Disp: 18 tablet, Rfl: 0   Simethicone 80 MG TABS, Take 1 tablet (80 mg total) by mouth as needed (burping). As directed.,  Disp: , Rfl: 0   Tiotropium Bromide-Olodaterol 2.5-2.5 MCG/ACT AERS, Inhale 2 puffs into the lungs daily., Disp: 1 each, Rfl: 4   torsemide (DEMADEX) 20 MG tablet, Take 1 tablet (20 mg total) by mouth daily. May take an extra 20 mg as needed for swelling, Disp: 30 tablet, Rfl: 1  Past Medical History: Past Medical History:  Diagnosis Date   Anxiety 01/02/2012   Arthritis    Asthma    Blockage of coronary artery of heart (HCC)    Chronic kidney disease    Kidney stones   Colon polyps    COPD (chronic obstructive pulmonary disease) (HCC)    Diabetes mellitus without complication (HCC)    GERD (gastroesophageal reflux disease)    HOH (hard of hearing)    Bilateral hearing aids   Kidney stone    Osteoporosis    Sleep apnea    Does not use C-PAP on a regular basis   Venous stasis     Tobacco Use: Social History   Tobacco Use  Smoking Status Former   Packs/day: 1.00   Years: 50.00   Total pack years: 50.00   Types: Cigarettes   Quit date: 12/28/2021   Years since quitting: 0.6   Passive exposure: Never  Smokeless Tobacco Never  Tobacco Comments   1 pack daily//quit 12/28/2021    Labs: Review Flowsheet  More data exists      Latest Ref Rng & Units 01/16/2022 04/24/2022 05/19/2022 07/07/2022 09/05/2022  Labs for ITP Cardiac and Pulmonary Rehab  Cholestrol 0 - 200 mg/dL 261  - 162  172  -  LDL (calc) 0 - 99 mg/dL 182  - 92  106  -  HDL-C >40 mg/dL 52  - 48  45  -  Trlycerides <150 mg/dL 134  - 111  104  -  Hemoglobin A1c 4.0 - 5.6 % - 6.2  - - 6.3      Pulmonary Assessment Scores:  Pulmonary Assessment Scores     Row Name 05/23/22 1415 09/04/22 0939       ADL UCSD   ADL Phase Entry Exit    SOB Score total 62 48    Rest 2 1    Walk 3 2    Stairs 5 5    Bath 2 2    Dress 2 1    Shop 3 2      CAT Score   CAT Score 17 16      mMRC Score   mMRC Score 2 --             UCSD: Self-administered rating of dyspnea associated with activities  of daily living  (ADLs) 6-point scale (0 = "not at all" to 5 = "maximal or unable to do because of breathlessness")  Scoring Scores range from 0 to 120.  Minimally important difference is 5 units  CAT: CAT can identify the health impairment of COPD patients and is better correlated with disease progression.  CAT has a scoring range of zero to 40. The CAT score is classified into four groups of low (less than 10), medium (10 - 20), high (21-30) and very high (31-40) based on the impact level of disease on health status. A CAT score over 10 suggests significant symptoms.  A worsening CAT score could be explained by an exacerbation, poor medication adherence, poor inhaler technique, or progression of COPD or comorbid conditions.  CAT MCID is 2 points  mMRC: mMRC (Modified Medical Research Council) Dyspnea Scale is used to assess the degree of baseline functional disability in patients of respiratory disease due to dyspnea. No minimal important difference is established. A decrease in score of 1 point or greater is considered a positive change.   Pulmonary Function Assessment:  Pulmonary Function Assessment - 05/15/22 1052       Breath   Shortness of Breath Yes;Limiting activity             Exercise Target Goals: Exercise Program Goal: Individual exercise prescription set using results from initial 6 min walk test and THRR while considering  patient's activity barriers and safety.   Exercise Prescription Goal: Initial exercise prescription builds to 30-45 minutes a day of aerobic activity, 2-3 days per week.  Home exercise guidelines will be given to patient during program as part of exercise prescription that the participant will acknowledge.  Education: Aerobic Exercise: - Group verbal and visual presentation on the components of exercise prescription. Introduces F.I.T.T principle from ACSM for exercise prescriptions.  Reviews F.I.T.T. principles of aerobic exercise including progression. Written  material given at graduation. Flowsheet Row Pulmonary Rehab from 08/23/2022 in [facility] Cardiac and Pulmonary Rehab  Date 07/05/22  Educator [facility]  Instruction Review Code 1- Verbalizes Understanding       Education: Resistance Exercise: - Group verbal and visual presentation on the components of exercise prescription. Introduces F.I.T.T principle from ACSM for exercise prescriptions  Reviews F.I.T.T. principles of resistance exercise including progression. Written material given at graduation.    Education: Exercise & Equipment Safety: - Individual verbal instruction and demonstration of equipment use and safety with use of the equipment. Flowsheet Row Pulmonary Rehab from 08/23/2022 in [facility] Cardiac and Pulmonary Rehab  Date 05/15/22  Educator [facility]  Instruction Review Code 1- Verbalizes Understanding       Education: Exercise Physiology & General Exercise Guidelines: - Group verbal and written instruction with models to review the exercise physiology of the cardiovascular system and associated critical values. Provides general exercise guidelines with specific guidelines to those with heart or lung disease.  Flowsheet Row Pulmonary Rehab from 08/23/2022 in [facility] Cardiac and Pulmonary Rehab  Date 06/28/22  Educator [facility]  Instruction Review Code 1- Verbalizes Understanding       Education: Flexibility, Balance, Mind/Body Relaxation: - Group verbal and visual presentation with interactive activity on the components of exercise prescription. Introduces F.I.T.T principle from ACSM for exercise prescriptions. Reviews F.I.T.T. principles of flexibility and balance exercise training including progression. Also discusses the mind body connection.  Reviews various relaxation techniques to help reduce and manage stress (i.e. Deep breathing, progressive muscle relaxation, and visualization). Balance handout provided to take home. Written material  given at graduation.   Activity Barriers & Risk  Stratification:  Activity Barriers & Cardiac Risk Stratification - 05/23/22 1452       Activity Barriers & Cardiac Risk Stratification   Activity Barriers Right Hip Replacement;Deconditioning;Muscular Weakness;Shortness of Breath;Decreased Ventricular Function             6 Minute Walk:  6 Minute Walk     Row Name 05/23/22 1450 08/23/22 1005       6 Minute Walk   Phase Initial Discharge    Distance 990 feet 1225 feet    Distance % Change -- 23.7 %    Distance Feet Change -- 235 ft    Walk Time 6 minutes 6 minutes    # of Rest Breaks 0 0    MPH 1.87 2.32    METS 1.62 1.96    RPE 15 16    Perceived Dyspnea  3 3    VO2 Peak 5.67 6.87    Symptoms Yes (comment) Yes (comment)    Comments Right hip tightness, SOB SOB    Resting HR 72 bpm 73 bpm    Resting BP 106/66 146/74    Resting Oxygen Saturation  93 % 90 %    Exercise Oxygen Saturation  during 6 min walk 87 % 86 %    Max Ex. HR 90 bpm 94 bpm    Max Ex. BP 124/62 122/64    2 Minute Post BP 104/66 --      Interval HR   1 Minute HR 83 73    2 Minute HR 85 82    3 Minute HR 87 91    4 Minute HR 88 94    5 Minute HR 89 94    6 Minute HR 90 94    2 Minute Post HR 77 --    Interval Heart Rate? Yes Yes      Interval Oxygen   Interval Oxygen? Yes Yes    Baseline Oxygen Saturation % 93 % 90 %    1 Minute Oxygen Saturation % 92 % 90 %    1 Minute Liters of Oxygen 3 L 2 L  Pulsed    2 Minute Oxygen Saturation % 88 % 86 %    2 Minute Liters of Oxygen 3 L 2 L  Pulsed    3 Minute Oxygen Saturation % 87 % 86 %    3 Minute Liters of Oxygen 3 L 2 L  Pulsed    4 Minute Oxygen Saturation % 88 % 86 %    4 Minute Liters of Oxygen 3 L 2 L  Pulsed    5 Minute Oxygen Saturation % 87 % 86 %    5 Minute Liters of Oxygen 3 L 2 L  Pulsed    6 Minute Oxygen Saturation % 87 % 86 %    6 Minute Liters of Oxygen 3 L 2 L  Pulsed    2 Minute Post Oxygen Saturation % 94 % --    2 Minute Post Liters of Oxygen 3 L --             Oxygen Initial Assessment:  Oxygen Initial Assessment - 08/14/22 1011       Home Oxygen   Home Oxygen Device Home Concentrator;E-Tanks;Portable Concentrator    Sleep Oxygen Prescription Continuous    Liters per minute 2.5    Home Exercise Oxygen Prescription Continuous    Liters per minute 2    Home Resting Oxygen Prescription  Continuous    Liters per minute 2.5    Compliance with Home Oxygen Use Yes      Program Oxygen Prescription   Program Oxygen Prescription Continuous    Liters per minute 2   Decreased from 3L to 2L     Intervention   Short Term Goals To learn and demonstrate proper use of respiratory medications;To learn and understand importance of monitoring SPO2 with pulse oximeter and demonstrate accurate use of the pulse oximeter.;To learn and exhibit compliance with exercise, home and travel O2 prescription;To learn and understand importance of maintaining oxygen saturations>88%    Long  Term Goals Demonstrates proper use of MDI's;Maintenance of O2 saturations>88%;Compliance with respiratory medication;Exhibits proper breathing techniques, such as pursed lip breathing or other method taught during program session;Verbalizes importance of monitoring SPO2 with pulse oximeter and return demonstration             Oxygen Re-Evaluation:  Oxygen Re-Evaluation     Row Name 05/24/22 1029 06/16/22 1008 07/17/22 1007 08/14/22 1011 08/14/22 1012     Program Oxygen Prescription   Program Oxygen Prescription -- Continuous -- -- --   Liters per minute -- 3 -- -- --     Home Oxygen   Home Oxygen Device -- Home Concentrator;E-Tanks;Portable Concentrator -- -- --   Sleep Oxygen Prescription -- Continuous -- -- --   Liters per minute -- 2.5 -- -- --   Home Exercise Oxygen Prescription -- Continuous -- -- --   Liters per minute -- 3 -- -- --   Home Resting Oxygen Prescription -- Continuous -- -- --   Liters per minute -- 2 -- -- --   Compliance with Home Oxygen Use -- Yes --  -- --     Goals/Expected Outcomes   Short Term Goals -- To learn and demonstrate proper use of respiratory medications -- -- --   Long  Term Goals -- Demonstrates proper use of MDI's -- -- --   Comments Reviewed PLB technique with pt.  Talked about how it works and it's importance in maintaining their exercise saturations. -- Keirra is doing well and watches her O2 consistently. She usually eanges 94-95% at rest and typically 91-92% when she is active. She forgets to PLB sometimes but has been trying to get better at getting in the habit of doing it. She is in the process of finding a new Pulmonologist. Lashaunta has been sick and slowly starting to get back into the groove of things again. She still watches her O2 diligently, usually it ranges 94-95% at rest. with activity it can drop to anywhere between 89-92%. [place] had [place] far distance and ended up dropping to 84%, she luckily has her pulse ox with her. She stopped and rested and PLB and it went back up to 90%. She watches her O2 consistently and taking all of her medications and inhalers as needed. She is going to finish the program after the holidays. --   Goals/Expected Outcomes Short: Become more profiecient at using PLB.   Long: Become independent at using PLB. -- Short: Keep practicing PLB Long: Become independent at using PLB Short: Continue to monitor oxygen saturations Long: Become proficient at [place] Name 08/30/22 1014             Program Oxygen Prescription   Program Oxygen Prescription Continuous       Liters per minute 2  Home Oxygen   Home Oxygen Device Home Concentrator;E-Tanks;Portable Concentrator       Sleep Oxygen Prescription Continuous       Liters per minute 2.5       Home Exercise Oxygen Prescription Continuous       Liters per minute 2       Home Resting Oxygen Prescription Continuous       Liters per minute 2.5       Compliance with Home Oxygen Use Yes          Goals/Expected Outcomes   Short Term Goals To learn and demonstrate proper use of respiratory medications;To learn and understand importance of monitoring SPO2 with pulse oximeter and demonstrate accurate use of the pulse oximeter.;To learn and exhibit compliance with exercise, home and travel O2 prescription;To learn and understand importance of maintaining oxygen saturations>88%;To learn and demonstrate proper pursed lip breathing techniques or other breathing techniques.        Long  Term Goals Demonstrates proper use of MDI's;Maintenance of O2 saturations>88%;Compliance with respiratory medication;Exhibits proper breathing techniques, such as pursed lip breathing or other method taught during program session;Verbalizes importance of monitoring SPO2 with pulse oximeter and return demonstration;Exhibits compliance with exercise, home  and travel O2 prescription       Comments Oliviarose is doing better with her breathing and  staying compliant with her oxygen therapy. She always takes her pulmonary meds first and feels better with them.  She will conitnue to work with her pulmonolpgist as well.       Goals/Expected Outcomes Continued compliance and PLB use                Oxygen Discharge (Final Oxygen Re-Evaluation):  Oxygen Re-Evaluation - 08/30/22 1014       Program Oxygen Prescription   Program Oxygen Prescription Continuous    Liters per minute 2      Home Oxygen   Home Oxygen Device Home Concentrator;E-Tanks;Portable Concentrator    Sleep Oxygen Prescription Continuous    Liters per minute 2.5    Home Exercise Oxygen Prescription Continuous    Liters per minute 2    Home Resting Oxygen Prescription Continuous    Liters per minute 2.5    Compliance with Home Oxygen Use Yes      Goals/Expected Outcomes   Short Term Goals To learn and demonstrate proper use of respiratory medications;To learn and understand importance of monitoring SPO2 with pulse oximeter and demonstrate accurate  use of the pulse oximeter.;To learn and exhibit compliance with exercise, home and travel O2 prescription;To learn and understand importance of maintaining oxygen saturations>88%;To learn and demonstrate proper pursed lip breathing techniques or other breathing techniques.     Long  Term Goals Demonstrates proper use of MDI's;Maintenance of O2 saturations>88%;Compliance with respiratory medication;Exhibits proper breathing techniques, such as pursed lip breathing or other method taught during program session;Verbalizes importance of monitoring SPO2 with pulse oximeter and return demonstration;Exhibits compliance with exercise, home  and travel O2 prescription    Comments Kinslie is doing better with her breathing and  staying compliant with her oxygen therapy. She always takes her pulmonary meds first and feels better with them.  She will conitnue to work with her pulmonolpgist as well.    Goals/Expected Outcomes Continued compliance and PLB use             Initial Exercise Prescription:  Initial Exercise Prescription - 05/23/22 1400       Date of Initial Exercise RX and Referring Provider  Date 05/23/22    Referring Provider Clayborn Bigness MD      Oxygen   Oxygen Continuous    Liters 3    Maintain Oxygen Saturation 88% or higher      Treadmill   MPH 1.4    Grade 0    Minutes 15    METs 2.07      NuStep   Level 1    SPM 80    Minutes 15    METs 1.6      REL-XR   Level 1    Speed 50    Minutes 15    METs 1.6      Prescription Details   Frequency (times per week) 3    Duration Progress to 30 minutes of continuous aerobic without signs/symptoms of physical distress      Intensity   THRR 40-80% of Max Heartrate 100 - 129    Ratings of Perceived Exertion 11-13    Perceived Dyspnea 0-4      Progression   Progression Continue to progress workloads to maintain intensity without signs/symptoms of physical distress.      Resistance Training   Training Prescription Yes     Weight 3 lb    Reps 10-15             Perform Capillary Blood Glucose checks as needed.  Exercise Prescription Changes:   Exercise Prescription Changes     Row Name 05/23/22 1400 06/06/22 1400 06/20/22 1400 07/04/22 1400 07/17/22 1000     Response to Exercise   Blood Pressure (Admit) 106/66 126/58 110/60 108/60 --   Blood Pressure (Exercise) 124/62 136/72 126/64 -- --   Blood Pressure (Exit) 104/66 116/58 124/80 124/70 --   Heart Rate (Admit) 72 bpm 70 bpm 65 bpm 73 bpm --   Heart Rate (Exercise) 90 bpm 86 bpm 87 bpm 83 bpm --   Heart Rate (Exit) 65 bpm 79 bpm 67 bpm 74 bpm --   Oxygen Saturation (Admit) 93 % 95 % 95 % 92 % --   Oxygen Saturation (Exercise) 87 % 92 % 92 % 90 % --   Oxygen Saturation (Exit) 94 % 94 % 98 % 95 % --   Rating of Perceived Exertion (Exercise) '15 13 13 14 '$ --   Perceived Dyspnea (Exercise) '3 3 3 3 '$ --   Symptoms SOB, right hip tightness SOB SOB SOB --   Comments walk test results -- -- -- --   Duration -- Progress to 30 minutes of  aerobic without signs/symptoms of physical distress Continue with 30 min of aerobic exercise without signs/symptoms of physical distress. Continue with 30 min of aerobic exercise without signs/symptoms of physical distress. --   Intensity -- THRR unchanged THRR unchanged THRR unchanged --     Progression   Progression -- Continue to progress workloads to maintain intensity without signs/symptoms of physical distress. Continue to progress workloads to maintain intensity without signs/symptoms of physical distress. Continue to progress workloads to maintain intensity without signs/symptoms of physical distress. --   Average METs -- 2.37 2.49 2.53 --     Resistance Training   Training Prescription -- Yes Yes Yes --   Weight -- 3 lb 3 lb 3 lb --   Reps -- 10-15 10-15 10-15 --     Interval Training   Interval Training -- No No No --     Oxygen   Oxygen -- Continuous Continuous Continuous --   Liters -- '3 3 3 '$ --  Treadmill   MPH -- 2 2.2 2 --   Grade -- 0 0 0 --   Minutes -- '15 15 15 '$ --   METs -- 2.53 2.68 2.38 --     NuStep   Level -- '3 3 4 '$ --   Minutes -- '15 15 15 '$ --   METs -- 2.3 2.3 3 --     REL-XR   Level -- '1 1 6 '$ --   Minutes -- '15 15 15 '$ --   METs -- 3 2.5 2 --     Home Exercise Plan   Plans to continue exercise at -- -- -- -- [facility] [employer] (comment)  Neurosurgeon   Frequency -- -- -- -- Add 2 additional days to program exercise sessions.   Initial Home Exercises Provided -- -- -- -- 07/17/22     Oxygen   Maintain Oxygen Saturation -- 88% or higher 88% or higher 88% or higher 88% or higher    Row Name 07/17/22 1500 08/01/22 1500 08/29/22 1500         Response to Exercise   Blood Pressure (Admit) 100/58 102/58 146/74     Blood Pressure (Exit) 124/64 102/60 102/60     Heart Rate (Admit) 74 bpm 67 bpm 75 bpm     Heart Rate (Exercise) 90 bpm 91 bpm 96 bpm     Heart Rate (Exit) 82 bpm 69 bpm 77 bpm     Oxygen Saturation (Admit) 94 % 92 % 91 %     Oxygen Saturation (Exercise) 92 % 91 % 86 %     Oxygen Saturation (Exit) 95 % 94 % 91 %     Rating of Perceived Exertion (Exercise) '13 15 15     '$ Perceived Dyspnea (Exercise) '2 3 3     '$ Symptoms SOB SOB SOB     Duration Continue with 30 min of aerobic exercise without signs/symptoms of physical distress. Continue with 30 min of aerobic exercise without signs/symptoms of physical distress. Continue with 30 min of aerobic exercise without signs/symptoms of physical distress.     Intensity THRR unchanged THRR unchanged THRR unchanged       Progression   Progression Continue to progress workloads to maintain intensity without signs/symptoms of physical distress. Continue to progress workloads to maintain intensity without signs/symptoms of physical distress. Continue to progress workloads to maintain intensity without signs/symptoms of physical distress.     Average METs 3.06 3.04 3.57       Resistance Training   Training  Prescription Yes Yes Yes     Weight 3 lb 3 lb 3 lb     Reps 10-15 10-15 10-15       Interval Training   Interval Training No No No       Oxygen   Oxygen Continuous Continuous Continuous     Liters 3 2-3 2       Treadmill   MPH 2 2.2 2.2     Grade 1.'5 1 5     '$ Minutes '15 15 15     '$ METs 2.81 2.99 4.2       Recumbant Bike   Level -- 2 --     Minutes -- 15 --     METs -- 2 --       NuStep   Level '7 7 7     '$ Minutes '15 15 15     '$ METs 3.6 3.6 3.2       REL-XR   Level 7 6 --  Minutes 15 15 --     METs 3.5 3.4 --       T5 Nustep   Level -- 6 --     Minutes -- 15 --     METs -- 2.3 --       Biostep-RELP   Level -- -- 5     Minutes -- -- 15     METs -- -- 3       Home Exercise Plan   Plans to continue exercise at [employer] (comment)  Editor, commissioning (comment)  Editor, commissioning (comment)  [employer]     Frequency Add 2 additional days to program exercise sessions. Add 2 additional days to program exercise sessions. Add 2 additional days to program exercise sessions.     Initial Home Exercises Provided 07/17/22 07/17/22 07/17/22       Oxygen   Maintain Oxygen Saturation 88% or higher 88% or higher 88% or higher              Exercise Comments:   Exercise Goals and Review:   Exercise Goals     Row Name 05/23/22 1456             Exercise Goals   Increase Physical Activity Yes       Intervention Provide advice, education, support and counseling about physical activity/exercise needs.;Develop an individualized exercise prescription for aerobic and resistive training based on initial evaluation findings, risk stratification, comorbidities and participant's personal goals.       Expected Outcomes Short Term: Attend rehab on a regular basis to increase amount of physical activity.;Long Term: Add in home exercise to make exercise part of routine and to increase amount of physical activity.;Long Term: Exercising  regularly at least 3-5 days a week.       Increase Strength and Stamina Yes       Intervention Provide advice, education, support and counseling about physical activity/exercise needs.;Develop an individualized exercise prescription for aerobic and resistive training based on initial evaluation findings, risk stratification, comorbidities and participant's personal goals.       Expected Outcomes Short Term: Increase workloads from initial exercise prescription for resistance, speed, and METs.;Short Term: Perform resistance training exercises routinely during rehab and add in resistance training at home;Long Term: Improve cardiorespiratory fitness, muscular endurance and strength as measured by increased METs and functional capacity (6MWT)       Able to understand and use rate of perceived exertion (RPE) scale Yes       Intervention Provide education and explanation on how to use RPE scale       Expected Outcomes Short Term: Able to use RPE daily in rehab to express subjective intensity level;Long Term:  Able to use RPE to guide intensity level when exercising independently       Able to understand and use Dyspnea scale Yes       Intervention Provide education and explanation on how to use Dyspnea scale       Expected Outcomes Short Term: Able to use Dyspnea scale daily in rehab to express subjective sense of shortness of breath during exertion;Long Term: Able to use Dyspnea scale to guide intensity level when exercising independently       Knowledge and understanding of Target Heart Rate Range (THRR) Yes       Intervention Provide education and explanation of THRR including how the numbers were predicted and where they are located for reference       Expected  Outcomes Short Term: Able to state/look up THRR;Long Term: Able to use THRR to govern intensity when exercising independently;Short Term: Able to use daily as guideline for intensity in rehab       Able to check pulse independently Yes        Intervention Provide education and demonstration on how to check pulse in carotid and radial arteries.;Review the importance of being able to check your own pulse for safety during independent exercise       Expected Outcomes Short Term: Able to explain why pulse checking is important during independent exercise;Long Term: Able to check pulse independently and accurately       Understanding of Exercise Prescription Yes       Intervention Provide education, explanation, and written materials on patient's individual exercise prescription       Expected Outcomes Short Term: Able to explain program exercise prescription;Long Term: Able to explain home exercise prescription to exercise independently                Exercise Goals Re-Evaluation :  Exercise Goals Re-Evaluation     Row Name 05/24/22 1010 06/06/22 1416 06/20/22 1434 07/04/22 1416 07/17/22 1011     Exercise Goal Re-Evaluation   Exercise Goals Review -- Increase Physical Activity;Increase Strength and Stamina;Understanding of Exercise Prescription Increase Physical Activity;Increase Strength and Stamina;Understanding of Exercise Prescription Increase Physical Activity;Increase Strength and Stamina;Understanding of Exercise Prescription Increase Physical Activity;Increase Strength and Stamina;Understanding of Exercise Prescription   Comments Reviewed RPE and dyspnea scales, THR and program prescription with pt today.  Pt voiced understanding and was given a copy of goals to take home. Clela is doing well with rehab. She had an overall average MET level of 2.37 METs. She also increased her speed on the treadmill up to 2 mph. She also improved to level 3 on the T4. We will continue to monitor her progress in the program. Shawnta continues to do well in rehab. She did increase her treadmill speed to 2.2 mph. her oxygen saturations are staying above 88% with exercise. She has been at a consistent level 1 on the XR and would benefit from increasing  that. We will continue to monitor. Sameeha continues to do well in rehab. She recently increased her average overall MET level to 2.53 METs. She also improved to level 4 on the T4 and level 6 on the XR. We will continue to monitor her progress in the program. --   Expected Outcomes Short: Use RPE daily to regulate intensity. Long: Follow program prescription in THR. Short: Begin to add incline on the treadmill. Long: Continue to increase strength and stamina. Short: Increase level on XR Long: Continue to increase overall MET level Short: Continue to increase workloads as tolerated. Long: Continue to increase strength and stamina. --    Row Name 07/17/22 1052 07/17/22 1517 08/01/22 1509 08/14/22 0953 08/29/22 1515     Exercise Goal Re-Evaluation   Exercise Goals Review Increase Physical Activity;Increase Strength and Stamina;Understanding of Exercise Prescription;Able to understand and use rate of perceived exertion (RPE) scale;Able to check pulse independently;Knowledge and understanding of Target Heart Rate Range (THRR) Increase Physical Activity;Increase Strength and Stamina;Understanding of Exercise Prescription Increase Physical Activity;Increase Strength and Stamina;Understanding of Exercise Prescription Increase Physical Activity;Increase Strength and Stamina;Understanding of Exercise Prescription Increase Physical Activity;Increase Strength and Stamina;Understanding of Exercise Prescription   Comments Reviewed home exercise with pt today.  Pt plans to go to [employer] for exercise. Right now, she is going 2 days in addition  to rehab/ week. She does a mix of treadmill, bike, arm crank, and seated steppers. She does more of her bodyweight strength training at home as she states the machines at the gym are always used. Reviewed THR, pulse, RPE, sign and symptoms, pulse oximetery and when to call 911 or MD.  Also discussed weather considerations and indoor options.  Pt voiced understanding. Jazzmyne  continues to do well in rehab. She increased to level 7 on both the T4 Nustep and and XR. She also added a 1.5% incline to her treadmill. All in all, her overall METs increased to over 3! She would benefit from increasing to 4 lb handweights. Will continue to monitor. Anarie is doing well in rehab. She is due for her post 6MWT and will look to improve on that. She also has continued to work at an average MET level above 3 METs. She was able to increase her speed on the treadmill to 2.2 mph while maintaining an incline of 1% as well. We will continue to monitor her progress in the program. Catlynn has been out for the last couple of weeks due to being sick and therefore has not been here since last review. Her son had RSV and she did not get it, but still had sick symptoms so therefore she has not been able to work out. Prior to this, she was going to [employer], up to 6 days/ week. [place] a mix of cardio and resistance training. She is going to slowly ease back into her exercise routine since she has been down and sick for 2 weeks. She is starting off with rehab sessions this week and then slowly starting to introduce a day at a time with [employer]. She knows to listen to her body. She is also due for her post 6MWT next week and we hope to see improvement. Muna is doing well in rehab. She recenlty completed her post 6MWT and improved by 23.7%! She also was able to increase her overall average MET level to 3.57 METs. She was able to increase her workload on the treadmill as well, by going up to 5% on her incline while maintaining the speed at 2.2 mph. We will continue to monitor her progress until she graduates from the program.   Expected Outcomes Short: Continue with 2 days of exercise at home, continue to watch O2 and HR during Long: Continue to exercise independently Short: Increase to 4 lb handweights Long: Continue to increase overall MET level Short: Improve on post 6MWT. Long: Continue to increase  strength and stamina. Short: Ease back into [employer] routine Long: Graduate from [employer] and continue to exercise independently at home Short: Graduate. Long: Continue to exercise independently.    [place] Name 08/30/22 1000             Exercise Goal Re-Evaluation   Exercise Goals Review Increase Physical Activity;Increase Strength and Stamina;Understanding of Exercise Prescription       Comments Renate is planning to go to [employer] after graduating next week.  Her biggest concern is not having someone making her go.  She enjoyed the program and the rountine it helped establish.       Expected Outcomes Short: Graduate. Long: Continue to exercise independently.                Discharge Exercise Prescription (Final Exercise Prescription Changes):  Exercise Prescription Changes - 08/29/22 1500       Response to Exercise   Blood Pressure (Admit)  146/74    Blood Pressure (Exit) 102/60    Heart Rate (Admit) 75 bpm    Heart Rate (Exercise) 96 bpm    Heart Rate (Exit) 77 bpm    Oxygen Saturation (Admit) 91 %    Oxygen Saturation (Exercise) 86 %    Oxygen Saturation (Exit) 91 %    Rating of Perceived Exertion (Exercise) 15    Perceived Dyspnea (Exercise) 3    Symptoms SOB    Duration Continue with 30 min of aerobic exercise without signs/symptoms of physical distress.    Intensity THRR unchanged      Progression   Progression Continue to progress workloads to maintain intensity without signs/symptoms of physical distress.    Average METs 3.57      Resistance Training   Training Prescription Yes    Weight 3 lb    Reps 10-15      Interval Training   Interval Training No      Oxygen   Oxygen Continuous    Liters 2      Treadmill   MPH 2.2    Grade 5    Minutes 15    METs 4.2      NuStep   Level 7    Minutes 15    METs 3.2      Biostep-RELP   Level 5    Minutes 15    METs 3      Home Exercise Plan   Plans to continue exercise at [employer]  (comment)   [employer]   Frequency Add 2 additional days to program exercise sessions.    Initial Home Exercises Provided 07/17/22      Oxygen   Maintain Oxygen Saturation 88% or higher             Nutrition:  Target Goals: Understanding of nutrition guidelines, daily intake of sodium '1500mg'$ , cholesterol '200mg'$ , calories 30% from fat and 7% or less from saturated fats, daily to have 5 or more servings of fruits and vegetables.  Education: All About Nutrition: -Group instruction provided by verbal, written material, interactive activities, discussions, models, and posters to present general guidelines for heart healthy nutrition including fat, fiber, MyPlate, the role of sodium in heart healthy nutrition, utilization of the nutrition label, and utilization of this knowledge for meal planning. Follow up email sent as well. Written material given at graduation. Flowsheet Row Pulmonary Rehab from 08/23/2022 in [facility] Cardiac and Pulmonary Rehab  Date 07/26/22  Educator [facility]  Instruction Review Code 1- Verbalizes Understanding       Biometrics:  Pre Biometrics - 05/23/22 1449       Pre Biometrics   Height 5' 4.6" (1.641 m)    Weight 186 lb 6.4 oz (84.6 kg)    BMI (Calculated) 31.4    Single Leg Stand 7.3 seconds             Post Biometrics - 08/23/22 1010        Post  Biometrics   Height 5' 4.6" (1.641 m)    Weight 191 lb 6.4 oz (86.8 kg)    BMI (Calculated) 32.24    Single Leg Stand 3.8 seconds             Nutrition Therapy Plan and Nutrition Goals:  Nutrition Therapy & Goals - 06/05/22 1301       Nutrition Therapy   Diet Heart healthy, low Na, T2DM MNT, Pulmonary MNT    Protein (specify units) 95-100g    Fiber 28  grams    Whole Grain Foods 3 servings    Saturated Fats 15 max. grams    Fruits and Vegetables 8 servings/day    Sodium 1.5 grams      Personal Nutrition Goals   Nutrition Goal ST: practice MyPlate guidelines, try out sourdough bread or  whole grain bakery bread LT: meet protein/energy needs, maintain A1c < 7, limit Na <1.5g/day, meet fiber needs of at least 28g/day    Comments 77 y.o. F admitted to pulmonary rehab for COPD. PMHx includes GERD, T2DM (recent A1C 6.2), OSA, osteoporosis, anxiety, arthritis, hard of hearing with hearing aids, CKD (no stage on file). PSHx cholecystectomy, rotator cuff repair, THA. Relevant medications includes xanax, lipitor, wellbutrin, vit D3 and ergocalciferol, cranberry tablets, farxiga, flovent, furosemide, magnesium, metformin, metolazone, MVI with minerals, macrobid, 3L O2, omeprazole, K+, prednisone, simethicone, torsemide. PYP Score: 68. Vegetables & Fruits 7/12. Breads, Grains & Cereals 8/12. Red & Processed Meat 8/12. Poultry 2/2. Fish & Shellfish 1/4. Beans, Nuts & Seeds 2/4. Milk & Dairy Foods 3/6. Toppings, Oils, Seasonings & Salt 13/20. Sweets, Snacks & Restaurant Food 14/14. Beverages 10/10.  Today had leftover asparagus with salami and egg with bread roll. she also enjoys eggs on toast (arnold white bread - this is due to price as she does not feel the whole wheat is good quality. She would be open to getting bakery sourdough bread - she likes [employer]. She will take her fluid pill later in the day when she has to run errands, has plans, or when she comes to rehab which can cause her to be up all night. When she goes out to eat at an [place] instead of fast food. She will go out to eat at least 2x/week (once with her son and his wife). She prefers to make food at home. She reports having at least 1 vegetable serving per day and sometimes with have shredded carrots or cababge with sugar and lemon for dessert. She reports being mindful of her sodium and does not use any with cooking.  Reviewed heart healthy eating, diabetes friendly eating, and pulmonary MNT.      Intervention Plan   Intervention Prescribe, educate and counsel regarding individualized specific dietary  modifications aiming towards targeted core components such as weight, hypertension, lipid management, diabetes, heart failure and other comorbidities.    Expected Outcomes Short Term Goal: Understand basic principles of dietary content, such as calories, fat, sodium, cholesterol and nutrients.;Short Term Goal: A plan has been developed with personal nutrition goals set during dietitian appointment.;Long Term Goal: Adherence to prescribed nutrition plan.             Nutrition Assessments:  MEDIFICTS Score Key: ?70 Need to make dietary changes  40-70 Heart Healthy Diet ? 40 Therapeutic Level Cholesterol Diet  Flowsheet Row Pulmonary Rehab from 09/04/2022 in [facility] Cardiac and Pulmonary Rehab  Picture Your Plate Total Score on Discharge 63      Picture Your Plate Scores: <97 Unhealthy dietary pattern with much room for improvement. 41-50 Dietary pattern unlikely to meet recommendations for good health and room for improvement. 51-60 More healthful dietary pattern, with some room for improvement.  >60 Healthy dietary pattern, although there may be some specific behaviors that could be improved.   Nutrition Goals Re-Evaluation:  Nutrition Goals Re-Evaluation     Row Name 06/16/22 1013 07/17/22 0955 07/25/22 1033 08/14/22 1005 08/30/22 1010     Goals   Current Weight 190 lb (86.2 kg) 190 lb (86.2  kg) -- -- --   Nutrition Goal Lose some weight ST: practice MyPlate guidelines, try out sourdough bread or whole grain bakery bread LT: meet protein/energy needs, maintain A1c < 7, limit Na <1.5g/day, meet fiber needs of at least 28g/day ST: Modify meals using ideas from this RD - specifics in comment LT: meet protein/energy needs, maintain A1c < 7, limit Na <1.5g/day, meet fiber needs of at least 28g/day ST: Modify meals using ideas from this RD - specifics in comment LT: meet protein/energy needs, maintain A1c < 7, limit Na <1.5g/day, meet fiber needs of at least 28g/day ST: Modify meals using  ideas from this RD - specifics in comment LT: meet protein/energy needs, maintain A1c < 7, limit Na <1.5g/day, meet fiber needs of at least 28g/day   Comment Patient was informed on why it is important to maintain a balanced diet when dealing with Respiratory issues. Explained that it takes a lot of energy to breath and when they are short of breath often they will need to have a good diet to help keep up with the calories they are expending for breathing. Ellen states she is frustrated because she has gained at least 3 lb, although she has been trying to lose. Currently, she is watching sodium intake by looking at labels and is trying to eat  more fruit as snacks to keep her satieted. Specifically - banaanas, apples, pomegrantate whic she is even putting on her on salads. She does have any appt with the RD again on 11/28 to discuss further details. Mikeya has made changes since first meeting with this RD including snacking on fruit and using whole wheat and sourdough bread. She would like to lose weight - discusssed behavior changes that can help and discussed swaps and modifications to her current meals. New food recall: B: 1/3 cup honey bunches of oat cereal with whole milk and fruit or an egg with butter on 1 slice of whole wheat or sourdough toast sometimes with peppers and onions and a glass of OJ. Recommended switching to a higher fiber cereal - suggested Kashi (honey almond flax crunch) as this would be similar in crunch and flavor with more fiber, protein, and some heart healthy fats. Encouraged Ivey to have at least 1/2 cup of cereal instead of 1/3 cup which can cause her to be hungry soon after; a serving size is 3/4 cup  of the Kashhi cereal. Suggested Baylin choose a piece of whole fruit instead of OJ at least half of the time as this will provide more fiber and less sugar per serving. Encouraged Janaki to include non-starchy vegetables like peppers and onions most of the time instead of some of the  time. L: sandwich made with [place] cheese (she is unsure of the type of cheese this is, but likens it to [employer]), 2 hot dogs and a slice of bread, salad with an egg and ranch or blue cheese dressing, or homemade soup such as potato soup (uses hot dogsin this soup) or chili (made with pork loin and beans). Suggested a tuna sandwich (limit to 1-2x/week) or a low-sodium [place] sanndwich on whole wheat bread for an easy meal instead of hot dogs. Suggested using a small amount of real blue cheese and then dressed her salad with olive oil and vinegar/lemon or use low sodium [place] dressing as she likes that as well. Suggested pairing her soup with some non-starchy vegetables such as cabbage, broccoli, salad greens, etc. D: She normally has a protein and  vegetable or grain. Protein such as chicken (airfryer), steak and fish with butter in a pan, pork chops. Vegetables such as potatoes, sweet potatoes with butter and brown sugar, corn, red cabbage, and peas. Grains such as egg noodles. Discussed MyPlate proportions, advised to choose lean proteins like fish and chicken more often than red meat, use mostly liquid plant oils instead of butter with cooking, and to vary non-starchy vegetables. Suggested snacks like nuts/seeds with her fruit or 1/2 sandiwch on whole wheat bread to include fiber, healthy fat, and protein helping to keep her satiated during the day. Jaclene admits she has not made any huge diet changes that was discussed with the RD as she has not made it a priority and states she has had a lot going on. She is still trying to lose weight but has not exercised lately due to being sick. She did state she has been focusing a lot on her salt intake and monitors her sodium closely. She can tell if she obtains fluid, and knows to watch her weight diligently for any potentialy fluid gain. She takes her diuretic. Her sugar is always elevated she thinks from medication side effects. She was encouraged to work on 1  goal established by the RD as there were many discuseed. Cecilia continue to work on adding variety and is finally back to a normal diet again.  Se will continue to work on getting in everything that she needs.   Expected Outcome Short: Choose and plan snacks accordingly to patients caloric intake to improve breathing. Long: Maintain a diet independently that meets their caloric intake to aid in daily shortness of breath. Short: Talk with RD on 11/28 Long: Continue to eat a healthy pulmonary based diet ST: Modify meals using ideas from this RD - specifics in comment LT: meet protein/energy needs, maintain A1c < 7, limit Na <1.5g/day, meet fiber needs of at least 28g/day Short: Start working on 1 RD goal and notify staff of any changes Long: Continue to follow healthy pulmonary based diet Continue to follow healthy pulmonary diet            Nutrition Goals Discharge (Final Nutrition Goals Re-Evaluation):  Nutrition Goals Re-Evaluation - 08/30/22 1010       Goals   Nutrition Goal ST: Modify meals using ideas from this RD - specifics in comment LT: meet protein/energy needs, maintain A1c < 7, limit Na <1.5g/day, meet fiber needs of at least 28g/day    Comment Lilith continue to work on adding variety and is finally back to a normal diet again.  Se will continue to work on getting in everything that she needs.    Expected Outcome Continue to follow healthy pulmonary diet             Psychosocial: Target Goals: Acknowledge presence or absence of significant depression and/or stress, maximize coping skills, provide positive support system. Participant is able to verbalize types and ability to use techniques and skills needed for reducing stress and depression.   Education: Stress, Anxiety, and Depression - Group verbal and visual presentation to define topics covered.  Reviews how body is impacted by stress, anxiety, and depression.  Also discusses healthy ways to reduce stress and to treat/manage  anxiety and depression.  Written material given at graduation. Flowsheet Row Pulmonary Rehab from 08/23/2022 in [facility] Cardiac and Pulmonary Rehab  Date 06/21/22  Educator [facility]  Instruction Review Code 1- [employer] Understanding       Education: Sleep Hygiene -Provides group verbal  and written instruction about how sleep can affect your health.  Define sleep hygiene, discuss sleep cycles and impact of sleep habits. Review good sleep hygiene tips.    Initial Review & Psychosocial Screening:  Initial Psych Review & Screening - 05/15/22 1054       Initial Review   Current issues with Current Psychotropic Meds      Family Dynamics   Good Support System? Yes    Comments She took WellButrin for awhile to quit smoking and she did. She has a good family support system.      Barriers   Psychosocial barriers to participate in program The patient should benefit from training in stress management and relaxation.      Screening Interventions   Interventions Encouraged to exercise;To provide support and resources with identified psychosocial needs;Provide feedback about the scores to participant    Expected Outcomes Short Term goal: Utilizing psychosocial counselor, staff and physician to assist with identification of specific Stressors or current issues interfering with healing process. Setting desired goal for each stressor or current issue identified.;Long Term Goal: Stressors or current issues are controlled or eliminated.;Short Term goal: Identification and review with participant of any Quality of Life or Depression concerns found by scoring the questionnaire.;Long Term goal: The participant improves quality of Life and PHQ9 Scores as seen by post scores and/or verbalization of changes             Quality of Life Scores:  Scores of 19 and below usually indicate a poorer quality of life in these areas.  A difference of  2-3 points is a clinically meaningful difference.  A difference of 2-3  points in the total score of the Quality of Life Index has been associated with significant improvement in overall quality of life, self-image, physical symptoms, and general health in studies assessing change in quality of life.  PHQ-9: Review Flowsheet  More data exists      09/05/2022 09/04/2022 07/10/2022 06/16/2022 05/23/2022  Depression screen PHQ 2/9  Decreased Interest 0 0 0 0 1  Down, Depressed, Hopeless 0 0 1 0 0  PHQ - 2 Score 0 0 1 0 1  Altered sleeping - 0 0 1 3  Tired, decreased energy - '2 2 3 2  '$ Change in appetite - 0 '1 3 3  '$ Feeling bad or failure about yourself  - 0 0 0 0  Trouble concentrating - 0 0 0 2  Moving slowly or fidgety/restless - 0 0 0 0  Suicidal thoughts - 0 0 0 0  PHQ-9 Score - '2 4 7 11  '$ Difficult doing work/chores - Not difficult at all Not difficult at all Somewhat difficult Not difficult at all   Interpretation of Total Score  Total Score Depression Severity:  1-4 = Minimal depression, 5-9 = Mild depression, 10-14 = Moderate depression, 15-19 = Moderately severe depression, 20-27 = Severe depression   Psychosocial Evaluation and Intervention:  Psychosocial Evaluation - 05/15/22 1056       Psychosocial Evaluation & Interventions   Interventions Encouraged to exercise with the program and follow exercise prescription;Stress management education;Relaxation education    Comments She took WellButrin for awhile to quit smoking and she did. She has a good family support system.    Expected Outcomes Short: Start [facility] to help with mood. Long: Maintain a healthy mental state.    Continue Psychosocial Services  Follow up required by staff             Psychosocial Re-Evaluation:  Psychosocial Re-Evaluation     Row Name 06/16/22 1024 07/17/22 1003 08/14/22 1014 08/30/22 1006       Psychosocial Re-Evaluation   Current issues with Current Stress Concerns;Current Psychotropic Meds;Current Anxiety/Panic Current Psychotropic Meds;Current  Anxiety/Panic;Current Stress Concerns Current Psychotropic Meds;Current Anxiety/Panic;Current Stress Concerns Current Psychotropic Meds;Current Anxiety/Panic;Current Stress Concerns    Comments Reviewed patient health questionnaire (PHQ-9) with patient for follow up. Previously, patients score indicated signs/symptoms of depression.  Reviewed to see if patient is improving symptom wise while in program.  Score improved and patient states that it is because she has been able to spend more time with her great gand daughter. Raul is doing well mentally- does not feel she has had any problems. Fortunately, her PHQ went down from 7 to 4 (it was an 11 to begin with)! She has been on Wellbutrin as a new medication.. She is taking all her other medications as prescribed. Her sleep is good, though she states she can't sleep when there is a a full moon. Told patient if there are any changes to let us know. She is frustrated that her cardiologist is saying her SOB is lung related, when her pulmonologist is saying it is cardiac related. She recently had an MRI completed to check her valves which she was told are good. As of now, they want to continue what they're doing- and she wants more answers. She is looking to get a new Pulmonologist to get a 2nd opinion. Jamekia is in the recovery phase from being sick as her son had RSV and she had symptoms from. He was in the hospital with low O2 saturations but has been better and she is relieved. She has been not doing much with low energy but motivated to get to where she was. She had a MRI in which she was told sje had a stroke at one point in the past but otherwise, her heart and valves are working good. Her doctors told her her SOB is mostly from her lung damage. She is still complaining about some chest pressure that she has been experiencing (this is chronic and has been going on for years) and sees her cardiologist on Friday which is where she will talk to them about it again.  She is still taking all of her medcations which feels are working well. She recently went to [place] with her family and grandchildren and had a blast. She is staying busy with the holidays coming up. Katheleen is finally feeling better.  She is set to graduate next week.  She has enjoyed the routine of class and getting back to a regular regimine of exercise.  Her biggest stressor conitnues to be her breathing and swelling in her legs.  Her is also easily frustrated by all the meds she takes and they don't always seem to work.    Expected Outcomes Short: Continue to attend [facility] regularly for regular exercise and social engagement. Long: Continue to improve symptoms and manage a positive mental state. Short: Find new pulmonologist per patient request Long: Continue to utilize exercise for stress management and maintain positive attitude Short: Continue coming to rehab consistently Long: Continue to maintain positive attitude Conitnue to exercise for mental boost and stay positive    Interventions Encouraged to attend Pulmonary Rehabilitation for the exercise Encouraged to attend Pulmonary Rehabilitation for the exercise Encouraged to attend Pulmonary Rehabilitation for the exercise --    Continue Psychosocial Services  Follow up required by staff Follow up required by  staff Follow up required by staff --             Psychosocial Discharge (Final Psychosocial Re-Evaluation):  Psychosocial Re-Evaluation - 08/30/22 1006       Psychosocial Re-Evaluation   Current issues with Current Psychotropic Meds;Current Anxiety/Panic;Current Stress Concerns    Comments Katheleen is finally feeling better.  She is set to graduate next week.  She has enjoyed the routine of class and getting back to a regular regimine of exercise.  Her biggest stressor conitnues to be her breathing and swelling in her legs.  Her is also easily frustrated by all the meds she takes and they don't always seem to work.    Expected  Outcomes Conitnue to exercise for mental boost and stay positive             Education: Education Goals: Education classes will be provided on a weekly basis, covering required topics. Participant will state understanding/return demonstration of topics presented.  Learning Barriers/Preferences:  Learning Barriers/Preferences - 05/15/22 1052       Learning Barriers/Preferences   Learning Barriers Hearing    Learning Preferences None             General Pulmonary Education Topics:  Infection Prevention: - Provides verbal and written material to individual with discussion of infection control including proper hand washing and proper equipment cleaning during exercise session. Flowsheet Row Pulmonary Rehab from 08/23/2022 in [facility] Card[facility] and Pulmonary Rehab  Date 05/15/22  Educator [facility]  Instruction Review Code 1- Verbalizes Understanding       Falls Prevention: - Provides verbal and written material to individual with discussion of falls prevention and safety. Flowsheet Row Pulmonary Rehab from 08/23/2022 in [facility] Cardiac and Pulmonary Rehab  Date 05/15/22  Educator [facility]  Instruction Review Code 1- Verbalizes Understanding       Chronic Lung Disease Review: - Group verbal instruction with posters, models, PowerPoint presentations and videos,  to review new updates, new respiratory medications, new advancements in procedures and treatments. Providing information on websites and "800" numbers for continued self-education. Includes information about supplement oxygen, available portable oxygen systems, continuous and intermittent flow rates, oxygen safety, concentrators, and Medicare reimbursement for oxygen. Explanation of Pulmonary Drugs, including class, frequency, complications, importance of spacers, rinsing mouth after steroid MDI's, and proper cleaning methods for nebulizers. Review of basic lung anatomy and physiology related to function, structure, and complications of  lung disease. Review of risk factors. Discussion about methods for diagnosing sleep apnea and types of masks and machines for OSA. Includes a review of the use of types of environmental controls: home humidity, furnaces, filters, dust mite/pet prevention, HEPA vacuums. Discussion about weather changes, air quality and the benefits of nasal washing. Instruction on Warning signs, infection symptoms, calling MD promptly, preventive modes, and value of vaccinations. Review of effective airway clearance, coughing and/or vibration techniques. Emphasizing that all should Create an Action Plan. Written material given at graduation. Flowsheet Row Pulmonary Rehab from 08/23/2022 in [facility] Cardiac and Pulmonary Rehab  Education need identified 05/23/22  Date 08/23/22  Educator [facility]  Instruction Review Code 1- Verbalizes Understanding       AED/CPR: - Group verbal and written instruction with the use of models to demonstrate the basic use of the AED with the basic ABC's of resuscitation.    Anatomy and Cardiac Procedures: - Group verbal and visual presentation and models provide information about basic cardiac anatomy and function. Reviews the testing methods done to diagnose heart disease and the  outcomes of the test results. Describes the treatment choices: Medical Management, Angioplasty, or Coronary Bypass Surgery for treating various heart conditions including Myocardial Infarction, Angina, Valve Disease, and Cardiac Arrhythmias.  Written material given at graduation.   Medication Safety: - Group verbal and visual instruction to review commonly prescribed medications for heart and lung disease. Reviews the medication, class of the drug, and side effects. Includes the steps to properly store meds and maintain the prescription regimen.  Written material given at graduation. Flowsheet Row Pulmonary Rehab from 08/23/2022 in [facility] Cardiac and Pulmonary Rehab  Date 05/31/22  Educator SB  Instruction Review  Code 1- Verbalizes Understanding       Other: -Provides group and verbal instruction on various topics (see comments)   Knowledge Questionnaire Score:  Knowledge Questionnaire Score - 09/04/22 0941       Knowledge Questionnaire Score   Post Score 17/18              Core Components/Risk Factors/Patient Goals at Admission:  Personal Goals and Risk Factors at Admission - 05/23/22 1459       Core Components/Risk Factors/Patient Goals on Admission    Weight Management Yes;Weight Loss;Obesity    Intervention Weight Management: Develop a combined nutrition and exercise program designed to reach desired caloric intake, while maintaining appropriate intake of nutrient and fiber, sodium and fats, and appropriate energy expenditure required for the weight goal.;Weight Management: Provide education and appropriate resources to help participant work on and attain dietary goals.;Weight Management/Obesity: Establish reasonable short term and long term weight goals.;Obesity: Provide education and appropriate resources to help participant work on and attain dietary goals.    Admit Weight 186 lb 6.4 oz (84.6 kg)    Goal Weight: Short Term 181 lb (82.1 kg)    Goal Weight: Long Term 175 lb (79.4 kg)    Expected Outcomes Short Term: Continue to assess and modify interventions until short term weight is achieved;Long Term: Adherence to nutrition and physical activity/exercise program aimed toward attainment of established weight goal;Weight Loss: Understanding of general recommendations for a balanced deficit meal plan, which promotes 1-2 lb weight loss per week and includes a negative energy balance of [id number] kcal/d;Understanding recommendations for meals to include 15-35% energy as protein, 25-35% energy from fat, 35-60% energy from carbohydrates, less than '200mg'$  of dietary cholesterol, 20-35 gm of total fiber daily;Understanding of distribution of calorie intake throughout the day with the consumption  of 4-5 meals/snacks    Tobacco Cessation Yes   Quit May 2023   Number of packs per day 0    Intervention Assist the participant in steps to quit. Provide individualized education and counseling about committing to Tobacco Cessation, relapse prevention, and pharmacological support that can be provided by physician.;Advice worker, assist with locating and accessing local/national Quit Smoking programs, and support quit date choice.    Expected Outcomes Short Term: Will demonstrate readiness to quit, by selecting a quit date.;Short Term: Will quit all tobacco product use, adhering to prevention of relapse plan.;Long Term: Complete abstinence from all tobacco products for at least 12 months from quit date.    Improve shortness of breath with ADL's Yes    Intervention Provide education, individualized exercise plan and daily activity instruction to help decrease symptoms of SOB with activities of daily living.    Expected Outcomes Short Term: Improve cardiorespiratory fitness to achieve a reduction of symptoms when performing ADLs;Long Term: Be able to perform more ADLs without symptoms or delay the onset of symptoms  Diabetes Yes    Intervention Provide education about signs/symptoms and action to take for hypo/hyperglycemia.;Provide education about proper nutrition, including hydration, and aerobic/resistive exercise prescription along with prescribed medications to achieve blood glucose in normal ranges: Fasting glucose 65-99 mg/dL    Expected Outcomes Short Term: Participant verbalizes understanding of the signs/symptoms and immediate care of hyper/hypoglycemia, proper foot care and importance of medication, aerobic/resistive exercise and nutrition plan for blood glucose control.;Long Term: Attainment of HbA1C < 7%.    Heart Failure Yes    Intervention Provide a combined exercise and nutrition program that is supplemented with education, support and counseling about heart failure.  Directed toward relieving symptoms such as shortness of breath, decreased exercise tolerance, and extremity edema.    Expected Outcomes Improve functional capacity of life;Short term: Attendance in program 2-3 days a week with increased exercise capacity. Reported lower sodium intake. Reported increased fruit and vegetable intake. Reports medication compliance.;Short term: Daily weights obtained and reported for increase. Utilizing diuretic protocols set by physician.;Long term: Adoption of self-care skills and reduction of barriers for early signs and symptoms recognition and intervention leading to self-care maintenance.             Education:Diabetes - Individual verbal and written instruction to review signs/symptoms of diabetes, desired ranges of glucose level fasting, after meals and with exercise. Acknowledge that pre and post exercise glucose checks will be done for 3 sessions at entry of program. Flowsheet Row Pulmonary Rehab from 08/23/2022 in [facility] Cardiac and Pulmonary Rehab  Date 05/15/22  Educator [facility]  Instruction Review Code 1- Verbalizes Understanding       Know Your Numbers and Heart Failure: - Group verbal and visual instruction to discuss disease risk factors for cardiac and pulmonary disease and treatment options.  Reviews associated critical values for Overweight/Obesity, Hypertension, Cholesterol, and Diabetes.  Discusses basics of heart failure: signs/symptoms and treatments.  Introduces Heart Failure Zone chart for action plan for heart failure.  Written material given at graduation. Flowsheet Row Pulmonary Rehab from 08/23/2022 in [facility] Cardiac and Pulmonary Rehab  Date 06/07/22  Educator SB  Instruction Review Code 1- Verbalizes Understanding       Core Components/Risk Factors/Patient Goals Review:   Goals and Risk Factor Review     Row Name 06/16/22 1014 07/17/22 0959 08/14/22 1006 08/30/22 1012       Core Components/Risk Factors/Patient Goals Review    Personal Goals Review Improve shortness of breath with ADL's Improve shortness of breath with ADL's;Tobacco Cessation;Weight Management/Obesity;Diabetes Improve shortness of breath with ADL's;Tobacco Cessation;Weight Management/Obesity;Diabetes Improve shortness of breath with ADL's;Weight Management/Obesity;Diabetes;Increase knowledge of respiratory medications and ability to use respiratory devices properly.    Review Spoke to patient about their shortness of breath and what they can do to improve. Patient has been informed of breathing techniques when starting the program. Patient is informed to tell staff if they have had any med changes and that certain meds they are taking or not taking can be causing shortness of breath. Kory states she has gained weight, and since she has felt a little more SOB. She is talking with the RD on 11/28 to discuss further as she has been trying to lose. She states she is inpatient with the breathing but knows staying consistent will help. She did quit smoking in in May (smoked since she was 68) and says she has felt better since. Her doctor put her on Wellbutrin to help with cravings. She had to stop taking metformin as there was  a  contraindication with one of her other medications. She does take all of her other medications as instructed. She check her sugars at home which  average 130-140 fasting. Doctor is OK with it and tells her to keep a log. Her doctor told her she takes seveal medications that may increase her sugar. Laisha states she has not really lost weight much weight but admits she has not exercise as much because she has been sick. She recently had a MRI completed and her MD concluded that her SOB is mostly from lung damage, and her heart workup was good. She wants to ease back into exercise again slowly and thinks that will help with her breathing. Continues to not smoke. She checks her blood sugar every morning and still ranges 130-140, though she thinks its  side effect from meidcations. She has dicussed this with her doctor. She also recently finished being on prednisone which could've sky rocketed it as well, She is taking all of her medications as prescribed and inhaler as needed. Golden is breathing better again. She is back to her normal.  Her biggest issue continues to be her swelling in her legs as nothing seems to help reduce it.  She continues to check sugars and doing well.  She is doing well on her pulmonary meds though and feel like they do make a difference for her.    Expected Outcomes Short: Attend [facility] regularly to improve shortness of breath with ADL's. Long: maintain independence with ADL's Short: Continue to watch weight and continue working towards weight loss, notify doctor of any changes Long: Continue to maintain lifestyle risk factors Short: Continue to watch blood sugars, notify MD of any major changes Long: Continue to monitor lifestyle risk factors Continue to montior risk factors             Core Components/Risk Factors/Patient Goals at Discharge (Final Review):   Goals and Risk Factor Review - 08/30/22 1012       Core Components/Risk Factors/Patient Goals Review   Personal Goals Review Improve shortness of breath with ADL's;Weight Management/Obesity;Diabetes;Increase knowledge of respiratory medications and ability to use respiratory devices properly.    Review Lexiana is breathing better again. She is back to her normal.  Her biggest issue continues to be her swelling in her legs as nothing seems to help reduce it.  She continues to check sugars and doing well.  She is doing well on her pulmonary meds though and feel like they do make a difference for her.    Expected Outcomes Continue to montior risk factors             ITP Comments:  ITP Comments     Row Name 05/15/22 1050 05/23/22 1413 05/24/22 1009 05/31/22 0800 06/05/22 1410   ITP Comments Virtual Visit completed. Patient informed on EP and RD appointment  and 6 Minute walk test. Patient also informed of patient health questionnaires on My Chart. Patient Verbalizes understanding. Visit diagnosis can be found in [facility] 03/13/22. Completed 6MWT and gym orientation. Initial ITP created and sent for review to Dr. Ottie Glazier, Medical Director. First full day of exercise!  Patient was oriented to gym and equipment including functions, settings, policies, and procedures.  Patient's individual exercise prescription and treatment plan were reviewed.  All starting workloads were established based on the results of the 6 minute walk test done at initial orientation visit.  The plan for exercise progression was also introduced and progression will be customized based on patient's  performance and goals. 30 Day review completed. Medical Director ITP review done, changes made as directed, and signed approval by Medical Director.    New to program Completed Initial RD Consulation    Row Name 06/28/22 1028 07/25/22 1032 07/26/22 0947 08/23/22 1119 09/06/22 1028   ITP Comments 30 Day review completed. Medical Director ITP review done, changes made as directed, and signed approval by Medical Director. Completed nutrition follow-up 30 Day review completed. Medical Director ITP review done, changes made as directed, and signed approval by Medical Director. 30 Day review completed. Medical Director ITP review done, changes made as directed, and signed approval by Medical Director. Rosey graduated today from  rehab with 36 sessions completed.  Details of the patient's exercise prescription and what She needs to do in order to continue the prescription and progress were discussed with patient.  Patient was given a copy of prescription and goals.  Patient verbalized understanding.  Launa plans to continue to exercise by going to [employer].            Comments: Discharge ITP

## 2022-09-07 ENCOUNTER — Other Ambulatory Visit: Payer: Self-pay

## 2022-09-07 ENCOUNTER — Ambulatory Visit: Payer: Medicare Other | Admitting: Internal Medicine

## 2022-09-07 DIAGNOSIS — J449 Chronic obstructive pulmonary disease, unspecified: Secondary | ICD-10-CM

## 2022-09-07 MED ORDER — FLUTICASONE PROPIONATE HFA 110 MCG/ACT IN AERO: 2.0000 | INHALATION_SPRAY | Freq: Two times a day (BID) | RESPIRATORY_TRACT | 3 refills | Status: DC | PRN

## 2022-09-07 MED ORDER — ALBUTEROL SULFATE HFA 108 (90 BASE) MCG/ACT IN AERS: 2.0000 | INHALATION_SPRAY | Freq: Four times a day (QID) | RESPIRATORY_TRACT | 2 refills | Status: DC | PRN

## 2022-09-08 ENCOUNTER — Other Ambulatory Visit: Payer: Self-pay | Admitting: Internal Medicine

## 2022-09-08 DIAGNOSIS — K219 Gastro-esophageal reflux disease without esophagitis: Secondary | ICD-10-CM | POA: Diagnosis not present

## 2022-09-08 DIAGNOSIS — M6289 Other specified disorders of muscle: Secondary | ICD-10-CM | POA: Diagnosis not present

## 2022-09-08 DIAGNOSIS — K582 Mixed irritable bowel syndrome: Secondary | ICD-10-CM | POA: Diagnosis not present

## 2022-09-12 ENCOUNTER — Ambulatory Visit: Payer: Medicare Other | Admitting: Internal Medicine

## 2022-09-12 NOTE — Telephone Encounter (Signed)
Please review her chart she that she need to on all this inhaler due to Flovent is not covered

## 2022-09-21 ENCOUNTER — Encounter: Payer: Self-pay | Admitting: Internal Medicine

## 2022-10-02 ENCOUNTER — Other Ambulatory Visit
Admission: RE | Admit: 2022-10-02 | Discharge: 2022-10-02 | Disposition: A | Discharge status: Home / Self Care | Payer: Medicare Other | Source: Ambulatory Visit | Attending: Internal Medicine | Admitting: Internal Medicine

## 2022-10-02 DIAGNOSIS — I5032 Chronic diastolic (congestive) heart failure: Secondary | ICD-10-CM | POA: Diagnosis not present

## 2022-10-02 LAB — BASIC METABOLIC PANEL
Anion gap: 10 (ref 5–15)
BUN: 22 mg/dL (ref 8–23)
CO2: 29 mmol/L (ref 22–32)
Calcium: 9.8 mg/dL (ref 8.9–10.3)
Chloride: 102 mmol/L (ref 98–111)
Creatinine, Ser: 0.87 mg/dL (ref 0.44–1.00)
GFR, Estimated: >60 mL/min (ref >60)
Glucose, Bld: 119 mg/dL — ABNORMAL HIGH (ref 70–99)
Potassium: 4.1 mmol/L (ref 3.5–5.1)
Sodium: 141 mmol/L (ref 135–145)

## 2022-10-03 ENCOUNTER — Ambulatory Visit (INDEPENDENT_AMBULATORY_CARE_PROVIDER_SITE_OTHER): Payer: Medicare Other | Admitting: Internal Medicine

## 2022-10-03 ENCOUNTER — Encounter: Payer: Self-pay | Admitting: Internal Medicine

## 2022-10-03 ENCOUNTER — Telehealth: Payer: Self-pay | Admitting: Internal Medicine

## 2022-10-03 VITALS — BP 128/65 | HR 68 | Temp 98.3°F | Resp 16 | Ht 66.0 in | Wt 189.0 lb

## 2022-10-03 DIAGNOSIS — N343 Urethral syndrome, unspecified: Secondary | ICD-10-CM | POA: Diagnosis not present

## 2022-10-03 DIAGNOSIS — L578 Other skin changes due to chronic exposure to nonionizing radiation: Secondary | ICD-10-CM | POA: Diagnosis not present

## 2022-10-03 DIAGNOSIS — E1165 Type 2 diabetes mellitus with hyperglycemia: Secondary | ICD-10-CM

## 2022-10-03 DIAGNOSIS — B3731 Acute candidiasis of vulva and vagina: Secondary | ICD-10-CM

## 2022-10-03 DIAGNOSIS — R300 Dysuria: Secondary | ICD-10-CM

## 2022-10-03 DIAGNOSIS — Z86018 Personal history of other benign neoplasm: Secondary | ICD-10-CM | POA: Diagnosis not present

## 2022-10-03 DIAGNOSIS — I5032 Chronic diastolic (congestive) heart failure: Secondary | ICD-10-CM | POA: Diagnosis not present

## 2022-10-03 DIAGNOSIS — Z1231 Encounter for screening mammogram for malignant neoplasm of breast: Secondary | ICD-10-CM

## 2022-10-03 DIAGNOSIS — J9611 Chronic respiratory failure with hypoxia: Secondary | ICD-10-CM

## 2022-10-03 DIAGNOSIS — Z872 Personal history of diseases of the skin and subcutaneous tissue: Secondary | ICD-10-CM | POA: Diagnosis not present

## 2022-10-03 DIAGNOSIS — R319 Hematuria, unspecified: Secondary | ICD-10-CM | POA: Diagnosis not present

## 2022-10-03 DIAGNOSIS — N390 Urinary tract infection, site not specified: Secondary | ICD-10-CM

## 2022-10-03 DIAGNOSIS — L570 Actinic keratosis: Secondary | ICD-10-CM | POA: Diagnosis not present

## 2022-10-03 DIAGNOSIS — D2271 Melanocytic nevi of right lower limb, including hip: Secondary | ICD-10-CM | POA: Diagnosis not present

## 2022-10-03 DIAGNOSIS — D485 Neoplasm of uncertain behavior of skin: Secondary | ICD-10-CM | POA: Diagnosis not present

## 2022-10-03 LAB — POCT URINALYSIS DIPSTICK
Bilirubin, UA: NEGATIVE
Glucose, UA: POSITIVE — AB
Ketones, UA: NEGATIVE
Leukocytes, UA: NEGATIVE
Nitrite, UA: NEGATIVE
Protein, UA: NEGATIVE
Spec Grav, UA: 1.01 (ref 1.010–1.025)
Urobilinogen, UA: 0.2 E.U./dL
pH, UA: 7 (ref 5.0–8.0)

## 2022-10-03 MED ORDER — FLUCONAZOLE 150 MG PO TABS: ORAL_TABLET | ORAL | 1 refills | Status: AC

## 2022-10-03 MED ORDER — POTASSIUM CHLORIDE CRYS ER 20 MEQ PO TBCR: 20.0000 meq | EXTENDED_RELEASE_TABLET | Freq: Two times a day (BID) | ORAL | 3 refills | Status: DC

## 2022-10-03 NOTE — Telephone Encounter (Signed)
Lvm to pt in regards of scheduling appt with Lauren instead of [id number]

## 2022-10-03 NOTE — Progress Notes (Signed)
[facility], [place] [address]  Internal MEDICINE  Office Visit Note  Patient Name: Amber Holder  [id number]  [id number]  Date of Service: 10/05/2022  Chief Complaint  Patient presents with   Follow-up   Diabetes   Gastroesophageal Reflux   Quality Metric Gaps    TDAP   Recurrent UTI    Patient states this morning she noticed a burning sensation during urination as well as some blood on the toilet paper after wiping. Some slight pelvic pressure, localized more towards the left side.    HPI Patient is seen for routine follow-up of her ongoing problems with diabetes and chronic respiratory failure with hypoxia on O2 chronic congestive heart failure as well On previous visit metformin was added to her a.m., fasting blood sugars have improved she seems to be pleased with that however she is complaining of occasional diarrhea COPD is stable on home O2 she continues to cardiopulmonary rehab Complains of vaginal itching and pressure in the pelvic area has history of UTI and fungal vaginitis patient is on Farxiga as well    Current Medication: Outpatient Encounter Medications as of 10/03/2022  Medication Sig   acetaminophen (TYLENOL) 500 MG chewable tablet Chew 1,000 mg by mouth every 8 (eight) hours as needed for pain.   albuterol (VENTOLIN HFA) 108 (90 Base) MCG/ACT inhaler Inhale 2 puffs into the lungs every 6 (six) hours as needed for wheezing or shortness of breath.   aspirin EC 81 MG tablet Take 1 tablet (81 mg total) by mouth daily. Swallow whole.   atorvastatin (LIPITOR) 20 MG tablet Take 1 tablet (20 mg total) by mouth daily.   cholecalciferol (VITAMIN D) 400 UNITS TABS tablet Take 2,000 Units by mouth daily.   CRANBERRY PO Take 25,000 mg by mouth daily.   dapagliflozin propanediol (FARXIGA) 10 MG TABS tablet Take 1 tablet (10 mg total) by mouth daily before breakfast.   fluconazole (DIFLUCAN) 150 MG tablet Take one tab po q week for fungal  infection and as needed   Fluticasone Furoate (ARNUITY ELLIPTA) 100 MCG/ACT AEPB Inhale 1 puff into the lungs daily.   glucose blood (ONETOUCH VERIO) test strip BLOOD SUGAR TESTING THREE TIMES DAILY . DX E11.65   hydrocortisone cream 0.5 % Apply topically as needed.    hydrocortisone cream 0.5 % Apply topically.   ibandronate (BONIVA) 150 MG tablet TAKE 1 TABLET (150 MG TOTAL) BY MOUTH EVERY 30 (THIRTY) DAYS.   Lancets (ONETOUCH DELICA PLUS [id number]) MISC Use  as directed twice a daily DX E11.65   Magnesium 250 MG TABS Take by mouth. Takes 1 tablet 2-3 times per week   metFORMIN (GLUCOPHAGE-XR) 500 MG 24 hr tablet Take 1 tablet (500 mg total) by mouth daily with supper.   montelukast (SINGULAIR) 10 MG tablet Take 10 mg by mouth daily as needed.   Multiple Vitamins-Minerals (MULTIVITAMIN ADULTS PO) Take 1 tablet by mouth daily.   omeprazole (PRILOSEC) 40 MG capsule Take 1 capsule (40 mg total) by mouth daily.   OXYGEN Inhale 3 L into the lungs. Pt uses [facility] for Oxygen   potassium chloride SA (KLOR-CON M) 20 MEQ tablet Take 1 tablet (20 mEq total) by mouth 2 (two) times daily.   Simethicone 80 MG TABS Take 1 tablet (80 mg total) by mouth as needed (burping). As directed.   Tiotropium Bromide-Olodaterol 2.5-2.5 MCG/ACT AERS Inhale 2 puffs into the lungs daily.   torsemide (DEMADEX) 20 MG tablet Take 1 tablet (20  mg total) by mouth daily. May take an extra 20 mg as needed for swelling   [DISCONTINUED] ALPRAZolam (XANAX) 0.25 MG tablet Take one tab po bid for anxiety prn   [DISCONTINUED] amoxicillin-clavulanate (AUGMENTIN) 875-125 MG tablet Take 1 tablet by mouth 2 (two) times daily.   [DISCONTINUED] diphenhydrAMINE (BENADRYL) 50 MG tablet Take one tab 13 hrs, 7 hrs and 1 hr before procedure along with prednisone, may take one extra if experienced itching or hives   [DISCONTINUED] ergocalciferol (DRISDOL) 1.25 MG (50000 UT) capsule Take one cap q week   [DISCONTINUED] fluconazole  (DIFLUCAN) 150 MG tablet Take one tab po q week for fungal infection   [DISCONTINUED] levofloxacin (LEVAQUIN) 500 MG tablet Take 1 tablet (500 mg total) by mouth daily.   [DISCONTINUED] potassium chloride SA (KLOR-CON M) 20 MEQ tablet TAKE 3 TABS (60 MEQ) IN AM AND 2 TABS (40 MEQ) IN THE PM. (Patient taking differently: 40 mEq 2 (two) times daily. TAKE 3 TABS (60 MEQ) IN AM AND 2 TABS (40 MEQ) IN THE PM.)   [DISCONTINUED] predniSONE (DELTASONE) 10 MG tablet Take 1 tab po 3 x day for 3 days then take 1 tab po 2 x a day for 3 days and then take 1 tab po daily for 3 days   No facility-administered encounter medications on file as of 10/03/2022.    Surgical History: Past Surgical History:  Procedure Laterality Date   APPENDECTOMY     cataract surgery Bilateral    CHOLECYSTECTOMY     COLONOSCOPY     COLONOSCOPY WITH PROPOFOL N/A 05/28/2015   Procedure: COLONOSCOPY WITH PROPOFOL;  Surgeon: Lollie Sails, MD;  Location: [facility] ENDOSCOPY;  Service: Endoscopy;  Laterality: N/A;   COLONOSCOPY WITH PROPOFOL N/A 10/28/2018   Procedure: COLONOSCOPY WITH PROPOFOL;  Surgeon: Lollie Sails, MD;  Location: [facility] ENDOSCOPY;  Service: Endoscopy;  Laterality: N/A;   EYE SURGERY Bilateral 2015   Cataract Extraction with IOL   LITHOTRIPSY  2015   has had many stones   Mastoidotomy  1970   ROTATOR CUFF REPAIR Right 2011   TOTAL HIP ARTHROPLASTY Right 06/14/2016   Procedure: TOTAL HIP ARTHROPLASTY;  Surgeon: Dereck Leep, MD;  Location: [facility] ORS;  Service: Orthopedics;  Laterality: Right;    Medical History: Past Medical History:  Diagnosis Date   Anxiety 01/02/2012   Arthritis    Asthma    Blockage of coronary artery of heart (HCC)    Chronic kidney disease    Kidney stones   Colon polyps    COPD (chronic obstructive pulmonary disease) (HCC)    Diabetes mellitus without complication (HCC)    GERD (gastroesophageal reflux disease)    HOH (hard of hearing)    Bilateral hearing aids   Kidney  stone    Osteoporosis    Sleep apnea    Does not use C-PAP on a regular basis   Venous stasis     Family History: Family History  Problem Relation Age of Onset   Breast cancer Maternal Aunt 43   Bladder Cancer Neg Hx    Kidney cancer Neg Hx     Social History   Socioeconomic History   Marital status: Divorced    Spouse name: Not on file   Number of children: Not on file   Years of education: Not on file   Highest education level: Not on file  Occupational History   Not on file  Tobacco Use   Smoking status: Former    Packs/day:  1.00    Years: 50.00    Total pack years: 50.00    Types: Cigarettes    Quit date: 12/28/2021    Years since quitting: 0.7    Passive exposure: Never   Smokeless tobacco: Never   Tobacco comments:    1 pack daily//quit 12/28/2021  Vaping Use   Vaping Use: Never used  Substance and Sexual Activity   Alcohol use: Not Currently    Comment: very rarely    Drug use: No   Sexual activity: Not Currently  Other Topics Concern   Not on file  Social History Narrative   Not on file   Social Determinants of Health   Financial Resource Strain: Not on file  Food Insecurity: Not on file  Transportation Needs: Not on file  Physical Activity: Insufficiently Active (09/21/2022)   Exercise Vital Sign    Days of Exercise per Week: 3 days    Minutes of Exercise per Session: 30 min  Stress: Not on file  Social Connections: Not on file  Intimate Partner Violence: Not on file   Results for orders placed or performed in visit on 07/27/22  CULTURE, URINE COMPREHENSIVE     Status: None   Collection Time: 07/27/22  4:15 PM   Specimen: Urine   Urine  Result Value Ref Range Status   Urine Culture, Comprehensive Final report  Final   Organism ID, Bacteria Comment  Final    Comment: Mixed urogenital flora 10,000-25,000 colony forming units per mL    1 Result Note     1 HM Topic       Component Ref Range & Units 7 mo ago 3 yr ago 4 yr ago   Creatinine, Urine Not Estab. mg/dL 78.4    Microalbumin, Urine Not Estab. ug/mL 42.0 5.7 3.7  Microalb/Creat Ratio 0 - 29 mg/g creat 54 High     Comment:                        Normal:                0 -  29                        Moderately increased: 30 - 300                        Severely increased:       >300       Review of Systems  Constitutional:  Negative for fatigue and fever.  HENT:  Negative for congestion, mouth sores and postnasal drip.   Respiratory:  Negative for cough.   Cardiovascular:  Negative for chest pain.  Genitourinary:  Positive for dysuria and urgency. Negative for flank pain.  Psychiatric/Behavioral: Negative.      Vital Signs: BP 128/65   Pulse 68   Temp 98.3 F (36.8 C)   Resp 16   Ht '5\' 6"'$  (1.676 m)   Wt 189 lb (85.7 kg)   SpO2 92%   BMI 30.51 kg/m    Physical Exam Constitutional:      Appearance: Normal appearance.  HENT:     Head: Normocephalic and atraumatic.     Nose: Nose normal.     Mouth/Throat:     Mouth: Mucous membranes are moist.     Pharynx: No posterior oropharyngeal erythema.  Eyes:     Extraocular Movements: Extraocular movements intact.  Pupils: Pupils are equal, round, and reactive to light.  Cardiovascular:     Pulses: Normal pulses.     Heart sounds: Normal heart sounds.  Pulmonary:     Effort: Pulmonary effort is normal.     Breath sounds: Normal breath sounds.  Neurological:     General: No focal deficit present.     Mental Status: She is alert.  Psychiatric:        Mood and Affect: Mood normal.        Behavior: Behavior normal.        Assessment/Plan: 1. Candida vaginitis Will go ahead and give her prescription of fluconazole for prevention of vaginitis - fluconazole (DIFLUCAN) 150 MG tablet; Take one tab po q week for fungal infection and as needed  Dispense: 12 tablet; Refill: 1  2. Dysuria-frequency syndrome Urine is taking for cultures and sensitivities - POCT Urinalysis Dipstick -  CULTURE, URINE COMPREHENSIVE  3. Visit for screening mammogram Mammogram is ordered  4. Chronic respiratory failure with hypoxia (HCC) Continue with cardiopulmonary rehabilitation, encouraged her to limit her calories and increase protein intake to the lose some weight, continue on oxygen - CMP14+EGFR  5. Poorly controlled type 2 diabetes mellitus ([place]) Patient is complaining of diarrhea is supposed to see GI however in the meantime she was instructed to add Metamucil, questionable side effect of metformin will continue on Farxiga as before - CMP14+EGFR  6. Chronic diastolic congestive heart failure ([place]) Patient is only day cleaning diuretics and potassium as needed - potassium chloride SA (KLOR-CON M) 20 MEQ tablet; Take 1 tablet (20 mEq total) by mouth 2 (two) times daily.  Dispense: 180 tablet; Refill: 3   General Counseling: Benedetta verbalizes understanding of the findings of todays visit and agrees with plan of treatment. I have discussed any further diagnostic evaluation that may be needed or ordered today. We also reviewed her medications today. she has been encouraged to call the office with any questions or concerns that should arise related to todays visit.    Orders Placed This Encounter  Procedures   CULTURE, URINE COMPREHENSIVE   MM 3D SCREEN BREAST BILATERAL   CMP14+EGFR   POCT Urinalysis Dipstick    Meds ordered this encounter  Medications   potassium chloride SA (KLOR-CON M) 20 MEQ tablet    Sig: Take 1 tablet (20 mEq total) by mouth 2 (two) times daily.    Dispense:  180 tablet    Refill:  3   fluconazole (DIFLUCAN) 150 MG tablet    Sig: Take one tab po q week for fungal infection and as needed    Dispense:  12 tablet    Refill:  1    Total time spent:45 Minutes Time spent includes review of chart, medications, test results, and follow up plan with the patient.   [place] Controlled Substance Database was reviewed by me.   Dr Lavera Guise Internal medicine

## 2022-10-05 ENCOUNTER — Telehealth: Payer: Self-pay | Admitting: Nurse Practitioner

## 2022-10-05 NOTE — Telephone Encounter (Signed)
Notified patient of mammogram appointment date, time and location-[place]

## 2022-10-08 NOTE — Progress Notes (Signed)
Cultures are pending

## 2022-10-10 ENCOUNTER — Telehealth: Payer: Self-pay

## 2022-10-10 NOTE — Telephone Encounter (Signed)
Spoke with patient and let her know that urine culture results are still pending.

## 2022-10-10 NOTE — Telephone Encounter (Signed)
-----   Message from Lavera Guise, MD sent at 10/08/2022  7:11 PM EST ----- Cultures are pending

## 2022-10-11 LAB — CULTURE, URINE COMPREHENSIVE

## 2022-10-11 NOTE — Progress Notes (Signed)
Please send in RX for Macrobid 100 mg po bid x 10 days #20

## 2022-10-12 ENCOUNTER — Other Ambulatory Visit: Payer: Self-pay

## 2022-10-12 MED ORDER — NITROFURANTOIN MONOHYD MACRO 100 MG PO CAPS: 100.0000 mg | ORAL_CAPSULE | Freq: Two times a day (BID) | ORAL | 0 refills | Status: DC

## 2022-10-12 NOTE — Telephone Encounter (Signed)
Sent Macrobid 147m for patient for UTI per [facility]. Left message for patient to let her know medication was sent for her.

## 2022-10-24 ENCOUNTER — Ambulatory Visit
Admission: RE | Admit: 2022-10-24 | Discharge: 2022-10-24 | Disposition: A | Discharge status: Home / Self Care | Payer: Medicare Other | Source: Ambulatory Visit | Attending: Internal Medicine | Admitting: Internal Medicine

## 2022-10-24 DIAGNOSIS — Z1231 Encounter for screening mammogram for malignant neoplasm of breast: Secondary | ICD-10-CM | POA: Diagnosis not present

## 2022-10-26 ENCOUNTER — Other Ambulatory Visit: Payer: Self-pay | Admitting: Internal Medicine

## 2022-10-26 DIAGNOSIS — N63 Unspecified lump in unspecified breast: Secondary | ICD-10-CM

## 2022-10-26 DIAGNOSIS — R928 Other abnormal and inconclusive findings on diagnostic imaging of breast: Secondary | ICD-10-CM

## 2022-10-31 DIAGNOSIS — Z961 Presence of intraocular lens: Secondary | ICD-10-CM | POA: Diagnosis not present

## 2022-10-31 DIAGNOSIS — E119 Type 2 diabetes mellitus without complications: Secondary | ICD-10-CM | POA: Diagnosis not present

## 2022-11-01 ENCOUNTER — Telehealth: Payer: Self-pay | Admitting: Internal Medicine

## 2022-11-01 NOTE — Telephone Encounter (Signed)
Paper mammogram orders signed & faxed to [facility]; [phone]

## 2022-11-03 ENCOUNTER — Other Ambulatory Visit: Payer: Self-pay | Admitting: Nurse Practitioner

## 2022-11-03 ENCOUNTER — Telehealth: Payer: Self-pay | Admitting: Internal Medicine

## 2022-11-03 ENCOUNTER — Other Ambulatory Visit: Payer: Self-pay | Admitting: Student in an Organized Health Care Education/Training Program

## 2022-11-03 DIAGNOSIS — N63 Unspecified lump in unspecified breast: Secondary | ICD-10-CM

## 2022-11-03 DIAGNOSIS — R928 Other abnormal and inconclusive findings on diagnostic imaging of breast: Secondary | ICD-10-CM

## 2022-11-03 NOTE — Telephone Encounter (Signed)
[place] form signed. Faxed back; [phone]. To be scanned-nm

## 2022-11-03 NOTE — Telephone Encounter (Signed)
Received [facility] form. Gave to AA for signature-nm

## 2022-11-07 DIAGNOSIS — H6123 Impacted cerumen, bilateral: Secondary | ICD-10-CM | POA: Diagnosis not present

## 2022-11-07 DIAGNOSIS — H903 Sensorineural hearing loss, bilateral: Secondary | ICD-10-CM | POA: Diagnosis not present

## 2022-11-09 ENCOUNTER — Telehealth: Payer: Self-pay | Admitting: Internal Medicine

## 2022-11-09 NOTE — Telephone Encounter (Signed)
2 previous office notes and labs faxed to [facility]; [phone]

## 2022-11-10 ENCOUNTER — Ambulatory Visit
Admission: RE | Admit: 2022-11-10 | Discharge: 2022-11-10 | Disposition: A | Discharge status: Home / Self Care | Payer: Medicare Other | Source: Ambulatory Visit | Attending: Nurse Practitioner | Admitting: Nurse Practitioner

## 2022-11-10 DIAGNOSIS — R928 Other abnormal and inconclusive findings on diagnostic imaging of breast: Secondary | ICD-10-CM | POA: Diagnosis not present

## 2022-11-10 DIAGNOSIS — R92332 Mammographic heterogeneous density, left breast: Secondary | ICD-10-CM | POA: Diagnosis not present

## 2022-11-10 DIAGNOSIS — N63 Unspecified lump in unspecified breast: Secondary | ICD-10-CM

## 2022-11-10 DIAGNOSIS — R922 Inconclusive mammogram: Secondary | ICD-10-CM | POA: Diagnosis not present

## 2022-11-10 DIAGNOSIS — N6002 Solitary cyst of left breast: Secondary | ICD-10-CM | POA: Diagnosis not present

## 2022-11-16 ENCOUNTER — Telehealth: Payer: Self-pay

## 2022-11-16 ENCOUNTER — Other Ambulatory Visit: Payer: Self-pay

## 2022-11-16 DIAGNOSIS — J4489 Other specified chronic obstructive pulmonary disease: Secondary | ICD-10-CM

## 2022-11-16 MED ORDER — TIOTROPIUM BROMIDE-OLODATEROL 2.5-2.5 MCG/ACT IN AERS: 2.0000 | INHALATION_SPRAY | Freq: Every day | RESPIRATORY_TRACT | 4 refills | Status: DC

## 2022-11-16 NOTE — Telephone Encounter (Signed)
Lmom to call us back 

## 2022-11-17 ENCOUNTER — Encounter: Payer: Self-pay | Admitting: Cardiology

## 2022-11-17 ENCOUNTER — Ambulatory Visit: Payer: Medicare Other | Attending: Cardiology | Admitting: Cardiology

## 2022-11-17 VITALS — BP 110/60 | HR 59 | Ht 66.0 in | Wt 189.4 lb

## 2022-11-17 DIAGNOSIS — E7800 Pure hypercholesterolemia, unspecified: Secondary | ICD-10-CM | POA: Diagnosis not present

## 2022-11-17 DIAGNOSIS — R600 Localized edema: Secondary | ICD-10-CM | POA: Diagnosis not present

## 2022-11-17 DIAGNOSIS — I2510 Atherosclerotic heart disease of native coronary artery without angina pectoris: Secondary | ICD-10-CM

## 2022-11-17 NOTE — Progress Notes (Signed)
Cardiology Office Note:    Date:  11/17/2022   ID:  Amber Holder, DOB 12/04/1945, MRN [id number]  PCP:  Lavera Guise, MD   [facility] Providers Cardiologist:  Kate Sable, MD     Referring MD: Lavera Guise, MD   Chief Complaint  Patient presents with   Follow-up    3 month f/u, no new cardiac concerns     History of Present Illness:    Amber Holder is a 77 y.o. female with a hx of CAD (CTO RCA mild to moderate LCx and LAD dz), hyperlipidemia, diabetes, COPD, former smoker x40+ years, PAD/carotid stenosis, IBS who presents for follow-up.    Last seen for bilateral leg edema, torsemide was started with some improvement.  Did not take torsemide this morning, trying to avoid frequent bathroom runs.  Otherwise doing okay, has frequent diarrhea attributable to irritable bowel syndrome.  Follows up with Endo/IBS specialist.  Prior notes CMR EF 58%, subendocardial scar comprising 75% wall thickness in the LV inferior wall, inferior wall nonfriable. Coronary CTA 10/2021 calcium score 790, CTO proximal RCA, nonobstructive LAD and left circumflex disease Echo 10/2021 EF 60 to 65%, impaired laxation. Did not tolerate higher doses of Lipitor, currently on 20 mg daily.  Outside echocardiogram 2021 showed normal systolic function, diastolic dysfunction EF 123456 Chest CT 06/2016 showed coronary artery calcification  Past Medical History:  Diagnosis Date   Anxiety 01/02/2012   Arthritis    Asthma    Blockage of coronary artery of heart (HCC)    Chronic kidney disease    Kidney stones   Colon polyps    COPD (chronic obstructive pulmonary disease) (HCC)    Diabetes mellitus without complication (HCC)    GERD (gastroesophageal reflux disease)    HOH (hard of hearing)    Bilateral hearing aids   Kidney stone    Osteoporosis    Sleep apnea    Does not use C-PAP on a regular basis   Venous stasis     Past Surgical History:  Procedure Laterality Date   APPENDECTOMY      cataract surgery Bilateral    CHOLECYSTECTOMY     COLONOSCOPY     COLONOSCOPY WITH PROPOFOL N/A 05/28/2015   Procedure: COLONOSCOPY WITH PROPOFOL;  Surgeon: Lollie Sails, MD;  Location: [facility] ENDOSCOPY;  Service: Endoscopy;  Laterality: N/A;   COLONOSCOPY WITH PROPOFOL N/A 10/28/2018   Procedure: COLONOSCOPY WITH PROPOFOL;  Surgeon: Lollie Sails, MD;  Location: [facility] ENDOSCOPY;  Service: Endoscopy;  Laterality: N/A;   EYE SURGERY Bilateral 2015   Cataract Extraction with IOL   LITHOTRIPSY  2015   has had many stones   Mastoidotomy  1970   ROTATOR CUFF REPAIR Right 2011   TOTAL HIP ARTHROPLASTY Right 06/14/2016   Procedure: TOTAL HIP ARTHROPLASTY;  Surgeon: Dereck Leep, MD;  Location: [facility] ORS;  Service: Orthopedics;  Laterality: Right;    Current Medications: Current Meds  Medication Sig   acetaminophen (TYLENOL) 500 MG chewable tablet Chew 1,000 mg by mouth every 8 (eight) hours as needed for pain.   albuterol (VENTOLIN HFA) 108 (90 Base) MCG/ACT inhaler Inhale 2 puffs into the lungs every 6 (six) hours as needed for wheezing or shortness of breath.   aspirin EC 81 MG tablet Take 1 tablet (81 mg total) by mouth daily. Swallow whole.   atorvastatin (LIPITOR) 20 MG tablet Take 1 tablet (20 mg total) by mouth daily.   cholecalciferol (VITAMIN D) 400 UNITS TABS tablet Take 2,000  Units by mouth daily.   CRANBERRY PO Take 25,000 mg by mouth daily.   dapagliflozin propanediol (FARXIGA) 10 MG TABS tablet Take 1 tablet (10 mg total) by mouth daily before breakfast.   fluconazole (DIFLUCAN) 150 MG tablet Take one tab po q week for fungal infection and as needed   Fluticasone Furoate (ARNUITY ELLIPTA) 100 MCG/ACT AEPB Inhale 1 puff into the lungs daily.   glucose blood (ONETOUCH VERIO) test strip BLOOD SUGAR TESTING THREE TIMES DAILY . DX E11.65   hydrocortisone cream 0.5 % Apply topically as needed.    hydrocortisone cream 0.5 % Apply topically.   ibandronate (BONIVA) 150 MG tablet  TAKE 1 TABLET (150 MG TOTAL) BY MOUTH EVERY 30 (THIRTY) DAYS.   Lancets (ONETOUCH DELICA PLUS Q000111Q) MISC Use  as directed twice a daily DX E11.65   Magnesium 250 MG TABS Take by mouth. Takes 1 tablet 2-3 times per week   metFORMIN (GLUCOPHAGE-XR) 500 MG 24 hr tablet Take 1 tablet (500 mg total) by mouth daily with supper.   montelukast (SINGULAIR) 10 MG tablet Take 10 mg by mouth daily as needed.   Multiple Vitamins-Minerals (MULTIVITAMIN ADULTS PO) Take 1 tablet by mouth daily.   nitrofurantoin, macrocrystal-monohydrate, (MACROBID) 100 MG capsule Take 1 capsule (100 mg total) by mouth 2 (two) times daily.   omeprazole (PRILOSEC) 40 MG capsule Take 1 capsule (40 mg total) by mouth daily.   OXYGEN Inhale 3 L into the lungs. Pt uses [facility] for Oxygen   potassium chloride SA (KLOR-CON M) 20 MEQ tablet Take 1 tablet (20 mEq total) by mouth 2 (two) times daily.   Simethicone 80 MG TABS Take 1 tablet (80 mg total) by mouth as needed (burping). As directed.   Tiotropium Bromide-Olodaterol 2.5-2.5 MCG/ACT AERS Inhale 2 puffs into the lungs daily.   torsemide (DEMADEX) 20 MG tablet Take 1 tablet (20 mg total) by mouth daily. May take an extra 20 mg as needed for swelling     Allergies:   Iodinated contrast media, Shellfish allergy, Sulfa antibiotics, Sulfasalazine, Percocet [oxycodone-acetaminophen], and Iodine   Social History   Socioeconomic History   Marital status: Divorced    Spouse name: Not on file   Number of children: Not on file   Years of education: Not on file   Highest education level: Not on file  Occupational History   Not on file  Tobacco Use   Smoking status: Former    Packs/day: 1.00    Years: 50.00    Additional pack years: 0.00    Total pack years: 50.00    Types: Cigarettes    Quit date: 12/28/2021    Years since quitting: 0.8    Passive exposure: Never   Smokeless tobacco: Never   Tobacco comments:    1 pack daily//quit 12/28/2021  Vaping Use    Vaping Use: Never used  Substance and Sexual Activity   Alcohol use: Not Currently    Comment: very rarely    Drug use: No   Sexual activity: Not Currently  Other Topics Concern   Not on file  Social History Narrative   Not on file   Social Determinants of Health   Financial Resource Strain: Not on file  Food Insecurity: Not on file  Transportation Needs: Not on file  Physical Activity: Insufficiently Active (09/21/2022)   Exercise Vital Sign    Days of Exercise per Week: 3 days    Minutes of Exercise per Session: 30 min  Stress: Not  on file  Social Connections: Not on file     Family History: The patient's family history includes Breast cancer in her maternal aunt; Breast cancer (age of onset: 100) in her maternal aunt. There is no history of Bladder Cancer or Kidney cancer.  ROS:   Please see the history of present illness.     All other systems reviewed and are negative.  EKGs/Labs/Other Studies Reviewed:    The following studies were reviewed today:   EKG:  EKG is  ordered today.  EKG shows sinus bradycardia, heart rate 59, otherwise normal ECG.  Recent Labs: 05/19/2022: ALT 20; Platelets 237; TSH 1.527 06/12/2022: Hemoglobin 14.9 10/02/2022: BUN 22; Creatinine, Ser 0.87; Potassium 4.1; Sodium 141  Recent Lipid Panel    Component Value Date/Time   CHOL 172 07/07/2022 1112   CHOL 239 (H) 12/24/2020 1154   TRIG 104 07/07/2022 1112   HDL 45 07/07/2022 1112   HDL 55 12/24/2020 1154   CHOLHDL 3.8 07/07/2022 1112   VLDL 21 07/07/2022 1112   LDLCALC 106 (H) 07/07/2022 1112   LDLCALC 168 (H) 12/24/2020 1154     Risk Assessment/Calculations:          Physical Exam:    VS:  BP 110/60 (BP Location: Left Arm, Patient Position: Sitting, Cuff Size: Normal)   Pulse (!) 59   Ht 5\' 6"  (1.676 m)   Wt 189 lb 6.4 oz (85.9 kg)   SpO2 90% Comment: 2 liters  BMI 30.57 kg/m     Wt Readings from Last 3 Encounters:  11/17/22 189 lb 6.4 oz (85.9 kg)  10/03/22 189 lb  (85.7 kg)  09/05/22 194 lb 3.2 oz (88.1 kg)     GEN:  Well nourished, well developed in no acute distress HEENT: Normal NECK: No JVD; No carotid bruits CARDIAC: RRR, no murmurs, rubs, gallops RESPIRATORY: Decreased breath sounds, no wheezing. ABDOMEN: Soft, non-tender, non-distended MUSCULOSKELETAL:  1+ nonpitting edema. SKIN: Warm and dry NEUROLOGIC:  Alert and oriented x 3 PSYCHIATRIC:  Normal affect   ASSESSMENT:    1. Coronary artery disease involving native coronary artery of native heart, unspecified whether angina present   2. Pure hypercholesterolemia   3. Leg edema    PLAN:    In order of problems listed above:  CAD, CTO RCA.  Calcium score 790.  CMR 10/23 showed inferior LV wall nonviable.  EF 58%.  Denies chest pain.  Continue aspirin 81 mg daily, Zetia, Lipitor 20.  Echo EF 60 to 65%, impaired relaxation.  No plans for left heart cath, as patient has CTO, patient also has iodine allergies. Hyperlipidemia, Zetia 10 mg daily.  Lipitor 20 mg daily.  Not tolerant to higher dose of Lipitor.  Leg edema bilaterally, continue torsemide 20 mg daily.  Extra torsemide dose as needed.  Follow-up in 12 months  Medication Adjustments/Labs and Tests Ordered: Current medicines are reviewed at length with the patient today.  Concerns regarding medicines are outlined above.  Orders Placed This Encounter  Procedures   EKG 12-Lead   No orders of the defined types were placed in this encounter.   Patient Instructions  Medication Instructions:   Your physician recommends that you continue on your current medications as directed. Please refer to the Current Medication list given to you today.  *If you need a refill on your cardiac medications before your next appointment, please call your pharmacy*   Lab Work:  None Ordered  If you have labs (blood work) drawn today and your  tests are completely normal, you will receive your results only by: MyChart Message (if you have  MyChart) OR A paper copy in the mail If you have any lab test that is abnormal or we need to change your treatment, we will call you to review the results.   Testing/Procedures:  None Ordered   Follow-Up: At [facility], you and your health needs are our priority.  As part of our continuing mission to provide you with exceptional heart care, we have created designated Provider Care Teams.  These Care Teams include your primary Cardiologist (physician) and Advanced Practice Providers (APPs -  Physician Assistants and Nurse Practitioners) who all work together to provide you with the care you need, when you need it.  We recommend signing up for the patient portal called "MyChart".  Sign up information is provided on this After Visit Summary.  MyChart is used to connect with patients for Virtual Visits (Telemedicine).  Patients are able to view lab/test results, encounter notes, upcoming appointments, etc.  Non-urgent messages can be sent to your provider as well.   To learn more about what you can do with MyChart, go to [email or web address].    Your next appointment:   12 month(s)  Provider:   You may see Kate Sable, MD or one of the following Advanced Practice Providers on your designated Care Team:   Murray Hodgkins, NP Christell Faith, PA-C Cadence Kathlen Mody, PA-C Gerrie Nordmann, NP   Signed, Kate Sable, MD  11/17/2022 10:35 AM    [place]

## 2022-11-17 NOTE — Patient Instructions (Signed)
Medication Instructions:   Your physician recommends that you continue on your current medications as directed. Please refer to the Current Medication list given to you today.  *If you need a refill on your cardiac medications before your next appointment, please call your pharmacy*   Lab Work:  None Ordered  If you have labs (blood work) drawn today and your tests are completely normal, you will receive your results only by: MyChart Message (if you have MyChart) OR A paper copy in the mail If you have any lab test that is abnormal or we need to change your treatment, we will call you to review the results.   Testing/Procedures:  None Ordered    Follow-Up: At [facility], you and your health needs are our priority.  As part of our continuing mission to provide you with exceptional heart care, we have created designated Provider Care Teams.  These Care Teams include your primary Cardiologist (physician) and Advanced Practice Providers (APPs -  Physician Assistants and Nurse Practitioners) who all work together to provide you with the care you need, when you need it.  We recommend signing up for the patient portal called "MyChart".  Sign up information is provided on this After Visit Summary.  MyChart is used to connect with patients for Virtual Visits (Telemedicine).  Patients are able to view lab/test results, encounter notes, upcoming appointments, etc.  Non-urgent messages can be sent to your provider as well.   To learn more about what you can do with MyChart, go to [email or web address].    Your next appointment:   12 month(s)  Provider:   You may see Brian Agbor-Etang, MD or one of the following Advanced Practice Providers on your designated Care Team:   Christopher Berge, NP Ryan Dunn, PA-C Cadence Furth, PA-C Sheri Hammock, NP  

## 2022-11-20 ENCOUNTER — Encounter: Payer: Self-pay | Admitting: Internal Medicine

## 2022-11-21 ENCOUNTER — Telehealth: Payer: Self-pay | Admitting: Internal Medicine

## 2022-11-21 NOTE — Telephone Encounter (Signed)
[employer] order signed. Faxed back; [phone]. To be scanned-nm

## 2022-12-04 ENCOUNTER — Ambulatory Visit: Payer: Medicare Other | Admitting: Physician Assistant

## 2022-12-08 ENCOUNTER — Other Ambulatory Visit
Admission: RE | Admit: 2022-12-08 | Discharge: 2022-12-08 | Disposition: A | Discharge status: Home / Self Care | Payer: Medicare Other | Attending: Internal Medicine | Admitting: Internal Medicine

## 2022-12-08 DIAGNOSIS — N390 Urinary tract infection, site not specified: Secondary | ICD-10-CM | POA: Diagnosis not present

## 2022-12-08 DIAGNOSIS — R319 Hematuria, unspecified: Secondary | ICD-10-CM | POA: Insufficient documentation

## 2022-12-08 DIAGNOSIS — J9611 Chronic respiratory failure with hypoxia: Secondary | ICD-10-CM | POA: Diagnosis not present

## 2022-12-08 LAB — COMPREHENSIVE METABOLIC PANEL
ALT: 18 U/L (ref 0–44)
AST: 19 U/L (ref 15–41)
Albumin: 4.1 g/dL (ref 3.5–5.0)
Alkaline Phosphatase: 60 U/L (ref 38–126)
Anion gap: 10 (ref 5–15)
BUN: 19 mg/dL (ref 8–23)
CO2: 25 mmol/L (ref 22–32)
Calcium: 9.6 mg/dL (ref 8.9–10.3)
Chloride: 103 mmol/L (ref 98–111)
Creatinine, Ser: 0.81 mg/dL (ref 0.44–1.00)
GFR, Estimated: >60 mL/min (ref >60)
Glucose, Bld: 102 mg/dL — ABNORMAL HIGH (ref 70–99)
Potassium: 4.3 mmol/L (ref 3.5–5.1)
Sodium: 138 mmol/L (ref 135–145)
Total Bilirubin: 0.8 mg/dL (ref 0.3–1.2)
Total Protein: 7.3 g/dL (ref 6.5–8.1)

## 2022-12-12 ENCOUNTER — Ambulatory Visit (INDEPENDENT_AMBULATORY_CARE_PROVIDER_SITE_OTHER): Payer: Medicare Other | Admitting: Internal Medicine

## 2022-12-12 ENCOUNTER — Encounter: Payer: Self-pay | Admitting: Internal Medicine

## 2022-12-12 VITALS — BP 114/68 | HR 64 | Temp 98.3°F | Resp 16 | Ht 66.0 in | Wt 185.6 lb

## 2022-12-12 DIAGNOSIS — J418 Mixed simple and mucopurulent chronic bronchitis: Secondary | ICD-10-CM | POA: Diagnosis not present

## 2022-12-12 DIAGNOSIS — R300 Dysuria: Secondary | ICD-10-CM

## 2022-12-12 DIAGNOSIS — N390 Urinary tract infection, site not specified: Secondary | ICD-10-CM

## 2022-12-12 DIAGNOSIS — E1165 Type 2 diabetes mellitus with hyperglycemia: Secondary | ICD-10-CM

## 2022-12-12 DIAGNOSIS — F320 Major depressive disorder, single episode, mild: Secondary | ICD-10-CM

## 2022-12-12 DIAGNOSIS — J9611 Chronic respiratory failure with hypoxia: Secondary | ICD-10-CM | POA: Diagnosis not present

## 2022-12-12 DIAGNOSIS — R0602 Shortness of breath: Secondary | ICD-10-CM

## 2022-12-12 LAB — POCT URINALYSIS DIPSTICK
Bilirubin, UA: NEGATIVE
Glucose, UA: POSITIVE — AB
Ketones, UA: POSITIVE
Nitrite, UA: NEGATIVE
Protein, UA: NEGATIVE
Spec Grav, UA: 1.01 (ref 1.010–1.025)
Urobilinogen, UA: 0.2 E.U./dL
pH, UA: 5 (ref 5.0–8.0)

## 2022-12-12 MED ORDER — MONTELUKAST SODIUM 10 MG PO TABS: 10.0000 mg | ORAL_TABLET | Freq: Every day | ORAL | 1 refills | Status: AC | PRN

## 2022-12-12 MED ORDER — IPRATROPIUM-ALBUTEROL 0.5-2.5 (3) MG/3ML IN SOLN
3.0000 mL | Freq: Once | RESPIRATORY_TRACT | Status: AC
  Administered 2022-12-12: 3 mL via RESPIRATORY_TRACT

## 2022-12-12 MED ORDER — BUPROPION HCL ER (XL) 150 MG PO TB24: 150.0000 mg | ORAL_TABLET | Freq: Every day | ORAL | 3 refills | Status: DC

## 2022-12-12 MED ORDER — METFORMIN HCL ER 500 MG PO TB24: 500.0000 mg | ORAL_TABLET | Freq: Every day | ORAL | 1 refills | Status: DC

## 2022-12-12 MED ORDER — OMEPRAZOLE 40 MG PO CPDR: 40.0000 mg | DELAYED_RELEASE_CAPSULE | Freq: Every day | ORAL | 3 refills | Status: DC

## 2022-12-12 NOTE — Progress Notes (Signed)
[facility] [address] [place], [place] [address]  Internal MEDICINE  Office Visit Note  Patient Name: Amber Holder  [id number]  [id number]  Date of Service: 12/18/2022  Chief Complaint  Patient presents with   Follow-up   Diabetes   Gastroesophageal Reflux   Cough    Patient having a cough for 2 days, possibly allergies. Some sore throat. Patient states she has been walking outdoors.   Recurrent UTI    HPI Pt is here for routine follow up Needs refill on metformin  Not happy with cost pf her inhalers, will like Trelegy again  Needs letter for travel on cruise ship for May Had cyst in her left breast  Potassium is 4.3 today  Overall she thinks she should be feeling better, ?? GAD, was on Wellbutrin before  C/o dysuria symptoms, has chronic UTI     Current Medication: Outpatient Encounter Medications as of 12/12/2022  Medication Sig   acetaminophen (TYLENOL) 500 MG chewable tablet Chew 1,000 mg by mouth every 8 (eight) hours as needed for pain.   albuterol (VENTOLIN HFA) 108 (90 Base) MCG/ACT inhaler Inhale 2 puffs into the lungs every 6 (six) hours as needed for wheezing or shortness of breath.   aspirin EC 81 MG tablet Take 1 tablet (81 mg total) by mouth daily. Swallow whole.   atorvastatin (LIPITOR) 20 MG tablet Take 1 tablet (20 mg total) by mouth daily.   buPROPion (WELLBUTRIN XL) 150 MG 24 hr tablet Take 1 tablet (150 mg total) by mouth daily.   cholecalciferol (VITAMIN D) 400 UNITS TABS tablet Take 2,000 Units by mouth daily.   CRANBERRY PO Take 25,000 mg by mouth daily.   dapagliflozin propanediol (FARXIGA) 10 MG TABS tablet Take 1 tablet (10 mg total) by mouth daily before breakfast.   fluconazole (DIFLUCAN) 150 MG tablet Take one tab po q week for fungal infection and as needed   Fluticasone-Umeclidin-Vilant (TRELEGY ELLIPTA) 100-62.5-25 MCG/ACT AEPB Inhale 1 puff into the lungs daily.   glucose blood (ONETOUCH VERIO) test strip BLOOD SUGAR TESTING  THREE TIMES DAILY . DX E11.65   hydrocortisone cream 0.5 % Apply topically as needed.    hydrocortisone cream 0.5 % Apply topically.   ibandronate (BONIVA) 150 MG tablet TAKE 1 TABLET (150 MG TOTAL) BY MOUTH EVERY 30 (THIRTY) DAYS.   Lancets (ONETOUCH DELICA PLUS LANCET30G) MISC Use  as directed twice a daily DX E11.65   Magnesium 250 MG TABS Take by mouth. Takes 1 tablet 2-3 times per week   Multiple Vitamins-Minerals (MULTIVITAMIN ADULTS PO) Take 1 tablet by mouth daily.   nitrofurantoin, macrocrystal-monohydrate, (MACROBID) 100 MG capsule Take 1 capsule (100 mg total) by mouth 2 (two) times daily.   nitrofurantoin, macrocrystal-monohydrate, (MACROBID) 100 MG capsule One tab po bid x 10 days and then MW/F once a day until finished   OXYGEN Inhale 3 L into the lungs. Pt uses [facility] for Oxygen   potassium chloride SA (KLOR-CON M) 20 MEQ tablet Take 1 tablet (20 mEq total) by mouth 2 (two) times daily.   Simethicone 80 MG TABS Take 1 tablet (80 mg total) by mouth as needed (burping). As directed.   torsemide (DEMADEX) 20 MG tablet Take 1 tablet (20 mg total) by mouth daily. May take an extra 20 mg as needed for swelling   [DISCONTINUED] Fluticasone Furoate (ARNUITY ELLIPTA) 100 MCG/ACT AEPB Inhale 1 puff into the lungs daily.   [DISCONTINUED] metFORMIN (GLUCOPHAGE-XR) 500 MG 24 hr tablet Take 1  tablet (500 mg total) by mouth daily with supper.   [DISCONTINUED] montelukast (SINGULAIR) 10 MG tablet Take 10 mg by mouth daily as needed.   [DISCONTINUED] omeprazole (PRILOSEC) 40 MG capsule Take 1 capsule (40 mg total) by mouth daily.   [DISCONTINUED] Tiotropium Bromide-Olodaterol 2.5-2.5 MCG/ACT AERS Inhale 2 puffs into the lungs daily.   metFORMIN (GLUCOPHAGE-XR) 500 MG 24 hr tablet Take 1 tablet (500 mg total) by mouth daily with supper.   montelukast (SINGULAIR) 10 MG tablet Take 1 tablet (10 mg total) by mouth daily as needed.   omeprazole (PRILOSEC) 40 MG capsule Take 1 capsule (40 mg  total) by mouth daily.   [EXPIRED] ipratropium-albuterol (DUONEB) 0.5-2.5 (3) MG/3ML nebulizer solution 3 mL    No facility-administered encounter medications on file as of 12/12/2022.    Surgical History: Past Surgical History:  Procedure Laterality Date   APPENDECTOMY     cataract surgery Bilateral    CHOLECYSTECTOMY     COLONOSCOPY     COLONOSCOPY WITH PROPOFOL N/A 05/28/2015   Procedure: COLONOSCOPY WITH PROPOFOL;  Surgeon: Christena Deem, MD;  [facility] ENDOSCOPY;  Service: Endoscopy;  Laterality: N/A;   COLONOSCOPY WITH PROPOFOL N/A 10/28/2018   Procedure: COLONOSCOPY WITH PROPOFOL;  Surgeon: Christena Deem, MD;  Location: [facility] ENDOSCOPY;  Service: Endoscopy;  Laterality: N/A;   EYE SURGERY Bilateral 2015   Cataract Extraction with IOL   LITHOTRIPSY  2015   has had many stones   Mastoidotomy  1970   ROTATOR CUFF REPAIR Right 2011   TOTAL HIP ARTHROPLASTY Right 06/14/2016   Procedure: TOTAL HIP ARTHROPLASTY;  Surgeon: Donato Heinz, MD;  Location: [facility] ORS;  Service: Orthopedics;  Laterality: Right;    Medical History: Past Medical History:  Diagnosis Date   Anxiety 01/02/2012   Arthritis    Asthma    Blockage of coronary artery of heart    Chronic kidney disease    Kidney stones   Colon polyps    COPD (chronic obstructive pulmonary disease)    Diabetes mellitus without complication    GERD (gastroesophageal reflux disease)    HOH (hard of hearing)    Bilateral hearing aids   Kidney stone    Osteoporosis    Sleep apnea    Does not use C-PAP on a regular basis   Venous stasis     Family History: Family History  Problem Relation Age of Onset   Breast cancer Maternal Aunt 50   Breast cancer Maternal Aunt    Bladder Cancer Neg Hx    Kidney cancer Neg Hx     Social History   Socioeconomic History   Marital status: Divorced    Spouse name: Not on file   Number of children: Not on file   Years of education: Not on file   Highest education level:  Not on file  Occupational History   Not on file  Tobacco Use   Smoking status: Former    Packs/day: 1.00    Years: 50.00    Additional pack years: 0.00    Total pack years: 50.00    Types: Cigarettes    Quit date: 12/28/2021    Years since quitting: 0.9    Passive exposure: Never   Smokeless tobacco: Never   Tobacco comments:    1 pack daily//quit 12/28/2021  Vaping Use   Vaping Use: Never used  Substance and Sexual Activity   Alcohol use: Not Currently    Comment: very rarely    Drug use:  No   Sexual activity: Not Currently  Other Topics Concern   Not on file  Social History Narrative   Not on file   Social Determinants of Health   Financial Resource Strain: Not on file  Food Insecurity: Not on file  Transportation Needs: Not on file  Physical Activity: Insufficiently Active (09/21/2022)   Exercise Vital Sign    Days of Exercise per Week: 3 days    Minutes of Exercise per Session: 30 min  Stress: Not on file  Social Connections: Not on file  Intimate Partner Violence: Not on file      Review of Systems  Constitutional:  Negative for fatigue and fever.  HENT:  Positive for rhinorrhea. Negative for congestion, mouth sores and postnasal drip.   Respiratory:  Negative for cough.   Cardiovascular:  Negative for chest pain.  Genitourinary:  Negative for flank pain.  Musculoskeletal:  Positive for arthralgias.  Psychiatric/Behavioral:  Positive for decreased concentration and dysphoric mood.     Vital Signs: BP 114/68   Pulse 64   Temp 98.3 F (36.8 C)   Resp 16   Ht  (1.676 m)   Wt 185 lb 9.6 oz (84.2 kg)   SpO2 94%   BMI 29.96 kg/m    Physical Exam Constitutional:      Appearance: Normal appearance.  HENT:     Head: Normocephalic and atraumatic.     Nose: Nose normal.     Mouth/Throat:     Mouth: Mucous membranes are moist.     Pharynx: No posterior oropharyngeal erythema.  Eyes:     Extraocular Movements: Extraocular movements intact.      Pupils: Pupils are equal, round, and reactive to light.  Cardiovascular:     Pulses: Normal pulses.     Heart sounds: Normal heart sounds.  Pulmonary:     Effort: Pulmonary effort is normal.     Breath sounds: Normal breath sounds.  Neurological:     General: No focal deficit present.     Mental Status: She is alert.  Psychiatric:        Mood and Affect: Mood normal.        Behavior: Behavior normal.        Assessment/Plan: 1. Urinary tract infection without hematuria, site unspecified Start Macrobid suppressive therapy  - POCT Urinalysis Dipstick - CULTURE, URINE COMPREHENSIVE  2. Inadequately controlled diabetes mellitus Pt is doing well, continue to monitor diet  - metFORMIN (GLUCOPHAGE-XR) 500 MG 24 hr tablet; Take 1 tablet (500 mg total) by mouth daily with supper.  Dispense: 90 tablet; Refill: 1  3. Chronic hypoxemic respiratory failure Continue O2  - montelukast (SINGULAIR) 10 MG tablet; Take 1 tablet (10 mg total) by mouth daily as needed.  Dispense: 90 tablet; Refill: 1 - ipratropium-albuterol (DUONEB) 0.5-2.5 (3) MG/3ML nebulizer solution 3 mL  4. Mixed simple and mucopurulent chronic bronchitis Needs to be on Trelegy since Flovent is very expensive   5. Depression, major, single episode, mild Restart Wellbutrin, pt has done with smoking cessation  - buPROPion (WELLBUTRIN XL) 150 MG 24 hr tablet; Take 1 tablet (150 mg total) by mouth daily.  Dispense: 90 tablet; Refill: 3   General Counseling: Amber Holder verbalizes understanding of the findings of todays visit and agrees with plan of treatment. I have discussed any further diagnostic evaluation that may be needed or ordered today. We also reviewed her medications today. she has been encouraged to call the office with any questions or concerns that  should arise related to todays visit.    Orders Placed This Encounter  Procedures   CULTURE, URINE COMPREHENSIVE   POCT Urinalysis Dipstick    Meds ordered this  encounter  Medications   metFORMIN (GLUCOPHAGE-XR) 500 MG 24 hr tablet    Sig: Take 1 tablet (500 mg total) by mouth daily with supper.    Dispense:  90 tablet    Refill:  1   montelukast (SINGULAIR) 10 MG tablet    Sig: Take 1 tablet (10 mg total) by mouth daily as needed.    Dispense:  90 tablet    Refill:  1   buPROPion (WELLBUTRIN XL) 150 MG 24 hr tablet    Sig: Take 1 tablet (150 mg total) by mouth daily.    Dispense:  90 tablet    Refill:  3   omeprazole (PRILOSEC) 40 MG capsule    Sig: Take 1 capsule (40 mg total) by mouth daily.    Dispense:  90 capsule    Refill:  3   ipratropium-albuterol (DUONEB) 0.5-2.5 (3) MG/3ML nebulizer solution 3 mL   Fluticasone-Umeclidin-Vilant (TRELEGY ELLIPTA) 100-62.5-25 MCG/ACT AEPB    Sig: Inhale 1 puff into the lungs daily.    Dispense:  3 each    Refill:  4   nitrofurantoin, macrocrystal-monohydrate, (MACROBID) 100 MG capsule    Sig: One tab po bid x 10 days and then MW/F once a day until finished    Dispense:  60 capsule    Refill:  0    Total time spent:45 Minutes Time spent includes review of chart, medications, test results, and follow up plan with the patient.   [place] Controlled Substance Database was reviewed by me.   Dr Lyndon Code Internal medicine

## 2022-12-12 NOTE — Progress Notes (Signed)
Please send for c/s

## 2022-12-15 LAB — CULTURE, URINE COMPREHENSIVE

## 2022-12-18 ENCOUNTER — Other Ambulatory Visit: Payer: Self-pay

## 2022-12-18 MED ORDER — AZITHROMYCIN 250 MG PO TABS: 250.0000 mg | ORAL_TABLET | Freq: Every day | ORAL | 0 refills | Status: DC

## 2022-12-18 MED ORDER — PREDNISONE 10 MG PO TABS: ORAL_TABLET | ORAL | 0 refills | Status: DC

## 2022-12-18 MED ORDER — NITROFURANTOIN MONOHYD MACRO 100 MG PO CAPS: ORAL_CAPSULE | ORAL | 0 refills | Status: DC

## 2022-12-18 MED ORDER — TRELEGY ELLIPTA 100-62.5-25 MCG/ACT IN AEPB: 1.0000 | INHALATION_SPRAY | Freq: Every day | RESPIRATORY_TRACT | 4 refills | Status: DC

## 2022-12-18 NOTE — Telephone Encounter (Signed)
Pt called  that she is going on for vacation as per dr Welton Flakes send antibiotic and prednisone

## 2022-12-19 ENCOUNTER — Other Ambulatory Visit: Payer: Self-pay | Admitting: Internal Medicine

## 2022-12-19 DIAGNOSIS — J418 Mixed simple and mucopurulent chronic bronchitis: Secondary | ICD-10-CM

## 2022-12-19 DIAGNOSIS — F320 Major depressive disorder, single episode, mild: Secondary | ICD-10-CM

## 2022-12-19 DIAGNOSIS — E1165 Type 2 diabetes mellitus with hyperglycemia: Secondary | ICD-10-CM

## 2022-12-19 DIAGNOSIS — N390 Urinary tract infection, site not specified: Secondary | ICD-10-CM

## 2022-12-19 DIAGNOSIS — J9611 Chronic respiratory failure with hypoxia: Secondary | ICD-10-CM

## 2022-12-26 ENCOUNTER — Telehealth: Payer: Self-pay | Admitting: Internal Medicine

## 2022-12-29 DIAGNOSIS — H903 Sensorineural hearing loss, bilateral: Secondary | ICD-10-CM | POA: Diagnosis not present

## 2022-12-30 ENCOUNTER — Other Ambulatory Visit: Payer: Self-pay | Admitting: Internal Medicine

## 2022-12-30 DIAGNOSIS — H9193 Unspecified hearing loss, bilateral: Secondary | ICD-10-CM

## 2023-01-04 ENCOUNTER — Telehealth: Payer: Self-pay | Admitting: Internal Medicine

## 2023-01-04 NOTE — Telephone Encounter (Signed)
Per patient's request, Otolaryngology referral faxed to Dr. Elvina Mattes in [place]; [phone]

## 2023-01-08 NOTE — Telephone Encounter (Signed)
Error

## 2023-01-25 ENCOUNTER — Other Ambulatory Visit: Payer: Self-pay

## 2023-01-25 MED ORDER — TORSEMIDE 20 MG PO TABS: 20.0000 mg | ORAL_TABLET | Freq: Every day | ORAL | 1 refills | Status: DC

## 2023-01-26 ENCOUNTER — Other Ambulatory Visit: Payer: Self-pay | Admitting: Internal Medicine

## 2023-01-26 ENCOUNTER — Telehealth: Payer: Self-pay | Admitting: Internal Medicine

## 2023-01-26 DIAGNOSIS — H918X3 Other specified hearing loss, bilateral: Secondary | ICD-10-CM

## 2023-01-26 NOTE — Telephone Encounter (Signed)
Audiology referral faxed to Dr. Williemae Area; [phone]

## 2023-02-07 ENCOUNTER — Telehealth: Payer: Self-pay

## 2023-02-07 DIAGNOSIS — E876 Hypokalemia: Secondary | ICD-10-CM

## 2023-02-08 ENCOUNTER — Other Ambulatory Visit
Admission: RE | Admit: 2023-02-08 | Discharge: 2023-02-08 | Disposition: A | Discharge status: Home / Self Care | Payer: Medicare Other | Attending: Nurse Practitioner | Admitting: Nurse Practitioner

## 2023-02-08 ENCOUNTER — Other Ambulatory Visit: Payer: Self-pay

## 2023-02-08 DIAGNOSIS — E876 Hypokalemia: Secondary | ICD-10-CM | POA: Diagnosis not present

## 2023-02-08 LAB — BASIC METABOLIC PANEL
Anion gap: 8 (ref 5–15)
BUN: 22 mg/dL (ref 8–23)
CO2: 25 mmol/L (ref 22–32)
Calcium: 9.4 mg/dL (ref 8.9–10.3)
Chloride: 106 mmol/L (ref 98–111)
Creatinine, Ser: 0.74 mg/dL (ref 0.44–1.00)
GFR, Estimated: >60 mL/min (ref >60)
Glucose, Bld: 121 mg/dL — ABNORMAL HIGH (ref 70–99)
Potassium: 3.9 mmol/L (ref 3.5–5.1)
Sodium: 139 mmol/L (ref 135–145)

## 2023-02-08 NOTE — Telephone Encounter (Signed)
Patient notified

## 2023-02-11 ENCOUNTER — Other Ambulatory Visit: Payer: Self-pay | Admitting: Internal Medicine

## 2023-02-11 DIAGNOSIS — I5032 Chronic diastolic (congestive) heart failure: Secondary | ICD-10-CM

## 2023-02-12 ENCOUNTER — Encounter: Payer: Self-pay | Admitting: Physician Assistant

## 2023-02-12 ENCOUNTER — Ambulatory Visit (INDEPENDENT_AMBULATORY_CARE_PROVIDER_SITE_OTHER): Payer: Medicare Other | Admitting: Physician Assistant

## 2023-02-12 VITALS — BP 125/60 | HR 66 | Temp 98.1°F | Resp 16 | Ht 66.0 in | Wt 185.0 lb

## 2023-02-12 DIAGNOSIS — R195 Other fecal abnormalities: Secondary | ICD-10-CM

## 2023-02-12 DIAGNOSIS — J9611 Chronic respiratory failure with hypoxia: Secondary | ICD-10-CM

## 2023-02-12 DIAGNOSIS — R197 Diarrhea, unspecified: Secondary | ICD-10-CM | POA: Diagnosis not present

## 2023-02-12 DIAGNOSIS — E1165 Type 2 diabetes mellitus with hyperglycemia: Secondary | ICD-10-CM | POA: Diagnosis not present

## 2023-02-12 DIAGNOSIS — E876 Hypokalemia: Secondary | ICD-10-CM | POA: Diagnosis not present

## 2023-02-12 NOTE — Progress Notes (Signed)
[facility] [address] [place], [place] [address]  Internal MEDICINE  Office Visit Note  Patient Name: Amber Holder  [id number]  [id number]  Date of Service: 02/12/2023  Chief Complaint  Patient presents with   Follow-up   Diabetes   Gastroesophageal Reflux    HPI Pt is here for routine follow up -Labs reviewed and potassium stable, taking 2 in Am and alternating between 1 and 2 tabs in evening qod -On trip misplaced torsemide, friend had hydrochlorothiazide and took this instead. Found it on the last day of trip and has been back on it since.  -Cruise to [place] -does wear depends just in case  -Does have loose stools, may be due to metformin -has been eating pomegranate and thought she was seeing seeds in stools, but stopped and still has darker coffee ground-like stools at times. States sometimes lighter brown again but often dark. Discussed checking for blood--she has not seen any blood -taking tylenol occasionally, no NSAIDs -breathing stable, wearing her oxygen and using trelegy now which is working well  Current Medication: Outpatient Encounter Medications as of 02/12/2023  Medication Sig   acetaminophen (TYLENOL) 500 MG chewable tablet Chew 1,000 mg by mouth every 8 (eight) hours as needed for pain.   albuterol (VENTOLIN HFA) 108 (90 Base) MCG/ACT inhaler Inhale 2 puffs into the lungs every 6 (six) hours as needed for wheezing or shortness of breath.   aspirin EC 81 MG tablet Take 1 tablet (81 mg total) by mouth daily. Swallow whole.   atorvastatin (LIPITOR) 20 MG tablet Take 1 tablet (20 mg total) by mouth daily.   azithromycin (ZITHROMAX) 250 MG tablet Take 1 tablet (250 mg total) by mouth daily.   buPROPion (WELLBUTRIN XL) 150 MG 24 hr tablet Take 1 tablet (150 mg total) by mouth daily.   cholecalciferol (VITAMIN D) 400 UNITS TABS tablet Take 2,000 Units by mouth daily.   CRANBERRY PO Take 25,000 mg by mouth daily.   dapagliflozin propanediol (FARXIGA)  10 MG TABS tablet Take 1 tablet (10 mg total) by mouth daily before breakfast.   fluconazole (DIFLUCAN) 150 MG tablet Take one tab po q week for fungal infection and as needed   Fluticasone-Umeclidin-Vilant (TRELEGY ELLIPTA) 100-62.5-25 MCG/ACT AEPB USE 1 INHALATION ORALLY    DAILY.   glucose blood (ONETOUCH VERIO) test strip BLOOD SUGAR TESTING THREE TIMES DAILY . DX E11.65   hydrocortisone cream 0.5 % Apply topically as needed.    hydrocortisone cream 0.5 % Apply topically.   ibandronate (BONIVA) 150 MG tablet TAKE 1 TABLET (150 MG TOTAL) BY MOUTH EVERY 30 (THIRTY) DAYS.   Lancets (ONETOUCH DELICA PLUS LANCET30G) MISC Use  as directed twice a daily DX E11.65   Magnesium 250 MG TABS Take by mouth. Takes 1 tablet 2-3 times per week   metFORMIN (GLUCOPHAGE-XR) 500 MG 24 hr tablet Take 1 tablet (500 mg total) by mouth daily with supper.   montelukast (SINGULAIR) 10 MG tablet Take 1 tablet (10 mg total) by mouth daily as needed.   Multiple Vitamins-Minerals (MULTIVITAMIN ADULTS PO) Take 1 tablet by mouth daily.   nitrofurantoin, macrocrystal-monohydrate, (MACROBID) 100 MG capsule Take 1 capsule (100 mg total) by mouth 2 (two) times daily.   nitrofurantoin, macrocrystal-monohydrate, (MACROBID) 100 MG capsule One tab po bid x 10 days and then MW/F once a day until finished   omeprazole (PRILOSEC) 40 MG capsule Take 1 capsule (40 mg total) by mouth daily.   OXYGEN Inhale 3 L into the lungs.  Pt uses [facility] for Oxygen   potassium chloride SA (KLOR-CON M) 20 MEQ tablet Take 1 tablet (20 mEq total) by mouth 2 (two) times daily.   predniSONE (DELTASONE) 10 MG tablet Take 1 tab po 3 x day for 3 days then take 1 tab po 2 x a day for 3 days and then take 1 tab po daily for 3 days   Simethicone 80 MG TABS Take 1 tablet (80 mg total) by mouth as needed (burping). As directed.   torsemide (DEMADEX) 20 MG tablet Take 1 tablet (20 mg total) by mouth daily. May take an extra 20 mg as needed for swelling    No facility-administered encounter medications on file as of 02/12/2023.    Surgical History: Past Surgical History:  Procedure Laterality Date   APPENDECTOMY     cataract surgery Bilateral    CHOLECYSTECTOMY     COLONOSCOPY     COLONOSCOPY WITH PROPOFOL N/A 05/28/2015   Procedure: COLONOSCOPY WITH PROPOFOL;  Surgeon: Christena Deem, MD;  Location: [facility] ENDOSCOPY;  Service: Endoscopy;  Laterality: N/A;   COLONOSCOPY WITH PROPOFOL N/A 10/28/2018   Procedure: COLONOSCOPY WITH PROPOFOL;  Surgeon: Christena Deem, MD;  Location: [facility] ENDOSCOPY;  Service: Endoscopy;  Laterality: N/A;   EYE SURGERY Bilateral 2015   Cataract Extraction with IOL   LITHOTRIPSY  2015   has had many stones   Mastoidotomy  1970   ROTATOR CUFF REPAIR Right 2011   TOTAL HIP ARTHROPLASTY Right 06/14/2016   Procedure: TOTAL HIP ARTHROPLASTY;  Surgeon: Donato Heinz, MD;  Location: [facility] ORS;  Service: Orthopedics;  Laterality: Right;    Medical History: Past Medical History:  Diagnosis Date   Anxiety 01/02/2012   Arthritis    Asthma    Blockage of coronary artery of heart (HCC)    Chronic kidney disease    Kidney stones   Colon polyps    COPD (chronic obstructive pulmonary disease) (HCC)    Diabetes mellitus without complication (HCC)    GERD (gastroesophageal reflux disease)    HOH (hard of hearing)    Bilateral hearing aids   Kidney stone    Osteoporosis    Sleep apnea    Does not use C-PAP on a regular basis   Venous stasis     Family History: Family History  Problem Relation Age of Onset   Breast cancer Maternal Aunt 50   Breast cancer Maternal Aunt    Bladder Cancer Neg Hx    Kidney cancer Neg Hx     Social History   Socioeconomic History   Marital status: Divorced    Spouse name: Not on file   Number of children: Not on file   Years of education: Not on file   Highest education level: Not on file  Occupational History   Not on file  Tobacco Use   Smoking status: Former     Packs/day: 1.00    Years: 50.00    Additional pack years: 0.00    Total pack years: 50.00    Types: Cigarettes    Quit date: 12/28/2021    Years since quitting: 1.1    Passive exposure: Never   Smokeless tobacco: Never   Tobacco comments:    1 pack daily//quit 12/28/2021  Vaping Use   Vaping Use: Never used  Substance and Sexual Activity   Alcohol use: Not Currently    Comment: very rarely    Drug use: No   Sexual activity: Not Currently  Other Topics Concern   Not on file  Social History Narrative   Not on file   Social Determinants of Health   Financial Resource Strain: Not on file  Food Insecurity: Not on file  Transportation Needs: Not on file  Physical Activity: Insufficiently Active (09/21/2022)   Exercise Vital Sign    Days of Exercise per Week: 3 days    Minutes of Exercise per Session: 30 min  Stress: Not on file  Social Connections: Not on file  Intimate Partner Violence: Not on file      Review of Systems  Constitutional:  Negative for chills, fatigue and unexpected weight change.  HENT:  Positive for postnasal drip. Negative for congestion, rhinorrhea, sneezing and sore throat.   Eyes:  Negative for redness.  Respiratory:  Negative for cough, chest tightness and shortness of breath.   Cardiovascular:  Negative for chest pain and palpitations.  Gastrointestinal:  Positive for diarrhea. Negative for abdominal pain, constipation, nausea and vomiting.  Genitourinary:  Negative for dysuria and frequency.  Musculoskeletal:  Positive for arthralgias. Negative for back pain, joint swelling and neck pain.  Skin:  Negative for rash.  Neurological: Negative.  Negative for tremors and numbness.  Hematological:  Negative for adenopathy. Does not bruise/bleed easily.  Psychiatric/Behavioral:  Negative for behavioral problems (Depression), sleep disturbance and suicidal ideas. The patient is not nervous/anxious.     Vital Signs: BP 125/60   Pulse 66   Temp 98.1 F  (36.7 C)   Resp 16   Ht 5\' 6"  (1.676 m)   Wt 185 lb (83.9 kg)   SpO2 93%   BMI 29.86 kg/m    Physical Exam Constitutional:      Appearance: Normal appearance.  HENT:     Head: Normocephalic and atraumatic.     Nose: Nose normal.     Mouth/Throat:     Mouth: Mucous membranes are moist.     Pharynx: No posterior oropharyngeal erythema.  Eyes:     Extraocular Movements: Extraocular movements intact.     Pupils: Pupils are equal, round, and reactive to light.  Cardiovascular:     Rate and Rhythm: Normal rate and regular rhythm.     Pulses: Normal pulses.     Heart sounds: Normal heart sounds.  Pulmonary:     Effort: Pulmonary effort is normal.     Breath sounds: Normal breath sounds.  Neurological:     General: No focal deficit present.     Mental Status: She is alert.  Psychiatric:        Mood and Affect: Mood normal.        Behavior: Behavior normal.        Assessment/Plan: 1. Chronic hypoxemic respiratory failure (HCC) Continue oxygen as before along with inhalers  2. Hypokalemia Stable on labs, continue potassium supplement  3. Type 2 diabetes mellitus with hyperglycemia, without long-term current use of insulin (HCC) Stable, may need to change metformin due to diarrhea--will check for other causes  4. Dark stools Will check stool sample - Fecal occult blood, imunochemical - Cdiff NAA+O+P+Stool Culture  5. Diarrhea, unspecified type May need to d/c metformin as possible cause - Fecal occult blood, imunochemical - Cdiff NAA+O+P+Stool Culture   General Counseling: Alizah verbalizes understanding of the findings of todays visit and agrees with plan of treatment. I have discussed any further diagnostic evaluation that may be needed or ordered today. We also reviewed her medications today. she has been encouraged to call the office with any  questions or concerns that should arise related to todays visit.    Orders Placed This Encounter  Procedures   Fecal  occult blood, imunochemical   Cdiff NAA+O+P+Stool Culture    No orders of the defined types were placed in this encounter.   This patient was seen by Lynn Ito, PA-C in collaboration with Dr. Beverely Risen as a part of collaborative care agreement.   Total time spent:30 Minutes Time spent includes review of chart, medications, test results, and follow up plan with the patient.      Dr Lyndon Code Internal medicine

## 2023-02-15 ENCOUNTER — Other Ambulatory Visit
Admission: RE | Admit: 2023-02-15 | Discharge: 2023-02-15 | Disposition: A | Discharge status: Home / Self Care | Payer: Medicare Other | Attending: Physician Assistant | Admitting: Physician Assistant

## 2023-02-19 ENCOUNTER — Other Ambulatory Visit
Admission: RE | Admit: 2023-02-19 | Discharge: 2023-02-19 | Disposition: A | Discharge status: Home / Self Care | Payer: Medicare Other | Source: Ambulatory Visit | Attending: Physician Assistant | Admitting: Physician Assistant

## 2023-02-19 DIAGNOSIS — R197 Diarrhea, unspecified: Secondary | ICD-10-CM | POA: Diagnosis not present

## 2023-02-19 DIAGNOSIS — R195 Other fecal abnormalities: Secondary | ICD-10-CM | POA: Insufficient documentation

## 2023-02-19 LAB — C DIFFICILE QUICK SCREEN W PCR REFLEX
C Diff antigen: NEGATIVE
C Diff interpretation: NOT DETECTED
C Diff toxin: NEGATIVE

## 2023-02-19 LAB — OCCULT BLOOD X 1 CARD TO LAB, STOOL: Fecal Occult Bld: NEGATIVE

## 2023-02-21 LAB — STOOL CULTURE REFLEX - RSASHR

## 2023-02-21 LAB — STOOL CULTURE: E coli, Shiga toxin Assay: NEGATIVE

## 2023-02-22 LAB — OVA + PARASITE EXAM

## 2023-02-22 LAB — O&P RESULT

## 2023-02-23 LAB — STOOL CULTURE

## 2023-02-23 LAB — STOOL CULTURE REFLEX - CMPCXR

## 2023-02-26 ENCOUNTER — Other Ambulatory Visit: Payer: Self-pay | Admitting: Internal Medicine

## 2023-02-26 DIAGNOSIS — J418 Mixed simple and mucopurulent chronic bronchitis: Secondary | ICD-10-CM

## 2023-02-26 DIAGNOSIS — E1165 Type 2 diabetes mellitus with hyperglycemia: Secondary | ICD-10-CM

## 2023-02-26 DIAGNOSIS — N390 Urinary tract infection, site not specified: Secondary | ICD-10-CM

## 2023-02-26 DIAGNOSIS — J9611 Chronic respiratory failure with hypoxia: Secondary | ICD-10-CM

## 2023-02-26 DIAGNOSIS — F320 Major depressive disorder, single episode, mild: Secondary | ICD-10-CM

## 2023-02-28 ENCOUNTER — Telehealth: Payer: Self-pay

## 2023-02-28 NOTE — Telephone Encounter (Signed)
Spoke with pt and advised that stopped metformin and keep log for Glucose reading and call back office with glucose no

## 2023-02-28 NOTE — Telephone Encounter (Signed)
-----   Message from Carlean Jews, PA-C sent at 02/28/2023 12:47 PM EDT ----- Have her monitor BG with stopping, A1c very controlled and may do fine with [place] alone. If elevated sugars consider 1mg  amaryl

## 2023-02-28 NOTE — Progress Notes (Signed)
Have her monitor BG with stopping, A1c very controlled and may do fine with [place] alone. If elevated sugars consider 1mg  amaryl

## 2023-03-08 ENCOUNTER — Other Ambulatory Visit: Payer: Self-pay

## 2023-03-08 ENCOUNTER — Telehealth: Payer: Self-pay

## 2023-03-08 DIAGNOSIS — E1165 Type 2 diabetes mellitus with hyperglycemia: Secondary | ICD-10-CM

## 2023-03-08 MED ORDER — ONETOUCH VERIO VI STRP
ORAL_STRIP | 3 refills | Status: DC
Start: 2023-03-08 — End: 2023-03-12

## 2023-03-08 NOTE — Telephone Encounter (Signed)
As per lauren advised her to hold on metformin for now and keep log for glucose and call us back if glucose high and watch her diet

## 2023-03-12 ENCOUNTER — Other Ambulatory Visit: Payer: Self-pay

## 2023-03-12 DIAGNOSIS — E1165 Type 2 diabetes mellitus with hyperglycemia: Secondary | ICD-10-CM

## 2023-03-12 MED ORDER — ONETOUCH VERIO VI STRP
ORAL_STRIP | 3 refills | Status: AC
Start: 2023-03-12 — End: ?

## 2023-03-26 ENCOUNTER — Other Ambulatory Visit: Payer: Self-pay

## 2023-03-26 MED ORDER — TORSEMIDE 20 MG PO TABS: 20.0000 mg | ORAL_TABLET | Freq: Every day | ORAL | 6 refills | Status: DC

## 2023-03-26 NOTE — Telephone Encounter (Signed)
Requested Prescriptions   Signed Prescriptions Disp Refills   torsemide (DEMADEX) 20 MG tablet 45 tablet 6    Sig: Take 1 tablet (20 mg total) by mouth daily. May take an extra 20 mg as needed for swelling    Authorizing Provider: Debbe Odea    Ordering User: Guerry Minors

## 2023-04-25 ENCOUNTER — Other Ambulatory Visit: Payer: Self-pay | Admitting: Internal Medicine

## 2023-05-15 DIAGNOSIS — D164 Benign neoplasm of bones of skull and face: Secondary | ICD-10-CM | POA: Diagnosis not present

## 2023-05-15 DIAGNOSIS — H6121 Impacted cerumen, right ear: Secondary | ICD-10-CM | POA: Diagnosis not present

## 2023-05-15 DIAGNOSIS — H903 Sensorineural hearing loss, bilateral: Secondary | ICD-10-CM | POA: Diagnosis not present

## 2023-05-24 ENCOUNTER — Ambulatory Visit: Payer: Medicare Other | Admitting: Physician Assistant

## 2023-05-25 ENCOUNTER — Ambulatory Visit: Payer: Medicare Other | Admitting: Physician Assistant

## 2023-05-31 ENCOUNTER — Other Ambulatory Visit: Payer: Self-pay | Admitting: *Deleted

## 2023-05-31 ENCOUNTER — Ambulatory Visit
Admission: RE | Admit: 2023-05-31 | Discharge: 2023-05-31 | Disposition: A | Discharge status: Home / Self Care | Payer: Medicare Other | Source: Ambulatory Visit | Attending: Urology | Admitting: Urology

## 2023-05-31 ENCOUNTER — Encounter: Payer: Self-pay | Admitting: Urology

## 2023-05-31 ENCOUNTER — Ambulatory Visit: Payer: Medicare Other | Admitting: Urology

## 2023-05-31 VITALS — BP 109/63 | HR 78 | Ht 66.0 in

## 2023-05-31 DIAGNOSIS — Z96641 Presence of right artificial hip joint: Secondary | ICD-10-CM | POA: Diagnosis not present

## 2023-05-31 DIAGNOSIS — R5383 Other fatigue: Secondary | ICD-10-CM

## 2023-05-31 DIAGNOSIS — N200 Calculus of kidney: Secondary | ICD-10-CM | POA: Insufficient documentation

## 2023-05-31 DIAGNOSIS — R300 Dysuria: Secondary | ICD-10-CM | POA: Diagnosis not present

## 2023-05-31 DIAGNOSIS — I700 Atherosclerosis of aorta: Secondary | ICD-10-CM | POA: Diagnosis not present

## 2023-05-31 DIAGNOSIS — Z87440 Personal history of urinary (tract) infections: Secondary | ICD-10-CM | POA: Diagnosis not present

## 2023-05-31 DIAGNOSIS — Z87442 Personal history of urinary calculi: Secondary | ICD-10-CM | POA: Diagnosis not present

## 2023-05-31 DIAGNOSIS — N390 Urinary tract infection, site not specified: Secondary | ICD-10-CM | POA: Diagnosis not present

## 2023-05-31 LAB — URINALYSIS, COMPLETE
Bilirubin, UA: NEGATIVE
Ketones, UA: NEGATIVE
Leukocytes,UA: NEGATIVE
Nitrite, UA: NEGATIVE
Protein,UA: NEGATIVE
Specific Gravity, UA: 1.015 (ref 1.005–1.030)
Urobilinogen, Ur: 0.2 mg/dL (ref 0.2–1.0)
pH, UA: 6 (ref 5.0–7.5)

## 2023-05-31 LAB — MICROSCOPIC EXAMINATION

## 2023-05-31 MED ORDER — ESTRADIOL 0.1 MG/GM VA CREA
TOPICAL_CREAM | VAGINAL | 12 refills | Status: DC
Start: 2023-05-31 — End: 2023-11-29

## 2023-05-31 NOTE — Progress Notes (Signed)
05/31/2023 2:07 PM   Julien Girt [birth year] [id number]  Reason for visit: Follow up recurrent UTI, nephrolithiasis  HPI: 77 year-old female who previously was managed by Dr. Achilles Dunk for the above issues.  She previously was on long-term nitrofurantoin prophylaxis, and we were able to discontinue this and change her over to cranberry tablet prophylaxis.  She has a known 6 mm nonobstructing left upper pole stone that has been stable.  I personally viewed and interpreted her KUB today that does not show definitive upper pole stone, stone may have migrated more to the mid pole, or past.  We discussed limitations of KUB today.  She has had an increase in UTIs over the last year with primary symptoms of fatigue/weakness, dysuria-cultures included Enterococcus in February and April 2024, Klebsiella November 2023.  She is at the point where she is interested in starting the topical estrogen cream we had previously recommended.  Risk and benefits were discussed.  We also reviewed the torsemide contributes to her urinary frequency.  She feels she could have a UTI today with some weakness and occasional dysuria and would like to give a urine sample.  Urine and culture are pending today.  -Start topical estrogen cream for recurrent UTI -Continue cranberry tablets for UTI prophylaxis -Follow-up urine and urine culture today, antibiotics if positive -RTC 6 months  Sondra Come, MD  [facility] [address] [place], [place] [address] [phone]

## 2023-06-03 LAB — CULTURE, URINE COMPREHENSIVE

## 2023-06-07 ENCOUNTER — Encounter: Payer: Self-pay | Admitting: Physician Assistant

## 2023-06-07 ENCOUNTER — Ambulatory Visit (INDEPENDENT_AMBULATORY_CARE_PROVIDER_SITE_OTHER): Payer: Medicare Other | Admitting: Physician Assistant

## 2023-06-07 VITALS — BP 110/70 | HR 69 | Temp 98.0°F | Resp 16 | Ht 66.0 in | Wt 194.0 lb

## 2023-06-07 DIAGNOSIS — J4489 Other specified chronic obstructive pulmonary disease: Secondary | ICD-10-CM

## 2023-06-07 DIAGNOSIS — Z0001 Encounter for general adult medical examination with abnormal findings: Secondary | ICD-10-CM

## 2023-06-07 DIAGNOSIS — E782 Mixed hyperlipidemia: Secondary | ICD-10-CM | POA: Diagnosis not present

## 2023-06-07 DIAGNOSIS — Z87891 Personal history of nicotine dependence: Secondary | ICD-10-CM

## 2023-06-07 MED ORDER — PNEUMOCOCCAL 20-VAL CONJ VACC 0.5 ML IM SUSY: 0.5000 mL | PREFILLED_SYRINGE | INTRAMUSCULAR | 0 refills | Status: AC

## 2023-06-07 MED ORDER — ATORVASTATIN CALCIUM 40 MG PO TABS
40.0000 mg | ORAL_TABLET | Freq: Every day | ORAL | 3 refills | Status: DC
Start: 2023-06-07 — End: 2024-02-25

## 2023-06-07 NOTE — Progress Notes (Signed)
[facility] [address] [place], [place] [address]  Internal MEDICINE  Office Visit Note  Patient Name: Amber Holder  [id number]  [id number]  Date of Service: 06/26/2023  Chief Complaint  Patient presents with  . Medicare Wellness  . Diabetes  . Anxiety  . Gastroesophageal Reflux    HPI Amber Holder presents for an annual well visit and physical exam.  Well-appearing 77 y.o. female Routine CRC screening: Done in 2020 Routine mammogram: Done in match 2024 Eye exam and/or foot exam: eye exam in Feb, foot exam slightly reduced sensation Labs: lab slip given Other concerns: being off metformin was doing well but not some higher sugars up to 150 at times, other times 111. Will continue to monitor -Due for CT lung screening     06/07/2023    9:06 AM 05/19/2022   10:13 AM 05/13/2021   11:14 AM  MMSE - Mini Mental State Exam  Orientation to time 5 5 5   Orientation to Place 5 5 5   Registration 3 3 3   Attention/ Calculation 5 5 5   Recall 3 3 3   Language- name 2 objects 2 2 2   Language- repeat 1 1 1   Language- follow 3 step command 3 3 3   Language- read & follow direction 1 1 1   Write a sentence 1 1 1   Copy design 1 1 1   Total score 30 30 30     Functional Status Survey:       09/21/2022    9:43 AM 12/12/2022    9:22 AM 02/12/2023    9:49 AM 06/07/2023    9:07 AM 06/07/2023    9:09 AM  Fall Risk  Falls in the past year? 0 0 0 0 0  Was there an injury with Fall? 0      Fall Risk Category Calculator 0      Patient at Risk for Falls Due to Impaired mobility   No Fall Risks No Fall Risks  Fall risk Follow up Falls evaluation completed           06/07/2023    9:08 AM  Depression screen PHQ 2/9  Decreased Interest 0  Down, Depressed, Hopeless 0  PHQ - 2 Score 0        No data to display            Current Medication: Outpatient Encounter Medications as of 06/07/2023  Medication Sig  . acetaminophen (TYLENOL) 500 MG chewable tablet Chew 1,000 mg  by mouth every 8 (eight) hours as needed for pain.  Marland Kitchen albuterol (VENTOLIN HFA) 108 (90 Base) MCG/ACT inhaler Inhale 2 puffs into the lungs every 6 (six) hours as needed for wheezing or shortness of breath.  Marland Kitchen aspirin EC 81 MG tablet Take 1 tablet (81 mg total) by mouth daily. Swallow whole.  Marland Kitchen atorvastatin (LIPITOR) 40 MG tablet Take 1 tablet (40 mg total) by mouth daily.  . cholecalciferol (VITAMIN D) 400 UNITS TABS tablet Take 2,000 Units by mouth daily.  Marland Kitchen CRANBERRY PO Take 25,000 mg by mouth daily.  Marland Kitchen estradiol (ESTRACE) 0.1 MG/GM vaginal cream Estrogen Cream Instruction Discard applicator Apply pea sized amount to tip of finger to urethra before bed. Wash hands well after application. Use Monday, Wednesday and Friday  . FARXIGA 10 MG TABS tablet TAKE 1 TABLET BY MOUTH DAILY BEFORE BREAKFAST.  . fluconazole (DIFLUCAN) 150 MG tablet Take one tab po q week for fungal infection and as needed  . Fluticasone-Umeclidin-Vilant (TRELEGY ELLIPTA) 100-62.5-25 MCG/ACT AEPB USE 1  INHALATION ORALLY    DAILY.  Marland Kitchen glucose blood (ONETOUCH VERIO) test strip BLOOD SUGAR TESTING ONCE DAILY . DX E11.65  . hydrocortisone cream 0.5 % Apply topically as needed.   . hydrocortisone cream 0.5 % Apply topically.  . ibandronate (BONIVA) 150 MG tablet TAKE 1 TABLET (150 MG TOTAL) BY MOUTH EVERY 30 (THIRTY) DAYS.  Marland Kitchen KLOR-CON M20 20 MEQ tablet TAKE 2 TABLETS EVERY MORNING AND 1 TABLET AT NIGHT AND THEN MAY TAKE ONE EXTRA PER DOCTOR DIRECTION. DOSE INCREASE  . Lancets (ONETOUCH DELICA PLUS LANCET30G) MISC Use  as directed twice a daily DX E11.65  . Magnesium 250 MG TABS Take by mouth. Takes 1 tablet 2-3 times per week  . metFORMIN (GLUCOPHAGE-XR) 500 MG 24 hr tablet TAKE 1 TABLET DAILY WITH   SUPPER  . montelukast (SINGULAIR) 10 MG tablet Take 1 tablet (10 mg total) by mouth daily as needed.  . Multiple Vitamins-Minerals (MULTIVITAMIN ADULTS PO) Take 1 tablet by mouth daily.  . nitrofurantoin, macrocrystal-monohydrate,  (MACROBID) 100 MG capsule Take 1 capsule (100 mg total) by mouth 2 (two) times daily.  . nitrofurantoin, macrocrystal-monohydrate, (MACROBID) 100 MG capsule One tab po bid x 10 days and then MW/F once a day until finished  . omeprazole (PRILOSEC) 40 MG capsule Take 1 capsule (40 mg total) by mouth daily. (Patient taking differently: Take 40 mg by mouth as needed.)  . OXYGEN Inhale 3 L into the lungs. Pt uses [facility] for Oxygen  . predniSONE (DELTASONE) 10 MG tablet Take 1 tab po 3 x day for 3 days then take 1 tab po 2 x a day for 3 days and then take 1 tab po daily for 3 days  . Simethicone 80 MG TABS Take 1 tablet (80 mg total) by mouth as needed (burping). As directed. (Patient taking differently: Take 1 tablet by mouth as needed (burping).)  . torsemide (DEMADEX) 20 MG tablet Take 1 tablet (20 mg total) by mouth daily. May take an extra 20 mg as needed for swelling  . [DISCONTINUED] atorvastatin (LIPITOR) 20 MG tablet Take 1 tablet (20 mg total) by mouth daily.  . [DISCONTINUED] pneumococcal 20-valent conjugate vaccine (PREVNAR 20) 0.5 ML injection Inject 0.5 mLs into the muscle tomorrow at 10 am.  . [EXPIRED] pneumococcal 20-valent conjugate vaccine (PREVNAR 20) 0.5 ML injection Inject 0.5 mLs into the muscle tomorrow at 10 am for 1 dose.   No facility-administered encounter medications on file as of 06/07/2023.    Surgical History: Past Surgical History:  Procedure Laterality Date  . APPENDECTOMY    . cataract surgery Bilateral   . CHOLECYSTECTOMY    . COLONOSCOPY    . COLONOSCOPY WITH PROPOFOL N/A 05/28/2015   Procedure: COLONOSCOPY WITH PROPOFOL;  Surgeon: Christena Deem, MD;  Location: [facility] ENDOSCOPY;  Service: Endoscopy;  Laterality: N/A;  . COLONOSCOPY WITH PROPOFOL N/A 10/28/2018   Procedure: COLONOSCOPY WITH PROPOFOL;  Surgeon: Christena Deem, MD;  Location: [facility] ENDOSCOPY;  Service: Endoscopy;  Laterality: N/A;  . EYE SURGERY Bilateral 2015   Cataract Extraction  with IOL  . LITHOTRIPSY  2015   has had many stones  . Mastoidotomy  1970  . ROTATOR CUFF REPAIR Right 2011  . TOTAL HIP ARTHROPLASTY Right 06/14/2016   Procedure: TOTAL HIP ARTHROPLASTY;  Surgeon: Donato Heinz, MD;  Location: [facility] ORS;  Service: Orthopedics;  Laterality: Right;    Medical History: Past Medical History:  Diagnosis Date  . Anxiety 01/02/2012  . Arthritis   .  Asthma   . Blockage of coronary artery of heart (HCC)   . Chronic kidney disease    Kidney stones  . Colon polyps   . COPD (chronic obstructive pulmonary disease) (HCC)   . Diabetes mellitus without complication (HCC)   . GERD (gastroesophageal reflux disease)   . HOH (hard of hearing)    Bilateral hearing aids  . Kidney stone   . Osteoporosis   . Sleep apnea    Does not use C-PAP on a regular basis  . Venous stasis     Family History: Family History  Problem Relation Age of Onset  . Breast cancer Maternal Aunt 50  . Breast cancer Maternal Aunt   . Bladder Cancer Neg Hx   . Kidney cancer Neg Hx     Social History   Socioeconomic History  . Marital status: Divorced    Spouse name: Not on file  . Number of children: Not on file  . Years of education: Not on file  . Highest education level: Not on file  Occupational History  . Not on file  Tobacco Use  . Smoking status: Former    Current packs/day: 0.00    Average packs/day: 1 pack/day for 50.0 years (50.0 ttl pk-yrs)    Types: Cigarettes    Start date: 12/29/1971    Quit date: 12/28/2021    Years since quitting: 1.4    Passive exposure: Never  . Smokeless tobacco: Never  . Tobacco comments:    1 pack daily//quit 12/28/2021  Vaping Use  . Vaping status: Never Used  Substance and Sexual Activity  . Alcohol use: Not Currently    Comment: very rarely   . Drug use: No  . Sexual activity: Not Currently    Birth control/protection: Post-menopausal  Other Topics Concern  . Not on file  Social History Narrative  . Not on file   Social  Determinants of Health   Financial Resource Strain: Not on file  Food Insecurity: Not on file  Transportation Needs: Not on file  Physical Activity: Insufficiently Active (09/21/2022)   Exercise Vital Sign   . Days of Exercise per Week: 3 days   . Minutes of Exercise per Session: 30 min  Stress: Not on file  Social Connections: Not on file  Intimate Partner Violence: Not on file      Review of Systems  Constitutional:  Negative for chills, fatigue and unexpected weight change.  HENT:  Positive for postnasal drip. Negative for congestion, rhinorrhea, sneezing and sore throat.   Eyes:  Negative for redness.  Respiratory:  Negative for cough, chest tightness and shortness of breath.   Cardiovascular:  Negative for chest pain and palpitations.  Gastrointestinal:  Negative for abdominal pain, constipation, nausea and vomiting.  Genitourinary:  Negative for dysuria and frequency.  Musculoskeletal:  Positive for arthralgias. Negative for back pain, joint swelling and neck pain.  Skin:  Negative for rash.  Neurological: Negative.  Negative for tremors.  Hematological:  Negative for adenopathy. Does not bruise/bleed easily.  Psychiatric/Behavioral:  Negative for behavioral problems (Depression), sleep disturbance and suicidal ideas. The patient is not nervous/anxious.     Vital Signs: BP 110/70   Pulse 69   Temp 98 F (36.7 C)   Resp 16   Ht 5\' 6"  (1.676 m)   Wt 194 lb (88 kg)   SpO2 97%   BMI 31.31 kg/m    Physical Exam Vitals and nursing note reviewed.  Constitutional:      Appearance:  Normal appearance.  HENT:     Head: Normocephalic and atraumatic.  Cardiovascular:     Rate and Rhythm: Normal rate and regular rhythm.     Pulses: Normal pulses.     Heart sounds: Normal heart sounds.  Pulmonary:     Effort: Pulmonary effort is normal.     Breath sounds: Normal breath sounds.  Neurological:     General: No focal deficit present.     Mental Status: She is alert.   Psychiatric:        Mood and Affect: Mood normal.        Behavior: Behavior normal.       Assessment/Plan: 1. Encounter for general adult medical examination with abnormal findings MCW performed, lab slip given, CT chest screening ordered  2. Obstructive chronic bronchitis without exacerbation (HCC) - CT CHEST LUNG CA SCREEN LOW DOSE W/O CM; Future  3. Former smoker - CT CHEST LUNG CA SCREEN LOW DOSE W/O CM; Future  4. Mixed hyperlipidemia - atorvastatin (LIPITOR) 40 MG tablet; Take 1 tablet (40 mg total) by mouth daily.  Dispense: 90 tablet; Refill: 3     General Counseling: Dorean verbalizes understanding of the findings of todays visit and agrees with plan of treatment. I have discussed any further diagnostic evaluation that may be needed or ordered today. We also reviewed her medications today. she has been encouraged to call the office with any questions or concerns that should arise related to todays visit.    Orders Placed This Encounter  Procedures  . CT CHEST LUNG CA SCREEN LOW DOSE W/O CM    Meds ordered this encounter  Medications  . atorvastatin (LIPITOR) 40 MG tablet    Sig: Take 1 tablet (40 mg total) by mouth daily.    Dispense:  90 tablet    Refill:  3  . pneumococcal 20-valent conjugate vaccine (PREVNAR 20) 0.5 ML injection    Sig: Inject 0.5 mLs into the muscle tomorrow at 10 am for 1 dose.    Dispense:  0.5 mL    Refill:  0    Return for after test is done.   Total time spent:35 Minutes Time spent includes review of chart, medications, test results, and follow up plan with the patient.   [place] Controlled Substance Database was reviewed by me.  This patient was seen by Lynn Ito, PA-C in collaboration with Dr. Beverely Risen as a part of collaborative care agreement.  Lynn Ito, PA-C Internal medicine

## 2023-06-11 ENCOUNTER — Other Ambulatory Visit
Admission: RE | Admit: 2023-06-11 | Discharge: 2023-06-11 | Disposition: A | Discharge status: Home / Self Care | Payer: Medicare Other | Attending: Physician Assistant | Admitting: Physician Assistant

## 2023-06-11 DIAGNOSIS — E538 Deficiency of other specified B group vitamins: Secondary | ICD-10-CM | POA: Insufficient documentation

## 2023-06-11 DIAGNOSIS — E1165 Type 2 diabetes mellitus with hyperglycemia: Secondary | ICD-10-CM | POA: Diagnosis not present

## 2023-06-11 DIAGNOSIS — E559 Vitamin D deficiency, unspecified: Secondary | ICD-10-CM | POA: Insufficient documentation

## 2023-06-11 DIAGNOSIS — R5383 Other fatigue: Secondary | ICD-10-CM | POA: Insufficient documentation

## 2023-06-11 DIAGNOSIS — E785 Hyperlipidemia, unspecified: Secondary | ICD-10-CM | POA: Diagnosis not present

## 2023-06-11 LAB — CBC WITH DIFFERENTIAL/PLATELET
Abs Immature Granulocytes: 0.01 10*3/uL (ref 0.00–0.07)
Basophils Absolute: 0 10*3/uL (ref 0.0–0.1)
Basophils Relative: 1 %
Eosinophils Absolute: 0.1 10*3/uL (ref 0.0–0.5)
Eosinophils Relative: 1 %
HCT: 44.7 % (ref 36.0–46.0)
Hemoglobin: 14.5 g/dL (ref 12.0–15.0)
Immature Granulocytes: 0 %
Lymphocytes Relative: 53 %
Lymphs Abs: 3.3 10*3/uL (ref 0.7–4.0)
MCH: 29.2 pg (ref 26.0–34.0)
MCHC: 32.4 g/dL (ref 30.0–36.0)
MCV: 90.1 fL (ref 80.0–100.0)
Monocytes Absolute: 0.5 10*3/uL (ref 0.1–1.0)
Monocytes Relative: 8 %
Neutro Abs: 2.3 10*3/uL (ref 1.7–7.7)
Neutrophils Relative %: 37 %
Platelets: 214 10*3/uL (ref 150–400)
RBC: 4.96 MIL/uL (ref 3.87–5.11)
RDW: 13 % (ref 11.5–15.5)
WBC: 6.3 10*3/uL (ref 4.0–10.5)
nRBC: 0 % (ref 0.0–0.2)

## 2023-06-11 LAB — COMPREHENSIVE METABOLIC PANEL
ALT: 19 U/L (ref 0–44)
AST: 19 U/L (ref 15–41)
Albumin: 4.1 g/dL (ref 3.5–5.0)
Alkaline Phosphatase: 57 U/L (ref 38–126)
Anion gap: 8 (ref 5–15)
BUN: 23 mg/dL (ref 8–23)
CO2: 25 mmol/L (ref 22–32)
Calcium: 9 mg/dL (ref 8.9–10.3)
Chloride: 105 mmol/L (ref 98–111)
Creatinine, Ser: 0.76 mg/dL (ref 0.44–1.00)
GFR, Estimated: >60 mL/min (ref >60)
Glucose, Bld: 115 mg/dL — ABNORMAL HIGH (ref 70–99)
Potassium: 4.3 mmol/L (ref 3.5–5.1)
Sodium: 138 mmol/L (ref 135–145)
Total Bilirubin: 0.6 mg/dL (ref 0.3–1.2)
Total Protein: 7.1 g/dL (ref 6.5–8.1)

## 2023-06-11 LAB — LIPID PANEL
Cholesterol: 169 mg/dL (ref 0–200)
HDL: 57 mg/dL (ref >40)
LDL Cholesterol: 96 mg/dL (ref 0–99)
Total CHOL/HDL Ratio: 3 {ratio}
Triglycerides: 79 mg/dL (ref <150)
VLDL: 16 mg/dL (ref 0–40)

## 2023-06-11 LAB — T4, FREE: Free T4: 0.8 ng/dL (ref 0.61–1.12)

## 2023-06-11 LAB — VITAMIN B12: Vitamin B-12: 269 pg/mL (ref 180–914)

## 2023-06-11 LAB — FOLATE: Folate: 27 ng/mL (ref >5.9)

## 2023-06-11 LAB — VITAMIN D 25 HYDROXY (VIT D DEFICIENCY, FRACTURES): Vit D, 25-Hydroxy: 32.94 ng/mL (ref 30–100)

## 2023-06-11 LAB — HEMOGLOBIN A1C
Hgb A1c MFr Bld: 6.6 % — ABNORMAL HIGH (ref 4.8–5.6)
Mean Plasma Glucose: 142.72 mg/dL

## 2023-06-11 LAB — TSH: TSH: 1.797 u[IU]/mL (ref 0.350–4.500)

## 2023-06-11 LAB — LDL CHOLESTEROL, DIRECT: Direct LDL: 100 mg/dL — ABNORMAL HIGH (ref 0–99)

## 2023-06-15 DIAGNOSIS — K219 Gastro-esophageal reflux disease without esophagitis: Secondary | ICD-10-CM | POA: Diagnosis not present

## 2023-06-15 DIAGNOSIS — R159 Full incontinence of feces: Secondary | ICD-10-CM | POA: Diagnosis not present

## 2023-06-15 DIAGNOSIS — K580 Irritable bowel syndrome with diarrhea: Secondary | ICD-10-CM | POA: Diagnosis not present

## 2023-06-19 DIAGNOSIS — Z96641 Presence of right artificial hip joint: Secondary | ICD-10-CM | POA: Diagnosis not present

## 2023-06-21 ENCOUNTER — Ambulatory Visit
Admission: RE | Admit: 2023-06-21 | Discharge: 2023-06-21 | Disposition: A | Discharge status: Home / Self Care | Payer: Medicare Other | Source: Ambulatory Visit | Attending: Physician Assistant | Admitting: Physician Assistant

## 2023-06-21 DIAGNOSIS — Z87891 Personal history of nicotine dependence: Secondary | ICD-10-CM | POA: Diagnosis not present

## 2023-06-21 DIAGNOSIS — Z136 Encounter for screening for cardiovascular disorders: Secondary | ICD-10-CM | POA: Diagnosis not present

## 2023-06-21 DIAGNOSIS — Z122 Encounter for screening for malignant neoplasm of respiratory organs: Secondary | ICD-10-CM | POA: Diagnosis not present

## 2023-06-21 DIAGNOSIS — J4489 Other specified chronic obstructive pulmonary disease: Secondary | ICD-10-CM

## 2023-06-28 ENCOUNTER — Ambulatory Visit: Payer: Medicare Other | Admitting: Physician Assistant

## 2023-07-05 ENCOUNTER — Ambulatory Visit (INDEPENDENT_AMBULATORY_CARE_PROVIDER_SITE_OTHER): Payer: Medicare Other | Admitting: Physician Assistant

## 2023-07-05 ENCOUNTER — Encounter: Payer: Self-pay | Admitting: Physician Assistant

## 2023-07-05 VITALS — BP 120/70 | HR 74 | Temp 98.5°F | Resp 16 | Ht 66.0 in | Wt 196.2 lb

## 2023-07-05 DIAGNOSIS — J4489 Other specified chronic obstructive pulmonary disease: Secondary | ICD-10-CM | POA: Diagnosis not present

## 2023-07-05 DIAGNOSIS — E559 Vitamin D deficiency, unspecified: Secondary | ICD-10-CM

## 2023-07-05 DIAGNOSIS — R300 Dysuria: Secondary | ICD-10-CM

## 2023-07-05 DIAGNOSIS — N390 Urinary tract infection, site not specified: Secondary | ICD-10-CM

## 2023-07-05 DIAGNOSIS — E538 Deficiency of other specified B group vitamins: Secondary | ICD-10-CM | POA: Diagnosis not present

## 2023-07-05 DIAGNOSIS — E1165 Type 2 diabetes mellitus with hyperglycemia: Secondary | ICD-10-CM | POA: Diagnosis not present

## 2023-07-05 LAB — POCT URINALYSIS DIPSTICK
Bilirubin, UA: NEGATIVE
Blood, UA: NEGATIVE
Glucose, UA: POSITIVE — AB
Ketones, UA: NEGATIVE
Nitrite, UA: NEGATIVE
Protein, UA: NEGATIVE
Spec Grav, UA: 1.01 (ref 1.010–1.025)
Urobilinogen, UA: 0.2 U/dL
pH, UA: 5 (ref 5.0–8.0)

## 2023-07-05 MED ORDER — CYANOCOBALAMIN 1000 MCG/ML IJ SOLN
1000.0000 ug | Freq: Once | INTRAMUSCULAR | Status: AC
  Administered 2023-07-05: 1000 ug via INTRAMUSCULAR

## 2023-07-05 MED ORDER — ERGOCALCIFEROL 1.25 MG (50000 UT) PO CAPS: ORAL_CAPSULE | ORAL | 3 refills | Status: DC

## 2023-07-05 NOTE — Progress Notes (Signed)
[facility] [address] [place], [place] [address]  Internal MEDICINE  Office Visit Note  Patient Name: Amber Holder  [id number]  [id number]  Date of Service: 07/05/2023  Chief Complaint  Patient presents with   Follow-up   Diabetes   Gastroesophageal Reflux   Shortness of Breath   Medication Problem    Metformin causing diarrhea, no longer taking    HPI Pt is here for routine follow up -Sees urology for recurrent UTI already, does feel some burning again now -Labs reviewed: Vit D borderline low but improved, cholesterol improving, B12 low, A1c 6.6 which is up but still controlled since stopping metformin. Will start b12 shots and Vit D supplement. -CT chest reviewed, already seeing cardiology and pulmonology  IMPRESSION: 1. Lung-RADS 1, negative. Continue annual screening with low-dose chest CT without contrast in 12 months. 2. Pulmonary artery enlargement suggests pulmonary arterial hypertension. 3. Hepatic steatosis. 4. Aortic Atherosclerosis (ICD10-I70.0) and Emphysema (ICD10-J43.9). Coronary artery atherosclerosis.  Current Medication: Outpatient Encounter Medications as of 07/05/2023  Medication Sig   acetaminophen (TYLENOL) 500 MG chewable tablet Chew 1,000 mg by mouth every 8 (eight) hours as needed for pain.   albuterol (VENTOLIN HFA) 108 (90 Base) MCG/ACT inhaler Inhale 2 puffs into the lungs every 6 (six) hours as needed for wheezing or shortness of breath.   aspirin EC 81 MG tablet Take 1 tablet (81 mg total) by mouth daily. Swallow whole.   atorvastatin (LIPITOR) 40 MG tablet Take 1 tablet (40 mg total) by mouth daily.   cholecalciferol (VITAMIN D) 400 UNITS TABS tablet Take 2,000 Units by mouth daily.   CRANBERRY PO Take 25,000 mg by mouth daily.   ergocalciferol (DRISDOL) 1.25 MG (50000 UT) capsule Take one cap q week   estradiol (ESTRACE) 0.1 MG/GM vaginal cream Estrogen Cream Instruction Discard applicator Apply pea sized amount to tip of  finger to urethra before bed. Wash hands well after application. Use Monday, Wednesday and Friday   FARXIGA 10 MG TABS tablet TAKE 1 TABLET BY MOUTH DAILY BEFORE BREAKFAST.   fluconazole (DIFLUCAN) 150 MG tablet Take one tab po q week for fungal infection and as needed   Fluticasone-Umeclidin-Vilant (TRELEGY ELLIPTA) 100-62.5-25 MCG/ACT AEPB USE 1 INHALATION ORALLY    DAILY.   glucose blood (ONETOUCH VERIO) test strip BLOOD SUGAR TESTING ONCE DAILY . DX E11.65   hydrocortisone cream 0.5 % Apply topically as needed.    hydrocortisone cream 0.5 % Apply topically.   ibandronate (BONIVA) 150 MG tablet TAKE 1 TABLET (150 MG TOTAL) BY MOUTH EVERY 30 (THIRTY) DAYS.   KLOR-CON M20 20 MEQ tablet TAKE 2 TABLETS EVERY MORNING AND 1 TABLET AT NIGHT AND THEN MAY TAKE ONE EXTRA PER DOCTOR DIRECTION. DOSE INCREASE   Lancets (ONETOUCH DELICA PLUS LANCET30G) MISC Use  as directed twice a daily DX E11.65   Magnesium 250 MG TABS Take by mouth. Takes 1 tablet 2-3 times per week   montelukast (SINGULAIR) 10 MG tablet Take 1 tablet (10 mg total) by mouth daily as needed.   Multiple Vitamins-Minerals (MULTIVITAMIN ADULTS PO) Take 1 tablet by mouth daily.   nitrofurantoin, macrocrystal-monohydrate, (MACROBID) 100 MG capsule Take 1 capsule (100 mg total) by mouth 2 (two) times daily.   nitrofurantoin, macrocrystal-monohydrate, (MACROBID) 100 MG capsule One tab po bid x 10 days and then MW/F once a day until finished   omeprazole (PRILOSEC) 40 MG capsule Take 1 capsule (40 mg total) by mouth daily. (Patient taking differently: Take 40 mg by mouth  as needed.)   OXYGEN Inhale 3 L into the lungs. Pt uses [facility] for Oxygen   predniSONE (DELTASONE) 10 MG tablet Take 1 tab po 3 x day for 3 days then take 1 tab po 2 x a day for 3 days and then take 1 tab po daily for 3 days   Simethicone 80 MG TABS Take 1 tablet (80 mg total) by mouth as needed (burping). As directed. (Patient taking differently: Take 1 tablet by mouth  as needed (burping).)   torsemide (DEMADEX) 20 MG tablet Take 1 tablet (20 mg total) by mouth daily. May take an extra 20 mg as needed for swelling   [DISCONTINUED] metFORMIN (GLUCOPHAGE-XR) 500 MG 24 hr tablet TAKE 1 TABLET DAILY WITH   SUPPER   [EXPIRED] cyanocobalamin (VITAMIN B12) injection 1,000 mcg    No facility-administered encounter medications on file as of 07/05/2023.    Surgical History: Past Surgical History:  Procedure Laterality Date   APPENDECTOMY     cataract surgery Bilateral    CHOLECYSTECTOMY     COLONOSCOPY     COLONOSCOPY WITH PROPOFOL N/A 05/28/2015   Procedure: COLONOSCOPY WITH PROPOFOL;  Surgeon: Christena Deem, MD;  Location: [facility] ENDOSCOPY;  Service: Endoscopy;  Laterality: N/A;   COLONOSCOPY WITH PROPOFOL N/A 10/28/2018   Procedure: COLONOSCOPY WITH PROPOFOL;  Surgeon: Christena Deem, MD;  Location: [facility] ENDOSCOPY;  Service: Endoscopy;  Laterality: N/A;   EYE SURGERY Bilateral 2015   Cataract Extraction with IOL   LITHOTRIPSY  2015   has had many stones   Mastoidotomy  1970   ROTATOR CUFF REPAIR Right 2011   TOTAL HIP ARTHROPLASTY Right 06/14/2016   Procedure: TOTAL HIP ARTHROPLASTY;  Surgeon: Donato Heinz, MD;  Location: [facility] ORS;  Service: Orthopedics;  Laterality: Right;    Medical History: Past Medical History:  Diagnosis Date   Anxiety 01/02/2012   Arthritis    Asthma    Blockage of coronary artery of heart (HCC)    Chronic kidney disease    Kidney stones   Colon polyps    COPD (chronic obstructive pulmonary disease) (HCC)    Diabetes mellitus without complication (HCC)    GERD (gastroesophageal reflux disease)    HOH (hard of hearing)    Bilateral hearing aids   Kidney stone    Osteoporosis    Sleep apnea    Does not use C-PAP on a regular basis   Venous stasis     Family History: Family History  Problem Relation Age of Onset   Breast cancer Maternal Aunt 50   Breast cancer Maternal Aunt    Bladder Cancer Neg Hx    Kidney  cancer Neg Hx     Social History   Socioeconomic History   Marital status: Divorced    Spouse name: Not on file   Number of children: Not on file   Years of education: Not on file   Highest education level: Not on file  Occupational History   Not on file  Tobacco Use   Smoking status: Former    Current packs/day: 0.00    Average packs/day: 1 pack/day for 50.0 years (50.0 ttl pk-yrs)    Types: Cigarettes    Start date: 12/29/1971    Quit date: 12/28/2021    Years since quitting: 1.5    Passive exposure: Never   Smokeless tobacco: Never   Tobacco comments:    1 pack daily//quit 12/28/2021  Vaping Use   Vaping status: Never Used  Substance and Sexual Activity   Alcohol use: Not Currently    Comment: very rarely    Drug use: No   Sexual activity: Not Currently    Birth control/protection: Post-menopausal  Other Topics Concern   Not on file  Social History Narrative   Not on file   Social Determinants of Health   Financial Resource Strain: Not on file  Food Insecurity: Not on file  Transportation Needs: Not on file  Physical Activity: Insufficiently Active (09/21/2022)   Exercise Vital Sign    Days of Exercise per Week: 3 days    Minutes of Exercise per Session: 30 min  Stress: Not on file  Social Connections: Not on file  Intimate Partner Violence: Not on file      Review of Systems  Constitutional:  Negative for chills, fatigue and unexpected weight change.  HENT:  Positive for postnasal drip. Negative for congestion, rhinorrhea, sneezing and sore throat.   Eyes:  Negative for redness.  Respiratory:  Negative for cough, chest tightness and shortness of breath.   Cardiovascular:  Negative for chest pain and palpitations.  Gastrointestinal:  Negative for abdominal pain, constipation, nausea and vomiting.  Genitourinary:  Negative for dysuria and frequency.  Musculoskeletal:  Positive for arthralgias. Negative for back pain, joint swelling and neck pain.  Skin:   Negative for rash.  Neurological: Negative.  Negative for tremors.  Hematological:  Negative for adenopathy. Does not bruise/bleed easily.  Psychiatric/Behavioral:  Negative for behavioral problems (Depression), sleep disturbance and suicidal ideas. The patient is not nervous/anxious.     Vital Signs: BP 120/70   Pulse 74   Temp 98.5 F (36.9 C)   Resp 16   Ht 5\' 6"  (1.676 m)   Wt 196 lb 3.2 oz (89 kg)   SpO2 93%   BMI 31.67 kg/m    Physical Exam Vitals and nursing note reviewed.  Constitutional:      Appearance: Normal appearance.  HENT:     Head: Normocephalic and atraumatic.  Eyes:     Extraocular Movements: Extraocular movements intact.  Cardiovascular:     Rate and Rhythm: Normal rate and regular rhythm.     Pulses: Normal pulses.     Heart sounds: Normal heart sounds.  Pulmonary:     Effort: Pulmonary effort is normal.     Breath sounds: Normal breath sounds.  Skin:    General: Skin is warm and dry.  Neurological:     General: No focal deficit present.     Mental Status: She is alert.  Psychiatric:        Mood and Affect: Mood normal.        Behavior: Behavior normal.        Assessment/Plan: 1. Type 2 diabetes mellitus with hyperglycemia, without long-term current use of insulin (HCC) A1c is 6.6, will continue [place] and monitor - Urine Microalbumin w/creat. ratio  2. Obstructive chronic bronchitis without exacerbation (HCC) Will schedule follow up with pulmonology, did show evidence of PAH and will follow up with cardiology as well  3. Vitamin D deficiency - ergocalciferol (DRISDOL) 1.25 MG (50000 UT) capsule; Take one cap q week  Dispense: 12 capsule; Refill: 3  4. B12 deficiency - cyanocobalamin (VITAMIN B12) injection 1,000 mcg  5. Urinary tract infection without hematuria, site unspecified - CULTURE, URINE COMPREHENSIVE  6. Dysuria - POCT Urinalysis Dipstick   General Counseling: Mindel verbalizes understanding of the findings of todays  visit and agrees with plan of treatment. I have discussed  any further diagnostic evaluation that may be needed or ordered today. We also reviewed her medications today. she has been encouraged to call the office with any questions or concerns that should arise related to todays visit.    Orders Placed This Encounter  Procedures   CULTURE, URINE COMPREHENSIVE   Urine Microalbumin w/creat. ratio   POCT Urinalysis Dipstick    Meds ordered this encounter  Medications   ergocalciferol (DRISDOL) 1.25 MG (50000 UT) capsule    Sig: Take one cap q week    Dispense:  12 capsule    Refill:  3   cyanocobalamin (VITAMIN B12) injection 1,000 mcg    This patient was seen by Lynn Ito, PA-C in collaboration with Dr. Beverely Risen as a part of collaborative care agreement.   Total time spent:30 Minutes Time spent includes review of chart, medications, test results, and follow up plan with the patient.      Dr Lyndon Code Internal medicine

## 2023-07-06 LAB — MICROALBUMIN / CREATININE URINE RATIO
Creatinine, Urine: 20.4 mg/dL
Microalb/Creat Ratio: <15 mg/g{creat} (ref 0–29)
Microalbumin, Urine: <3 ug/mL

## 2023-07-09 LAB — CULTURE, URINE COMPREHENSIVE

## 2023-07-12 ENCOUNTER — Ambulatory Visit (INDEPENDENT_AMBULATORY_CARE_PROVIDER_SITE_OTHER): Payer: Medicare Other

## 2023-07-12 ENCOUNTER — Other Ambulatory Visit: Payer: Self-pay

## 2023-07-12 DIAGNOSIS — N390 Urinary tract infection, site not specified: Secondary | ICD-10-CM

## 2023-07-12 DIAGNOSIS — E538 Deficiency of other specified B group vitamins: Secondary | ICD-10-CM

## 2023-07-12 MED ORDER — NITROFURANTOIN MONOHYD MACRO 100 MG PO CAPS: 100.0000 mg | ORAL_CAPSULE | Freq: Two times a day (BID) | ORAL | Status: DC

## 2023-07-12 MED ORDER — CYANOCOBALAMIN 1000 MCG/ML IJ SOLN
1000.0000 ug | Freq: Once | INTRAMUSCULAR | Status: AC
  Administered 2023-07-12: 1000 ug via INTRAMUSCULAR

## 2023-07-13 ENCOUNTER — Other Ambulatory Visit: Payer: Self-pay

## 2023-07-13 DIAGNOSIS — N390 Urinary tract infection, site not specified: Secondary | ICD-10-CM

## 2023-07-13 DIAGNOSIS — H905 Unspecified sensorineural hearing loss: Secondary | ICD-10-CM | POA: Diagnosis not present

## 2023-07-13 DIAGNOSIS — H6061 Unspecified chronic otitis externa, right ear: Secondary | ICD-10-CM | POA: Diagnosis not present

## 2023-07-13 DIAGNOSIS — Z23 Encounter for immunization: Secondary | ICD-10-CM | POA: Diagnosis not present

## 2023-07-13 DIAGNOSIS — J418 Mixed simple and mucopurulent chronic bronchitis: Secondary | ICD-10-CM

## 2023-07-13 DIAGNOSIS — F320 Major depressive disorder, single episode, mild: Secondary | ICD-10-CM

## 2023-07-13 DIAGNOSIS — J9611 Chronic respiratory failure with hypoxia: Secondary | ICD-10-CM

## 2023-07-13 DIAGNOSIS — H9203 Otalgia, bilateral: Secondary | ICD-10-CM | POA: Diagnosis not present

## 2023-07-13 DIAGNOSIS — E1165 Type 2 diabetes mellitus with hyperglycemia: Secondary | ICD-10-CM

## 2023-07-13 DIAGNOSIS — H6121 Impacted cerumen, right ear: Secondary | ICD-10-CM | POA: Diagnosis not present

## 2023-07-13 MED ORDER — NITROFURANTOIN MONOHYD MACRO 100 MG PO CAPS: 100.0000 mg | ORAL_CAPSULE | Freq: Two times a day (BID) | ORAL | 0 refills | Status: DC

## 2023-07-19 ENCOUNTER — Ambulatory Visit (INDEPENDENT_AMBULATORY_CARE_PROVIDER_SITE_OTHER): Payer: Medicare Other

## 2023-07-19 DIAGNOSIS — E538 Deficiency of other specified B group vitamins: Secondary | ICD-10-CM | POA: Diagnosis not present

## 2023-07-19 MED ORDER — CYANOCOBALAMIN 1000 MCG/ML IJ SOLN
1000.0000 ug | Freq: Once | INTRAMUSCULAR | Status: AC
  Administered 2023-07-19: 1000 ug via INTRAMUSCULAR

## 2023-07-22 ENCOUNTER — Other Ambulatory Visit: Payer: Self-pay | Admitting: Internal Medicine

## 2023-07-22 DIAGNOSIS — M810 Age-related osteoporosis without current pathological fracture: Secondary | ICD-10-CM

## 2023-07-22 NOTE — Telephone Encounter (Signed)
When is her pcp f/u

## 2023-07-23 ENCOUNTER — Ambulatory Visit: Payer: Medicare Other | Admitting: Internal Medicine

## 2023-07-23 ENCOUNTER — Encounter: Payer: Self-pay | Admitting: Internal Medicine

## 2023-07-23 VITALS — BP 115/70 | HR 74 | Temp 97.8°F | Resp 16 | Ht 66.0 in | Wt 196.0 lb

## 2023-07-23 DIAGNOSIS — I2721 Secondary pulmonary arterial hypertension: Secondary | ICD-10-CM

## 2023-07-23 DIAGNOSIS — J9611 Chronic respiratory failure with hypoxia: Secondary | ICD-10-CM

## 2023-07-23 DIAGNOSIS — R911 Solitary pulmonary nodule: Secondary | ICD-10-CM | POA: Diagnosis not present

## 2023-07-23 DIAGNOSIS — Z9981 Dependence on supplemental oxygen: Secondary | ICD-10-CM

## 2023-07-23 MED ORDER — MACITENTAN 10 MG PO TABS: 10.0000 mg | ORAL_TABLET | Freq: Every day | ORAL | 4 refills | Status: AC

## 2023-07-23 NOTE — Patient Instructions (Signed)
Pulmonary Hypertension Pulmonary hypertension is a condition that causes high blood pressure in the blood vessels of your lungs. This makes your heart work extra hard to pump blood to your lungs. And when your heart has to work harder, it can be harder for you to breathe. Over time, this can hurt and weaken your heart. What are the causes? This condition can be put into one of five groups based on what causes it. Group 1 happens when the blood vessels that carry blood to your lungs get too thick or stiff. This may happen with no known cause or it may be: Inherited. This means it's passed from parent to child. Caused by another disease, such as a disease of the heart or liver. Caused by some medicines or poisons. Group 2 happens if you have problems with the valves in your heart or if the left side of your heart, also called your left ventricle, gets weak. Group 3 can be caused by long-term diseases of the lungs, such as chronic obstructive pulmonary disease or COPD. This can also happen if you don't get enough oxygen, such as if you have trouble breathing when you sleep or if you live at a high altitude. Group 4 is caused by blood clots in your lungs. Group 5 includes other causes, such as sickle cell anemia or growths of cells that aren't normal called tumors. What are the signs or symptoms? Symptoms may include: Shortness of breath. A cough. Tiredness. Feeling dizzy or light-headed. You may also faint. A fast heart beat. You may also feel your heart flutter or skip a beat. Your lips or fingers turning blue. Chest pain or tightness. How is this diagnosed? This condition may be diagnosed with: Blood tests. Imaging tests. These may include: Chest X-rays. CT scans. An echocardiogram. This is an ultrasound of your heart. A ventilation-perfusion scan. This sees how well air and blood flow in and out of your lungs. A pulmonary function test. This looks at how much air your lungs can  hold. A 6-minute walk test. This may be done to check your breathing during exercise. Cardiac catheterization. This is a procedure that uses a soft tube called a catheter to check the arteries of your heart. A lung biopsy. This is when a small piece of tissue is removed from your lung for testing. How is this treated? There's no cure for this condition. But treatment can help you feel better and can slow the condition down. You may need: Oxygen therapy. Cardiac rehabilitation, or rehab. This is a program that teaches you how to: Care for your heart. Exercise. Go back to your normal activities. Medicines to: Lower your blood pressure. Relax the blood vessels in your lungs. Help your heart pump more blood. Help your body get rid of extra fluid. Thin your blood. This can stop you from getting blood clots. Lung surgery. This can relieve pressure on your heart. You may need this if other treatments don't work. Heart-lung or lung transplant. This may be done in very severe cases. Follow these instructions at home: Eating and drinking  Eat a healthy diet. Eat lots of fresh fruits and vegetables, whole grains, and beans. Limit how much salt you take in to less than 2,300 mg a day. Salt is also called sodium. Activity Get lots of rest. Exercise as told. Ask your health care provider what types of exercise are safe for you. Lifestyle Do not use any products that contain nicotine or tobacco. These products include cigarettes, chewing  tobacco, and vaping devices, such as e-cigarettes. If you need help quitting, ask your provider. Stay away when other people smoke. Do not sit in hot tubs or saunas for long stretches of time. Do not get pregnant. If needed, talk with your provider about birth control. Avoid high altitudes. It can be stressful to live with pulmonary hypertension. Talk with your provider about finding support groups and online help. General instructions Take over-the-counter and  prescription medicines only as told by your provider. Do not change or stop medicines without talking with your provider. Stay up to date on your shots, such as your yearly flu shot and pneumonia shot. Use oxygen therapy at home as told. Keep all follow-up visits. Your provider will check your breathing and make changes to your medicines as needed. Contact a health care provider if: Your cough gets worse. You have more shortness of breath. You start to have trouble doing things you could do before. You need to use more medicines or oxygen, or you need to use them more often than normal. Get help right away if: You have severe shortness of breath. You have chest pain or pressure. You cough up blood. These symptoms may be an emergency. Get help right away. Call 911. Do not wait to see if the symptoms will go away. Do not drive yourself to the hospital. This information is not intended to replace advice given to you by your health care provider. Make sure you discuss any questions you have with your health care provider. Document Revised: 10/30/2022 Document Reviewed: 10/30/2022 Elsevier Patient Education  2024 [employer].

## 2023-07-23 NOTE — Progress Notes (Signed)
[facility] [address] [place], [place] [address]  Pulmonary Sleep Medicine   Office Visit Note  Patient Name: Amber Holder DOB: 1945-10-05 MRN [id number]  Date of Service: 07/23/2023  Complaints/HPI: She had a CT chest done which shows benign appearance. She does have known PAH and she is on oxygen for this. She states she uses the oxygen continuously. She feels shortness of breath. Her last PFT was in 2023 shows normal FEV1 and low DLCO. She has not been asked previously about using opsumit. I will try to see if she can be approved  Office Spirometry Results:     ROS  General: (-) fever, (-) chills, (-) night sweats, (-) weakness Skin: (-) rashes, (-) itching,. Eyes: (-) visual changes, (-) redness, (-) itching. Nose and Sinuses: (-) nasal stuffiness or itchiness, (-) postnasal drip, (-) nosebleeds, (-) sinus trouble. Mouth and Throat: (-) sore throat, (-) hoarseness. Neck: (-) swollen glands, (-) enlarged thyroid, (-) neck pain. Respiratory: - cough, (-) bloody sputum, + shortness of breath, - wheezing. Cardiovascular: - ankle swelling, (-) chest pain. Lymphatic: (-) lymph node enlargement. Neurologic: (-) numbness, (-) tingling. Psychiatric: (-) anxiety, (-) depression   Current Medication: Outpatient Encounter Medications as of 07/23/2023  Medication Sig   acetaminophen (TYLENOL) 500 MG chewable tablet Chew 1,000 mg by mouth every 8 (eight) hours as needed for pain.   albuterol (VENTOLIN HFA) 108 (90 Base) MCG/ACT inhaler Inhale 2 puffs into the lungs every 6 (six) hours as needed for wheezing or shortness of breath.   aspirin EC 81 MG tablet Take 1 tablet (81 mg total) by mouth daily. Swallow whole.   atorvastatin (LIPITOR) 40 MG tablet Take 1 tablet (40 mg total) by mouth daily.   cholecalciferol (VITAMIN D) 400 UNITS TABS tablet Take 2,000 Units by mouth daily.   CRANBERRY PO Take 25,000 mg by mouth daily.   ergocalciferol (DRISDOL) 1.25 MG (50000  UT) capsule Take one cap q week   estradiol (ESTRACE) 0.1 MG/GM vaginal cream Estrogen Cream Instruction Discard applicator Apply pea sized amount to tip of finger to urethra before bed. Wash hands well after application. Use Monday, Wednesday and Friday   FARXIGA 10 MG TABS tablet TAKE 1 TABLET BY MOUTH DAILY BEFORE BREAKFAST.   fluconazole (DIFLUCAN) 150 MG tablet Take one tab po q week for fungal infection and as needed   Fluticasone-Umeclidin-Vilant (TRELEGY ELLIPTA) 100-62.5-25 MCG/ACT AEPB USE 1 INHALATION ORALLY    DAILY.   glucose blood (ONETOUCH VERIO) test strip BLOOD SUGAR TESTING ONCE DAILY . DX E11.65   hydrocortisone cream 0.5 % Apply topically as needed.    hydrocortisone cream 0.5 % Apply topically.   ibandronate (BONIVA) 150 MG tablet TAKE 1 TABLET (150 MG TOTAL) BY MOUTH EVERY 30 (THIRTY) DAYS.   KLOR-CON M20 20 MEQ tablet TAKE 2 TABLETS EVERY MORNING AND 1 TABLET AT NIGHT AND THEN MAY TAKE ONE EXTRA PER DOCTOR DIRECTION. DOSE INCREASE   Lancets (ONETOUCH DELICA PLUS LANCET30G) MISC Use  as directed twice a daily DX E11.65   Magnesium 250 MG TABS Take by mouth. Takes 1 tablet 2-3 times per week   montelukast (SINGULAIR) 10 MG tablet Take 1 tablet (10 mg total) by mouth daily as needed.   Multiple Vitamins-Minerals (MULTIVITAMIN ADULTS PO) Take 1 tablet by mouth daily.   nitrofurantoin, macrocrystal-monohydrate, (MACROBID) 100 MG capsule One tab po bid x 10 days and then MW/F once a day until finished   nitrofurantoin, macrocrystal-monohydrate, (MACROBID) 100 MG capsule Take  1 capsule (100 mg total) by mouth 2 (two) times daily.   omeprazole (PRILOSEC) 40 MG capsule Take 1 capsule (40 mg total) by mouth daily. (Patient taking differently: Take 40 mg by mouth as needed.)   OXYGEN Inhale 3 L into the lungs. Pt uses [facility] for Oxygen   predniSONE (DELTASONE) 10 MG tablet Take 1 tab po 3 x day for 3 days then take 1 tab po 2 x a day for 3 days and then take 1 tab po daily for  3 days   Simethicone 80 MG TABS Take 1 tablet (80 mg total) by mouth as needed (burping). As directed. (Patient taking differently: Take 1 tablet by mouth as needed (burping).)   torsemide (DEMADEX) 20 MG tablet Take 1 tablet (20 mg total) by mouth daily. May take an extra 20 mg as needed for swelling   Facility-Administered Encounter Medications as of 07/23/2023  Medication   nitrofurantoin (macrocrystal-monohydrate) (MACROBID) capsule 100 mg    Surgical History: Past Surgical History:  Procedure Laterality Date   APPENDECTOMY     cataract surgery Bilateral    CHOLECYSTECTOMY     COLONOSCOPY     COLONOSCOPY WITH PROPOFOL N/A 05/28/2015   Procedure: COLONOSCOPY WITH PROPOFOL;  Surgeon: Christena Deem, MD;  Location: [facility] ENDOSCOPY;  Service: Endoscopy;  Laterality: N/A;   COLONOSCOPY WITH PROPOFOL N/A 10/28/2018   Procedure: COLONOSCOPY WITH PROPOFOL;  Surgeon: Christena Deem, MD;  Location: [facility] ENDOSCOPY;  Service: Endoscopy;  Laterality: N/A;   EYE SURGERY Bilateral 2015   Cataract Extraction with IOL   LITHOTRIPSY  2015   has had many stones   Mastoidotomy  1970   ROTATOR CUFF REPAIR Right 2011   TOTAL HIP ARTHROPLASTY Right 06/14/2016   Procedure: TOTAL HIP ARTHROPLASTY;  Surgeon: Donato Heinz, MD;  Location: [facility] ORS;  Service: Orthopedics;  Laterality: Right;    Medical History: Past Medical History:  Diagnosis Date   Anxiety 01/02/2012   Arthritis    Asthma    Blockage of coronary artery of heart (HCC)    Chronic kidney disease    Kidney stones   Colon polyps    COPD (chronic obstructive pulmonary disease) (HCC)    Diabetes mellitus without complication (HCC)    GERD (gastroesophageal reflux disease)    HOH (hard of hearing)    Bilateral hearing aids   Kidney stone    Osteoporosis    Sleep apnea    Does not use C-PAP on a regular basis   Venous stasis     Family History: Family History  Problem Relation Age of Onset   Breast cancer Maternal Aunt 50    Breast cancer Maternal Aunt    Bladder Cancer Neg Hx    Kidney cancer Neg Hx     Social History: Social History   Socioeconomic History   Marital status: Divorced    Spouse name: Not on file   Number of children: Not on file   Years of education: Not on file   Highest education level: Not on file  Occupational History   Not on file  Tobacco Use   Smoking status: Former    Current packs/day: 0.00    Average packs/day: 1 pack/day for 50.0 years (50.0 ttl pk-yrs)    Types: Cigarettes    Start date: 12/29/1971    Quit date: 12/28/2021    Years since quitting: 1.5    Passive exposure: Never   Smokeless tobacco: Never  Vaping Use  Vaping status: Never Used  Substance and Sexual Activity   Alcohol use: Not Currently    Comment: very rarely    Drug use: No   Sexual activity: Not Currently    Birth control/protection: Post-menopausal  Other Topics Concern   Not on file  Social History Narrative   Not on file   Social Determinants of Health   Financial Resource Strain: Not on file  Food Insecurity: Not on file  Transportation Needs: Not on file  Physical Activity: Insufficiently Active (09/21/2022)   Exercise Vital Sign    Days of Exercise per Week: 3 days    Minutes of Exercise per Session: 30 min  Stress: Not on file  Social Connections: Not on file  Intimate Partner Violence: Not on file    Vital Signs: Blood pressure 115/70, pulse 74, temperature 97.8 F (36.6 C), resp. rate 16, height 5\' 6"  (1.676 m), weight 196 lb (88.9 kg), SpO2 92%.  Examination: General Appearance: The patient is well-developed, well-nourished, and in no distress. Skin: Gross inspection of skin unremarkable. Head: normocephalic, no gross deformities. Eyes: no gross deformities noted. ENT: ears appear grossly normal no exudates. Neck: Supple. No thyromegaly. No LAD. Respiratory: no rhonchi noted. Cardiovascular: Normal S1 and S2 without murmur or rub. Extremities: No cyanosis. pulses are  equal. Neurologic: Alert and oriented. No involuntary movements.  LABS: Recent Results (from the past 2160 hour(s))  Urinalysis, Complete     Status: Abnormal   Collection Time: 05/31/23  2:09 PM  Result Value Ref Range   Specific Gravity, UA 1.015 1.005 - 1.030   pH, UA 6.0 5.0 - 7.5   Color, UA Yellow Yellow   Appearance Ur Clear Clear   Leukocytes,UA Negative Negative   Protein,UA Negative Negative/Trace   Glucose, UA 3+ (A) Negative   Ketones, UA Negative Negative   RBC, UA Trace (A) Negative   Bilirubin, UA Negative Negative   Urobilinogen, Ur 0.2 0.2 - 1.0 mg/dL   Nitrite, UA Negative Negative   Microscopic Examination See below:   CULTURE, URINE COMPREHENSIVE     Status: Abnormal   Collection Time: 05/31/23  2:09 PM   Specimen: Urine   UR  Result Value Ref Range   Urine Culture, Comprehensive Final report (A)    Organism ID, Bacteria Comment (A)     Comment: Escherichia coli, identified by an automated biochemical system. Cefazolin <=4 ug/mL Cefazolin with an MIC <=16 predicts susceptibility to the oral agents cefaclor, cefdinir, cefpodoxime, cefprozil, cefuroxime, cephalexin, and loracarbef when used for therapy of uncomplicated urinary tract infections due to E. coli, Klebsiella pneumoniae, and Proteus mirabilis. 3,000 Colonies/mL    ANTIMICROBIAL SUSCEPTIBILITY Comment     Comment:       ** S = Susceptible; I = Intermediate; R = Resistant **                    P = Positive; N = Negative             MICS are expressed in micrograms per mL    Antibiotic                 RSLT#1    RSLT#2    RSLT#3    RSLT#4 Amoxicillin/Clavulanic Acid    S Ampicillin                     S Cefepime  S Ceftriaxone                    S Cefuroxime                     S Ciprofloxacin                  S Ertapenem                      S Gentamicin                     S Imipenem                       S Levofloxacin                   S Meropenem                       S Nitrofurantoin                 S Piperacillin/Tazobactam        S Tetracycline                   S Tobramycin                     S Trimethoprim/Sulfa             S   Microscopic Examination     Status: None   Collection Time: 05/31/23  2:09 PM   Urine  Result Value Ref Range   WBC, UA 0-5 0 - 5 /hpf   RBC, Urine 0-2 0 - 2 /hpf   Epithelial Cells (non renal) 0-10 0 - 10 /hpf   Bacteria, UA Few None seen/Few  Comprehensive metabolic panel     Status: Abnormal   Collection Time: 06/11/23 10:32 AM  Result Value Ref Range   Sodium 138 135 - 145 mmol/L   Potassium 4.3 3.5 - 5.1 mmol/L   Chloride 105 98 - 111 mmol/L   CO2 25 22 - 32 mmol/L   Glucose, Bld 115 (H) 70 - 99 mg/dL    Comment: Glucose reference range applies only to samples taken after fasting for at least 8 hours.   BUN 23 8 - 23 mg/dL   Creatinine, Ser 9.60 0.44 - 1.00 mg/dL   Calcium 9.0 8.9 - 45.4 mg/dL   Total Protein 7.1 6.5 - 8.1 g/dL   Albumin 4.1 3.5 - 5.0 g/dL   AST 19 15 - 41 U/L   ALT 19 0 - 44 U/L   Alkaline Phosphatase 57 38 - 126 U/L   Total Bilirubin 0.6 0.3 - 1.2 mg/dL   GFR, Estimated >09 >81 mL/min    Comment: (NOTE) Calculated using the CKD-EPI Creatinine Equation (2021)    Anion gap 8 5 - 15    Comment: Performed at [facility], [address], [place], [place] [address]  Lipid panel     Status: None   Collection Time: 06/11/23 10:32 AM  Result Value Ref Range   Cholesterol 169 0 - 200 mg/dL   Triglycerides 79 <829 mg/dL   HDL 57 >56 mg/dL   Total CHOL/HDL Ratio 3.0 RATIO   VLDL 16 0 - 40 mg/dL   LDL Cholesterol 96 0 - 99 mg/dL    Comment:  Total Cholesterol/HDL:CHD Risk Coronary Heart Disease Risk Table                     Men   Women  1/2 Average Risk   3.4   3.3  Average Risk       5.0   4.4  2 X Average Risk   9.6   7.1  3 X Average Risk  23.4   11.0        Use the calculated Patient Ratio above and the CHD Risk Table to determine the patient's CHD Risk.         ATP III CLASSIFICATION (LDL):  <100     mg/dL   Optimal  962-952  mg/dL   Near or Above                    Optimal  130-159  mg/dL   Borderline  841-324  mg/dL   High  >401     mg/dL   Very High Performed at [facility], [address] [place], [place] [address]   CBC with Differential/Platelet     Status: None   Collection Time: 06/11/23 10:32 AM  Result Value Ref Range   WBC 6.3 4.0 - 10.5 K/uL   RBC 4.96 3.87 - 5.11 MIL/uL   Hemoglobin 14.5 12.0 - 15.0 g/dL   HCT 36.6 44.0 - 34.7 %   MCV 90.1 80.0 - 100.0 fL   MCH 29.2 26.0 - 34.0 pg   MCHC 32.4 30.0 - 36.0 g/dL   RDW 42.5 95.6 - 38.7 %   Platelets 214 150 - 400 K/uL   nRBC 0.0 0.0 - 0.2 %   Neutrophils Relative % 37 %   Neutro Abs 2.3 1.7 - 7.7 K/uL   Lymphocytes Relative 53 %   Lymphs Abs 3.3 0.7 - 4.0 K/uL   Monocytes Relative 8 %   Monocytes Absolute 0.5 0.1 - 1.0 K/uL   Eosinophils Relative 1 %   Eosinophils Absolute 0.1 0.0 - 0.5 K/uL   Basophils Relative 1 %   Basophils Absolute 0.0 0.0 - 0.1 K/uL   Immature Granulocytes 0 %   Abs Immature Granulocytes 0.01 0.00 - 0.07 K/uL    Comment: Performed at [facility], [address] [place], [place] [address]  LDL cholesterol, direct     Status: Abnormal   Collection Time: 06/11/23 10:32 AM  Result Value Ref Range   Direct LDL 100 (H) 0 - 99 mg/dL    Comment: Performed at [facility], [address], [place], [place] [address]  Vitamin B12     Status: None   Collection Time: 06/11/23 10:32 AM  Result Value Ref Range   Vitamin B-12 269 180 - 914 pg/mL    Comment: (NOTE) This assay is not validated for testing neonatal or myeloproliferative syndrome specimens for Vitamin B12 levels. Performed at [facility], [address] [place], [place] [address]   Folate     Status: None   Collection Time: 06/11/23 10:32 AM  Result Value Ref Range   Folate 27.0 >5.9 ng/mL    Comment: Performed at [facility], [address] [place], [place] [address]  Hemoglobin A1c     Status: Abnormal   Collection Time: 06/11/23 10:32 AM  Result Value Ref Range   Hgb A1c MFr Bld 6.6 (H) 4.8 - 5.6 %    Comment: (NOTE) Pre diabetes:  5.7%-6.4%  Diabetes:              >6.4%  Glycemic control for   <7.0% adults with diabetes    Mean Plasma Glucose 142.72 mg/dL    Comment: Performed at [facility], [address], [place], [place] [address]  T4, free     Status: None   Collection Time: 06/11/23 10:32 AM  Result Value Ref Range   Free T4 0.80 0.61 - 1.12 ng/dL    Comment: (NOTE) Biotin ingestion may interfere with free T4 tests. If the results are inconsistent with the TSH level, previous test results, or the clinical presentation, then consider biotin interference. If needed, order repeat testing after stopping biotin. Performed at [facility], [address] [place], [place] [address]   TSH     Status: None   Collection Time: 06/11/23 10:32 AM  Result Value Ref Range   TSH 1.797 0.350 - 4.500 uIU/mL    Comment: Performed by a 3rd Generation assay with a functional sensitivity of <=0.01 uIU/mL. Performed at [facility], [address] [place], [place] [address]   VITAMIN D 25 Hydroxy (Vit-D Deficiency, Fractures)     Status: None   Collection Time: 06/11/23 10:32 AM  Result Value Ref Range   Vit D, 25-Hydroxy 32.94 30 - 100 ng/mL    Comment: (NOTE) Vitamin D deficiency has been defined by the Institute of Medicine  and an Endocrine Society practice guideline as a level of serum 25-OH  vitamin D less than 20 ng/mL (1,2). The Endocrine Society went on to  further define vitamin D insufficiency as a level between 21 and 29  ng/mL (2).  1. IOM (Institute of Medicine). 2010. Dietary reference intakes for  calcium and D. [place]: [employer]. 2. Holick MF, Binkley [place], Bischoff-Ferrari HA, et al. Evaluation,  treatment, and prevention of vitamin D  deficiency: an Endocrine  Society clinical practice guideline, JCEM. 2011 Jul; 96(7): 1911-30.  Performed at [facility], [address], [place], [place] [address]   POCT Urinalysis Dipstick     Status: Abnormal   Collection Time: 07/05/23 11:17 AM  Result Value Ref Range   Color, UA     Clarity, UA     Glucose, UA Positive (A) Negative   Bilirubin, UA Negative    Ketones, UA Negative    Spec Grav, UA 1.010 1.010 - 1.025   Blood, UA Negative    pH, UA 5.0 5.0 - 8.0   Protein, UA Negative Negative   Urobilinogen, UA 0.2 0.2 or 1.0 E.U./dL   Nitrite, UA Negative    Leukocytes, UA Large (3+) (A) Negative   Appearance     Odor    Urine Microalbumin w/creat. ratio     Status: None   Collection Time: 07/05/23  3:42 PM  Result Value Ref Range   Creatinine, Urine 20.4 Not Estab. mg/dL   Microalbumin, Urine <9.7 Not Estab. ug/mL   Microalb/Creat Ratio <15 0 - 29 mg/g creat    Comment:                        Normal:                0 -  29                        Moderately increased: 30 - 300  Severely increased:       >300   CULTURE, URINE COMPREHENSIVE     Status: Abnormal   Collection Time: 07/05/23  3:42 PM   Specimen: Urine   Urine  Result Value Ref Range   Urine Culture, Comprehensive Final report (A)    Organism ID, Bacteria Escherichia coli (A)     Comment: Cefazolin <=4 ug/mL Cefazolin with an MIC <=16 predicts susceptibility to the oral agents cefaclor, cefdinir, cefpodoxime, cefprozil, cefuroxime, cephalexin, and loracarbef when used for therapy of uncomplicated urinary tract infections due to E. coli, Klebsiella pneumoniae, and Proteus mirabilis. 25,000-50,000 colony forming units per mL    ANTIMICROBIAL SUSCEPTIBILITY Comment     Comment:       ** S = Susceptible; I = Intermediate; R = Resistant **                    P = Positive; N = Negative             MICS are expressed in micrograms per mL    Antibiotic                 RSLT#1     RSLT#2    RSLT#3    RSLT#4 Amoxicillin/Clavulanic Acid    S Ampicillin                     S Cefepime                       S Ceftriaxone                    S Cefuroxime                     S Ciprofloxacin                  S Ertapenem                      S Gentamicin                     S Imipenem                       S Levofloxacin                   S Meropenem                      S Nitrofurantoin                 S Piperacillin/Tazobactam        S Tetracycline                   S Tobramycin                     S Trimethoprim/Sulfa             S     Radiology: CT CHEST LUNG CA SCREEN LOW DOSE W/O CM  Result Date: 07/05/2023 CLINICAL DATA:  50 pack-year smoking history/quit last year EXAM: CT CHEST WITHOUT CONTRAST LOW-DOSE FOR LUNG CANCER SCREENING TECHNIQUE: Multidetector CT imaging of the chest was performed following the standard protocol without IV contrast. RADIATION DOSE REDUCTION: This exam was performed according to the departmental dose-optimization program which includes automated exposure control, adjustment of the mA and/or kV  according to patient size and/or use of iterative reconstruction technique. COMPARISON:  12/20/2021 diagnostic CT. FINDINGS: Cardiovascular: Aortic atherosclerosis. Tortuous thoracic aorta. Mild cardiomegaly, without pericardial effusion. Three vessel coronary artery calcification. Pulmonary artery enlargement, outflow tract 3.4 cm. Mediastinum/Nodes: No mediastinal or hilar adenopathy, given limitations of unenhanced CT. Lungs/Pleura: No pleural fluid. Moderate centrilobular emphysema. Right lower lobe calcified granuloma of 4.3 mm. Upper Abdomen: Mild hepatic steatosis. Normal imaged portions of the spleen, stomach, pancreas, adrenal glands, left kidney. Musculoskeletal: Right rotator cuff repair. Mild osteopenia. Accentuation of expected thoracic kyphosis. IMPRESSION: 1. Lung-RADS 1, negative. Continue annual screening with low-dose chest CT without  contrast in 12 months. 2. Pulmonary artery enlargement suggests pulmonary arterial hypertension. 3. Hepatic steatosis. 4. Aortic Atherosclerosis (ICD10-I70.0) and Emphysema (ICD10-J43.9). Coronary artery atherosclerosis. Electronically Signed   By: Jeronimo Greaves M.D.   On: 07/05/2023 10:18    No results found.  No results found.  Assessment and Plan: Patient Active Problem List   Diagnosis Date Noted   Varicose veins of leg with swelling, bilateral 06/09/2022   Hypokalemia 09/12/2020   Chronic venous stasis 01/11/2020   Atopic dermatitis 01/11/2020   Diastolic dysfunction 09/30/2019   Peripheral edema 09/10/2019   Dyspnea on exertion 09/10/2019   Muscle cramps 09/10/2019   Type 2 diabetes mellitus with hyperglycemia (HCC) 05/18/2019   Seasonal allergic rhinitis 05/18/2019   Screening for breast cancer 05/18/2019   Stenosis of left carotid artery 02/05/2019   Acute upper respiratory infection 10/09/2018   Flu-like symptoms 10/09/2018   Sore throat 10/09/2018   Vitamin D deficiency 07/27/2018   Need for vaccination against Streptococcus pneumoniae using pneumococcal conjugate vaccine 7 07/27/2018   Encounter for general adult medical examination with abnormal findings 04/07/2018   Primary generalized (osteo)arthritis 04/07/2018   Hematuria, microscopic 12/18/2017   Renal colic 12/18/2017   Urinary tract infection without hematuria 11/21/2017   Dysuria 11/21/2017   Uncontrolled type 2 diabetes mellitus with hyperglycemia (HCC) 11/21/2017   Diabetes mellitus type 2, uncomplicated (HCC) 10/04/2017   Osteoporosis 10/04/2017   S/P total hip arthroplasty 06/14/2016   Inguinal hernia, left 06/09/2013   Umbilical hernia 06/09/2013   Increased frequency of urination 11/27/2012   Chronic cystitis 11/19/2012   Incomplete emptying of bladder 11/19/2012   Medullary sponge kidney 11/19/2012   Mixed urge and stress incontinence 11/19/2012   Anxiety 01/02/2012   Calculus of kidney  01/02/2012   Chronic obstructive pulmonary disease (HCC) 01/02/2012   GERD (gastroesophageal reflux disease) 01/02/2012   Hearing loss 01/02/2012   Mixed hyperlipidemia 01/02/2012   Obesity, unspecified 01/02/2012   OSA on CPAP 01/02/2012   Tobacco abuse 01/02/2012   Venous insufficiency 01/02/2012    1. Chronic hypoxemic respiratory failure (HCC)  She needs to continue with oxygen therapy.  2. PAH (pulmonary artery hypertension) (HCC)   She does have pulmonary hypertension noted on the on the investigations.  I am going to try to see about getting her started on Opsumit.  We discussed at length potential side effects and how to take medication. - macitentan (OPSUMIT) 10 MG tablet; Take 1 tablet (10 mg total) by mouth daily.  Dispense: 30 tablet; Refill: 4  3. Oxygen dependent   On oxygen this will be continued  4. Pulmonary nodule  Monitor with follow-up CT scans.  She is not a candidate for any kind of intervention at this time   General Counseling: I have discussed the findings of the evaluation and examination with Brytani.  I have also discussed any further  diagnostic evaluation thatmay be needed or ordered today. Gwyneth verbalizes understanding of the findings of todays visit. We also reviewed her medications today and discussed drug interactions and side effects including but not limited excessive drowsiness and altered mental states. We also discussed that there is always a risk not just to her but also people around her. she has been encouraged to call the office with any questions or concerns that should arise related to todays visit.  No orders of the defined types were placed in this encounter.    Time spent: 53  I have personally obtained a history, examined the patient, evaluated laboratory and imaging results, formulated the assessment and plan and placed orders.    Yevonne Pax, MD [facility] Pulmonary and Critical Care Sleep medicine

## 2023-07-30 ENCOUNTER — Telehealth: Payer: Self-pay

## 2023-07-30 NOTE — Telephone Encounter (Signed)
Faxed patient's enrollment and prescription form to [facility] for Opsumit 10mg .

## 2023-08-16 ENCOUNTER — Ambulatory Visit: Payer: Medicare Other

## 2023-08-16 ENCOUNTER — Ambulatory Visit: Payer: Medicare Other | Admitting: Physician Assistant

## 2023-08-16 ENCOUNTER — Encounter: Payer: Self-pay | Admitting: Physician Assistant

## 2023-08-16 VITALS — BP 121/64 | HR 86 | Temp 98.6°F | Resp 16 | Ht 66.0 in | Wt 196.0 lb

## 2023-08-16 DIAGNOSIS — J9611 Chronic respiratory failure with hypoxia: Secondary | ICD-10-CM

## 2023-08-16 DIAGNOSIS — I2721 Secondary pulmonary arterial hypertension: Secondary | ICD-10-CM

## 2023-08-16 DIAGNOSIS — Z9981 Dependence on supplemental oxygen: Secondary | ICD-10-CM

## 2023-08-16 DIAGNOSIS — E538 Deficiency of other specified B group vitamins: Secondary | ICD-10-CM | POA: Diagnosis not present

## 2023-08-16 MED ORDER — HYDROCORTISONE 0.5 % EX CREA: TOPICAL_CREAM | CUTANEOUS | 0 refills | Status: AC | PRN

## 2023-08-16 MED ORDER — CYANOCOBALAMIN 1000 MCG/ML IJ SOLN
1000.0000 ug | Freq: Once | INTRAMUSCULAR | Status: AC
  Administered 2023-08-16: 1000 ug via INTRAMUSCULAR

## 2023-08-16 MED ORDER — DICLOFENAC SODIUM 1 % EX GEL: CUTANEOUS | 0 refills | Status: AC

## 2023-08-16 NOTE — Progress Notes (Signed)
[facility] [address] [place], [place] [address]  Pulmonary Sleep Medicine   Office Visit Note  Patient Name: Amber Holder DOB: [birth year] MRN [id number]  Date of Service: 08/16/2023  Complaints/HPI: pt is here for routine pulmonary follow up. She was prescribed Opsumit for PAH by Dr. Welton Flakes last visit, but unfortunately she has not gotten this yet. States she had to submit another form last week and is not sure if they even received it. States she can't get anyone on the phone. Will try to follow up on this, but stated it can take time to process for approval. Continues to wear her oxygen. States she hurt her back. Does request some refills of topical medications--voltaren and hydrocortisone that she uses prn.  ROS  General: (-) fever, (-) chills, (-) night sweats, (-) weakness Skin: (-) rashes, (-) itching,. Eyes: (-) visual changes, (-) redness, (-) itching. Nose and Sinuses: (-) nasal stuffiness or itchiness, (-) postnasal drip, (-) nosebleeds, (-) sinus trouble. Mouth and Throat: (-) sore throat, (-) hoarseness. Neck: (-) swollen glands, (-) enlarged thyroid, (-) neck pain. Respiratory: - cough, (-) bloody sputum, + shortness of breath, + wheezing. Cardiovascular: - ankle swelling, (-) chest pain. Lymphatic: (-) lymph node enlargement. Neurologic: (-) numbness, (-) tingling. Psychiatric: (-) anxiety, (-) depression   Current Medication: Outpatient Encounter Medications as of 08/16/2023  Medication Sig   acetaminophen (TYLENOL) 500 MG chewable tablet Chew 1,000 mg by mouth every 8 (eight) hours as needed for pain.   albuterol (VENTOLIN HFA) 108 (90 Base) MCG/ACT inhaler Inhale 2 puffs into the lungs every 6 (six) hours as needed for wheezing or shortness of breath.   aspirin EC 81 MG tablet Take 1 tablet (81 mg total) by mouth daily. Swallow whole.   atorvastatin (LIPITOR) 40 MG tablet Take 1 tablet (40 mg total) by mouth daily.   cholecalciferol (VITAMIN D)  400 UNITS TABS tablet Take 2,000 Units by mouth daily.   CRANBERRY PO Take 25,000 mg by mouth daily.   diclofenac Sodium (VOLTAREN) 1 % GEL Apply small amount to area of pain daily as needed   ergocalciferol (DRISDOL) 1.25 MG (50000 UT) capsule Take one cap q week   estradiol (ESTRACE) 0.1 MG/GM vaginal cream Estrogen Cream Instruction Discard applicator Apply pea sized amount to tip of finger to urethra before bed. Wash hands well after application. Use Monday, Wednesday and Friday   FARXIGA 10 MG TABS tablet TAKE 1 TABLET BY MOUTH DAILY BEFORE BREAKFAST.   fluconazole (DIFLUCAN) 150 MG tablet Take one tab po q week for fungal infection and as needed   Fluticasone-Umeclidin-Vilant (TRELEGY ELLIPTA) 100-62.5-25 MCG/ACT AEPB USE 1 INHALATION ORALLY    DAILY.   glucose blood (ONETOUCH VERIO) test strip BLOOD SUGAR TESTING ONCE DAILY . DX E11.65   hydrocortisone cream 0.5 % Apply topically.   ibandronate (BONIVA) 150 MG tablet TAKE 1 TABLET (150 MG TOTAL) BY MOUTH EVERY 30 (THIRTY) DAYS.   KLOR-CON M20 20 MEQ tablet TAKE 2 TABLETS EVERY MORNING AND 1 TABLET AT NIGHT AND THEN MAY TAKE ONE EXTRA PER DOCTOR DIRECTION. DOSE INCREASE   Lancets (ONETOUCH DELICA PLUS LANCET30G) MISC Use  as directed twice a daily DX E11.65   macitentan (OPSUMIT) 10 MG tablet Take 1 tablet (10 mg total) by mouth daily.   Magnesium 250 MG TABS Take by mouth. Takes 1 tablet 2-3 times per week   montelukast (SINGULAIR) 10 MG tablet Take 1 tablet (10 mg total) by mouth daily as needed.  Multiple Vitamins-Minerals (MULTIVITAMIN ADULTS PO) Take 1 tablet by mouth daily.   nitrofurantoin, macrocrystal-monohydrate, (MACROBID) 100 MG capsule One tab po bid x 10 days and then MW/F once a day until finished   nitrofurantoin, macrocrystal-monohydrate, (MACROBID) 100 MG capsule Take 1 capsule (100 mg total) by mouth 2 (two) times daily.   omeprazole (PRILOSEC) 40 MG capsule Take 1 capsule (40 mg total) by mouth daily. (Patient taking  differently: Take 40 mg by mouth as needed.)   OXYGEN Inhale 3 L into the lungs. Pt uses [facility] for Oxygen   predniSONE (DELTASONE) 10 MG tablet Take 1 tab po 3 x day for 3 days then take 1 tab po 2 x a day for 3 days and then take 1 tab po daily for 3 days   Simethicone 80 MG TABS Take 1 tablet (80 mg total) by mouth as needed (burping). As directed. (Patient taking differently: Take 1 tablet by mouth as needed (burping).)   torsemide (DEMADEX) 20 MG tablet Take 1 tablet (20 mg total) by mouth daily. May take an extra 20 mg as needed for swelling   [DISCONTINUED] hydrocortisone cream 0.5 % Apply topically as needed.    hydrocortisone cream 0.5 % Apply topically as needed.   Facility-Administered Encounter Medications as of 08/16/2023  Medication   nitrofurantoin (macrocrystal-monohydrate) (MACROBID) capsule 100 mg    Surgical History: Past Surgical History:  Procedure Laterality Date   APPENDECTOMY     cataract surgery Bilateral    CHOLECYSTECTOMY     COLONOSCOPY     COLONOSCOPY WITH PROPOFOL N/A 05/28/2015   Procedure: COLONOSCOPY WITH PROPOFOL;  Surgeon: Christena Deem, MD;  Location[facility] ENDOSCOPY;  Service: Endoscopy;  Laterality: N/A;   COLONOSCOPY WITH PROPOFOL N/A 10/28/2018   Procedure: COLONOSCOPY WITH PROPOFOL;  Surgeon: Christena Deem, MD;  Location: [facility] ENDOSCOPY;  Service: Endoscopy;  Laterality: N/A;   EYE SURGERY Bilateral 2015   Cataract Extraction with IOL   LITHOTRIPSY  2015   has had many stones   Mastoidotomy  1970   ROTATOR CUFF REPAIR Right 2011   TOTAL HIP ARTHROPLASTY Right 06/14/2016   Procedure: TOTAL HIP ARTHROPLASTY;  Surgeon: Donato Heinz, MD;  Location: [facility] ORS;  Service: Orthopedics;  Laterality: Right;    Medical History: Past Medical History:  Diagnosis Date   Anxiety 01/02/2012   Arthritis    Asthma    Blockage of coronary artery of heart (HCC)    Chronic kidney disease    Kidney stones   Colon polyps    COPD (chronic  obstructive pulmonary disease) (HCC)    Diabetes mellitus without complication (HCC)    GERD (gastroesophageal reflux disease)    HOH (hard of hearing)    Bilateral hearing aids   Kidney stone    Osteoporosis    Sleep apnea    Does not use C-PAP on a regular basis   Venous stasis     Family History: Family History  Problem Relation Age of Onset   Breast cancer Maternal Aunt 50   Breast cancer Maternal Aunt    Bladder Cancer Neg Hx    Kidney cancer Neg Hx     Social History: Social History   Socioeconomic History   Marital status: Divorced    Spouse name: Not on file   Number of children: Not on file   Years of education: Not on file   Highest education level: Not on file  Occupational History   Not on file  Tobacco  Use   Smoking status: Former    Current packs/day: 0.00    Average packs/day: 1 pack/day for 50.0 years (50.0 ttl pk-yrs)    Types: Cigarettes    Start date: 12/29/1971    Quit date: 12/28/2021    Years since quitting: 1.6    Passive exposure: Never   Smokeless tobacco: Never  Vaping Use   Vaping status: Never Used  Substance and Sexual Activity   Alcohol use: Not Currently    Comment: very rarely    Drug use: No   Sexual activity: Not Currently    Birth control/protection: Post-menopausal  Other Topics Concern   Not on file  Social History Narrative   Not on file   Social Drivers of Health   Financial Resource Strain: Not on file  Food Insecurity: Not on file  Transportation Needs: Not on file  Physical Activity: Insufficiently Active (09/21/2022)   Exercise Vital Sign    Days of Exercise per Week: 3 days    Minutes of Exercise per Session: 30 min  Stress: Not on file  Social Connections: Not on file  Intimate Partner Violence: Not on file    Vital Signs: Blood pressure 121/64, pulse 86, temperature 98.6 F (37 C), resp. rate 16, height 5\' 6"  (1.676 m), weight 196 lb (88.9 kg), SpO2 96%.  Examination: General Appearance: The patient is  well-developed, well-nourished, and in no distress. Skin: Gross inspection of skin unremarkable. Head: normocephalic, no gross deformities. Eyes: no gross deformities noted. ENT: ears appear grossly normal no exudates. Neck: Supple. No thyromegaly. No LAD. Respiratory: no rhonchi noted. Cardiovascular: Normal S1 and S2 without murmur or rub. Extremities: No cyanosis. pulses are equal. Neurologic: Alert and oriented. No involuntary movements.  LABS: Recent Results (from the past 2160 hours)  Urinalysis, Complete     Status: Abnormal   Collection Time: 05/31/23  2:09 PM  Result Value Ref Range   Specific Gravity, UA 1.015 1.005 - 1.030   pH, UA 6.0 5.0 - 7.5   Color, UA Yellow Yellow   Appearance Ur Clear Clear   Leukocytes,UA Negative Negative   Protein,UA Negative Negative/Trace   Glucose, UA 3+ (A) Negative   Ketones, UA Negative Negative   RBC, UA Trace (A) Negative   Bilirubin, UA Negative Negative   Urobilinogen, Ur 0.2 0.2 - 1.0 mg/dL   Nitrite, UA Negative Negative   Microscopic Examination See below:   CULTURE, URINE COMPREHENSIVE     Status: Abnormal   Collection Time: 05/31/23  2:09 PM   Specimen: Urine   UR  Result Value Ref Range   Urine Culture, Comprehensive Final report (A)    Organism ID, Bacteria Comment (A)     Comment: Escherichia coli, identified by an automated biochemical system. Cefazolin <=4 ug/mL Cefazolin with an MIC <=16 predicts susceptibility to the oral agents cefaclor, cefdinir, cefpodoxime, cefprozil, cefuroxime, cephalexin, and loracarbef when used for therapy of uncomplicated urinary tract infections due to E. coli, Klebsiella pneumoniae, and Proteus mirabilis. 3,000 Colonies/mL    ANTIMICROBIAL SUSCEPTIBILITY Comment     Comment:       ** S = Susceptible; I = Intermediate; R = Resistant **                    P = Positive; N = Negative             MICS are expressed in micrograms per mL    Antibiotic  RSLT#1    RSLT#2     RSLT#3    RSLT#4 Amoxicillin/Clavulanic Acid    S Ampicillin                     S Cefepime                       S Ceftriaxone                    S Cefuroxime                     S Ciprofloxacin                  S Ertapenem                      S Gentamicin                     S Imipenem                       S Levofloxacin                   S Meropenem                      S Nitrofurantoin                 S Piperacillin/Tazobactam        S Tetracycline                   S Tobramycin                     S Trimethoprim/Sulfa             S   Microscopic Examination     Status: None   Collection Time: 05/31/23  2:09 PM   Urine  Result Value Ref Range   WBC, UA 0-5 0 - 5 /hpf   RBC, Urine 0-2 0 - 2 /hpf   Epithelial Cells (non renal) 0-10 0 - 10 /hpf   Bacteria, UA Few None seen/Few  Comprehensive metabolic panel     Status: Abnormal   Collection Time: 06/11/23 10:32 AM  Result Value Ref Range   Sodium 138 135 - 145 mmol/L   Potassium 4.3 3.5 - 5.1 mmol/L   Chloride 105 98 - 111 mmol/L   CO2 25 22 - 32 mmol/L   Glucose, Bld 115 (H) 70 - 99 mg/dL    Comment: Glucose reference range applies only to samples taken after fasting for at least 8 hours.   BUN 23 8 - 23 mg/dL   Creatinine, Ser 4.09 0.44 - 1.00 mg/dL   Calcium 9.0 8.9 - 81.1 mg/dL   Total Protein 7.1 6.5 - 8.1 g/dL   Albumin 4.1 3.5 - 5.0 g/dL   AST 19 15 - 41 U/L   ALT 19 0 - 44 U/L   Alkaline Phosphatase 57 38 - 126 U/L   Total Bilirubin 0.6 0.3 - 1.2 mg/dL   GFR, Estimated >91 >47 mL/min    Comment: (NOTE) Calculated using the CKD-EPI Creatinine Equation (2021)    Anion gap 8 5 - 15    Comment: Performed at [facility], [address] [place], [place] [address]  Lipid panel     Status: None   Collection  Time: 06/11/23 10:32 AM  Result Value Ref Range   Cholesterol 169 0 - 200 mg/dL   Triglycerides 79 <562 mg/dL   HDL 57 >13 mg/dL   Total CHOL/HDL Ratio 3.0 RATIO   VLDL 16 0 - 40 mg/dL   LDL  Cholesterol 96 0 - 99 mg/dL    Comment:        Total Cholesterol/HDL:CHD Risk Coronary Heart Disease Risk Table                     Men   Women  1/2 Average Risk   3.4   3.3  Average Risk       5.0   4.4  2 X Average Risk   9.6   7.1  3 X Average Risk  23.4   11.0        Use the calculated Patient Ratio above and the CHD Risk Table to determine the patient's CHD Risk.        ATP III CLASSIFICATION (LDL):  <100     mg/dL   Optimal  086-578  mg/dL   Near or Above                    Optimal  130-159  mg/dL   Borderline  469-629  mg/dL   High  >528     mg/dL   Very High Performed at [facility], [address] [place], [place] [address]   CBC with Differential/Platelet     Status: None   Collection Time: 06/11/23 10:32 AM  Result Value Ref Range   WBC 6.3 4.0 - 10.5 K/uL   RBC 4.96 3.87 - 5.11 MIL/uL   Hemoglobin 14.5 12.0 - 15.0 g/dL   HCT 40.1 02.7 - 25.3 %   MCV 90.1 80.0 - 100.0 fL   MCH 29.2 26.0 - 34.0 pg   MCHC 32.4 30.0 - 36.0 g/dL   RDW 66.4 40.3 - 47.4 %   Platelets 214 150 - 400 K/uL   nRBC 0.0 0.0 - 0.2 %   Neutrophils Relative % 37 %   Neutro Abs 2.3 1.7 - 7.7 K/uL   Lymphocytes Relative 53 %   Lymphs Abs 3.3 0.7 - 4.0 K/uL   Monocytes Relative 8 %   Monocytes Absolute 0.5 0.1 - 1.0 K/uL   Eosinophils Relative 1 %   Eosinophils Absolute 0.1 0.0 - 0.5 K/uL   Basophils Relative 1 %   Basophils Absolute 0.0 0.0 - 0.1 K/uL   Immature Granulocytes 0 %   Abs Immature Granulocytes 0.01 0.00 - 0.07 K/uL    Comment: Performed at [facility], [address] [place], [place] [address]  LDL cholesterol, direct     Status: Abnormal   Collection Time: 06/11/23 10:32 AM  Result Value Ref Range   Direct LDL 100 (H) 0 - 99 mg/dL    Comment: Performed at [facility], [address], [place], [place] [address]  Vitamin B12     Status: None   Collection Time: 06/11/23 10:32 AM  Result Value Ref Range   Vitamin B-12 269 180 - 914 pg/mL     Comment: (NOTE) This assay is not validated for testing neonatal or myeloproliferative syndrome specimens for Vitamin B12 levels. Performed at [facility], [address], [place], [place] [address]   Folate     Status: None   Collection Time: 06/11/23 10:32 AM  Result Value Ref Range  Folate 27.0 >5.9 ng/mL    Comment: Performed at [facility], [address] [place], [place] [address]  Hemoglobin A1c     Status: Abnormal   Collection Time: 06/11/23 10:32 AM  Result Value Ref Range   Hgb A1c MFr Bld 6.6 (H) 4.8 - 5.6 %    Comment: (NOTE) Pre diabetes:          5.7%-6.4%  Diabetes:              >6.4%  Glycemic control for   <7.0% adults with diabetes    Mean Plasma Glucose 142.72 mg/dL    Comment: Performed at [facility], [address], [place], [place] [address]  T4, free     Status: None   Collection Time: 06/11/23 10:32 AM  Result Value Ref Range   Free T4 0.80 0.61 - 1.12 ng/dL    Comment: (NOTE) Biotin ingestion may interfere with free T4 tests. If the results are inconsistent with the TSH level, previous test results, or the clinical presentation, then consider biotin interference. If needed, order repeat testing after stopping biotin. Performed at [facility], [address] [place], [place] [address]   TSH     Status: None   Collection Time: 06/11/23 10:32 AM  Result Value Ref Range   TSH 1.797 0.350 - 4.500 uIU/mL    Comment: Performed by a 3rd Generation assay with a functional sensitivity of <=0.01 uIU/mL. Performed at [facility], [address] [place], [place] [address]   VITAMIN D 25 Hydroxy (Vit-D Deficiency, Fractures)     Status: None   Collection Time: 06/11/23 10:32 AM  Result Value Ref Range   Vit D, 25-Hydroxy 32.94 30 - 100 ng/mL    Comment: (NOTE) Vitamin D deficiency has been defined by the Institute of Medicine  and an Endocrine Society practice guideline as a level of serum 25-OH   vitamin D less than 20 ng/mL (1,2). The Endocrine Society went on to  further define vitamin D insufficiency as a level between 21 and 29  ng/mL (2).  1. IOM (Institute of Medicine). 2010. Dietary reference intakes for  calcium and D. [place]: [employer]. 2. Holick MF, Binkley [place], Bischoff-Ferrari HA, et al. Evaluation,  treatment, and prevention of vitamin D deficiency: an Endocrine  Society clinical practice guideline, JCEM. 2011 Jul; 96(7): 1911-30.  Performed at [facility], [address], [place], [place] [address]   POCT Urinalysis Dipstick     Status: Abnormal   Collection Time: 07/05/23 11:17 AM  Result Value Ref Range   Color, UA     Clarity, UA     Glucose, UA Positive (A) Negative   Bilirubin, UA Negative    Ketones, UA Negative    Spec Grav, UA 1.010 1.010 - 1.025   Blood, UA Negative    pH, UA 5.0 5.0 - 8.0   Protein, UA Negative Negative   Urobilinogen, UA 0.2 0.2 or 1.0 E.U./dL   Nitrite, UA Negative    Leukocytes, UA Large (3+) (A) Negative   Appearance     Odor    Urine Microalbumin w/creat. ratio     Status: None   Collection Time: 07/05/23  3:42 PM  Result Value Ref Range   Creatinine, Urine 20.4 Not Estab. mg/dL   Microalbumin, Urine <1.3 Not Estab. ug/mL   Microalb/Creat Ratio <15 0 - 29 mg/g creat    Comment:  Normal:                0 -  29                        Moderately increased: 30 - 300                        Severely increased:       >300   CULTURE, URINE COMPREHENSIVE     Status: Abnormal   Collection Time: 07/05/23  3:42 PM   Specimen: Urine   Urine  Result Value Ref Range   Urine Culture, Comprehensive Final report (A)    Organism ID, Bacteria Escherichia coli (A)     Comment: Cefazolin <=4 ug/mL Cefazolin with an MIC <=16 predicts susceptibility to the oral agents cefaclor, cefdinir, cefpodoxime, cefprozil, cefuroxime, cephalexin, and loracarbef when used for therapy of  uncomplicated urinary tract infections due to E. coli, Klebsiella pneumoniae, and Proteus mirabilis. 25,000-50,000 colony forming units per mL    ANTIMICROBIAL SUSCEPTIBILITY Comment     Comment:       ** S = Susceptible; I = Intermediate; R = Resistant **                    P = Positive; N = Negative             MICS are expressed in micrograms per mL    Antibiotic                 RSLT#1    RSLT#2    RSLT#3    RSLT#4 Amoxicillin/Clavulanic Acid    S Ampicillin                     S Cefepime                       S Ceftriaxone                    S Cefuroxime                     S Ciprofloxacin                  S Ertapenem                      S Gentamicin                     S Imipenem                       S Levofloxacin                   S Meropenem                      S Nitrofurantoin                 S Piperacillin/Tazobactam        S Tetracycline                   S Tobramycin                     S Trimethoprim/Sulfa             S     Radiology: CT  CHEST LUNG CA SCREEN LOW DOSE W/O CM Result Date: 07/05/2023 CLINICAL DATA:  50 pack-year smoking history/quit last year EXAM: CT CHEST WITHOUT CONTRAST LOW-DOSE FOR LUNG CANCER SCREENING TECHNIQUE: Multidetector CT imaging of the chest was performed following the standard protocol without IV contrast. RADIATION DOSE REDUCTION: This exam was performed according to the departmental dose-optimization program which includes automated exposure control, adjustment of the mA and/or kV according to patient size and/or use of iterative reconstruction technique. COMPARISON:  12/20/2021 diagnostic CT. FINDINGS: Cardiovascular: Aortic atherosclerosis. Tortuous thoracic aorta. Mild cardiomegaly, without pericardial effusion. Three vessel coronary artery calcification. Pulmonary artery enlargement, outflow tract 3.4 cm. Mediastinum/Nodes: No mediastinal or hilar adenopathy, given limitations of unenhanced CT. Lungs/Pleura: No pleural fluid. Moderate  centrilobular emphysema. Right lower lobe calcified granuloma of 4.3 mm. Upper Abdomen: Mild hepatic steatosis. Normal imaged portions of the spleen, stomach, pancreas, adrenal glands, left kidney. Musculoskeletal: Right rotator cuff repair. Mild osteopenia. Accentuation of expected thoracic kyphosis. IMPRESSION: 1. Lung-RADS 1, negative. Continue annual screening with low-dose chest CT without contrast in 12 months. 2. Pulmonary artery enlargement suggests pulmonary arterial hypertension. 3. Hepatic steatosis. 4. Aortic Atherosclerosis (ICD10-I70.0) and Emphysema (ICD10-J43.9). Coronary artery atherosclerosis. Electronically Signed   By: Jeronimo Greaves M.D.   On: 07/05/2023 10:18    No results found.  No results found.    Assessment and Plan: Patient Active Problem List   Diagnosis Date Noted   Varicose veins of leg with swelling, bilateral 06/09/2022   Hypokalemia 09/12/2020   Chronic venous stasis 01/11/2020   Atopic dermatitis 01/11/2020   Diastolic dysfunction 09/30/2019   Peripheral edema 09/10/2019   Dyspnea on exertion 09/10/2019   Muscle cramps 09/10/2019   Type 2 diabetes mellitus with hyperglycemia (HCC) 05/18/2019   Seasonal allergic rhinitis 05/18/2019   Screening for breast cancer 05/18/2019   Stenosis of left carotid artery 02/05/2019   Acute upper respiratory infection 10/09/2018   Flu-like symptoms 10/09/2018   Sore throat 10/09/2018   Vitamin D deficiency 07/27/2018   Need for vaccination against Streptococcus pneumoniae using pneumococcal conjugate vaccine 7 07/27/2018   Encounter for general adult medical examination with abnormal findings 04/07/2018   Primary generalized (osteo)arthritis 04/07/2018   Hematuria, microscopic 12/18/2017   Renal colic 12/18/2017   Urinary tract infection without hematuria 11/21/2017   Dysuria 11/21/2017   Uncontrolled type 2 diabetes mellitus with hyperglycemia (HCC) 11/21/2017   Diabetes mellitus type 2, uncomplicated (HCC)  10/04/2017   Osteoporosis 10/04/2017   S/P total hip arthroplasty 06/14/2016   Inguinal hernia, left 06/09/2013   Umbilical hernia 06/09/2013   Increased frequency of urination 11/27/2012   Chronic cystitis 11/19/2012   Incomplete emptying of bladder 11/19/2012   Medullary sponge kidney 11/19/2012   Mixed urge and stress incontinence 11/19/2012   Anxiety 01/02/2012   Calculus of kidney 01/02/2012   Chronic obstructive pulmonary disease (HCC) 01/02/2012   GERD (gastroesophageal reflux disease) 01/02/2012   Hearing loss 01/02/2012   Mixed hyperlipidemia 01/02/2012   Obesity, unspecified 01/02/2012   OSA on CPAP 01/02/2012   Tobacco abuse 01/02/2012   Venous insufficiency 01/02/2012   1. Chronic hypoxemic respiratory failure (HCC) (Primary) Continue oxygen as before  2. PAH (pulmonary artery hypertension) (HCC) Will follow up on medication status  3. Oxygen dependent Continue oxygen   General Counseling: I have discussed the findings of the evaluation and examination with Madasyn.  I have also discussed any further diagnostic evaluation thatmay be needed or ordered today. Ifeoluwa verbalizes understanding of the findings of todays visit.  We also reviewed her medications today and discussed drug interactions and side effects including but not limited excessive drowsiness and altered mental states. We also discussed that there is always a risk not just to her but also people around her. she has been encouraged to call the office with any questions or concerns that should arise related to todays visit.  No orders of the defined types were placed in this encounter.    Time spent: 30  I have personally obtained a history, examined the patient, evaluated laboratory and imaging results, formulated the assessment and plan and placed orders. This patient was seen by Lynn Ito, PA-C in collaboration with Dr. Freda Munro as a part of collaborative care agreement.     Yevonne Pax, MD  [facility] Pulmonary and Critical Care Sleep medicine

## 2023-08-16 NOTE — Addendum Note (Signed)
Addended by: Annamaria Helling on: 08/16/2023 02:19 PM   Modules accepted: Orders

## 2023-08-20 ENCOUNTER — Telehealth: Payer: Self-pay

## 2023-08-20 NOTE — Telephone Encounter (Signed)
Faxed P.A. appeal for patient's Opsumit.

## 2023-08-31 ENCOUNTER — Telehealth: Payer: Self-pay | Admitting: Physician Assistant

## 2023-09-03 ENCOUNTER — Other Ambulatory Visit: Payer: Self-pay | Admitting: Physician Assistant

## 2023-09-03 MED ORDER — CYCLOBENZAPRINE HCL 5 MG PO TABS: ORAL_TABLET | ORAL | 0 refills | Status: DC

## 2023-09-03 NOTE — Telephone Encounter (Signed)
 Lmom that to call us back

## 2023-09-03 NOTE — Telephone Encounter (Signed)
 Spoke with pt that she had terrible pain she already talk to lauren at last  visit pt advised that we send flexeril 5 mg take 1/2 tab at at bedtime and make her drowsy please be careful

## 2023-09-04 ENCOUNTER — Telehealth: Payer: Self-pay

## 2023-09-04 NOTE — Telephone Encounter (Signed)
 I have completed and faxed form to [employer] for patient's Opsumit. If patient calls, which she most likely will, please notify her that we have to wait for a response. If this does not work, I will have to ask DSK for an alternative medication for her.

## 2023-09-13 ENCOUNTER — Ambulatory Visit (INDEPENDENT_AMBULATORY_CARE_PROVIDER_SITE_OTHER): Payer: Medicare Other

## 2023-09-13 DIAGNOSIS — E538 Deficiency of other specified B group vitamins: Secondary | ICD-10-CM

## 2023-09-13 MED ORDER — CYANOCOBALAMIN 1000 MCG/ML IJ SOLN
1000.0000 ug | Freq: Once | INTRAMUSCULAR | Status: AC
  Administered 2023-09-13: 1000 ug via INTRAMUSCULAR

## 2023-09-17 ENCOUNTER — Ambulatory Visit (INDEPENDENT_AMBULATORY_CARE_PROVIDER_SITE_OTHER): Payer: Medicare Other | Admitting: Internal Medicine

## 2023-09-17 ENCOUNTER — Encounter: Payer: Self-pay | Admitting: Internal Medicine

## 2023-09-17 VITALS — BP 110/80 | HR 77 | Temp 98.2°F | Resp 16 | Ht 66.0 in | Wt 199.8 lb

## 2023-09-17 DIAGNOSIS — J9611 Chronic respiratory failure with hypoxia: Secondary | ICD-10-CM | POA: Diagnosis not present

## 2023-09-17 DIAGNOSIS — R609 Edema, unspecified: Secondary | ICD-10-CM | POA: Diagnosis not present

## 2023-09-17 DIAGNOSIS — I2721 Secondary pulmonary arterial hypertension: Secondary | ICD-10-CM | POA: Diagnosis not present

## 2023-09-17 DIAGNOSIS — Z9981 Dependence on supplemental oxygen: Secondary | ICD-10-CM

## 2023-09-17 NOTE — Patient Instructions (Signed)
Pulmonary Hypertension Pulmonary hypertension is a condition that causes high blood pressure in the blood vessels of your lungs. This makes your heart work extra hard to pump blood to your lungs. And when your heart has to work harder, it can be harder for you to breathe. Over time, this can hurt and weaken your heart. What are the causes? This condition can be put into one of five groups based on what causes it. Group 1 happens when the blood vessels that carry blood to your lungs get too thick or stiff. This may happen with no known cause or it may be: Inherited. This means it's passed from parent to child. Caused by another disease, such as a disease of the heart or liver. Caused by some medicines or poisons. Group 2 happens if you have problems with the valves in your heart or if the left side of your heart, also called your left ventricle, gets weak. Group 3 can be caused by long-term diseases of the lungs, such as chronic obstructive pulmonary disease or COPD. This can also happen if you don't get enough oxygen, such as if you have trouble breathing when you sleep or if you live at a high altitude. Group 4 is caused by blood clots in your lungs. Group 5 includes other causes, such as sickle cell anemia or growths of cells that aren't normal called tumors. What are the signs or symptoms? Symptoms may include: Shortness of breath. A cough. Tiredness. Feeling dizzy or light-headed. You may also faint. A fast heart beat. You may also feel your heart flutter or skip a beat. Your lips or fingers turning blue. Chest pain or tightness. How is this diagnosed? This condition may be diagnosed with: Blood tests. Imaging tests. These may include: Chest X-rays. CT scans. An echocardiogram. This is an ultrasound of your heart. A ventilation-perfusion scan. This sees how well air and blood flow in and out of your lungs. A pulmonary function test. This looks at how much air your lungs can  hold. A 6-minute walk test. This may be done to check your breathing during exercise. Cardiac catheterization. This is a procedure that uses a soft tube called a catheter to check the arteries of your heart. A lung biopsy. This is when a small piece of tissue is removed from your lung for testing. How is this treated? There's no cure for this condition. But treatment can help you feel better and can slow the condition down. You may need: Oxygen therapy. Cardiac rehabilitation, or rehab. This is a program that teaches you how to: Care for your heart. Exercise. Go back to your normal activities. Medicines to: Lower your blood pressure. Relax the blood vessels in your lungs. Help your heart pump more blood. Help your body get rid of extra fluid. Thin your blood. This can stop you from getting blood clots. Lung surgery. This can relieve pressure on your heart. You may need this if other treatments don't work. Heart-lung or lung transplant. This may be done in very severe cases. Follow these instructions at home: Eating and drinking  Eat a healthy diet. Eat lots of fresh fruits and vegetables, whole grains, and beans. Limit how much salt you take in to less than 2,300 mg a day. Salt is also called sodium. Activity Get lots of rest. Exercise as told. Ask your health care provider what types of exercise are safe for you. Lifestyle Do not use any products that contain nicotine or tobacco. These products include cigarettes, chewing  tobacco, and vaping devices, such as e-cigarettes. If you need help quitting, ask your provider. Stay away when other people smoke. Do not sit in hot tubs or saunas for long stretches of time. Do not get pregnant. If needed, talk with your provider about birth control. Avoid high altitudes. It can be stressful to live with pulmonary hypertension. Talk with your provider about finding support groups and online help. General instructions Take over-the-counter and  prescription medicines only as told by your provider. Do not change or stop medicines without talking with your provider. Stay up to date on your shots, such as your yearly flu shot and pneumonia shot. Use oxygen therapy at home as told. Keep all follow-up visits. Your provider will check your breathing and make changes to your medicines as needed. Contact a health care provider if: Your cough gets worse. You have more shortness of breath. You start to have trouble doing things you could do before. You need to use more medicines or oxygen, or you need to use them more often than normal. Get help right away if: You have severe shortness of breath. You have chest pain or pressure. You cough up blood. These symptoms may be an emergency. Get help right away. Call 911. Do not wait to see if the symptoms will go away. Do not drive yourself to the hospital. This information is not intended to replace advice given to you by your health care provider. Make sure you discuss any questions you have with your health care provider. Document Revised: 10/30/2022 Document Reviewed: 10/30/2022 Elsevier Patient Education  2024 [employer].

## 2023-09-17 NOTE — Progress Notes (Signed)
[facility] [address] [place], [place] [address]  Pulmonary Sleep Medicine   Office Visit Note  Patient Name: Amber Holder DOB: 19-Jun-1946 MRN [id number]  Date of Service: 09/17/2023  Complaints/HPI: She finally got the opsumit and starting it about 3 days ago. She has noted swelling of her legs. She was told to double up on her torsemide. She notes she is taking potassium supplements also. She states she has had an upset stomach due to the postassium. She may actually benefit from spiranolactone to help reduce her potassium supplement  Office Spirometry Results:     ROS  General: (-) fever, (-) chills, (-) night sweats, (-) weakness Skin: (-) rashes, (-) itching,. Eyes: (-) visual changes, (-) redness, (-) itching. Nose and Sinuses: (-) nasal stuffiness or itchiness, (-) postnasal drip, (-) nosebleeds, (-) sinus trouble. Mouth and Throat: (-) sore throat, (-) hoarseness. Neck: (-) swollen glands, (-) enlarged thyroid, (-) neck pain. Respiratory: - cough, (-) bloody sputum, - shortness of breath, - wheezing. Cardiovascular: + ankle swelling, (-) chest pain. Lymphatic: (-) lymph node enlargement. Neurologic: (-) numbness, (-) tingling. Psychiatric: (-) anxiety, (-) depression   Current Medication: Outpatient Encounter Medications as of 09/17/2023  Medication Sig   acetaminophen (TYLENOL) 500 MG chewable tablet Chew 1,000 mg by mouth every 8 (eight) hours as needed for pain.   albuterol (VENTOLIN HFA) 108 (90 Base) MCG/ACT inhaler Inhale 2 puffs into the lungs every 6 (six) hours as needed for wheezing or shortness of breath.   aspirin EC 81 MG tablet Take 1 tablet (81 mg total) by mouth daily. Swallow whole.   atorvastatin (LIPITOR) 40 MG tablet Take 1 tablet (40 mg total) by mouth daily.   cholecalciferol (VITAMIN D) 400 UNITS TABS tablet Take 2,000 Units by mouth daily.   CRANBERRY PO Take 25,000 mg by mouth daily.   cyclobenzaprine (FLEXERIL) 5 MG tablet  Take 1/2-1 tablet by mouth before bed for back spasm as needed   diclofenac Sodium (VOLTAREN) 1 % GEL Apply small amount to area of pain daily as needed   ergocalciferol (DRISDOL) 1.25 MG (50000 UT) capsule Take one cap q week   estradiol (ESTRACE) 0.1 MG/GM vaginal cream Estrogen Cream Instruction Discard applicator Apply pea sized amount to tip of finger to urethra before bed. Wash hands well after application. Use Monday, Wednesday and Friday   FARXIGA 10 MG TABS tablet TAKE 1 TABLET BY MOUTH DAILY BEFORE BREAKFAST.   fluconazole (DIFLUCAN) 150 MG tablet Take one tab po q week for fungal infection and as needed   Fluticasone-Umeclidin-Vilant (TRELEGY ELLIPTA) 100-62.5-25 MCG/ACT AEPB USE 1 INHALATION ORALLY    DAILY.   glucose blood (ONETOUCH VERIO) test strip BLOOD SUGAR TESTING ONCE DAILY . DX E11.65   hydrocortisone cream 0.5 % Apply topically.   hydrocortisone cream 0.5 % Apply topically as needed.   ibandronate (BONIVA) 150 MG tablet TAKE 1 TABLET (150 MG TOTAL) BY MOUTH EVERY 30 (THIRTY) DAYS.   KLOR-CON M20 20 MEQ tablet TAKE 2 TABLETS EVERY MORNING AND 1 TABLET AT NIGHT AND THEN MAY TAKE ONE EXTRA PER DOCTOR DIRECTION. DOSE INCREASE   Lancets (ONETOUCH DELICA PLUS LANCET30G) MISC Use  as directed twice a daily DX E11.65   macitentan (OPSUMIT) 10 MG tablet Take 1 tablet (10 mg total) by mouth daily.   Magnesium 250 MG TABS Take by mouth. Takes 1 tablet 2-3 times per week   montelukast (SINGULAIR) 10 MG tablet Take 1 tablet (10 mg total) by mouth daily  as needed.   Multiple Vitamins-Minerals (MULTIVITAMIN ADULTS PO) Take 1 tablet by mouth daily.   nitrofurantoin, macrocrystal-monohydrate, (MACROBID) 100 MG capsule One tab po bid x 10 days and then MW/F once a day until finished   nitrofurantoin, macrocrystal-monohydrate, (MACROBID) 100 MG capsule Take 1 capsule (100 mg total) by mouth 2 (two) times daily.   omeprazole (PRILOSEC) 40 MG capsule Take 1 capsule (40 mg total) by mouth daily.  (Patient taking differently: Take 40 mg by mouth as needed.)   OXYGEN Inhale 3 L into the lungs. Pt uses [facility] for Oxygen   predniSONE (DELTASONE) 10 MG tablet Take 1 tab po 3 x day for 3 days then take 1 tab po 2 x a day for 3 days and then take 1 tab po daily for 3 days   Simethicone 80 MG TABS Take 1 tablet (80 mg total) by mouth as needed (burping). As directed. (Patient taking differently: Take 1 tablet by mouth as needed (burping).)   torsemide (DEMADEX) 20 MG tablet Take 1 tablet (20 mg total) by mouth daily. May take an extra 20 mg as needed for swelling   Facility-Administered Encounter Medications as of 09/17/2023  Medication   nitrofurantoin (macrocrystal-monohydrate) (MACROBID) capsule 100 mg    Surgical History: Past Surgical History:  Procedure Laterality Date   APPENDECTOMY     cataract surgery Bilateral    CHOLECYSTECTOMY     COLONOSCOPY     COLONOSCOPY WITH PROPOFOL N/A 05/28/2015   Procedure: COLONOSCOPY WITH PROPOFOL;  Surgeon: Christena Deem, MD;  Location: [facility] ENDOSCOPY;  Service: Endoscopy;  Laterality: N/A;   COLONOSCOPY WITH PROPOFOL N/A 10/28/2018   Procedure: COLONOSCOPY WITH PROPOFOL;  Surgeon: Christena Deem, MD;  Location: [facility] ENDOSCOPY;  Service: Endoscopy;  Laterality: N/A;   EYE SURGERY Bilateral 2015   Cataract Extraction with IOL   LITHOTRIPSY  2015   has had many stones   Mastoidotomy  1970   ROTATOR CUFF REPAIR Right 2011   TOTAL HIP ARTHROPLASTY Right 06/14/2016   Procedure: TOTAL HIP ARTHROPLASTY;  Surgeon: Donato Heinz, MD;  Location: [facility] ORS;  Service: Orthopedics;  Laterality: Right;    Medical History: Past Medical History:  Diagnosis Date   Anxiety 01/02/2012   Arthritis    Asthma    Blockage of coronary artery of heart (HCC)    Chronic kidney disease    Kidney stones   Colon polyps    COPD (chronic obstructive pulmonary disease) (HCC)    Diabetes mellitus without complication (HCC)    GERD (gastroesophageal  reflux disease)    HOH (hard of hearing)    Bilateral hearing aids   Kidney stone    Osteoporosis    Sleep apnea    Does not use C-PAP on a regular basis   Venous stasis     Family History: Family History  Problem Relation Age of Onset   Breast cancer Maternal Aunt 50   Breast cancer Maternal Aunt    Bladder Cancer Neg Hx    Kidney cancer Neg Hx     Social History: Social History   Socioeconomic History   Marital status: Divorced    Spouse name: Not on file   Number of children: Not on file   Years of education: Not on file   Highest education level: Not on file  Occupational History   Not on file  Tobacco Use   Smoking status: Former    Current packs/day: 0.00    Average packs/day: 1  pack/day for 50.0 years (50.0 ttl pk-yrs)    Types: Cigarettes    Start date: 12/29/1971    Quit date: 12/28/2021    Years since quitting: 1.7    Passive exposure: Never   Smokeless tobacco: Never  Vaping Use   Vaping status: Never Used  Substance and Sexual Activity   Alcohol use: Not Currently    Comment: very rarely    Drug use: No   Sexual activity: Not Currently    Birth control/protection: Post-menopausal  Other Topics Concern   Not on file  Social History Narrative   Not on file   Social Drivers of Health   Financial Resource Strain: Not on file  Food Insecurity: Not on file  Transportation Needs: Not on file  Physical Activity: Insufficiently Active (09/21/2022)   Exercise Vital Sign    Days of Exercise per Week: 3 days    Minutes of Exercise per Session: 30 min  Stress: Not on file  Social Connections: Not on file  Intimate Partner Violence: Not on file    Vital Signs: Blood pressure 110/80, pulse 77, temperature 98.2 F (36.8 C), resp. rate 16, height 5\' 6"  (1.676 m), weight 199 lb 12.8 oz (90.6 kg), SpO2 94%.  Examination: General Appearance: The patient is well-developed, well-nourished, and in no distress. Skin: Gross inspection of skin  unremarkable. Head: normocephalic, no gross deformities. Eyes: no gross deformities noted. ENT: ears appear grossly normal no exudates. Neck: Supple. No thyromegaly. No LAD. Respiratory: no rhonch inoted. Cardiovascular: Normal S1 and S2 without murmur or rub. Extremities: No cyanosis. pulses are equal. Neurologic: Alert and oriented. No involuntary movements.  LABS: Recent Results (from the past 2160 hours)  POCT Urinalysis Dipstick     Status: Abnormal   Collection Time: 07/05/23 11:17 AM  Result Value Ref Range   Color, UA     Clarity, UA     Glucose, UA Positive (A) Negative   Bilirubin, UA Negative    Ketones, UA Negative    Spec Grav, UA 1.010 1.010 - 1.025   Blood, UA Negative    pH, UA 5.0 5.0 - 8.0   Protein, UA Negative Negative   Urobilinogen, UA 0.2 0.2 or 1.0 E.U./dL   Nitrite, UA Negative    Leukocytes, UA Large (3+) (A) Negative   Appearance     Odor    Urine Microalbumin w/creat. ratio     Status: None   Collection Time: 07/05/23  3:42 PM  Result Value Ref Range   Creatinine, Urine 20.4 Not Estab. mg/dL   Microalbumin, Urine <0.1 Not Estab. ug/mL   Microalb/Creat Ratio <15 0 - 29 mg/g creat    Comment:                        Normal:                0 -  29                        Moderately increased: 30 - 300                        Severely increased:       >300   CULTURE, URINE COMPREHENSIVE     Status: Abnormal   Collection Time: 07/05/23  3:42 PM   Specimen: Urine   Urine  Result Value Ref Range   Urine Culture, Comprehensive Final report (A)  Organism ID, Bacteria Escherichia coli (A)     Comment: Cefazolin <=4 ug/mL Cefazolin with an MIC <=16 predicts susceptibility to the oral agents cefaclor, cefdinir, cefpodoxime, cefprozil, cefuroxime, cephalexin, and loracarbef when used for therapy of uncomplicated urinary tract infections due to E. coli, Klebsiella pneumoniae, and Proteus mirabilis. 25,000-50,000 colony forming units per mL     ANTIMICROBIAL SUSCEPTIBILITY Comment     Comment:       ** S = Susceptible; I = Intermediate; R = Resistant **                    P = Positive; N = Negative             MICS are expressed in micrograms per mL    Antibiotic                 RSLT#1    RSLT#2    RSLT#3    RSLT#4 Amoxicillin/Clavulanic Acid    S Ampicillin                     S Cefepime                       S Ceftriaxone                    S Cefuroxime                     S Ciprofloxacin                  S Ertapenem                      S Gentamicin                     S Imipenem                       S Levofloxacin                   S Meropenem                      S Nitrofurantoin                 S Piperacillin/Tazobactam        S Tetracycline                   S Tobramycin                     S Trimethoprim/Sulfa             S     Radiology: CT CHEST LUNG CA SCREEN LOW DOSE W/O CM Result Date: 07/05/2023 CLINICAL DATA:  50 pack-year smoking history/quit last year EXAM: CT CHEST WITHOUT CONTRAST LOW-DOSE FOR LUNG CANCER SCREENING TECHNIQUE: Multidetector CT imaging of the chest was performed following the standard protocol without IV contrast. RADIATION DOSE REDUCTION: This exam was performed according to the departmental dose-optimization program which includes automated exposure control, adjustment of the mA and/or kV according to patient size and/or use of iterative reconstruction technique. COMPARISON:  12/20/2021 diagnostic CT. FINDINGS: Cardiovascular: Aortic atherosclerosis. Tortuous thoracic aorta. Mild cardiomegaly, without pericardial effusion. Three vessel coronary artery calcification. Pulmonary artery enlargement, outflow tract 3.4 cm. Mediastinum/Nodes: No mediastinal or hilar adenopathy, given limitations of unenhanced CT. Lungs/Pleura: No  pleural fluid. Moderate centrilobular emphysema. Right lower lobe calcified granuloma of 4.3 mm. Upper Abdomen: Mild hepatic steatosis. Normal imaged portions of the spleen,  stomach, pancreas, adrenal glands, left kidney. Musculoskeletal: Right rotator cuff repair. Mild osteopenia. Accentuation of expected thoracic kyphosis. IMPRESSION: 1. Lung-RADS 1, negative. Continue annual screening with low-dose chest CT without contrast in 12 months. 2. Pulmonary artery enlargement suggests pulmonary arterial hypertension. 3. Hepatic steatosis. 4. Aortic Atherosclerosis (ICD10-I70.0) and Emphysema (ICD10-J43.9). Coronary artery atherosclerosis. Electronically Signed   By: Jeronimo Greaves M.D.   On: 07/05/2023 10:18    No results found.  No results found.  Assessment and Plan: Patient Active Problem List   Diagnosis Date Noted   Varicose veins of leg with swelling, bilateral 06/09/2022   Hypokalemia 09/12/2020   Chronic venous stasis 01/11/2020   Atopic dermatitis 01/11/2020   Diastolic dysfunction 09/30/2019   Peripheral edema 09/10/2019   Dyspnea on exertion 09/10/2019   Muscle cramps 09/10/2019   Type 2 diabetes mellitus with hyperglycemia (HCC) 05/18/2019   Seasonal allergic rhinitis 05/18/2019   Screening for breast cancer 05/18/2019   Stenosis of left carotid artery 02/05/2019   Acute upper respiratory infection 10/09/2018   Flu-like symptoms 10/09/2018   Sore throat 10/09/2018   Vitamin D deficiency 07/27/2018   Need for vaccination against Streptococcus pneumoniae using pneumococcal conjugate vaccine 7 07/27/2018   Encounter for general adult medical examination with abnormal findings 04/07/2018   Primary generalized (osteo)arthritis 04/07/2018   Hematuria, microscopic 12/18/2017   Renal colic 12/18/2017   Urinary tract infection without hematuria 11/21/2017   Dysuria 11/21/2017   Uncontrolled type 2 diabetes mellitus with hyperglycemia (HCC) 11/21/2017   Diabetes mellitus type 2, uncomplicated (HCC) 10/04/2017   Osteoporosis 10/04/2017   S/P total hip arthroplasty 06/14/2016   Inguinal hernia, left 06/09/2013   Umbilical hernia 06/09/2013   Increased  frequency of urination 11/27/2012   Chronic cystitis 11/19/2012   Incomplete emptying of bladder 11/19/2012   Medullary sponge kidney 11/19/2012   Mixed urge and stress incontinence 11/19/2012   Anxiety 01/02/2012   Calculus of kidney 01/02/2012   Chronic obstructive pulmonary disease (HCC) 01/02/2012   GERD (gastroesophageal reflux disease) 01/02/2012   Hearing loss 01/02/2012   Mixed hyperlipidemia 01/02/2012   Obesity, unspecified 01/02/2012   OSA on CPAP 01/02/2012   Tobacco abuse 01/02/2012   Venous insufficiency 01/02/2012    1. Chronic hypoxemic respiratory failure (HCC) (Primary) She is on oxygen this will be continued  2. PAH (pulmonary artery hypertension) (HCC) On opsumit will continue and monitor  3. Oxygen dependent Supllemental oxygen  4. Edema  May consider spironolactone if cardiology is ok with it for diuresis   General Counseling: I have discussed the findings of the evaluation and examination with Amber Holder.  I have also discussed any further diagnostic evaluation thatmay be needed or ordered today. Chitara verbalizes understanding of the findings of todays visit. We also reviewed her medications today and discussed drug interactions and side effects including but not limited excessive drowsiness and altered mental states. We also discussed that there is always a risk not just to her but also people around her. she has been encouraged to call the office with any questions or concerns that should arise related to todays visit.  No orders of the defined types were placed in this encounter.    Time spent: 43  I have personally obtained a history, examined the patient, evaluated laboratory and imaging results, formulated the assessment and plan and placed orders.  Yevonne Pax, MD [facility] Pulmonary and Critical Care Sleep medicine

## 2023-10-04 DIAGNOSIS — L218 Other seborrheic dermatitis: Secondary | ICD-10-CM | POA: Diagnosis not present

## 2023-10-04 DIAGNOSIS — L578 Other skin changes due to chronic exposure to nonionizing radiation: Secondary | ICD-10-CM | POA: Diagnosis not present

## 2023-10-04 DIAGNOSIS — L570 Actinic keratosis: Secondary | ICD-10-CM | POA: Diagnosis not present

## 2023-10-04 DIAGNOSIS — Z872 Personal history of diseases of the skin and subcutaneous tissue: Secondary | ICD-10-CM | POA: Diagnosis not present

## 2023-10-04 DIAGNOSIS — Z86018 Personal history of other benign neoplasm: Secondary | ICD-10-CM | POA: Diagnosis not present

## 2023-10-15 ENCOUNTER — Other Ambulatory Visit: Payer: Self-pay

## 2023-10-15 MED ORDER — TORSEMIDE 20 MG PO TABS: 20.0000 mg | ORAL_TABLET | Freq: Every day | ORAL | 1 refills | Status: DC

## 2023-10-15 NOTE — Telephone Encounter (Signed)
Requested Prescriptions   Signed Prescriptions Disp Refills   torsemide (DEMADEX) 20 MG tablet 45 tablet 1    Sig: Take 1 tablet (20 mg total) by mouth daily. May take an extra 20 mg as needed for swelling    Authorizing Provider: Debbe Odea    Ordering User: Guerry Minors

## 2023-10-16 ENCOUNTER — Other Ambulatory Visit: Payer: Self-pay

## 2023-10-16 DIAGNOSIS — E1165 Type 2 diabetes mellitus with hyperglycemia: Secondary | ICD-10-CM

## 2023-10-16 DIAGNOSIS — F320 Major depressive disorder, single episode, mild: Secondary | ICD-10-CM

## 2023-10-16 DIAGNOSIS — J9611 Chronic respiratory failure with hypoxia: Secondary | ICD-10-CM

## 2023-10-16 DIAGNOSIS — N390 Urinary tract infection, site not specified: Secondary | ICD-10-CM

## 2023-10-16 DIAGNOSIS — J418 Mixed simple and mucopurulent chronic bronchitis: Secondary | ICD-10-CM

## 2023-10-16 MED ORDER — TRELEGY ELLIPTA 100-62.5-25 MCG/ACT IN AEPB: INHALATION_SPRAY | RESPIRATORY_TRACT | 4 refills | Status: DC

## 2023-10-18 ENCOUNTER — Ambulatory Visit: Payer: Medicare Other

## 2023-10-22 ENCOUNTER — Ambulatory Visit (INDEPENDENT_AMBULATORY_CARE_PROVIDER_SITE_OTHER): Payer: Medicare Other

## 2023-10-22 DIAGNOSIS — E538 Deficiency of other specified B group vitamins: Secondary | ICD-10-CM

## 2023-10-22 MED ORDER — CYANOCOBALAMIN 1000 MCG/ML IJ SOLN
1000.0000 ug | Freq: Once | INTRAMUSCULAR | Status: AC
  Administered 2023-10-22: 1000 ug via INTRAMUSCULAR

## 2023-10-29 DIAGNOSIS — Z961 Presence of intraocular lens: Secondary | ICD-10-CM | POA: Diagnosis not present

## 2023-10-29 DIAGNOSIS — E119 Type 2 diabetes mellitus without complications: Secondary | ICD-10-CM | POA: Diagnosis not present

## 2023-10-29 DIAGNOSIS — H04123 Dry eye syndrome of bilateral lacrimal glands: Secondary | ICD-10-CM | POA: Diagnosis not present

## 2023-11-01 ENCOUNTER — Ambulatory Visit (INDEPENDENT_AMBULATORY_CARE_PROVIDER_SITE_OTHER): Payer: Medicare Other | Admitting: Physician Assistant

## 2023-11-01 ENCOUNTER — Encounter: Payer: Self-pay | Admitting: Physician Assistant

## 2023-11-01 VITALS — BP 120/60 | HR 75 | Temp 98.1°F | Resp 16 | Ht 66.0 in | Wt 197.8 lb

## 2023-11-01 DIAGNOSIS — E1165 Type 2 diabetes mellitus with hyperglycemia: Secondary | ICD-10-CM | POA: Diagnosis not present

## 2023-11-01 DIAGNOSIS — J0100 Acute maxillary sinusitis, unspecified: Secondary | ICD-10-CM

## 2023-11-01 DIAGNOSIS — Z9981 Dependence on supplemental oxygen: Secondary | ICD-10-CM | POA: Diagnosis not present

## 2023-11-01 DIAGNOSIS — J9611 Chronic respiratory failure with hypoxia: Secondary | ICD-10-CM

## 2023-11-01 DIAGNOSIS — R197 Diarrhea, unspecified: Secondary | ICD-10-CM | POA: Diagnosis not present

## 2023-11-01 LAB — POCT GLYCOSYLATED HEMOGLOBIN (HGB A1C): Hemoglobin A1C: 6.4 % — AB (ref 4.0–5.6)

## 2023-11-01 MED ORDER — AZITHROMYCIN 250 MG PO TABS: ORAL_TABLET | ORAL | 0 refills | Status: AC

## 2023-11-01 NOTE — Progress Notes (Signed)
 [facility] [address] [place], [place] [address]  Internal MEDICINE  Office Visit Note  Patient Name: Amber Holder  [id number]  [id number]  Date of Service: 11/01/2023  Chief Complaint  Patient presents with   Follow-up   Diabetes   Gastroesophageal Reflux    HPI Pt is here for routine follow up -had a cold recently, feeling more SOB with walking recently. She is wearing her oxygen. Some congestion and reports some color to mucus she coughs out. Not coughing much though -does state when weather was warm the other day she tried to rake outside and thinks pollen got to her. -wearing compression stockings, still has ankle swelling. Cardiology has told her to double up fluid pill as needed and is waiting to take this today until after her appt -states she is not eating as much to try to keep weight down, but still feels like she can't lose any weight and thinks it is more related to the fluid -does get occasional pain in center of chest in evening, but this goes away quickly with small sips of water. She does have hx of reflux. She is going to follow up with cardiology. Denies symptoms in office today. Discussed going to ED if new or worsening symptoms arise. -she is on opsumit now by pulmonology -does get diarrhea still despite stopping metformin months ago. She is going to follow up with GI  Current Medication: Outpatient Encounter Medications as of 11/01/2023  Medication Sig   acetaminophen (TYLENOL) 500 MG chewable tablet Chew 1,000 mg by mouth every 8 (eight) hours as needed for pain.   albuterol (VENTOLIN HFA) 108 (90 Base) MCG/ACT inhaler Inhale 2 puffs into the lungs every 6 (six) hours as needed for wheezing or shortness of breath.   aspirin EC 81 MG tablet Take 1 tablet (81 mg total) by mouth daily. Swallow whole.   atorvastatin (LIPITOR) 40 MG tablet Take 1 tablet (40 mg total) by mouth daily.   azithromycin (ZITHROMAX) 250 MG tablet Take 2 tablets on day 1, then  1 tablet daily on days 2 through 5   cholecalciferol (VITAMIN D) 400 UNITS TABS tablet Take 2,000 Units by mouth daily.   CRANBERRY PO Take 25,000 mg by mouth daily.   cyclobenzaprine (FLEXERIL) 5 MG tablet Take 1/2-1 tablet by mouth before bed for back spasm as needed   diclofenac Sodium (VOLTAREN) 1 % GEL Apply small amount to area of pain daily as needed   ergocalciferol (DRISDOL) 1.25 MG (50000 UT) capsule Take one cap q week   estradiol (ESTRACE) 0.1 MG/GM vaginal cream Estrogen Cream Instruction Discard applicator Apply pea sized amount to tip of finger to urethra before bed. Wash hands well after application. Use Monday, Wednesday and Friday   FARXIGA 10 MG TABS tablet TAKE 1 TABLET BY MOUTH DAILY BEFORE BREAKFAST.   fluconazole (DIFLUCAN) 150 MG tablet Take one tab po q week for fungal infection and as needed   Fluticasone-Umeclidin-Vilant (TRELEGY ELLIPTA) 100-62.5-25 MCG/ACT AEPB Use inhale  orally daily   glucose blood (ONETOUCH VERIO) test strip BLOOD SUGAR TESTING ONCE DAILY . DX E11.65   hydrocortisone cream 0.5 % Apply topically.   hydrocortisone cream 0.5 % Apply topically as needed.   ibandronate (BONIVA) 150 MG tablet TAKE 1 TABLET (150 MG TOTAL) BY MOUTH EVERY 30 (THIRTY) DAYS.   KLOR-CON M20 20 MEQ tablet TAKE 2 TABLETS EVERY MORNING AND 1 TABLET AT NIGHT AND THEN MAY TAKE ONE EXTRA PER DOCTOR DIRECTION. DOSE INCREASE  Lancets (ONETOUCH DELICA PLUS LANCET30G) MISC Use  as directed twice a daily DX E11.65   macitentan (OPSUMIT) 10 MG tablet Take 1 tablet (10 mg total) by mouth daily.   Magnesium 250 MG TABS Take by mouth. Takes 1 tablet 2-3 times per week   montelukast (SINGULAIR) 10 MG tablet Take 1 tablet (10 mg total) by mouth daily as needed.   Multiple Vitamins-Minerals (MULTIVITAMIN ADULTS PO) Take 1 tablet by mouth daily.   nitrofurantoin, macrocrystal-monohydrate, (MACROBID) 100 MG capsule One tab po bid x 10 days and then MW/F once a day until finished    nitrofurantoin, macrocrystal-monohydrate, (MACROBID) 100 MG capsule Take 1 capsule (100 mg total) by mouth 2 (two) times daily.   omeprazole (PRILOSEC) 40 MG capsule Take 1 capsule (40 mg total) by mouth daily. (Patient taking differently: Take 40 mg by mouth as needed.)   OXYGEN Inhale 3 L into the lungs. Pt uses [facility] for Oxygen   predniSONE (DELTASONE) 10 MG tablet Take 1 tab po 3 x day for 3 days then take 1 tab po 2 x a day for 3 days and then take 1 tab po daily for 3 days   Simethicone 80 MG TABS Take 1 tablet (80 mg total) by mouth as needed (burping). As directed. (Patient taking differently: Take 1 tablet by mouth as needed (burping).)   torsemide (DEMADEX) 20 MG tablet Take 1 tablet (20 mg total) by mouth daily. May take an extra 20 mg as needed for swelling   Facility-Administered Encounter Medications as of 11/01/2023  Medication   nitrofurantoin (macrocrystal-monohydrate) (MACROBID) capsule 100 mg    Surgical History: Past Surgical History:  Procedure Laterality Date   APPENDECTOMY     cataract surgery Bilateral    CHOLECYSTECTOMY     COLONOSCOPY     COLONOSCOPY WITH PROPOFOL N/A 05/28/2015   Procedure: COLONOSCOPY WITH PROPOFOL;  Surgeon: Christena Deem, MD;  Location: [facility];  Service: Endoscopy;  Laterality: N/A;   COLONOSCOPY WITH PROPOFOL N/A 10/28/2018   Procedure: COLONOSCOPY WITH PROPOFOL;  Surgeon: Christena Deem, MD;  Location: [facility] ENDOSCOPY;  Service: Endoscopy;  Laterality: N/A;   EYE SURGERY Bilateral 2015   Cataract Extraction with IOL   LITHOTRIPSY  2015   has had many stones   Mastoidotomy  1970   ROTATOR CUFF REPAIR Right 2011   TOTAL HIP ARTHROPLASTY Right 06/14/2016   Procedure: TOTAL HIP ARTHROPLASTY;  Surgeon: Donato Heinz, MD;  Location: [facility] ORS;  Service: Orthopedics;  Laterality: Right;    Medical History: Past Medical History:  Diagnosis Date   Anxiety 01/02/2012   Arthritis    Asthma    Blockage of coronary artery  of heart (HCC)    Chronic kidney disease    Kidney stones   Colon polyps    COPD (chronic obstructive pulmonary disease) (HCC)    Diabetes mellitus without complication (HCC)    GERD (gastroesophageal reflux disease)    HOH (hard of hearing)    Bilateral hearing aids   Kidney stone    Osteoporosis    Sleep apnea    Does not use C-PAP on a regular basis   Venous stasis     Family History: Family History  Problem Relation Age of Onset   Breast cancer Maternal Aunt 50   Breast cancer Maternal Aunt    Bladder Cancer Neg Hx    Kidney cancer Neg Hx     Social History   Socioeconomic History   Marital status:  Divorced    Spouse name: Not on file   Number of children: Not on file   Years of education: Not on file   Highest education level: Not on file  Occupational History   Not on file  Tobacco Use   Smoking status: Former    Current packs/day: 0.00    Average packs/day: 1 pack/day for 50.0 years (50.0 ttl pk-yrs)    Types: Cigarettes    Start date: 12/29/1971    Quit date: 12/28/2021    Years since quitting: 1.8    Passive exposure: Never   Smokeless tobacco: Never  Vaping Use   Vaping status: Never Used  Substance and Sexual Activity   Alcohol use: Not Currently    Comment: very rarely    Drug use: No   Sexual activity: Not Currently    Birth control/protection: Post-menopausal  Other Topics Concern   Not on file  Social History Narrative   Not on file   Social Drivers of Health   Financial Resource Strain: Not on file  Food Insecurity: Not on file  Transportation Needs: Not on file  Physical Activity: Insufficiently Active (09/21/2022)   Exercise Vital Sign    Days of Exercise per Week: 3 days    Minutes of Exercise per Session: 30 min  Stress: Not on file  Social Connections: Not on file  Intimate Partner Violence: Not on file      Review of Systems  Constitutional:  Negative for chills, fatigue and unexpected weight change.  HENT:  Positive for  congestion and postnasal drip. Negative for rhinorrhea, sneezing and sore throat.   Eyes:  Negative for redness.  Respiratory:  Positive for cough and shortness of breath. Negative for chest tightness and wheezing.   Cardiovascular:  Positive for leg swelling. Negative for palpitations.  Gastrointestinal:  Positive for diarrhea. Negative for abdominal pain, constipation, nausea and vomiting.  Genitourinary:  Negative for dysuria and frequency.  Musculoskeletal:  Positive for arthralgias. Negative for back pain, joint swelling and neck pain.  Skin:  Negative for rash.  Neurological: Negative.  Negative for tremors.  Hematological:  Negative for adenopathy. Does not bruise/bleed easily.  Psychiatric/Behavioral:  Negative for behavioral problems (Depression), sleep disturbance and suicidal ideas. The patient is not nervous/anxious.     Vital Signs: BP 120/60   Pulse 75   Temp 98.1 F (36.7 C)   Resp 16   Ht 5\' 6"  (1.676 m)   Wt 197 lb 12.8 oz (89.7 kg)   SpO2 93%   BMI 31.93 kg/m    Physical Exam Vitals and nursing note reviewed.  Constitutional:      Appearance: Normal appearance.  HENT:     Head: Normocephalic and atraumatic.  Eyes:     Extraocular Movements: Extraocular movements intact.  Cardiovascular:     Rate and Rhythm: Normal rate and regular rhythm.     Pulses: Normal pulses.     Heart sounds: Normal heart sounds.  Pulmonary:     Effort: Pulmonary effort is normal.     Breath sounds: Normal breath sounds.  Musculoskeletal:     Right lower leg: Edema present.     Left lower leg: Edema present.     Comments: Wearing compression stockings  Skin:    General: Skin is warm and dry.  Neurological:     General: No focal deficit present.     Mental Status: She is alert.  Psychiatric:        Mood and Affect: Mood normal.  Behavior: Behavior normal.        Assessment/Plan: 1. Type 2 diabetes mellitus with hyperglycemia, without long-term current use of  insulin (HCC) (Primary) - POCT HgB A1C is 6.4 which is improved from 6.6 last visit. Continue Marcelline Deist and working on diet.  2. Chronic hypoxemic respiratory failure (HCC) Followed by pulmonology, on oxygen  3. Oxygen dependent Continue oxygen as before  4. Diarrhea, unspecified type Continues to have bouts of diarrhea despite discontinuing metformin and will follow up with GI  5. Acute non-recurrent maxillary sinusitis Will send zpak    General Counseling: Venissa verbalizes understanding of the findings of todays visit and agrees with plan of treatment. I have discussed any further diagnostic evaluation that may be needed or ordered today. We also reviewed her medications today. she has been encouraged to call the office with any questions or concerns that should arise related to todays visit.    Orders Placed This Encounter  Procedures   POCT HgB A1C    Meds ordered this encounter  Medications   azithromycin (ZITHROMAX) 250 MG tablet    Sig: Take 2 tablets on day 1, then 1 tablet daily on days 2 through 5    Dispense:  6 tablet    Refill:  0    This patient was seen by Lynn Ito, PA-C in collaboration with Dr. Beverely Risen as a part of collaborative care agreement.   Total time spent:30 Minutes Time spent includes review of chart, medications, test results, and follow up plan with the patient.      Dr Lyndon Code Internal medicine

## 2023-11-13 DIAGNOSIS — H6123 Impacted cerumen, bilateral: Secondary | ICD-10-CM | POA: Diagnosis not present

## 2023-11-13 DIAGNOSIS — D164 Benign neoplasm of bones of skull and face: Secondary | ICD-10-CM | POA: Diagnosis not present

## 2023-11-13 DIAGNOSIS — H903 Sensorineural hearing loss, bilateral: Secondary | ICD-10-CM | POA: Diagnosis not present

## 2023-11-15 ENCOUNTER — Ambulatory Visit (INDEPENDENT_AMBULATORY_CARE_PROVIDER_SITE_OTHER): Payer: Medicare Other

## 2023-11-15 DIAGNOSIS — E538 Deficiency of other specified B group vitamins: Secondary | ICD-10-CM | POA: Diagnosis not present

## 2023-11-15 MED ORDER — CYANOCOBALAMIN 1000 MCG/ML IJ SOLN
1000.0000 ug | Freq: Once | INTRAMUSCULAR | Status: AC
  Administered 2023-11-15: 1000 ug via INTRAMUSCULAR

## 2023-11-19 ENCOUNTER — Encounter: Payer: Self-pay | Admitting: Cardiology

## 2023-11-19 ENCOUNTER — Ambulatory Visit: Payer: PRIVATE HEALTH INSURANCE | Attending: Cardiology | Admitting: Cardiology

## 2023-11-19 VITALS — BP 124/76 | HR 67 | Ht 66.0 in | Wt 200.4 lb

## 2023-11-19 DIAGNOSIS — R600 Localized edema: Secondary | ICD-10-CM | POA: Diagnosis not present

## 2023-11-19 DIAGNOSIS — E7800 Pure hypercholesterolemia, unspecified: Secondary | ICD-10-CM | POA: Diagnosis not present

## 2023-11-19 DIAGNOSIS — I5032 Chronic diastolic (congestive) heart failure: Secondary | ICD-10-CM | POA: Insufficient documentation

## 2023-11-19 DIAGNOSIS — I2510 Atherosclerotic heart disease of native coronary artery without angina pectoris: Secondary | ICD-10-CM | POA: Diagnosis not present

## 2023-11-19 MED ORDER — TORSEMIDE 40 MG PO TABS: 40.0000 mg | ORAL_TABLET | Freq: Every day | ORAL | 3 refills | Status: DC

## 2023-11-19 MED ORDER — SPIRONOLACTONE 25 MG PO TABS: 25.0000 mg | ORAL_TABLET | Freq: Every day | ORAL | 3 refills | Status: DC

## 2023-11-19 MED ORDER — POTASSIUM CHLORIDE CRYS ER 20 MEQ PO TBCR: 40.0000 meq | EXTENDED_RELEASE_TABLET | Freq: Every day | ORAL | 3 refills | Status: DC

## 2023-11-19 MED ORDER — POTASSIUM CHLORIDE CRYS ER 20 MEQ PO TBCR: 40.0000 meq | EXTENDED_RELEASE_TABLET | Freq: Two times a day (BID) | ORAL | 3 refills | Status: DC

## 2023-11-19 NOTE — Progress Notes (Signed)
 Cardiology Office Note:    Date:  11/19/2023   ID:  Amber Holder, DOB [birth year], MRN [id number]  PCP:  Carlean Jews, PA-C   [facility] Providers Cardiologist:  Debbe Odea, MD     Referring MD: Lyndon Code, MD   No chief complaint on file.   History of Present Illness:    Amber Holder is a 78 y.o. female with a hx of CAD (CTO RCA mild to moderate LCx and LAD dz), hyperlipidemia, diabetes, COPD, former smoker x40+ years, PAD/carotid stenosis, IBS, hard of hearing who presents for follow-up.    Patient states having worsening leg edema after starting Opsumit for PAH.  Was previously on torsemide 20 mg daily with good effect, current dose of torsemide not helping edema.  Endorses shortness of breath with exertion.  Currently requiring oxygen due to COPD.   Prior notes CMR EF 58%, subendocardial scar comprising 75% wall thickness in the LV inferior wall, inferior wall nonfriable. Coronary CTA 10/2021 calcium score 790, CTO proximal RCA, nonobstructive LAD and left circumflex disease Echo 10/2021 EF 60 to 65%, impaired laxation. Did not tolerate higher doses of Lipitor, currently on 20 mg daily.  Outside echocardiogram 2021 showed normal systolic function, diastolic dysfunction EF 60% Chest CT 06/2016 showed coronary artery calcification  Past Medical History:  Diagnosis Date   Anxiety 01/02/2012   Arthritis    Asthma    Blockage of coronary artery of heart (HCC)    Chronic kidney disease    Kidney stones   Colon polyps    COPD (chronic obstructive pulmonary disease) (HCC)    Diabetes mellitus without complication (HCC)    GERD (gastroesophageal reflux disease)    HOH (hard of hearing)    Bilateral hearing aids   Kidney stone    Osteoporosis    Sleep apnea    Does not use C-PAP on a regular basis   Venous stasis     Past Surgical History:  Procedure Laterality Date   APPENDECTOMY     cataract surgery Bilateral    CHOLECYSTECTOMY     COLONOSCOPY      COLONOSCOPY WITH PROPOFOL N/A 05/28/2015   Procedure: COLONOSCOPY WITH PROPOFOL;  Surgeon: Christena Deem, MD;  Location: [facility] ENDOSCOPY;  Service: Endoscopy;  Laterality: N/A;   COLONOSCOPY WITH PROPOFOL N/A 10/28/2018   Procedure: COLONOSCOPY WITH PROPOFOL;  Surgeon: Christena Deem, MD;  Location: [facility] ENDOSCOPY;  Service: Endoscopy;  Laterality: N/A;   EYE SURGERY Bilateral 2015   Cataract Extraction with IOL   LITHOTRIPSY  2015   has had many stones   Mastoidotomy  1970   ROTATOR CUFF REPAIR Right 2011   TOTAL HIP ARTHROPLASTY Right 06/14/2016   Procedure: TOTAL HIP ARTHROPLASTY;  Surgeon: Donato Heinz, MD;  Location: [facility] ORS;  Service: Orthopedics;  Laterality: Right;    Current Medications: Current Meds  Medication Sig   acetaminophen (TYLENOL) 500 MG chewable tablet Chew 1,000 mg by mouth every 8 (eight) hours as needed for pain.   albuterol (VENTOLIN HFA) 108 (90 Base) MCG/ACT inhaler Inhale 2 puffs into the lungs every 6 (six) hours as needed for wheezing or shortness of breath.   aspirin EC 81 MG tablet Take 1 tablet (81 mg total) by mouth daily. Swallow whole.   atorvastatin (LIPITOR) 40 MG tablet Take 1 tablet (40 mg total) by mouth daily.   cholecalciferol (VITAMIN D) 400 UNITS TABS tablet Take 2,000 Units by mouth daily.   CRANBERRY PO Take 25,000 mg by  mouth daily.   diclofenac Sodium (VOLTAREN) 1 % GEL Apply small amount to area of pain daily as needed   ergocalciferol (DRISDOL) 1.25 MG (50000 UT) capsule Take one cap q week   estradiol (ESTRACE) 0.1 MG/GM vaginal cream Estrogen Cream Instruction Discard applicator Apply pea sized amount to tip of finger to urethra before bed. Wash hands well after application. Use Monday, Wednesday and Friday   FARXIGA 10 MG TABS tablet TAKE 1 TABLET BY MOUTH DAILY BEFORE BREAKFAST.   fluconazole (DIFLUCAN) 150 MG tablet Take one tab po q week for fungal infection and as needed   Fluticasone-Umeclidin-Vilant (TRELEGY ELLIPTA)  100-62.5-25 MCG/ACT AEPB Use inhale  orally daily   glucose blood (ONETOUCH VERIO) test strip BLOOD SUGAR TESTING ONCE DAILY . DX E11.65   hydrocortisone cream 0.5 % Apply topically.   ibandronate (BONIVA) 150 MG tablet TAKE 1 TABLET (150 MG TOTAL) BY MOUTH EVERY 30 (THIRTY) DAYS.   Lancets (ONETOUCH DELICA PLUS LANCET30G) MISC Use  as directed twice a daily DX E11.65   macitentan (OPSUMIT) 10 MG tablet Take 1 tablet (10 mg total) by mouth daily.   Magnesium 250 MG TABS Take by mouth. Takes 1 tablet 2-3 times per week   montelukast (SINGULAIR) 10 MG tablet Take 1 tablet (10 mg total) by mouth daily as needed.   Multiple Vitamins-Minerals (MULTIVITAMIN ADULTS PO) Take 1 tablet by mouth daily.   omeprazole (PRILOSEC) 40 MG capsule Take 1 capsule (40 mg total) by mouth daily. (Patient taking differently: Take 40 mg by mouth as needed.)   OXYGEN Inhale 3 L into the lungs. Pt uses [facility] for Oxygen   predniSONE (DELTASONE) 10 MG tablet Take 1 tab po 3 x day for 3 days then take 1 tab po 2 x a day for 3 days and then take 1 tab po daily for 3 days   Simethicone 80 MG TABS Take 1 tablet (80 mg total) by mouth as needed (burping). As directed. (Patient taking differently: Take 1 tablet by mouth as needed (burping).)   spironolactone (ALDACTONE) 25 MG tablet Take 1 tablet (25 mg total) by mouth daily.   Torsemide 40 MG TABS Take 40 mg by mouth daily.   [DISCONTINUED] KLOR-CON M20 20 MEQ tablet TAKE 2 TABLETS EVERY MORNING AND 1 TABLET AT NIGHT AND THEN MAY TAKE ONE EXTRA PER DOCTOR DIRECTION. DOSE INCREASE   [DISCONTINUED] potassium chloride SA (KLOR-CON M) 20 MEQ tablet Take 2 tablets (40 mEq total) by mouth 2 (two) times daily.   [DISCONTINUED] torsemide (DEMADEX) 20 MG tablet Take 1 tablet (20 mg total) by mouth daily. May take an extra 20 mg as needed for swelling   Current Facility-Administered Medications for the 11/19/23 encounter (Office Visit) with Debbe Odea, MD  Medication    nitrofurantoin (macrocrystal-monohydrate) (MACROBID) capsule 100 mg     Allergies:   Iodinated contrast media, Shellfish allergy, Sulfa antibiotics, Sulfasalazine, Percocet [oxycodone-acetaminophen], and Iodine   Social History   Socioeconomic History   Marital status: Divorced    Spouse name: Not on file   Number of children: Not on file   Years of education: Not on file   Highest education level: Not on file  Occupational History   Not on file  Tobacco Use   Smoking status: Former    Current packs/day: 0.00    Average packs/day: 1 pack/day for 50.0 years (50.0 ttl pk-yrs)    Types: Cigarettes    Start date: 12/29/1971    Quit date: 12/28/2021  Years since quitting: 1.8    Passive exposure: Never   Smokeless tobacco: Never  Vaping Use   Vaping status: Never Used  Substance and Sexual Activity   Alcohol use: Not Currently    Comment: very rarely    Drug use: No   Sexual activity: Not Currently    Birth control/protection: Post-menopausal  Other Topics Concern   Not on file  Social History Narrative   Not on file   Social Drivers of Health   Financial Resource Strain: Not on file  Food Insecurity: Not on file  Transportation Needs: Not on file  Physical Activity: Insufficiently Active (09/21/2022)   Exercise Vital Sign    Days of Exercise per Week: 3 days    Minutes of Exercise per Session: 30 min  Stress: Not on file  Social Connections: Not on file     Family History: The patient's family history includes Breast cancer in her maternal aunt; Breast cancer (age of onset: 77) in her maternal aunt. There is no history of Bladder Cancer or Kidney cancer.  ROS:   Please see the history of present illness.     All other systems reviewed and are negative.  EKGs/Labs/Other Studies Reviewed:    The following studies were reviewed today:   EKG Interpretation Date/Time:  Monday November 19 2023 10:15:20 EDT Ventricular Rate:  67 PR Interval:  194 QRS  Duration:  92 QT Interval:  418 QTC Calculation: 441 R Axis:   56  Text Interpretation: Normal sinus rhythm Low voltage QRS Confirmed by Debbe Odea ([id number]) on 11/19/2023 10:38:46 AM    Recent Labs: 06/11/2023: ALT 19; BUN 23; Creatinine, Ser 0.76; Hemoglobin 14.5; Platelets 214; Potassium 4.3; Sodium 138; TSH 1.797  Recent Lipid Panel    Component Value Date/Time   CHOL 169 06/11/2023 1032   CHOL 239 (H) 12/24/2020 1154   TRIG 79 06/11/2023 1032   HDL 57 06/11/2023 1032   HDL 55 12/24/2020 1154   CHOLHDL 3.0 06/11/2023 1032   VLDL 16 06/11/2023 1032   LDLCALC 96 06/11/2023 1032   LDLCALC 168 (H) 12/24/2020 1154   LDLDIRECT 100 (H) 06/11/2023 1032     Risk Assessment/Calculations:          Physical Exam:    VS:  BP 124/76   Pulse 67   Ht 5\' 6"  (1.676 m)   Wt 200 lb 6.4 oz (90.9 kg)   SpO2 93%   BMI 32.35 kg/m     Wt Readings from Last 3 Encounters:  11/19/23 200 lb 6.4 oz (90.9 kg)  11/01/23 197 lb 12.8 oz (89.7 kg)  09/17/23 199 lb 12.8 oz (90.6 kg)     GEN:  Well nourished, well developed in no acute distress HEENT: Normal NECK: No JVD; No carotid bruits CARDIAC: RRR, no murmurs, rubs, gallops RESPIRATORY: Decreased breath sounds, no wheezing. ABDOMEN: Soft, non-tender, non-distended MUSCULOSKELETAL:  2+ edema. SKIN: Warm and dry NEUROLOGIC:  Alert and oriented x 3 PSYCHIATRIC:  Normal affect   ASSESSMENT:    1. Coronary artery disease involving native coronary artery of native heart, unspecified whether angina present   2. Pure hypercholesterolemia   3. Leg edema   4. Chronic diastolic congestive heart failure (HCC)    PLAN:    In order of problems listed above:  CAD, CTO RCA.  Calcium score 790.  CMR 10/23 showed inferior LV wall nonviable.  EF 58%.  Denies chest pain.  Continue aspirin 81 mg daily, Zetia, Lipitor 20.  Echo EF  60 to 65%, impaired relaxation.  No plans for left heart cath, as patient has CTO, patient also has iodine  allergies. Hyperlipidemia, Zetia 10 mg daily.  Lipitor 20 mg daily.  Not tolerant to higher dose of Lipitor.  Leg edema bilaterally, worsening with taking Opsumit.  Increase torsemide to 40 mg daily, start Aldactone.  KCl 40 mEq daily.  Check BMP in 10 days.  Follow-up in 6 to 8 weeks.  Medication Adjustments/Labs and Tests Ordered: Current medicines are reviewed at length with the patient today.  Concerns regarding medicines are outlined above.  Orders Placed This Encounter  Procedures   Basic Metabolic Panel (BMET)   EKG 12-Lead   Meds ordered this encounter  Medications   Torsemide 40 MG TABS    Sig: Take 40 mg by mouth daily.    Dispense:  90 tablet    Refill:  3   spironolactone (ALDACTONE) 25 MG tablet    Sig: Take 1 tablet (25 mg total) by mouth daily.    Dispense:  90 tablet    Refill:  3   DISCONTD: potassium chloride SA (KLOR-CON M) 20 MEQ tablet    Sig: Take 2 tablets (40 mEq total) by mouth 2 (two) times daily.    Dispense:  180 tablet    Refill:  3   potassium chloride SA (KLOR-CON M) 20 MEQ tablet    Sig: Take 2 tablets (40 mEq total) by mouth daily.    Dispense:  180 tablet    Refill:  3    Patient Instructions  Medication Instructions:  Your physician has recommended you make the following change in your medication:   START: Torsemide 40 mg daily START: Potasium chloride 40 mg two tablets daily START: Spirinolactone 25 mg daily  *If you need a refill on your cardiac medications before your next appointment, please call your pharmacy*   Lab Work: Your physician recommends that you return for lab work in:   Labs in 10 days: BMP  If you have labs (blood work) drawn today and your tests are completely normal, you will receive your results only by: MyChart Message (if you have MyChart) OR A paper copy in the mail If you have any lab test that is abnormal or we need to change your treatment, we will call you to review the  results.   Testing/Procedures: None   Follow-Up: At [facility], you and your health needs are our priority.  As part of our continuing mission to provide you with exceptional heart care, we have created designated Provider Care Teams.  These Care Teams include your primary Cardiologist (physician) and Advanced Practice Providers (APPs -  Physician Assistants and Nurse Practitioners) who all work together to provide you with the care you need, when you need it.  We recommend signing up for the patient portal called "MyChart".  Sign up information is provided on this After Visit Summary.  MyChart is used to connect with patients for Virtual Visits (Telemedicine).  Patients are able to view lab/test results, encounter notes, upcoming appointments, etc.  Non-urgent messages can be sent to your provider as well.   To learn more about what you can do with MyChart, go to [email or web address].    Your next appointment:   8 week(s)  Provider:   Dr. Myriam Forehand - Sandie Ano  Other Instructions None       Signed, Debbe Odea, MD  11/19/2023 12:24 PM    [facility]

## 2023-11-19 NOTE — Patient Instructions (Signed)
 Medication Instructions:  Your physician has recommended you make the following change in your medication:   START: Torsemide 40 mg daily START: Potasium chloride 40 mg two tablets daily START: Spirinolactone 25 mg daily  *If you need a refill on your cardiac medications before your next appointment, please call your pharmacy*   Lab Work: Your physician recommends that you return for lab work in:   Labs in 10 days: BMP  If you have labs (blood work) drawn today and your tests are completely normal, you will receive your results only by: MyChart Message (if you have MyChart) OR A paper copy in the mail If you have any lab test that is abnormal or we need to change your treatment, we will call you to review the results.   Testing/Procedures: None   Follow-Up: At [facility], you and your health needs are our priority.  As part of our continuing mission to provide you with exceptional heart care, we have created designated Provider Care Teams.  These Care Teams include your primary Cardiologist (physician) and Advanced Practice Providers (APPs -  Physician Assistants and Nurse Practitioners) who all work together to provide you with the care you need, when you need it.  We recommend signing up for the patient portal called "MyChart".  Sign up information is provided on this After Visit Summary.  MyChart is used to connect with patients for Virtual Visits (Telemedicine).  Patients are able to view lab/test results, encounter notes, upcoming appointments, etc.  Non-urgent messages can be sent to your provider as well.   To learn more about what you can do with MyChart, go to [email or web address].    Your next appointment:   8 week(s)  Provider:   Dr. Myriam Forehand - Sandie Ano  Other Instructions None

## 2023-11-29 ENCOUNTER — Encounter: Payer: Self-pay | Admitting: Urology

## 2023-11-29 ENCOUNTER — Ambulatory Visit (INDEPENDENT_AMBULATORY_CARE_PROVIDER_SITE_OTHER): Payer: Self-pay | Admitting: Urology

## 2023-11-29 VITALS — BP 105/64 | HR 86 | Ht 66.0 in | Wt 191.5 lb

## 2023-11-29 DIAGNOSIS — N200 Calculus of kidney: Secondary | ICD-10-CM

## 2023-11-29 DIAGNOSIS — N390 Urinary tract infection, site not specified: Secondary | ICD-10-CM | POA: Diagnosis not present

## 2023-11-29 MED ORDER — ESTRADIOL 0.1 MG/GM VA CREA: TOPICAL_CREAM | VAGINAL | 12 refills | Status: DC

## 2023-11-29 NOTE — Progress Notes (Signed)
   11/29/2023 3:08 PM   Amber Holder 06-Apr-1946 [id number]  Reason for visit: Follow up recurrent UTI, nephrolithiasis  HPI: D76-year-old comorbid female previously followed by Dr. Achilles Dunk.  She previously was on long-term nitrofurantoin prophylaxis, and she initially did well on cranberry tablet prophylaxis and stopping the nitrofurantoin.  After having increased UTIs in 2024 she opted to start topical estrogen cream in October 2024.  She has had only 1 UTI since that time shortly after in November 2024, and that was a low colony count of E. coli.  She denies any significant urinary symptoms today.  She has baseline urinary frequency on torsemide.  She also has a history of nephrolithiasis, most recent KUB from October 2024 showed no evidence of stones.  -Continue topical estrogen cream 3 times weekly, cranberry tablets -She request KUB in 6 months to evaluate for any new stone formation -RTC 1 year   Sondra Come, MD  [facility] [address], [address] [place], [place] [address] [phone]

## 2023-12-03 DIAGNOSIS — E7800 Pure hypercholesterolemia, unspecified: Secondary | ICD-10-CM | POA: Diagnosis not present

## 2023-12-03 DIAGNOSIS — I2510 Atherosclerotic heart disease of native coronary artery without angina pectoris: Secondary | ICD-10-CM | POA: Diagnosis not present

## 2023-12-03 DIAGNOSIS — I5032 Chronic diastolic (congestive) heart failure: Secondary | ICD-10-CM | POA: Diagnosis not present

## 2023-12-03 DIAGNOSIS — R600 Localized edema: Secondary | ICD-10-CM | POA: Diagnosis not present

## 2023-12-04 LAB — BASIC METABOLIC PANEL WITH GFR
BUN/Creatinine Ratio: 20 (ref 12–28)
BUN: 17 mg/dL (ref 8–27)
CO2: 22 mmol/L (ref 20–29)
Calcium: 9.8 mg/dL (ref 8.7–10.3)
Chloride: 101 mmol/L (ref 96–106)
Creatinine, Ser: 0.86 mg/dL (ref 0.57–1.00)
Glucose: 107 mg/dL — ABNORMAL HIGH (ref 70–99)
Potassium: 4.3 mmol/L (ref 3.5–5.2)
Sodium: 140 mmol/L (ref 134–144)
eGFR: 69 mL/min/{1.73_m2} (ref >59)

## 2023-12-13 ENCOUNTER — Ambulatory Visit: Payer: Medicare Other

## 2023-12-13 DIAGNOSIS — E538 Deficiency of other specified B group vitamins: Secondary | ICD-10-CM

## 2023-12-13 MED ORDER — CYANOCOBALAMIN 1000 MCG/ML IJ SOLN
1000.0000 ug | Freq: Once | INTRAMUSCULAR | Status: AC
  Administered 2023-12-13: 1000 ug via INTRAMUSCULAR

## 2023-12-17 ENCOUNTER — Other Ambulatory Visit: Payer: Self-pay | Admitting: Physician Assistant

## 2023-12-18 ENCOUNTER — Other Ambulatory Visit: Payer: Self-pay

## 2024-01-07 ENCOUNTER — Ambulatory Visit: Payer: Medicare Other | Admitting: Internal Medicine

## 2024-01-07 ENCOUNTER — Encounter: Payer: Self-pay | Admitting: Internal Medicine

## 2024-01-07 VITALS — BP 120/70 | HR 87 | Temp 97.8°F | Resp 16 | Ht 66.0 in | Wt 191.0 lb

## 2024-01-07 DIAGNOSIS — Z9981 Dependence on supplemental oxygen: Secondary | ICD-10-CM

## 2024-01-07 DIAGNOSIS — J4489 Other specified chronic obstructive pulmonary disease: Secondary | ICD-10-CM | POA: Diagnosis not present

## 2024-01-07 DIAGNOSIS — J9611 Chronic respiratory failure with hypoxia: Secondary | ICD-10-CM

## 2024-01-07 DIAGNOSIS — I2721 Secondary pulmonary arterial hypertension: Secondary | ICD-10-CM

## 2024-01-07 MED ORDER — OPSUMIT 10 MG PO TABS: 10.0000 mg | ORAL_TABLET | Freq: Every day | ORAL | 5 refills | Status: AC

## 2024-01-07 NOTE — Patient Instructions (Signed)
Pulmonary Hypertension Pulmonary hypertension is a condition that causes high blood pressure in the blood vessels of your lungs. This makes your heart work extra hard to pump blood to your lungs. And when your heart has to work harder, it can be harder for you to breathe. Over time, this can hurt and weaken your heart. What are the causes? This condition can be put into one of five groups based on what causes it. Group 1 happens when the blood vessels that carry blood to your lungs get too thick or stiff. This may happen with no known cause or it may be: Inherited. This means it's passed from parent to child. Caused by another disease, such as a disease of the heart or liver. Caused by some medicines or poisons. Group 2 happens if you have problems with the valves in your heart or if the left side of your heart, also called your left ventricle, gets weak. Group 3 can be caused by long-term diseases of the lungs, such as chronic obstructive pulmonary disease or COPD. This can also happen if you don't get enough oxygen, such as if you have trouble breathing when you sleep or if you live at a high altitude. Group 4 is caused by blood clots in your lungs. Group 5 includes other causes, such as sickle cell anemia or growths of cells that aren't normal called tumors. What are the signs or symptoms? Symptoms may include: Shortness of breath. A cough. Tiredness. Feeling dizzy or light-headed. You may also faint. A fast heart beat. You may also feel your heart flutter or skip a beat. Your lips or fingers turning blue. Chest pain or tightness. How is this diagnosed? This condition may be diagnosed with: Blood tests. Imaging tests. These may include: Chest X-rays. CT scans. An echocardiogram. This is an ultrasound of your heart. A ventilation-perfusion scan. This sees how well air and blood flow in and out of your lungs. A pulmonary function test. This looks at how much air your lungs can  hold. A 6-minute walk test. This may be done to check your breathing during exercise. Cardiac catheterization. This is a procedure that uses a soft tube called a catheter to check the arteries of your heart. A lung biopsy. This is when a small piece of tissue is removed from your lung for testing. How is this treated? There's no cure for this condition. But treatment can help you feel better and can slow the condition down. You may need: Oxygen therapy. Cardiac rehabilitation, or rehab. This is a program that teaches you how to: Care for your heart. Exercise. Go back to your normal activities. Medicines to: Lower your blood pressure. Relax the blood vessels in your lungs. Help your heart pump more blood. Help your body get rid of extra fluid. Thin your blood. This can stop you from getting blood clots. Lung surgery. This can relieve pressure on your heart. You may need this if other treatments don't work. Heart-lung or lung transplant. This may be done in very severe cases. Follow these instructions at home: Eating and drinking  Eat a healthy diet. Eat lots of fresh fruits and vegetables, whole grains, and beans. Limit how much salt you take in to less than 2,300 mg a day. Salt is also called sodium. Activity Get lots of rest. Exercise as told. Ask your health care provider what types of exercise are safe for you. Lifestyle Do not use any products that contain nicotine or tobacco. These products include cigarettes, chewing  tobacco, and vaping devices, such as e-cigarettes. If you need help quitting, ask your provider. Stay away when other people smoke. Do not sit in hot tubs or saunas for long stretches of time. Do not get pregnant. If needed, talk with your provider about birth control. Avoid high altitudes. It can be stressful to live with pulmonary hypertension. Talk with your provider about finding support groups and online help. General instructions Take over-the-counter and  prescription medicines only as told by your provider. Do not change or stop medicines without talking with your provider. Stay up to date on your shots, such as your yearly flu shot and pneumonia shot. Use oxygen therapy at home as told. Keep all follow-up visits. Your provider will check your breathing and make changes to your medicines as needed. Contact a health care provider if: Your cough gets worse. You have more shortness of breath. You start to have trouble doing things you could do before. You need to use more medicines or oxygen, or you need to use them more often than normal. Get help right away if: You have severe shortness of breath. You have chest pain or pressure. You cough up blood. These symptoms may be an emergency. Get help right away. Call 911. Do not wait to see if the symptoms will go away. Do not drive yourself to the hospital. This information is not intended to replace advice given to you by your health care provider. Make sure you discuss any questions you have with your health care provider. Document Revised: 10/30/2022 Document Reviewed: 10/30/2022 Elsevier Patient Education  2024 [employer].

## 2024-01-07 NOTE — Progress Notes (Signed)
 [facility] [address] [place], [place] [address]  Pulmonary Sleep Medicine   Office Visit Note  Patient Name: Amber Holder DOB: [birth year] MRN [id number]  Date of Service: 01/07/2024  Complaints/HPI: She states she feels a little better. She states she feels the opsimut is helping her. She had some questions regarding prognosis and I was able to answer for her. She has noted a little less shortness of breath. She states she does feel occasional shortness of breath. Some edema is noted. She has been having her meds adjusted by her cardiologist.  Office Spirometry Results:     ROS  General: (-) fever, (-) chills, (-) night sweats, (-) weakness Skin: (-) rashes, (-) itching,. Eyes: (-) visual changes, (-) redness, (-) itching. Nose and Sinuses: (-) nasal stuffiness or itchiness, (-) postnasal drip, (-) nosebleeds, (-) sinus trouble. Mouth and Throat: (-) sore throat, (-) hoarseness. Neck: (-) swollen glands, (-) enlarged thyroid , (-) neck pain. Respiratory: - cough, (-) bloody sputum, + shortness of breath, - wheezing. Cardiovascular: + ankle swelling, (-) chest pain. Lymphatic: (-) lymph node enlargement. Neurologic: (-) numbness, (-) tingling. Psychiatric: (-) anxiety, (-) depression   Current Medication: Outpatient Encounter Medications as of 01/07/2024  Medication Sig   acetaminophen  (TYLENOL ) 500 MG chewable tablet Chew 1,000 mg by mouth every 8 (eight) hours as needed for pain.   albuterol  (VENTOLIN  HFA) 108 (90 Base) MCG/ACT inhaler Inhale 2 puffs into the lungs every 6 (six) hours as needed for wheezing or shortness of breath.   aspirin  EC 81 MG tablet Take 1 tablet (81 mg total) by mouth daily. Swallow whole.   atorvastatin  (LIPITOR) 40 MG tablet Take 1 tablet (40 mg total) by mouth daily.   cholecalciferol  (VITAMIN D ) 400 UNITS TABS tablet Take 2,000 Units by mouth daily.   CRANBERRY PO Take 25,000 mg by mouth daily.   diclofenac  Sodium (VOLTAREN ) 1  % GEL Apply small amount to area of pain daily as needed   ergocalciferol  (DRISDOL ) 1.25 MG (50000 UT) capsule Take one cap q week   estradiol  (ESTRACE ) 0.1 MG/GM vaginal cream Estrogen Cream Instruction Discard applicator Apply pea sized amount to tip of finger to urethra before bed. Wash hands well after application. Use Monday, Wednesday and Friday   FARXIGA  10 MG TABS tablet TAKE 1 TABLET BY MOUTH DAILY BEFORE BREAKFAST.   fluconazole  (DIFLUCAN ) 150 MG tablet Take one tab po q week for fungal infection and as needed   Fluticasone -Umeclidin-Vilant (TRELEGY ELLIPTA ) 100-62.5-25 MCG/ACT AEPB Use inhale  orally daily   glucose blood (ONETOUCH VERIO) test strip BLOOD SUGAR TESTING ONCE DAILY . DX E11.65   hydrocortisone  cream 0.5 % Apply topically as needed.   ibandronate  (BONIVA ) 150 MG tablet TAKE 1 TABLET (150 MG TOTAL) BY MOUTH EVERY 30 (THIRTY) DAYS.   ketoconazole (NIZORAL) 2 % cream Apply 1 Application topically daily.   Lancets (ONETOUCH DELICA PLUS LANCET30G) MISC Use  as directed twice a daily DX E11.65   Magnesium  250 MG TABS Take by mouth. Takes 1 tablet 2-3 times per week   montelukast  (SINGULAIR ) 10 MG tablet Take 1 tablet (10 mg total) by mouth daily as needed.   Multiple Vitamins-Minerals (MULTIVITAMIN ADULTS PO) Take 1 tablet by mouth daily.   omeprazole  (PRILOSEC) 40 MG capsule Take 1 capsule (40 mg total) by mouth daily. (Patient taking differently: Take 40 mg by mouth as needed.)   OXYGEN  Inhale 3 L into the lungs. Pt uses [facility] for Oxygen    potassium  chloride SA (KLOR-CON  M) 20 MEQ tablet Take 2 tablets (40 mEq total) by mouth daily.   spironolactone  (ALDACTONE ) 25 MG tablet Take 1 tablet (25 mg total) by mouth daily.   Torsemide  40 MG TABS Take 40 mg by mouth daily.   Facility-Administered Encounter Medications as of 01/07/2024  Medication   nitrofurantoin  (macrocrystal-monohydrate) (MACROBID ) capsule 100 mg    Surgical History: Past Surgical History:   Procedure Laterality Date   APPENDECTOMY     cataract surgery Bilateral    CHOLECYSTECTOMY     COLONOSCOPY     COLONOSCOPY WITH PROPOFOL  N/A 05/28/2015   Procedure: COLONOSCOPY WITH PROPOFOL ;  Surgeon: Deveron Fly, MD;  Location: [facility] ENDOSCOPY;  Service: Endoscopy;  Laterality: N/A;   COLONOSCOPY WITH PROPOFOL  N/A 10/28/2018   Procedure: COLONOSCOPY WITH PROPOFOL ;  Surgeon: Deveron Fly, MD;  Location: [facility] ENDOSCOPY;  Service: Endoscopy;  Laterality: N/A;   EYE SURGERY Bilateral 2015   Cataract Extraction with IOL   LITHOTRIPSY  2015   has had many stones   Mastoidotomy  1970   ROTATOR CUFF REPAIR Right 2011   TOTAL HIP ARTHROPLASTY Right 06/14/2016   Procedure: TOTAL HIP ARTHROPLASTY;  Surgeon: Arlyne Lame, MD;  Location: [facility] ORS;  Service: Orthopedics;  Laterality: Right;    Medical History: Past Medical History:  Diagnosis Date   Anxiety 01/02/2012   Arthritis    Asthma    Blockage of coronary artery of heart (HCC)    Chronic kidney disease    Kidney stones   Colon polyps    COPD (chronic obstructive pulmonary disease) (HCC)    Diabetes mellitus without complication (HCC)    GERD (gastroesophageal reflux disease)    HOH (hard of hearing)    Bilateral hearing aids   Kidney stone    Osteoporosis    Sleep apnea    Does not use C-PAP on a regular basis   Urinary tract infection    Venous stasis     Family History: Family History  Problem Relation Age of Onset   Breast cancer Maternal Aunt 50   Breast cancer Maternal Aunt    Bladder Cancer Neg Hx    Kidney cancer Neg Hx     Social History: Social History   Socioeconomic History   Marital status: Divorced    Spouse name: Not on file   Number of children: Not on file   Years of education: Not on file   Highest education level: Not on file  Occupational History   Not on file  Tobacco Use   Smoking status: Former    Current packs/day: 0.00    Average packs/day: 1 pack/day for 50.0 years  (50.0 ttl pk-yrs)    Types: Cigarettes    Start date: 12/29/1971    Quit date: 12/28/2021    Years since quitting: 2.0    Passive exposure: Never   Smokeless tobacco: Never  Vaping Use   Vaping status: Never Used  Substance and Sexual Activity   Alcohol  use: Not Currently    Comment: very rarely    Drug use: No   Sexual activity: Not Currently    Birth control/protection: Post-menopausal  Other Topics Concern   Not on file  Social History Narrative   Not on file   Social Drivers of Health   Financial Resource Strain: Not on file  Food Insecurity: Not on file  Transportation Needs: Not on file  Physical Activity: Insufficiently Active (09/21/2022)   Exercise Vital Sign    Days  of Exercise per Week: 3 days    Minutes of Exercise per Session: 30 min  Stress: Not on file  Social Connections: Not on file  Intimate Partner Violence: Not on file    Vital Signs: Blood pressure 120/70, pulse 87, temperature 97.8 F (36.6 C), resp. rate 16, height 5\' 6"  (1.676 m), weight 191 lb (86.6 kg), SpO2 90%.  Examination: General Appearance: The patient is well-developed, well-nourished, and in no distress. Skin: Gross inspection of skin unremarkable. Head: normocephalic, no gross deformities. Eyes: no gross deformities noted. ENT: ears appear grossly normal no exudates. Neck: Supple. No thyromegaly. No LAD. Respiratory: no rhonchi noted. Cardiovascular: Normal S1 and S2 without murmur or rub. Extremities: No cyanosis. pulses are equal. Neurologic: Alert and oriented. No involuntary movements.  LABS: Recent Results (from the past 2160 hours)  POCT HgB A1C     Status: Abnormal   Collection Time: 11/01/23 10:36 AM  Result Value Ref Range   Hemoglobin A1C 6.4 (A) 4.0 - 5.6 %   HbA1c POC (<> result, manual entry)     HbA1c, POC (prediabetic range)     HbA1c, POC (controlled diabetic range)    Basic Metabolic Panel (BMET)     Status: Abnormal   Collection Time: 12/03/23  2:12 PM   Result Value Ref Range   Glucose 107 (H) 70 - 99 mg/dL   BUN 17 8 - 27 mg/dL   Creatinine, Ser 1.61 0.57 - 1.00 mg/dL   eGFR 69 >09 UE/AVW/0.98   BUN/Creatinine Ratio 20 12 - 28   Sodium 140 134 - 144 mmol/L   Potassium 4.3 3.5 - 5.2 mmol/L   Chloride 101 96 - 106 mmol/L   CO2 22 20 - 29 mmol/L   Calcium  9.8 8.7 - 10.3 mg/dL    Radiology: CT CHEST LUNG CA SCREEN LOW DOSE W/O CM Result Date: 07/05/2023 CLINICAL DATA:  50 pack-year smoking history/quit last year EXAM: CT CHEST WITHOUT CONTRAST LOW-DOSE FOR LUNG CANCER SCREENING TECHNIQUE: Multidetector CT imaging of the chest was performed following the standard protocol without IV contrast. RADIATION DOSE REDUCTION: This exam was performed according to the departmental dose-optimization program which includes automated exposure control, adjustment of the mA and/or kV according to patient size and/or use of iterative reconstruction technique. COMPARISON:  12/20/2021 diagnostic CT. FINDINGS: Cardiovascular: Aortic atherosclerosis. Tortuous thoracic aorta. Mild cardiomegaly, without pericardial effusion. Three vessel coronary artery calcification. Pulmonary artery enlargement, outflow tract 3.4 cm. Mediastinum/Nodes: No mediastinal or hilar adenopathy, given limitations of unenhanced CT. Lungs/Pleura: No pleural fluid. Moderate centrilobular emphysema. Right lower lobe calcified granuloma of 4.3 mm. Upper Abdomen: Mild hepatic steatosis. Normal imaged portions of the spleen, stomach, pancreas, adrenal glands, left kidney. Musculoskeletal: Right rotator cuff repair. Mild osteopenia. Accentuation of expected thoracic kyphosis. IMPRESSION: 1. Lung-RADS 1, negative. Continue annual screening with low-dose chest CT without contrast in 12 months. 2. Pulmonary artery enlargement suggests pulmonary arterial hypertension. 3. Hepatic steatosis. 4. Aortic Atherosclerosis (ICD10-I70.0) and Emphysema (ICD10-J43.9). Coronary artery atherosclerosis. Electronically  Signed   By: Lore Rode M.D.   On: 07/05/2023 10:18    No results found.  No results found.  Assessment and Plan: Patient Active Problem List   Diagnosis Date Noted   Varicose veins of leg with swelling, bilateral 06/09/2022   Hypokalemia 09/12/2020   Chronic venous stasis 01/11/2020   Atopic dermatitis 01/11/2020   Diastolic dysfunction 09/30/2019   Peripheral edema 09/10/2019   Dyspnea on exertion 09/10/2019   Muscle cramps 09/10/2019   Type 2  diabetes mellitus with hyperglycemia (HCC) 05/18/2019   Seasonal allergic rhinitis 05/18/2019   Screening for breast cancer 05/18/2019   Stenosis of left carotid artery 02/05/2019   Acute upper respiratory infection 10/09/2018   Flu-like symptoms 10/09/2018   Sore throat 10/09/2018   Vitamin D  deficiency 07/27/2018   Need for vaccination against Streptococcus pneumoniae using pneumococcal conjugate vaccine 7 07/27/2018   Encounter for general adult medical examination with abnormal findings 04/07/2018   Primary generalized (osteo)arthritis 04/07/2018   Hematuria, microscopic 12/18/2017   Renal colic 12/18/2017   Urinary tract infection without hematuria 11/21/2017   Dysuria 11/21/2017   Uncontrolled type 2 diabetes mellitus with hyperglycemia (HCC) 11/21/2017   Diabetes mellitus type 2, uncomplicated (HCC) 10/04/2017   Osteoporosis 10/04/2017   S/P total hip arthroplasty 06/14/2016   Inguinal hernia, left 06/09/2013   Umbilical hernia 06/09/2013   Increased frequency of urination 11/27/2012   Chronic cystitis 11/19/2012   Incomplete emptying of bladder 11/19/2012   Medullary sponge kidney 11/19/2012   Mixed urge and stress incontinence 11/19/2012   Anxiety 01/02/2012   Calculus of kidney 01/02/2012   Chronic obstructive pulmonary disease (HCC) 01/02/2012   GERD (gastroesophageal reflux disease) 01/02/2012   Hearing loss 01/02/2012   Mixed hyperlipidemia 01/02/2012   Obesity, unspecified 01/02/2012   OSA on CPAP 01/02/2012    Tobacco abuse 01/02/2012   Venous insufficiency 01/02/2012    1. Chronic hypoxemic respiratory failure (HCC) (Primary)  Will get routine lab work check her liver panel and metabolic panel continue with current medical management she does start to seem to start having some benefit from medical therapy - Basic Metabolic Panel (BMET) - Hepatic function panel - CBC with Differential/Platelet  2. Oxygen  dependent  Encouraged her to stay compliant with her oxygen  she is doing well saturations are maintaining  3. PAH (pulmonary artery hypertension) (HCC)  She is on therapy now for pulmonary hypertension and she states that she has started to note that there was some improvement in her daytime symptoms  4. Obstructive chronic bronchitis without exacerbation (HCC)  Will continue with the inhaler regimen.   General Counseling: I have discussed the findings of the evaluation and examination with Enedina.  I have also discussed any further diagnostic evaluation thatmay be needed or ordered today. Janai verbalizes understanding of the findings of todays visit. We also reviewed her medications today and discussed drug interactions and side effects including but not limited excessive drowsiness and altered mental states. We also discussed that there is always a risk not just to her but also people around her. she has been encouraged to call the office with any questions or concerns that should arise related to todays visit.  Orders Placed This Encounter  Procedures   Basic Metabolic Panel (BMET)   Hepatic function panel   CBC with Differential/Platelet     Time spent: 68  I have personally obtained a history, examined the patient, evaluated laboratory and imaging results, formulated the assessment and plan and placed orders.    Cordie Deters, MD [facility] Pulmonary and Critical Care Sleep medicine

## 2024-01-11 ENCOUNTER — Other Ambulatory Visit
Admission: RE | Admit: 2024-01-11 | Discharge: 2024-01-11 | Disposition: A | Discharge status: Home / Self Care | Attending: Internal Medicine | Admitting: Internal Medicine

## 2024-01-11 DIAGNOSIS — J9611 Chronic respiratory failure with hypoxia: Secondary | ICD-10-CM | POA: Insufficient documentation

## 2024-01-11 LAB — HEPATIC FUNCTION PANEL
ALT: 23 U/L (ref 0–44)
AST: 19 U/L (ref 15–41)
Albumin: 3.9 g/dL (ref 3.5–5.0)
Alkaline Phosphatase: 52 U/L (ref 38–126)
Bilirubin, Direct: <0.1 mg/dL (ref 0.0–0.2)
Total Bilirubin: 1 mg/dL (ref 0.0–1.2)
Total Protein: 6.9 g/dL (ref 6.5–8.1)

## 2024-01-11 LAB — CBC WITH DIFFERENTIAL/PLATELET
Abs Immature Granulocytes: 0.03 10*3/uL (ref 0.00–0.07)
Basophils Absolute: 0 10*3/uL (ref 0.0–0.1)
Basophils Relative: 1 %
Eosinophils Absolute: 0.1 10*3/uL (ref 0.0–0.5)
Eosinophils Relative: 1 %
HCT: 42.5 % (ref 36.0–46.0)
Hemoglobin: 14.1 g/dL (ref 12.0–15.0)
Immature Granulocytes: 0 %
Lymphocytes Relative: 42 %
Lymphs Abs: 3.2 10*3/uL (ref 0.7–4.0)
MCH: 29.5 pg (ref 26.0–34.0)
MCHC: 33.2 g/dL (ref 30.0–36.0)
MCV: 88.9 fL (ref 80.0–100.0)
Monocytes Absolute: 0.5 10*3/uL (ref 0.1–1.0)
Monocytes Relative: 7 %
Neutro Abs: 3.7 10*3/uL (ref 1.7–7.7)
Neutrophils Relative %: 49 %
Platelets: 220 10*3/uL (ref 150–400)
RBC: 4.78 MIL/uL (ref 3.87–5.11)
RDW: 13.1 % (ref 11.5–15.5)
WBC: 7.5 10*3/uL (ref 4.0–10.5)
nRBC: 0 % (ref 0.0–0.2)

## 2024-01-11 LAB — BASIC METABOLIC PANEL WITH GFR
Anion gap: 9 (ref 5–15)
BUN: 23 mg/dL (ref 8–23)
CO2: 22 mmol/L (ref 22–32)
Calcium: 9.1 mg/dL (ref 8.9–10.3)
Chloride: 104 mmol/L (ref 98–111)
Creatinine, Ser: 0.82 mg/dL (ref 0.44–1.00)
GFR, Estimated: >60 mL/min (ref >60)
Glucose, Bld: 110 mg/dL — ABNORMAL HIGH (ref 70–99)
Potassium: 4 mmol/L (ref 3.5–5.1)
Sodium: 135 mmol/L (ref 135–145)

## 2024-01-14 ENCOUNTER — Encounter: Payer: Self-pay | Admitting: Cardiology

## 2024-01-14 ENCOUNTER — Ambulatory Visit: Payer: PRIVATE HEALTH INSURANCE | Attending: Cardiology | Admitting: Cardiology

## 2024-01-14 VITALS — BP 108/58 | HR 75 | Ht 66.0 in | Wt 190.4 lb

## 2024-01-14 DIAGNOSIS — E7800 Pure hypercholesterolemia, unspecified: Secondary | ICD-10-CM

## 2024-01-14 DIAGNOSIS — R600 Localized edema: Secondary | ICD-10-CM | POA: Diagnosis not present

## 2024-01-14 DIAGNOSIS — I2510 Atherosclerotic heart disease of native coronary artery without angina pectoris: Secondary | ICD-10-CM

## 2024-01-14 DIAGNOSIS — R252 Cramp and spasm: Secondary | ICD-10-CM

## 2024-01-14 NOTE — Progress Notes (Signed)
 Cardiology Office Note:    Date:  01/14/2024   ID:  Derita Michelsen, DOB 06-20-1946, MRN [id number]  PCP:  Jacques Mattock, PA-C   [facility] Providers Cardiologist:  Constancia Delton, MD     Referring MD: Jacques Mattock, PA*   No chief complaint on file.   History of Present Illness:    Amber Holder is a 78 y.o. female with a hx of CAD (CTO RCA mild to moderate LCx and LAD dz), hyperlipidemia, diabetes, COPD, former smoker x40+ years, PAD/carotid stenosis, IBS, hard of hearing who presents for follow-up.    Patient was previously seen due to worsening leg edema after starting Opsumit  for PAH.  Torsemide  was increased to 40 mg daily, Aldactone  25 mg daily also started.  Kidney function and potassium prostate normal.  Edema has improved significantly.  She complains of leg cramping, also cramping in her hands.  This is a chronic issue ongoing several years now.  Prior notes CMR EF 58%, subendocardial scar comprising 75% wall thickness in the LV inferior wall, inferior wall nonfriable. Coronary CTA 10/2021 calcium  score 790, CTO proximal RCA, nonobstructive LAD and left circumflex disease Echo 10/2021 EF 60 to 65%, impaired laxation. Did not tolerate higher doses of Lipitor, currently on 20 mg daily.  Outside echocardiogram 2021 showed normal systolic function, diastolic dysfunction EF 60% Chest CT 06/2016 showed coronary artery calcification  Past Medical History:  Diagnosis Date   Anxiety 01/02/2012   Arthritis    Asthma    Blockage of coronary artery of heart (HCC)    Chronic kidney disease    Kidney stones   Colon polyps    COPD (chronic obstructive pulmonary disease) (HCC)    Diabetes mellitus without complication (HCC)    GERD (gastroesophageal reflux disease)    HOH (hard of hearing)    Bilateral hearing aids   Kidney stone    Osteoporosis    Sleep apnea    Does not use C-PAP on a regular basis   Urinary tract infection    Venous stasis     Past  Surgical History:  Procedure Laterality Date   APPENDECTOMY     cataract surgery Bilateral    CHOLECYSTECTOMY     COLONOSCOPY     COLONOSCOPY WITH PROPOFOL  N/A 05/28/2015   Procedure: COLONOSCOPY WITH PROPOFOL ;  Surgeon: Deveron Fly, MD;  Location: [facility] ENDOSCOPY;  Service: Endoscopy;  Laterality: N/A;   COLONOSCOPY WITH PROPOFOL  N/A 10/28/2018   Procedure: COLONOSCOPY WITH PROPOFOL ;  Surgeon: Deveron Fly, MD;  Location: [facility] ENDOSCOPY;  Service: Endoscopy;  Laterality: N/A;   EYE SURGERY Bilateral 2015   Cataract Extraction with IOL   LITHOTRIPSY  2015   has had many stones   Mastoidotomy  1970   ROTATOR CUFF REPAIR Right 2011   TOTAL HIP ARTHROPLASTY Right 06/14/2016   Procedure: TOTAL HIP ARTHROPLASTY;  Surgeon: Arlyne Lame, MD;  Location: [facility] ORS;  Service: Orthopedics;  Laterality: Right;    Current Medications: Current Meds  Medication Sig   acetaminophen  (TYLENOL ) 500 MG chewable tablet Chew 1,000 mg by mouth every 8 (eight) hours as needed for pain.   albuterol  (VENTOLIN  HFA) 108 (90 Base) MCG/ACT inhaler Inhale 2 puffs into the lungs every 6 (six) hours as needed for wheezing or shortness of breath.   aspirin  EC 81 MG tablet Take 1 tablet (81 mg total) by mouth daily. Swallow whole.   atorvastatin  (LIPITOR) 40 MG tablet Take 1 tablet (40 mg total) by mouth daily.  cholecalciferol  (VITAMIN D ) 400 UNITS TABS tablet Take 2,000 Units by mouth daily.   CRANBERRY PO Take 25,000 mg by mouth daily.   diclofenac  Sodium (VOLTAREN ) 1 % GEL Apply small amount to area of pain daily as needed   ergocalciferol  (DRISDOL ) 1.25 MG (50000 UT) capsule Take one cap q week   estradiol  (ESTRACE ) 0.1 MG/GM vaginal cream Estrogen Cream Instruction Discard applicator Apply pea sized amount to tip of finger to urethra before bed. Wash hands well after application. Use Monday, Wednesday and Friday   FARXIGA  10 MG TABS tablet TAKE 1 TABLET BY MOUTH DAILY BEFORE BREAKFAST.   fluconazole   (DIFLUCAN ) 150 MG tablet Take one tab po q week for fungal infection and as needed   Fluticasone -Umeclidin-Vilant (TRELEGY ELLIPTA ) 100-62.5-25 MCG/ACT AEPB Use inhale  orally daily   glucose blood (ONETOUCH VERIO) test strip BLOOD SUGAR TESTING ONCE DAILY . DX E11.65   hydrocortisone  cream 0.5 % Apply topically as needed.   ibandronate  (BONIVA ) 150 MG tablet TAKE 1 TABLET (150 MG TOTAL) BY MOUTH EVERY 30 (THIRTY) DAYS.   ketoconazole (NIZORAL) 2 % cream Apply 1 Application topically daily.   Lancets (ONETOUCH DELICA PLUS LANCET30G) MISC Use  as directed twice a daily DX E11.65   macitentan  (OPSUMIT ) 10 MG tablet Take 1 tablet (10 mg total) by mouth daily.   Magnesium  250 MG TABS Take by mouth. Takes 1 tablet 2-3 times per week   montelukast  (SINGULAIR ) 10 MG tablet Take 1 tablet (10 mg total) by mouth daily as needed.   Multiple Vitamins-Minerals (MULTIVITAMIN ADULTS PO) Take 1 tablet by mouth daily.   omeprazole  (PRILOSEC) 40 MG capsule Take 1 capsule (40 mg total) by mouth daily. (Patient taking differently: Take 40 mg by mouth as needed.)   OXYGEN  Inhale 3 L into the lungs. Pt uses [facility] for Oxygen    potassium chloride  SA (KLOR-CON  M) 20 MEQ tablet Take 2 tablets (40 mEq total) by mouth daily.   spironolactone  (ALDACTONE ) 25 MG tablet Take 1 tablet (25 mg total) by mouth daily.   Torsemide  40 MG TABS Take 40 mg by mouth daily.   Current Facility-Administered Medications for the 01/14/24 encounter (Office Visit) with Constancia Delton, MD  Medication   nitrofurantoin  (macrocrystal-monohydrate) (MACROBID ) capsule 100 mg     Allergies:   Iodinated contrast media, Shellfish allergy , Sulfa antibiotics, Sulfasalazine, Percocet [oxycodone-acetaminophen ], and Iodine   Social History   Socioeconomic History   Marital status: Divorced    Spouse name: Not on file   Number of children: Not on file   Years of education: Not on file   Highest education level: Not on file   Occupational History   Not on file  Tobacco Use   Smoking status: Former    Current packs/day: 0.00    Average packs/day: 1 pack/day for 50.0 years (50.0 ttl pk-yrs)    Types: Cigarettes    Start date: 12/29/1971    Quit date: 12/28/2021    Years since quitting: 2.0    Passive exposure: Never   Smokeless tobacco: Never  Vaping Use   Vaping status: Never Used  Substance and Sexual Activity   Alcohol  use: Not Currently    Comment: very rarely    Drug use: No   Sexual activity: Not Currently    Birth control/protection: Post-menopausal  Other Topics Concern   Not on file  Social History Narrative   Not on file   Social Drivers of Health   Financial Resource Strain: Not on file  Food Insecurity: Not on file  Transportation Needs: Not on file  Physical Activity: Insufficiently Active (09/21/2022)   Exercise Vital Sign    Days of Exercise per Week: 3 days    Minutes of Exercise per Session: 30 min  Stress: Not on file  Social Connections: Not on file     Family History: The patient's family history includes Breast cancer in her maternal aunt; Breast cancer (age of onset: 6) in her maternal aunt. There is no history of Bladder Cancer or Kidney cancer.  ROS:   Please see the history of present illness.     All other systems reviewed and are negative.  EKGs/Labs/Other Studies Reviewed:    The following studies were reviewed today:        Recent Labs: 06/11/2023: TSH 1.797 01/11/2024: ALT 23; BUN 23; Creatinine, Ser 0.82; Hemoglobin 14.1; Platelets 220; Potassium 4.0; Sodium 135  Recent Lipid Panel    Component Value Date/Time   CHOL 169 06/11/2023 1032   CHOL 239 (H) 12/24/2020 1154   TRIG 79 06/11/2023 1032   HDL 57 06/11/2023 1032   HDL 55 12/24/2020 1154   CHOLHDL 3.0 06/11/2023 1032   VLDL 16 06/11/2023 1032   LDLCALC 96 06/11/2023 1032   LDLCALC 168 (H) 12/24/2020 1154   LDLDIRECT 100 (H) 06/11/2023 1032     Risk Assessment/Calculations:           Physical Exam:    VS:  BP (!) 108/58 (BP Location: Left Arm, Patient Position: Sitting, Cuff Size: Normal)   Pulse 75   Ht 5\' 6"  (1.676 m)   Wt 190 lb 6.4 oz (86.4 kg)   SpO2 93%   BMI 30.73 kg/m     Wt Readings from Last 3 Encounters:  01/14/24 190 lb 6.4 oz (86.4 kg)  01/07/24 191 lb (86.6 kg)  11/29/23 191 lb 8 oz (86.9 kg)     GEN:  Well nourished, well developed in no acute distress HEENT: Normal NECK: No JVD; No carotid bruits CARDIAC: RRR, no murmurs, rubs, gallops RESPIRATORY: Decreased breath sounds, no wheezing. ABDOMEN: Soft, non-tender, non-distended MUSCULOSKELETAL:  1+ edema. SKIN: Warm and dry NEUROLOGIC:  Alert and oriented x 3 PSYCHIATRIC:  Normal affect   ASSESSMENT:    1. Coronary artery disease involving native coronary artery of native heart, unspecified whether angina present   2. Pure hypercholesterolemia   3. Leg edema   4. Muscle cramps     PLAN:    In order of problems listed above:  CAD, CTO RCA.  Calcium  score 790.  CMR 10/23 showed inferior LV wall nonviable.  EF 58%.  Denies chest pain.  Continue aspirin  81 mg daily  Echo EF 60 to 65%, impaired relaxation.  No plans for left heart cath, as patient has CTO, patient also has iodine allergies. Hyperlipidemia, complains of cramps in hands and legs.  Stop Lipitor x 2 weeks.  Monitor for symptoms.  Consider Zetia  versus restarting Lipitor follow-up visit pending symptoms improvement. Leg edema bilaterally, worsening with taking Opsumit  for [facility] management.  Edema much improved with increased dose of torsemide .  Continue torsemide  40 mg daily, Aldactone  25 mg daily.  KCl 40 mEq daily.  Leg, hands cramping.  Hold Lipitor x 2 weeks as above.  Potassium normal.  Consider Zetia  if symptoms improve.  Follow-up in 6 weeks.  Medication Adjustments/Labs and Tests Ordered: Current medicines are reviewed at length with the patient today.  Concerns regarding medicines are outlined above.  No orders of  the defined types were placed in this encounter.  No orders of the defined types were placed in this encounter.   Patient Instructions  Medication Instructions:  - Hold Lipitor for 2 weeks (5/20-6/3)  *If you need a refill on your cardiac medications before your next appointment, please call your pharmacy*  Lab Work: No labs ordered today  If you have labs (blood work) drawn today and your tests are completely normal, you will receive your results only by: MyChart Message (if you have MyChart) OR A paper copy in the mail If you have any lab test that is abnormal or we need to change your treatment, we will call you to review the results.  Testing/Procedures: No test ordered today   Follow-Up: At [facility], you and your health needs are our priority.  As part of our continuing mission to provide you with exceptional heart care, our providers are all part of one team.  This team includes your primary Cardiologist (physician) and Advanced Practice Providers or APPs (Physician Assistants and Nurse Practitioners) who all work together to provide you with the care you need, when you need it.  Your next appointment:   6 week(s)  Provider:   You will see Constancia Delton, MD   We recommend signing up for the patient portal called "MyChart".  Sign up information is provided on this After Visit Summary.  MyChart is used to connect with patients for Virtual Visits (Telemedicine).  Patients are able to view lab/test results, encounter notes, upcoming appointments, etc.  Non-urgent messages can be sent to your provider as well.   To learn more about what you can do with MyChart, go to [email or web address].         Signed, Constancia Delton, MD  01/14/2024 4:07 PM    [facility]

## 2024-01-14 NOTE — Patient Instructions (Signed)
 Medication Instructions:  - Hold Lipitor for 2 weeks (5/20-6/3)  *If you need a refill on your cardiac medications before your next appointment, please call your pharmacy*  Lab Work: No labs ordered today  If you have labs (blood work) drawn today and your tests are completely normal, you will receive your results only by: MyChart Message (if you have MyChart) OR A paper copy in the mail If you have any lab test that is abnormal or we need to change your treatment, we will call you to review the results.  Testing/Procedures: No test ordered today   Follow-Up: At [facility], you and your health needs are our priority.  As part of our continuing mission to provide you with exceptional heart care, our providers are all part of one team.  This team includes your primary Cardiologist (physician) and Advanced Practice Providers or APPs (Physician Assistants and Nurse Practitioners) who all work together to provide you with the care you need, when you need it.  Your next appointment:   6 week(s)  Provider:   You will see Constancia Delton, MD   We recommend signing up for the patient portal called "MyChart".  Sign up information is provided on this After Visit Summary.  MyChart is used to connect with patients for Virtual Visits (Telemedicine).  Patients are able to view lab/test results, encounter notes, upcoming appointments, etc.  Non-urgent messages can be sent to your provider as well.   To learn more about what you can do with MyChart, go to [email or web address].

## 2024-01-15 ENCOUNTER — Encounter: Attending: Internal Medicine | Admitting: *Deleted

## 2024-01-15 ENCOUNTER — Encounter: Payer: Self-pay | Admitting: *Deleted

## 2024-01-15 DIAGNOSIS — Z713 Dietary counseling and surveillance: Secondary | ICD-10-CM | POA: Insufficient documentation

## 2024-01-15 DIAGNOSIS — I2721 Secondary pulmonary arterial hypertension: Secondary | ICD-10-CM | POA: Insufficient documentation

## 2024-01-15 DIAGNOSIS — Z6832 Body mass index (BMI) 32.0-32.9, adult: Secondary | ICD-10-CM | POA: Insufficient documentation

## 2024-01-15 NOTE — Progress Notes (Signed)
 Initial phone call completed. Diagnosis can be found in [facility] 5/12. EP Orientation scheduled for Thursday 5/22 at 9:30am.

## 2024-01-16 DIAGNOSIS — H6123 Impacted cerumen, bilateral: Secondary | ICD-10-CM | POA: Diagnosis not present

## 2024-01-16 DIAGNOSIS — H903 Sensorineural hearing loss, bilateral: Secondary | ICD-10-CM | POA: Diagnosis not present

## 2024-01-16 DIAGNOSIS — D164 Benign neoplasm of bones of skull and face: Secondary | ICD-10-CM | POA: Diagnosis not present

## 2024-01-17 ENCOUNTER — Other Ambulatory Visit: Payer: Self-pay | Admitting: Internal Medicine

## 2024-01-17 ENCOUNTER — Encounter

## 2024-01-17 VITALS — Ht 64.25 in | Wt 188.9 lb

## 2024-01-17 DIAGNOSIS — I2721 Secondary pulmonary arterial hypertension: Secondary | ICD-10-CM

## 2024-01-17 DIAGNOSIS — Z6832 Body mass index (BMI) 32.0-32.9, adult: Secondary | ICD-10-CM | POA: Diagnosis not present

## 2024-01-17 DIAGNOSIS — F320 Major depressive disorder, single episode, mild: Secondary | ICD-10-CM

## 2024-01-17 DIAGNOSIS — E1165 Type 2 diabetes mellitus with hyperglycemia: Secondary | ICD-10-CM

## 2024-01-17 DIAGNOSIS — J9611 Chronic respiratory failure with hypoxia: Secondary | ICD-10-CM

## 2024-01-17 DIAGNOSIS — J418 Mixed simple and mucopurulent chronic bronchitis: Secondary | ICD-10-CM

## 2024-01-17 DIAGNOSIS — Z713 Dietary counseling and surveillance: Secondary | ICD-10-CM | POA: Diagnosis not present

## 2024-01-17 DIAGNOSIS — N390 Urinary tract infection, site not specified: Secondary | ICD-10-CM

## 2024-01-17 NOTE — Progress Notes (Signed)
 Assessment start time: 10:15 AM  Digestive issues/concerns: allergic to shellfish and shrimp   24-hours Recall: B: boiled egg, slice of toast, 5 strawberries, 8 grapes L: pita bread, lettuce, tomato, cheese D: fish, mashed potato, asparagus   Beverages water (40-64oz), two cups of coffee Caffeine coffee  Supplements MVI  Education r/t nutrition plan Patient drinking mostly water, getting ~40-64oz daily. She says she usually eats 3 meals per day. Reviewed her food recall, educated on importance of pairing quality carbs with protein and healthy fat. She already monitors her sodium intake, says she doesn't cook or use it at the table. Encouraged her to read labels and try and keep sodium below 1500mg . Provided Mediterranean diet handout. Educated on types of fats, sources, and how to read labels. Brainstormed several meals and snack ideas and went over sample plate handout.    Goal 1: Eat 15-30gProtein and 30-60gCarbs at each meal. Goal 2: Read labels and reduce sodium intake to below 2300mg . Ideally 1500mg  per day.   End time 10:43 AM

## 2024-01-17 NOTE — Progress Notes (Signed)
 Pulmonary Individual Treatment Plan  Patient Details  Name: Amber Holder MRN: [id number] Date of Birth: [birth year] Referring Provider:   Flowsheet Row Pulmonary Rehab from 01/17/2024 in [facility] Cardiac and Pulmonary Rehab  Referring Provider Dr. Cam Cava, MD       Initial Encounter Date:  Flowsheet Row Pulmonary Rehab from 01/17/2024 in [facility] Cardiac and Pulmonary Rehab  Date 01/17/24       Visit Diagnosis: Pulmonary artery hypertension (HCC)  Patient's Home Medications on Admission:  Current Outpatient Medications:    acetaminophen  (TYLENOL ) 500 MG chewable tablet, Chew 1,000 mg by mouth every 8 (eight) hours as needed for pain., Disp: , Rfl:    albuterol  (VENTOLIN  HFA) 108 (90 Base) MCG/ACT inhaler, Inhale 2 puffs into the lungs every 6 (six) hours as needed for wheezing or shortness of breath., Disp: 18 g, Rfl: 2   aspirin  EC 81 MG tablet, Take 1 tablet (81 mg total) by mouth daily. Swallow whole., Disp: 90 tablet, Rfl: 3   atorvastatin  (LIPITOR) 40 MG tablet, Take 1 tablet (40 mg total) by mouth daily. (Patient not taking: Reported on 01/15/2024), Disp: 90 tablet, Rfl: 3   cholecalciferol  (VITAMIN D ) 400 UNITS TABS tablet, Take 2,000 Units by mouth daily., Disp: , Rfl:    CRANBERRY PO, Take 25,000 mg by mouth daily., Disp: , Rfl:    diclofenac  Sodium (VOLTAREN ) 1 % GEL, Apply small amount to area of pain daily as needed, Disp: 2 g, Rfl: 0   ergocalciferol  (DRISDOL ) 1.25 MG (50000 UT) capsule, Take one cap q week, Disp: 12 capsule, Rfl: 3   estradiol  (ESTRACE ) 0.1 MG/GM vaginal cream, Estrogen Cream Instruction Discard applicator Apply pea sized amount to tip of finger to urethra before bed. Wash hands well after application. Use Monday, Wednesday and Friday, Disp: 42.5 g, Rfl: 12   FARXIGA  10 MG TABS tablet, TAKE 1 TABLET BY MOUTH DAILY BEFORE BREAKFAST., Disp: 90 tablet, Rfl: 3   fluconazole  (DIFLUCAN ) 150 MG tablet, Take one tab po q week for fungal infection and as needed, Disp:  12 tablet, Rfl: 1   Fluticasone -Umeclidin-Vilant (TRELEGY ELLIPTA ) 100-62.5-25 MCG/ACT AEPB, Use inhale  orally daily, Disp: 3 each, Rfl: 4   glucose blood (ONETOUCH VERIO) test strip, BLOOD SUGAR TESTING ONCE DAILY . DX E11.65, Disp: 100 strip, Rfl: 3   hydrocortisone  cream 0.5 %, Apply topically as needed., Disp: 30 g, Rfl: 0   ibandronate  (BONIVA ) 150 MG tablet, TAKE 1 TABLET (150 MG TOTAL) BY MOUTH EVERY 30 (THIRTY) DAYS., Disp: 3 tablet, Rfl: 3   ketoconazole (NIZORAL) 2 % cream, Apply 1 Application topically daily., Disp: , Rfl:    Lancets (ONETOUCH DELICA PLUS LANCET30G) MISC, Use  as directed twice a daily DX E11.65, Disp: 100 each, Rfl: 3   macitentan  (OPSUMIT ) 10 MG tablet, Take 1 tablet (10 mg total) by mouth daily., Disp: 90 tablet, Rfl: 5   Magnesium  250 MG TABS, Take by mouth. Takes 1 tablet 2-3 times per week, Disp: , Rfl:    montelukast  (SINGULAIR ) 10 MG tablet, Take 1 tablet (10 mg total) by mouth daily as needed., Disp: 90 tablet, Rfl: 1   Multiple Vitamins-Minerals (MULTIVITAMIN ADULTS PO), Take 1 tablet by mouth daily., Disp: , Rfl:    omeprazole  (PRILOSEC) 40 MG capsule, TAKE 1 CAPSULE (40 MG TOTAL) BY MOUTH DAILY., Disp: 90 capsule, Rfl: 3   OXYGEN , Inhale 3 L into the lungs. Pt uses [facility] for Oxygen , Disp: , Rfl:    potassium chloride  SA (KLOR-CON   M) 20 MEQ tablet, Take 2 tablets (40 mEq total) by mouth daily., Disp: 180 tablet, Rfl: 3   spironolactone  (ALDACTONE ) 25 MG tablet, Take 1 tablet (25 mg total) by mouth daily., Disp: 90 tablet, Rfl: 3   Torsemide  40 MG TABS, Take 40 mg by mouth daily., Disp: 90 tablet, Rfl: 3  Current Facility-Administered Medications:    nitrofurantoin  (macrocrystal-monohydrate) (MACROBID ) capsule 100 mg, 100 mg, Oral, Q12H, McDonough, Lillie Reining, PA-C  Past Medical History: Past Medical History:  Diagnosis Date   Anxiety 01/02/2012   Arthritis    Asthma    Blockage of coronary artery of heart (HCC)    Chronic kidney disease     Kidney stones   Colon polyps    COPD (chronic obstructive pulmonary disease) (HCC)    Diabetes mellitus without complication (HCC)    GERD (gastroesophageal reflux disease)    HOH (hard of hearing)    Bilateral hearing aids   Kidney stone    Osteoporosis    Sleep apnea    Does not use C-PAP on a regular basis   Urinary tract infection    Venous stasis     Tobacco Use: Social History   Tobacco Use  Smoking Status Former   Current packs/day: 0.00   Average packs/day: 1 pack/day for 50.0 years (50.0 ttl pk-yrs)   Types: Cigarettes   Start date: 12/29/1971   Quit date: 12/28/2021   Years since quitting: 2.0   Passive exposure: Never  Smokeless Tobacco Never    Labs: Review Flowsheet  More data exists      Latest Ref Rng & Units 05/19/2022 07/07/2022 09/05/2022 06/11/2023 11/01/2023  Labs for ITP Cardiac and Pulmonary Rehab  Cholestrol 0 - 200 mg/dL 478  295  - 621  -  LDL (calc) 0 - 99 mg/dL 92  308  - 96  -  Direct LDL 0 - 99 mg/dL - - - 657  -  HDL-C >84 mg/dL 48  45  - 57  -  Trlycerides <150 mg/dL 696  295  - 79  -  Hemoglobin A1c 4.0 - 5.6 % - - 6.3  6.6  6.4      Pulmonary Assessment Scores:  Pulmonary Assessment Scores     Row Name 01/17/24 1149         ADL UCSD   ADL Phase Entry     SOB Score total 64     Rest 1     Walk 3     Stairs 4     Bath 3     Dress 2     Shop 3       CAT Score   CAT Score 14       mMRC Score   mMRC Score 2              UCSD: Self-administered rating of dyspnea associated with activities of daily living (ADLs) 6-point scale (0 = "not at all" to 5 = "maximal or unable to do because of breathlessness")  Scoring Scores range from 0 to 120.  Minimally important difference is 5 units  CAT: CAT can identify the health impairment of COPD patients and is better correlated with disease progression.  CAT has a scoring range of zero to 40. The CAT score is classified into four groups of low (less than 10), medium (10 - 20), high  (21-30) and very high (31-40) based on the impact level of disease on health status. A CAT score over 10  suggests significant symptoms.  A worsening CAT score could be explained by an exacerbation, poor medication adherence, poor inhaler technique, or progression of COPD or comorbid conditions.  CAT MCID is 2 points  mMRC: mMRC (Modified Medical Research Council) Dyspnea Scale is used to assess the degree of baseline functional disability in patients of respiratory disease due to dyspnea. No minimal important difference is established. A decrease in score of 1 point or greater is considered a positive change.   Pulmonary Function Assessment:   Exercise Target Goals: Exercise Program Goal: Individual exercise prescription set using results from initial 6 min walk test and THRR while considering  patient's activity barriers and safety.   Exercise Prescription Goal: Initial exercise prescription builds to 30-45 minutes a day of aerobic activity, 2-3 days per week.  Home exercise guidelines will be given to patient during program as part of exercise prescription that the participant will acknowledge.  Education: Aerobic Exercise: - Group verbal and visual presentation on the components of exercise prescription. Introduces F.I.T.T principle from ACSM for exercise prescriptions.  Reviews F.I.T.T. principles of aerobic exercise including progression. Written material given at graduation. Flowsheet Row Pulmonary Rehab from 08/23/2022 in [facility] Cardiac and Pulmonary Rehab  Date 07/05/22  Educator [facility]  Instruction Review Code 1- Verbalizes Understanding       Education: Resistance Exercise: - Group verbal and visual presentation on the components of exercise prescription. Introduces F.I.T.T principle from ACSM for exercise prescriptions  Reviews F.I.T.T. principles of resistance exercise including progression. Written material given at graduation.    Education: Exercise & Equipment Safety: -  Individual verbal instruction and demonstration of equipment use and safety with use of the equipment. Flowsheet Row Pulmonary Rehab from 01/17/2024 in [facility] Cardiac and Pulmonary Rehab  Date 01/17/24  Educator NT  Instruction Review Code 1- Verbalizes Understanding       Education: Exercise Physiology & General Exercise Guidelines: - Group verbal and written instruction with models to review the exercise physiology of the cardiovascular system and associated critical values. Provides general exercise guidelines with specific guidelines to those with heart or lung disease.  Flowsheet Row Pulmonary Rehab from 08/23/2022 in [facility] Cardiac and Pulmonary Rehab  Date 06/28/22  Educator [facility]  Instruction Review Code 1- Verbalizes Understanding       Education: Flexibility, Balance, Mind/Body Relaxation: - Group verbal and visual presentation with interactive activity on the components of exercise prescription. Introduces F.I.T.T principle from ACSM for exercise prescriptions. Reviews F.I.T.T. principles of flexibility and balance exercise training including progression. Also discusses the mind body connection.  Reviews various relaxation techniques to help reduce and manage stress (i.e. Deep breathing, progressive muscle relaxation, and visualization). Balance handout provided to take home. Written material given at graduation.   Activity Barriers & Risk Stratification:  Activity Barriers & Cardiac Risk Stratification - 01/17/24 1150       Activity Barriers & Cardiac Risk Stratification   Activity Barriers Right Hip Replacement;Shortness of Breath;Muscular Weakness;Balance Concerns;Arthritis;Back Problems             6 Minute Walk:  6 Minute Walk     Row Name 01/17/24 1151         6 Minute Walk   Phase Initial     Distance 1050 feet     Walk Time 6 minutes     # of Rest Breaks 0     MPH 1.99     METS 1.58     RPE 13     Perceived Dyspnea  2     VO2 Peak 5.51     Symptoms Yes  (comment)     Comments R leg weakness     Resting HR 62 bpm     Resting BP 132/64     Resting Oxygen  Saturation  93 %     Exercise Oxygen  Saturation  during 6 min walk 87 %     Max Ex. HR 84 bpm     Max Ex. BP 132/64     2 Minute Post BP 114/58       Interval HR   1 Minute HR 76     2 Minute HR 80     3 Minute HR 81     4 Minute HR 81     5 Minute HR 84     6 Minute HR 83     2 Minute Post HR 63     Interval Heart Rate? Yes       Interval Oxygen    Interval Oxygen ? Yes     Baseline Oxygen  Saturation % 93 %     1 Minute Oxygen  Saturation % 91 %     1 Minute Liters of Oxygen  2 L     2 Minute Oxygen  Saturation % 88 %     2 Minute Liters of Oxygen  2 L     3 Minute Oxygen  Saturation % 87 %     3 Minute Liters of Oxygen  2 L     4 Minute Oxygen  Saturation % 87 %     4 Minute Liters of Oxygen  2 L     5 Minute Oxygen  Saturation % 87 %     5 Minute Liters of Oxygen  2 L     6 Minute Oxygen  Saturation % 87 %     6 Minute Liters of Oxygen  2 L     2 Minute Post Oxygen  Saturation % 94 %     2 Minute Post Liters of Oxygen  2 L             Oxygen  Initial Assessment:  Oxygen  Initial Assessment - 01/15/24 1302       Home Oxygen    Home Oxygen  Device Home Concentrator;E-Tanks;Portable Concentrator    Sleep Oxygen  Prescription Continuous    Liters per minute 2    Home Exercise Oxygen  Prescription Continuous    Liters per minute 3   2-3   Home Resting Oxygen  Prescription Continuous    Liters per minute 2.5    Compliance with Home Oxygen  Use Yes             Oxygen  Re-Evaluation:   Oxygen  Discharge (Final Oxygen  Re-Evaluation):   Initial Exercise Prescription:  Initial Exercise Prescription - 01/17/24 1100       Date of Initial Exercise RX and Referring Provider   Date 01/17/24    Referring Provider Dr. Cam Cava, MD      Oxygen    Oxygen  Continuous    Liters 2-3    Maintain Oxygen  Saturation 88% or higher      Treadmill   MPH 1.8    Grade 0    Minutes 15     METs 2.38      Recumbant Bike   Level 1    RPM 50    Watts 15    Minutes 15    METs 1.58      NuStep   Level 2    SPM 80    Minutes 15    METs 1.58  Prescription Details   Frequency (times per week) 2    Duration Progress to 30 minutes of continuous aerobic without signs/symptoms of physical distress      Intensity   THRR 40-80% of Max Heartrate 94-126    Ratings of Perceived Exertion 11-13    Perceived Dyspnea 0-4      Progression   Progression Continue to progress workloads to maintain intensity without signs/symptoms of physical distress.      Resistance Training   Training Prescription Yes    Weight 3 lb    Reps 10-15             Perform Capillary Blood Glucose checks as needed.  Exercise Prescription Changes:   Exercise Prescription Changes     Row Name 01/17/24 1100             Response to Exercise   Blood Pressure (Admit) 120/60       Blood Pressure (Exercise) 132/64       Blood Pressure (Exit) 114/58       Heart Rate (Admit) 62 bpm       Heart Rate (Exercise) 84 bpm       Heart Rate (Exit) 63 bpm       Oxygen  Saturation (Admit) 93 %       Oxygen  Saturation (Exercise) 87 %       Oxygen  Saturation (Exit) 94 %       Rating of Perceived Exertion (Exercise) 13       Perceived Dyspnea (Exercise) 2       Symptoms R leg weakness       Comments Results                Exercise Comments:   Exercise Goals and Review:   Exercise Goals     Row Name 01/17/24 1150             Exercise Goals   Increase Physical Activity Yes       Intervention Provide advice, education, support and counseling about physical activity/exercise needs.;Develop an individualized exercise prescription for aerobic and resistive training based on initial evaluation findings, risk stratification, comorbidities and participant's personal goals.       Expected Outcomes Short Term: Attend rehab on a regular basis to increase amount of physical  activity.;Long Term: Add in home exercise to make exercise part of routine and to increase amount of physical activity.;Long Term: Exercising regularly at least 3-5 days a week.       Increase Strength and Stamina Yes       Intervention Provide advice, education, support and counseling about physical activity/exercise needs.;Develop an individualized exercise prescription for aerobic and resistive training based on initial evaluation findings, risk stratification, comorbidities and participant's personal goals.       Expected Outcomes Short Term: Increase workloads from initial exercise prescription for resistance, speed, and METs.;Short Term: Perform resistance training exercises routinely during rehab and add in resistance training at home;Long Term: Improve cardiorespiratory fitness, muscular endurance and strength as measured by increased METs and functional capacity ( )       Able to understand and use rate of perceived exertion (RPE) scale Yes       Intervention Provide education and explanation on how to use RPE scale       Expected Outcomes Short Term: Able to use RPE daily in rehab to express subjective intensity level;Long Term:  Able to use RPE to guide intensity level when exercising independently  Able to understand and use Dyspnea scale Yes       Intervention Provide education and explanation on how to use Dyspnea scale       Expected Outcomes Short Term: Able to use Dyspnea scale daily in rehab to express subjective sense of shortness of breath during exertion;Long Term: Able to use Dyspnea scale to guide intensity level when exercising independently       Knowledge and understanding of Target Heart Rate Range (THRR) Yes       Intervention Provide education and explanation of THRR including how the numbers were predicted and where they are located for reference       Expected Outcomes Short Term: Able to state/look up THRR;Long Term: Able to use THRR to govern intensity when  exercising independently;Short Term: Able to use daily as guideline for intensity in rehab       Able to check pulse independently Yes       Intervention Provide education and demonstration on how to check pulse in carotid and radial arteries.;Review the importance of being able to check your own pulse for safety during independent exercise       Expected Outcomes Short Term: Able to explain why pulse checking is important during independent exercise;Long Term: Able to check pulse independently and accurately       Understanding of Exercise Prescription Yes       Intervention Provide education, explanation, and written materials on patient's individual exercise prescription       Expected Outcomes Short Term: Able to explain program exercise prescription;Long Term: Able to explain home exercise prescription to exercise independently                Exercise Goals Re-Evaluation :   Discharge Exercise Prescription (Final Exercise Prescription Changes):  Exercise Prescription Changes - 01/17/24 1100       Response to Exercise   Blood Pressure (Admit) 120/60    Blood Pressure (Exercise) 132/64    Blood Pressure (Exit) 114/58    Heart Rate (Admit) 62 bpm    Heart Rate (Exercise) 84 bpm    Heart Rate (Exit) 63 bpm    Oxygen  Saturation (Admit) 93 %    Oxygen  Saturation (Exercise) 87 %    Oxygen  Saturation (Exit) 94 %    Rating of Perceived Exertion (Exercise) 13    Perceived Dyspnea (Exercise) 2    Symptoms R leg weakness    Comments Results             Nutrition:  Target Goals: Understanding of nutrition guidelines, daily intake of sodium 1500mg , cholesterol 200mg , calories 30% from fat and 7% or less from saturated fats, daily to have 5 or more servings of fruits and vegetables.  Education: All About Nutrition: -Group instruction provided by verbal, written material, interactive activities, discussions, models, and posters to present general guidelines for heart healthy  nutrition including fat, fiber, MyPlate, the role of sodium in heart healthy nutrition, utilization of the nutrition label, and utilization of this knowledge for meal planning. Follow up email sent as well. Written material given at graduation. Flowsheet Row Pulmonary Rehab from 01/17/2024 in [facility] Cardiac and Pulmonary Rehab  Education need identified 01/17/24       Biometrics:  Pre Biometrics - 01/17/24 1151       Pre Biometrics   Height 5' 4.25" (1.632 m)    Weight 188 lb 14.4 oz (85.7 kg)    Waist Circumference 40 inches    Hip Circumference 45 inches  Waist to Hip Ratio 0.89 %    BMI (Calculated) 32.17    Single Leg Stand 1.6 seconds              Nutrition Therapy Plan and Nutrition Goals:  Nutrition Therapy & Goals - 01/17/24 1124       Nutrition Therapy   Diet carb controlled, Cardiac Low Na    Protein (specify units) 90    Fiber 25 grams    Whole Grain Foods 3 servings    Saturated Fats 15 max. grams    Fruits and Vegetables 5 servings/day    Sodium 2 grams      Personal Nutrition Goals   Nutrition Goal Eat 15-30gProtein and 30-60gCarbs at each meal.    Personal Goal #2 Read labels and reduce sodium intake to below 2300mg . Ideally 1500mg  per day.    Comments Patient drinking mostly water, getting ~40-64oz daily. She says she usually eats 3 meals per day. Reviewed her food recall, educated on importance of pairing quality carbs with protein and healthy fat. She already monitors her sodium intake, says she doesn't cook or use it at the table. Encouraged her to read labels and try and keep sodium below 1500mg . Provided Mediterranean diet handout. Educated on types of fats, sources, and how to read labels. Brainstormed several meals and snack ideas and went over sample plate handout.      Intervention Plan   Intervention Prescribe, educate and counsel regarding individualized specific dietary modifications aiming towards targeted core components such as weight,  hypertension, lipid management, diabetes, heart failure and other comorbidities.;Nutrition handout(s) given to patient.    Expected Outcomes Short Term Goal: Understand basic principles of dietary content, such as calories, fat, sodium, cholesterol and nutrients.;Short Term Goal: A plan has been developed with personal nutrition goals set during dietitian appointment.;Long Term Goal: Adherence to prescribed nutrition plan.             Nutrition Assessments:  MEDIFICTS Score Key: >=70 Need to make dietary changes  40-70 Heart Healthy Diet <= 40 Therapeutic Level Cholesterol Diet  Flowsheet Row Pulmonary Rehab from 01/17/2024 in [facility] Cardiac and Pulmonary Rehab  Picture Your Plate Total Score on Admission 58      Picture Your Plate Scores: <16 Unhealthy dietary pattern with much room for improvement. 41-50 Dietary pattern unlikely to meet recommendations for good health and room for improvement. 51-60 More healthful dietary pattern, with some room for improvement.  >60 Healthy dietary pattern, although there may be some specific behaviors that could be improved.   Nutrition Goals Re-Evaluation:   Nutrition Goals Discharge (Final Nutrition Goals Re-Evaluation):   Psychosocial: Target Goals: Acknowledge presence or absence of significant depression and/or stress, maximize coping skills, provide positive support system. Participant is able to verbalize types and ability to use techniques and skills needed for reducing stress and depression.   Education: Stress, Anxiety, and Depression - Group verbal and visual presentation to define topics covered.  Reviews how body is impacted by stress, anxiety, and depression.  Also discusses healthy ways to reduce stress and to treat/manage anxiety and depression.  Written material given at graduation. Flowsheet Row Pulmonary Rehab from 08/23/2022 in [facility] Cardiac and Pulmonary Rehab  Date 06/21/22  Educator [facility]  Instruction Review Code 1-  [employer] Understanding       Education: Sleep Hygiene -Provides group verbal and written instruction about how sleep can affect your health.  Define sleep hygiene, discuss sleep cycles and impact of sleep habits. Review good sleep  hygiene tips.    Initial Review & Psychosocial Screening:  Initial Psych Review & Screening - 01/15/24 1309       Initial Review   Current issues with Current Depression      Family Dynamics   Good Support System? Yes   friends     Barriers   Psychosocial barriers to participate in program The patient should benefit from training in stress management and relaxation.;There are no identifiable barriers or psychosocial needs.      Screening Interventions   Interventions Encouraged to exercise;To provide support and resources with identified psychosocial needs    Expected Outcomes Short Term goal: Utilizing psychosocial counselor, staff and physician to assist with identification of specific Stressors or current issues interfering with healing process. Setting desired goal for each stressor or current issue identified.;Long Term Goal: Stressors or current issues are controlled or eliminated.;Short Term goal: Identification and review with participant of any Quality of Life or Depression concerns found by scoring the questionnaire.;Long Term goal: The participant improves quality of Life and PHQ9 Scores as seen by post scores and/or verbalization of changes             Quality of Life Scores:  Scores of 19 and below usually indicate a poorer quality of life in these areas.  A difference of  2-3 points is a clinically meaningful difference.  A difference of 2-3 points in the total score of the Quality of Life Index has been associated with significant improvement in overall quality of life, self-image, physical symptoms, and general health in studies assessing change in quality of life.  PHQ-9: Review Flowsheet  More data exists      01/17/2024 11/01/2023  06/07/2023 02/12/2023 12/12/2022  Depression screen PHQ 2/9  Decreased Interest 0 0 0 0 0  Down, Depressed, Hopeless 1 0 0 0 0  PHQ - 2 Score 1 0 0 0 0  Altered sleeping 0 - - - -  Tired, decreased energy 2 - - - -  Change in appetite 0 - - - -  Feeling bad or failure about yourself  0 - - - -  Trouble concentrating 0 - - - -  Moving slowly or fidgety/restless 0 - - - -  Suicidal thoughts 0 - - - -  PHQ-9 Score 3 - - - -  Difficult doing work/chores Not difficult at all - - - -   Interpretation of Total Score  Total Score Depression Severity:  1-4 = Minimal depression, 5-9 = Mild depression, 10-14 = Moderate depression, 15-19 = Moderately severe depression, 20-27 = Severe depression   Psychosocial Evaluation and Intervention:  Psychosocial Evaluation - 01/15/24 1324       Psychosocial Evaluation & Interventions   Comments Orion is returning to pulmonary rehab with pulmonary hypertension. She states she has been working on regulating her medications and prioritizing fluid balance. She wants to attend to work on her stamina and her breathing. She has a history of depression and mentions that its a daily choice on how she is going to feel. She does have friends that help her as well as a monthly call from a support system through her insurance.    Expected Outcomes Short: attend pulmonary rehab for education and exercise Long: develop and maintain positive self care habits.    Continue Psychosocial Services  Follow up required by staff             Psychosocial Re-Evaluation:   Psychosocial Discharge (Final Psychosocial Re-Evaluation):  Education: Education Goals: Education classes will be provided on a weekly basis, covering required topics. Participant will state understanding/return demonstration of topics presented.  Learning Barriers/Preferences:   General Pulmonary Education Topics:  Infection Prevention: - Provides verbal and written material to individual with  discussion of infection control including proper hand washing and proper equipment cleaning during exercise session. Flowsheet Row Pulmonary Rehab from 01/17/2024 in [facility] Cardiac and Pulmonary Rehab  Date 01/17/24  Educator NT  Instruction Review Code 1- Verbalizes Understanding       Falls Prevention: - Provides verbal and written material to individual with discussion of falls prevention and safety. Flowsheet Row Pulmonary Rehab from 01/17/2024 in [facility] Cardiac and Pulmonary Rehab  Date 01/17/24  Educator NT  Instruction Review Code 1- Verbalizes Understanding       Chronic Lung Disease Review: - Group verbal instruction with posters, models, PowerPoint presentations and videos,  to review new updates, new respiratory medications, new advancements in procedures and treatments. Providing information on websites and "800" numbers for continued self-education. Includes information about supplement oxygen , available portable oxygen  systems, continuous and intermittent flow rates, oxygen  safety, concentrators, and Medicare reimbursement for oxygen . Explanation of Pulmonary Drugs, including class, frequency, complications, importance of spacers, rinsing mouth after steroid MDI's, and proper cleaning methods for nebulizers. Review of basic lung anatomy and physiology related to function, structure, and complications of lung disease. Review of risk factors. Discussion about methods for diagnosing sleep apnea and types of masks and machines for OSA. Includes a review of the use of types of environmental controls: home humidity, furnaces, filters, dust mite/pet prevention, HEPA vacuums. Discussion about weather changes, air quality and the benefits of nasal washing. Instruction on Warning signs, infection symptoms, calling MD promptly, preventive modes, and value of vaccinations. Review of effective airway clearance, coughing and/or vibration techniques. Emphasizing that all should Create an Action Plan.  Written material given at graduation. Flowsheet Row Pulmonary Rehab from 01/17/2024 in [facility] Cardiac and Pulmonary Rehab  Education need identified 01/17/24       AED/CPR: - Group verbal and written instruction with the use of models to demonstrate the basic use of the AED with the basic ABC's of resuscitation.    Anatomy and Cardiac Procedures: - Group verbal and visual presentation and models provide information about basic cardiac anatomy and function. Reviews the testing methods done to diagnose heart disease and the outcomes of the test results. Describes the treatment choices: Medical Management, Angioplasty, or Coronary Bypass Surgery for treating various heart conditions including Myocardial Infarction, Angina, Valve Disease, and Cardiac Arrhythmias.  Written material given at graduation.   Medication Safety: - Group verbal and visual instruction to review commonly prescribed medications for heart and lung disease. Reviews the medication, class of the drug, and side effects. Includes the steps to properly store meds and maintain the prescription regimen.  Written material given at graduation. Flowsheet Row Pulmonary Rehab from 08/23/2022 in [facility] Cardiac and Pulmonary Rehab  Date 05/31/22  Educator SB  Instruction Review Code 1- Verbalizes Understanding       Other: -Provides group and verbal instruction on various topics (see comments)   Knowledge Questionnaire Score:  Knowledge Questionnaire Score - 01/17/24 1147       Knowledge Questionnaire Score   Pre Score 13/18              Core Components/Risk Factors/Patient Goals at Admission:  Personal Goals and Risk Factors at Admission - 01/15/24 1307       Core Components/Risk  Factors/Patient Goals on Admission    Weight Management Yes;Weight Loss   fluid balance   Intervention Weight Management: Develop a combined nutrition and exercise program designed to reach desired caloric intake, while maintaining appropriate  intake of nutrient and fiber, sodium and fats, and appropriate energy expenditure required for the weight goal.;Weight Management: Provide education and appropriate resources to help participant work on and attain dietary goals.;Weight Management/Obesity: Establish reasonable short term and long term weight goals.;Obesity: Provide education and appropriate resources to help participant work on and attain dietary goals.    Expected Outcomes Short Term: Continue to assess and modify interventions until short term weight is achieved;Long Term: Adherence to nutrition and physical activity/exercise program aimed toward attainment of established weight goal;Weight Loss: Understanding of general recommendations for a balanced deficit meal plan, which promotes 1-2 lb weight loss per week and includes a negative energy balance of [phone] kcal/d;Understanding recommendations for meals to include 15-35% energy as protein, 25-35% energy from fat, 35-60% energy from carbohydrates, less than 200mg  of dietary cholesterol, 20-35 gm of total fiber daily;Understanding of distribution of calorie intake throughout the day with the consumption of 4-5 meals/snacks    Improve shortness of breath with ADL's Yes    Intervention Provide education, individualized exercise plan and daily activity instruction to help decrease symptoms of SOB with activities of daily living.    Expected Outcomes Short Term: Improve cardiorespiratory fitness to achieve a reduction of symptoms when performing ADLs;Long Term: Be able to perform more ADLs without symptoms or delay the onset of symptoms    Diabetes Yes    Intervention Provide education about signs/symptoms and action to take for hypo/hyperglycemia.;Provide education about proper nutrition, including hydration, and aerobic/resistive exercise prescription along with prescribed medications to achieve blood glucose in normal ranges: Fasting glucose 65-99 mg/dL    Expected Outcomes Short Term:  Participant verbalizes understanding of the signs/symptoms and immediate care of hyper/hypoglycemia, proper foot care and importance of medication, aerobic/resistive exercise and nutrition plan for blood glucose control.;Long Term: Attainment of HbA1C < 7%.    Heart Failure Yes    Intervention Provide a combined exercise and nutrition program that is supplemented with education, support and counseling about heart failure. Directed toward relieving symptoms such as shortness of breath, decreased exercise tolerance, and extremity edema.    Expected Outcomes Improve functional capacity of life;Short term: Attendance in program 2-3 days a week with increased exercise capacity. Reported lower sodium intake. Reported increased fruit and vegetable intake. Reports medication compliance.;Short term: Daily weights obtained and reported for increase. Utilizing diuretic protocols set by physician.;Long term: Adoption of self-care skills and reduction of barriers for early signs and symptoms recognition and intervention leading to self-care maintenance.             Education:Diabetes - Individual verbal and written instruction to review signs/symptoms of diabetes, desired ranges of glucose level fasting, after meals and with exercise. Acknowledge that pre and post exercise glucose checks will be done for 3 sessions at entry of program. Flowsheet Row Pulmonary Rehab from 08/23/2022 in [facility] Cardiac and Pulmonary Rehab  Date 05/15/22  Educator [facility]  Instruction Review Code 1- Verbalizes Understanding       Know Your Numbers and Heart Failure: - Group verbal and visual instruction to discuss disease risk factors for cardiac and pulmonary disease and treatment options.  Reviews associated critical values for Overweight/Obesity, Hypertension, Cholesterol, and Diabetes.  Discusses basics of heart failure: signs/symptoms and treatments.  Introduces Heart Failure Zone chart for  action plan for heart failure.  Written  material given at graduation. Flowsheet Row Pulmonary Rehab from 08/23/2022 in [facility] Cardiac and Pulmonary Rehab  Date 06/07/22  Educator SB  Instruction Review Code 1- Verbalizes Understanding       Core Components/Risk Factors/Patient Goals Review:    Core Components/Risk Factors/Patient Goals at Discharge (Final Review):    ITP Comments:  ITP Comments     Row Name 01/15/24 1320 01/17/24 1145         ITP Comments Initial phone call completed. Diagnosis can be found in [facility] 5/12. EP Orientation scheduled for Thursday 5/22 at 9:30am. Completed and gym orientation for respiratory care services. Initial ITP created and sent for review to Dr. Faud Aleskerov, Medical Director.               Comments: Initial ITP

## 2024-01-17 NOTE — Patient Instructions (Addendum)
 Patient Instructions  Patient Details  Name: Amber Holder MRN: [id number] Date of Birth: Jan 22, 1946 Referring Provider:  Cordie Deters, MD  Below are your personal goals for exercise, nutrition, and risk factors. Our goal is to help you stay on track towards obtaining and maintaining these goals. We will be discussing your progress on these goals with you throughout the program.  Initial Exercise Prescription:  Initial Exercise Prescription - 01/17/24 1100       Date of Initial Exercise RX and Referring Provider   Date 01/17/24    Referring Provider Dr. Cam Cava, MD      Oxygen    Oxygen  Continuous    Liters 2-3    Maintain Oxygen  Saturation 88% or higher      Treadmill   MPH 1.8    Grade 0    Minutes 15    METs 2.38      Recumbant Bike   Level 1    RPM 50    Watts 15    Minutes 15    METs 1.58      NuStep   Level 2    SPM 80    Minutes 15    METs 1.58      Prescription Details   Frequency (times per week) 2    Duration Progress to 30 minutes of continuous aerobic without signs/symptoms of physical distress      Intensity   THRR 40-80% of Max Heartrate 94-126    Ratings of Perceived Exertion 11-13    Perceived Dyspnea 0-4      Progression   Progression Continue to progress workloads to maintain intensity without signs/symptoms of physical distress.      Resistance Training   Training Prescription Yes    Weight 3 lb    Reps 10-15             Exercise Goals: Frequency: Be able to perform aerobic exercise two to three times per week in program working toward 2-5 days per week of home exercise.  Intensity: Work with a perceived exertion of 11 (fairly light) - 15 (hard) while following your exercise prescription.  We will make changes to your prescription with you as you progress through the program.   Duration: Be able to do 30 to 45 minutes of continuous aerobic exercise in addition to a 5 minute warm-up and a 5 minute cool-down routine.    Nutrition Goals: Your personal nutrition goals will be established when you do your nutrition analysis with the dietician.  The following are general nutrition guidelines to follow: Cholesterol < 200mg /day Sodium < 1500mg /day Fiber: Women over 50 yrs - 21 grams per day  Personal Goals:  Personal Goals and Risk Factors at Admission - 01/15/24 1307       Core Components/Risk Factors/Patient Goals on Admission    Weight Management Yes;Weight Loss   fluid balance   Intervention Weight Management: Develop a combined nutrition and exercise program designed to reach desired caloric intake, while maintaining appropriate intake of nutrient and fiber, sodium and fats, and appropriate energy expenditure required for the weight goal.;Weight Management: Provide education and appropriate resources to help participant work on and attain dietary goals.;Weight Management/Obesity: Establish reasonable short term and long term weight goals.;Obesity: Provide education and appropriate resources to help participant work on and attain dietary goals.    Expected Outcomes Short Term: Continue to assess and modify interventions until short term weight is achieved;Long Term: Adherence to nutrition and physical activity/exercise program aimed toward attainment of  established weight goal;Weight Loss: Understanding of general recommendations for a balanced deficit meal plan, which promotes 1-2 lb weight loss per week and includes a negative energy balance of [phone] kcal/d;Understanding recommendations for meals to include 15-35% energy as protein, 25-35% energy from fat, 35-60% energy from carbohydrates, less than 200mg  of dietary cholesterol, 20-35 gm of total fiber daily;Understanding of distribution of calorie intake throughout the day with the consumption of 4-5 meals/snacks    Improve shortness of breath with ADL's Yes    Intervention Provide education, individualized exercise plan and daily activity instruction to help  decrease symptoms of SOB with activities of daily living.    Expected Outcomes Short Term: Improve cardiorespiratory fitness to achieve a reduction of symptoms when performing ADLs;Long Term: Be able to perform more ADLs without symptoms or delay the onset of symptoms    Diabetes Yes    Intervention Provide education about signs/symptoms and action to take for hypo/hyperglycemia.;Provide education about proper nutrition, including hydration, and aerobic/resistive exercise prescription along with prescribed medications to achieve blood glucose in normal ranges: Fasting glucose 65-99 mg/dL    Expected Outcomes Short Term: Participant verbalizes understanding of the signs/symptoms and immediate care of hyper/hypoglycemia, proper foot care and importance of medication, aerobic/resistive exercise and nutrition plan for blood glucose control.;Long Term: Attainment of HbA1C < 7%.    Heart Failure Yes    Intervention Provide a combined exercise and nutrition program that is supplemented with education, support and counseling about heart failure. Directed toward relieving symptoms such as shortness of breath, decreased exercise tolerance, and extremity edema.    Expected Outcomes Improve functional capacity of life;Short term: Attendance in program 2-3 days a week with increased exercise capacity. Reported lower sodium intake. Reported increased fruit and vegetable intake. Reports medication compliance.;Short term: Daily weights obtained and reported for increase. Utilizing diuretic protocols set by physician.;Long term: Adoption of self-care skills and reduction of barriers for early signs and symptoms recognition and intervention leading to self-care maintenance.            Exercise Goals and Review:  Exercise Goals     Row Name 01/17/24 1150             Exercise Goals   Increase Physical Activity Yes       Intervention Provide advice, education, support and counseling about physical  activity/exercise needs.;Develop an individualized exercise prescription for aerobic and resistive training based on initial evaluation findings, risk stratification, comorbidities and participant's personal goals.       Expected Outcomes Short Term: Attend rehab on a regular basis to increase amount of physical activity.;Long Term: Add in home exercise to make exercise part of routine and to increase amount of physical activity.;Long Term: Exercising regularly at least 3-5 days a week.       Increase Strength and Stamina Yes       Intervention Provide advice, education, support and counseling about physical activity/exercise needs.;Develop an individualized exercise prescription for aerobic and resistive training based on initial evaluation findings, risk stratification, comorbidities and participant's personal goals.       Expected Outcomes Short Term: Increase workloads from initial exercise prescription for resistance, speed, and METs.;Short Term: Perform resistance training exercises routinely during rehab and add in resistance training at home;Long Term: Improve cardiorespiratory fitness, muscular endurance and strength as measured by increased METs and functional capacity ( )       Able to understand and use rate of perceived exertion (RPE) scale Yes  Intervention Provide education and explanation on how to use RPE scale       Expected Outcomes Short Term: Able to use RPE daily in rehab to express subjective intensity level;Long Term:  Able to use RPE to guide intensity level when exercising independently       Able to understand and use Dyspnea scale Yes       Intervention Provide education and explanation on how to use Dyspnea scale       Expected Outcomes Short Term: Able to use Dyspnea scale daily in rehab to express subjective sense of shortness of breath during exertion;Long Term: Able to use Dyspnea scale to guide intensity level when exercising independently       Knowledge and  understanding of Target Heart Rate Range (THRR) Yes       Intervention Provide education and explanation of THRR including how the numbers were predicted and where they are located for reference       Expected Outcomes Short Term: Able to state/look up THRR;Long Term: Able to use THRR to govern intensity when exercising independently;Short Term: Able to use daily as guideline for intensity in rehab       Able to check pulse independently Yes       Intervention Provide education and demonstration on how to check pulse in carotid and radial arteries.;Review the importance of being able to check your own pulse for safety during independent exercise       Expected Outcomes Short Term: Able to explain why pulse checking is important during independent exercise;Long Term: Able to check pulse independently and accurately       Understanding of Exercise Prescription Yes       Intervention Provide education, explanation, and written materials on patient's individual exercise prescription       Expected Outcomes Short Term: Able to explain program exercise prescription;Long Term: Able to explain home exercise prescription to exercise independently

## 2024-01-22 ENCOUNTER — Telehealth: Payer: Self-pay

## 2024-01-22 NOTE — Telephone Encounter (Signed)
 Faxed patient's [facility]  prescription refill to [facility].

## 2024-01-23 ENCOUNTER — Encounter

## 2024-01-23 DIAGNOSIS — Z713 Dietary counseling and surveillance: Secondary | ICD-10-CM | POA: Diagnosis not present

## 2024-01-23 DIAGNOSIS — I2721 Secondary pulmonary arterial hypertension: Secondary | ICD-10-CM | POA: Diagnosis not present

## 2024-01-23 DIAGNOSIS — Z6832 Body mass index (BMI) 32.0-32.9, adult: Secondary | ICD-10-CM | POA: Diagnosis not present

## 2024-01-23 LAB — GLUCOSE, CAPILLARY
Glucose-Capillary: 118 mg/dL — ABNORMAL HIGH (ref 70–99)
Glucose-Capillary: 106 mg/dL — ABNORMAL HIGH (ref 70–99)

## 2024-01-23 NOTE — Progress Notes (Signed)
 Daily Session Note  Patient Details  Name: Amber Holder MRN: [id number] Date of Birth: 09/11/1945 Referring Provider:   Flowsheet Row Pulmonary Rehab from 01/17/2024 in [facility] Cardiac and Pulmonary Rehab  Referring Provider Dr. Cam Cava, MD       Encounter Date: 01/23/2024  Check In:  Session Check In - 01/23/24 1129       Check-In   Supervising physician immediately available to respond to emergencies See telemetry face sheet for immediately available ER MD    Location [facility]-Cardiac & Pulmonary Rehab    Staff Present Maud Sorenson, RN, BSN, CCRP;Joseph Hood RCP,RRT,BSRT;Maxon Converse BS, Exercise Physiologist;Noah Tickle, BS, Exercise Physiologist    Virtual Visit No    Medication changes reported     No    Fall or balance concerns reported    No    Warm-up and Cool-down Performed on first and last piece of equipment    Resistance Training Performed Yes    VAD Patient? No    PAD/SET Patient? No      Pain Assessment   Currently in Pain? No/denies                Social History   Tobacco Use  Smoking Status Former   Current packs/day: 0.00   Average packs/day: 1 pack/day for 50.0 years (50.0 ttl pk-yrs)   Types: Cigarettes   Start date: 12/29/1971   Quit date: 12/28/2021   Years since quitting: 2.0   Passive exposure: Never  Smokeless Tobacco Never    Goals Met:  Proper associated with RPD/PD & O2 Sat Independence with exercise equipment Exercise tolerated well No report of concerns or symptoms today  Goals Unmet:  Not Applicable  Comments: Pt able to follow exercise prescription today without complaint.  Will continue to monitor for progression.  First full day of exercise!  Patient was oriented to gym and equipment including functions, settings, policies, and procedures.  Patient's individual exercise prescription and treatment plan were reviewed.  All starting workloads were established based on the results of the 6 minute walk test done at initial  orientation visit.  The plan for exercise progression was also introduced and progression will be customized based on patient's performance and goals.   Dr. Firman Hughes is Medical Director for [facility] Cardiac Rehabilitation.  Dr. Fuad Aleskerov is Medical Director for [facility] Pulmonary Rehabilitation.

## 2024-01-28 ENCOUNTER — Encounter: Attending: Internal Medicine

## 2024-01-28 DIAGNOSIS — J449 Chronic obstructive pulmonary disease, unspecified: Secondary | ICD-10-CM | POA: Diagnosis not present

## 2024-01-28 DIAGNOSIS — I2721 Secondary pulmonary arterial hypertension: Secondary | ICD-10-CM | POA: Diagnosis not present

## 2024-01-28 LAB — GLUCOSE, CAPILLARY
Glucose-Capillary: 124 mg/dL — ABNORMAL HIGH (ref 70–99)
Glucose-Capillary: 102 mg/dL — ABNORMAL HIGH (ref 70–99)

## 2024-01-28 NOTE — Progress Notes (Signed)
 Daily Session Note  Patient Details  Name: Amber Holder MRN: [id number] Date of Birth: September 25, 1945 Referring Provider:   Flowsheet Row Pulmonary Rehab from 01/17/2024 in [facility] Cardiac and Pulmonary Rehab  Referring Provider Dr. Cam Cava, MD       Encounter Date: 01/28/2024  Check In:  Session Check In - 01/28/24 1115       Check-In   Supervising physician immediately available to respond to emergencies See telemetry face sheet for immediately available ER MD    Location [facility]-Cardiac & Pulmonary Rehab    Staff Present Lyell Samuel, MS, Exercise Physiologist;Neils [place] Dawne Euler, ACSM CEP, Exercise Physiologist;Roney Youtz RN,BSN,MPA;Joseph Hood RCP,RRT,BSRT    Virtual Visit No    Medication changes reported     No    Fall or balance concerns reported    No    Tobacco Cessation No Change    Warm-up and Cool-down Performed on first and last piece of equipment    Resistance Training Performed Yes    VAD Patient? No    PAD/SET Patient? No      Pain Assessment   Currently in Pain? No/denies                Social History   Tobacco Use  Smoking Status Former   Current packs/day: 0.00   Average packs/day: 1 pack/day for 50.0 years (50.0 ttl pk-yrs)   Types: Cigarettes   Start date: 12/29/1971   Quit date: 12/28/2021   Years since quitting: 2.0   Passive exposure: Never  Smokeless Tobacco Never    Goals Met:  Independence with exercise equipment Exercise tolerated well No report of concerns or symptoms today Strength training completed today  Goals Unmet:  Not Applicable  Comments: Pt able to follow exercise prescription today without complaint.  Will continue to monitor for progression.    Dr. Firman Hughes is Medical Director for [facility] Cardiac Rehabilitation.  Dr. Fuad Aleskerov is Medical Director for [facility] Pulmonary Rehabilitation.

## 2024-01-30 ENCOUNTER — Encounter: Payer: Self-pay | Admitting: *Deleted

## 2024-01-30 ENCOUNTER — Encounter

## 2024-01-30 DIAGNOSIS — I2721 Secondary pulmonary arterial hypertension: Secondary | ICD-10-CM

## 2024-01-30 DIAGNOSIS — J449 Chronic obstructive pulmonary disease, unspecified: Secondary | ICD-10-CM | POA: Diagnosis not present

## 2024-01-30 LAB — GLUCOSE, CAPILLARY
Glucose-Capillary: 107 mg/dL — ABNORMAL HIGH (ref 70–99)
Glucose-Capillary: 89 mg/dL (ref 70–99)

## 2024-01-30 NOTE — Progress Notes (Signed)
 Pulmonary Individual Treatment Plan  Patient Details  Name: Amber Holder MRN: [id number] Date of Birth: 1945/12/21 Referring Provider:   Flowsheet Row Pulmonary Rehab from 01/17/2024 in [facility] Cardiac and Pulmonary Rehab  Referring Provider Dr. Cam Cava, MD       Initial Encounter Date:  Flowsheet Row Pulmonary Rehab from 01/17/2024 in [facility] Cardiac and Pulmonary Rehab  Date 01/17/24       Visit Diagnosis: Pulmonary artery hypertension (HCC)  Patient's Home Medications on Admission:  Current Outpatient Medications:    acetaminophen  (TYLENOL ) 500 MG chewable tablet, Chew 1,000 mg by mouth every 8 (eight) hours as needed for pain., Disp: , Rfl:    albuterol  (VENTOLIN  HFA) 108 (90 Base) MCG/ACT inhaler, Inhale 2 puffs into the lungs every 6 (six) hours as needed for wheezing or shortness of breath., Disp: 18 g, Rfl: 2   aspirin  EC 81 MG tablet, Take 1 tablet (81 mg total) by mouth daily. Swallow whole., Disp: 90 tablet, Rfl: 3   atorvastatin  (LIPITOR) 40 MG tablet, Take 1 tablet (40 mg total) by mouth daily. (Patient not taking: Reported on 01/15/2024), Disp: 90 tablet, Rfl: 3   cholecalciferol  (VITAMIN D ) 400 UNITS TABS tablet, Take 2,000 Units by mouth daily., Disp: , Rfl:    CRANBERRY PO, Take 25,000 mg by mouth daily., Disp: , Rfl:    diclofenac  Sodium (VOLTAREN ) 1 % GEL, Apply small amount to area of pain daily as needed, Disp: 2 g, Rfl: 0   ergocalciferol  (DRISDOL ) 1.25 MG (50000 UT) capsule, Take one cap q week, Disp: 12 capsule, Rfl: 3   estradiol  (ESTRACE ) 0.1 MG/GM vaginal cream, Estrogen Cream Instruction Discard applicator Apply pea sized amount to tip of finger to urethra before bed. Wash hands well after application. Use Monday, Wednesday and Friday, Disp: 42.5 g, Rfl: 12   FARXIGA  10 MG TABS tablet, TAKE 1 TABLET BY MOUTH DAILY BEFORE BREAKFAST., Disp: 90 tablet, Rfl: 3   fluconazole  (DIFLUCAN ) 150 MG tablet, Take one tab po q week for fungal infection and as needed, Disp:  12 tablet, Rfl: 1   Fluticasone -Umeclidin-Vilant (TRELEGY ELLIPTA ) 100-62.5-25 MCG/ACT AEPB, Use inhale  orally daily, Disp: 3 each, Rfl: 4   glucose blood (ONETOUCH VERIO) test strip, BLOOD SUGAR TESTING ONCE DAILY . DX E11.65, Disp: 100 strip, Rfl: 3   hydrocortisone  cream 0.5 %, Apply topically as needed., Disp: 30 g, Rfl: 0   ibandronate  (BONIVA ) 150 MG tablet, TAKE 1 TABLET (150 MG TOTAL) BY MOUTH EVERY 30 (THIRTY) DAYS., Disp: 3 tablet, Rfl: 3   ketoconazole (NIZORAL) 2 % cream, Apply 1 Application topically daily., Disp: , Rfl:    Lancets (ONETOUCH DELICA PLUS LANCET30G) MISC, Use  as directed twice a daily DX E11.65, Disp: 100 each, Rfl: 3   macitentan  (OPSUMIT ) 10 MG tablet, Take 1 tablet (10 mg total) by mouth daily., Disp: 90 tablet, Rfl: 5   Magnesium  250 MG TABS, Take by mouth. Takes 1 tablet 2-3 times per week, Disp: , Rfl:    montelukast  (SINGULAIR ) 10 MG tablet, Take 1 tablet (10 mg total) by mouth daily as needed., Disp: 90 tablet, Rfl: 1   Multiple Vitamins-Minerals (MULTIVITAMIN ADULTS PO), Take 1 tablet by mouth daily., Disp: , Rfl:    omeprazole  (PRILOSEC) 40 MG capsule, TAKE 1 CAPSULE (40 MG TOTAL) BY MOUTH DAILY., Disp: 90 capsule, Rfl: 3   OXYGEN , Inhale 3 L into the lungs. Pt uses [facility] for Oxygen , Disp: , Rfl:    potassium chloride  SA (KLOR-CON   M) 20 MEQ tablet, Take 2 tablets (40 mEq total) by mouth daily., Disp: 180 tablet, Rfl: 3   spironolactone  (ALDACTONE ) 25 MG tablet, Take 1 tablet (25 mg total) by mouth daily., Disp: 90 tablet, Rfl: 3   Torsemide  40 MG TABS, Take 40 mg by mouth daily., Disp: 90 tablet, Rfl: 3  Current Facility-Administered Medications:    nitrofurantoin  (macrocrystal-monohydrate) (MACROBID ) capsule 100 mg, 100 mg, Oral, Q12H, McDonough, Lillie Reining, PA-C  Past Medical History: Past Medical History:  Diagnosis Date   Anxiety 01/02/2012   Arthritis    Asthma    Blockage of coronary artery of heart (HCC)    Chronic kidney disease     Kidney stones   Colon polyps    COPD (chronic obstructive pulmonary disease) (HCC)    Diabetes mellitus without complication (HCC)    GERD (gastroesophageal reflux disease)    HOH (hard of hearing)    Bilateral hearing aids   Kidney stone    Osteoporosis    Sleep apnea    Does not use C-PAP on a regular basis   Urinary tract infection    Venous stasis     Tobacco Use: Social History   Tobacco Use  Smoking Status Former   Current packs/day: 0.00   Average packs/day: 1 pack/day for 50.0 years (50.0 ttl pk-yrs)   Types: Cigarettes   Start date: 12/29/1971   Quit date: 12/28/2021   Years since quitting: 2.0   Passive exposure: Never  Smokeless Tobacco Never    Labs: Review Flowsheet  More data exists      Latest Ref Rng & Units 05/19/2022 07/07/2022 09/05/2022 06/11/2023 11/01/2023  Labs for ITP Cardiac and Pulmonary Rehab  Cholestrol 0 - 200 mg/dL 540  981  - 191  -  LDL (calc) 0 - 99 mg/dL 92  478  - 96  -  Direct LDL 0 - 99 mg/dL - - - 295  -  HDL-C >62 mg/dL 48  45  - 57  -  Trlycerides <150 mg/dL 130  865  - 79  -  Hemoglobin A1c 4.0 - 5.6 % - - 6.3  6.6  6.4      Pulmonary Assessment Scores:  Pulmonary Assessment Scores     Row Name 01/17/24 1149         ADL UCSD   ADL Phase Entry     SOB Score total 64     Rest 1     Walk 3     Stairs 4     Bath 3     Dress 2     Shop 3       CAT Score   CAT Score 14       mMRC Score   mMRC Score 2              UCSD: Self-administered rating of dyspnea associated with activities of daily living (ADLs) 6-point scale (0 = "not at all" to 5 = "maximal or unable to do because of breathlessness")  Scoring Scores range from 0 to 120.  Minimally important difference is 5 units  CAT: CAT can identify the health impairment of COPD patients and is better correlated with disease progression.  CAT has a scoring range of zero to 40. The CAT score is classified into four groups of low (less than 10), medium (10 - 20), high  (21-30) and very high (31-40) based on the impact level of disease on health status. A CAT score over 10  suggests significant symptoms.  A worsening CAT score could be explained by an exacerbation, poor medication adherence, poor inhaler technique, or progression of COPD or comorbid conditions.  CAT MCID is 2 points  mMRC: mMRC (Modified Medical Research Council) Dyspnea Scale is used to assess the degree of baseline functional disability in patients of respiratory disease due to dyspnea. No minimal important difference is established. A decrease in score of 1 point or greater is considered a positive change.   Pulmonary Function Assessment:   Exercise Target Goals: Exercise Program Goal: Individual exercise prescription set using results from initial 6 min walk test and THRR while considering  patient's activity barriers and safety.   Exercise Prescription Goal: Initial exercise prescription builds to 30-45 minutes a day of aerobic activity, 2-3 days per week.  Home exercise guidelines will be given to patient during program as part of exercise prescription that the participant will acknowledge.  Education: Aerobic Exercise: - Group verbal and visual presentation on the components of exercise prescription. Introduces F.I.T.T principle from ACSM for exercise prescriptions.  Reviews F.I.T.T. principles of aerobic exercise including progression. Written material given at graduation. Flowsheet Row Pulmonary Rehab from 01/23/2024 in [facility] Cardiac and Pulmonary Rehab  Date 01/23/24  Educator NT  Instruction Review Code 1- Verbalizes Understanding       Education: Resistance Exercise: - Group verbal and visual presentation on the components of exercise prescription. Introduces F.I.T.T principle from ACSM for exercise prescriptions  Reviews F.I.T.T. principles of resistance exercise including progression. Written material given at graduation.    Education: Exercise & Equipment Safety: -  Individual verbal instruction and demonstration of equipment use and safety with use of the equipment. Flowsheet Row Pulmonary Rehab from 01/23/2024 in [facility] Cardiac and Pulmonary Rehab  Date 01/17/24  Educator NT  Instruction Review Code 1- Verbalizes Understanding       Education: Exercise Physiology & General Exercise Guidelines: - Group verbal and written instruction with models to review the exercise physiology of the cardiovascular system and associated critical values. Provides general exercise guidelines with specific guidelines to those with heart or lung disease.  Flowsheet Row Pulmonary Rehab from 08/23/2022 in [facility] Cardiac and Pulmonary Rehab  Date 06/28/22  Educator [facility]  Instruction Review Code 1- Verbalizes Understanding       Education: Flexibility, Balance, Mind/Body Relaxation: - Group verbal and visual presentation with interactive activity on the components of exercise prescription. Introduces F.I.T.T principle from ACSM for exercise prescriptions. Reviews F.I.T.T. principles of flexibility and balance exercise training including progression. Also discusses the mind body connection.  Reviews various relaxation techniques to help reduce and manage stress (i.e. Deep breathing, progressive muscle relaxation, and visualization). Balance handout provided to take home. Written material given at graduation.   Activity Barriers & Risk Stratification:  Activity Barriers & Cardiac Risk Stratification - 01/17/24 1150       Activity Barriers & Cardiac Risk Stratification   Activity Barriers Right Hip Replacement;Shortness of Breath;Muscular Weakness;Balance Concerns;Arthritis;Back Problems             6 Minute Walk:  6 Minute Walk     Row Name 01/17/24 1151         6 Minute Walk   Phase Initial     Distance 1050 feet     Walk Time 6 minutes     # of Rest Breaks 0     MPH 1.99     METS 1.58     RPE 13     Perceived Dyspnea  2     VO2 Peak 5.51     Symptoms Yes  (comment)     Comments R leg weakness     Resting HR 62 bpm     Resting BP 132/64     Resting Oxygen  Saturation  93 %     Exercise Oxygen  Saturation  during 6 min walk 87 %     Max Ex. HR 84 bpm     Max Ex. BP 132/64     2 Minute Post BP 114/58       Interval HR   1 Minute HR 76     2 Minute HR 80     3 Minute HR 81     4 Minute HR 81     5 Minute HR 84     6 Minute HR 83     2 Minute Post HR 63     Interval Heart Rate? Yes       Interval Oxygen    Interval Oxygen ? Yes     Baseline Oxygen  Saturation % 93 %     1 Minute Oxygen  Saturation % 91 %     1 Minute Liters of Oxygen  2 L     2 Minute Oxygen  Saturation % 88 %     2 Minute Liters of Oxygen  2 L     3 Minute Oxygen  Saturation % 87 %     3 Minute Liters of Oxygen  2 L     4 Minute Oxygen  Saturation % 87 %     4 Minute Liters of Oxygen  2 L     5 Minute Oxygen  Saturation % 87 %     5 Minute Liters of Oxygen  2 L     6 Minute Oxygen  Saturation % 87 %     6 Minute Liters of Oxygen  2 L     2 Minute Post Oxygen  Saturation % 94 %     2 Minute Post Liters of Oxygen  2 L             Oxygen  Initial Assessment:  Oxygen  Initial Assessment - 01/15/24 1302       Home Oxygen    Home Oxygen  Device Home Concentrator;E-Tanks;Portable Concentrator    Sleep Oxygen  Prescription Continuous    Liters per minute 2    Home Exercise Oxygen  Prescription Continuous    Liters per minute 3   2-3   Home Resting Oxygen  Prescription Continuous    Liters per minute 2.5    Compliance with Home Oxygen  Use Yes             Oxygen  Re-Evaluation:  Oxygen  Re-Evaluation     Row Name 01/23/24 1134             Program Oxygen  Prescription   Program Oxygen  Prescription Continuous       Liters per minute 2         Home Oxygen    Home Oxygen  Device Home Concentrator;E-Tanks;Portable Concentrator       Sleep Oxygen  Prescription Continuous       Liters per minute 2       Home Exercise Oxygen  Prescription Continuous       Liters per minute 3        Home Resting Oxygen  Prescription Continuous       Liters per minute 2.5       Compliance with Home Oxygen  Use Yes         Goals/Expected Outcomes   Short Term Goals To learn and demonstrate  proper pursed lip breathing techniques or other breathing techniques.        Long  Term Goals Exhibits proper breathing techniques, such as pursed lip breathing or other method taught during program session       Comments Reviewed PLB technique with pt.  Talked about how it works and it's importance in maintaining their exercise saturations.       Goals/Expected Outcomes Short: Become more profiecient at using PLB. Long: Become independent at using PLB.                Oxygen  Discharge (Final Oxygen  Re-Evaluation):  Oxygen  Re-Evaluation - 01/23/24 1134       Program Oxygen  Prescription   Program Oxygen  Prescription Continuous    Liters per minute 2      Home Oxygen    Home Oxygen  Device Home Concentrator;E-Tanks;Portable Concentrator    Sleep Oxygen  Prescription Continuous    Liters per minute 2    Home Exercise Oxygen  Prescription Continuous    Liters per minute 3    Home Resting Oxygen  Prescription Continuous    Liters per minute 2.5    Compliance with Home Oxygen  Use Yes      Goals/Expected Outcomes   Short Term Goals To learn and demonstrate proper pursed lip breathing techniques or other breathing techniques.     Long  Term Goals Exhibits proper breathing techniques, such as pursed lip breathing or other method taught during program session    Comments Reviewed PLB technique with pt.  Talked about how it works and it's importance in maintaining their exercise saturations.    Goals/Expected Outcomes Short: Become more profiecient at using PLB. Long: Become independent at using PLB.             Initial Exercise Prescription:  Initial Exercise Prescription - 01/17/24 1100       Date of Initial Exercise RX and Referring Provider   Date 01/17/24    Referring Provider Dr.  Cam Cava, MD      Oxygen    Oxygen  Continuous    Liters 2-3    Maintain Oxygen  Saturation 88% or higher      Treadmill   MPH 1.8    Grade 0    Minutes 15    METs 2.38      Recumbant Bike   Level 1    RPM 50    Watts 15    Minutes 15    METs 1.58      NuStep   Level 2    SPM 80    Minutes 15    METs 1.58      Prescription Details   Frequency (times per week) 2    Duration Progress to 30 minutes of continuous aerobic without signs/symptoms of physical distress      Intensity   THRR 40-80% of Max Heartrate 94-126    Ratings of Perceived Exertion 11-13    Perceived Dyspnea 0-4      Progression   Progression Continue to progress workloads to maintain intensity without signs/symptoms of physical distress.      Resistance Training   Training Prescription Yes    Weight 3 lb    Reps 10-15             Perform Capillary Blood Glucose checks as needed.  Exercise Prescription Changes:   Exercise Prescription Changes     Row Name 01/17/24 1100             Response to Exercise   Blood  Pressure (Admit) 120/60       Blood Pressure (Exercise) 132/64       Blood Pressure (Exit) 114/58       Heart Rate (Admit) 62 bpm       Heart Rate (Exercise) 84 bpm       Heart Rate (Exit) 63 bpm       Oxygen  Saturation (Admit) 93 %       Oxygen  Saturation (Exercise) 87 %       Oxygen  Saturation (Exit) 94 %       Rating of Perceived Exertion (Exercise) 13       Perceived Dyspnea (Exercise) 2       Symptoms R leg weakness       Comments Results                Exercise Comments:   Exercise Comments     Row Name 01/23/24 1133           Exercise Comments First full day of exercise!  Patient was oriented to gym and equipment including functions, settings, policies, and procedures.  Patient's individual exercise prescription and treatment plan were reviewed.  All starting workloads were established based on the results of the 6 minute walk test done at initial  orientation visit.  The plan for exercise progression was also introduced and progression will be customized based on patient's performance and goals.                Exercise Goals and Review:   Exercise Goals     Row Name 01/17/24 1150             Exercise Goals   Increase Physical Activity Yes       Intervention Provide advice, education, support and counseling about physical activity/exercise needs.;Develop an individualized exercise prescription for aerobic and resistive training based on initial evaluation findings, risk stratification, comorbidities and participant's personal goals.       Expected Outcomes Short Term: Attend rehab on a regular basis to increase amount of physical activity.;Long Term: Add in home exercise to make exercise part of routine and to increase amount of physical activity.;Long Term: Exercising regularly at least 3-5 days a week.       Increase Strength and Stamina Yes       Intervention Provide advice, education, support and counseling about physical activity/exercise needs.;Develop an individualized exercise prescription for aerobic and resistive training based on initial evaluation findings, risk stratification, comorbidities and participant's personal goals.       Expected Outcomes Short Term: Increase workloads from initial exercise prescription for resistance, speed, and METs.;Short Term: Perform resistance training exercises routinely during rehab and add in resistance training at home;Long Term: Improve cardiorespiratory fitness, muscular endurance and strength as measured by increased METs and functional capacity ( )       Able to understand and use rate of perceived exertion (RPE) scale Yes       Intervention Provide education and explanation on how to use RPE scale       Expected Outcomes Short Term: Able to use RPE daily in rehab to express subjective intensity level;Long Term:  Able to use RPE to guide intensity level when exercising  independently       Able to understand and use Dyspnea scale Yes       Intervention Provide education and explanation on how to use Dyspnea scale       Expected Outcomes Short Term: Able to use Dyspnea scale daily  in rehab to express subjective sense of shortness of breath during exertion;Long Term: Able to use Dyspnea scale to guide intensity level when exercising independently       Knowledge and understanding of Target Heart Rate Range (THRR) Yes       Intervention Provide education and explanation of THRR including how the numbers were predicted and where they are located for reference       Expected Outcomes Short Term: Able to state/look up THRR;Long Term: Able to use THRR to govern intensity when exercising independently;Short Term: Able to use daily as guideline for intensity in rehab       Able to check pulse independently Yes       Intervention Provide education and demonstration on how to check pulse in carotid and radial arteries.;Review the importance of being able to check your own pulse for safety during independent exercise       Expected Outcomes Short Term: Able to explain why pulse checking is important during independent exercise;Long Term: Able to check pulse independently and accurately       Understanding of Exercise Prescription Yes       Intervention Provide education, explanation, and written materials on patient's individual exercise prescription       Expected Outcomes Short Term: Able to explain program exercise prescription;Long Term: Able to explain home exercise prescription to exercise independently                Exercise Goals Re-Evaluation :  Exercise Goals Re-Evaluation     Row Name 01/23/24 1135             Exercise Goal Re-Evaluation   Exercise Goals Review Able to understand and use rate of perceived exertion (RPE) scale;Able to understand and use Dyspnea scale;Knowledge and understanding of Target Heart Rate Range (THRR);Understanding of Exercise  Prescription       Comments Reviewed RPE and dyspnea scale, THR and program prescription with pt today.  Pt voiced understanding and was given a copy of goals to take home.       Expected Outcomes Short: Use RPE daily to regulate intensity. Long: Follow program prescription in THR.                Discharge Exercise Prescription (Final Exercise Prescription Changes):  Exercise Prescription Changes - 01/17/24 1100       Response to Exercise   Blood Pressure (Admit) 120/60    Blood Pressure (Exercise) 132/64    Blood Pressure (Exit) 114/58    Heart Rate (Admit) 62 bpm    Heart Rate (Exercise) 84 bpm    Heart Rate (Exit) 63 bpm    Oxygen  Saturation (Admit) 93 %    Oxygen  Saturation (Exercise) 87 %    Oxygen  Saturation (Exit) 94 %    Rating of Perceived Exertion (Exercise) 13    Perceived Dyspnea (Exercise) 2    Symptoms R leg weakness    Comments Results             Nutrition:  Target Goals: Understanding of nutrition guidelines, daily intake of sodium 1500mg , cholesterol 200mg , calories 30% from fat and 7% or less from saturated fats, daily to have 5 or more servings of fruits and vegetables.  Education: All About Nutrition: -Group instruction provided by verbal, written material, interactive activities, discussions, models, and posters to present general guidelines for heart healthy nutrition including fat, fiber, MyPlate, the role of sodium in heart healthy nutrition, utilization of the nutrition label, and utilization of  this knowledge for meal planning. Follow up email sent as well. Written material given at graduation. Flowsheet Row Pulmonary Rehab from 01/23/2024 in [facility] Cardiac and Pulmonary Rehab  Education need identified 01/17/24       Biometrics:  Pre Biometrics - 01/17/24 1151       Pre Biometrics   Height 5' 4.25" (1.632 m)    Weight 188 lb 14.4 oz (85.7 kg)    Waist Circumference 40 inches    Hip Circumference 45 inches    Waist to Hip Ratio  0.89 %    BMI (Calculated) 32.17    Single Leg Stand 1.6 seconds              Nutrition Therapy Plan and Nutrition Goals:  Nutrition Therapy & Goals - 01/17/24 1124       Nutrition Therapy   Diet carb controlled, Cardiac Low Na    Protein (specify units) 90    Fiber 25 grams    Whole Grain Foods 3 servings    Saturated Fats 15 max. grams    Fruits and Vegetables 5 servings/day    Sodium 2 grams      Personal Nutrition Goals   Nutrition Goal Eat 15-30gProtein and 30-60gCarbs at each meal.    Personal Goal #2 Read labels and reduce sodium intake to below 2300mg . Ideally 1500mg  per day.    Comments Patient drinking mostly water, getting ~40-64oz daily. She says she usually eats 3 meals per day. Reviewed her food recall, educated on importance of pairing quality carbs with protein and healthy fat. She already monitors her sodium intake, says she doesn't cook or use it at the table. Encouraged her to read labels and try and keep sodium below 1500mg . Provided Mediterranean diet handout. Educated on types of fats, sources, and how to read labels. Brainstormed several meals and snack ideas and went over sample plate handout.      Intervention Plan   Intervention Prescribe, educate and counsel regarding individualized specific dietary modifications aiming towards targeted core components such as weight, hypertension, lipid management, diabetes, heart failure and other comorbidities.;Nutrition handout(s) given to patient.    Expected Outcomes Short Term Goal: Understand basic principles of dietary content, such as calories, fat, sodium, cholesterol and nutrients.;Short Term Goal: A plan has been developed with personal nutrition goals set during dietitian appointment.;Long Term Goal: Adherence to prescribed nutrition plan.             Nutrition Assessments:  MEDIFICTS Score Key: >=70 Need to make dietary changes  40-70 Heart Healthy Diet <= 40 Therapeutic Level Cholesterol  Diet  Flowsheet Row Pulmonary Rehab from 01/17/2024 in [facility] Cardiac and Pulmonary Rehab  Picture Your Plate Total Score on Admission 58      Picture Your Plate Scores: <21 Unhealthy dietary pattern with much room for improvement. 41-50 Dietary pattern unlikely to meet recommendations for good health and room for improvement. 51-60 More healthful dietary pattern, with some room for improvement.  >60 Healthy dietary pattern, although there may be some specific behaviors that could be improved.   Nutrition Goals Re-Evaluation:   Nutrition Goals Discharge (Final Nutrition Goals Re-Evaluation):   Psychosocial: Target Goals: Acknowledge presence or absence of significant depression and/or stress, maximize coping skills, provide positive support system. Participant is able to verbalize types and ability to use techniques and skills needed for reducing stress and depression.   Education: Stress, Anxiety, and Depression - Group verbal and visual presentation to define topics covered.  Reviews how body is  impacted by stress, anxiety, and depression.  Also discusses healthy ways to reduce stress and to treat/manage anxiety and depression.  Written material given at graduation. Flowsheet Row Pulmonary Rehab from 08/23/2022 in [facility] Cardiac and Pulmonary Rehab  Date 06/21/22  Educator [facility]  Instruction Review Code 1- [employer] Understanding       Education: Sleep Hygiene -Provides group verbal and written instruction about how sleep can affect your health.  Define sleep hygiene, discuss sleep cycles and impact of sleep habits. Review good sleep hygiene tips.    Initial Review & Psychosocial Screening:  Initial Psych Review & Screening - 01/15/24 1309       Initial Review   Current issues with Current Depression      Family Dynamics   Good Support System? Yes   friends     Barriers   Psychosocial barriers to participate in program The patient should benefit from training in stress  management and relaxation.;There are no identifiable barriers or psychosocial needs.      Screening Interventions   Interventions Encouraged to exercise;To provide support and resources with identified psychosocial needs    Expected Outcomes Short Term goal: Utilizing psychosocial counselor, staff and physician to assist with identification of specific Stressors or current issues interfering with healing process. Setting desired goal for each stressor or current issue identified.;Long Term Goal: Stressors or current issues are controlled or eliminated.;Short Term goal: Identification and review with participant of any Quality of Life or Depression concerns found by scoring the questionnaire.;Long Term goal: The participant improves quality of Life and PHQ9 Scores as seen by post scores and/or verbalization of changes             Quality of Life Scores:  Scores of 19 and below usually indicate a poorer quality of life in these areas.  A difference of  2-3 points is a clinically meaningful difference.  A difference of 2-3 points in the total score of the Quality of Life Index has been associated with significant improvement in overall quality of life, self-image, physical symptoms, and general health in studies assessing change in quality of life.  PHQ-9: Review Flowsheet  More data exists      01/17/2024 11/01/2023 06/07/2023 02/12/2023 12/12/2022  Depression screen PHQ 2/9  Decreased Interest 0 0 0 0 0  Down, Depressed, Hopeless 1 0 0 0 0  PHQ - 2 Score 1 0 0 0 0  Altered sleeping 0 - - - -  Tired, decreased energy 2 - - - -  Change in appetite 0 - - - -  Feeling bad or failure about yourself  0 - - - -  Trouble concentrating 0 - - - -  Moving slowly or fidgety/restless 0 - - - -  Suicidal thoughts 0 - - - -  PHQ-9 Score 3 - - - -  Difficult doing work/chores Not difficult at all - - - -   Interpretation of Total Score  Total Score Depression Severity:  1-4 = Minimal depression, 5-9 =  Mild depression, 10-14 = Moderate depression, 15-19 = Moderately severe depression, 20-27 = Severe depression   Psychosocial Evaluation and Intervention:  Psychosocial Evaluation - 01/15/24 1324       Psychosocial Evaluation & Interventions   Comments Tahjae is returning to pulmonary rehab with pulmonary hypertension. She states she has been working on regulating her medications and prioritizing fluid balance. She wants to attend to work on her stamina and her breathing. She has a history of depression and  mentions that its a daily choice on how she is going to feel. She does have friends that help her as well as a monthly call from a support system through her insurance.    Expected Outcomes Short: attend pulmonary rehab for education and exercise Long: develop and maintain positive self care habits.    Continue Psychosocial Services  Follow up required by staff             Psychosocial Re-Evaluation:   Psychosocial Discharge (Final Psychosocial Re-Evaluation):   Education: Education Goals: Education classes will be provided on a weekly basis, covering required topics. Participant will state understanding/return demonstration of topics presented.  Learning Barriers/Preferences:   General Pulmonary Education Topics:  Infection Prevention: - Provides verbal and written material to individual with discussion of infection control including proper hand washing and proper equipment cleaning during exercise session. Flowsheet Row Pulmonary Rehab from 01/23/2024 in [facility] Cardiac and Pulmonary Rehab  Date 01/17/24  Educator NT  Instruction Review Code 1- Verbalizes Understanding       Falls Prevention: - Provides verbal and written material to individual with discussion of falls prevention and safety. Flowsheet Row Pulmonary Rehab from 01/23/2024 in [facility] Cardiac and Pulmonary Rehab  Date 01/17/24  Educator NT  Instruction Review Code 1- Verbalizes Understanding       Chronic  Lung Disease Review: - Group verbal instruction with posters, models, PowerPoint presentations and videos,  to review new updates, new respiratory medications, new advancements in procedures and treatments. Providing information on websites and "800" numbers for continued self-education. Includes information about supplement oxygen , available portable oxygen  systems, continuous and intermittent flow rates, oxygen  safety, concentrators, and Medicare reimbursement for oxygen . Explanation of Pulmonary Drugs, including class, frequency, complications, importance of spacers, rinsing mouth after steroid MDI's, and proper cleaning methods for nebulizers. Review of basic lung anatomy and physiology related to function, structure, and complications of lung disease. Review of risk factors. Discussion about methods for diagnosing sleep apnea and types of masks and machines for OSA. Includes a review of the use of types of environmental controls: home humidity, furnaces, filters, dust mite/pet prevention, HEPA vacuums. Discussion about weather changes, air quality and the benefits of nasal washing. Instruction on Warning signs, infection symptoms, calling MD promptly, preventive modes, and value of vaccinations. Review of effective airway clearance, coughing and/or vibration techniques. Emphasizing that all should Create an Action Plan. Written material given at graduation. Flowsheet Row Pulmonary Rehab from 01/23/2024 in [facility] Cardiac and Pulmonary Rehab  Education need identified 01/17/24       AED/CPR: - Group verbal and written instruction with the use of models to demonstrate the basic use of the AED with the basic ABC's of resuscitation.    Anatomy and Cardiac Procedures: - Group verbal and visual presentation and models provide information about basic cardiac anatomy and function. Reviews the testing methods done to diagnose heart disease and the outcomes of the test results. Describes the treatment choices:  Medical Management, Angioplasty, or Coronary Bypass Surgery for treating various heart conditions including Myocardial Infarction, Angina, Valve Disease, and Cardiac Arrhythmias.  Written material given at graduation.   Medication Safety: - Group verbal and visual instruction to review commonly prescribed medications for heart and lung disease. Reviews the medication, class of the drug, and side effects. Includes the steps to properly store meds and maintain the prescription regimen.  Written material given at graduation. Flowsheet Row Pulmonary Rehab from 08/23/2022 in [facility] Cardiac and Pulmonary Rehab  Date 05/31/22  Educator  SB  Instruction Review Code 1- Verbalizes Understanding       Other: -Provides group and verbal instruction on various topics (see comments)   Knowledge Questionnaire Score:  Knowledge Questionnaire Score - 01/17/24 1147       Knowledge Questionnaire Score   Pre Score 13/18              Core Components/Risk Factors/Patient Goals at Admission:  Personal Goals and Risk Factors at Admission - 01/15/24 1307       Core Components/Risk Factors/Patient Goals on Admission    Weight Management Yes;Weight Loss   fluid balance   Intervention Weight Management: Develop a combined nutrition and exercise program designed to reach desired caloric intake, while maintaining appropriate intake of nutrient and fiber, sodium and fats, and appropriate energy expenditure required for the weight goal.;Weight Management: Provide education and appropriate resources to help participant work on and attain dietary goals.;Weight Management/Obesity: Establish reasonable short term and long term weight goals.;Obesity: Provide education and appropriate resources to help participant work on and attain dietary goals.    Expected Outcomes Short Term: Continue to assess and modify interventions until short term weight is achieved;Long Term: Adherence to nutrition and physical  activity/exercise program aimed toward attainment of established weight goal;Weight Loss: Understanding of general recommendations for a balanced deficit meal plan, which promotes 1-2 lb weight loss per week and includes a negative energy balance of [phone] kcal/d;Understanding recommendations for meals to include 15-35% energy as protein, 25-35% energy from fat, 35-60% energy from carbohydrates, less than 200mg  of dietary cholesterol, 20-35 gm of total fiber daily;Understanding of distribution of calorie intake throughout the day with the consumption of 4-5 meals/snacks    Improve shortness of breath with ADL's Yes    Intervention Provide education, individualized exercise plan and daily activity instruction to help decrease symptoms of SOB with activities of daily living.    Expected Outcomes Short Term: Improve cardiorespiratory fitness to achieve a reduction of symptoms when performing ADLs;Long Term: Be able to perform more ADLs without symptoms or delay the onset of symptoms    Diabetes Yes    Intervention Provide education about signs/symptoms and action to take for hypo/hyperglycemia.;Provide education about proper nutrition, including hydration, and aerobic/resistive exercise prescription along with prescribed medications to achieve blood glucose in normal ranges: Fasting glucose 65-99 mg/dL    Expected Outcomes Short Term: Participant verbalizes understanding of the signs/symptoms and immediate care of hyper/hypoglycemia, proper foot care and importance of medication, aerobic/resistive exercise and nutrition plan for blood glucose control.;Long Term: Attainment of HbA1C < 7%.    Heart Failure Yes    Intervention Provide a combined exercise and nutrition program that is supplemented with education, support and counseling about heart failure. Directed toward relieving symptoms such as shortness of breath, decreased exercise tolerance, and extremity edema.    Expected Outcomes Improve functional  capacity of life;Short term: Attendance in program 2-3 days a week with increased exercise capacity. Reported lower sodium intake. Reported increased fruit and vegetable intake. Reports medication compliance.;Short term: Daily weights obtained and reported for increase. Utilizing diuretic protocols set by physician.;Long term: Adoption of self-care skills and reduction of barriers for early signs and symptoms recognition and intervention leading to self-care maintenance.             Education:Diabetes - Individual verbal and written instruction to review signs/symptoms of diabetes, desired ranges of glucose level fasting, after meals and with exercise. Acknowledge that pre and post exercise glucose checks will be done for  3 sessions at entry of program. Flowsheet Row Pulmonary Rehab from 08/23/2022 in [facility] Cardiac and Pulmonary Rehab  Date 05/15/22  Educator [facility]  Instruction Review Code 1- Verbalizes Understanding       Know Your Numbers and Heart Failure: - Group verbal and visual instruction to discuss disease risk factors for cardiac and pulmonary disease and treatment options.  Reviews associated critical values for Overweight/Obesity, Hypertension, Cholesterol, and Diabetes.  Discusses basics of heart failure: signs/symptoms and treatments.  Introduces Heart Failure Zone chart for action plan for heart failure.  Written material given at graduation. Flowsheet Row Pulmonary Rehab from 08/23/2022 in [facility] Cardiac and Pulmonary Rehab  Date 06/07/22  Educator SB  Instruction Review Code 1- Verbalizes Understanding       Core Components/Risk Factors/Patient Goals Review:    Core Components/Risk Factors/Patient Goals at Discharge (Final Review):    ITP Comments:  ITP Comments     Row Name 01/15/24 1320 01/17/24 1145 01/23/24 1133 01/30/24 1050     ITP Comments Initial phone call completed. Diagnosis can be found in [facility] 5/12. EP Orientation scheduled for Thursday 5/22 at 9:30am.  Completed and gym orientation for respiratory care services. Initial ITP created and sent for review to Dr. Faud Aleskerov, Medical Director. First full day of exercise!  Patient was oriented to gym and equipment including functions, settings, policies, and procedures.  Patient's individual exercise prescription and treatment plan were reviewed.  All starting workloads were established based on the results of the 6 minute walk test done at initial orientation visit.  The plan for exercise progression was also introduced and progression will be customized based on patient's performance and goals. 30 Day review completed. Medical Director ITP review done, changes made as directed, and signed approval by Medical Director.    new to program             Comments:

## 2024-01-30 NOTE — Progress Notes (Signed)
 Daily Session Note  Patient Details  Name: Amber Holder MRN: [id number] Date of Birth: [birth year] Referring Provider:   Flowsheet Row Pulmonary Rehab from 01/17/2024 in [facility] Cardiac and Pulmonary Rehab  Referring Provider Dr. Cam Cava, MD       Encounter Date: 01/30/2024  Check In:  Session Check In - 01/30/24 1109       Check-In   Supervising physician immediately available to respond to emergencies See telemetry face sheet for immediately available ER MD    Location [facility]-Cardiac & Pulmonary Rehab    Staff Present Sherle Dire, BS, Exercise Physiologist;Jason Martina Sledge RDN,LDN;Chrishawna Farina RN,BSN,MPA;Joseph Lacinda Pica RCP,RRT,BSRT    Virtual Visit No    Medication changes reported     No    Fall or balance concerns reported    No    Tobacco Cessation No Change    Warm-up and Cool-down Performed on first and last piece of equipment    Resistance Training Performed Yes    VAD Patient? No    PAD/SET Patient? No      Pain Assessment   Currently in Pain? No/denies                Social History   Tobacco Use  Smoking Status Former   Current packs/day: 0.00   Average packs/day: 1 pack/day for 50.0 years (50.0 ttl pk-yrs)   Types: Cigarettes   Start date: 12/29/1971   Quit date: 12/28/2021   Years since quitting: 2.0   Passive exposure: Never  Smokeless Tobacco Never    Goals Met:  Independence with exercise equipment Exercise tolerated well No report of concerns or symptoms today Strength training completed today  Goals Unmet:  Not Applicable  Comments: Pt able to follow exercise prescription today without complaint.  Will continue to monitor for progression.    Dr. Firman Hughes is Medical Director for [facility] Cardiac Rehabilitation.  Dr. Fuad Aleskerov is Medical Director for [facility] Pulmonary Rehabilitation.

## 2024-02-04 ENCOUNTER — Encounter: Admitting: *Deleted

## 2024-02-04 DIAGNOSIS — I2721 Secondary pulmonary arterial hypertension: Secondary | ICD-10-CM | POA: Diagnosis not present

## 2024-02-04 DIAGNOSIS — J449 Chronic obstructive pulmonary disease, unspecified: Secondary | ICD-10-CM | POA: Diagnosis not present

## 2024-02-04 NOTE — Progress Notes (Signed)
 Daily Session Note  Patient Details  Name: Amber Holder MRN: [id number] Date of Birth: 1946/01/04 Referring Provider:   Flowsheet Row Pulmonary Rehab from 01/17/2024 in [facility] Cardiac and Pulmonary Rehab  Referring Provider Dr. Cam Cava, MD       Encounter Date: 02/04/2024  Check In:  Session Check In - 02/04/24 1136       Check-In   Supervising physician immediately available to respond to emergencies See telemetry face sheet for immediately available ER MD    Location [facility]-Cardiac & Pulmonary Rehab    Staff Present Maud Sorenson, RN, BSN, CCRP;Maxon Conetta BS, Exercise Physiologist;Meredith Manson Seitz RN,BSN;Kelly Bollinger [facility]    Virtual Visit No    Medication changes reported     No    Fall or balance concerns reported    No    Warm-up and Cool-down Performed on first and last piece of equipment    Resistance Training Performed Yes    VAD Patient? No    PAD/SET Patient? No      Pain Assessment   Currently in Pain? No/denies                Social History   Tobacco Use  Smoking Status Former   Current packs/day: 0.00   Average packs/day: 1 pack/day for 50.0 years (50.0 ttl pk-yrs)   Types: Cigarettes   Start date: 12/29/1971   Quit date: 12/28/2021   Years since quitting: 2.1   Passive exposure: Never  Smokeless Tobacco Never    Goals Met:  Proper associated with RPD/PD & O2 Sat Independence with exercise equipment Exercise tolerated well No report of concerns or symptoms today  Goals Unmet:  Not Applicable  Comments: Pt able to follow exercise prescription today without complaint.  Will continue to monitor for progression.    Dr. Firman Hughes is Medical Director for [facility] Cardiac Rehabilitation.  Dr. Fuad Aleskerov is Medical Director for [facility] Pulmonary Rehabilitation.

## 2024-02-06 ENCOUNTER — Encounter: Admitting: *Deleted

## 2024-02-06 DIAGNOSIS — I2721 Secondary pulmonary arterial hypertension: Secondary | ICD-10-CM

## 2024-02-06 DIAGNOSIS — J449 Chronic obstructive pulmonary disease, unspecified: Secondary | ICD-10-CM | POA: Diagnosis not present

## 2024-02-06 NOTE — Progress Notes (Signed)
 Daily Session Note  Patient Details  Name: Nataline Basara MRN: [id number] Date of Birth: Sep 12, 1945 Referring Provider:   Flowsheet Row Pulmonary Rehab from 01/17/2024 in [facility] Cardiac and Pulmonary Rehab  Referring Provider Dr. Cam Cava, MD       Encounter Date: 02/06/2024  Check In:  Session Check In - 02/06/24 1125       Check-In   Supervising physician immediately available to respond to emergencies See telemetry face sheet for immediately available ER MD    Location [facility]-Cardiac & Pulmonary Rehab    Staff Present Maud Sorenson, RN, BSN, CCRP;Meredith Manson Seitz RN,BSN;Noah Tickle, BS, Exercise Physiologist;Kelly Bollinger RN,BSN,MPA;Jason Martina Sledge RDN,LDN    Virtual Visit No    Medication changes reported     No    Fall or balance concerns reported    No    Warm-up and Cool-down Performed on first and last piece of equipment    Resistance Training Performed Yes    VAD Patient? No    PAD/SET Patient? No      Pain Assessment   Currently in Pain? No/denies                Social History   Tobacco Use  Smoking Status Former   Current packs/day: 0.00   Average packs/day: 1 pack/day for 50.0 years (50.0 ttl pk-yrs)   Types: Cigarettes   Start date: 12/29/1971   Quit date: 12/28/2021   Years since quitting: 2.1   Passive exposure: Never  Smokeless Tobacco Never    Goals Met:  Proper associated with RPD/PD & O2 Sat Independence with exercise equipment Exercise tolerated well No report of concerns or symptoms today  Goals Unmet:  Not Applicable  Comments: Pt able to follow exercise prescription today without complaint.  Will continue to monitor for progression.    Dr. Firman Hughes is Medical Director for [facility] Cardiac Rehabilitation.  Dr. Fuad Aleskerov is Medical Director for [facility] Pulmonary Rehabilitation.

## 2024-02-11 ENCOUNTER — Encounter: Admitting: *Deleted

## 2024-02-11 DIAGNOSIS — I2721 Secondary pulmonary arterial hypertension: Secondary | ICD-10-CM

## 2024-02-11 DIAGNOSIS — J449 Chronic obstructive pulmonary disease, unspecified: Secondary | ICD-10-CM | POA: Diagnosis not present

## 2024-02-11 NOTE — Progress Notes (Signed)
 Daily Session Note  Patient Details  Name: Amber Holder MRN: [id number] Date of Birth: 27-Aug-1946 Referring Provider:   Flowsheet Row Pulmonary Rehab from 01/17/2024 in [facility] Cardiac and Pulmonary Rehab  Referring Provider Dr. Cam Cava, MD    Encounter Date: 02/11/2024  Check In:  Session Check In - 02/11/24 1126       Check-In   Supervising physician immediately available to respond to emergencies See telemetry face sheet for immediately available ER MD    Location [facility]-Cardiac & Pulmonary Rehab    Staff Present Maud Sorenson, RN, BSN, CCRP;Joseph Hood RCP,RRT,BSRT;Maxon [employer], Exercise Physiologist;Kelly [employer], ACSM CEP, Exercise Physiologist    Virtual Visit No    Medication changes reported     No    Fall or balance concerns reported    No    Warm-up and Cool-down Performed on first and last piece of equipment    Resistance Training Performed Yes    VAD Patient? No    PAD/SET Patient? No      Pain Assessment   Currently in Pain? No/denies             Social History   Tobacco Use  Smoking Status Former   Current packs/day: 0.00   Average packs/day: 1 pack/day for 50.0 years (50.0 ttl pk-yrs)   Types: Cigarettes   Start date: 12/29/1971   Quit date: 12/28/2021   Years since quitting: 2.1   Passive exposure: Never  Smokeless Tobacco Never    Goals Met:  Proper associated with RPD/PD & O2 Sat Independence with exercise equipment Exercise tolerated well No report of concerns or symptoms today  Goals Unmet:  Not Applicable  Comments: Pt able to follow exercise prescription today without complaint.  Will continue to monitor for progression.    Dr. Firman Hughes is Medical Director for [facility] Cardiac Rehabilitation.  Dr. Fuad Aleskerov is Medical Director for [facility] Pulmonary Rehabilitation.

## 2024-02-13 ENCOUNTER — Encounter: Admitting: *Deleted

## 2024-02-13 DIAGNOSIS — I2721 Secondary pulmonary arterial hypertension: Secondary | ICD-10-CM | POA: Diagnosis not present

## 2024-02-13 DIAGNOSIS — J449 Chronic obstructive pulmonary disease, unspecified: Secondary | ICD-10-CM | POA: Diagnosis not present

## 2024-02-13 NOTE — Progress Notes (Signed)
 Daily Session Note  Patient Details  Name: Amber Holder MRN: [id number] Date of Birth: 1946-07-26 Referring Provider:   Flowsheet Row Pulmonary Rehab from 01/17/2024 in [facility] Cardiac and Pulmonary Rehab  Referring Provider Dr. Cam Cava, MD    Encounter Date: 02/13/2024  Check In:  Session Check In - 02/13/24 1136       Check-In   Supervising physician immediately available to respond to emergencies See telemetry face sheet for immediately available ER MD    Location [facility]-Cardiac & Pulmonary Rehab    Staff Present Maud Sorenson, RN, BSN, CCRP;Joseph Hood RCP,RRT,BSRT;Maxon [place] BS, Exercise Physiologist;Jason Martina Sledge RDN,LDN    Virtual Visit No    Medication changes reported     No    Fall or balance concerns reported    No    Warm-up and Cool-down Performed on first and last piece of equipment    Resistance Training Performed Yes    VAD Patient? No    PAD/SET Patient? No      Pain Assessment   Currently in Pain? No/denies             Social History   Tobacco Use  Smoking Status Former   Current packs/day: 0.00   Average packs/day: 1 pack/day for 50.0 years (50.0 ttl pk-yrs)   Types: Cigarettes   Start date: 12/29/1971   Quit date: 12/28/2021   Years since quitting: 2.1   Passive exposure: Never  Smokeless Tobacco Never    Goals Met:  Proper associated with RPD/PD & O2 Sat Independence with exercise equipment Exercise tolerated well No report of concerns or symptoms today  Goals Unmet:  Not Applicable  Comments: Pt able to follow exercise prescription today without complaint.  Will continue to monitor for progression.    Dr. Firman Hughes is Medical Director for [facility] Cardiac Rehabilitation.  Dr. Fuad Aleskerov is Medical Director for [facility] Pulmonary Rehabilitation.

## 2024-02-18 ENCOUNTER — Encounter: Admitting: *Deleted

## 2024-02-18 DIAGNOSIS — I2721 Secondary pulmonary arterial hypertension: Secondary | ICD-10-CM

## 2024-02-18 DIAGNOSIS — J449 Chronic obstructive pulmonary disease, unspecified: Secondary | ICD-10-CM | POA: Diagnosis not present

## 2024-02-18 NOTE — Progress Notes (Signed)
 Daily Session Note  Patient Details  Name: Amber Holder MRN: [id number] Date of Birth: 05-Apr-1946 Referring Provider:   Flowsheet Row Pulmonary Rehab from 01/17/2024 in [facility] Cardiac and Pulmonary Rehab  Referring Provider Dr. Elfreda Bathe, MD    Encounter Date: 02/18/2024  Check In:  Session Check In - 02/18/24 1203       Check-In   Supervising physician immediately available to respond to emergencies See telemetry face sheet for immediately available ER MD    Location [facility]-Cardiac & Pulmonary Rehab    Staff Present Othel Durand, RN, BSN, CCRP;Meredith Tressa RN,BSN;Kelly [place] BS, ACSM CEP, Exercise Physiologist;Kelly Bollinger [facility]    Virtual Visit No    Medication changes reported     No    Fall or balance concerns reported    No    Warm-up and Cool-down Performed on first and last piece of equipment    Resistance Training Performed Yes    VAD Patient? No    PAD/SET Patient? No      Pain Assessment   Currently in Pain? No/denies             Social History   Tobacco Use  Smoking Status Former   Current packs/day: 0.00   Average packs/day: 1 pack/day for 50.0 years (50.0 ttl pk-yrs)   Types: Cigarettes   Start date: 12/29/1971   Quit date: 12/28/2021   Years since quitting: 2.1   Passive exposure: Never  Smokeless Tobacco Never    Goals Met:  Proper associated with RPD/PD & O2 Sat Independence with exercise equipment Exercise tolerated well No report of concerns or symptoms today  Goals Unmet:  Not Applicable  Comments: Pt able to follow exercise prescription today without complaint.  Will continue to monitor for progression.    Dr. Oneil Pinal is Medical Director for [facility] Cardiac Rehabilitation.  Dr. Fuad Aleskerov is Medical Director for [facility] Pulmonary Rehabilitation.

## 2024-02-20 ENCOUNTER — Encounter

## 2024-02-20 DIAGNOSIS — I2721 Secondary pulmonary arterial hypertension: Secondary | ICD-10-CM | POA: Diagnosis not present

## 2024-02-20 DIAGNOSIS — J449 Chronic obstructive pulmonary disease, unspecified: Secondary | ICD-10-CM | POA: Diagnosis not present

## 2024-02-20 NOTE — Progress Notes (Signed)
 Daily Session Note  Patient Details  Name: Amber Holder MRN: [id number] Date of Birth: Mar 22, 1946 Referring Provider:   Flowsheet Row Pulmonary Rehab from 01/17/2024 in [facility] Cardiac and Pulmonary Rehab  Referring Provider Dr. Elfreda Bathe, MD    Encounter Date: 02/20/2024  Check In:  Session Check In - 02/20/24 1056       Check-In   Supervising physician immediately available to respond to emergencies See telemetry face sheet for immediately available ER MD    Location [facility]-Cardiac & Pulmonary Rehab    Staff Present Burnard Davenport RN,BSN,MPA;Joseph [facility] RCP,RRT,BSRT;Margaret Best, MS, Exercise Physiologist;Jason Elnor RDN,LDN    Virtual Visit No    Medication changes reported     No    Fall or balance concerns reported    No    Tobacco Cessation No Change    Warm-up and Cool-down Performed on first and last piece of equipment    Resistance Training Performed Yes    VAD Patient? No    PAD/SET Patient? No      Pain Assessment   Currently in Pain? No/denies             Social History   Tobacco Use  Smoking Status Former   Current packs/day: 0.00   Average packs/day: 1 pack/day for 50.0 years (50.0 ttl pk-yrs)   Types: Cigarettes   Start date: 12/29/1971   Quit date: 12/28/2021   Years since quitting: 2.1   Passive exposure: Never  Smokeless Tobacco Never    Goals Met:  Independence with exercise equipment Exercise tolerated well No report of concerns or symptoms today Strength training completed today  Goals Unmet:  Not Applicable  Comments: Pt able to follow exercise prescription today without complaint.  Will continue to monitor for progression.    Dr. Oneil Pinal is Medical Director for [facility] Cardiac Rehabilitation.  Dr. Fuad Aleskerov is Medical Director for [facility] Pulmonary Rehabilitation.

## 2024-02-25 ENCOUNTER — Ambulatory Visit: Attending: Cardiology | Admitting: Cardiology

## 2024-02-25 ENCOUNTER — Encounter: Payer: Self-pay | Admitting: Cardiology

## 2024-02-25 ENCOUNTER — Encounter

## 2024-02-25 VITALS — BP 110/60 | HR 61 | Ht 65.0 in | Wt 189.6 lb

## 2024-02-25 DIAGNOSIS — I2510 Atherosclerotic heart disease of native coronary artery without angina pectoris: Secondary | ICD-10-CM | POA: Diagnosis not present

## 2024-02-25 DIAGNOSIS — J449 Chronic obstructive pulmonary disease, unspecified: Secondary | ICD-10-CM | POA: Diagnosis not present

## 2024-02-25 DIAGNOSIS — I2721 Secondary pulmonary arterial hypertension: Secondary | ICD-10-CM | POA: Diagnosis not present

## 2024-02-25 DIAGNOSIS — R600 Localized edema: Secondary | ICD-10-CM | POA: Insufficient documentation

## 2024-02-25 DIAGNOSIS — E7800 Pure hypercholesterolemia, unspecified: Secondary | ICD-10-CM | POA: Diagnosis not present

## 2024-02-25 DIAGNOSIS — R252 Cramp and spasm: Secondary | ICD-10-CM | POA: Diagnosis not present

## 2024-02-25 MED ORDER — EZETIMIBE 10 MG PO TABS: 10.0000 mg | ORAL_TABLET | Freq: Every day | ORAL | 3 refills | Status: DC

## 2024-02-25 NOTE — Patient Instructions (Signed)
 Medication Instructions:  Your physician recommends the following medication changes.  STOP TAKING: Lipitor   START TAKING: Zetia  10 mg daily  *If you need a refill on your cardiac medications before your next appointment, please call your pharmacy*  Lab Work: Your provider would like for you to return in 6 months to have the following labs drawn: fasting lipid panel.  Please go to [facility] [address] ([facility]) [address], [place] [address] You do not need an appointment.  They are open from 8 am- 4:30 pm.  Lunch from 1:00 pm- 2:00 pm You do need to be fasting.  If you have labs (blood work) drawn today and your tests are completely normal, you will receive your results only by: MyChart Message (if you have MyChart) OR A paper copy in the mail If you have any lab test that is abnormal or we need to change your treatment, we will call you to review the results.  Testing/Procedures: No test ordered today   Follow-Up: At [facility], you and your health needs are our priority.  As part of our continuing mission to provide you with exceptional heart care, our providers are all part of one team.  This team includes your primary Cardiologist (physician) and Advanced Practice Providers or APPs (Physician Assistants and Nurse Practitioners) who all work together to provide you with the care you need, when you need it.  Your next appointment:   6 month(s)  Provider:   You may see Redell Cave, MD or one of the following Advanced Practice Providers on your designated Care Team:   Lonni Meager, NP Lesley Maffucci, PA-C Bernardino Bring, PA-C Cadence [place], PA-C Tylene Lunch, NP Barnie Hila, NP    We recommend signing up for the patient portal called MyChart.  Sign up information is provided on this After Visit Summary.  MyChart is used to connect with patients for Virtual Visits (Telemedicine).  Patients are able to view lab/test  results, encounter notes, upcoming appointments, etc.  Non-urgent messages can be sent to your provider as well.   To learn more about what you can do with MyChart, go to [email or web address].

## 2024-02-25 NOTE — Progress Notes (Signed)
 Daily Session Note  Patient Details  Name: Amber Holder MRN: [id number] Date of Birth: [birth year] Referring Provider:   Flowsheet Row Pulmonary Rehab from 01/17/2024 in [facility] Cardiac and Pulmonary Rehab  Referring Provider Dr. Elfreda Bathe, MD    Encounter Date: 02/25/2024  Check In:  Session Check In - 02/25/24 1121       Check-In   Supervising physician immediately available to respond to emergencies See telemetry face sheet for immediately available ER MD    Location [facility]-Cardiac & Pulmonary Rehab    Staff Present Hoy Rodney RN,BSN;Elysia Grand RN,BSN,MPA;Maxon Conetta BS, Exercise Physiologist;Joseph Rolinda RCP,RRT,BSRT    Virtual Visit No    Medication changes reported     No    Fall or balance concerns reported    No    Tobacco Cessation No Change    Warm-up and Cool-down Performed on first and last piece of equipment    Resistance Training Performed Yes    VAD Patient? No    PAD/SET Patient? No      Pain Assessment   Currently in Pain? No/denies             Social History   Tobacco Use  Smoking Status Former   Current packs/day: 0.00   Average packs/day: 1 pack/day for 50.0 years (50.0 ttl pk-yrs)   Types: Cigarettes   Start date: 12/29/1971   Quit date: 12/28/2021   Years since quitting: 2.1   Passive exposure: Never  Smokeless Tobacco Never    Goals Met:  Independence with exercise equipment Exercise tolerated well No report of concerns or symptoms today Strength training completed today  Goals Unmet:  Not Applicable  Comments: Pt able to follow exercise prescription today without complaint.  Will continue to monitor for progression.    Dr. Oneil Pinal is Medical Director for [facility] Cardiac Rehabilitation.  Dr. Fuad Aleskerov is Medical Director for [facility] Pulmonary Rehabilitation.

## 2024-02-25 NOTE — Progress Notes (Signed)
 Cardiology Office Note:    Date:  02/25/2024   ID:  Amber Holder, DOB 05-Dec-1945, MRN [id number]  PCP:  Kristina Tinnie POUR, PA-C   [facility] Providers Cardiologist:  Redell Cave, MD     Referring MD: Kristina Tinnie POUR, PA*   Chief Complaint  Patient presents with   Follow-up    6 week  follow up pat has been doing well with no complaints of chest pain, chest pressure or SOB, medciation reviewed verbally with patient    History of Present Illness:    Amber Holder is a 78 y.o. female with a hx of CAD (CTO RCA mild to moderate LCx and LAD dz), hyperlipidemia, diabetes, COPD, former smoker x40+ years, PAD/carotid stenosis, IBS, hard of hearing who presents for follow-up.    Had symptoms of myalgias on Lipitor.  Lipitor was held x 2 weeks with improvement in her symptoms.  Leg edema adequately controlled on current dose of torsemide .  No new concerns at this time.  Compliant with medications as prescribed.  Prior notes CMR EF 58%, subendocardial scar comprising 75% wall thickness in the LV inferior wall, inferior wall nonfriable. Coronary CTA 10/2021 calcium  score 790, CTO proximal RCA, nonobstructive LAD and left circumflex disease Echo 10/2021 EF 60 to 65%, impaired laxation. Did not tolerate higher doses of Lipitor, currently on 20 mg daily.  Outside echocardiogram 2021 showed normal systolic function, diastolic dysfunction EF 60% Chest CT 06/2016 showed coronary artery calcification  Past Medical History:  Diagnosis Date   Anxiety 01/02/2012   Arthritis    Asthma    Blockage of coronary artery of heart (HCC)    Chronic kidney disease    Kidney stones   Colon polyps    COPD (chronic obstructive pulmonary disease) (HCC)    Diabetes mellitus without complication (HCC)    GERD (gastroesophageal reflux disease)    HOH (hard of hearing)    Bilateral hearing aids   Kidney stone    Osteoporosis    Sleep apnea    Does not use C-PAP on a regular basis   Urinary  tract infection    Venous stasis     Past Surgical History:  Procedure Laterality Date   APPENDECTOMY     cataract surgery Bilateral    CHOLECYSTECTOMY     COLONOSCOPY     COLONOSCOPY WITH PROPOFOL  N/A 05/28/2015   Procedure: COLONOSCOPY WITH PROPOFOL ;  Surgeon: Gladis RAYMOND Mariner, MD;  Location: [facility] ENDOSCOPY;  Service: Endoscopy;  Laterality: N/A;   COLONOSCOPY WITH PROPOFOL  N/A 10/28/2018   Procedure: COLONOSCOPY WITH PROPOFOL ;  Surgeon: Mariner Gladis RAYMOND, MD;  Location: [facility] ENDOSCOPY;  Service: Endoscopy;  Laterality: N/A;   EYE SURGERY Bilateral 2015   Cataract Extraction with IOL   LITHOTRIPSY  2015   has had many stones   Mastoidotomy  1970   ROTATOR CUFF REPAIR Right 2011   TOTAL HIP ARTHROPLASTY Right 06/14/2016   Procedure: TOTAL HIP ARTHROPLASTY;  Surgeon: Lynwood SHAUNNA Hue, MD;  Location: [facility] ORS;  Service: Orthopedics;  Laterality: Right;    Current Medications: Current Meds  Medication Sig   acetaminophen  (TYLENOL ) 500 MG chewable tablet Chew 1,000 mg by mouth every 8 (eight) hours as needed for pain.   albuterol  (VENTOLIN  HFA) 108 (90 Base) MCG/ACT inhaler Inhale 2 puffs into the lungs every 6 (six) hours as needed for wheezing or shortness of breath.   aspirin  EC 81 MG tablet Take 1 tablet (81 mg total) by mouth daily. Swallow whole.   cholecalciferol  (  VITAMIN D ) 400 UNITS TABS tablet Take 2,000 Units by mouth daily.   CRANBERRY PO Take 25,000 mg by mouth daily.   diclofenac  Sodium (VOLTAREN ) 1 % GEL Apply small amount to area of pain daily as needed   ergocalciferol  (DRISDOL ) 1.25 MG (50000 UT) capsule Take one cap q week   estradiol  (ESTRACE ) 0.1 MG/GM vaginal cream Estrogen Cream Instruction Discard applicator Apply pea sized amount to tip of finger to urethra before bed. Wash hands well after application. Use Monday, Wednesday and Friday   ezetimibe  (ZETIA ) 10 MG tablet Take 1 tablet (10 mg total) by mouth daily.   FARXIGA  10 MG TABS tablet TAKE 1 TABLET BY MOUTH  DAILY BEFORE BREAKFAST.   fluconazole  (DIFLUCAN ) 150 MG tablet Take one tab po q week for fungal infection and as needed   Fluticasone -Umeclidin-Vilant (TRELEGY ELLIPTA ) 100-62.5-25 MCG/ACT AEPB Use inhale  orally daily   glucose blood (ONETOUCH VERIO) test strip BLOOD SUGAR TESTING ONCE DAILY . DX E11.65   hydrocortisone  cream 0.5 % Apply topically as needed.   ibandronate  (BONIVA ) 150 MG tablet TAKE 1 TABLET (150 MG TOTAL) BY MOUTH EVERY 30 (THIRTY) DAYS.   ketoconazole (NIZORAL) 2 % cream Apply 1 Application topically daily.   Lancets (ONETOUCH DELICA PLUS LANCET30G) MISC Use  as directed twice a daily DX E11.65   macitentan  (OPSUMIT ) 10 MG tablet Take 1 tablet (10 mg total) by mouth daily.   Magnesium  250 MG TABS Take by mouth. Takes 1 tablet 2-3 times per week   montelukast  (SINGULAIR ) 10 MG tablet Take 1 tablet (10 mg total) by mouth daily as needed.   Multiple Vitamins-Minerals (MULTIVITAMIN ADULTS PO) Take 1 tablet by mouth daily.   omeprazole  (PRILOSEC) 40 MG capsule TAKE 1 CAPSULE (40 MG TOTAL) BY MOUTH DAILY.   OXYGEN  Inhale 3 L into the lungs. Pt uses [facility] for Oxygen    potassium chloride  SA (KLOR-CON  M) 20 MEQ tablet Take 2 tablets (40 mEq total) by mouth daily.   spironolactone  (ALDACTONE ) 25 MG tablet Take 1 tablet (25 mg total) by mouth daily.   Torsemide  40 MG TABS Take 40 mg by mouth daily.   [DISCONTINUED] atorvastatin  (LIPITOR) 40 MG tablet Take 1 tablet (40 mg total) by mouth daily.   Current Facility-Administered Medications for the 02/25/24 encounter (Office Visit) with Darliss Rogue, MD  Medication   nitrofurantoin  (macrocrystal-monohydrate) (MACROBID ) capsule 100 mg     Allergies:   Iodinated contrast media, Shellfish allergy , Sulfa antibiotics, Sulfasalazine, Percocet [oxycodone-acetaminophen ], and Iodine   Social History   Socioeconomic History   Marital status: Divorced    Spouse name: Not on file   Number of children: Not on file   Years of  education: Not on file   Highest education level: Not on file  Occupational History   Not on file  Tobacco Use   Smoking status: Former    Current packs/day: 0.00    Average packs/day: 1 pack/day for 50.0 years (50.0 ttl pk-yrs)    Types: Cigarettes    Start date: 12/29/1971    Quit date: 12/28/2021    Years since quitting: 2.1    Passive exposure: Never   Smokeless tobacco: Never  Vaping Use   Vaping status: Never Used  Substance and Sexual Activity   Alcohol  use: Not Currently    Comment: very rarely    Drug use: No   Sexual activity: Not Currently    Birth control/protection: Post-menopausal  Other Topics Concern   Not on file  Social History Narrative   Not on file   Social Drivers of Health   Financial Resource Strain: Not on file  Food Insecurity: Not on file  Transportation Needs: Not on file  Physical Activity: Insufficiently Active (09/21/2022)   Exercise Vital Sign    Days of Exercise per Week: 3 days    Minutes of Exercise per Session: 30 min  Stress: Not on file  Social Connections: Not on file     Family History: The patient's family history includes Breast cancer in her maternal aunt; Breast cancer (age of onset: 78) in her maternal aunt. There is no history of Bladder Cancer or Kidney cancer.  ROS:   Please see the history of present illness.     All other systems reviewed and are negative.  EKGs/Labs/Other Studies Reviewed:    The following studies were reviewed today:   EKG Interpretation Date/Time:  Monday February 25 2024 10:14:29 EDT Ventricular Rate:  61 PR Interval:  202 QRS Duration:  108 QT Interval:  440 QTC Calculation: 442 R Axis:   22  Text Interpretation: Normal sinus rhythm Septal infarct , age undetermined Confirmed by Darliss Rogue ([id number]) on 02/25/2024 10:58:12 AM    Recent Labs: 06/11/2023: TSH 1.797 01/11/2024: ALT 23; BUN 23; Creatinine, Ser 0.82; Hemoglobin 14.1; Platelets 220; Potassium 4.0; Sodium 135  Recent Lipid  Panel    Component Value Date/Time   CHOL 169 06/11/2023 1032   CHOL 239 (H) 12/24/2020 1154   TRIG 79 06/11/2023 1032   HDL 57 06/11/2023 1032   HDL 55 12/24/2020 1154   CHOLHDL 3.0 06/11/2023 1032   VLDL 16 06/11/2023 1032   LDLCALC 96 06/11/2023 1032   LDLCALC 168 (H) 12/24/2020 1154   LDLDIRECT 100 (H) 06/11/2023 1032     Risk Assessment/Calculations:          Physical Exam:    VS:  BP 110/60 (BP Location: Left Arm, Patient Position: Sitting, Cuff Size: Normal)   Pulse 61   Ht 5' 5 (1.651 m)   Wt 189 lb 9.6 oz (86 kg)   SpO2 (!) 88%   BMI 31.55 kg/m     Wt Readings from Last 3 Encounters:  02/25/24 189 lb 9.6 oz (86 kg)  01/17/24 188 lb 14.4 oz (85.7 kg)  01/14/24 190 lb 6.4 oz (86.4 kg)     GEN:  Well nourished, well developed in no acute distress HEENT: Normal NECK: No JVD; No carotid bruits CARDIAC: RRR, no murmurs, rubs, gallops RESPIRATORY: Decreased breath sounds, no wheezing. ABDOMEN: Soft, non-tender, non-distended MUSCULOSKELETAL:  1+ edema. SKIN: Warm and dry NEUROLOGIC:  Alert and oriented x 3 PSYCHIATRIC:  Normal affect   ASSESSMENT:    1. Coronary artery disease involving native coronary artery of native heart, unspecified whether angina present   2. Pure hypercholesterolemia   3. Leg edema   4. Muscle cramps     PLAN:    In order of problems listed above:  CAD, CTO RCA.  Calcium  score 790.  CMR 10/23 showed inferior LV wall nonviable.  EF 58%.  Denies chest pain.  Continue aspirin  81 mg daily  Echo EF 60 to 65%, impaired relaxation.  No plans for left heart cath, as patient has CTO, patient also has iodine allergies. Hyperlipidemia, Lipitor caused cramps.  Start Zetia  10 mg daily, start Lipitor.  Obtain lipid panel in 6 months. Leg edema bilaterally, controlled with increased dose of torsemide .  Continue torsemide  40 mg daily, Aldactone  25 mg daily.  KCl  40 mEq daily.  Leg cramping.  Improved with holding Lipitor.  Stop Lipitor, start  Zetia  10 mg daily.  Follow-up in 6 months  Medication Adjustments/Labs and Tests Ordered: Current medicines are reviewed at length with the patient today.  Concerns regarding medicines are outlined above.  Orders Placed This Encounter  Procedures   Lipid panel   EKG 12-Lead   Meds ordered this encounter  Medications   ezetimibe  (ZETIA ) 10 MG tablet    Sig: Take 1 tablet (10 mg total) by mouth daily.    Dispense:  90 tablet    Refill:  3    Patient Instructions  Medication Instructions:  Your physician recommends the following medication changes.  STOP TAKING: Lipitor   START TAKING: Zetia  10 mg daily  *If you need a refill on your cardiac medications before your next appointment, please call your pharmacy*  Lab Work: Your provider would like for you to return in 6 months to have the following labs drawn: fasting lipid panel.  Please go to [facility] [address] ([facility]) [address], [place] [address] You do not need an appointment.  They are open from 8 am- 4:30 pm.  Lunch from 1:00 pm- 2:00 pm You do need to be fasting.  If you have labs (blood work) drawn today and your tests are completely normal, you will receive your results only by: MyChart Message (if you have MyChart) OR A paper copy in the mail If you have any lab test that is abnormal or we need to change your treatment, we will call you to review the results.  Testing/Procedures: No test ordered today   Follow-Up: At [facility], you and your health needs are our priority.  As part of our continuing mission to provide you with exceptional heart care, our providers are all part of one team.  This team includes your primary Cardiologist (physician) and Advanced Practice Providers or APPs (Physician Assistants and Nurse Practitioners) who all work together to provide you with the care you need, when you need it.  Your next appointment:   6 month(s)  Provider:   You may  see Redell Cave, MD or one of the following Advanced Practice Providers on your designated Care Team:   Lonni Meager, NP Lesley Maffucci, PA-C Bernardino Bring, PA-C Cadence [place], PA-C Tylene Lunch, NP Barnie Hila, NP    We recommend signing up for the patient portal called MyChart.  Sign up information is provided on this After Visit Summary.  MyChart is used to connect with patients for Virtual Visits (Telemedicine).  Patients are able to view lab/test results, encounter notes, upcoming appointments, etc.  Non-urgent messages can be sent to your provider as well.   To learn more about what you can do with MyChart, go to [email or web address].     Signed, Redell Cave, MD  02/25/2024 12:13 PM    [facility]

## 2024-02-27 ENCOUNTER — Encounter: Attending: Internal Medicine

## 2024-02-27 ENCOUNTER — Encounter: Payer: Self-pay | Admitting: *Deleted

## 2024-02-27 DIAGNOSIS — I2721 Secondary pulmonary arterial hypertension: Secondary | ICD-10-CM | POA: Insufficient documentation

## 2024-02-27 NOTE — Progress Notes (Signed)
 Pulmonary Individual Treatment Plan  Patient Details  Name: Carissa Musick MRN: [id number] Date of Birth: August 27, 1946 Referring Provider:   Flowsheet Row Pulmonary Rehab from 01/17/2024 in [facility] Cardiac and Pulmonary Rehab  Referring Provider Dr. Elfreda Bathe, MD    Initial Encounter Date:  Flowsheet Row Pulmonary Rehab from 01/17/2024 in [facility] Cardiac and Pulmonary Rehab  Date 01/17/24    Visit Diagnosis: Pulmonary artery hypertension (HCC)  Patient's Home Medications on Admission:  Current Outpatient Medications:    acetaminophen  (TYLENOL ) 500 MG chewable tablet, Chew 1,000 mg by mouth every 8 (eight) hours as needed for pain., Disp: , Rfl:    albuterol  (VENTOLIN  HFA) 108 (90 Base) MCG/ACT inhaler, Inhale 2 puffs into the lungs every 6 (six) hours as needed for wheezing or shortness of breath., Disp: 18 g, Rfl: 2   aspirin  EC 81 MG tablet, Take 1 tablet (81 mg total) by mouth daily. Swallow whole., Disp: 90 tablet, Rfl: 3   cholecalciferol  (VITAMIN D ) 400 UNITS TABS tablet, Take 2,000 Units by mouth daily., Disp: , Rfl:    CRANBERRY PO, Take 25,000 mg by mouth daily., Disp: , Rfl:    diclofenac  Sodium (VOLTAREN ) 1 % GEL, Apply small amount to area of pain daily as needed, Disp: 2 g, Rfl: 0   ergocalciferol  (DRISDOL ) 1.25 MG (50000 UT) capsule, Take one cap q week, Disp: 12 capsule, Rfl: 3   estradiol  (ESTRACE ) 0.1 MG/GM vaginal cream, Estrogen Cream Instruction Discard applicator Apply pea sized amount to tip of finger to urethra before bed. Wash hands well after application. Use Monday, Wednesday and Friday, Disp: 42.5 g, Rfl: 12   ezetimibe  (ZETIA ) 10 MG tablet, Take 1 tablet (10 mg total) by mouth daily., Disp: 90 tablet, Rfl: 3   FARXIGA  10 MG TABS tablet, TAKE 1 TABLET BY MOUTH DAILY BEFORE BREAKFAST., Disp: 90 tablet, Rfl: 3   fluconazole  (DIFLUCAN ) 150 MG tablet, Take one tab po q week for fungal infection and as needed, Disp: 12 tablet, Rfl: 1   Fluticasone -Umeclidin-Vilant (TRELEGY  ELLIPTA) 100-62.5-25 MCG/ACT AEPB, Use inhale  orally daily, Disp: 3 each, Rfl: 4   glucose blood (ONETOUCH VERIO) test strip, BLOOD SUGAR TESTING ONCE DAILY . DX E11.65, Disp: 100 strip, Rfl: 3   hydrocortisone  cream 0.5 %, Apply topically as needed., Disp: 30 g, Rfl: 0   ibandronate  (BONIVA ) 150 MG tablet, TAKE 1 TABLET (150 MG TOTAL) BY MOUTH EVERY 30 (THIRTY) DAYS., Disp: 3 tablet, Rfl: 3   ketoconazole (NIZORAL) 2 % cream, Apply 1 Application topically daily., Disp: , Rfl:    Lancets (ONETOUCH DELICA PLUS LANCET30G) MISC, Use  as directed twice a daily DX E11.65, Disp: 100 each, Rfl: 3   macitentan  (OPSUMIT ) 10 MG tablet, Take 1 tablet (10 mg total) by mouth daily., Disp: 90 tablet, Rfl: 5   Magnesium  250 MG TABS, Take by mouth. Takes 1 tablet 2-3 times per week, Disp: , Rfl:    montelukast  (SINGULAIR ) 10 MG tablet, Take 1 tablet (10 mg total) by mouth daily as needed., Disp: 90 tablet, Rfl: 1   Multiple Vitamins-Minerals (MULTIVITAMIN ADULTS PO), Take 1 tablet by mouth daily., Disp: , Rfl:    omeprazole  (PRILOSEC) 40 MG capsule, TAKE 1 CAPSULE (40 MG TOTAL) BY MOUTH DAILY., Disp: 90 capsule, Rfl: 3   OXYGEN , Inhale 3 L into the lungs. Pt uses [facility] for Oxygen , Disp: , Rfl:    potassium chloride  SA (KLOR-CON  M) 20 MEQ tablet, Take 2 tablets (40 mEq total) by mouth  daily., Disp: 180 tablet, Rfl: 3   spironolactone  (ALDACTONE ) 25 MG tablet, Take 1 tablet (25 mg total) by mouth daily., Disp: 90 tablet, Rfl: 3   Torsemide  40 MG TABS, Take 40 mg by mouth daily., Disp: 90 tablet, Rfl: 3  Current Facility-Administered Medications:    nitrofurantoin  (macrocrystal-monohydrate) (MACROBID ) capsule 100 mg, 100 mg, Oral, Q12H, McDonough, Tinnie POUR, PA-C  Past Medical History: Past Medical History:  Diagnosis Date   Anxiety 01/02/2012   Arthritis    Asthma    Blockage of coronary artery of heart (HCC)    Chronic kidney disease    Kidney stones   Colon polyps    COPD (chronic obstructive  pulmonary disease) (HCC)    Diabetes mellitus without complication (HCC)    GERD (gastroesophageal reflux disease)    HOH (hard of hearing)    Bilateral hearing aids   Kidney stone    Osteoporosis    Sleep apnea    Does not use C-PAP on a regular basis   Urinary tract infection    Venous stasis     Tobacco Use: Social History   Tobacco Use  Smoking Status Former   Current packs/day: 0.00   Average packs/day: 1 pack/day for 50.0 years (50.0 ttl pk-yrs)   Types: Cigarettes   Start date: 12/29/1971   Quit date: 12/28/2021   Years since quitting: 2.1   Passive exposure: Never  Smokeless Tobacco Never    Labs: Review Flowsheet  More data exists      Latest Ref Rng & Units 05/19/2022 07/07/2022 09/05/2022 06/11/2023 11/01/2023  Labs for ITP Cardiac and Pulmonary Rehab  Cholestrol 0 - 200 mg/dL 837  827  - 830  -  LDL (calc) 0 - 99 mg/dL 92  893  - 96  -  Direct LDL 0 - 99 mg/dL - - - 899  -  HDL-C >59 mg/dL 48  45  - 57  -  Trlycerides <150 mg/dL 888  895  - 79  -  Hemoglobin A1c 4.0 - 5.6 % - - 6.3  6.6  6.4      Pulmonary Assessment Scores:  Pulmonary Assessment Scores     Row Name 01/17/24 1149         ADL UCSD   ADL Phase Entry     SOB Score total 64     Rest 1     Walk 3     Stairs 4     Bath 3     Dress 2     Shop 3       CAT Score   CAT Score 14       mMRC Score   mMRC Score 2        UCSD: Self-administered rating of dyspnea associated with activities of daily living (ADLs) 6-point scale (0 = not at all to 5 = maximal or unable to do because of breathlessness)  Scoring Scores range from 0 to 120.  Minimally important difference is 5 units  CAT: CAT can identify the health impairment of COPD patients and is better correlated with disease progression.  CAT has a scoring range of zero to 40. The CAT score is classified into four groups of low (less than 10), medium (10 - 20), high (21-30) and very high (31-40) based on the impact level of disease on  health status. A CAT score over 10 suggests significant symptoms.  A worsening CAT score could be explained by an exacerbation, poor medication adherence, poor  inhaler technique, or progression of COPD or comorbid conditions.  CAT MCID is 2 points  mMRC: mMRC (Modified Medical Research Council) Dyspnea Scale is used to assess the degree of baseline functional disability in patients of respiratory disease due to dyspnea. No minimal important difference is established. A decrease in score of 1 point or greater is considered a positive change.   Pulmonary Function Assessment:   Exercise Target Goals: Exercise Program Goal: Individual exercise prescription set using results from initial 6 min walk test and THRR while considering  patient's activity barriers and safety.   Exercise Prescription Goal: Initial exercise prescription builds to 30-45 minutes a day of aerobic activity, 2-3 days per week.  Home exercise guidelines will be given to patient during program as part of exercise prescription that the participant will acknowledge.  Education: Aerobic Exercise: - Group verbal and visual presentation on the components of exercise prescription. Introduces F.I.T.T principle from ACSM for exercise prescriptions.  Reviews F.I.T.T. principles of aerobic exercise including progression. Written material given at graduation. Flowsheet Row Pulmonary Rehab from 02/27/2024 in [facility] Cardiac and Pulmonary Rehab  Date 01/23/24  Educator NT  Instruction Review Code 1- Verbalizes Understanding    Education: Resistance Exercise: - Group verbal and visual presentation on the components of exercise prescription. Introduces F.I.T.T principle from ACSM for exercise prescriptions  Reviews F.I.T.T. principles of resistance exercise including progression. Written material given at graduation.    Education: Exercise & Equipment Safety: - Individual verbal instruction and demonstration of equipment use and safety with  use of the equipment. Flowsheet Row Pulmonary Rehab from 02/27/2024 in [facility] Cardiac and Pulmonary Rehab  Date 01/17/24  Educator NT  Instruction Review Code 1- Verbalizes Understanding    Education: Exercise Physiology & General Exercise Guidelines: - Group verbal and written instruction with models to review the exercise physiology of the cardiovascular system and associated critical values. Provides general exercise guidelines with specific guidelines to those with heart or lung disease.  Flowsheet Row Pulmonary Rehab from 08/23/2022 in [facility] Cardiac and Pulmonary Rehab  Date 06/28/22  Educator [facility]  Instruction Review Code 1- Verbalizes Understanding    Education: Flexibility, Balance, Mind/Body Relaxation: - Group verbal and visual presentation with interactive activity on the components of exercise prescription. Introduces F.I.T.T principle from ACSM for exercise prescriptions. Reviews F.I.T.T. principles of flexibility and balance exercise training including progression. Also discusses the mind body connection.  Reviews various relaxation techniques to help reduce and manage stress (i.e. Deep breathing, progressive muscle relaxation, and visualization). Balance handout provided to take home. Written material given at graduation.   Activity Barriers & Risk Stratification:  Activity Barriers & Cardiac Risk Stratification - 01/17/24 1150       Activity Barriers & Cardiac Risk Stratification   Activity Barriers Right Hip Replacement;Shortness of Breath;Muscular Weakness;Balance Concerns;Arthritis;Back Problems          6 Minute Walk:  6 Minute Walk     Row Name 01/17/24 1151         6 Minute Walk   Phase Initial     Distance 1050 feet     Walk Time 6 minutes     # of Rest Breaks 0     MPH 1.99     METS 1.58     RPE 13     Perceived Dyspnea  2     VO2 Peak 5.51     Symptoms Yes (comment)     Comments R leg weakness     Resting HR 62  bpm     Resting BP 132/64     Resting  Oxygen  Saturation  93 %     Exercise Oxygen  Saturation  during 6 min walk 87 %     Max Ex. HR 84 bpm     Max Ex. BP 132/64     2 Minute Post BP 114/58       Interval HR   1 Minute HR 76     2 Minute HR 80     3 Minute HR 81     4 Minute HR 81     5 Minute HR 84     6 Minute HR 83     2 Minute Post HR 63     Interval Heart Rate? Yes       Interval Oxygen    Interval Oxygen ? Yes     Baseline Oxygen  Saturation % 93 %     1 Minute Oxygen  Saturation % 91 %     1 Minute Liters of Oxygen  2 L     2 Minute Oxygen  Saturation % 88 %     2 Minute Liters of Oxygen  2 L     3 Minute Oxygen  Saturation % 87 %     3 Minute Liters of Oxygen  2 L     4 Minute Oxygen  Saturation % 87 %     4 Minute Liters of Oxygen  2 L     5 Minute Oxygen  Saturation % 87 %     5 Minute Liters of Oxygen  2 L     6 Minute Oxygen  Saturation % 87 %     6 Minute Liters of Oxygen  2 L     2 Minute Post Oxygen  Saturation % 94 %     2 Minute Post Liters of Oxygen  2 L       Oxygen  Initial Assessment:  Oxygen  Initial Assessment - 01/15/24 1302       Home Oxygen    Home Oxygen  Device Home Concentrator;E-Tanks;Portable Concentrator    Sleep Oxygen  Prescription Continuous    Liters per minute 2    Home Exercise Oxygen  Prescription Continuous    Liters per minute 3   2-3   Home Resting Oxygen  Prescription Continuous    Liters per minute 2.5    Compliance with Home Oxygen  Use Yes          Oxygen  Re-Evaluation:  Oxygen  Re-Evaluation     Row Name 01/23/24 1134             Program Oxygen  Prescription   Program Oxygen  Prescription Continuous       Liters per minute 2         Home Oxygen    Home Oxygen  Device Home Concentrator;E-Tanks;Portable Concentrator       Sleep Oxygen  Prescription Continuous       Liters per minute 2       Home Exercise Oxygen  Prescription Continuous       Liters per minute 3       Home Resting Oxygen  Prescription Continuous       Liters per minute 2.5       Compliance with Home  Oxygen  Use Yes         Goals/Expected Outcomes   Short Term Goals To learn and demonstrate proper pursed lip breathing techniques or other breathing techniques.        Long  Term Goals Exhibits proper breathing techniques, such as pursed lip breathing or other method taught during program session  Comments Reviewed PLB technique with pt.  Talked about how it works and it's importance in maintaining their exercise saturations.       Goals/Expected Outcomes Short: Become more profiecient at using PLB. Long: Become independent at using PLB.          Oxygen  Discharge (Final Oxygen  Re-Evaluation):  Oxygen  Re-Evaluation - 01/23/24 1134       Program Oxygen  Prescription   Program Oxygen  Prescription Continuous    Liters per minute 2      Home Oxygen    Home Oxygen  Device Home Concentrator;E-Tanks;Portable Concentrator    Sleep Oxygen  Prescription Continuous    Liters per minute 2    Home Exercise Oxygen  Prescription Continuous    Liters per minute 3    Home Resting Oxygen  Prescription Continuous    Liters per minute 2.5    Compliance with Home Oxygen  Use Yes      Goals/Expected Outcomes   Short Term Goals To learn and demonstrate proper pursed lip breathing techniques or other breathing techniques.     Long  Term Goals Exhibits proper breathing techniques, such as pursed lip breathing or other method taught during program session    Comments Reviewed PLB technique with pt.  Talked about how it works and it's importance in maintaining their exercise saturations.    Goals/Expected Outcomes Short: Become more profiecient at using PLB. Long: Become independent at using PLB.          Initial Exercise Prescription:  Initial Exercise Prescription - 01/17/24 1100       Date of Initial Exercise RX and Referring Provider   Date 01/17/24    Referring Provider Dr. Elfreda Bathe, MD      Oxygen    Oxygen  Continuous    Liters 2-3    Maintain Oxygen  Saturation 88% or higher       Treadmill   MPH 1.8    Grade 0    Minutes 15    METs 2.38      Recumbant Bike   Level 1    RPM 50    Watts 15    Minutes 15    METs 1.58      NuStep   Level 2    SPM 80    Minutes 15    METs 1.58      Prescription Details   Frequency (times per week) 2    Duration Progress to 30 minutes of continuous aerobic without signs/symptoms of physical distress      Intensity   THRR 40-80% of Max Heartrate 94-126    Ratings of Perceived Exertion 11-13    Perceived Dyspnea 0-4      Progression   Progression Continue to progress workloads to maintain intensity without signs/symptoms of physical distress.      Resistance Training   Training Prescription Yes    Weight 3 lb    Reps 10-15          Perform Capillary Blood Glucose checks as needed.  Exercise Prescription Changes:   Exercise Prescription Changes     Row Name 01/17/24 1100 01/30/24 1500 02/13/24 1500         Response to Exercise   Blood Pressure (Admit) 120/60 108/58 108/62     Blood Pressure (Exercise) 132/64 122/64 132/58     Blood Pressure (Exit) 114/58 116/58 104/56     Heart Rate (Admit) 62 bpm 73 bpm 68 bpm     Heart Rate (Exercise) 84 bpm 80 bpm 85 bpm  Heart Rate (Exit) 63 bpm 76 bpm 69 bpm     Oxygen  Saturation (Admit) 93 % 92 % 93 %     Oxygen  Saturation (Exercise) 87 % 88 % 88 %     Oxygen  Saturation (Exit) 94 % 89 % 91 %     Rating of Perceived Exertion (Exercise) 13 13 15      Perceived Dyspnea (Exercise) 2 -- 4     Symptoms R leg weakness none none     Comments Results first 2 weeks of exercise --     Duration -- Progress to 30 minutes of  aerobic without signs/symptoms of physical distress Progress to 30 minutes of  aerobic without signs/symptoms of physical distress     Intensity -- THRR unchanged THRR unchanged       Progression   Progression -- Continue to progress workloads to maintain intensity without signs/symptoms of physical distress. Continue to progress workloads to  maintain intensity without signs/symptoms of physical distress.     Average METs -- 2.44 2.4       Resistance Training   Training Prescription -- Yes Yes     Weight -- 3 lb 3 lb     Reps -- 10-15 10-15       Interval Training   Interval Training -- No No       Oxygen    Oxygen  -- Continuous Continuous     Liters -- 2-3 2-3       Treadmill   MPH -- 1.8 1.8     Grade -- 0 0     Minutes -- 15 15     METs -- 2.38 2.38       Recumbant Bike   Level -- -- 1.1     Watts -- -- 15     Minutes -- -- 15     METs -- -- 2.55       NuStep   Level -- 2 3     Minutes -- 15 15     METs -- 2.5 2.6       Oxygen    Maintain Oxygen  Saturation -- 88% or higher 88% or higher        Exercise Comments:   Exercise Comments     Row Name 01/23/24 1133           Exercise Comments First full day of exercise!  Patient was oriented to gym and equipment including functions, settings, policies, and procedures.  Patient's individual exercise prescription and treatment plan were reviewed.  All starting workloads were established based on the results of the 6 minute walk test done at initial orientation visit.  The plan for exercise progression was also introduced and progression will be customized based on patient's performance and goals.          Exercise Goals and Review:   Exercise Goals     Row Name 01/17/24 1150             Exercise Goals   Increase Physical Activity Yes       Intervention Provide advice, education, support and counseling about physical activity/exercise needs.;Develop an individualized exercise prescription for aerobic and resistive training based on initial evaluation findings, risk stratification, comorbidities and participant's personal goals.       Expected Outcomes Short Term: Attend rehab on a regular basis to increase amount of physical activity.;Long Term: Add in home exercise to make exercise part of routine and to increase amount of physical activity.;Long  Term: Exercising regularly  at least 3-5 days a week.       Increase Strength and Stamina Yes       Intervention Provide advice, education, support and counseling about physical activity/exercise needs.;Develop an individualized exercise prescription for aerobic and resistive training based on initial evaluation findings, risk stratification, comorbidities and participant's personal goals.       Expected Outcomes Short Term: Increase workloads from initial exercise prescription for resistance, speed, and METs.;Short Term: Perform resistance training exercises routinely during rehab and add in resistance training at home;Long Term: Improve cardiorespiratory fitness, muscular endurance and strength as measured by increased METs and functional capacity ( )       Able to understand and use rate of perceived exertion (RPE) scale Yes       Intervention Provide education and explanation on how to use RPE scale       Expected Outcomes Short Term: Able to use RPE daily in rehab to express subjective intensity level;Long Term:  Able to use RPE to guide intensity level when exercising independently       Able to understand and use Dyspnea scale Yes       Intervention Provide education and explanation on how to use Dyspnea scale       Expected Outcomes Short Term: Able to use Dyspnea scale daily in rehab to express subjective sense of shortness of breath during exertion;Long Term: Able to use Dyspnea scale to guide intensity level when exercising independently       Knowledge and understanding of Target Heart Rate Range (THRR) Yes       Intervention Provide education and explanation of THRR including how the numbers were predicted and where they are located for reference       Expected Outcomes Short Term: Able to state/look up THRR;Long Term: Able to use THRR to govern intensity when exercising independently;Short Term: Able to use daily as guideline for intensity in rehab       Able to check pulse independently  Yes       Intervention Provide education and demonstration on how to check pulse in carotid and radial arteries.;Review the importance of being able to check your own pulse for safety during independent exercise       Expected Outcomes Short Term: Able to explain why pulse checking is important during independent exercise;Long Term: Able to check pulse independently and accurately       Understanding of Exercise Prescription Yes       Intervention Provide education, explanation, and written materials on patient's individual exercise prescription       Expected Outcomes Short Term: Able to explain program exercise prescription;Long Term: Able to explain home exercise prescription to exercise independently          Exercise Goals Re-Evaluation :  Exercise Goals Re-Evaluation     Row Name 01/23/24 1135 01/30/24 1556 02/13/24 1518         Exercise Goal Re-Evaluation   Exercise Goals Review Able to understand and use rate of perceived exertion (RPE) scale;Able to understand and use Dyspnea scale;Knowledge and understanding of Target Heart Rate Range (THRR);Understanding of Exercise Prescription Increase Physical Activity;Increase Strength and Stamina;Understanding of Exercise Prescription Increase Physical Activity;Increase Strength and Stamina;Understanding of Exercise Prescription     Comments Reviewed RPE and dyspnea scale, THR and program prescription with pt today.  Pt voiced understanding and was given a copy of goals to take home. Maat is off to a good start in the program. She was able to complete her  first session during this review period. During her session she used the treadmill at a speed of 1.56mph and no incline, and the T4 nustep at level 2. We will continue to monitor her progress in the program. Ottilia is doing well in rehab. She has recently been able to increase from level 2 to 3 on the T4 nustep. She was also able to increase her speed on the treadmill to 1.18mph. We will contiue to  monitor her progress in the program.     Expected Outcomes Short: Use RPE daily to regulate intensity. Long: Follow program prescription in THR. Short: Continue to follow exercise prescription. Long: Continue exercise to improve strength and stamina. Short: Continue to follow exercise prescription. Long: Continue exercise to improve strength and stamina.        Discharge Exercise Prescription (Final Exercise Prescription Changes):  Exercise Prescription Changes - 02/13/24 1500       Response to Exercise   Blood Pressure (Admit) 108/62    Blood Pressure (Exercise) 132/58    Blood Pressure (Exit) 104/56    Heart Rate (Admit) 68 bpm    Heart Rate (Exercise) 85 bpm    Heart Rate (Exit) 69 bpm    Oxygen  Saturation (Admit) 93 %    Oxygen  Saturation (Exercise) 88 %    Oxygen  Saturation (Exit) 91 %    Rating of Perceived Exertion (Exercise) 15    Perceived Dyspnea (Exercise) 4    Symptoms none    Duration Progress to 30 minutes of  aerobic without signs/symptoms of physical distress    Intensity THRR unchanged      Progression   Progression Continue to progress workloads to maintain intensity without signs/symptoms of physical distress.    Average METs 2.4      Resistance Training   Training Prescription Yes    Weight 3 lb    Reps 10-15      Interval Training   Interval Training No      Oxygen    Oxygen  Continuous    Liters 2-3      Treadmill   MPH 1.8    Grade 0    Minutes 15    METs 2.38      Recumbant Bike   Level 1.1    Watts 15    Minutes 15    METs 2.55      NuStep   Level 3    Minutes 15    METs 2.6      Oxygen    Maintain Oxygen  Saturation 88% or higher          Nutrition:  Target Goals: Understanding of nutrition guidelines, daily intake of sodium 1500mg , cholesterol 200mg , calories 30% from fat and 7% or less from saturated fats, daily to have 5 or more servings of fruits and vegetables.  Education: All About Nutrition: -Group instruction  provided by verbal, written material, interactive activities, discussions, models, and posters to present general guidelines for heart healthy nutrition including fat, fiber, MyPlate, the role of sodium in heart healthy nutrition, utilization of the nutrition label, and utilization of this knowledge for meal planning. Follow up email sent as well. Written material given at graduation. Flowsheet Row Pulmonary Rehab from 02/27/2024 in [facility] Cardiac and Pulmonary Rehab  Education need identified 01/17/24  Date 01/30/24  Educator jg part 1  Instruction Review Code 1- Verbalizes Understanding    Biometrics:  Pre Biometrics - 01/17/24 1151       Pre Biometrics   Height 5' 4.25 (1.632 m)  Weight 188 lb 14.4 oz (85.7 kg)    Waist Circumference 40 inches    Hip Circumference 45 inches    Waist to Hip Ratio 0.89 %    BMI (Calculated) 32.17    Single Leg Stand 1.6 seconds           Nutrition Therapy Plan and Nutrition Goals:  Nutrition Therapy & Goals - 01/17/24 1124       Nutrition Therapy   Diet carb controlled, Cardiac Low Na    Protein (specify units) 90    Fiber 25 grams    Whole Grain Foods 3 servings    Saturated Fats 15 max. grams    Fruits and Vegetables 5 servings/day    Sodium 2 grams      Personal Nutrition Goals   Nutrition Goal Eat 15-30gProtein and 30-60gCarbs at each meal.    Personal Goal #2 Read labels and reduce sodium intake to below 2300mg . Ideally 1500mg  per day.    Comments Patient drinking mostly water, getting ~40-64oz daily. She says she usually eats 3 meals per day. Reviewed her food recall, educated on importance of pairing quality carbs with protein and healthy fat. She already monitors her sodium intake, says she doesn't cook or use it at the table. Encouraged her to read labels and try and keep sodium below 1500mg . Provided Mediterranean diet handout. Educated on types of fats, sources, and how to read labels. Brainstormed several meals and snack ideas  and went over sample plate handout.      Intervention Plan   Intervention Prescribe, educate and counsel regarding individualized specific dietary modifications aiming towards targeted core components such as weight, hypertension, lipid management, diabetes, heart failure and other comorbidities.;Nutrition handout(s) given to patient.    Expected Outcomes Short Term Goal: Understand basic principles of dietary content, such as calories, fat, sodium, cholesterol and nutrients.;Short Term Goal: A plan has been developed with personal nutrition goals set during dietitian appointment.;Long Term Goal: Adherence to prescribed nutrition plan.          Nutrition Assessments:  MEDIFICTS Score Key: >=70 Need to make dietary changes  40-70 Heart Healthy Diet <= 40 Therapeutic Level Cholesterol Diet  Flowsheet Row Pulmonary Rehab from 01/17/2024 in [facility] Cardiac and Pulmonary Rehab  Picture Your Plate Total Score on Admission 58   Picture Your Plate Scores: <59 Unhealthy dietary pattern with much room for improvement. 41-50 Dietary pattern unlikely to meet recommendations for good health and room for improvement. 51-60 More healthful dietary pattern, with some room for improvement.  >60 Healthy dietary pattern, although there may be some specific behaviors that could be improved.   Nutrition Goals Re-Evaluation:   Nutrition Goals Discharge (Final Nutrition Goals Re-Evaluation):   Psychosocial: Target Goals: Acknowledge presence or absence of significant depression and/or stress, maximize coping skills, provide positive support system. Participant is able to verbalize types and ability to use techniques and skills needed for reducing stress and depression.   Education: Stress, Anxiety, and Depression - Group verbal and visual presentation to define topics covered.  Reviews how body is impacted by stress, anxiety, and depression.  Also discusses healthy ways to reduce stress and to treat/manage  anxiety and depression.  Written material given at graduation. Flowsheet Row Pulmonary Rehab from 08/23/2022 in [facility] Cardiac and Pulmonary Rehab  Date 06/21/22  Educator [facility]  Instruction Review Code 1- [employer] Understanding    Education: Sleep Hygiene -Provides group verbal and written instruction about how sleep can affect your health.  Define sleep  hygiene, discuss sleep cycles and impact of sleep habits. Review good sleep hygiene tips.    Initial Review & Psychosocial Screening:  Initial Psych Review & Screening - 01/15/24 1309       Initial Review   Current issues with Current Depression      Family Dynamics   Good Support System? Yes   friends     Barriers   Psychosocial barriers to participate in program The patient should benefit from training in stress management and relaxation.;There are no identifiable barriers or psychosocial needs.      Screening Interventions   Interventions Encouraged to exercise;To provide support and resources with identified psychosocial needs    Expected Outcomes Short Term goal: Utilizing psychosocial counselor, staff and physician to assist with identification of specific Stressors or current issues interfering with healing process. Setting desired goal for each stressor or current issue identified.;Long Term Goal: Stressors or current issues are controlled or eliminated.;Short Term goal: Identification and review with participant of any Quality of Life or Depression concerns found by scoring the questionnaire.;Long Term goal: The participant improves quality of Life and PHQ9 Scores as seen by post scores and/or verbalization of changes          Quality of Life Scores:  Scores of 19 and below usually indicate a poorer quality of life in these areas.  A difference of  2-3 points is a clinically meaningful difference.  A difference of 2-3 points in the total score of the Quality of Life Index has been associated with significant improvement in  overall quality of life, self-image, physical symptoms, and general health in studies assessing change in quality of life.  PHQ-9: Review Flowsheet  More data exists      01/17/2024 11/01/2023 06/07/2023 02/12/2023 12/12/2022  Depression screen PHQ 2/9  Decreased Interest 0 0 0 0 0  Down, Depressed, Hopeless 1 0 0 0 0  PHQ - 2 Score 1 0 0 0 0  Altered sleeping 0 - - - -  Tired, decreased energy 2 - - - -  Change in appetite 0 - - - -  Feeling bad or failure about yourself  0 - - - -  Trouble concentrating 0 - - - -  Moving slowly or fidgety/restless 0 - - - -  Suicidal thoughts 0 - - - -  PHQ-9 Score 3 - - - -  Difficult doing work/chores Not difficult at all - - - -   Interpretation of Total Score  Total Score Depression Severity:  1-4 = Minimal depression, 5-9 = Mild depression, 10-14 = Moderate depression, 15-19 = Moderately severe depression, 20-27 = Severe depression   Psychosocial Evaluation and Intervention:  Psychosocial Evaluation - 01/15/24 1324       Psychosocial Evaluation & Interventions   Comments Resa is returning to pulmonary rehab with pulmonary hypertension. She states she has been working on regulating her medications and prioritizing fluid balance. She wants to attend to work on her stamina and her breathing. She has a history of depression and mentions that its a daily choice on how she is going to feel. She does have friends that help her as well as a monthly call from a support system through her insurance.    Expected Outcomes Short: attend pulmonary rehab for education and exercise Long: develop and maintain positive self care habits.    Continue Psychosocial Services  Follow up required by staff          Psychosocial Re-Evaluation:   Psychosocial  Discharge (Final Psychosocial Re-Evaluation):   Education: Education Goals: Education classes will be provided on a weekly basis, covering required topics. Participant will state understanding/return  demonstration of topics presented.  Learning Barriers/Preferences:   General Pulmonary Education Topics:  Infection Prevention: - Provides verbal and written material to individual with discussion of infection control including proper hand washing and proper equipment cleaning during exercise session. Flowsheet Row Pulmonary Rehab from 02/27/2024 in [facility] Cardiac and Pulmonary Rehab  Date 01/17/24  Educator NT  Instruction Review Code 1- Verbalizes Understanding    Falls Prevention: - Provides verbal and written material to individual with discussion of falls prevention and safety. Flowsheet Row Pulmonary Rehab from 02/27/2024 in [facility] Cardiac and Pulmonary Rehab  Date 01/17/24  Educator NT  Instruction Review Code 1- Verbalizes Understanding    Chronic Lung Disease Review: - Group verbal instruction with posters, models, PowerPoint presentations and videos,  to review new updates, new respiratory medications, new advancements in procedures and treatments. Providing information on websites and 800 numbers for continued self-education. Includes information about supplement oxygen , available portable oxygen  systems, continuous and intermittent flow rates, oxygen  safety, concentrators, and Medicare reimbursement for oxygen . Explanation of Pulmonary Drugs, including class, frequency, complications, importance of spacers, rinsing mouth after steroid MDI's, and proper cleaning methods for nebulizers. Review of basic lung anatomy and physiology related to function, structure, and complications of lung disease. Review of risk factors. Discussion about methods for diagnosing sleep apnea and types of masks and machines for OSA. Includes a review of the use of types of environmental controls: home humidity, furnaces, filters, dust mite/pet prevention, HEPA vacuums. Discussion about weather changes, air quality and the benefits of nasal washing. Instruction on Warning signs, infection symptoms, calling MD  promptly, preventive modes, and value of vaccinations. Review of effective airway clearance, coughing and/or vibration techniques. Emphasizing that all should Create an Action Plan. Written material given at graduation. Flowsheet Row Pulmonary Rehab from 02/27/2024 in [facility] Cardiac and Pulmonary Rehab  Education need identified 01/17/24    AED/CPR: - Group verbal and written instruction with the use of models to demonstrate the basic use of the AED with the basic ABC's of resuscitation.    Anatomy and Cardiac Procedures: - Group verbal and visual presentation and models provide information about basic cardiac anatomy and function. Reviews the testing methods done to diagnose heart disease and the outcomes of the test results. Describes the treatment choices: Medical Management, Angioplasty, or Coronary Bypass Surgery for treating various heart conditions including Myocardial Infarction, Angina, Valve Disease, and Cardiac Arrhythmias.  Written material given at graduation. Flowsheet Row Pulmonary Rehab from 02/27/2024 in [facility] Cardiac and Pulmonary Rehab  Date 02/13/24  Educator [facility]  Instruction Review Code 1- Verbalizes Understanding    Medication Safety: - Group verbal and visual instruction to review commonly prescribed medications for heart and lung disease. Reviews the medication, class of the drug, and side effects. Includes the steps to properly store meds and maintain the prescription regimen.  Written material given at graduation. Flowsheet Row Pulmonary Rehab from 02/27/2024 in [facility] Cardiac and Pulmonary Rehab  Date 02/20/24  Educator sb  Instruction Review Code 1- Verbalizes Understanding    Other: -Provides group and verbal instruction on various topics (see comments)   Knowledge Questionnaire Score:  Knowledge Questionnaire Score - 01/17/24 1147       Knowledge Questionnaire Score   Pre Score 13/18           Core Components/Risk Factors/Patient Goals at Admission:  Personal Goals and Risk Factors at Admission - 01/15/24 1307       Core Components/Risk Factors/Patient Goals on Admission    Weight Management Yes;Weight Loss   fluid balance   Intervention Weight Management: Develop a combined nutrition and exercise program designed to reach desired caloric intake, while maintaining appropriate intake of nutrient and fiber, sodium and fats, and appropriate energy expenditure required for the weight goal.;Weight Management: Provide education and appropriate resources to help participant work on and attain dietary goals.;Weight Management/Obesity: Establish reasonable short term and long term weight goals.;Obesity: Provide education and appropriate resources to help participant work on and attain dietary goals.    Expected Outcomes Short Term: Continue to assess and modify interventions until short term weight is achieved;Long Term: Adherence to nutrition and physical activity/exercise program aimed toward attainment of established weight goal;Weight Loss: Understanding of general recommendations for a balanced deficit meal plan, which promotes 1-2 lb weight loss per week and includes a negative energy balance of [phone] kcal/d;Understanding recommendations for meals to include 15-35% energy as protein, 25-35% energy from fat, 35-60% energy from carbohydrates, less than 200mg  of dietary cholesterol, 20-35 gm of total fiber daily;Understanding of distribution of calorie intake throughout the day with the consumption of 4-5 meals/snacks    Improve shortness of breath with ADL's Yes    Intervention Provide education, individualized exercise plan and daily activity instruction to help decrease symptoms of SOB with activities of daily living.    Expected Outcomes Short Term: Improve cardiorespiratory fitness to achieve a reduction of symptoms when performing ADLs;Long Term: Be able to perform more ADLs without symptoms or delay the onset of symptoms    Diabetes Yes     Intervention Provide education about signs/symptoms and action to take for hypo/hyperglycemia.;Provide education about proper nutrition, including hydration, and aerobic/resistive exercise prescription along with prescribed medications to achieve blood glucose in normal ranges: Fasting glucose 65-99 mg/dL    Expected Outcomes Short Term: Participant verbalizes understanding of the signs/symptoms and immediate care of hyper/hypoglycemia, proper foot care and importance of medication, aerobic/resistive exercise and nutrition plan for blood glucose control.;Long Term: Attainment of HbA1C < 7%.    Heart Failure Yes    Intervention Provide a combined exercise and nutrition program that is supplemented with education, support and counseling about heart failure. Directed toward relieving symptoms such as shortness of breath, decreased exercise tolerance, and extremity edema.    Expected Outcomes Improve functional capacity of life;Short term: Attendance in program 2-3 days a week with increased exercise capacity. Reported lower sodium intake. Reported increased fruit and vegetable intake. Reports medication compliance.;Short term: Daily weights obtained and reported for increase. Utilizing diuretic protocols set by physician.;Long term: Adoption of self-care skills and reduction of barriers for early signs and symptoms recognition and intervention leading to self-care maintenance.          Education:Diabetes - Individual verbal and written instruction to review signs/symptoms of diabetes, desired ranges of glucose level fasting, after meals and with exercise. Acknowledge that pre and post exercise glucose checks will be done for 3 sessions at entry of program. Flowsheet Row Pulmonary Rehab from 08/23/2022 in [facility] Cardiac and Pulmonary Rehab  Date 05/15/22  Educator [facility]  Instruction Review Code 1- Verbalizes Understanding    Know Your Numbers and Heart Failure: - Group verbal and visual instruction to  discuss disease risk factors for cardiac and pulmonary disease and treatment options.  Reviews associated critical values for Overweight/Obesity, Hypertension, Cholesterol, and Diabetes.  Discusses basics of  heart failure: signs/symptoms and treatments.  Introduces Heart Failure Zone chart for action plan for heart failure.  Written material given at graduation. Flowsheet Row Pulmonary Rehab from 02/27/2024 in [facility] Cardiac and Pulmonary Rehab  Date 02/27/24  Educator sb  Instruction Review Code 1- Verbalizes Understanding    Core Components/Risk Factors/Patient Goals Review:    Core Components/Risk Factors/Patient Goals at Discharge (Final Review):    ITP Comments:  ITP Comments     Row Name 01/15/24 1320 01/17/24 1145 01/23/24 1133 01/30/24 1050 02/27/24 1055   ITP Comments Initial phone call completed. Diagnosis can be found in [facility] 5/12. EP Orientation scheduled for Thursday 5/22 at 9:30am. Completed and gym orientation for respiratory care services. Initial ITP created and sent for review to Dr. Faud Aleskerov, Medical Director. First full day of exercise!  Patient was oriented to gym and equipment including functions, settings, policies, and procedures.  Patient's individual exercise prescription and treatment plan were reviewed.  All starting workloads were established based on the results of the 6 minute walk test done at initial orientation visit.  The plan for exercise progression was also introduced and progression will be customized based on patient's performance and goals. 30 Day review completed. Medical Director ITP review done, changes made as directed, and signed approval by Medical Director.    new to program 30 Day review completed. Medical Director ITP review done, changes made as directed, and signed approval by Medical Director.      Comments: 30 day review

## 2024-02-27 NOTE — Progress Notes (Signed)
 Daily Session Note  Patient Details  Name: Amber Holder MRN: [id number] Date of Birth: Jan 19, 1946 Referring Provider:   Flowsheet Row Pulmonary Rehab from 01/17/2024 in [facility] Cardiac and Pulmonary Rehab  Referring Provider Dr. Elfreda Bathe, MD    Encounter Date: 02/27/2024  Check In:  Session Check In - 02/27/24 1009       Check-In   Supervising physician immediately available to respond to emergencies See telemetry face sheet for immediately available ER MD    Location [facility]-Cardiac & Pulmonary Rehab    Staff Present Rollene Paterson, MS, Exercise Physiologist;Abdel Effinger RN,BSN,MPA;Maxon Conetta BS, Exercise Physiologist;Joseph Rolinda RCP,RRT,BSRT    Virtual Visit No    Medication changes reported     No    Fall or balance concerns reported    No    Tobacco Cessation No Change    Warm-up and Cool-down Performed on first and last piece of equipment    Resistance Training Performed Yes    VAD Patient? No    PAD/SET Patient? No      Pain Assessment   Currently in Pain? No/denies             Social History   Tobacco Use  Smoking Status Former   Current packs/day: 0.00   Average packs/day: 1 pack/day for 50.0 years (50.0 ttl pk-yrs)   Types: Cigarettes   Start date: 12/29/1971   Quit date: 12/28/2021   Years since quitting: 2.1   Passive exposure: Never  Smokeless Tobacco Never    Goals Met:  Independence with exercise equipment Exercise tolerated well No report of concerns or symptoms today Strength training completed today  Goals Unmet:  Not Applicable  Comments: Pt able to follow exercise prescription today without complaint.  Will continue to monitor for progression.    Dr. Oneil Pinal is Medical Director for [facility] Cardiac Rehabilitation.  Dr. Fuad Aleskerov is Medical Director for [facility] Pulmonary Rehabilitation.

## 2024-03-03 ENCOUNTER — Encounter

## 2024-03-03 DIAGNOSIS — I2721 Secondary pulmonary arterial hypertension: Secondary | ICD-10-CM | POA: Diagnosis not present

## 2024-03-03 NOTE — Progress Notes (Signed)
 Daily Session Note  Patient Details  Name: Amber Holder MRN: [id number] Date of Birth: 30-May-1946 Referring Provider:   Flowsheet Row Pulmonary Rehab from 01/17/2024 in [facility] Cardiac and Pulmonary Rehab  Referring Provider Dr. Elfreda Bathe, MD    Encounter Date: 03/03/2024  Check In:  Session Check In - 03/03/24 1107       Check-In   Supervising physician immediately available to respond to emergencies See telemetry face sheet for immediately available ER MD    Location [facility]-Cardiac & Pulmonary Rehab    Staff Present Burnard Davenport RN,BSN,MPA;Joseph Rolinda RCP,RRT,BSRT;Meredith Tressa RN,BSN    Virtual Visit No    Medication changes reported     No    Fall or balance concerns reported    No    Tobacco Cessation No Change    Warm-up and Cool-down Performed on first and last piece of equipment    Resistance Training Performed Yes    VAD Patient? No    PAD/SET Patient? No      Pain Assessment   Currently in Pain? No/denies             Social History   Tobacco Use  Smoking Status Former   Current packs/day: 0.00   Average packs/day: 1 pack/day for 50.0 years (50.0 ttl pk-yrs)   Types: Cigarettes   Start date: 12/29/1971   Quit date: 12/28/2021   Years since quitting: 2.1   Passive exposure: Never  Smokeless Tobacco Never    Goals Met:  Independence with exercise equipment Exercise tolerated well No report of concerns or symptoms today Strength training completed today  Goals Unmet:  Not Applicable  Comments: Pt able to follow exercise prescription today without complaint.  Will continue to monitor for progression.    Dr. Oneil Pinal is Medical Director for [facility] Cardiac Rehabilitation.  Dr. Fuad Aleskerov is Medical Director for [facility] Pulmonary Rehabilitation.

## 2024-03-04 DIAGNOSIS — K529 Noninfective gastroenteritis and colitis, unspecified: Secondary | ICD-10-CM | POA: Diagnosis not present

## 2024-03-04 DIAGNOSIS — R152 Fecal urgency: Secondary | ICD-10-CM | POA: Diagnosis not present

## 2024-03-04 DIAGNOSIS — R159 Full incontinence of feces: Secondary | ICD-10-CM | POA: Diagnosis not present

## 2024-03-05 ENCOUNTER — Encounter

## 2024-03-05 DIAGNOSIS — I2721 Secondary pulmonary arterial hypertension: Secondary | ICD-10-CM

## 2024-03-05 NOTE — Progress Notes (Signed)
 Daily Session Note  Patient Details  Name: Amber Holder MRN: [id number] Date of Birth: 1945-11-14 Referring Provider:   Flowsheet Row Pulmonary Rehab from 01/17/2024 in [facility] Cardiac and Pulmonary Rehab  Referring Provider Dr. Elfreda Bathe, MD    Encounter Date: 03/05/2024  Check In:  Session Check In - 03/05/24 1013       Check-In   Supervising physician immediately available to respond to emergencies See telemetry face sheet for immediately available ER MD    Location [facility]-Cardiac & Pulmonary Rehab    Staff Present Rollene Paterson, MS, Exercise Physiologist;Jason Elnor RDN,LDN;Syanne Looney RN,BSN,MPA;Joseph Rolinda RCP,RRT,BSRT    Virtual Visit No    Medication changes reported     No    Fall or balance concerns reported    No    Tobacco Cessation No Change    Warm-up and Cool-down Performed on first and last piece of equipment    Resistance Training Performed Yes    VAD Patient? No    PAD/SET Patient? No      Pain Assessment   Currently in Pain? No/denies             Social History   Tobacco Use  Smoking Status Former   Current packs/day: 0.00   Average packs/day: 1 pack/day for 50.0 years (50.0 ttl pk-yrs)   Types: Cigarettes   Start date: 12/29/1971   Quit date: 12/28/2021   Years since quitting: 2.1   Passive exposure: Never  Smokeless Tobacco Never    Goals Met:  Independence with exercise equipment Exercise tolerated well No report of concerns or symptoms today Strength training completed today  Goals Unmet:  Not Applicable  Comments: Pt able to follow exercise prescription today without complaint.  Will continue to monitor for progression.    Dr. Oneil Pinal is Medical Director for [facility] Cardiac Rehabilitation.  Dr. Fuad Aleskerov is Medical Director for [facility] Pulmonary Rehabilitation.

## 2024-03-06 ENCOUNTER — Ambulatory Visit (INDEPENDENT_AMBULATORY_CARE_PROVIDER_SITE_OTHER): Payer: MEDICARE | Admitting: Physician Assistant

## 2024-03-06 ENCOUNTER — Encounter: Payer: Self-pay | Admitting: Physician Assistant

## 2024-03-06 VITALS — BP 108/73 | HR 66 | Temp 98.3°F | Resp 16 | Ht 65.0 in | Wt 190.2 lb

## 2024-03-06 DIAGNOSIS — E1165 Type 2 diabetes mellitus with hyperglycemia: Secondary | ICD-10-CM

## 2024-03-06 DIAGNOSIS — Z77128 Contact with and (suspected) exposure to other hazards in the physical environment: Secondary | ICD-10-CM | POA: Diagnosis not present

## 2024-03-06 DIAGNOSIS — Z5919 Other inadequate housing: Secondary | ICD-10-CM

## 2024-03-06 DIAGNOSIS — J9611 Chronic respiratory failure with hypoxia: Secondary | ICD-10-CM

## 2024-03-06 DIAGNOSIS — J449 Chronic obstructive pulmonary disease, unspecified: Secondary | ICD-10-CM | POA: Diagnosis not present

## 2024-03-06 LAB — POCT GLYCOSYLATED HEMOGLOBIN (HGB A1C): Hemoglobin A1C: 6.2 % — AB (ref 4.0–5.6)

## 2024-03-06 MED ORDER — ALBUTEROL SULFATE HFA 108 (90 BASE) MCG/ACT IN AERS: 2.0000 | INHALATION_SPRAY | Freq: Four times a day (QID) | RESPIRATORY_TRACT | 2 refills | Status: AC | PRN
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
## 2024-03-10 ENCOUNTER — Encounter

## 2024-03-10 DIAGNOSIS — I2721 Secondary pulmonary arterial hypertension: Secondary | ICD-10-CM

## 2024-03-10 NOTE — Progress Notes (Signed)
 Daily Session Note  Patient Details  Name: Amber Holder MRN: [id number] Date of Birth: Jul 08, 1946 Referring Provider:   Flowsheet Row Pulmonary Rehab from 01/17/2024 in [facility] Cardiac and Pulmonary Rehab  Referring Provider Dr. Elfreda Bathe, MD    Encounter Date: 03/10/2024  Check In:  Session Check In - 03/10/24 1119       Check-In   Supervising physician immediately available to respond to emergencies See telemetry face sheet for immediately available ER MD    Location [facility]-Cardiac & Pulmonary Rehab    Staff Present Burnard Davenport RN,BSN,MPA;Joseph Rolinda RCP,RRT,BSRT;Kristen Coble RN,BC,MSN;Jason Elnor RDN,LDN;Meredith Tressa RN,BSN    Virtual Visit No    Medication changes reported     No    Fall or balance concerns reported    No    Tobacco Cessation No Change    Warm-up and Cool-down Performed on first and last piece of equipment    Resistance Training Performed Yes    VAD Patient? No    PAD/SET Patient? No      Pain Assessment   Currently in Pain? No/denies             Social History   Tobacco Use  Smoking Status Former   Current packs/day: 0.00   Average packs/day: 1 pack/day for 50.0 years (50.0 ttl pk-yrs)   Types: Cigarettes   Start date: 12/29/1971   Quit date: 12/28/2021   Years since quitting: 2.2   Passive exposure: Never  Smokeless Tobacco Never    Goals Met:  Independence with exercise equipment Exercise tolerated well No report of concerns or symptoms today Strength training completed today  Goals Unmet:  Not Applicable  Comments: Pt able to follow exercise prescription today without complaint.  Will continue to monitor for progression.    Dr. Oneil Pinal is Medical Director for [facility] Cardiac Rehabilitation.  Dr. Fuad Aleskerov is Medical Director for [facility] Pulmonary Rehabilitation.

## 2024-03-12 ENCOUNTER — Encounter

## 2024-03-12 DIAGNOSIS — I2721 Secondary pulmonary arterial hypertension: Secondary | ICD-10-CM

## 2024-03-12 NOTE — Progress Notes (Signed)
 Daily Session Note  Patient Details  Name: Amber Holder MRN: [id number] Date of Birth: July 19, 1946 Referring Provider:   Flowsheet Row Pulmonary Rehab from 01/17/2024 in [facility] Cardiac and Pulmonary Rehab  Referring Provider Dr. Elfreda Bathe, MD    Encounter Date: 03/12/2024  Check In:  Session Check In - 03/12/24 1107       Check-In   Supervising physician immediately available to respond to emergencies See telemetry face sheet for immediately available ER MD    Location [facility]-Cardiac & Pulmonary Rehab    Staff Present Burnard Davenport RN,BSN,MPA;Maxon Burnell BS, Exercise Physiologist;Joseph [facility] Dyane BS, ACSM CEP, Exercise Physiologist;Jason Elnor RDN,LDN    Virtual Visit No    Medication changes reported     No    Fall or balance concerns reported    No    Tobacco Cessation No Change    Warm-up and Cool-down Performed on first and last piece of equipment    Resistance Training Performed Yes    VAD Patient? No    PAD/SET Patient? No      Pain Assessment   Currently in Pain? No/denies             Social History   Tobacco Use  Smoking Status Former   Current packs/day: 0.00   Average packs/day: 1 pack/day for 50.0 years (50.0 ttl pk-yrs)   Types: Cigarettes   Start date: 12/29/1971   Quit date: 12/28/2021   Years since quitting: 2.2   Passive exposure: Never  Smokeless Tobacco Never    Goals Met:  Independence with exercise equipment Exercise tolerated well No report of concerns or symptoms today Strength training completed today  Goals Unmet:  Not Applicable  Comments: Pt able to follow exercise prescription today without complaint.  Will continue to monitor for progression.    Dr. Oneil Pinal is Medical Director for [facility] Cardiac Rehabilitation.  Dr. Fuad Aleskerov is Medical Director for [facility] Pulmonary Rehabilitation.

## 2024-03-17 ENCOUNTER — Encounter

## 2024-03-17 ENCOUNTER — Ambulatory Visit: Payer: Medicare Other | Admitting: Internal Medicine

## 2024-03-17 DIAGNOSIS — I2721 Secondary pulmonary arterial hypertension: Secondary | ICD-10-CM

## 2024-03-17 NOTE — Progress Notes (Signed)
 Daily Session Note  Patient Details  Name: Amber Holder MRN: [id number] Date of Birth: Dec 26, 1945 Referring Provider:   Flowsheet Row Pulmonary Rehab from 01/17/2024 in [facility] Cardiac and Pulmonary Rehab  Referring Provider Dr. Elfreda Bathe, MD    Encounter Date: 03/17/2024  Check In:  Session Check In - 03/17/24 1055       Check-In   Supervising physician immediately available to respond to emergencies See telemetry face sheet for immediately available ER MD    Location [facility]-Cardiac & Pulmonary Rehab    Staff Present Burnard Davenport RN,BSN,MPA;Joseph Hood RCP,RRT,BSRT;Maxon Burnell BS, Exercise Physiologist;Muriah Harsha Dyane BS, ACSM CEP, Exercise Physiologist    Virtual Visit No    Medication changes reported     No    Fall or balance concerns reported    No    Tobacco Cessation No Change    Warm-up and Cool-down Performed on first and last piece of equipment    Resistance Training Performed Yes    VAD Patient? No    PAD/SET Patient? No      Pain Assessment   Currently in Pain? No/denies             Social History   Tobacco Use  Smoking Status Former   Current packs/day: 0.00   Average packs/day: 1 pack/day for 50.0 years (50.0 ttl pk-yrs)   Types: Cigarettes   Start date: 12/29/1971   Quit date: 12/28/2021   Years since quitting: 2.2   Passive exposure: Never  Smokeless Tobacco Never    Goals Met:  Independence with exercise equipment Exercise tolerated well No report of concerns or symptoms today Strength training completed today  Goals Unmet:  Not Applicable  Comments: Pt able to follow exercise prescription today without complaint.  Will continue to monitor for progression.    Dr. Oneil Pinal is Medical Director for [facility] Cardiac Rehabilitation.  Dr. Fuad Aleskerov is Medical Director for [facility] Pulmonary Rehabilitation.

## 2024-03-19 ENCOUNTER — Encounter

## 2024-03-19 DIAGNOSIS — I2721 Secondary pulmonary arterial hypertension: Secondary | ICD-10-CM

## 2024-03-19 NOTE — Progress Notes (Signed)
 Daily Session Note  Patient Details  Name: Amber Holder MRN: [id number] Date of Birth: Jan 13, 1946 Referring Provider:   Flowsheet Row Pulmonary Rehab from 01/17/2024 in [facility] Cardiac and Pulmonary Rehab  Referring Provider Dr. Elfreda Bathe, MD    Encounter Date: 03/19/2024  Check In:  Session Check In - 03/19/24 1112       Check-In   Supervising physician immediately available to respond to emergencies See telemetry face sheet for immediately available ER MD    Location [facility]-Cardiac & Pulmonary Rehab    Staff Present Burnard Davenport RN,BSN,MPA;Joseph [facility] RCP,RRT,BSRT;Margaret Best, MS, Exercise Physiologist;Jason Elnor RDN,LDN;Noah Tickle, BS, Exercise Physiologist    Virtual Visit No    Medication changes reported     No    Fall or balance concerns reported    No    Tobacco Cessation No Change    Warm-up and Cool-down Performed on first and last piece of equipment    Resistance Training Performed Yes    VAD Patient? No    PAD/SET Patient? No      Pain Assessment   Currently in Pain? No/denies             Social History   Tobacco Use  Smoking Status Former   Current packs/day: 0.00   Average packs/day: 1 pack/day for 50.0 years (50.0 ttl pk-yrs)   Types: Cigarettes   Start date: 12/29/1971   Quit date: 12/28/2021   Years since quitting: 2.2   Passive exposure: Never  Smokeless Tobacco Never    Goals Met:  Independence with exercise equipment Exercise tolerated well No report of concerns or symptoms today Strength training completed today  Goals Unmet:  Not Applicable  Comments: Pt able to follow exercise prescription today without complaint.  Will continue to monitor for progression.    Dr. Oneil Pinal is Medical Director for [facility] Cardiac Rehabilitation.  Dr. Fuad Aleskerov is Medical Director for [facility] Pulmonary Rehabilitation.

## 2024-03-21 DIAGNOSIS — R110 Nausea: Secondary | ICD-10-CM | POA: Diagnosis not present

## 2024-03-21 DIAGNOSIS — R197 Diarrhea, unspecified: Secondary | ICD-10-CM | POA: Diagnosis not present

## 2024-03-21 DIAGNOSIS — R109 Unspecified abdominal pain: Secondary | ICD-10-CM | POA: Diagnosis not present

## 2024-03-24 ENCOUNTER — Encounter

## 2024-03-24 DIAGNOSIS — I2721 Secondary pulmonary arterial hypertension: Secondary | ICD-10-CM

## 2024-03-24 NOTE — Progress Notes (Signed)
 Daily Session Note  Patient Details  Name: Amber Holder MRN: [id number] Date of Birth: 31-May-1946 Referring Provider:   Flowsheet Row Pulmonary Rehab from 01/17/2024 in [facility] Cardiac and Pulmonary Rehab  Referring Provider Dr. Elfreda Bathe, MD    Encounter Date: 03/24/2024  Check In:  Session Check In - 03/24/24 1048       Check-In   Supervising physician immediately available to respond to emergencies See telemetry face sheet for immediately available ER MD    Location [facility]-Cardiac & Pulmonary Rehab    Staff Present Burnard Davenport RN,BSN,MPA;Maxon Burnell BS, Exercise Physiologist;Joseph [employer] BS, ACSM CEP, Exercise Physiologist    Virtual Visit No    Medication changes reported     No    Fall or balance concerns reported    No    Tobacco Cessation No Change    Warm-up and Cool-down Performed on first and last piece of equipment    Resistance Training Performed Yes    VAD Patient? No    PAD/SET Patient? No      Pain Assessment   Currently in Pain? No/denies             Social History   Tobacco Use  Smoking Status Former   Current packs/day: 0.00   Average packs/day: 1 pack/day for 50.0 years (50.0 ttl pk-yrs)   Types: Cigarettes   Start date: 12/29/1971   Quit date: 12/28/2021   Years since quitting: 2.2   Passive exposure: Never  Smokeless Tobacco Never    Goals Met:  Proper associated with RPD/PD & O2 Sat Independence with exercise equipment Using PLB without cueing & demonstrates good technique Exercise tolerated well No report of concerns or symptoms today Strength training completed today  Goals Unmet:  Not Applicable  Comments: Pt able to follow exercise prescription today without complaint.  Will continue to monitor for progression.    Dr. Oneil Pinal is Medical Director for [facility] Cardiac Rehabilitation.  Dr. Fuad Aleskerov is Medical Director for [facility] Pulmonary Rehabilitation.

## 2024-03-26 ENCOUNTER — Encounter

## 2024-03-26 DIAGNOSIS — I2721 Secondary pulmonary arterial hypertension: Secondary | ICD-10-CM

## 2024-03-26 DIAGNOSIS — J449 Chronic obstructive pulmonary disease, unspecified: Secondary | ICD-10-CM

## 2024-03-26 NOTE — Progress Notes (Signed)
 Daily Session Note  Patient Details  Name: Amber Holder MRN: [id number] Date of Birth: 13-Jul-1946 Referring Provider:   Flowsheet Row Pulmonary Rehab from 01/17/2024 in [facility] Cardiac and Pulmonary Rehab  Referring Provider Dr. Elfreda Bathe, MD    Encounter Date: 03/26/2024  Check In:  Session Check In - 03/26/24 1056       Check-In   Supervising physician immediately available to respond to emergencies See telemetry face sheet for immediately available ER MD    Location [facility]-Cardiac & Pulmonary Rehab    Staff Present Burnard Davenport RN,BSN,MPA;Joseph [facility] RCP,RRT,BSRT;Margaret Best, MS, Exercise Physiologist;Jason Elnor RDN,LDN;Noah Tickle, BS, Exercise Physiologist    Virtual Visit No    Medication changes reported     No    Fall or balance concerns reported    No    Tobacco Cessation No Change    Warm-up and Cool-down Performed on first and last piece of equipment    Resistance Training Performed Yes    VAD Patient? No    PAD/SET Patient? No      Pain Assessment   Currently in Pain? No/denies             Social History   Tobacco Use  Smoking Status Former   Current packs/day: 0.00   Average packs/day: 1 pack/day for 50.0 years (50.0 ttl pk-yrs)   Types: Cigarettes   Start date: 12/29/1971   Quit date: 12/28/2021   Years since quitting: 2.2   Passive exposure: Never  Smokeless Tobacco Never    Goals Met:  Proper associated with RPD/PD & O2 Sat Independence with exercise equipment Using PLB without cueing & demonstrates good technique Exercise tolerated well No report of concerns or symptoms today Strength training completed today  Goals Unmet:  Not Applicable  Comments: Pt able to follow exercise prescription today without complaint.  Will continue to monitor for progression.    Dr. Oneil Pinal is Medical Director for [facility] Cardiac Rehabilitation.  Dr. Fuad Aleskerov is Medical Director for [facility] Pulmonary Rehabilitation.

## 2024-03-26 NOTE — Progress Notes (Signed)
 Pulmonary Individual Treatment Plan  Patient Details  Name: Amber Holder MRN: [id number] Date of Birth: 1945/09/07 Referring Provider:   Flowsheet Row Pulmonary Rehab from 01/17/2024 in [facility] and Pulmonary Rehab  Referring Provider Dr. Elfreda Bathe, MD    Initial Encounter Date:  Flowsheet Row Pulmonary Rehab from 01/17/2024 in [facility] Cardiac and Pulmonary Rehab  Date 01/17/24    Visit Diagnosis: Pulmonary artery hypertension (HCC)  Chronic obstructive pulmonary disease, unspecified COPD type (HCC)  Patient's Home Medications on Admission:  Current Outpatient Medications:    acetaminophen  (TYLENOL ) 500 MG chewable tablet, Chew 1,000 mg by mouth every 8 (eight) hours as needed for pain., Disp: , Rfl:    albuterol  (VENTOLIN  HFA) 108 (90 Base) MCG/ACT inhaler, Inhale 2 puffs into the lungs every 6 (six) hours as needed for wheezing or shortness of breath., Disp: 18 g, Rfl: 2   aspirin  EC 81 MG tablet, Take 1 tablet (81 mg total) by mouth daily. Swallow whole., Disp: 90 tablet, Rfl: 3   cholecalciferol  (VITAMIN D ) 400 UNITS TABS tablet, Take 2,000 Units by mouth daily., Disp: , Rfl:    CRANBERRY PO, Take 25,000 mg by mouth daily., Disp: , Rfl:    diclofenac  Sodium (VOLTAREN ) 1 % GEL, Apply small amount to area of pain daily as needed, Disp: 2 g, Rfl: 0   ergocalciferol  (DRISDOL ) 1.25 MG (50000 UT) capsule, Take one cap q week, Disp: 12 capsule, Rfl: 3   estradiol  (ESTRACE ) 0.1 MG/GM vaginal cream, Estrogen Cream Instruction Discard applicator Apply pea sized amount to tip of finger to urethra before bed. Wash hands well after application. Use Monday, Wednesday and Friday, Disp: 42.5 g, Rfl: 12   ezetimibe  (ZETIA ) 10 MG tablet, Take 1 tablet (10 mg total) by mouth daily., Disp: 90 tablet, Rfl: 3   FARXIGA  10 MG TABS tablet, TAKE 1 TABLET BY MOUTH DAILY BEFORE BREAKFAST., Disp: 90 tablet, Rfl: 3   fluconazole  (DIFLUCAN ) 150 MG tablet, Take one tab po q week for fungal infection and as  needed, Disp: 12 tablet, Rfl: 1   Fluticasone -Umeclidin-Vilant (TRELEGY ELLIPTA ) 100-62.5-25 MCG/ACT AEPB, Use inhale  orally daily, Disp: 3 each, Rfl: 4   glucose blood (ONETOUCH VERIO) test strip, BLOOD SUGAR TESTING ONCE DAILY . DX E11.65, Disp: 100 strip, Rfl: 3   hydrocortisone  cream 0.5 %, Apply topically as needed., Disp: 30 g, Rfl: 0   ibandronate  (BONIVA ) 150 MG tablet, TAKE 1 TABLET (150 MG TOTAL) BY MOUTH EVERY 30 (THIRTY) DAYS., Disp: 3 tablet, Rfl: 3   ketoconazole (NIZORAL) 2 % cream, Apply 1 Application topically daily., Disp: , Rfl:    Lancets (ONETOUCH DELICA PLUS LANCET30G) MISC, Use  as directed twice a daily DX E11.65, Disp: 100 each, Rfl: 3   macitentan  (OPSUMIT ) 10 MG tablet, Take 1 tablet (10 mg total) by mouth daily., Disp: 90 tablet, Rfl: 5   Magnesium  250 MG TABS, Take by mouth. Takes 1 tablet 2-3 times per week, Disp: , Rfl:    montelukast  (SINGULAIR ) 10 MG tablet, Take 1 tablet (10 mg total) by mouth daily as needed., Disp: 90 tablet, Rfl: 1   Multiple Vitamins-Minerals (MULTIVITAMIN ADULTS PO), Take 1 tablet by mouth daily., Disp: , Rfl:    omeprazole  (PRILOSEC) 40 MG capsule, TAKE 1 CAPSULE (40 MG TOTAL) BY MOUTH DAILY., Disp: 90 capsule, Rfl: 3   OXYGEN , Inhale 3 L into the lungs. Pt uses [facility] for Oxygen , Disp: , Rfl:    potassium chloride  SA (KLOR-CON  M) 20 MEQ  tablet, Take 2 tablets (40 mEq total) by mouth daily., Disp: 180 tablet, Rfl: 3   spironolactone  (ALDACTONE ) 25 MG tablet, Take 1 tablet (25 mg total) by mouth daily., Disp: 90 tablet, Rfl: 3   Torsemide  40 MG TABS, Take 40 mg by mouth daily., Disp: 90 tablet, Rfl: 3  Current Facility-Administered Medications:    nitrofurantoin  (macrocrystal-monohydrate) (MACROBID ) capsule 100 mg, 100 mg, Oral, Q12H, McDonough, Tinnie POUR, PA-C  Past Medical History: Past Medical History:  Diagnosis Date   Anxiety 01/02/2012   Arthritis    Asthma    Blockage of coronary artery of heart (HCC)    Chronic  kidney disease    Kidney stones   Colon polyps    COPD (chronic obstructive pulmonary disease) (HCC)    Diabetes mellitus without complication (HCC)    GERD (gastroesophageal reflux disease)    HOH (hard of hearing)    Bilateral hearing aids   Kidney stone    Osteoporosis    Sleep apnea    Does not use C-PAP on a regular basis   Urinary tract infection    Venous stasis     Tobacco Use: Social History   Tobacco Use  Smoking Status Former   Current packs/day: 0.00   Average packs/day: 1 pack/day for 50.0 years (50.0 ttl pk-yrs)   Types: Cigarettes   Start date: 12/29/1971   Quit date: 12/28/2021   Years since quitting: 2.2   Passive exposure: Never  Smokeless Tobacco Never    Labs: Review Flowsheet  More data exists      Latest Ref Rng & Units 07/07/2022 09/05/2022 06/11/2023 11/01/2023 03/06/2024  Labs for ITP Cardiac and Pulmonary Rehab  Cholestrol 0 - 200 mg/dL 827  - 830  - -  LDL (calc) 0 - 99 mg/dL 893  - 96  - -  Direct LDL 0 - 99 mg/dL - - 899  - -  HDL-C >59 mg/dL 45  - 57  - -  Trlycerides <150 mg/dL 895  - 79  - -  Hemoglobin A1c 4.0 - 5.6 % - 6.3  6.6  6.4  6.2      Pulmonary Assessment Scores:  Pulmonary Assessment Scores     Row Name 01/17/24 1149         ADL UCSD   ADL Phase Entry     SOB Score total 64     Rest 1     Walk 3     Stairs 4     Bath 3     Dress 2     Shop 3       CAT Score   CAT Score 14       mMRC Score   mMRC Score 2        UCSD: Self-administered rating of dyspnea associated with activities of daily living (ADLs) 6-point scale (0 = not at all to 5 = maximal or unable to do because of breathlessness)  Scoring Scores range from 0 to 120.  Minimally important difference is 5 units  CAT: CAT can identify the health impairment of COPD patients and is better correlated with disease progression.  CAT has a scoring range of zero to 40. The CAT score is classified into four groups of low (less than 10), medium (10 - 20),  high (21-30) and very high (31-40) based on the impact level of disease on health status. A CAT score over 10 suggests significant symptoms.  A worsening CAT score could be explained by  an exacerbation, poor medication adherence, poor inhaler technique, or progression of COPD or comorbid conditions.  CAT MCID is 2 points  mMRC: mMRC (Modified Medical Research Council) Dyspnea Scale is used to assess the degree of baseline functional disability in patients of respiratory disease due to dyspnea. No minimal important difference is established. A decrease in score of 1 point or greater is considered a positive change.   Pulmonary Function Assessment:   Exercise Target Goals: Exercise Program Goal: Individual exercise prescription set using results from initial 6 min walk test and THRR while considering  patient's activity barriers and safety.   Exercise Prescription Goal: Initial exercise prescription builds to 30-45 minutes a day of aerobic activity, 2-3 days per week.  Home exercise guidelines will be given to patient during program as part of exercise prescription that the participant will acknowledge.  Education: Aerobic Exercise: - Group verbal and visual presentation on the components of exercise prescription. Introduces F.I.T.T principle from ACSM for exercise prescriptions.  Reviews F.I.T.T. principles of aerobic exercise including progression. Written material given at graduation. Flowsheet Row Pulmonary Rehab from 03/05/2024 in [facility] Cardiac and Pulmonary Rehab  Date 01/23/24  Educator NT  Instruction Review Code 1- Verbalizes Understanding    Education: Resistance Exercise: - Group verbal and visual presentation on the components of exercise prescription. Introduces F.I.T.T principle from ACSM for exercise prescriptions  Reviews F.I.T.T. principles of resistance exercise including progression. Written material given at graduation.    Education: Exercise & Equipment Safety: -  Individual verbal instruction and demonstration of equipment use and safety with use of the equipment. Flowsheet Row Pulmonary Rehab from 03/05/2024 in [facility] Cardiac and Pulmonary Rehab  Date 01/17/24  Educator NT  Instruction Review Code 1- Verbalizes Understanding    Education: Exercise Physiology & General Exercise Guidelines: - Group verbal and written instruction with models to review the exercise physiology of the cardiovascular system and associated critical values. Provides general exercise guidelines with specific guidelines to those with heart or lung disease.  Flowsheet Row Pulmonary Rehab from 08/23/2022 in [facility] Cardiac and Pulmonary Rehab  Date 06/28/22  Educator [facility]  Instruction Review Code 1- Verbalizes Understanding    Education: Flexibility, Balance, Mind/Body Relaxation: - Group verbal and visual presentation with interactive activity on the components of exercise prescription. Introduces F.I.T.T principle from ACSM for exercise prescriptions. Reviews F.I.T.T. principles of flexibility and balance exercise training including progression. Also discusses the mind body connection.  Reviews various relaxation techniques to help reduce and manage stress (i.e. Deep breathing, progressive muscle relaxation, and visualization). Balance handout provided to take home. Written material given at graduation.   Activity Barriers & Risk Stratification:  Activity Barriers & Cardiac Risk Stratification - 01/17/24 1150       Activity Barriers & Cardiac Risk Stratification   Activity Barriers Right Hip Replacement;Shortness of Breath;Muscular Weakness;Balance Concerns;Arthritis;Back Problems          6 Minute Walk:  6 Minute Walk     Row Name 01/17/24 1151         6 Minute Walk   Phase Initial     Distance 1050 feet     Walk Time 6 minutes     # of Rest Breaks 0     MPH 1.99     METS 1.58     RPE 13     Perceived Dyspnea  2     VO2 Peak 5.51     Symptoms Yes (comment)      Comments R leg weakness  Resting HR 62 bpm     Resting BP 132/64     Resting Oxygen  Saturation  93 %     Exercise Oxygen  Saturation  during 6 min walk 87 %     Max Ex. HR 84 bpm     Max Ex. BP 132/64     2 Minute Post BP 114/58       Interval HR   1 Minute HR 76     2 Minute HR 80     3 Minute HR 81     4 Minute HR 81     5 Minute HR 84     6 Minute HR 83     2 Minute Post HR 63     Interval Heart Rate? Yes       Interval Oxygen    Interval Oxygen ? Yes     Baseline Oxygen  Saturation % 93 %     1 Minute Oxygen  Saturation % 91 %     1 Minute Liters of Oxygen  2 L     2 Minute Oxygen  Saturation % 88 %     2 Minute Liters of Oxygen  2 L     3 Minute Oxygen  Saturation % 87 %     3 Minute Liters of Oxygen  2 L     4 Minute Oxygen  Saturation % 87 %     4 Minute Liters of Oxygen  2 L     5 Minute Oxygen  Saturation % 87 %     5 Minute Liters of Oxygen  2 L     6 Minute Oxygen  Saturation % 87 %     6 Minute Liters of Oxygen  2 L     2 Minute Post Oxygen  Saturation % 94 %     2 Minute Post Liters of Oxygen  2 L       Oxygen  Initial Assessment:  Oxygen  Initial Assessment - 01/15/24 1302       Home Oxygen    Home Oxygen  Device Home Concentrator;E-Tanks;Portable Concentrator    Sleep Oxygen  Prescription Continuous    Liters per minute 2    Home Exercise Oxygen  Prescription Continuous    Liters per minute 3   2-3   Home Resting Oxygen  Prescription Continuous    Liters per minute 2.5    Compliance with Home Oxygen  Use Yes          Oxygen  Re-Evaluation:  Oxygen  Re-Evaluation     Row Name 01/23/24 1134 03/17/24 1134           Program Oxygen  Prescription   Program Oxygen  Prescription Continuous Continuous;E-Tanks      Liters per minute 2 2        Home Oxygen    Home Oxygen  Device Home Concentrator;E-Tanks;Portable Concentrator Home Concentrator;E-Tanks;Portable Concentrator      Sleep Oxygen  Prescription Continuous Continuous      Liters per minute 2 2      Home Exercise  Oxygen  Prescription Continuous Continuous      Liters per minute 3 3      Home Resting Oxygen  Prescription Continuous Continuous      Liters per minute 2.5 2      Compliance with Home Oxygen  Use Yes Yes        Goals/Expected Outcomes   Short Term Goals To learn and demonstrate proper pursed lip breathing techniques or other breathing techniques.  To learn and demonstrate proper pursed lip breathing techniques or other breathing techniques.       Long  Term Goals Exhibits proper  breathing techniques, such as pursed lip breathing or other method taught during program session Exhibits proper breathing techniques, such as pursed lip breathing or other method taught during program session      Comments Reviewed PLB technique with pt.  Talked about how it works and it's importance in maintaining their exercise saturations. Informed patient how to perform the Pursed Lipped breathing technique. Told patient to Inhale through the nose and out the mouth with pursed lips to keep their airways open, help oxygenate them better, practice when at rest or doing strenuous activity. Patient Verbalizes understanding of technique and will work on and be reiterated during [facility].      Goals/Expected Outcomes Short: Become more profiecient at using PLB. Long: Become independent at using PLB. Short: use PLB with exertion. Long: use PLB on exertion proficiently and independently.         Oxygen  Discharge (Final Oxygen  Re-Evaluation):  Oxygen  Re-Evaluation - 03/17/24 1134       Program Oxygen  Prescription   Program Oxygen  Prescription Continuous;E-Tanks    Liters per minute 2      Home Oxygen    Home Oxygen  Device Home Concentrator;E-Tanks;Portable Concentrator    Sleep Oxygen  Prescription Continuous    Liters per minute 2    Home Exercise Oxygen  Prescription Continuous    Liters per minute 3    Home Resting Oxygen  Prescription Continuous    Liters per minute 2    Compliance with Home Oxygen  Use Yes       Goals/Expected Outcomes   Short Term Goals To learn and demonstrate proper pursed lip breathing techniques or other breathing techniques.     Long  Term Goals Exhibits proper breathing techniques, such as pursed lip breathing or other method taught during program session    Comments Informed patient how to perform the Pursed Lipped breathing technique. Told patient to Inhale through the nose and out the mouth with pursed lips to keep their airways open, help oxygenate them better, practice when at rest or doing strenuous activity. Patient Verbalizes understanding of technique and will work on and be reiterated during [facility].    Goals/Expected Outcomes Short: use PLB with exertion. Long: use PLB on exertion proficiently and independently.          Initial Exercise Prescription:  Initial Exercise Prescription - 01/17/24 1100       Date of Initial Exercise RX and Referring Provider   Date 01/17/24    Referring Provider Dr. Elfreda Bathe, MD      Oxygen    Oxygen  Continuous    Liters 2-3    Maintain Oxygen  Saturation 88% or higher      Treadmill   MPH 1.8    Grade 0    Minutes 15    METs 2.38      Recumbant Bike   Level 1    RPM 50    Watts 15    Minutes 15    METs 1.58      NuStep   Level 2    SPM 80    Minutes 15    METs 1.58      Prescription Details   Frequency (times per week) 2    Duration Progress to 30 minutes of continuous aerobic without signs/symptoms of physical distress      Intensity   THRR 40-80% of Max Heartrate 94-126    Ratings of Perceived Exertion 11-13    Perceived Dyspnea 0-4      Progression   Progression Continue to progress  workloads to maintain intensity without signs/symptoms of physical distress.      Resistance Training   Training Prescription Yes    Weight 3 lb    Reps 10-15          Perform Capillary Blood Glucose checks as needed.  Exercise Prescription Changes:   Exercise Prescription Changes     Row Name 01/17/24 1100  01/30/24 1500 02/13/24 1500 02/28/24 1100 03/11/24 1100     Response to Exercise   Blood Pressure (Admit) 120/60 108/58 108/62 104/60 104/60   Blood Pressure (Exercise) 132/64 122/64 132/58 132/52 --   Blood Pressure (Exit) 114/58 116/58 104/56 108/64 104/72   Heart Rate (Admit) 62 bpm 73 bpm 68 bpm 74 bpm 65 bpm   Heart Rate (Exercise) 84 bpm 80 bpm 85 bpm 85 bpm 93 bpm   Heart Rate (Exit) 63 bpm 76 bpm 69 bpm 66 bpm 75 bpm   Oxygen  Saturation (Admit) 93 % 92 % 93 % 89 % 93 %   Oxygen  Saturation (Exercise) 87 % 88 % 88 % 88 % 89 %   Oxygen  Saturation (Exit) 94 % 89 % 91 % 94 % 90 %   Rating of Perceived Exertion (Exercise) 13 13 15 14 15    Perceived Dyspnea (Exercise) 2 -- 4 2 2    Symptoms R leg weakness none none none none   Comments Results first 2 weeks of exercise -- -- --   Duration -- Progress to 30 minutes of  aerobic without signs/symptoms of physical distress Progress to 30 minutes of  aerobic without signs/symptoms of physical distress Continue with 30 min of aerobic exercise without signs/symptoms of physical distress. Continue with 30 min of aerobic exercise without signs/symptoms of physical distress.   Intensity -- THRR unchanged THRR unchanged THRR unchanged THRR unchanged     Progression   Progression -- Continue to progress workloads to maintain intensity without signs/symptoms of physical distress. Continue to progress workloads to maintain intensity without signs/symptoms of physical distress. Continue to progress workloads to maintain intensity without signs/symptoms of physical distress. Continue to progress workloads to maintain intensity without signs/symptoms of physical distress.   Average METs -- 2.44 2.4 2.69 2.52     Resistance Training   Training Prescription -- Yes Yes Yes Yes   Weight -- 3 lb 3 lb 3 lb 3 lb   Reps -- 10-15 10-15 10-15 10-15     Interval Training   Interval Training -- No No No No     Oxygen    Oxygen  -- Continuous Continuous  Continuous Continuous   Liters -- 2-3 2-3 2-3 2-3L     Treadmill   MPH -- 1.8 1.8 1.8 2   Grade -- 0 0 1.5 0   Minutes -- 15 15 15 15    METs -- 2.38 2.38 2.75 2.53     Recumbant Bike   Level -- -- 1.1 2.5 2.6   Watts -- -- 15 15 15    Minutes -- -- 15 15 15    METs -- -- 2.55 2.54 2.55     NuStep   Level -- 2 3 4 6    Minutes -- 15 15 15 15    METs -- 2.5 2.6 3.1 2     Oxygen    Maintain Oxygen  Saturation -- 88% or higher 88% or higher 88% or higher --    Row Name 03/24/24 1100             Home Exercise Plan   Plans to continue  exercise at [facility] plans to continue exercising at the [employer] for home exercise. She currently goes once a week and will increase to three times a week after graduation.       Frequency Add 3 additional days to program exercise sessions.       Initial Home Exercises Provided 03/24/24          Exercise Comments:   Exercise Comments     Row Name 01/23/24 1133           Exercise Comments First full day of exercise!  Patient was oriented to gym and equipment including functions, settings, policies, and procedures.  Patient's individual exercise prescription and treatment plan were reviewed.  All starting workloads were established based on the results of the 6 minute walk test done at initial orientation visit.  The plan for exercise progression was also introduced and progression will be customized based on patient's performance and goals.          Exercise Goals and Review:   Exercise Goals     Row Name 01/17/24 1150             Exercise Goals   Increase Physical Activity Yes       Intervention Provide advice, education, support and counseling about physical activity/exercise needs.;Develop an individualized exercise prescription for aerobic and resistive training based on initial evaluation findings, risk stratification, comorbidities and participant's personal goals.       Expected Outcomes Short Term: Attend rehab  on a regular basis to increase amount of physical activity.;Long Term: Add in home exercise to make exercise part of routine and to increase amount of physical activity.;Long Term: Exercising regularly at least 3-5 days a week.       Increase Strength and Stamina Yes       Intervention Provide advice, education, support and counseling about physical activity/exercise needs.;Develop an individualized exercise prescription for aerobic and resistive training based on initial evaluation findings, risk stratification, comorbidities and participant's personal goals.       Expected Outcomes Short Term: Increase workloads from initial exercise prescription for resistance, speed, and METs.;Short Term: Perform resistance training exercises routinely during rehab and add in resistance training at home;Long Term: Improve cardiorespiratory fitness, muscular endurance and strength as measured by increased METs and functional capacity ( )       Able to understand and use rate of perceived exertion (RPE) scale Yes       Intervention Provide education and explanation on how to use RPE scale       Expected Outcomes Short Term: Able to use RPE daily in rehab to express subjective intensity level;Long Term:  Able to use RPE to guide intensity level when exercising independently       Able to understand and use Dyspnea scale Yes       Intervention Provide education and explanation on how to use Dyspnea scale       Expected Outcomes Short Term: Able to use Dyspnea scale daily in rehab to express subjective sense of shortness of breath during exertion;Long Term: Able to use Dyspnea scale to guide intensity level when exercising independently       Knowledge and understanding of Target Heart Rate Range (THRR) Yes       Intervention Provide education and explanation of THRR including how the numbers were predicted and where they are located for reference       Expected Outcomes Short Term: Able to state/look up THRR;Long  Term:  Able to use THRR to govern intensity when exercising independently;Short Term: Able to use daily as guideline for intensity in rehab       Able to check pulse independently Yes       Intervention Provide education and demonstration on how to check pulse in carotid and radial arteries.;Review the importance of being able to check your own pulse for safety during independent exercise       Expected Outcomes Short Term: Able to explain why pulse checking is important during independent exercise;Long Term: Able to check pulse independently and accurately       Understanding of Exercise Prescription Yes       Intervention Provide education, explanation, and written materials on patient's individual exercise prescription       Expected Outcomes Short Term: Able to explain program exercise prescription;Long Term: Able to explain home exercise prescription to exercise independently          Exercise Goals Re-Evaluation :  Exercise Goals Re-Evaluation     Row Name 01/23/24 1135 01/30/24 1556 02/13/24 1518 02/28/24 1127 03/11/24 1127     Exercise Goal Re-Evaluation   Exercise Goals Review Able to understand and use rate of perceived exertion (RPE) scale;Able to understand and use Dyspnea scale;Knowledge and understanding of Target Heart Rate Range (THRR);Understanding of Exercise Prescription Increase Physical Activity;Increase Strength and Stamina;Understanding of Exercise Prescription Increase Physical Activity;Increase Strength and Stamina;Understanding of Exercise Prescription Increase Physical Activity;Increase Strength and Stamina;Understanding of Exercise Prescription Increase Physical Activity;Increase Strength and Stamina;Understanding of Exercise Prescription   Comments Reviewed RPE and dyspnea scale, THR and program prescription with pt today.  Pt voiced understanding and was given a copy of goals to take home. Mychal is off to a good start in the program. She was able to complete her first  session during this review period. During her session she used the treadmill at a speed of 1.38mph and no incline, and the T4 nustep at level 2. We will continue to monitor her progress in the program. Faylynn is doing well in rehab. She has recently been able to increase from level 2 to 3 on the T4 nustep. She was also able to increase her speed on the treadmill to 1.75mph. We will contiue to monitor her progress in the program. Lorien continues to do well in rehab. She increased her treadmill workload by increasing her incline to 1.5% with a speed of 1.8 mph. She also improved to level 4 on the T4 nustep and level 2.5 on the recumbent bike. We will continue to monitor her progress in the program. Yaretzy is doing well in rehab. She was recently able to increase from level 4 to 6 on the T4 nustep. She was also able to use the treadmill at a speed of and no incline. We will continue to monitor her progress in the program.   Expected Outcomes Short: Use RPE daily to regulate intensity. Long: Follow program prescription in THR. Short: Continue to follow exercise prescription. Long: Continue exercise to improve strength and stamina. Short: Continue to follow exercise prescription. Long: Continue exercise to improve strength and stamina. Short: Continue to progressively increase workloads. Long: Continue exercise to improve strength and stamina. Short: Continue to progressively increase workloads. Long: Continue exercise to improve strength and stamina.    Row Name 03/24/24 1139             Exercise Goal Re-Evaluation   Exercise Goals Review Increase Physical Activity;Able to understand and use Dyspnea scale;Understanding of Exercise  Prescription;Knowledge and understanding of Target Heart Rate Range (THRR);Increase Strength and Stamina;Able to check pulse independently;Able to understand and use rate of perceived exertion (RPE) scale       Comments Reviewed home exercise with pt today from 11:25 to 11:40.  Pt  plans to continue [facility].  Reviewed THR, pulse, RPE, sign and symptoms, pulse oximetery and when to call 911 or MD.  Also discussed weather considerations and indoor options.  Pt voiced understanding.       Expected Outcomes Short: Continue to exercise at home. Long: Continue exercise to improve strength and stamina.          Discharge Exercise Prescription (Final Exercise Prescription Changes):  Exercise Prescription Changes - 03/24/24 1100       Home Exercise Plan   Plans to continue exercise at [facility] plans to continue exercising [facility] the [facility] for [facility] exercise. She currently goes once a week and will increase to three times a week after graduation.   Frequency Add 3 additional days to program exercise sessions.    Initial Home Exercises Provided 03/24/24          Nutrition:  Target Goals: Understanding of nutrition guidelines, daily intake of sodium 1500mg , cholesterol 200mg , calories 30% from fat and 7% or less from saturated fats, daily to have 5 or more servings of fruits and vegetables.  Education: All About Nutrition: -Group instruction provided by verbal, written material, interactive activities, discussions, models, and posters to present general guidelines for heart healthy nutrition including fat, fiber, MyPlate, the role of sodium in heart healthy nutrition, utilization of the nutrition label, and utilization of this knowledge for meal planning. Follow up email sent as well. Written material given at graduation. Flowsheet Row Pulmonary Rehab from 03/05/2024 in [facility] Cardiac and Pulmonary Rehab  Education need identified 01/17/24  Date 01/30/24  Educator jg part 1  Instruction Review Code 1- Verbalizes Understanding    Biometrics:  Pre Biometrics - 01/17/24 1151       Pre Biometrics   Height 5' 4.25 (1.632 m)    Weight 188 lb 14.4 oz (85.7 kg)    Waist Circumference 40 inches    Hip Circumference 45 inches    Waist  to Hip Ratio 0.89 %    BMI (Calculated) 32.17    Single Leg Stand 1.6 seconds           Nutrition Therapy Plan and Nutrition Goals:  Nutrition Therapy & Goals - 01/17/24 1124       Nutrition Therapy   Diet carb controlled, Cardiac Low Na    Protein (specify units) 90    Fiber 25 grams    Whole Grain Foods 3 servings    Saturated Fats 15 max. grams    Fruits and Vegetables 5 servings/day    Sodium 2 grams      Personal Nutrition Goals   Nutrition Goal Eat 15-30gProtein and 30-60gCarbs at each meal.    Personal Goal #2 Read labels and reduce sodium intake to below 2300mg . Ideally 1500mg  per day.    Comments Patient drinking mostly water, getting ~40-64oz daily. She says she usually eats 3 meals per day. Reviewed her food recall, educated on importance of pairing quality carbs with protein and healthy fat. She already monitors her sodium intake, says she doesn't cook or use it at the table. Encouraged her to read labels and try and keep sodium below 1500mg . Provided Mediterranean diet handout. Educated on  types of fats, sources, and how to read labels. Brainstormed several meals and snack ideas and went over sample plate handout.      Intervention Plan   Intervention Prescribe, educate and counsel regarding individualized specific dietary modifications aiming towards targeted core components such as weight, hypertension, lipid management, diabetes, heart failure and other comorbidities.;Nutrition handout(s) given to patient.    Expected Outcomes Short Term Goal: Understand basic principles of dietary content, such as calories, fat, sodium, cholesterol and nutrients.;Short Term Goal: A plan has been developed with personal nutrition goals set during dietitian appointment.;Long Term Goal: Adherence to prescribed nutrition plan.          Nutrition Assessments:  MEDIFICTS Score Key: >=70 Need to make dietary changes  40-70 Heart Healthy Diet <= 40 Therapeutic Level Cholesterol  Diet  Flowsheet Row Pulmonary Rehab from 01/17/2024 in [facility] Cardiac and Pulmonary Rehab  Picture Your Plate Total Score on Admission 58   Picture Your Plate Scores: <59 Unhealthy dietary pattern with much room for improvement. 41-50 Dietary pattern unlikely to meet recommendations for good health and room for improvement. 51-60 More healthful dietary pattern, with some room for improvement.  >60 Healthy dietary pattern, although there may be some specific behaviors that could be improved.   Nutrition Goals Re-Evaluation:  Nutrition Goals Re-Evaluation     Row Name 03/17/24 1133             Goals   Comment Patient was informed on why it is important to maintain a balanced diet when dealing with Respiratory issues. Explained that it takes a lot of energy to breath and when they are short of breath often they will need to have a good diet to help keep up with the calories they are expending for breathing.       Expected Outcome Short: Choose and plan snacks accordingly to patients caloric intake to improve breathing. Long: Maintain a diet independently that meets their caloric intake to aid in daily shortness of breath.          Nutrition Goals Discharge (Final Nutrition Goals Re-Evaluation):  Nutrition Goals Re-Evaluation - 03/17/24 1133       Goals   Comment Patient was informed on why it is important to maintain a balanced diet when dealing with Respiratory issues. Explained that it takes a lot of energy to breath and when they are short of breath often they will need to have a good diet to help keep up with the calories they are expending for breathing.    Expected Outcome Short: Choose and plan snacks accordingly to patients caloric intake to improve breathing. Long: Maintain a diet independently that meets their caloric intake to aid in daily shortness of breath.          Psychosocial: Target Goals: Acknowledge presence or absence of significant depression and/or stress,  maximize coping skills, provide positive support system. Participant is able to verbalize types and ability to use techniques and skills needed for reducing stress and depression.   Education: Stress, Anxiety, and Depression - Group verbal and visual presentation to define topics covered.  Reviews how body is impacted by stress, anxiety, and depression.  Also discusses healthy ways to reduce stress and to treat/manage anxiety and depression.  Written material given at graduation. Flowsheet Row Pulmonary Rehab from 08/23/2022 in [facility] Cardiac and Pulmonary Rehab  Date 06/21/22  Educator [facility]  Instruction Review Code 1- [employer] Understanding    Education: Sleep Hygiene -Provides group verbal and written instruction about  how sleep can affect your health.  Define sleep hygiene, discuss sleep cycles and impact of sleep habits. Review good sleep hygiene tips.    Initial Review & Psychosocial Screening:  Initial Psych Review & Screening - 01/15/24 1309       Initial Review   Current issues with Current Depression      Family Dynamics   Good Support System? Yes   friends     Barriers   Psychosocial barriers to participate in program The patient should benefit from training in stress management and relaxation.;There are no identifiable barriers or psychosocial needs.      Screening Interventions   Interventions Encouraged to exercise;To provide support and resources with identified psychosocial needs    Expected Outcomes Short Term goal: Utilizing psychosocial counselor, staff and physician to assist with identification of specific Stressors or current issues interfering with healing process. Setting desired goal for each stressor or current issue identified.;Long Term Goal: Stressors or current issues are controlled or eliminated.;Short Term goal: Identification and review with participant of any Quality of Life or Depression concerns found by scoring the questionnaire.;Long Term goal: The  participant improves quality of Life and PHQ9 Scores as seen by post scores and/or verbalization of changes          Quality of Life Scores:  Scores of 19 and below usually indicate a poorer quality of life in these areas.  A difference of  2-3 points is a clinically meaningful difference.  A difference of 2-3 points in the total score of the Quality of Life Index has been associated with significant improvement in overall quality of life, self-image, physical symptoms, and general health in studies assessing change in quality of life.  PHQ-9: Review Flowsheet  More data exists      03/06/2024 01/17/2024 11/01/2023 06/07/2023 02/12/2023  Depression screen PHQ 2/9  Decreased Interest 0 0 0 0 0  Down, Depressed, Hopeless 0 1 0 0 0  PHQ - 2 Score 0 1 0 0 0  Altered sleeping - 0 - - -  Tired, decreased energy - 2 - - -  Change in appetite - 0 - - -  Feeling bad or failure about yourself  - 0 - - -  Trouble concentrating - 0 - - -  Moving slowly or fidgety/restless - 0 - - -  Suicidal thoughts - 0 - - -  PHQ-9 Score - 3 - - -  Difficult doing work/chores - Not difficult at all - - -   Interpretation of Total Score  Total Score Depression Severity:  1-4 = Minimal depression, 5-9 = Mild depression, 10-14 = Moderate depression, 15-19 = Moderately severe depression, 20-27 = Severe depression   Psychosocial Evaluation and Intervention:  Psychosocial Evaluation - 01/15/24 1324       Psychosocial Evaluation & Interventions   Comments Bebe is returning to pulmonary rehab with pulmonary hypertension. She states she has been working on regulating her medications and prioritizing fluid balance. She wants to attend to work on her stamina and her breathing. She has a history of depression and mentions that its a daily choice on how she is going to feel. She does have friends that help her as well as a monthly call from a support system through her insurance.    Expected Outcomes Short: attend  pulmonary rehab for education and exercise Long: develop and maintain positive self care habits.    Continue Psychosocial Services  Follow up required by staff  Psychosocial Re-Evaluation:  Psychosocial Re-Evaluation     Row Name 03/17/24 1137             Psychosocial Re-Evaluation   Current issues with None Identified       Comments Patient reports no issues with their current mental states, sleep, stress, depression or anxiety. Will follow up with patient in a few weeks for any changes.       Expected Outcomes Short: Continue to exercise regularly to support mental health and notify staff of any changes. Long: maintain mental health and well being through teaching of rehab or prescribed medications independently.       Interventions Encouraged to attend Pulmonary Rehabilitation for the exercise       Continue Psychosocial Services  Follow up required by staff          Psychosocial Discharge (Final Psychosocial Re-Evaluation):  Psychosocial Re-Evaluation - 03/17/24 1137       Psychosocial Re-Evaluation   Current issues with None Identified    Comments Patient reports no issues with their current mental states, sleep, stress, depression or anxiety. Will follow up with patient in a few weeks for any changes.    Expected Outcomes Short: Continue to exercise regularly to support mental health and notify staff of any changes. Long: maintain mental health and well being through teaching of rehab or prescribed medications independently.    Interventions Encouraged to attend Pulmonary Rehabilitation for the exercise    Continue Psychosocial Services  Follow up required by staff          Education: Education Goals: Education classes will be provided on a weekly basis, covering required topics. Participant will state understanding/return demonstration of topics presented.  Learning Barriers/Preferences:   General Pulmonary Education Topics:  Infection Prevention: -  Provides verbal and written material to individual with discussion of infection control including proper hand washing and proper equipment cleaning during exercise session. Flowsheet Row Pulmonary Rehab from 03/05/2024 in [facility] Cardiac and Pulmonary Rehab  Date 01/17/24  Educator NT  Instruction Review Code 1- Verbalizes Understanding    Falls Prevention: - Provides verbal and written material to individual with discussion of falls prevention and safety. Flowsheet Row Pulmonary Rehab from 03/05/2024 in [facility] Cardiac and Pulmonary Rehab  Date 01/17/24  Educator NT  Instruction Review Code 1- Verbalizes Understanding    Chronic Lung Disease Review: - Group verbal instruction with posters, models, PowerPoint presentations and videos,  to review new updates, new respiratory medications, new advancements in procedures and treatments. Providing information on websites and 800 numbers for continued self-education. Includes information about supplement oxygen , available portable oxygen  systems, continuous and intermittent flow rates, oxygen  safety, concentrators, and Medicare reimbursement for oxygen . Explanation of Pulmonary Drugs, including class, frequency, complications, importance of spacers, rinsing mouth after steroid MDI's, and proper cleaning methods for nebulizers. Review of basic lung anatomy and physiology related to function, structure, and complications of lung disease. Review of risk factors. Discussion about methods for diagnosing sleep apnea and types of masks and machines for OSA. Includes a review of the use of types of environmental controls: home humidity, furnaces, filters, dust mite/pet prevention, HEPA vacuums. Discussion about weather changes, air quality and the benefits of nasal washing. Instruction on Warning signs, infection symptoms, calling MD promptly, preventive modes, and value of vaccinations. Review of effective airway clearance, coughing and/or vibration techniques.  Emphasizing that all should Create an Action Plan. Written material given at graduation. Flowsheet Row Pulmonary Rehab from 03/05/2024 in [facility] Cardiac and Pulmonary  Rehab  Education need identified 01/17/24  Date 03/05/24  Educator jh  Instruction Review Code 1- Verbalizes Understanding    AED/CPR: - Group verbal and written instruction with the use of models to demonstrate the basic use of the AED with the basic ABC's of resuscitation.    Anatomy and Cardiac Procedures: - Group verbal and visual presentation and models provide information about basic cardiac anatomy and function. Reviews the testing methods done to diagnose heart disease and the outcomes of the test results. Describes the treatment choices: Medical Management, Angioplasty, or Coronary Bypass Surgery for treating various heart conditions including Myocardial Infarction, Angina, Valve Disease, and Cardiac Arrhythmias.  Written material given at graduation. Flowsheet Row Pulmonary Rehab from 03/05/2024 in [facility] Cardiac and Pulmonary Rehab  Date 02/13/24  Educator [facility]  Instruction Review Code 1- Verbalizes Understanding    Medication Safety: - Group verbal and visual instruction to review commonly prescribed medications for heart and lung disease. Reviews the medication, class of the drug, and side effects. Includes the steps to properly store meds and maintain the prescription regimen.  Written material given at graduation. Flowsheet Row Pulmonary Rehab from 03/05/2024 in [facility] Cardiac and Pulmonary Rehab  Date 02/20/24  Educator sb  Instruction Review Code 1- Verbalizes Understanding    Other: -Provides group and verbal instruction on various topics (see comments)   Knowledge Questionnaire Score:  Knowledge Questionnaire Score - 01/17/24 1147       Knowledge Questionnaire Score   Pre Score 13/18           Core Components/Risk Factors/Patient Goals at Admission:  Personal Goals and Risk Factors at Admission -  01/15/24 1307       Core Components/Risk Factors/Patient Goals on Admission    Weight Management Yes;Weight Loss   fluid balance   Intervention Weight Management: Develop a combined nutrition and exercise program designed to reach desired caloric intake, while maintaining appropriate intake of nutrient and fiber, sodium and fats, and appropriate energy expenditure required for the weight goal.;Weight Management: Provide education and appropriate resources to help participant work on and attain dietary goals.;Weight Management/Obesity: Establish reasonable short term and long term weight goals.;Obesity: Provide education and appropriate resources to help participant work on and attain dietary goals.    Expected Outcomes Short Term: Continue to assess and modify interventions until short term weight is achieved;Long Term: Adherence to nutrition and physical activity/exercise program aimed toward attainment of established weight goal;Weight Loss: Understanding of general recommendations for a balanced deficit meal plan, which promotes 1-2 lb weight loss per week and includes a negative energy balance of [phone] kcal/d;Understanding recommendations for meals to include 15-35% energy as protein, 25-35% energy from fat, 35-60% energy from carbohydrates, less than 200mg  of dietary cholesterol, 20-35 gm of total fiber daily;Understanding of distribution of calorie intake throughout the day with the consumption of 4-5 meals/snacks    Improve shortness of breath with ADL's Yes    Intervention Provide education, individualized exercise plan and daily activity instruction to help decrease symptoms of SOB with activities of daily living.    Expected Outcomes Short Term: Improve cardiorespiratory fitness to achieve a reduction of symptoms when performing ADLs;Long Term: Be able to perform more ADLs without symptoms or delay the onset of symptoms    Diabetes Yes    Intervention Provide education about signs/symptoms  and action to take for hypo/hyperglycemia.;Provide education about proper nutrition, including hydration, and aerobic/resistive exercise prescription along with prescribed medications to achieve blood glucose in normal ranges: Fasting  glucose 65-99 mg/dL    Expected Outcomes Short Term: Participant verbalizes understanding of the signs/symptoms and immediate care of hyper/hypoglycemia, proper foot care and importance of medication, aerobic/resistive exercise and nutrition plan for blood glucose control.;Long Term: Attainment of HbA1C < 7%.    Heart Failure Yes    Intervention Provide a combined exercise and nutrition program that is supplemented with education, support and counseling about heart failure. Directed toward relieving symptoms such as shortness of breath, decreased exercise tolerance, and extremity edema.    Expected Outcomes Improve functional capacity of life;Short term: Attendance in program 2-3 days a week with increased exercise capacity. Reported lower sodium intake. Reported increased fruit and vegetable intake. Reports medication compliance.;Short term: Daily weights obtained and reported for increase. Utilizing diuretic protocols set by physician.;Long term: Adoption of self-care skills and reduction of barriers for early signs and symptoms recognition and intervention leading to self-care maintenance.          Education:Diabetes - Individual verbal and written instruction to review signs/symptoms of diabetes, desired ranges of glucose level fasting, after meals and with exercise. Acknowledge that pre and post exercise glucose checks will be done for 3 sessions at entry of program. Flowsheet Row Pulmonary Rehab from 08/23/2022 in [facility] Cardiac and Pulmonary Rehab  Date 05/15/22  Educator [facility]  Instruction Review Code 1- Verbalizes Understanding    Know Your Numbers and Heart Failure: - Group verbal and visual instruction to discuss disease risk factors for cardiac and pulmonary  disease and treatment options.  Reviews associated critical values for Overweight/Obesity, Hypertension, Cholesterol, and Diabetes.  Discusses basics of heart failure: signs/symptoms and treatments.  Introduces Heart Failure Zone chart for action plan for heart failure.  Written material given at graduation. Flowsheet Row Pulmonary Rehab from 03/05/2024 in [facility] Cardiac and Pulmonary Rehab  Date 02/27/24  Educator sb  Instruction Review Code 1- Verbalizes Understanding    Core Components/Risk Factors/Patient Goals Review:   Goals and Risk Factor Review     Row Name 03/17/24 1133             Core Components/Risk Factors/Patient Goals Review   Personal Goals Review Improve shortness of breath with ADL's       Review Spoke to patient about their shortness of breath and what they can do to improve. Patient has been informed of breathing techniques when starting the program. Patient is informed to tell staff if they have had any med changes and that certain meds they are taking or not taking can be causing shortness of breath.       Expected Outcomes Short: Attend [facility] regularly to improve shortness of breath with ADL's. Long: maintain independence with ADL's          Core Components/Risk Factors/Patient Goals at Discharge (Final Review):   Goals and Risk Factor Review - 03/17/24 1133       Core Components/Risk Factors/Patient Goals Review   Personal Goals Review Improve shortness of breath with ADL's    Review Spoke to patient about their shortness of breath and what they can do to improve. Patient has been informed of breathing techniques when starting the program. Patient is informed to tell staff if they have had any med changes and that certain meds they are taking or not taking can be causing shortness of breath.    Expected Outcomes Short: Attend [facility] regularly to improve shortness of breath with ADL's. Long: maintain independence with ADL's          ITP Comments:  ITP  Comments     Row Name 01/15/24 1320 01/17/24 1145 01/23/24 1133 01/30/24 1050 02/27/24 1055   ITP Comments Initial phone call completed. Diagnosis can be found in [facility] 5/12. EP Orientation scheduled for Thursday 5/22 at 9:30am. Completed and gym orientation for respiratory care services. Initial ITP created and sent for review to Dr. Faud Aleskerov, Medical Director. First full day of exercise!  Patient was oriented to gym and equipment including functions, settings, policies, and procedures.  Patient's individual exercise prescription and treatment plan were reviewed.  All starting workloads were established based on the results of the 6 minute walk test done at initial orientation visit.  The plan for exercise progression was also introduced and progression will be customized based on patient's performance and goals. 30 Day review completed. Medical Director ITP review done, changes made as directed, and signed approval by Medical Director.    new to program 30 Day review completed. Medical Director ITP review done, changes made as directed, and signed approval by Medical Director.    Row Name 03/26/24 1005           ITP Comments 30 Day review completed. Medical Director ITP review done, changes made as directed, and signed approval by Medical Director.          Comments: 30 day review

## 2024-03-31 ENCOUNTER — Encounter: Attending: Internal Medicine

## 2024-03-31 DIAGNOSIS — J449 Chronic obstructive pulmonary disease, unspecified: Secondary | ICD-10-CM | POA: Insufficient documentation

## 2024-03-31 DIAGNOSIS — I2721 Secondary pulmonary arterial hypertension: Secondary | ICD-10-CM | POA: Diagnosis not present

## 2024-03-31 NOTE — Progress Notes (Signed)
 Daily Session Note  Patient Details  Name: Caitlan Chauca MRN: [id number] Date of Birth: 11-Mar-1946 Referring Provider:   Flowsheet Row Pulmonary Rehab from 01/17/2024 in [facility] Cardiac and Pulmonary Rehab  Referring Provider Dr. Elfreda Bathe, MD    Encounter Date: 03/31/2024  Check In:  Session Check In - 03/31/24 1105       Check-In   Supervising physician immediately available to respond to emergencies See telemetry face sheet for immediately available ER MD    Location [facility]-Cardiac & Pulmonary Rehab    Staff Present Burnard Davenport RN,BSN,MPA;Joseph [facility] Dyane BS, ACSM CEP, Exercise Physiologist;Laureen Delores, BS, RRT, CPFT    Virtual Visit No    Medication changes reported     No    Fall or balance concerns reported    No    Tobacco Cessation No Change    Warm-up and Cool-down Performed on first and last piece of equipment    Resistance Training Performed Yes    VAD Patient? No    PAD/SET Patient? No      Pain Assessment   Currently in Pain? No/denies             Social History   Tobacco Use  Smoking Status Former   Current packs/day: 0.00   Average packs/day: 1 pack/day for 50.0 years (50.0 ttl pk-yrs)   Types: Cigarettes   Start date: 12/29/1971   Quit date: 12/28/2021   Years since quitting: 2.2   Passive exposure: Never  Smokeless Tobacco Never    Goals Met:  Independence with exercise equipment Exercise tolerated well No report of concerns or symptoms today Strength training completed today  Goals Unmet:  Not Applicable  Comments: Pt able to follow exercise prescription today without complaint.  Will continue to monitor for progression.    Dr. Oneil Pinal is Medical Director for [facility] Cardiac Rehabilitation.  Dr. Fuad Aleskerov is Medical Director for [facility] Pulmonary Rehabilitation.

## 2024-04-02 ENCOUNTER — Encounter

## 2024-04-02 DIAGNOSIS — J449 Chronic obstructive pulmonary disease, unspecified: Secondary | ICD-10-CM | POA: Diagnosis not present

## 2024-04-02 DIAGNOSIS — I2721 Secondary pulmonary arterial hypertension: Secondary | ICD-10-CM

## 2024-04-02 NOTE — Progress Notes (Signed)
 Daily Session Note  Patient Details  Name: Amber Holder MRN: [id number] Date of Birth: 1946/03/07 Referring Provider:   Flowsheet Row Pulmonary Rehab from 01/17/2024 in [facility] Cardiac and Pulmonary Rehab  Referring Provider Dr. Elfreda Bathe, MD    Encounter Date: 04/02/2024  Check In:  Session Check In - 04/02/24 1100       Check-In   Supervising physician immediately available to respond to emergencies See telemetry face sheet for immediately available ER MD    Location [facility]-Cardiac & Pulmonary Rehab    Staff Present Burnard Davenport RN,BSN,MPA;Joseph [facility] RCP,RRT,BSRT;Margaret Best, MS, Exercise Physiologist;Jason Elnor RDN,LDN    Virtual Visit No    Medication changes reported     No    Fall or balance concerns reported    No    Tobacco Cessation No Change    Warm-up and Cool-down Performed on first and last piece of equipment    Resistance Training Performed Yes    VAD Patient? No    PAD/SET Patient? No      Pain Assessment   Currently in Pain? No/denies             Social History   Tobacco Use  Smoking Status Former   Current packs/day: 0.00   Average packs/day: 1 pack/day for 50.0 years (50.0 ttl pk-yrs)   Types: Cigarettes   Start date: 12/29/1971   Quit date: 12/28/2021   Years since quitting: 2.2   Passive exposure: Never  Smokeless Tobacco Never    Goals Met:  Proper associated with RPD/PD & O2 Sat Independence with exercise equipment Using PLB without cueing & demonstrates good technique Exercise tolerated well No report of concerns or symptoms today Strength training completed today  Goals Unmet:  Not Applicable  Comments: Pt able to follow exercise prescription today without complaint.  Will continue to monitor for progression.    Dr. Oneil Pinal is Medical Director for [facility] Cardiac Rehabilitation.  Dr. Fuad Aleskerov is Medical Director for [facility] Pulmonary Rehabilitation.

## 2024-04-07 ENCOUNTER — Encounter

## 2024-04-07 DIAGNOSIS — J449 Chronic obstructive pulmonary disease, unspecified: Secondary | ICD-10-CM | POA: Diagnosis not present

## 2024-04-07 DIAGNOSIS — I2721 Secondary pulmonary arterial hypertension: Secondary | ICD-10-CM | POA: Diagnosis not present

## 2024-04-07 NOTE — Progress Notes (Signed)
 Daily Session Note  Patient Details  Name: Amber Holder MRN: [id number] Date of Birth: [birth year] Referring Provider:   Flowsheet Row Pulmonary Rehab from 01/17/2024 in [facility] Cardiac and Pulmonary Rehab  Referring Provider Dr. Elfreda Bathe, MD    Encounter Date: 04/07/2024  Check In:  Session Check In - 04/07/24 1059       Check-In   Supervising physician immediately available to respond to emergencies See telemetry face sheet for immediately available ER MD    Location [facility]-Cardiac & Pulmonary Rehab    Staff Present Burnard Davenport RN,BSN,MPA;Maxon Burnell BS, Exercise Physiologist;Joseph [employer] BS, ACSM CEP, Exercise Physiologist    Virtual Visit No    Medication changes reported     No    Fall or balance concerns reported    No    Tobacco Cessation No Change    Resistance Training Performed Yes    VAD Patient? No    PAD/SET Patient? No      Pain Assessment   Currently in Pain? No/denies             Social History   Tobacco Use  Smoking Status Former   Current packs/day: 0.00   Average packs/day: 1 pack/day for 50.0 years (50.0 ttl pk-yrs)   Types: Cigarettes   Start date: 12/29/1971   Quit date: 12/28/2021   Years since quitting: 2.2   Passive exposure: Never  Smokeless Tobacco Never    Goals Met:  Proper associated with RPD/PD & O2 Sat Independence with exercise equipment Using PLB without cueing & demonstrates good technique Exercise tolerated well No report of concerns or symptoms today Strength training completed today  Goals Unmet:  Not Applicable  Comments: Pt able to follow exercise prescription today without complaint.  Will continue to monitor for progression.    Dr. Oneil Pinal is Medical Director for [facility] Cardiac Rehabilitation.  Dr. Fuad Aleskerov is Medical Director for [facility] Pulmonary Rehabilitation.

## 2024-04-09 ENCOUNTER — Encounter: Admitting: Emergency Medicine

## 2024-04-09 DIAGNOSIS — J449 Chronic obstructive pulmonary disease, unspecified: Secondary | ICD-10-CM

## 2024-04-09 DIAGNOSIS — I2721 Secondary pulmonary arterial hypertension: Secondary | ICD-10-CM | POA: Diagnosis not present

## 2024-04-09 NOTE — Progress Notes (Signed)
 Daily Session Note  Patient Details  Name: Amber Holder MRN: [id number] Date of Birth: August 19, 1946 Referring Provider:   Flowsheet Row Pulmonary Rehab from 01/17/2024 in [facility] Cardiac and Pulmonary Rehab  Referring Provider Dr. Elfreda Bathe, MD    Encounter Date: 04/09/2024  Check In:  Session Check In - 04/09/24 1110       Check-In   Supervising physician immediately available to respond to emergencies See telemetry face sheet for immediately available ER MD    Location [facility]-Cardiac & Pulmonary Rehab    Staff Present Burnard Davenport RN,BSN,MPA;Margaret Best, MS, Exercise Physiologist;Noah Tickle, BS, Exercise Physiologist;Joseph Rolinda RCP,RRT,BSRT    Virtual Visit No    Medication changes reported     No    Fall or balance concerns reported    No    Tobacco Cessation No Change    Warm-up and Cool-down Performed on first and last piece of equipment    Resistance Training Performed Yes    VAD Patient? No    PAD/SET Patient? No      Pain Assessment   Currently in Pain? No/denies             Social History   Tobacco Use  Smoking Status Former   Current packs/day: 0.00   Average packs/day: 1 pack/day for 50.0 years (50.0 ttl pk-yrs)   Types: Cigarettes   Start date: 12/29/1971   Quit date: 12/28/2021   Years since quitting: 2.2   Passive exposure: Never  Smokeless Tobacco Never    Goals Met:  Proper associated with RPD/PD & O2 Sat Independence with exercise equipment Using PLB without cueing & demonstrates good technique Exercise tolerated well No report of concerns or symptoms today Strength training completed today  Goals Unmet:  Not Applicable  Comments: Pt able to follow exercise prescription today without complaint.  Will continue to monitor for progression.    Dr. Oneil Pinal is Medical Director for [facility] Cardiac Rehabilitation.  Dr. Fuad Aleskerov is Medical Director for [facility] Pulmonary Rehabilitation.

## 2024-04-12 ENCOUNTER — Other Ambulatory Visit: Payer: Self-pay | Admitting: Physician Assistant

## 2024-04-12 DIAGNOSIS — E782 Mixed hyperlipidemia: Secondary | ICD-10-CM

## 2024-04-14 ENCOUNTER — Encounter

## 2024-04-14 DIAGNOSIS — J449 Chronic obstructive pulmonary disease, unspecified: Secondary | ICD-10-CM | POA: Diagnosis not present

## 2024-04-14 DIAGNOSIS — I2721 Secondary pulmonary arterial hypertension: Secondary | ICD-10-CM

## 2024-04-14 NOTE — Progress Notes (Signed)
 Daily Session Note  Patient Details  Name: Amber Holder MRN: [id number] Date of Birth: 03-Apr-1946 Referring Provider:   Flowsheet Row Pulmonary Rehab from 01/17/2024 in [facility] Cardiac and Pulmonary Rehab  Referring Provider Dr. Elfreda Bathe, MD    Encounter Date: 04/14/2024  Check In:  Session Check In - 04/14/24 1119       Check-In   Supervising physician immediately available to respond to emergencies See telemetry face sheet for immediately available ER MD    Location [facility]-Cardiac & Pulmonary Rehab    Staff Present Burnard Davenport RN,BSN,MPA;Meredith Tressa RN,BSN;Joseph Rolinda RCP,RRT,BSRT;Lauren Cates RN,BSN;Hedwig Mcfall Dyane BS, ACSM CEP, Exercise Physiologist    Virtual Visit No    Medication changes reported     No    Fall or balance concerns reported    No    Tobacco Cessation No Change    Warm-up and Cool-down Performed on first and last piece of equipment    Resistance Training Performed Yes    VAD Patient? No    PAD/SET Patient? No      Pain Assessment   Currently in Pain? No/denies             Social History   Tobacco Use  Smoking Status Former   Current packs/day: 0.00   Average packs/day: 1 pack/day for 50.0 years (50.0 ttl pk-yrs)   Types: Cigarettes   Start date: 12/29/1971   Quit date: 12/28/2021   Years since quitting: 2.2   Passive exposure: Never  Smokeless Tobacco Never    Goals Met:  Proper associated with RPD/PD & O2 Sat Independence with exercise equipment Using PLB without cueing & demonstrates good technique Exercise tolerated well No report of concerns or symptoms today Strength training completed today  Goals Unmet:  Not Applicable  Comments: Pt able to follow exercise prescription today without complaint.  Will continue to monitor for progression.    Dr. Oneil Pinal is Medical Director for [facility] Cardiac Rehabilitation.  Dr. Fuad Aleskerov is Medical Director for [facility] Pulmonary Rehabilitation.

## 2024-04-16 ENCOUNTER — Encounter

## 2024-04-16 DIAGNOSIS — I2721 Secondary pulmonary arterial hypertension: Secondary | ICD-10-CM

## 2024-04-16 DIAGNOSIS — J449 Chronic obstructive pulmonary disease, unspecified: Secondary | ICD-10-CM | POA: Diagnosis not present

## 2024-04-16 NOTE — Progress Notes (Signed)
 Daily Session Note  Patient Details  Name: Amber Holder MRN: [id number] Date of Birth: 20-Apr-1946 Referring Provider:   Flowsheet Row Pulmonary Rehab from 01/17/2024 in [facility] Cardiac and Pulmonary Rehab  Referring Provider Dr. Elfreda Bathe, MD    Encounter Date: 04/16/2024  Check In:  Session Check In - 04/16/24 1055       Check-In   Supervising physician immediately available to respond to emergencies See telemetry face sheet for immediately available ER MD    Location [facility]-Cardiac & Pulmonary Rehab    Staff Present Burnard Davenport RN,BSN,MPA;Joseph Hood RCP,RRT,BSRT;Noah Tickle, [place], Exercise Physiologist;Jason Elnor RDN,LDN;Laura Cates RN,BSN    Virtual Visit No    Medication changes reported     No    Fall or balance concerns reported    No    Tobacco Cessation No Change    Warm-up and Cool-down Performed on first and last piece of equipment    Resistance Training Performed Yes    VAD Patient? No    PAD/SET Patient? No      Pain Assessment   Currently in Pain? No/denies             Social History   Tobacco Use  Smoking Status Former   Current packs/day: 0.00   Average packs/day: 1 pack/day for 50.0 years (50.0 ttl pk-yrs)   Types: Cigarettes   Start date: 12/29/1971   Quit date: 12/28/2021   Years since quitting: 2.3   Passive exposure: Never  Smokeless Tobacco Never    Goals Met:  Proper associated with RPD/PD & O2 Sat Independence with exercise equipment Using PLB without cueing & demonstrates good technique Exercise tolerated well No report of concerns or symptoms today Strength training completed today  Goals Unmet:  Not Applicable  Comments: Pt able to follow exercise prescription today without complaint.  Will continue to monitor for progression.    Dr. Oneil Pinal is Medical Director for [facility] Cardiac Rehabilitation.  Dr. Fuad Aleskerov is Medical Director for [facility] Pulmonary Rehabilitation.

## 2024-04-21 ENCOUNTER — Encounter

## 2024-04-21 DIAGNOSIS — I2721 Secondary pulmonary arterial hypertension: Secondary | ICD-10-CM | POA: Diagnosis not present

## 2024-04-21 DIAGNOSIS — J449 Chronic obstructive pulmonary disease, unspecified: Secondary | ICD-10-CM | POA: Diagnosis not present

## 2024-04-21 NOTE — Progress Notes (Signed)
 Daily Session Note  Patient Details  Name: Amber Holder MRN: [id number] Date of Birth: 28-Mar-1946 Referring Provider:   Flowsheet Row Pulmonary Rehab from 01/17/2024 in [facility] Cardiac and Pulmonary Rehab  Referring Provider Dr. Elfreda Bathe, MD    Encounter Date: 04/21/2024  Check In:  Session Check In - 04/21/24 1056       Check-In   Supervising physician immediately available to respond to emergencies See telemetry face sheet for immediately available ER MD    Location [facility]-Cardiac & Pulmonary Rehab    Staff Present Burnard Davenport RN,BSN,MPA;Joseph Rolinda RCP,RRT,BSRT;Laura Cates RN,BSN;Eleyna Brugh Dyane BS, ACSM CEP, Exercise Physiologist    Virtual Visit No    Medication changes reported     No    Fall or balance concerns reported    No    Tobacco Cessation No Change    Warm-up and Cool-down Performed on first and last piece of equipment    Resistance Training Performed Yes    VAD Patient? No    PAD/SET Patient? No      Pain Assessment   Currently in Pain? No/denies             Social History   Tobacco Use  Smoking Status Former   Current packs/day: 0.00   Average packs/day: 1 pack/day for 50.0 years (50.0 ttl pk-yrs)   Types: Cigarettes   Start date: 12/29/1971   Quit date: 12/28/2021   Years since quitting: 2.3   Passive exposure: Never  Smokeless Tobacco Never    Goals Met:  Proper associated with RPD/PD & O2 Sat Independence with exercise equipment Using PLB without cueing & demonstrates good technique Exercise tolerated well No report of concerns or symptoms today Strength training completed today  Goals Unmet:  Not Applicable  Comments: Pt able to follow exercise prescription today without complaint.  Will continue to monitor for progression.    Dr. Oneil Pinal is Medical Director for [facility] Cardiac Rehabilitation.  Dr. Fuad Aleskerov is Medical Director for [facility] Pulmonary Rehabilitation.

## 2024-04-22 DIAGNOSIS — H6061 Unspecified chronic otitis externa, right ear: Secondary | ICD-10-CM | POA: Diagnosis not present

## 2024-04-22 DIAGNOSIS — H6123 Impacted cerumen, bilateral: Secondary | ICD-10-CM | POA: Diagnosis not present

## 2024-04-22 DIAGNOSIS — H903 Sensorineural hearing loss, bilateral: Secondary | ICD-10-CM | POA: Diagnosis not present

## 2024-04-23 ENCOUNTER — Encounter: Admitting: Emergency Medicine

## 2024-04-23 DIAGNOSIS — I2721 Secondary pulmonary arterial hypertension: Secondary | ICD-10-CM

## 2024-04-23 DIAGNOSIS — J449 Chronic obstructive pulmonary disease, unspecified: Secondary | ICD-10-CM | POA: Diagnosis not present

## 2024-04-23 NOTE — Progress Notes (Signed)
 Pulmonary Individual Treatment Plan  Patient Details  Name: Amber Holder MRN: [id number] Date of Birth: [birth year] Referring Provider:   Flowsheet Row Pulmonary Rehab from 01/17/2024 in [facility] Cardiac and Pulmonary Rehab  Referring Provider Dr. Elfreda Bathe, MD    Initial Encounter Date:  Flowsheet Row Pulmonary Rehab from 01/17/2024 in [facility] Cardiac and Pulmonary Rehab  Date 01/17/24    Visit Diagnosis: Pulmonary artery hypertension (HCC)  Chronic obstructive pulmonary disease, unspecified COPD type (HCC)  Patient's Home Medications on Admission:  Current Outpatient Medications:    acetaminophen  (TYLENOL ) 500 MG chewable tablet, Chew 1,000 mg by mouth every 8 (eight) hours as needed for pain., Disp: , Rfl:    albuterol  (VENTOLIN  HFA) 108 (90 Base) MCG/ACT inhaler, Inhale 2 puffs into the lungs every 6 (six) hours as needed for wheezing or shortness of breath., Disp: 18 g, Rfl: 2   aspirin  EC 81 MG tablet, Take 1 tablet (81 mg total) by mouth daily. Swallow whole., Disp: 90 tablet, Rfl: 3   cholecalciferol  (VITAMIN D ) 400 UNITS TABS tablet, Take 2,000 Units by mouth daily., Disp: , Rfl:    CRANBERRY PO, Take 25,000 mg by mouth daily., Disp: , Rfl:    diclofenac  Sodium (VOLTAREN ) 1 % GEL, Apply small amount to area of pain daily as needed, Disp: 2 g, Rfl: 0   ergocalciferol  (DRISDOL ) 1.25 MG (50000 UT) capsule, Take one cap q week, Disp: 12 capsule, Rfl: 3   estradiol  (ESTRACE ) 0.1 MG/GM vaginal cream, Estrogen Cream Instruction Discard applicator Apply pea sized amount to tip of finger to urethra before bed. Wash hands well after application. Use Monday, Wednesday and Friday, Disp: 42.5 g, Rfl: 12   ezetimibe  (ZETIA ) 10 MG tablet, Take 1 tablet (10 mg total) by mouth daily., Disp: 90 tablet, Rfl: 3   FARXIGA  10 MG TABS tablet, TAKE 1 TABLET BY MOUTH DAILY BEFORE BREAKFAST., Disp: 90 tablet, Rfl: 3   fluconazole  (DIFLUCAN ) 150 MG tablet, Take one tab po q week for fungal infection and as  needed, Disp: 12 tablet, Rfl: 1   Fluticasone -Umeclidin-Vilant (TRELEGY ELLIPTA ) 100-62.5-25 MCG/ACT AEPB, Use inhale  orally daily, Disp: 3 each, Rfl: 4   glucose blood (ONETOUCH VERIO) test strip, BLOOD SUGAR TESTING ONCE DAILY . DX E11.65, Disp: 100 strip, Rfl: 3   hydrocortisone  cream 0.5 %, Apply topically as needed., Disp: 30 g, Rfl: 0   ibandronate  (BONIVA ) 150 MG tablet, TAKE 1 TABLET (150 MG TOTAL) BY MOUTH EVERY 30 (THIRTY) DAYS., Disp: 3 tablet, Rfl: 3   ketoconazole (NIZORAL) 2 % cream, Apply 1 Application topically daily., Disp: , Rfl:    Lancets (ONETOUCH DELICA PLUS LANCET30G) MISC, Use  as directed twice a daily DX E11.65, Disp: 100 each, Rfl: 3   macitentan  (OPSUMIT ) 10 MG tablet, Take 1 tablet (10 mg total) by mouth daily., Disp: 90 tablet, Rfl: 5   Magnesium  250 MG TABS, Take by mouth. Takes 1 tablet 2-3 times per week, Disp: , Rfl:    montelukast  (SINGULAIR ) 10 MG tablet, Take 1 tablet (10 mg total) by mouth daily as needed., Disp: 90 tablet, Rfl: 1   Multiple Vitamins-Minerals (MULTIVITAMIN ADULTS PO), Take 1 tablet by mouth daily., Disp: , Rfl:    omeprazole  (PRILOSEC) 40 MG capsule, TAKE 1 CAPSULE (40 MG TOTAL) BY MOUTH DAILY., Disp: 90 capsule, Rfl: 3   OXYGEN , Inhale 3 L into the lungs. Pt uses [facility] for Oxygen , Disp: , Rfl:    potassium chloride  SA (KLOR-CON  M) 20 MEQ  tablet, Take 2 tablets (40 mEq total) by mouth daily., Disp: 180 tablet, Rfl: 3   spironolactone  (ALDACTONE ) 25 MG tablet, Take 1 tablet (25 mg total) by mouth daily., Disp: 90 tablet, Rfl: 3   Torsemide  40 MG TABS, Take 40 mg by mouth daily., Disp: 90 tablet, Rfl: 3  Current Facility-Administered Medications:    nitrofurantoin  (macrocrystal-monohydrate) (MACROBID ) capsule 100 mg, 100 mg, Oral, Q12H, McDonough, Tinnie POUR, PA-C  Past Medical History: Past Medical History:  Diagnosis Date   Anxiety 01/02/2012   Arthritis    Asthma    Blockage of coronary artery of heart (HCC)    Chronic  kidney disease    Kidney stones   Colon polyps    COPD (chronic obstructive pulmonary disease) (HCC)    Diabetes mellitus without complication (HCC)    GERD (gastroesophageal reflux disease)    HOH (hard of hearing)    Bilateral hearing aids   Kidney stone    Osteoporosis    Sleep apnea    Does not use C-PAP on a regular basis   Urinary tract infection    Venous stasis     Tobacco Use: Social History   Tobacco Use  Smoking Status Former   Current packs/day: 0.00   Average packs/day: 1 pack/day for 50.0 years (50.0 ttl pk-yrs)   Types: Cigarettes   Start date: 12/29/1971   Quit date: 12/28/2021   Years since quitting: 2.3   Passive exposure: Never  Smokeless Tobacco Never    Labs: Review Flowsheet  More data exists      Latest Ref Rng & Units 07/07/2022 09/05/2022 06/11/2023 11/01/2023 03/06/2024  Labs for ITP Cardiac and Pulmonary Rehab  Cholestrol 0 - 200 mg/dL 827  - 830  - -  LDL (calc) 0 - 99 mg/dL 893  - 96  - -  Direct LDL 0 - 99 mg/dL - - 899  - -  HDL-C >59 mg/dL 45  - 57  - -  Trlycerides <150 mg/dL 895  - 79  - -  Hemoglobin A1c 4.0 - 5.6 % - 6.3  6.6  6.4  6.2      Pulmonary Assessment Scores:  Pulmonary Assessment Scores     Row Name 01/17/24 1149         ADL UCSD   ADL Phase Entry     SOB Score total 64     Rest 1     Walk 3     Stairs 4     Bath 3     Dress 2     Shop 3       CAT Score   CAT Score 14       mMRC Score   mMRC Score 2        UCSD: Self-administered rating of dyspnea associated with activities of daily living (ADLs) 6-point scale (0 = not at all to 5 = maximal or unable to do because of breathlessness)  Scoring Scores range from 0 to 120.  Minimally important difference is 5 units  CAT: CAT can identify the health impairment of COPD patients and is better correlated with disease progression.  CAT has a scoring range of zero to 40. The CAT score is classified into four groups of low (less than 10), medium (10 - 20),  high (21-30) and very high (31-40) based on the impact level of disease on health status. A CAT score over 10 suggests significant symptoms.  A worsening CAT score could be explained by  an exacerbation, poor medication adherence, poor inhaler technique, or progression of COPD or comorbid conditions.  CAT MCID is 2 points  mMRC: mMRC (Modified Medical Research Council) Dyspnea Scale is used to assess the degree of baseline functional disability in patients of respiratory disease due to dyspnea. No minimal important difference is established. A decrease in score of 1 point or greater is considered a positive change.   Pulmonary Function Assessment:   Exercise Target Goals: Exercise Program Goal: Individual exercise prescription set using results from initial 6 min walk test and THRR while considering  patient's activity barriers and safety.   Exercise Prescription Goal: Initial exercise prescription builds to 30-45 minutes a day of aerobic activity, 2-3 days per week.  Home exercise guidelines will be given to patient during program as part of exercise prescription that the participant will acknowledge.  Education: Aerobic Exercise: - Group verbal and visual presentation on the components of exercise prescription. Introduces F.I.T.T principle from ACSM for exercise prescriptions.  Reviews F.I.T.T. principles of aerobic exercise including progression. Written material provided at class time. Flowsheet Row Pulmonary Rehab from 03/05/2024 in [facility] Cardiac and Pulmonary Rehab  Date 01/23/24  Educator NT  Instruction Review Code 1- Verbalizes Understanding    Education: Resistance Exercise: - Group verbal and visual presentation on the components of exercise prescription. Introduces F.I.T.T principle from ACSM for exercise prescriptions  Reviews F.I.T.T. principles of resistance exercise including progression. Written material provided at class time.    Education: Exercise & Equipment Safety: -  Individual verbal instruction and demonstration of equipment use and safety with use of the equipment. Flowsheet Row Pulmonary Rehab from 03/05/2024 in [facility] Cardiac and Pulmonary Rehab  Date 01/17/24  Educator NT  Instruction Review Code 1- Verbalizes Understanding    Education: Exercise Physiology & General Exercise Guidelines: - Group verbal and written instruction with models to review the exercise physiology of the cardiovascular system and associated critical values. Provides general exercise guidelines with specific guidelines to those with heart or lung disease.  Flowsheet Row Pulmonary Rehab from 08/23/2022 in [facility] Cardiac and Pulmonary Rehab  Date 06/28/22  Educator [facility]  Instruction Review Code 1- Verbalizes Understanding    Education: Flexibility, Balance, Mind/Body Relaxation: - Group verbal and visual presentation with interactive activity on the components of exercise prescription. Introduces F.I.T.T principle from ACSM for exercise prescriptions. Reviews F.I.T.T. principles of flexibility and balance exercise training including progression. Also discusses the mind body connection.  Reviews various relaxation techniques to help reduce and manage stress (i.e. Deep breathing, progressive muscle relaxation, and visualization). Balance handout provided to take home. Written material provided at class time.   Activity Barriers & Risk Stratification:  Activity Barriers & Cardiac Risk Stratification - 01/17/24 1150       Activity Barriers & Cardiac Risk Stratification   Activity Barriers Right Hip Replacement;Shortness of Breath;Muscular Weakness;Balance Concerns;Arthritis;Back Problems          6 Minute Walk:  6 Minute Walk     Row Name 01/17/24 1151         6 Minute Walk   Phase Initial     Distance 1050 feet     Walk Time 6 minutes     # of Rest Breaks 0     MPH 1.99     METS 1.58     RPE 13     Perceived Dyspnea  2     VO2 Peak 5.51     Symptoms Yes (comment)      Comments  R leg weakness     Resting HR 62 bpm     Resting BP 132/64     Resting Oxygen  Saturation  93 %     Exercise Oxygen  Saturation  during 6 min walk 87 %     Max Ex. HR 84 bpm     Max Ex. BP 132/64     2 Minute Post BP 114/58       Interval HR   1 Minute HR 76     2 Minute HR 80     3 Minute HR 81     4 Minute HR 81     5 Minute HR 84     6 Minute HR 83     2 Minute Post HR 63     Interval Heart Rate? Yes       Interval Oxygen    Interval Oxygen ? Yes     Baseline Oxygen  Saturation % 93 %     1 Minute Oxygen  Saturation % 91 %     1 Minute Liters of Oxygen  2 L     2 Minute Oxygen  Saturation % 88 %     2 Minute Liters of Oxygen  2 L     3 Minute Oxygen  Saturation % 87 %     3 Minute Liters of Oxygen  2 L     4 Minute Oxygen  Saturation % 87 %     4 Minute Liters of Oxygen  2 L     5 Minute Oxygen  Saturation % 87 %     5 Minute Liters of Oxygen  2 L     6 Minute Oxygen  Saturation % 87 %     6 Minute Liters of Oxygen  2 L     2 Minute Post Oxygen  Saturation % 94 %     2 Minute Post Liters of Oxygen  2 L       Oxygen  Initial Assessment:  Oxygen  Initial Assessment - 01/15/24 1302       Home Oxygen    Home Oxygen  Device Home Concentrator;E-Tanks;Portable Concentrator    Sleep Oxygen  Prescription Continuous    Liters per minute 2    Home Exercise Oxygen  Prescription Continuous    Liters per minute 3   2-3   Home Resting Oxygen  Prescription Continuous    Liters per minute 2.5    Compliance with Home Oxygen  Use Yes          Oxygen  Re-Evaluation:  Oxygen  Re-Evaluation     Row Name 01/23/24 1134 03/17/24 1134 04/07/24 1148         Program Oxygen  Prescription   Program Oxygen  Prescription Continuous Continuous;E-Tanks Continuous;E-Tanks     Liters per minute 2 2 2        Home Oxygen    Home Oxygen  Device Home Concentrator;E-Tanks;Portable Concentrator Home Concentrator;E-Tanks;Portable Concentrator Home Concentrator;E-Tanks;Portable Concentrator     Sleep Oxygen   Prescription Continuous Continuous Continuous;CPAP  does not wear CPAP     Liters per minute 2 2 2      Home Exercise Oxygen  Prescription Continuous Continuous Continuous     Liters per minute 3 3 3      Home Resting Oxygen  Prescription Continuous Continuous Continuous     Liters per minute 2.5 2 2      Compliance with Home Oxygen  Use Yes Yes Yes       Goals/Expected Outcomes   Short Term Goals To learn and demonstrate proper pursed lip breathing techniques or other breathing techniques.  To learn and demonstrate proper pursed lip breathing techniques or other  breathing techniques.  To learn and understand importance of maintaining oxygen  saturations>88%;Other     Long  Term Goals Exhibits proper breathing techniques, such as pursed lip breathing or other method taught during program session Exhibits proper breathing techniques, such as pursed lip breathing or other method taught during program session Other;Maintenance of O2 saturations>88%     Comments Reviewed PLB technique with pt.  Talked about how it works and it's importance in maintaining their exercise saturations. Informed patient how to perform the Pursed Lipped breathing technique. Told patient to Inhale through the nose and out the mouth with pursed lips to keep their airways open, help oxygenate them better, practice when at rest or doing strenuous activity. Patient Verbalizes understanding of technique and will work on and be reiterated during [facility]. Milagro states that she feels more short of breath lately. She states she does not use her CPAP, wakes up in the morning with her oxygen  levels at 84 percent. Informed and explained the difference between shortness of breath and oxygenation. Also informed patient to try to wear her CPAP if she can tolerate it.     Goals/Expected Outcomes Short: Become more profiecient at using PLB. Long: Become independent at using PLB. Short: use PLB with exertion. Long: use PLB on exertion proficiently and  independently. Short: try to wear CPAP again. Long: maintain oxygen  saturation and sleep apnea independently.        Oxygen  Discharge (Final Oxygen  Re-Evaluation):  Oxygen  Re-Evaluation - 04/07/24 1148       Program Oxygen  Prescription   Program Oxygen  Prescription Continuous;E-Tanks    Liters per minute 2      Home Oxygen    Home Oxygen  Device Home Concentrator;E-Tanks;Portable Concentrator    Sleep Oxygen  Prescription Continuous;CPAP   does not wear CPAP   Liters per minute 2    Home Exercise Oxygen  Prescription Continuous    Liters per minute 3    Home Resting Oxygen  Prescription Continuous    Liters per minute 2    Compliance with Home Oxygen  Use Yes      Goals/Expected Outcomes   Short Term Goals To learn and understand importance of maintaining oxygen  saturations>88%;Other    Long  Term Goals Other;Maintenance of O2 saturations>88%    Comments Turkessa states that she feels more short of breath lately. She states she does not use her CPAP, wakes up in the morning with her oxygen  levels at 84 percent. Informed and explained the difference between shortness of breath and oxygenation. Also informed patient to try to wear her CPAP if she can tolerate it.    Goals/Expected Outcomes Short: try to wear CPAP again. Long: maintain oxygen  saturation and sleep apnea independently.          Initial Exercise Prescription:  Initial Exercise Prescription - 01/17/24 1100       Date of Initial Exercise RX and Referring Provider   Date 01/17/24    Referring Provider Dr. Elfreda Bathe, MD      Oxygen    Oxygen  Continuous    Liters 2-3    Maintain Oxygen  Saturation 88% or higher      Treadmill   MPH 1.8    Grade 0    Minutes 15    METs 2.38      Recumbant Bike   Level 1    RPM 50    Watts 15    Minutes 15    METs 1.58      NuStep   Level 2    SPM  80    Minutes 15    METs 1.58      Prescription Details   Frequency (times per week) 2    Duration Progress to 30 minutes of  continuous aerobic without signs/symptoms of physical distress      Intensity   THRR 40-80% of Max Heartrate 94-126    Ratings of Perceived Exertion 11-13    Perceived Dyspnea 0-4      Progression   Progression Continue to progress workloads to maintain intensity without signs/symptoms of physical distress.      Resistance Training   Training Prescription Yes    Weight 3 lb    Reps 10-15          Perform Capillary Blood Glucose checks as needed.  Exercise Prescription Changes:   Exercise Prescription Changes     Row Name 01/17/24 1100 01/30/24 1500 02/13/24 1500 02/28/24 1100 03/11/24 1100     Response to Exercise   Blood Pressure (Admit) 120/60 108/58 108/62 104/60 104/60   Blood Pressure (Exercise) 132/64 122/64 132/58 132/52 --   Blood Pressure (Exit) 114/58 116/58 104/56 108/64 104/72   Heart Rate (Admit) 62 bpm 73 bpm 68 bpm 74 bpm 65 bpm   Heart Rate (Exercise) 84 bpm 80 bpm 85 bpm 85 bpm 93 bpm   Heart Rate (Exit) 63 bpm 76 bpm 69 bpm 66 bpm 75 bpm   Oxygen  Saturation (Admit) 93 % 92 % 93 % 89 % 93 %   Oxygen  Saturation (Exercise) 87 % 88 % 88 % 88 % 89 %   Oxygen  Saturation (Exit) 94 % 89 % 91 % 94 % 90 %   Rating of Perceived Exertion (Exercise) 13 13 15 14 15    Perceived Dyspnea (Exercise) 2 -- 4 2 2    Symptoms R leg weakness none none none none   Comments Results first 2 weeks of exercise -- -- --   Duration -- Progress to 30 minutes of  aerobic without signs/symptoms of physical distress Progress to 30 minutes of  aerobic without signs/symptoms of physical distress Continue with 30 min of aerobic exercise without signs/symptoms of physical distress. Continue with 30 min of aerobic exercise without signs/symptoms of physical distress.   Intensity -- THRR unchanged THRR unchanged THRR unchanged THRR unchanged     Progression   Progression -- Continue to progress workloads to maintain intensity without signs/symptoms of physical distress. Continue to  progress workloads to maintain intensity without signs/symptoms of physical distress. Continue to progress workloads to maintain intensity without signs/symptoms of physical distress. Continue to progress workloads to maintain intensity without signs/symptoms of physical distress.   Average METs -- 2.44 2.4 2.69 2.52     Resistance Training   Training Prescription -- Yes Yes Yes Yes   Weight -- 3 lb 3 lb 3 lb 3 lb   Reps -- 10-15 10-15 10-15 10-15     Interval Training   Interval Training -- No No No No     Oxygen    Oxygen  -- Continuous Continuous Continuous Continuous   Liters -- 2-3 2-3 2-3 2-3L     Treadmill   MPH -- 1.8 1.8 1.8 2   Grade -- 0 0 1.5 0   Minutes -- 15 15 15 15    METs -- 2.38 2.38 2.75 2.53     Recumbant Bike   Level -- -- 1.1 2.5 2.6   Watts -- -- 15 15 15    Minutes -- -- 15 15 15    METs -- --  2.55 2.54 2.55     NuStep   Level -- 2 3 4 6    Minutes -- 15 15 15 15    METs -- 2.5 2.6 3.1 2     Oxygen    Maintain Oxygen  Saturation -- 88% or higher 88% or higher 88% or higher --    Row Name 03/24/24 1100 03/27/24 1400 04/09/24 1100         Response to Exercise   Blood Pressure (Admit) -- 122/60 108/60     Blood Pressure (Exit) -- 112/58 118/62     Heart Rate (Admit) -- 76 bpm 69 bpm     Heart Rate (Exercise) -- 92 bpm 93 bpm     Heart Rate (Exit) -- 83 bpm 72 bpm     Oxygen  Saturation (Admit) -- 90 % 92 %     Oxygen  Saturation (Exercise) -- 88 % 87 %     Oxygen  Saturation (Exit) -- 90 % 90 %     Rating of Perceived Exertion (Exercise) -- 15 15     Perceived Dyspnea (Exercise) -- 3 3     Symptoms -- none none     Duration -- Continue with 30 min of aerobic exercise without signs/symptoms of physical distress. Continue with 30 min of aerobic exercise without signs/symptoms of physical distress.     Intensity -- THRR unchanged THRR unchanged       Progression   Progression -- Continue to progress workloads to maintain intensity without signs/symptoms of  physical distress. Continue to progress workloads to maintain intensity without signs/symptoms of physical distress.     Average METs -- 2.4 2.83       Resistance Training   Training Prescription -- Yes Yes     Weight -- 3 lb 3 lb     Reps -- 10-15 10-15       Interval Training   Interval Training -- No No       Oxygen    Oxygen  -- Continuous Continuous     Liters -- 2-3L 3L       Treadmill   MPH -- 2 2     Grade -- 0.5 5     Minutes -- 15 15     METs -- 2.67 3.91       Recumbant Bike   Level -- 3 2.5     Watts -- 15 15     Minutes -- 15 15     METs -- 2.56 2.56       NuStep   Level -- 4 4     Minutes -- 15 15     METs -- 3.2 3.1       T5 Nustep   Level -- 3 --     Minutes -- 15 --     METs -- 1.8 --       Home Exercise Plan   Plans to continue exercise at [facility] plans to continue exercising at the [employer] for home exercise. She currently goes once a week and will increase to three times a week after graduation. [facility]  Kynleigh plans to continue exercising at the [employer] for home exercise. She currently goes once a week and will increase to three times a week after graduation. [facility]  Omaria plans to continue exercising at the [employer] for home exercise. She currently goes once a week and will increase to three times a week after graduation.     Frequency Add 3 additional  days to program exercise sessions. Add 3 additional days to program exercise sessions. Add 3 additional days to program exercise sessions.     Initial Home Exercises Provided 03/24/24 03/24/24 03/24/24       Oxygen    Maintain Oxygen  Saturation -- 88% or higher 88% or higher        Exercise Comments:   Exercise Comments     Row Name 01/23/24 1133           Exercise Comments First full day of exercise!  Patient was oriented to gym and equipment including functions, settings, policies, and procedures.  Patient's individual exercise prescription and  treatment plan were reviewed.  All starting workloads were established based on the results of the 6 minute walk test done at initial orientation visit.  The plan for exercise progression was also introduced and progression will be customized based on patient's performance and goals.          Exercise Goals and Review:   Exercise Goals     Row Name 01/17/24 1150             Exercise Goals   Increase Physical Activity Yes       Intervention Provide advice, education, support and counseling about physical activity/exercise needs.;Develop an individualized exercise prescription for aerobic and resistive training based on initial evaluation findings, risk stratification, comorbidities and participant's personal goals.       Expected Outcomes Short Term: Attend rehab on a regular basis to increase amount of physical activity.;Long Term: Add in home exercise to make exercise part of routine and to increase amount of physical activity.;Long Term: Exercising regularly at least 3-5 days a week.       Increase Strength and Stamina Yes       Intervention Provide advice, education, support and counseling about physical activity/exercise needs.;Develop an individualized exercise prescription for aerobic and resistive training based on initial evaluation findings, risk stratification, comorbidities and participant's personal goals.       Expected Outcomes Short Term: Increase workloads from initial exercise prescription for resistance, speed, and METs.;Short Term: Perform resistance training exercises routinely during rehab and add in resistance training at home;Long Term: Improve cardiorespiratory fitness, muscular endurance and strength as measured by increased METs and functional capacity ( )       Able to understand and use rate of perceived exertion (RPE) scale Yes       Intervention Provide education and explanation on how to use RPE scale       Expected Outcomes Short Term: Able to use RPE daily  in rehab to express subjective intensity level;Long Term:  Able to use RPE to guide intensity level when exercising independently       Able to understand and use Dyspnea scale Yes       Intervention Provide education and explanation on how to use Dyspnea scale       Expected Outcomes Short Term: Able to use Dyspnea scale daily in rehab to express subjective sense of shortness of breath during exertion;Long Term: Able to use Dyspnea scale to guide intensity level when exercising independently       Knowledge and understanding of Target Heart Rate Range (THRR) Yes       Intervention Provide education and explanation of THRR including how the numbers were predicted and where they are located for reference       Expected Outcomes Short Term: Able to state/look up THRR;Long Term: Able to use THRR to govern intensity when exercising independently;Short  Term: Able to use daily as guideline for intensity in rehab       Able to check pulse independently Yes       Intervention Provide education and demonstration on how to check pulse in carotid and radial arteries.;Review the importance of being able to check your own pulse for safety during independent exercise       Expected Outcomes Short Term: Able to explain why pulse checking is important during independent exercise;Long Term: Able to check pulse independently and accurately       Understanding of Exercise Prescription Yes       Intervention Provide education, explanation, and written materials on patient's individual exercise prescription       Expected Outcomes Short Term: Able to explain program exercise prescription;Long Term: Able to explain home exercise prescription to exercise independently          Exercise Goals Re-Evaluation :  Exercise Goals Re-Evaluation     Row Name 01/23/24 1135 01/30/24 1556 02/13/24 1518 02/28/24 1127 03/11/24 1127     Exercise Goal Re-Evaluation   Exercise Goals Review Able to understand and use rate of perceived  exertion (RPE) scale;Able to understand and use Dyspnea scale;Knowledge and understanding of Target Heart Rate Range (THRR);Understanding of Exercise Prescription Increase Physical Activity;Increase Strength and Stamina;Understanding of Exercise Prescription Increase Physical Activity;Increase Strength and Stamina;Understanding of Exercise Prescription Increase Physical Activity;Increase Strength and Stamina;Understanding of Exercise Prescription Increase Physical Activity;Increase Strength and Stamina;Understanding of Exercise Prescription   Comments Reviewed RPE and dyspnea scale, THR and program prescription with pt today.  Pt voiced understanding and was given a copy of goals to take home. Annalena is off to a good start in the program. She was able to complete her first session during this review period. During her session she used the treadmill at a speed of 1.54mph and no incline, and the T4 nustep at level 2. We will continue to monitor her progress in the program. Analeya is doing well in rehab. She has recently been able to increase from level 2 to 3 on the T4 nustep. She was also able to increase her speed on the treadmill to 1.14mph. We will contiue to monitor her progress in the program. Savannha continues to do well in rehab. She increased her treadmill workload by increasing her incline to 1.5% with a speed of 1.8 mph. She also improved to level 4 on the T4 nustep and level 2.5 on the recumbent bike. We will continue to monitor her progress in the program. Christella is doing well in rehab. She was recently able to increase from level 4 to 6 on the T4 nustep. She was also able to use the treadmill at a speed of and no incline. We will continue to monitor her progress in the program.   Expected Outcomes Short: Use RPE daily to regulate intensity. Long: Follow program prescription in THR. Short: Continue to follow exercise prescription. Long: Continue exercise to improve strength and stamina. Short: Continue to  follow exercise prescription. Long: Continue exercise to improve strength and stamina. Short: Continue to progressively increase workloads. Long: Continue exercise to improve strength and stamina. Short: Continue to progressively increase workloads. Long: Continue exercise to improve strength and stamina.    Row Name 03/24/24 1139 03/27/24 1406 04/07/24 1130 04/09/24 1140       Exercise Goal Re-Evaluation   Exercise Goals Review Increase Physical Activity;Able to understand and use Dyspnea scale;Understanding of Exercise Prescription;Knowledge and understanding of Target Heart Rate Range (THRR);Increase  Strength and Stamina;Able to check pulse independently;Able to understand and use rate of perceived exertion (RPE) scale Increase Physical Activity;Increase Strength and Stamina;Understanding of Exercise Prescription Increase Physical Activity;Increase Strength and Stamina;Understanding of Exercise Prescription Increase Physical Activity;Increase Strength and Stamina;Understanding of Exercise Prescription    Comments Reviewed home exercise with pt today from 11:25 to 11:40.  Pt plans [facility].  Reviewed THR, pulse, RPE, sign and symptoms, pulse oximetery and when to call 911 or MD.  Also discussed weather considerations and indoor options.  Pt voiced understanding. Tisha is doing well in rehab. She was recently able to increase her workload on the treadmill to a speed of and an incline of 0.5%. She was also able to increase from level 2.6 to 3 on the recumbent bike. We will continue to monitor her progress in the program. Follow up on home exercise was done with patient. She reports that she is trying to do hand weight and stretching exercises on days that she is not in class. [facility] also [facility] and exercises on Fridays there when she is not in pulmonary rehab. She is gett in 4-5 days a week of exericse total. Vashon continues to do well  in rehab. She increased her workload on the treadmill to a speed of 2 mph and incline of 5%. She maintained level 4 on the T4 nustep. She worked at level 2.5 on the recumbent bike. We will continue to monitor her progress in the program.    Expected Outcomes Short: Continue to exercise at home. Long: Continue exercise to improve strength and stamina. Short: Continue to exercise treadmill workload. Long: Continue exercise to improve strength and stamina. Short: continue to exercise 1-2 days a week at home or at the [facility] while continuing pulmonary rehab. Long: maintain independent exercise routine upon graduation from rehab. Short: Try level 5 on the T4 nustep. Long: Continue exercise to improve strength and stamina.       Discharge Exercise Prescription (Final Exercise Prescription Changes):  Exercise Prescription Changes - 04/09/24 1100       Response to Exercise   Blood Pressure (Admit) 108/60    Blood Pressure (Exit) 118/62    Heart Rate (Admit) 69 bpm    Heart Rate (Exercise) 93 bpm    Heart Rate (Exit) 72 bpm    Oxygen  Saturation (Admit) 92 %    Oxygen  Saturation (Exercise) 87 %    Oxygen  Saturation (Exit) 90 %    Rating of Perceived Exertion (Exercise) 15    Perceived Dyspnea (Exercise) 3    Symptoms none    Duration Continue with 30 min of aerobic exercise without signs/symptoms of physical distress.    Intensity THRR unchanged      Progression   Progression Continue to progress workloads to maintain intensity without signs/symptoms of physical distress.    Average METs 2.83      Resistance Training   Training Prescription Yes    Weight 3 lb    Reps 10-15      Interval Training   Interval Training No      Oxygen    Oxygen  Continuous    Liters 3L      Treadmill   MPH 2    Grade 5    Minutes 15    METs 3.91      Recumbant Bike   Level 2.5    Watts 15    Minutes 15    METs  2.56      NuStep   Level 4    Minutes 15    METs 3.1      Home Exercise Plan    Plans to continue exercise at [facility] plans to continue exercising at the [facility] for home exercise. She currently goes once a week and will increase to three times a week after graduation.   Frequency Add 3 additional days to program exercise sessions.    Initial Home Exercises Provided 03/24/24      Oxygen    Maintain Oxygen  Saturation 88% or higher          Nutrition:  Target Goals: Understanding of nutrition guidelines, daily intake of sodium 1500mg , cholesterol 200mg , calories 30% from fat and 7% or less from saturated fats, daily to have 5 or more servings of fruits and vegetables.  Education: Nutrition 1 -Group instruction provided by verbal, written material, interactive activities, discussions, models, and posters to present general guidelines for heart healthy nutrition including macronutrients, label reading, and promoting whole foods over processed counterparts. Education serves as Pensions consultant of discussion of heart healthy eating for all. Written material provided at class time.     Education: Nutrition 2 -Group instruction provided by verbal, written material, interactive activities, discussions, models, and posters to present general guidelines for heart healthy nutrition including sodium, cholesterol, and saturated fat. Providing guidance of habit forming to improve blood pressure, cholesterol, and body weight. Written material provided at class time.     Biometrics:  Pre Biometrics - 01/17/24 1151       Pre Biometrics   Height 5' 4.25 (1.632 m)    Weight 188 lb 14.4 oz (85.7 kg)    Waist Circumference 40 inches    Hip Circumference 45 inches    Waist to Hip Ratio 0.89 %    BMI (Calculated) 32.17    Single Leg Stand 1.6 seconds           Nutrition Therapy Plan and Nutrition Goals:  Nutrition Therapy & Goals - 01/17/24 1124       Nutrition Therapy   Diet carb controlled, Cardiac Low Na    Protein (specify units) 90    Fiber 25  grams    Whole Grain Foods 3 servings    Saturated Fats 15 max. grams    Fruits and Vegetables 5 servings/day    Sodium 2 grams      Personal Nutrition Goals   Nutrition Goal Eat 15-30gProtein and 30-60gCarbs at each meal.    Personal Goal #2 Read labels and reduce sodium intake to below 2300mg . Ideally 1500mg  per day.    Comments Patient drinking mostly water, getting ~40-64oz daily. She says she usually eats 3 meals per day. Reviewed her food recall, educated on importance of pairing quality carbs with protein and healthy fat. She already monitors her sodium intake, says she doesn't cook or use it at the table. Encouraged her to read labels and try and keep sodium below 1500mg . Provided Mediterranean diet handout. Educated on types of fats, sources, and how to read labels. Brainstormed several meals and snack ideas and went over sample plate handout.      Intervention Plan   Intervention Prescribe, educate and counsel regarding individualized specific dietary modifications aiming towards targeted core components such as weight, hypertension, lipid management, diabetes, heart failure and other comorbidities.;Nutrition handout(s) given to patient.    Expected Outcomes Short Term Goal: Understand basic principles of dietary content, such as calories, fat, sodium,  cholesterol and nutrients.;Short Term Goal: A plan has been developed with personal nutrition goals set during dietitian appointment.;Long Term Goal: Adherence to prescribed nutrition plan.          Nutrition Assessments:  MEDIFICTS Score Key: >=70 Need to make dietary changes  40-70 Heart Healthy Diet <= 40 Therapeutic Level Cholesterol Diet  Flowsheet Row Pulmonary Rehab from 01/17/2024 in [facility] Cardiac and Pulmonary Rehab  Picture Your Plate Total Score on Admission 58   Picture Your Plate Scores: <59 Unhealthy dietary pattern with much room for improvement. 41-50 Dietary pattern unlikely to meet recommendations for good  health and room for improvement. 51-60 More healthful dietary pattern, with some room for improvement.  >60 Healthy dietary pattern, although there may be some specific behaviors that could be improved.   Nutrition Goals Re-Evaluation:  Nutrition Goals Re-Evaluation     Row Name 03/17/24 1133 04/07/24 1154           Goals   Comment Patient was informed on why it is important to maintain a balanced diet when dealing with Respiratory issues. Explained that it takes a lot of energy to breath and when they are short of breath often they will need to have a good diet to help keep up with the calories they are expending for breathing. Latese states that she has cut out sweets and has been improving her diet. She is eating more protiens and vegetables.      Expected Outcome Short: Choose and plan snacks accordingly to patients caloric intake to improve breathing. Long: Maintain a diet independently that meets their caloric intake to aid in daily shortness of breath. Short: eat smaller portions. Long: adhere to a diet that pertains to her.         Nutrition Goals Discharge (Final Nutrition Goals Re-Evaluation):  Nutrition Goals Re-Evaluation - 04/07/24 1154       Goals   Comment Marzetta states that she has cut out sweets and has been improving her diet. She is eating more protiens and vegetables.    Expected Outcome Short: eat smaller portions. Long: adhere to a diet that pertains to her.          Psychosocial: Target Goals: Acknowledge presence or absence of significant depression and/or stress, maximize coping skills, provide positive support system. Participant is able to verbalize types and ability to use techniques and skills needed for reducing stress and depression.   Education: Stress, Anxiety, and Depression - Group verbal and visual presentation to define topics covered.  Reviews how body is impacted by stress, anxiety, and depression.  Also discusses healthy ways to reduce stress  and to treat/manage anxiety and depression.  Written material provided at class time. Flowsheet Row Pulmonary Rehab from 08/23/2022 in [facility] Cardiac and Pulmonary Rehab  Date 06/21/22  Educator [facility]  Instruction Review Code 1- [employer] Understanding    Education: Sleep Hygiene -Provides group verbal and written instruction about how sleep can affect your health.  Define sleep hygiene, discuss sleep cycles and impact of sleep habits. Review good sleep hygiene tips.    Initial Review & Psychosocial Screening:  Initial Psych Review & Screening - 01/15/24 1309       Initial Review   Current issues with Current Depression      Family Dynamics   Good Support System? Yes   friends     Barriers   Psychosocial barriers to participate in program The patient should benefit from training in stress management and relaxation.;There are no identifiable barriers  or psychosocial needs.      Screening Interventions   Interventions Encouraged to exercise;To provide support and resources with identified psychosocial needs    Expected Outcomes Short Term goal: Utilizing psychosocial counselor, staff and physician to assist with identification of specific Stressors or current issues interfering with healing process. Setting desired goal for each stressor or current issue identified.;Long Term Goal: Stressors or current issues are controlled or eliminated.;Short Term goal: Identification and review with participant of any Quality of Life or Depression concerns found by scoring the questionnaire.;Long Term goal: The participant improves quality of Life and PHQ9 Scores as seen by post scores and/or verbalization of changes          Quality of Life Scores:  Scores of 19 and below usually indicate a poorer quality of life in these areas.  A difference of  2-3 points is a clinically meaningful difference.  A difference of 2-3 points in the total score of the Quality of Life Index has been associated with  significant improvement in overall quality of life, self-image, physical symptoms, and general health in studies assessing change in quality of life.  PHQ-9: Review Flowsheet  More data exists      03/06/2024 01/17/2024 11/01/2023 06/07/2023 02/12/2023  Depression screen PHQ 2/9  Decreased Interest 0 0 0 0 0  Down, Depressed, Hopeless 0 1 0 0 0  PHQ - 2 Score 0 1 0 0 0  Altered sleeping - 0 - - -  Tired, decreased energy - 2 - - -  Change in appetite - 0 - - -  Feeling bad or failure about yourself  - 0 - - -  Trouble concentrating - 0 - - -  Moving slowly or fidgety/restless - 0 - - -  Suicidal thoughts - 0 - - -  PHQ-9 Score - 3 - - -  Difficult doing work/chores - Not difficult at all - - -   Interpretation of Total Score  Total Score Depression Severity:  1-4 = Minimal depression, 5-9 = Mild depression, 10-14 = Moderate depression, 15-19 = Moderately severe depression, 20-27 = Severe depression   Psychosocial Evaluation and Intervention:  Psychosocial Evaluation - 01/15/24 1324       Psychosocial Evaluation & Interventions   Comments Kavina is returning to pulmonary rehab with pulmonary hypertension. She states she has been working on regulating her medications and prioritizing fluid balance. She wants to attend to work on her stamina and her breathing. She has a history of depression and mentions that its a daily choice on how she is going to feel. She does have friends that help her as well as a monthly call from a support system through her insurance.    Expected Outcomes Short: attend pulmonary rehab for education and exercise Long: develop and maintain positive self care habits.    Continue Psychosocial Services  Follow up required by staff          Psychosocial Re-Evaluation:  Psychosocial Re-Evaluation     Row Name 03/17/24 1137 04/07/24 1158           Psychosocial Re-Evaluation   Current issues with None Identified Current Stress Concerns      Comments Patient  reports no issues with their current mental states, sleep, stress, depression or anxiety. Will follow up with patient in a few weeks for any changes. Kaaren states that her stressors stem from her breathing condition. She feels like she has been slowly going downhill. Her requirement for oxygen  is increasing.  Expected Outcomes Short: Continue to exercise regularly to support mental health and notify staff of any changes. Long: maintain mental health and well being through teaching of rehab or prescribed medications independently. Short: continue lungworks to aid with stress. Long: maintain a stress free environment.      Interventions Encouraged to attend Pulmonary Rehabilitation for the exercise Encouraged to attend Pulmonary Rehabilitation for the exercise      Continue Psychosocial Services  Follow up required by staff Follow up required by staff         Psychosocial Discharge (Final Psychosocial Re-Evaluation):  Psychosocial Re-Evaluation - 04/07/24 1158       Psychosocial Re-Evaluation   Current issues with Current Stress Concerns    Comments Tyjanae states that her stressors stem from her breathing condition. She feels like she has been slowly going downhill. Her requirement for oxygen  is increasing.    Expected Outcomes Short: continue lungworks to aid with stress. Long: maintain a stress free environment.    Interventions Encouraged to attend Pulmonary Rehabilitation for the exercise    Continue Psychosocial Services  Follow up required by staff          Education: Education Goals: Education classes will be provided on a weekly basis, covering required topics. Participant will state understanding/return demonstration of topics presented.  Learning Barriers/Preferences:   General Pulmonary Education Topics:  Infection Prevention: - Provides verbal and written material to individual with discussion of infection control including proper hand washing and proper equipment cleaning  during exercise session. Flowsheet Row Pulmonary Rehab from 03/05/2024 in [facility] Cardiac and Pulmonary Rehab  Date 01/17/24  Educator NT  Instruction Review Code 1- Verbalizes Understanding    Falls Prevention: - Provides verbal and written material to individual with discussion of falls prevention and safety. Flowsheet Row Pulmonary Rehab from 03/05/2024 in [facility] Cardiac and Pulmonary Rehab  Date 01/17/24  Educator NT  Instruction Review Code 1- Verbalizes Understanding    Chronic Lung Disease Review: - Group verbal instruction with posters, models, PowerPoint presentations and videos,  to review new updates, new respiratory medications, new advancements in procedures and treatments. Providing information on websites and 800 numbers for continued self-education. Includes information about supplement oxygen , available portable oxygen  systems, continuous and intermittent flow rates, oxygen  safety, concentrators, and Medicare reimbursement for oxygen . Explanation of Pulmonary Drugs, including class, frequency, complications, importance of spacers, rinsing mouth after steroid MDI's, and proper cleaning methods for nebulizers. Review of basic lung anatomy and physiology related to function, structure, and complications of lung disease. Review of risk factors. Discussion about methods for diagnosing sleep apnea and types of masks and machines for OSA. Includes a review of the use of types of environmental controls: home humidity, furnaces, filters, dust mite/pet prevention, HEPA vacuums. Discussion about weather changes, air quality and the benefits of nasal washing. Instruction on Warning signs, infection symptoms, calling MD promptly, preventive modes, and value of vaccinations. Review of effective airway clearance, coughing and/or vibration techniques. Emphasizing that all should Create an Action Plan. Written material provided at class time. Flowsheet Row Pulmonary Rehab from 03/05/2024 in [facility] Cardiac and  Pulmonary Rehab  Education need identified 01/17/24  Date 03/05/24  Educator jh  Instruction Review Code 1- Verbalizes Understanding    AED/CPR: - Group verbal and written instruction with the use of models to demonstrate the basic use of the AED with the basic ABC's of resuscitation.    Tests and Procedures:  - Group verbal and visual presentation and models provide information about  basic cardiac anatomy and function. Reviews the testing methods done to diagnose heart disease and the outcomes of the test results. Describes the treatment choices: Medical Management, Angioplasty, or Coronary Bypass Surgery for treating various heart conditions including Myocardial Infarction, Angina, Valve Disease, and Cardiac Arrhythmias.  Written material provided at class time. Flowsheet Row Pulmonary Rehab from 03/05/2024 in [facility] Cardiac and Pulmonary Rehab  Date 02/13/24  Educator [facility]  Instruction Review Code 1- Verbalizes Understanding    Medication Safety: - Group verbal and visual instruction to review commonly prescribed medications for heart and lung disease. Reviews the medication, class of the drug, and side effects. Includes the steps to properly store meds and maintain the prescription regimen.  Written material given at graduation. Flowsheet Row Pulmonary Rehab from 03/05/2024 in [facility] Cardiac and Pulmonary Rehab  Date 02/20/24  Educator sb  Instruction Review Code 1- Verbalizes Understanding    Other: -Provides group and verbal instruction on various topics (see comments)   Knowledge Questionnaire Score:  Knowledge Questionnaire Score - 01/17/24 1147       Knowledge Questionnaire Score   Pre Score 13/18           Core Components/Risk Factors/Patient Goals at Admission:  Personal Goals and Risk Factors at Admission - 01/15/24 1307       Core Components/Risk Factors/Patient Goals on Admission    Weight Management Yes;Weight Loss   fluid balance   Intervention Weight  Management: Develop a combined nutrition and exercise program designed to reach desired caloric intake, while maintaining appropriate intake of nutrient and fiber, sodium and fats, and appropriate energy expenditure required for the weight goal.;Weight Management: Provide education and appropriate resources to help participant work on and attain dietary goals.;Weight Management/Obesity: Establish reasonable short term and long term weight goals.;Obesity: Provide education and appropriate resources to help participant work on and attain dietary goals.    Expected Outcomes Short Term: Continue to assess and modify interventions until short term weight is achieved;Long Term: Adherence to nutrition and physical activity/exercise program aimed toward attainment of established weight goal;Weight Loss: Understanding of general recommendations for a balanced deficit meal plan, which promotes 1-2 lb weight loss per week and includes a negative energy balance of [phone] kcal/d;Understanding recommendations for meals to include 15-35% energy as protein, 25-35% energy from fat, 35-60% energy from carbohydrates, less than 200mg  of dietary cholesterol, 20-35 gm of total fiber daily;Understanding of distribution of calorie intake throughout the day with the consumption of 4-5 meals/snacks    Improve shortness of breath with ADL's Yes    Intervention Provide education, individualized exercise plan and daily activity instruction to help decrease symptoms of SOB with activities of daily living.    Expected Outcomes Short Term: Improve cardiorespiratory fitness to achieve a reduction of symptoms when performing ADLs;Long Term: Be able to perform more ADLs without symptoms or delay the onset of symptoms    Diabetes Yes    Intervention Provide education about signs/symptoms and action to take for hypo/hyperglycemia.;Provide education about proper nutrition, including hydration, and aerobic/resistive exercise prescription along  with prescribed medications to achieve blood glucose in normal ranges: Fasting glucose 65-99 mg/dL    Expected Outcomes Short Term: Participant verbalizes understanding of the signs/symptoms and immediate care of hyper/hypoglycemia, proper foot care and importance of medication, aerobic/resistive exercise and nutrition plan for blood glucose control.;Long Term: Attainment of HbA1C < 7%.    Heart Failure Yes    Intervention Provide a combined exercise and nutrition program that is supplemented with education,  support and counseling about heart failure. Directed toward relieving symptoms such as shortness of breath, decreased exercise tolerance, and extremity edema.    Expected Outcomes Improve functional capacity of life;Short term: Attendance in program 2-3 days a week with increased exercise capacity. Reported lower sodium intake. Reported increased fruit and vegetable intake. Reports medication compliance.;Short term: Daily weights obtained and reported for increase. Utilizing diuretic protocols set by physician.;Long term: Adoption of self-care skills and reduction of barriers for early signs and symptoms recognition and intervention leading to self-care maintenance.          Education:Diabetes - Individual verbal and written instruction to review signs/symptoms of diabetes, desired ranges of glucose level fasting, after meals and with exercise. Acknowledge that pre and post exercise glucose checks will be done for 3 sessions at entry of program. Flowsheet Row Pulmonary Rehab from 08/23/2022 in [facility] Cardiac and Pulmonary Rehab  Date 05/15/22  Educator [facility]  Instruction Review Code 1- Verbalizes Understanding    Know Your Numbers and Heart Failure: - Group verbal and visual instruction to discuss disease risk factors for cardiac and pulmonary disease and treatment options.  Reviews associated critical values for Overweight/Obesity, Hypertension, Cholesterol, and Diabetes.  Discusses basics of  heart failure: signs/symptoms and treatments.  Introduces Heart Failure Zone chart for action plan for heart failure. Written material provided at class time. Flowsheet Row Pulmonary Rehab from 03/05/2024 in [facility] Cardiac and Pulmonary Rehab  Date 02/27/24  Educator sb  Instruction Review Code 1- Verbalizes Understanding    Core Components/Risk Factors/Patient Goals Review:   Goals and Risk Factor Review     Row Name 03/17/24 1133 04/07/24 1153           Core Components/Risk Factors/Patient Goals Review   Personal Goals Review Improve shortness of breath with ADL's Weight Management/Obesity      Review Spoke to patient about their shortness of breath and what they can do to improve. Patient has been informed of breathing techniques when starting the program. Patient is informed to tell staff if they have had any med changes and that certain meds they are taking or not taking can be causing shortness of breath. Cortasia states she would like to lose some weight and has been trying to changer her diet more. She does not use her CPAP routinely which could hinder her losing weight.      Expected Outcomes Short: Attend [facility] regularly to improve shortness of breath with ADL's. Long: maintain independence with ADL's Short: try to use CPAP. Long: reach her weight goal.         Core Components/Risk Factors/Patient Goals at Discharge (Final Review):   Goals and Risk Factor Review - 04/07/24 1153       Core Components/Risk Factors/Patient Goals Review   Personal Goals Review Weight Management/Obesity    Review Kirat states she would like to lose some weight and has been trying to changer her diet more. She does not use her CPAP routinely which could hinder her losing weight.    Expected Outcomes Short: try to use CPAP. Long: reach her weight goal.          ITP Comments:  ITP Comments     Row Name 01/15/24 1320 01/17/24 1145 01/23/24 1133 01/30/24 1050 02/27/24 1055   ITP Comments Initial  phone call completed. Diagnosis can be found in [facility] 5/12. EP Orientation scheduled for Thursday 5/22 at 9:30am. Completed and gym orientation for respiratory care services. Initial ITP created and sent for review  to Dr. Faud Aleskerov, Medical Director. First full day of exercise!  Patient was oriented to gym and equipment including functions, settings, policies, and procedures.  Patient's individual exercise prescription and treatment plan were reviewed.  All starting workloads were established based on the results of the 6 minute walk test done at initial orientation visit.  The plan for exercise progression was also introduced and progression will be customized based on patient's performance and goals. 30 Day review completed. Medical Director ITP review done, changes made as directed, and signed approval by Medical Director.    new to program 30 Day review completed. Medical Director ITP review done, changes made as directed, and signed approval by Medical Director.    Row Name 03/26/24 1005 04/23/24 1024         ITP Comments 30 Day review completed. Medical Director ITP review done, changes made as directed, and signed approval by Medical Director. 30 Day review completed. Medical Director ITP review done, changes made as directed, and signed approval by Medical Director.         Comments: 30 day review

## 2024-04-23 NOTE — Progress Notes (Signed)
 Daily Session Note  Patient Details  Name: Amber Holder MRN: [id number] Date of Birth: [birth year] Referring Provider:   Flowsheet Row Pulmonary Rehab from 01/17/2024 in [facility] Cardiac and Pulmonary Rehab  Referring Provider Dr. Elfreda Bathe, MD    Encounter Date: 04/23/2024  Check In:  Session Check In - 04/23/24 1116       Check-In   Supervising physician immediately available to respond to emergencies See telemetry face sheet for immediately available ER MD    Location [facility]-Cardiac & Pulmonary Rehab    Staff Present Danney Daring, BS, RRT, CPFT;Margaret Best, MS, Exercise Physiologist;Maxon Conetta BS, Exercise Physiologist;Carnel Stegman RN,BSN    Virtual Visit No    Medication changes reported     No    Fall or balance concerns reported    No    Tobacco Cessation No Change    Warm-up and Cool-down Performed on first and last piece of equipment    Resistance Training Performed Yes    VAD Patient? No    PAD/SET Patient? No      Pain Assessment   Currently in Pain? No/denies             Social History   Tobacco Use  Smoking Status Former   Current packs/day: 0.00   Average packs/day: 1 pack/day for 50.0 years (50.0 ttl pk-yrs)   Types: Cigarettes   Start date: 12/29/1971   Quit date: 12/28/2021   Years since quitting: 2.3   Passive exposure: Never  Smokeless Tobacco Never    Goals Met:  Proper associated with RPD/PD & O2 Sat Independence with exercise equipment Using PLB without cueing & demonstrates good technique Exercise tolerated well No report of concerns or symptoms today Strength training completed today  Goals Unmet:  Not Applicable  Comments: Pt able to follow exercise prescription today without complaint.  Will continue to monitor for progression.    Dr. Oneil Pinal is Medical Director for [facility] Cardiac Rehabilitation.  Dr. Fuad Aleskerov is Medical Director for [facility] Pulmonary Rehabilitation.

## 2024-04-25 ENCOUNTER — Other Ambulatory Visit: Payer: Self-pay | Admitting: Physician Assistant

## 2024-04-25 DIAGNOSIS — E782 Mixed hyperlipidemia: Secondary | ICD-10-CM

## 2024-04-30 ENCOUNTER — Encounter: Attending: Internal Medicine | Admitting: Emergency Medicine

## 2024-04-30 ENCOUNTER — Encounter: Payer: Self-pay | Admitting: Urology

## 2024-04-30 DIAGNOSIS — I2721 Secondary pulmonary arterial hypertension: Secondary | ICD-10-CM | POA: Diagnosis not present

## 2024-04-30 NOTE — Progress Notes (Signed)
 Daily Session Note  Patient Details  Name: Amber Holder MRN: [id number] Date of Birth: 1946-01-27 Referring Provider:   Flowsheet Row Pulmonary Rehab from 01/17/2024 in [facility] Cardiac and Pulmonary Rehab  Referring Provider Dr. Elfreda Bathe, MD    Encounter Date: 04/30/2024  Check In:  Session Check In - 04/30/24 1118       Check-In   Supervising physician immediately available to respond to emergencies See telemetry face sheet for immediately available ER MD    Location [facility]-Cardiac & Pulmonary Rehab    Staff Present Burnard Hint BS, ACSM CEP, Exercise Physiologist;Joseph Rolinda RCP,RRT,BSRT;Carolee Channell RN,BSN;Meredith Tressa RN,BSN    Virtual Visit No    Medication changes reported     No    Fall or balance concerns reported    No    Tobacco Cessation No Change    Warm-up and Cool-down Performed on first and last piece of equipment    Resistance Training Performed Yes    VAD Patient? No    PAD/SET Patient? No      Pain Assessment   Currently in Pain? No/denies             Social History   Tobacco Use  Smoking Status Former   Current packs/day: 0.00   Average packs/day: 1 pack/day for 50.0 years (50.0 ttl pk-yrs)   Types: Cigarettes   Start date: 12/29/1971   Quit date: 12/28/2021   Years since quitting: 2.3   Passive exposure: Never  Smokeless Tobacco Never    Goals Met:  Proper associated with RPD/PD & O2 Sat Independence with exercise equipment Using PLB without cueing & demonstrates good technique Exercise tolerated well No report of concerns or symptoms today Strength training completed today  Goals Unmet:  Not Applicable  Comments: Pt able to follow exercise prescription today without complaint.  Will continue to monitor for progression.    Dr. Oneil Pinal is Medical Director for [facility] Cardiac Rehabilitation.  Dr. Fuad Aleskerov is Medical Director for [facility] Pulmonary Rehabilitation.

## 2024-05-02 ENCOUNTER — Other Ambulatory Visit: Payer: Self-pay | Admitting: Internal Medicine

## 2024-05-05 ENCOUNTER — Encounter

## 2024-05-05 DIAGNOSIS — I2721 Secondary pulmonary arterial hypertension: Secondary | ICD-10-CM

## 2024-05-05 NOTE — Progress Notes (Signed)
 Daily Session Note  Patient Details  Name: Amber Holder MRN: [id number] Date of Birth: 08/03/1946 Referring Provider:   Flowsheet Row Pulmonary Rehab from 01/17/2024 in [facility] Cardiac and Pulmonary Rehab  Referring Provider Dr. Elfreda Bathe, MD    Encounter Date: 05/05/2024  Check In:  Session Check In - 05/05/24 1139       Check-In   Supervising physician immediately available to respond to emergencies See telemetry face sheet for immediately available ER MD    Location [facility]-Cardiac & Pulmonary Rehab    Staff Present Burnard Davenport RN,BSN,MPA;Joseph Rolinda RCP,RRT,BSRT;Laura Cates RN,BSN;Latrail Pounders Dyane BS, ACSM CEP, Exercise Physiologist;Jason Elnor RDN,LDN    Virtual Visit No    Medication changes reported     No    Fall or balance concerns reported    No    Tobacco Cessation No Change    Warm-up and Cool-down Performed on first and last piece of equipment    Resistance Training Performed Yes    VAD Patient? No    PAD/SET Patient? No      Pain Assessment   Currently in Pain? No/denies             Social History   Tobacco Use  Smoking Status Former   Current packs/day: 0.00   Average packs/day: 1 pack/day for 50.0 years (50.0 ttl pk-yrs)   Types: Cigarettes   Start date: 12/29/1971   Quit date: 12/28/2021   Years since quitting: 2.3   Passive exposure: Never  Smokeless Tobacco Never    Goals Met:  Proper associated with RPD/PD & O2 Sat Independence with exercise equipment Using PLB without cueing & demonstrates good technique Exercise tolerated well No report of concerns or symptoms today Strength training completed today  Goals Unmet:  Not Applicable  Comments: Pt able to follow exercise prescription today without complaint.  Will continue to monitor for progression.    Dr. Oneil Pinal is Medical Director for [facility] Cardiac Rehabilitation.  Dr. Fuad Aleskerov is Medical Director for [facility] Pulmonary Rehabilitation.

## 2024-05-07 ENCOUNTER — Encounter: Admitting: Emergency Medicine

## 2024-05-07 DIAGNOSIS — I2721 Secondary pulmonary arterial hypertension: Secondary | ICD-10-CM | POA: Diagnosis not present

## 2024-05-07 NOTE — Progress Notes (Signed)
 Daily Session Note  Patient Details  Name: Amber Holder MRN: [id number] Date of Birth: 1946/04/15 Referring Provider:   Flowsheet Row Pulmonary Rehab from 01/17/2024 in [facility] Cardiac and Pulmonary Rehab  Referring Provider Dr. Elfreda Bathe, MD    Encounter Date: 05/07/2024  Check In:  Session Check In - 05/07/24 1116       Check-In   Supervising physician immediately available to respond to emergencies See telemetry face sheet for immediately available ER MD    Location [facility]-Cardiac & Pulmonary Rehab    Staff Present Rollene Paterson, MS, Exercise Physiologist;Maxon Conetta BS, Exercise Physiologist;Joseph Rolinda RCP,RRT,BSRT;Shakirra Buehler RN,BSN    Virtual Visit No    Medication changes reported     No    Fall or balance concerns reported    No    Tobacco Cessation No Change    Warm-up and Cool-down Performed on first and last piece of equipment    Resistance Training Performed Yes    VAD Patient? No    PAD/SET Patient? No      Pain Assessment   Currently in Pain? No/denies             Social History   Tobacco Use  Smoking Status Former   Current packs/day: 0.00   Average packs/day: 1 pack/day for 50.0 years (50.0 ttl pk-yrs)   Types: Cigarettes   Start date: 12/29/1971   Quit date: 12/28/2021   Years since quitting: 2.3   Passive exposure: Never  Smokeless Tobacco Never    Goals Met:  Proper associated with RPD/PD & O2 Sat Independence with exercise equipment Using PLB without cueing & demonstrates good technique Exercise tolerated well No report of concerns or symptoms today Strength training completed today  Goals Unmet:  Not Applicable  Comments: Pt able to follow exercise prescription today without complaint.  Will continue to monitor for progression.    Dr. Oneil Pinal is Medical Director for [facility] Cardiac Rehabilitation.  Dr. Fuad Aleskerov is Medical Director for [facility] Pulmonary Rehabilitation.

## 2024-05-12 ENCOUNTER — Encounter

## 2024-05-12 DIAGNOSIS — I2721 Secondary pulmonary arterial hypertension: Secondary | ICD-10-CM

## 2024-05-12 NOTE — Progress Notes (Signed)
 Daily Session Note  Patient Details  Name: Amber Holder MRN: [id number] Date of Birth: 1946-01-13 Referring Provider:   Flowsheet Row Pulmonary Rehab from 01/17/2024 in [facility] Cardiac and Pulmonary Rehab  Referring Provider Dr. Elfreda Bathe, MD    Encounter Date: 05/12/2024  Check In:  Session Check In - 05/12/24 1052       Check-In   Supervising physician immediately available to respond to emergencies See telemetry face sheet for immediately available ER MD    Location [facility]-Cardiac & Pulmonary Rehab    Staff Present Burnard Davenport RN,BSN,MPA;Joseph Rolinda RCP,RRT,BSRT;Laura Cates RN,BSN;Jazzlene Huot Dyane BS, ACSM CEP, Exercise Physiologist    Virtual Visit No    Medication changes reported     No    Fall or balance concerns reported    No    Tobacco Cessation No Change    Warm-up and Cool-down Performed on first and last piece of equipment    Resistance Training Performed Yes    VAD Patient? No    PAD/SET Patient? No      Pain Assessment   Currently in Pain? No/denies             Social History   Tobacco Use  Smoking Status Former   Current packs/day: 0.00   Average packs/day: 1 pack/day for 50.0 years (50.0 ttl pk-yrs)   Types: Cigarettes   Start date: 12/29/1971   Quit date: 12/28/2021   Years since quitting: 2.3   Passive exposure: Never  Smokeless Tobacco Never    Goals Met:  Proper associated with RPD/PD & O2 Sat Independence with exercise equipment Using PLB without cueing & demonstrates good technique Exercise tolerated well No report of concerns or symptoms today Strength training completed today  Goals Unmet:  Not Applicable  Comments: Pt able to follow exercise prescription today without complaint.  Will continue to monitor for progression.    Dr. Oneil Pinal is Medical Director for [facility] Cardiac Rehabilitation.  Dr. Fuad Aleskerov is Medical Director for [facility] Pulmonary Rehabilitation.

## 2024-05-14 ENCOUNTER — Encounter

## 2024-05-14 VITALS — Ht 64.25 in | Wt 187.0 lb

## 2024-05-14 DIAGNOSIS — I2721 Secondary pulmonary arterial hypertension: Secondary | ICD-10-CM | POA: Diagnosis not present

## 2024-05-14 NOTE — Progress Notes (Signed)
 Daily Session Note  Patient Details  Name: Amber Holder MRN: [id number] Date of Birth: Mar 11, 1946 Referring Provider:   Flowsheet Row Pulmonary Rehab from 01/17/2024 in [facility] Cardiac and Pulmonary Rehab  Referring Provider Dr. Elfreda Bathe, MD    Encounter Date: 05/14/2024  Check In:  Session Check In - 05/14/24 1047       Check-In   Supervising physician immediately available to respond to emergencies See telemetry face sheet for immediately available ER MD    Location [facility]-Cardiac & Pulmonary Rehab    Staff Present Burnard Davenport RN,BSN,MPA;Joseph [facility] RCP,RRT,BSRT;Margaret Best, MS, Exercise Physiologist;Jason Elnor RDN,LDN    Virtual Visit No    Medication changes reported     No    Fall or balance concerns reported    No    Tobacco Cessation No Change    Warm-up and Cool-down Performed on first and last piece of equipment    Resistance Training Performed Yes    VAD Patient? No    PAD/SET Patient? No      Pain Assessment   Currently in Pain? No/denies             Social History   Tobacco Use  Smoking Status Former   Current packs/day: 0.00   Average packs/day: 1 pack/day for 50.0 years (50.0 ttl pk-yrs)   Types: Cigarettes   Start date: 12/29/1971   Quit date: 12/28/2021   Years since quitting: 2.3   Passive exposure: Never  Smokeless Tobacco Never    Goals Met:  Independence with exercise equipment Exercise tolerated well No report of concerns or symptoms today Strength training completed today  Goals Unmet:  Not Applicable  Comments: Pt able to follow exercise prescription today without complaint.  Will continue to monitor for progression.    Dr. Oneil Pinal is Medical Director for [facility] Cardiac Rehabilitation.  Dr. Fuad Aleskerov is Medical Director for [facility] Pulmonary Rehabilitation.

## 2024-05-14 NOTE — Patient Instructions (Signed)
 Discharge Patient Instructions  Patient Details  Name: Amber Holder MRN: [id number] Date of Birth: 1945-09-30 Referring Provider:  Kristina Tinnie POUR, PA*   Number of Visits: 6  Reason for Discharge:  Patient reached a stable level of exercise. Patient independent in their exercise. Patient has met program and personal goals.  Smoking History:  Social History   Tobacco Use  Smoking Status Former   Current packs/day: 0.00   Average packs/day: 1 pack/day for 50.0 years (50.0 ttl pk-yrs)   Types: Cigarettes   Start date: 12/29/1971   Quit date: 12/28/2021   Years since quitting: 2.3   Passive exposure: Never  Smokeless Tobacco Never    Diagnosis:  Pulmonary artery hypertension (HCC)  Initial Exercise Prescription:  Initial Exercise Prescription - 01/17/24 1100       Date of Initial Exercise RX and Referring Provider   Date 01/17/24    Referring Provider Dr. Elfreda Bathe, MD      Oxygen    Oxygen  Continuous    Liters 2-3    Maintain Oxygen  Saturation 88% or higher      Treadmill   MPH 1.8    Grade 0    Minutes 15    METs 2.38      Recumbant Bike   Level 1    RPM 50    Watts 15    Minutes 15    METs 1.58      NuStep   Level 2    SPM 80    Minutes 15    METs 1.58      Prescription Details   Frequency (times per week) 2    Duration Progress to 30 minutes of continuous aerobic without signs/symptoms of physical distress      Intensity   THRR 40-80% of Max Heartrate 94-126    Ratings of Perceived Exertion 11-13    Perceived Dyspnea 0-4      Progression   Progression Continue to progress workloads to maintain intensity without signs/symptoms of physical distress.      Resistance Training   Training Prescription Yes    Weight 3 lb    Reps 10-15          Discharge Exercise Prescription (Final Exercise Prescription Changes):  Exercise Prescription Changes - 05/08/24 1600       Response to Exercise   Blood Pressure (Admit) 104/60    Blood  Pressure (Exit) 102/58    Heart Rate (Admit) 69 bpm    Heart Rate (Exercise) 82 bpm    Heart Rate (Exit) 71 bpm    Oxygen  Saturation (Admit) 92 %    Oxygen  Saturation (Exercise) 88 %    Oxygen  Saturation (Exit) 93 %    Rating of Perceived Exertion (Exercise) 15    Perceived Dyspnea (Exercise) 3    Symptoms none    Duration Continue with 30 min of aerobic exercise without signs/symptoms of physical distress.    Intensity THRR unchanged      Progression   Progression Continue to progress workloads to maintain intensity without signs/symptoms of physical distress.    Average METs 3.08      Resistance Training   Training Prescription Yes    Weight 3 lb    Reps 10-15      Interval Training   Interval Training No      Oxygen    Oxygen  Continuous    Liters 3L      Treadmill   MPH 2    Grade 5    Minutes  15    METs 3.91      NuStep   Level 4    Minutes 15    METs 3.1      Home Exercise Plan   Plans to continue exercise at [facility] plans to continue exercising at the [employer] for home exercise. She currently goes once a week and will increase to three times a week after graduation.   Frequency Add 3 additional days to program exercise sessions.    Initial Home Exercises Provided 03/24/24      Oxygen    Maintain Oxygen  Saturation 88% or higher          Functional Capacity:  6 Minute Walk     Row Name 01/17/24 1151 05/14/24 1138       6 Minute Walk   Phase Initial Discharge    Distance 1050 feet 1200 feet    Distance % Change -- 14.29 %    Distance Feet Change -- 150 ft    Walk Time 6 minutes 6 minutes    # of Rest Breaks 0 0    MPH 1.99 2.27    METS 1.58 1.85    RPE 13 14    Perceived Dyspnea  2 3    VO2 Peak 5.51 6.48    Symptoms Yes (comment) Yes (comment)    Comments R leg weakness 2/10 dull R hip pain    Resting HR 62 bpm 72 bpm    Resting BP 132/64 110/60    Resting Oxygen  Saturation  93 % 88 %    Exercise Oxygen  Saturation   during 6 min walk 87 % 85 %    Max Ex. HR 84 bpm 92 bpm    Max Ex. BP 132/64 120/54    2 Minute Post BP 114/58 106/60      Interval HR   1 Minute HR 76 82    2 Minute HR 80 88    3 Minute HR 81 90    4 Minute HR 81 91    5 Minute HR 84 91    6 Minute HR 83 92    2 Minute Post HR 63 72    Interval Heart Rate? Yes Yes      Interval Oxygen    Interval Oxygen ? Yes Yes    Baseline Oxygen  Saturation % 93 % 88 %    1 Minute Oxygen  Saturation % 91 % 89 %    1 Minute Liters of Oxygen  2 L 3 L    2 Minute Oxygen  Saturation % 88 % 88 %    2 Minute Liters of Oxygen  2 L 3 L    3 Minute Oxygen  Saturation % 87 % 85 %    3 Minute Liters of Oxygen  2 L 3 L    4 Minute Oxygen  Saturation % 87 % 86 %    4 Minute Liters of Oxygen  2 L 3 L    5 Minute Oxygen  Saturation % 87 % 85 %    5 Minute Liters of Oxygen  2 L 3 L    6 Minute Oxygen  Saturation % 87 % 87 %    6 Minute Liters of Oxygen  2 L 3 L    2 Minute Post Oxygen  Saturation % 94 % 95 %    2 Minute Post Liters of Oxygen  2 L 3 L      Nutrition & Weight - Outcomes:  Pre Biometrics - 01/17/24 1151       Pre  Biometrics   Height 5' 4.25 (1.632 m)    Weight 188 lb 14.4 oz (85.7 kg)    Waist Circumference 40 inches    Hip Circumference 45 inches    Waist to Hip Ratio 0.89 %    BMI (Calculated) 32.17    Single Leg Stand 1.6 seconds          Post Biometrics - 05/14/24 1141        Post  Biometrics   Height 5' 4.25 (1.632 m)    Weight 187 lb (84.8 kg)    Waist Circumference 39.6 inches    Hip Circumference 43 inches    Waist to Hip Ratio 0.92 %    BMI (Calculated) 31.85    Single Leg Stand 1.6 seconds          Nutrition:  Nutrition Therapy & Goals - 01/17/24 1124       Nutrition Therapy   Diet carb controlled, Cardiac Low Na    Protein (specify units) 90    Fiber 25 grams    Whole Grain Foods 3 servings    Saturated Fats 15 max. grams    Fruits and Vegetables 5 servings/day    Sodium 2 grams      Personal Nutrition  Goals   Nutrition Goal Eat 15-30gProtein and 30-60gCarbs at each meal.    Personal Goal #2 Read labels and reduce sodium intake to below 2300mg . Ideally 1500mg  per day.    Comments Patient drinking mostly water, getting ~40-64oz daily. She says she usually eats 3 meals per day. Reviewed her food recall, educated on importance of pairing quality carbs with protein and healthy fat. She already monitors her sodium intake, says she doesn't cook or use it at the table. Encouraged her to read labels and try and keep sodium below 1500mg . Provided Mediterranean diet handout. Educated on types of fats, sources, and how to read labels. Brainstormed several meals and snack ideas and went over sample plate handout.      Intervention Plan   Intervention Prescribe, educate and counsel regarding individualized specific dietary modifications aiming towards targeted core components such as weight, hypertension, lipid management, diabetes, heart failure and other comorbidities.;Nutrition handout(s) given to patient.    Expected Outcomes Short Term Goal: Understand basic principles of dietary content, such as calories, fat, sodium, cholesterol and nutrients.;Short Term Goal: A plan has been developed with personal nutrition goals set during dietitian appointment.;Long Term Goal: Adherence to prescribed nutrition plan.

## 2024-05-16 ENCOUNTER — Encounter: Payer: Self-pay | Admitting: Emergency Medicine

## 2024-05-19 ENCOUNTER — Encounter

## 2024-05-21 ENCOUNTER — Encounter

## 2024-05-21 DIAGNOSIS — I2721 Secondary pulmonary arterial hypertension: Secondary | ICD-10-CM

## 2024-05-21 DIAGNOSIS — J449 Chronic obstructive pulmonary disease, unspecified: Secondary | ICD-10-CM

## 2024-05-21 NOTE — Progress Notes (Signed)
 Pulmonary Individual Treatment Plan  Patient Details  Name: Amber Holder MRN: [id number] Date of Birth: [birth year] Referring Provider:   Flowsheet Row Pulmonary Rehab from 01/17/2024 in [facility] Cardiac and Pulmonary Rehab  Referring Provider Dr. Elfreda Bathe, MD    Initial Encounter Date:  Flowsheet Row Pulmonary Rehab from 01/17/2024 in [facility] Cardiac and Pulmonary Rehab  Date 01/17/24    Visit Diagnosis: Pulmonary artery hypertension (HCC)  Chronic obstructive pulmonary disease, unspecified COPD type (HCC)  Patient's Home Medications on Admission:  Current Outpatient Medications:    acetaminophen  (TYLENOL ) 500 MG chewable tablet, Chew 1,000 mg by mouth every 8 (eight) hours as needed for pain., Disp: , Rfl:    albuterol  (VENTOLIN  HFA) 108 (90 Base) MCG/ACT inhaler, Inhale 2 puffs into the lungs every 6 (six) hours as needed for wheezing or shortness of breath., Disp: 18 g, Rfl: 2   aspirin  EC 81 MG tablet, Take 1 tablet (81 mg total) by mouth daily. Swallow whole., Disp: 90 tablet, Rfl: 3   cholecalciferol  (VITAMIN D ) 400 UNITS TABS tablet, Take 2,000 Units by mouth daily., Disp: , Rfl:    CRANBERRY PO, Take 25,000 mg by mouth daily., Disp: , Rfl:    diclofenac  Sodium (VOLTAREN ) 1 % GEL, Apply small amount to area of pain daily as needed, Disp: 2 g, Rfl: 0   ergocalciferol  (DRISDOL ) 1.25 MG (50000 UT) capsule, Take one cap q week, Disp: 12 capsule, Rfl: 3   estradiol  (ESTRACE ) 0.1 MG/GM vaginal cream, Estrogen Cream Instruction Discard applicator Apply pea sized amount to tip of finger to urethra before bed. Wash hands well after application. Use Monday, Wednesday and Friday, Disp: 42.5 g, Rfl: 12   ezetimibe  (ZETIA ) 10 MG tablet, Take 1 tablet (10 mg total) by mouth daily., Disp: 90 tablet, Rfl: 3   FARXIGA  10 MG TABS tablet, TAKE 1 TABLET BY MOUTH DAILY BEFORE BREAKFAST., Disp: 90 tablet, Rfl: 3   fluconazole  (DIFLUCAN ) 150 MG tablet, Take one tab po q week for fungal infection and as  needed, Disp: 12 tablet, Rfl: 1   Fluticasone -Umeclidin-Vilant (TRELEGY ELLIPTA ) 100-62.5-25 MCG/ACT AEPB, Use inhale  orally daily, Disp: 3 each, Rfl: 4   glucose blood (ONETOUCH VERIO) test strip, BLOOD SUGAR TESTING ONCE DAILY . DX E11.65, Disp: 100 strip, Rfl: 3   hydrocortisone  cream 0.5 %, Apply topically as needed., Disp: 30 g, Rfl: 0   ibandronate  (BONIVA ) 150 MG tablet, TAKE 1 TABLET (150 MG TOTAL) BY MOUTH EVERY 30 (THIRTY) DAYS., Disp: 3 tablet, Rfl: 3   ketoconazole (NIZORAL) 2 % cream, Apply 1 Application topically daily., Disp: , Rfl:    Lancets (ONETOUCH DELICA PLUS LANCET30G) MISC, Use  as directed twice a daily DX E11.65, Disp: 100 each, Rfl: 3   macitentan  (OPSUMIT ) 10 MG tablet, Take 1 tablet (10 mg total) by mouth daily., Disp: 90 tablet, Rfl: 5   Magnesium  250 MG TABS, Take by mouth. Takes 1 tablet 2-3 times per week, Disp: , Rfl:    montelukast  (SINGULAIR ) 10 MG tablet, Take 1 tablet (10 mg total) by mouth daily as needed., Disp: 90 tablet, Rfl: 1   Multiple Vitamins-Minerals (MULTIVITAMIN ADULTS PO), Take 1 tablet by mouth daily., Disp: , Rfl:    omeprazole  (PRILOSEC) 40 MG capsule, TAKE 1 CAPSULE (40 MG TOTAL) BY MOUTH DAILY., Disp: 90 capsule, Rfl: 3   OXYGEN , Inhale 3 L into the lungs. Pt uses [facility] for Oxygen , Disp: , Rfl:    potassium chloride  SA (KLOR-CON  M) 20 MEQ  tablet, Take 2 tablets (40 mEq total) by mouth daily., Disp: 180 tablet, Rfl: 3   spironolactone  (ALDACTONE ) 25 MG tablet, Take 1 tablet (25 mg total) by mouth daily., Disp: 90 tablet, Rfl: 3   Torsemide  40 MG TABS, Take 40 mg by mouth daily., Disp: 90 tablet, Rfl: 3  Current Facility-Administered Medications:    nitrofurantoin  (macrocrystal-monohydrate) (MACROBID ) capsule 100 mg, 100 mg, Oral, Q12H, McDonough, Tinnie POUR, PA-C  Past Medical History: Past Medical History:  Diagnosis Date   Anxiety 01/02/2012   Arthritis    Asthma    Blockage of coronary artery of heart (HCC)    Chronic  kidney disease    Kidney stones   Colon polyps    COPD (chronic obstructive pulmonary disease) (HCC)    Diabetes mellitus without complication (HCC)    GERD (gastroesophageal reflux disease)    HOH (hard of hearing)    Bilateral hearing aids   Kidney stone    Osteoporosis    Sleep apnea    Does not use C-PAP on a regular basis   Urinary tract infection    Venous stasis     Tobacco Use: Social History   Tobacco Use  Smoking Status Former   Current packs/day: 0.00   Average packs/day: 1 pack/day for 50.0 years (50.0 ttl pk-yrs)   Types: Cigarettes   Start date: 12/29/1971   Quit date: 12/28/2021   Years since quitting: 2.3   Passive exposure: Never  Smokeless Tobacco Never    Labs: Review Flowsheet  More data exists      Latest Ref Rng & Units 07/07/2022 09/05/2022 06/11/2023 11/01/2023 03/06/2024  Labs for ITP Cardiac and Pulmonary Rehab  Cholestrol 0 - 200 mg/dL 827  - 830  - -  LDL (calc) 0 - 99 mg/dL 893  - 96  - -  Direct LDL 0 - 99 mg/dL - - 899  - -  HDL-C >59 mg/dL 45  - 57  - -  Trlycerides <150 mg/dL 895  - 79  - -  Hemoglobin A1c 4.0 - 5.6 % - 6.3  6.6  6.4  6.2      Pulmonary Assessment Scores:  Pulmonary Assessment Scores     Row Name 01/17/24 1149 05/16/24 1140       ADL UCSD   ADL Phase Entry --    SOB Score total 64 58    Rest 1 1    Walk 3 2    Stairs 4 5    Bath 3 2    Dress 2 2    Shop 3 3      CAT Score   CAT Score 14 14      mMRC Score   mMRC Score 2 --       UCSD: Self-administered rating of dyspnea associated with activities of daily living (ADLs) 6-point scale (0 = not at all to 5 = maximal or unable to do because of breathlessness)  Scoring Scores range from 0 to 120.  Minimally important difference is 5 units  CAT: CAT can identify the health impairment of COPD patients and is better correlated with disease progression.  CAT has a scoring range of zero to 40. The CAT score is classified into four groups of low (less than  10), medium (10 - 20), high (21-30) and very high (31-40) based on the impact level of disease on health status. A CAT score over 10 suggests significant symptoms.  A worsening CAT score could be explained by  an exacerbation, poor medication adherence, poor inhaler technique, or progression of COPD or comorbid conditions.  CAT MCID is 2 points  mMRC: mMRC (Modified Medical Research Council) Dyspnea Scale is used to assess the degree of baseline functional disability in patients of respiratory disease due to dyspnea. No minimal important difference is established. A decrease in score of 1 point or greater is considered a positive change.   Pulmonary Function Assessment:   Exercise Target Goals: Exercise Program Goal: Individual exercise prescription set using results from initial 6 min walk test and THRR while considering  patient's activity barriers and safety.   Exercise Prescription Goal: Initial exercise prescription builds to 30-45 minutes a day of aerobic activity, 2-3 days per week.  Home exercise guidelines will be given to patient during program as part of exercise prescription that the participant will acknowledge.  Education: Aerobic Exercise: - Group verbal and visual presentation on the components of exercise prescription. Introduces F.I.T.T principle from ACSM for exercise prescriptions.  Reviews F.I.T.T. principles of aerobic exercise including progression. Written material provided at class time. Flowsheet Row Pulmonary Rehab from 05/14/2024 in [facility] Cardiac and Pulmonary Rehab  Date 01/23/24  Educator NT  Instruction Review Code 1- Verbalizes Understanding    Education: Resistance Exercise: - Group verbal and visual presentation on the components of exercise prescription. Introduces F.I.T.T principle from ACSM for exercise prescriptions  Reviews F.I.T.T. principles of resistance exercise including progression. Written material provided at class time.    Education: Exercise  & Equipment Safety: - Individual verbal instruction and demonstration of equipment use and safety with use of the equipment. Flowsheet Row Pulmonary Rehab from 05/14/2024 in [facility] Cardiac and Pulmonary Rehab  Date 01/17/24  Educator NT  Instruction Review Code 1- Verbalizes Understanding    Education: Exercise Physiology & General Exercise Guidelines: - Group verbal and written instruction with models to review the exercise physiology of the cardiovascular system and associated critical values. Provides general exercise guidelines with specific guidelines to those with heart or lung disease.  Flowsheet Row Pulmonary Rehab from 08/23/2022 in [facility] Cardiac and Pulmonary Rehab  Date 06/28/22  Educator [facility]  Instruction Review Code 1- Verbalizes Understanding    Education: Flexibility, Balance, Mind/Body Relaxation: - Group verbal and visual presentation with interactive activity on the components of exercise prescription. Introduces F.I.T.T principle from ACSM for exercise prescriptions. Reviews F.I.T.T. principles of flexibility and balance exercise training including progression. Also discusses the mind body connection.  Reviews various relaxation techniques to help reduce and manage stress (i.e. Deep breathing, progressive muscle relaxation, and visualization). Balance handout provided to take home. Written material provided at class time.   Activity Barriers & Risk Stratification:  Activity Barriers & Cardiac Risk Stratification - 01/17/24 1150       Activity Barriers & Cardiac Risk Stratification   Activity Barriers Right Hip Replacement;Shortness of Breath;Muscular Weakness;Balance Concerns;Arthritis;Back Problems          6 Minute Walk:  6 Minute Walk     Row Name 01/17/24 1151 05/14/24 1138       6 Minute Walk   Phase Initial Discharge    Distance 1050 feet 1200 feet    Distance % Change -- 14.29 %    Distance Feet Change -- 150 ft    Walk Time 6 minutes 6 minutes    # of  Rest Breaks 0 0    MPH 1.99 2.27    METS 1.58 1.85    RPE 13 14    Perceived Dyspnea  2 3    VO2 Peak 5.51 6.48    Symptoms Yes (comment) Yes (comment)    Comments R leg weakness 2/10 dull R hip pain    Resting HR 62 bpm 72 bpm    Resting BP 132/64 110/60    Resting Oxygen  Saturation  93 % 88 %    Exercise Oxygen  Saturation  during 6 min walk 87 % 85 %    Max Ex. HR 84 bpm 92 bpm    Max Ex. BP 132/64 120/54    2 Minute Post BP 114/58 106/60      Interval HR   1 Minute HR 76 82    2 Minute HR 80 88    3 Minute HR 81 90    4 Minute HR 81 91    5 Minute HR 84 91    6 Minute HR 83 92    2 Minute Post HR 63 72    Interval Heart Rate? Yes Yes      Interval Oxygen    Interval Oxygen ? Yes Yes    Baseline Oxygen  Saturation % 93 % 88 %    1 Minute Oxygen  Saturation % 91 % 89 %    1 Minute Liters of Oxygen  2 L 3 L    2 Minute Oxygen  Saturation % 88 % 88 %    2 Minute Liters of Oxygen  2 L 3 L    3 Minute Oxygen  Saturation % 87 % 85 %    3 Minute Liters of Oxygen  2 L 3 L    4 Minute Oxygen  Saturation % 87 % 86 %    4 Minute Liters of Oxygen  2 L 3 L    5 Minute Oxygen  Saturation % 87 % 85 %    5 Minute Liters of Oxygen  2 L 3 L    6 Minute Oxygen  Saturation % 87 % 87 %    6 Minute Liters of Oxygen  2 L 3 L    2 Minute Post Oxygen  Saturation % 94 % 95 %    2 Minute Post Liters of Oxygen  2 L 3 L      Oxygen  Initial Assessment:  Oxygen  Initial Assessment - 01/15/24 1302       Home Oxygen    Home Oxygen  Device Home Concentrator;E-Tanks;Portable Concentrator    Sleep Oxygen  Prescription Continuous    Liters per minute 2    Home Exercise Oxygen  Prescription Continuous    Liters per minute 3   2-3   Home Resting Oxygen  Prescription Continuous    Liters per minute 2.5    Compliance with Home Oxygen  Use Yes          Oxygen  Re-Evaluation:  Oxygen  Re-Evaluation     Row Name 01/23/24 1134 03/17/24 1134 04/07/24 1148         Program Oxygen  Prescription   Program Oxygen   Prescription Continuous Continuous;E-Tanks Continuous;E-Tanks     Liters per minute 2 2 2        Home Oxygen    Home Oxygen  Device Home Concentrator;E-Tanks;Portable Concentrator Home Concentrator;E-Tanks;Portable Concentrator Home Concentrator;E-Tanks;Portable Concentrator     Sleep Oxygen  Prescription Continuous Continuous Continuous;CPAP  does not wear CPAP     Liters per minute 2 2 2      Home Exercise Oxygen  Prescription Continuous Continuous Continuous     Liters per minute 3 3 3      Home Resting Oxygen  Prescription Continuous Continuous Continuous     Liters per minute 2.5 2 2      Compliance with  Home Oxygen  Use Yes Yes Yes       Goals/Expected Outcomes   Short Term Goals To learn and demonstrate proper pursed lip breathing techniques or other breathing techniques.  To learn and demonstrate proper pursed lip breathing techniques or other breathing techniques.  To learn and understand importance of maintaining oxygen  saturations>88%;Other     Long  Term Goals Exhibits proper breathing techniques, such as pursed lip breathing or other method taught during program session Exhibits proper breathing techniques, such as pursed lip breathing or other method taught during program session Other;Maintenance of O2 saturations>88%     Comments Reviewed PLB technique with pt.  Talked about how it works and it's importance in maintaining their exercise saturations. Informed patient how to perform the Pursed Lipped breathing technique. Told patient to Inhale through the nose and out the mouth with pursed lips to keep their airways open, help oxygenate them better, practice when at rest or doing strenuous activity. Patient Verbalizes understanding of technique and will work on and be reiterated during [facility]. Amber Holder states that she feels more short of breath lately. She states she does not use her CPAP, wakes up in the morning with her oxygen  levels at 84 percent. Informed and explained the difference between  shortness of breath and oxygenation. Also informed patient to try to wear her CPAP if she can tolerate it.     Goals/Expected Outcomes Short: Become more profiecient at using PLB. Long: Become independent at using PLB. Short: use PLB with exertion. Long: use PLB on exertion proficiently and independently. Short: try to wear CPAP again. Long: maintain oxygen  saturation and sleep apnea independently.        Oxygen  Discharge (Final Oxygen  Re-Evaluation):  Oxygen  Re-Evaluation - 04/07/24 1148       Program Oxygen  Prescription   Program Oxygen  Prescription Continuous;E-Tanks    Liters per minute 2      Home Oxygen    Home Oxygen  Device Home Concentrator;E-Tanks;Portable Concentrator    Sleep Oxygen  Prescription Continuous;CPAP   does not wear CPAP   Liters per minute 2    Home Exercise Oxygen  Prescription Continuous    Liters per minute 3    Home Resting Oxygen  Prescription Continuous    Liters per minute 2    Compliance with Home Oxygen  Use Yes      Goals/Expected Outcomes   Short Term Goals To learn and understand importance of maintaining oxygen  saturations>88%;Other    Long  Term Goals Other;Maintenance of O2 saturations>88%    Comments Katreena states that she feels more short of breath lately. She states she does not use her CPAP, wakes up in the morning with her oxygen  levels at 84 percent. Informed and explained the difference between shortness of breath and oxygenation. Also informed patient to try to wear her CPAP if she can tolerate it.    Goals/Expected Outcomes Short: try to wear CPAP again. Long: maintain oxygen  saturation and sleep apnea independently.          Initial Exercise Prescription:  Initial Exercise Prescription - 01/17/24 1100       Date of Initial Exercise RX and Referring Provider   Date 01/17/24    Referring Provider Dr. Elfreda Bathe, MD      Oxygen    Oxygen  Continuous    Liters 2-3    Maintain Oxygen  Saturation 88% or higher      Treadmill   MPH 1.8     Grade 0    Minutes 15    METs 2.38  Recumbant Bike   Level 1    RPM 50    Watts 15    Minutes 15    METs 1.58      NuStep   Level 2    SPM 80    Minutes 15    METs 1.58      Prescription Details   Frequency (times per week) 2    Duration Progress to 30 minutes of continuous aerobic without signs/symptoms of physical distress      Intensity   THRR 40-80% of Max Heartrate 94-126    Ratings of Perceived Exertion 11-13    Perceived Dyspnea 0-4      Progression   Progression Continue to progress workloads to maintain intensity without signs/symptoms of physical distress.      Resistance Training   Training Prescription Yes    Weight 3 lb    Reps 10-15          Perform Capillary Blood Glucose checks as needed.  Exercise Prescription Changes:   Exercise Prescription Changes     Row Name 01/17/24 1100 01/30/24 1500 02/13/24 1500 02/28/24 1100 03/11/24 1100     Response to Exercise   Blood Pressure (Admit) 120/60 108/58 108/62 104/60 104/60   Blood Pressure (Exercise) 132/64 122/64 132/58 132/52 --   Blood Pressure (Exit) 114/58 116/58 104/56 108/64 104/72   Heart Rate (Admit) 62 bpm 73 bpm 68 bpm 74 bpm 65 bpm   Heart Rate (Exercise) 84 bpm 80 bpm 85 bpm 85 bpm 93 bpm   Heart Rate (Exit) 63 bpm 76 bpm 69 bpm 66 bpm 75 bpm   Oxygen  Saturation (Admit) 93 % 92 % 93 % 89 % 93 %   Oxygen  Saturation (Exercise) 87 % 88 % 88 % 88 % 89 %   Oxygen  Saturation (Exit) 94 % 89 % 91 % 94 % 90 %   Rating of Perceived Exertion (Exercise) 13 13 15 14 15    Perceived Dyspnea (Exercise) 2 -- 4 2 2    Symptoms R leg weakness none none none none   Comments Results first 2 weeks of exercise -- -- --   Duration -- Progress to 30 minutes of  aerobic without signs/symptoms of physical distress Progress to 30 minutes of  aerobic without signs/symptoms of physical distress Continue with 30 min of aerobic exercise without signs/symptoms of physical distress. Continue with 30 min of  aerobic exercise without signs/symptoms of physical distress.   Intensity -- THRR unchanged THRR unchanged THRR unchanged THRR unchanged     Progression   Progression -- Continue to progress workloads to maintain intensity without signs/symptoms of physical distress. Continue to progress workloads to maintain intensity without signs/symptoms of physical distress. Continue to progress workloads to maintain intensity without signs/symptoms of physical distress. Continue to progress workloads to maintain intensity without signs/symptoms of physical distress.   Average METs -- 2.44 2.4 2.69 2.52     Resistance Training   Training Prescription -- Yes Yes Yes Yes   Weight -- 3 lb 3 lb 3 lb 3 lb   Reps -- 10-15 10-15 10-15 10-15     Interval Training   Interval Training -- No No No No     Oxygen    Oxygen  -- Continuous Continuous Continuous Continuous   Liters -- 2-3 2-3 2-3 2-3L     Treadmill   MPH -- 1.8 1.8 1.8 2   Grade -- 0 0 1.5 0   Minutes -- 15 15 15 15    METs --  2.38 2.38 2.75 2.53     Recumbant Bike   Level -- -- 1.1 2.5 2.6   Watts -- -- 15 15 15    Minutes -- -- 15 15 15    METs -- -- 2.55 2.54 2.55     NuStep   Level -- 2 3 4 6    Minutes -- 15 15 15 15    METs -- 2.5 2.6 3.1 2     Oxygen    Maintain Oxygen  Saturation -- 88% or higher 88% or higher 88% or higher --    Row Name 03/24/24 1100 03/27/24 1400 04/09/24 1100 04/24/24 1100 05/08/24 1600     Response to Exercise   Blood Pressure (Admit) -- 122/60 108/60 104/60 104/60   Blood Pressure (Exit) -- 112/58 118/62 96/54 102/58   Heart Rate (Admit) -- 76 bpm 69 bpm 79 bpm 69 bpm   Heart Rate (Exercise) -- 92 bpm 93 bpm 81 bpm 82 bpm   Heart Rate (Exit) -- 83 bpm 72 bpm 80 bpm 71 bpm   Oxygen  Saturation (Admit) -- 90 % 92 % 90 % 92 %   Oxygen  Saturation (Exercise) -- 88 % 87 % 87 % 88 %   Oxygen  Saturation (Exit) -- 90 % 90 % 92 % 93 %   Rating of Perceived Exertion (Exercise) -- 15 15 15 15    Perceived Dyspnea  (Exercise) -- 3 3 4 3    Symptoms -- none none none none   Duration -- Continue with 30 min of aerobic exercise without signs/symptoms of physical distress. Continue with 30 min of aerobic exercise without signs/symptoms of physical distress. Continue with 30 min of aerobic exercise without signs/symptoms of physical distress. Continue with 30 min of aerobic exercise without signs/symptoms of physical distress.   Intensity -- THRR unchanged THRR unchanged THRR unchanged THRR unchanged     Progression   Progression -- Continue to progress workloads to maintain intensity without signs/symptoms of physical distress. Continue to progress workloads to maintain intensity without signs/symptoms of physical distress. Continue to progress workloads to maintain intensity without signs/symptoms of physical distress. Continue to progress workloads to maintain intensity without signs/symptoms of physical distress.   Average METs -- 2.4 2.83 2.94 3.08     Resistance Training   Training Prescription -- Yes Yes Yes Yes   Weight -- 3 lb 3 lb 3 lb 3 lb   Reps -- 10-15 10-15 10-15 10-15     Interval Training   Interval Training -- No No No No     Oxygen    Oxygen  -- Continuous Continuous Continuous Continuous   Liters -- 2-3L 3L 3L 3L     Treadmill   MPH -- 2 2 2 2    Grade -- 0.5 5 5 5    Minutes -- 15 15 15 15    METs -- 2.67 3.91 3.91 3.91     Recumbant Bike   Level -- 3 2.5 3 --   Watts -- 15 15 15  --   Minutes -- 15 15 15  --   METs -- 2.56 2.56 2.55 --     NuStep   Level -- 4 4 4 4    Minutes -- 15 15 15 15    METs -- 3.2 3.1 3.1 3.1     T5 Nustep   Level -- 3 -- -- --   Minutes -- 15 -- -- --   METs -- 1.8 -- -- --     Home Exercise Plan   Plans to continue exercise at [facility]  Marci plans to continue exercising at the [facility] for home exercise. She currently goes once a week and will increase to three times a week after graduation. [facility]  Stacee plans to continue  exercising at the [facility] for home exercise. She currently goes once a week and will increase to three times a week after graduation. [facility]  Malerie plans to continue exercising at the [facility] for home exercise. She currently goes once a week and will increase to three times a week after graduation. [facility]  Cinzia plans to continue exercising at the [facility] for home exercise. She currently goes once a week and will increase to three times a week after graduation. [facility]  Abbeygail plans to continue exercising at the [facility] for home exercise. She currently goes once a week and will increase to three times a week after graduation.   Frequency Add 3 additional days to program exercise sessions. Add 3 additional days to program exercise sessions. Add 3 additional days to program exercise sessions. Add 3 additional days to program exercise sessions. Add 3 additional days to program exercise sessions.   Initial Home Exercises Provided 03/24/24 03/24/24 03/24/24 03/24/24 03/24/24     Oxygen    Maintain Oxygen  Saturation -- 88% or higher 88% or higher 88% or higher 88% or higher    Row Name 05/20/24 1500             Response to Exercise   Blood Pressure (Admit) 110/60       Blood Pressure (Exit) 120/82       Heart Rate (Admit) 68 bpm       Heart Rate (Exercise) 85 bpm       Heart Rate (Exit) 73 bpm       Oxygen  Saturation (Admit) 88 %       Oxygen  Saturation (Exercise) 88 %       Oxygen  Saturation (Exit) 90 %       Rating of Perceived Exertion (Exercise) 15       Perceived Dyspnea (Exercise) 3       Symptoms none       Duration Continue with 30 min of aerobic exercise without signs/symptoms of physical distress.       Intensity THRR unchanged         Progression   Progression Continue to progress workloads to maintain intensity without signs/symptoms of physical distress.       Average METs 2.66         Resistance Training   Training Prescription Yes        Weight 3 lb       Reps 10-15         Interval Training   Interval Training No         Oxygen    Oxygen  Continuous       Liters 3L         Treadmill   MPH 2       Grade 4       Minutes 15       METs 3.63         Recumbant Bike   Level 3.5       Watts 15       Minutes 15       METs 2.56         NuStep   Level 4       Minutes 15       METs 2.5  Home Exercise Plan   Plans to continue exercise at [facility]  Deryn plans to continue exercising at the [facility] for home exercise. She currently goes once a week and will increase to three times a week after graduation.       Frequency Add 3 additional days to program exercise sessions.       Initial Home Exercises Provided 03/24/24         Oxygen    Maintain Oxygen  Saturation 88% or higher          Exercise Comments:   Exercise Comments     Row Name 01/23/24 1133           Exercise Comments First full day of exercise!  Patient was oriented to gym and equipment including functions, settings, policies, and procedures.  Patient's individual exercise prescription and treatment plan were reviewed.  All starting workloads were established based on the results of the 6 minute walk test done at initial orientation visit.  The plan for exercise progression was also introduced and progression will be customized based on patient's performance and goals.          Exercise Goals and Review:   Exercise Goals     Row Name 01/17/24 1150             Exercise Goals   Increase Physical Activity Yes       Intervention Provide advice, education, support and counseling about physical activity/exercise needs.;Develop an individualized exercise prescription for aerobic and resistive training based on initial evaluation findings, risk stratification, comorbidities and participant's personal goals.       Expected Outcomes Short Term: Attend rehab on a regular basis to increase amount of physical activity.;Long Term: Add in  home exercise to make exercise part of routine and to increase amount of physical activity.;Long Term: Exercising regularly at least 3-5 days a week.       Increase Strength and Stamina Yes       Intervention Provide advice, education, support and counseling about physical activity/exercise needs.;Develop an individualized exercise prescription for aerobic and resistive training based on initial evaluation findings, risk stratification, comorbidities and participant's personal goals.       Expected Outcomes Short Term: Increase workloads from initial exercise prescription for resistance, speed, and METs.;Short Term: Perform resistance training exercises routinely during rehab and add in resistance training at home;Long Term: Improve cardiorespiratory fitness, muscular endurance and strength as measured by increased METs and functional capacity ( )       Able to understand and use rate of perceived exertion (RPE) scale Yes       Intervention Provide education and explanation on how to use RPE scale       Expected Outcomes Short Term: Able to use RPE daily in rehab to express subjective intensity level;Long Term:  Able to use RPE to guide intensity level when exercising independently       Able to understand and use Dyspnea scale Yes       Intervention Provide education and explanation on how to use Dyspnea scale       Expected Outcomes Short Term: Able to use Dyspnea scale daily in rehab to express subjective sense of shortness of breath during exertion;Long Term: Able to use Dyspnea scale to guide intensity level when exercising independently       Knowledge and understanding of Target Heart Rate Range (THRR) Yes       Intervention Provide education and explanation of THRR including how the numbers were  predicted and where they are located for reference       Expected Outcomes Short Term: Able to state/look up THRR;Long Term: Able to use THRR to govern intensity when exercising independently;Short  Term: Able to use daily as guideline for intensity in rehab       Able to check pulse independently Yes       Intervention Provide education and demonstration on how to check pulse in carotid and radial arteries.;Review the importance of being able to check your own pulse for safety during independent exercise       Expected Outcomes Short Term: Able to explain why pulse checking is important during independent exercise;Long Term: Able to check pulse independently and accurately       Understanding of Exercise Prescription Yes       Intervention Provide education, explanation, and written materials on patient's individual exercise prescription       Expected Outcomes Short Term: Able to explain program exercise prescription;Long Term: Able to explain home exercise prescription to exercise independently          Exercise Goals Re-Evaluation :  Exercise Goals Re-Evaluation     Row Name 01/23/24 1135 01/30/24 1556 02/13/24 1518 02/28/24 1127 03/11/24 1127     Exercise Goal Re-Evaluation   Exercise Goals Review Able to understand and use rate of perceived exertion (RPE) scale;Able to understand and use Dyspnea scale;Knowledge and understanding of Target Heart Rate Range (THRR);Understanding of Exercise Prescription Increase Physical Activity;Increase Strength and Stamina;Understanding of Exercise Prescription Increase Physical Activity;Increase Strength and Stamina;Understanding of Exercise Prescription Increase Physical Activity;Increase Strength and Stamina;Understanding of Exercise Prescription Increase Physical Activity;Increase Strength and Stamina;Understanding of Exercise Prescription   Comments Reviewed RPE and dyspnea scale, THR and program prescription with pt today.  Pt voiced understanding and was given a copy of goals to take home. Amber Holder is off to a good start in the program. She was able to complete her first session during this review period. During her session she used the treadmill at a  speed of 1.82mph and no incline, and the T4 nustep at level 2. We will continue to monitor her progress in the program. Amber Holder is doing well in rehab. She has recently been able to increase from level 2 to 3 on the T4 nustep. She was also able to increase her speed on the treadmill to 1.53mph. We will contiue to monitor her progress in the program. Amber Holder continues to do well in rehab. She increased her treadmill workload by increasing her incline to 1.5% with a speed of 1.8 mph. She also improved to level 4 on the T4 nustep and level 2.5 on the recumbent bike. We will continue to monitor her progress in the program. Amber Holder is doing well in rehab. She was recently able to increase from level 4 to 6 on the T4 nustep. She was also able to use the treadmill at a speed of and no incline. We will continue to monitor her progress in the program.   Expected Outcomes Short: Use RPE daily to regulate intensity. Long: Follow program prescription in THR. Short: Continue to follow exercise prescription. Long: Continue exercise to improve strength and stamina. Short: Continue to follow exercise prescription. Long: Continue exercise to improve strength and stamina. Short: Continue to progressively increase workloads. Long: Continue exercise to improve strength and stamina. Short: Continue to progressively increase workloads. Long: Continue exercise to improve strength and stamina.    Row Name 03/24/24 1139 03/27/24 1406 04/07/24 1130 04/09/24 1140  04/24/24 1104     Exercise Goal Re-Evaluation   Exercise Goals Review Increase Physical Activity;Able to understand and use Dyspnea scale;Understanding of Exercise Prescription;Knowledge and understanding of Target Heart Rate Range (THRR);Increase Strength and Stamina;Able to check pulse independently;Able to understand and use rate of perceived exertion (RPE) scale Increase Physical Activity;Increase Strength and Stamina;Understanding of Exercise Prescription Increase Physical  Activity;Increase Strength and Stamina;Understanding of Exercise Prescription Increase Physical Activity;Increase Strength and Stamina;Understanding of Exercise Prescription Increase Physical Activity;Increase Strength and Stamina;Understanding of Exercise Prescription   Comments Reviewed home exercise with pt today from 11:25 to 11:40.  Pt plans to continue [facility].  Reviewed THR, pulse, RPE, sign and symptoms, pulse oximetery and when to call 911 or MD.  Also discussed weather considerations and indoor options.  Pt voiced understanding. Amber Holder is doing well in rehab. She was recently able to increase her workload on the treadmill to a speed of and an incline of 0.5%. She was also able to increase from level 2.6 to 3 on the recumbent bike. We will continue to monitor her progress in the program. Follow up on home exercise was done with patient. She reports that she is trying to do hand weight and stretching exercises on days that she is not in class. [facility] also belongs [facility] and exercises on Fridays there when she is not in pulmonary rehab. She is gett in 4-5 days a week of exericse total. Amber Holder continues to do well in rehab. She increased her workload on the treadmill to a speed of 2 mph and incline of 5%. She maintained level 4 on the T4 nustep. She worked at level 2.5 on the recumbent bike. We will continue to monitor her progress in the program. Amber Holder continues to do well in rehab. She increased to level 3 on the recumbent bike. She maintained her workload on the treadmill with a speed of 2 mph and 5% incline. She also maintained level 4 on the T4 nustep. We will continue to monitor her progress in the program.   Expected Outcomes Short: Continue to exercise at home. Long: Continue exercise to improve strength and stamina. Short: Continue to exercise treadmill workload. Long: Continue exercise to improve strength and stamina. Short: continue to exercise  1-2 days a week at home or at the [facility] while continuing pulmonary rehab. Long: maintain independent exercise routine upon graduation from rehab. Short: Try level 5 on the T4 nustep. Long: Continue exercise to improve strength and stamina. Short: Try level 5 on the T4 nustep and progressively increase workload on the treadmill. Long: Continue exercise to improve strength and stamina.    Row Name 05/08/24 1652 05/12/24 1127 05/20/24 1517         Exercise Goal Re-Evaluation   Exercise Goals Review Increase Physical Activity;Increase Strength and Stamina;Understanding of Exercise Prescription Increase Physical Activity;Increase Strength and Stamina;Understanding of Exercise Prescription Increase Physical Activity;Understanding of Exercise Prescription;Increase Strength and Stamina     Comments Amber Holder continues to do well in rehab. She is due for her post soon and hopes to improve. She has maintained her workload on the treadmill with a speed of 2 mph and incline of 5%. She maintained level 4 on the T4 nustep. We will continue to monitor her progress in the program. Amber Holder will graduate from rehab in the next few weeks. She plans to continue her exercise at the [facility] once she graduates from pulmonary rehab. She did not  want to do her 6 MWT today due to just getting over a cold but she plans to do her 6 MWT on her next appointment. Amber Holder continues to do well in rehab and will graduate very soon. She completed her post and improved by 14.29% with 1225ft. She increased to level 3.5 on the recumbent bike. She maintained level 4 on the T4 nustep. She dropped her incline to 4% on the treadmill with keeping her speed of 2 mph. We will continue to monitor her progress in the program until graduation.     Expected Outcomes Short: Improve on post . Long: Continue to increase overall METs and stamina. Short: Improve on post . Graduate.  Long: maintain independent exercise routine upon  graduation from pulmonary rehab. Short: Graduate. Long: Continue to exercise independently.        Discharge Exercise Prescription (Final Exercise Prescription Changes):  Exercise Prescription Changes - 05/20/24 1500       Response to Exercise   Blood Pressure (Admit) 110/60    Blood Pressure (Exit) 120/82    Heart Rate (Admit) 68 bpm    Heart Rate (Exercise) 85 bpm    Heart Rate (Exit) 73 bpm    Oxygen  Saturation (Admit) 88 %    Oxygen  Saturation (Exercise) 88 %    Oxygen  Saturation (Exit) 90 %    Rating of Perceived Exertion (Exercise) 15    Perceived Dyspnea (Exercise) 3    Symptoms none    Duration Continue with 30 min of aerobic exercise without signs/symptoms of physical distress.    Intensity THRR unchanged      Progression   Progression Continue to progress workloads to maintain intensity without signs/symptoms of physical distress.    Average METs 2.66      Resistance Training   Training Prescription Yes    Weight 3 lb    Reps 10-15      Interval Training   Interval Training No      Oxygen    Oxygen  Continuous    Liters 3L      Treadmill   MPH 2    Grade 4    Minutes 15    METs 3.63      Recumbant Bike   Level 3.5    Watts 15    Minutes 15    METs 2.56      NuStep   Level 4    Minutes 15    METs 2.5      Home Exercise Plan   Plans to [facility] exercise at [facility] plans to continue exercising [facility] for home exercise. She currently goes once a week and will increase to three times a week after graduation.   Frequency Add 3 additional days to program exercise sessions.    Initial Home Exercises Provided 03/24/24      Oxygen    Maintain Oxygen  Saturation 88% or higher          Nutrition:  Target Goals: Understanding of nutrition guidelines, daily intake of sodium 1500mg , cholesterol 200mg , calories 30% from fat and 7% or less from saturated fats, daily to have 5 or more servings of fruits and  vegetables.  Education: Nutrition 1 -Group instruction provided by verbal, written material, interactive activities, discussions, models, and posters to present general guidelines for heart healthy nutrition including macronutrients, label reading, and promoting whole foods over processed counterparts. Education serves as Pensions consultant of discussion of heart healthy eating for all. Written material provided at class  time.     Education: Nutrition 2 -Group instruction provided by verbal, written material, interactive activities, discussions, models, and posters to present general guidelines for heart healthy nutrition including sodium, cholesterol, and saturated fat. Providing guidance of habit forming to improve blood pressure, cholesterol, and body weight. Written material provided at class time.     Biometrics:  Pre Biometrics - 01/17/24 1151       Pre Biometrics   Height 5' 4.25 (1.632 m)    Weight 188 lb 14.4 oz (85.7 kg)    Waist Circumference 40 inches    Hip Circumference 45 inches    Waist to Hip Ratio 0.89 %    BMI (Calculated) 32.17    Single Leg Stand 1.6 seconds          Post Biometrics - 05/14/24 1141        Post  Biometrics   Height 5' 4.25 (1.632 m)    Weight 187 lb (84.8 kg)    Waist Circumference 39.6 inches    Hip Circumference 43 inches    Waist to Hip Ratio 0.92 %    BMI (Calculated) 31.85    Single Leg Stand 1.6 seconds          Nutrition Therapy Plan and Nutrition Goals:  Nutrition Therapy & Goals - 01/17/24 1124       Nutrition Therapy   Diet carb controlled, Cardiac Low Na    Protein (specify units) 90    Fiber 25 grams    Whole Grain Foods 3 servings    Saturated Fats 15 max. grams    Fruits and Vegetables 5 servings/day    Sodium 2 grams      Personal Nutrition Goals   Nutrition Goal Eat 15-30gProtein and 30-60gCarbs at each meal.    Personal Goal #2 Read labels and reduce sodium intake to below 2300mg . Ideally 1500mg  per day.     Comments Patient drinking mostly water, getting ~40-64oz daily. She says she usually eats 3 meals per day. Reviewed her food recall, educated on importance of pairing quality carbs with protein and healthy fat. She already monitors her sodium intake, says she doesn't cook or use it at the table. Encouraged her to read labels and try and keep sodium below 1500mg . Provided Mediterranean diet handout. Educated on types of fats, sources, and how to read labels. Brainstormed several meals and snack ideas and went over sample plate handout.      Intervention Plan   Intervention Prescribe, educate and counsel regarding individualized specific dietary modifications aiming towards targeted core components such as weight, hypertension, lipid management, diabetes, heart failure and other comorbidities.;Nutrition handout(s) given to patient.    Expected Outcomes Short Term Goal: Understand basic principles of dietary content, such as calories, fat, sodium, cholesterol and nutrients.;Short Term Goal: A plan has been developed with personal nutrition goals set during dietitian appointment.;Long Term Goal: Adherence to prescribed nutrition plan.          Nutrition Assessments:  MEDIFICTS Score Key: >=70 Need to make dietary changes  40-70 Heart Healthy Diet <= 40 Therapeutic Level Cholesterol Diet  Flowsheet Row Documentation from 05/16/2024 in [facility] Card[facility]  Picture Your Plate Total Score on Discharge 65   Picture Your Plate Scores: <59 Unhealthy dietary pattern with much room for improvement. 41-50 Dietary pattern unlikely to meet recommendations for good health and room for improvement. 51-60 More healthful dietary pattern, with some room for improvement.  >60 Healthy dietary pattern, although there may be  some specific behaviors that could be improved.   Nutrition Goals Re-Evaluation:  Nutrition Goals Re-Evaluation     Row Name 03/17/24 1133 04/07/24 1154           Goals    Comment Patient was informed on why it is important to maintain a balanced diet when dealing with Respiratory issues. Explained that it takes a lot of energy to breath and when they are short of breath often they will need to have a good diet to help keep up with the calories they are expending for breathing. Amber Holder states that she has cut out sweets and has been improving her diet. She is eating more protiens and vegetables.      Expected Outcome Short: Choose and plan snacks accordingly to patients caloric intake to improve breathing. Long: Maintain a diet independently that meets their caloric intake to aid in daily shortness of breath. Short: eat smaller portions. Long: adhere to a diet that pertains to her.         Nutrition Goals Discharge (Final Nutrition Goals Re-Evaluation):  Nutrition Goals Re-Evaluation - 04/07/24 1154       Goals   Comment Amber Holder states that she has cut out sweets and has been improving her diet. She is eating more protiens and vegetables.    Expected Outcome Short: eat smaller portions. Long: adhere to a diet that pertains to her.          Psychosocial: Target Goals: Acknowledge presence or absence of significant depression and/or stress, maximize coping skills, provide positive support system. Participant is able to verbalize types and ability to use techniques and skills needed for reducing stress and depression.   Education: Stress, Anxiety, and Depression - Group verbal and visual presentation to define topics covered.  Reviews how body is impacted by stress, anxiety, and depression.  Also discusses healthy ways to reduce stress and to treat/manage anxiety and depression.  Written material provided at class time. Flowsheet Row Pulmonary Rehab from 08/23/2022 in [facility]  Date 06/21/22  Educator [facility]  Instruction Review Code 1- [employer] Understanding    Education: Sleep Hygiene -Provides group verbal and written instruction about how  sleep can affect your health.  Define sleep hygiene, discuss sleep cycles and impact of sleep habits. Review good sleep hygiene tips.    Initial Review & Psychosocial Screening:  Initial Psych Review & Screening - 01/15/24 1309       Initial Review   Current issues with Current Depression      Family Dynamics   Good Support System? Yes   friends     Barriers   Psychosocial barriers to participate in program The patient should benefit from training in stress management and relaxation.;There are no identifiable barriers or psychosocial needs.      Screening Interventions   Interventions Encouraged to exercise;To provide support and resources with identified psychosocial needs    Expected Outcomes Short Term goal: Utilizing psychosocial counselor, staff and physician to assist with identification of specific Stressors or current issues interfering with healing process. Setting desired goal for each stressor or current issue identified.;Long Term Goal: Stressors or current issues are controlled or eliminated.;Short Term goal: Identification and review with participant of any Quality of Life or Depression concerns found by scoring the questionnaire.;Long Term goal: The participant improves quality of Life and PHQ9 Scores as seen by post scores and/or verbalization of changes          Quality of Life Scores:  Scores  of 19 and below usually indicate a poorer quality of life in these areas.  A difference of  2-3 points is a clinically meaningful difference.  A difference of 2-3 points in the total score of the Quality of Life Index has been associated with significant improvement in overall quality of life, self-image, physical symptoms, and general health in studies assessing change in quality of life.  PHQ-9: Review Flowsheet  More data exists      05/16/2024 03/06/2024 01/17/2024 11/01/2023 06/07/2023  Depression screen PHQ 2/9  Decreased Interest 1 0 0 0 0  Down, Depressed, Hopeless 1 0 1 0  0  PHQ - 2 Score 2 0 1 0 0  Altered sleeping 1 - 0 - -  Tired, decreased energy 2 - 2 - -  Change in appetite 0 - 0 - -  Feeling bad or failure about yourself  0 - 0 - -  Trouble concentrating 0 - 0 - -  Moving slowly or fidgety/restless 0 - 0 - -  Suicidal thoughts 0 - 0 - -  PHQ-9 Score 5 - 3 - -  Difficult doing work/chores Not difficult at all - Not difficult at all - -   Interpretation of Total Score  Total Score Depression Severity:  1-4 = Minimal depression, 5-9 = Mild depression, 10-14 = Moderate depression, 15-19 = Moderately severe depression, 20-27 = Severe depression   Psychosocial Evaluation and Intervention:  Psychosocial Evaluation - 01/15/24 1324       Psychosocial Evaluation & Interventions   Comments Amber Holder is returning to pulmonary rehab with pulmonary hypertension. She states she has been working on regulating her medications and prioritizing fluid balance. She wants to attend to work on her stamina and her breathing. She has a history of depression and mentions that its a daily choice on how she is going to feel. She does have friends that help her as well as a monthly call from a support system through her insurance.    Expected Outcomes Short: attend pulmonary rehab for education and exercise Long: develop and maintain positive self care habits.    Continue Psychosocial Services  Follow up required by staff          Psychosocial Re-Evaluation:  Psychosocial Re-Evaluation     Row Name 03/17/24 1137 04/07/24 1158           Psychosocial Re-Evaluation   Current issues with None Identified Current Stress Concerns      Comments Patient reports no issues with their current mental states, sleep, stress, depression or anxiety. Will follow up with patient in a few weeks for any changes. Cleona states that her stressors stem from her breathing condition. She feels like she has been slowly going downhill. Her requirement for oxygen  is increasing.      Expected  Outcomes Short: Continue to exercise regularly to support mental health and notify staff of any changes. Long: maintain mental health and well being through teaching of rehab or prescribed medications independently. Short: continue lungworks to aid with stress. Long: maintain a stress free environment.      Interventions Encouraged to attend Pulmonary Rehabilitation for the exercise Encouraged to attend Pulmonary Rehabilitation for the exercise      Continue Psychosocial Services  Follow up required by staff Follow up required by staff         Psychosocial Discharge (Final Psychosocial Re-Evaluation):  Psychosocial Re-Evaluation - 04/07/24 1158       Psychosocial Re-Evaluation   Current issues with Current  Stress Concerns    Comments Amber Holder states that her stressors stem from her breathing condition. She feels like she has been slowly going downhill. Her requirement for oxygen  is increasing.    Expected Outcomes Short: continue lungworks to aid with stress. Long: maintain a stress free environment.    Interventions Encouraged to attend Pulmonary Rehabilitation for the exercise    Continue Psychosocial Services  Follow up required by staff          Education: Education Goals: Education classes will be provided on a weekly basis, covering required topics. Participant will state understanding/return demonstration of topics presented.  Learning Barriers/Preferences:   General Pulmonary Education Topics:  Infection Prevention: - Provides verbal and written material to individual with discussion of infection control including proper hand washing and proper equipment cleaning during exercise session. Flowsheet Row Pulmonary Rehab from 05/14/2024 in [facility] Cardiac and Pulmonary Rehab  Date 01/17/24  Educator NT  Instruction Review Code 1- Verbalizes Understanding    Falls Prevention: - Provides verbal and written material to individual with discussion of falls prevention and  safety. Flowsheet Row Pulmonary Rehab from 05/14/2024 in [facility] Cardiac and Pulmonary Rehab  Date 01/17/24  Educator NT  Instruction Review Code 1- Verbalizes Understanding    Chronic Lung Disease Review: - Group verbal instruction with posters, models, PowerPoint presentations and videos,  to review new updates, new respiratory medications, new advancements in procedures and treatments. Providing information on websites and 800 numbers for continued self-education. Includes information about supplement oxygen , available portable oxygen  systems, continuous and intermittent flow rates, oxygen  safety, concentrators, and Medicare reimbursement for oxygen . Explanation of Pulmonary Drugs, including class, frequency, complications, importance of spacers, rinsing mouth after steroid MDI's, and proper cleaning methods for nebulizers. Review of basic lung anatomy and physiology related to function, structure, and complications of lung disease. Review of risk factors. Discussion about methods for diagnosing sleep apnea and types of masks and machines for OSA. Includes a review of the use of types of environmental controls: home humidity, furnaces, filters, dust mite/pet prevention, HEPA vacuums. Discussion about weather changes, air quality and the benefits of nasal washing. Instruction on Warning signs, infection symptoms, calling MD promptly, preventive modes, and value of vaccinations. Review of effective airway clearance, coughing and/or vibration techniques. Emphasizing that all should Create an Action Plan. Written material provided at class time. Flowsheet Row Pulmonary Rehab from 05/14/2024 in [facility] Cardiac and Pulmonary Rehab  Education need identified 01/17/24  Date 05/14/24  Educator jh  Instruction Review Code 1- Verbalizes Understanding    AED/CPR: - Group verbal and written instruction with the use of models to demonstrate the basic use of the AED with the basic ABC's of resuscitation.    Tests  and Procedures:  - Group verbal and visual presentation and models provide information about basic cardiac anatomy and function. Reviews the testing methods done to diagnose heart disease and the outcomes of the test results. Describes the treatment choices: Medical Management, Angioplasty, or Coronary Bypass Surgery for treating various heart conditions including Myocardial Infarction, Angina, Valve Disease, and Cardiac Arrhythmias.  Written material provided at class time. Flowsheet Row Pulmonary Rehab from 05/14/2024 in [facility] Cardiac and Pulmonary Rehab  Date 02/13/24  Educator [facility]  Instruction Review Code 1- Verbalizes Understanding    Medication Safety: - Group verbal and visual instruction to review commonly prescribed medications for heart and lung disease. Reviews the medication, class of the drug, and side effects. Includes the steps to properly store meds and maintain the  prescription regimen.  Written material given at graduation. Flowsheet Row Pulmonary Rehab from 05/14/2024 in [facility] Cardiac and Pulmonary Rehab  Date 02/20/24  Educator sb  Instruction Review Code 1- Verbalizes Understanding    Other: -Provides group and verbal instruction on various topics (see comments)   Knowledge Questionnaire Score:  Knowledge Questionnaire Score - 05/16/24 1144       Knowledge Questionnaire Score   Pre Score 13/18    Post Score 16/18           Core Components/Risk Factors/Patient Goals at Admission:  Personal Goals and Risk Factors at Admission - 01/15/24 1307       Core Components/Risk Factors/Patient Goals on Admission    Weight Management Yes;Weight Loss   fluid balance   Intervention Weight Management: Develop a combined nutrition and exercise program designed to reach desired caloric intake, while maintaining appropriate intake of nutrient and fiber, sodium and fats, and appropriate energy expenditure required for the weight goal.;Weight Management: Provide education and  appropriate resources to help participant work on and attain dietary goals.;Weight Management/Obesity: Establish reasonable short term and long term weight goals.;Obesity: Provide education and appropriate resources to help participant work on and attain dietary goals.    Expected Outcomes Short Term: Continue to assess and modify interventions until short term weight is achieved;Long Term: Adherence to nutrition and physical activity/exercise program aimed toward attainment of established weight goal;Weight Loss: Understanding of general recommendations for a balanced deficit meal plan, which promotes 1-2 lb weight loss per week and includes a negative energy balance of [id number] kcal/d;Understanding recommendations for meals to include 15-35% energy as protein, 25-35% energy from fat, 35-60% energy from carbohydrates, less than 200mg  of dietary cholesterol, 20-35 gm of total fiber daily;Understanding of distribution of calorie intake throughout the day with the consumption of 4-5 meals/snacks    Improve shortness of breath with ADL's Yes    Intervention Provide education, individualized exercise plan and daily activity instruction to help decrease symptoms of SOB with activities of daily living.    Expected Outcomes Short Term: Improve cardiorespiratory fitness to achieve a reduction of symptoms when performing ADLs;Long Term: Be able to perform more ADLs without symptoms or delay the onset of symptoms    Diabetes Yes    Intervention Provide education about signs/symptoms and action to take for hypo/hyperglycemia.;Provide education about proper nutrition, including hydration, and aerobic/resistive exercise prescription along with prescribed medications to achieve blood glucose in normal ranges: Fasting glucose 65-99 mg/dL    Expected Outcomes Short Term: Participant verbalizes understanding of the signs/symptoms and immediate care of hyper/hypoglycemia, proper foot care and importance of medication,  aerobic/resistive exercise and nutrition plan for blood glucose control.;Long Term: Attainment of HbA1C < 7%.    Heart Failure Yes    Intervention Provide a combined exercise and nutrition program that is supplemented with education, support and counseling about heart failure. Directed toward relieving symptoms such as shortness of breath, decreased exercise tolerance, and extremity edema.    Expected Outcomes Improve functional capacity of life;Short term: Attendance in program 2-3 days a week with increased exercise capacity. Reported lower sodium intake. Reported increased fruit and vegetable intake. Reports medication compliance.;Short term: Daily weights obtained and reported for increase. Utilizing diuretic protocols set by physician.;Long term: Adoption of self-care skills and reduction of barriers for early signs and symptoms recognition and intervention leading to self-care maintenance.          Education:Diabetes - Individual verbal and written instruction to review signs/symptoms of diabetes, desired  ranges of glucose level fasting, after meals and with exercise. Acknowledge that pre and post exercise glucose checks will be done for 3 sessions at entry of program. Flowsheet Row Pulmonary Rehab from 08/23/2022 in [facility] Cardiac and Pulmonary Rehab  Date 05/15/22  Educator [facility]  Instruction Review Code 1- Verbalizes Understanding    Know Your Numbers and Heart Failure: - Group verbal and visual instruction to discuss disease risk factors for cardiac and pulmonary disease and treatment options.  Reviews associated critical values for Overweight/Obesity, Hypertension, Cholesterol, and Diabetes.  Discusses basics of heart failure: signs/symptoms and treatments.  Introduces Heart Failure Zone chart for action plan for heart failure. Written material provided at class time. Flowsheet Row Pulmonary Rehab from 05/14/2024 in [facility] Cardiac and Pulmonary Rehab  Date 05/07/24  Educator kb  Instruction  Review Code 1- Verbalizes Understanding    Core Components/Risk Factors/Patient Goals Review:   Goals and Risk Factor Review     Row Name 03/17/24 1133 04/07/24 1153           Core Components/Risk Factors/Patient Goals Review   Personal Goals Review Improve shortness of breath with ADL's Weight Management/Obesity      Review Spoke to patient about their shortness of breath and what they can do to improve. Patient has been informed of breathing techniques when starting the program. Patient is informed to tell staff if they have had any med changes and that certain meds they are taking or not taking can be causing shortness of breath. Amber Holder states she would like to lose some weight and has been trying to changer her diet more. She does not use her CPAP routinely which could hinder her losing weight.      Expected Outcomes Short: Attend [facility] regularly to improve shortness of breath with ADL's. Long: maintain independence with ADL's Short: try to use CPAP. Long: reach her weight goal.         Core Components/Risk Factors/Patient Goals at Discharge (Final Review):   Goals and Risk Factor Review - 04/07/24 1153       Core Components/Risk Factors/Patient Goals Review   Personal Goals Review Weight Management/Obesity    Review Amber Holder states she would like to lose some weight and has been trying to changer her diet more. She does not use her CPAP routinely which could hinder her losing weight.    Expected Outcomes Short: try to use CPAP. Long: reach her weight goal.          ITP Comments:  ITP Comments     Row Name 01/15/24 1320 01/17/24 1145 01/23/24 1133 01/30/24 1050 02/27/24 1055   ITP Comments Initial phone call completed. Diagnosis can be found in [facility] 5/12. EP Orientation scheduled for Thursday 5/22 at 9:30am. Completed and gym orientation for respiratory care services. Initial ITP created and sent for review to Dr. Faud Aleskerov, Medical Director. First full day of  exercise!  Patient was oriented to gym and equipment including functions, settings, policies, and procedures.  Patient's individual exercise prescription and treatment plan were reviewed.  All starting workloads were established based on the results of the 6 minute walk test done at initial orientation visit.  The plan for exercise progression was also introduced and progression will be customized based on patient's performance and goals. 30 Day review completed. Medical Director ITP review done, changes made as directed, and signed approval by Medical Director.    new to program 30 Day review completed. Medical Director ITP review done, changes made as  directed, and signed approval by Medical Director.    Row Name 03/26/24 1005 04/23/24 1024 05/21/24 1111       ITP Comments 30 Day review completed. Medical Director ITP review done, changes made as directed, and signed approval by Medical Director. 30 Day review completed. Medical Director ITP review done, changes made as directed, and signed approval by Medical Director. 30 Day review completed. Medical Director ITP review done, changes made as directed, and signed approval by Medical Director.        Comments: 30 day review

## 2024-05-26 ENCOUNTER — Encounter

## 2024-05-26 DIAGNOSIS — I2721 Secondary pulmonary arterial hypertension: Secondary | ICD-10-CM

## 2024-05-26 NOTE — Progress Notes (Signed)
 Daily Session Note  Patient Details  Name: Amber Holder MRN: [id number] Date of Birth: 1946-04-27 Referring Provider:   Flowsheet Row Pulmonary Rehab from 01/17/2024 in [facility] Cardiac and Pulmonary Rehab  Referring Provider Dr. Elfreda Bathe, MD    Encounter Date: 05/26/2024  Check In:  Session Check In - 05/26/24 1058       Check-In   Supervising physician immediately available to respond to emergencies See telemetry face sheet for immediately available ER MD    Location [facility]-Cardiac & Pulmonary Rehab    Staff Present Burnard Davenport [facility] Peggi, RN, DNP, NE-BC;Laura Cates RN,BSN;Joseph [facility] BS, ACSM CEP, Exercise Physiologist    Virtual Visit No    Medication changes reported     No    Fall or balance concerns reported    No    Tobacco Cessation No Change    Warm-up and Cool-down Performed on first and last piece of equipment    Resistance Training Performed Yes    VAD Patient? No    PAD/SET Patient? No      Pain Assessment   Currently in Pain? No/denies             Social History   Tobacco Use  Smoking Status Former   Current packs/day: 0.00   Average packs/day: 1 pack/day for 50.0 years (50.0 ttl pk-yrs)   Types: Cigarettes   Start date: 12/29/1971   Quit date: 12/28/2021   Years since quitting: 2.4   Passive exposure: Never  Smokeless Tobacco Never    Goals Met:  Proper associated with RPD/PD & O2 Sat Independence with exercise equipment Using PLB without cueing & demonstrates good technique Exercise tolerated well No report of concerns or symptoms today Strength training completed today  Goals Unmet:  Not Applicable  Comments: Pt able to follow exercise prescription today without complaint.  Will continue to monitor for progression.    Dr. Oneil Pinal is Medical Director for [facility] Cardiac Rehabilitation.  Dr. Fuad Aleskerov is Medical Director for [facility] Pulmonary Rehabilitation.

## 2024-05-28 ENCOUNTER — Encounter: Attending: Internal Medicine

## 2024-05-28 DIAGNOSIS — I2721 Secondary pulmonary arterial hypertension: Secondary | ICD-10-CM | POA: Insufficient documentation

## 2024-05-28 NOTE — Progress Notes (Signed)
 Daily Session Note  Patient Details  Name: Amber Holder MRN: [id number] Date of Birth: December 19, 1945 Referring Provider:   Flowsheet Row Pulmonary Rehab from 01/17/2024 in [facility] Cardiac and Pulmonary Rehab  Referring Provider Dr. Elfreda Bathe, MD    Encounter Date: 05/28/2024  Check In:  Session Check In - 05/28/24 1135       Check-In   Supervising physician immediately available to respond to emergencies See telemetry face sheet for immediately available ER MD    Location [facility]-Cardiac & Pulmonary Rehab    Staff Present Burnard Davenport [facility] Peggi, RN, DNP, NE-BC;Joseph [facility] RN,BSN;Margaret Best, MS, Exercise Physiologist    Virtual Visit No    Medication changes reported     No    Fall or balance concerns reported    No    Tobacco Cessation No Change    Warm-up and Cool-down Performed on first and last piece of equipment    Resistance Training Performed Yes    VAD Patient? No    PAD/SET Patient? No      Pain Assessment   Currently in Pain? No/denies             Social History   Tobacco Use  Smoking Status Former   Current packs/day: 0.00   Average packs/day: 1 pack/day for 50.0 years (50.0 ttl pk-yrs)   Types: Cigarettes   Start date: 12/29/1971   Quit date: 12/28/2021   Years since quitting: 2.4   Passive exposure: Never  Smokeless Tobacco Never    Goals Met:  Proper associated with RPD/PD & O2 Sat Independence with exercise equipment Using PLB without cueing & demonstrates good technique Exercise tolerated well No report of concerns or symptoms today Strength training completed today  Goals Unmet:  Not Applicable  Comments: Pt able to follow exercise prescription today without complaint.  Will continue to monitor for progression.    Dr. Oneil Pinal is Medical Director for [facility] Cardiac Rehabilitation.  Dr. Fuad Aleskerov is Medical Director for [facility] Pulmonary Rehabilitation.

## 2024-05-29 ENCOUNTER — Other Ambulatory Visit: Payer: Self-pay | Admitting: Cardiology

## 2024-06-02 ENCOUNTER — Encounter

## 2024-06-02 DIAGNOSIS — I2721 Secondary pulmonary arterial hypertension: Secondary | ICD-10-CM | POA: Diagnosis not present

## 2024-06-02 NOTE — Progress Notes (Signed)
 Daily Session Note  Patient Details  Name: Amber Holder MRN: [id number] Date of Birth: 1945-10-09 Referring Provider:   Flowsheet Row Pulmonary Rehab from 01/17/2024 in [facility] Cardiac and Pulmonary Rehab  Referring Provider Dr. Elfreda Bathe, MD    Encounter Date: 06/02/2024  Check In:  Session Check In - 06/02/24 1108       Check-In   Supervising physician immediately available to respond to emergencies See telemetry face sheet for immediately available ER MD    Location [facility]-Cardiac & Pulmonary Rehab    Staff Present Burnard Davenport RN,BSN,MPA;Joseph Rolinda RCP,RRT,BSRT;Laura Cates RN,BSN;Breyonna Nault Dyane BS, ACSM CEP, Exercise Physiologist    Virtual Visit No    Medication changes reported     No    Fall or balance concerns reported    No    Tobacco Cessation No Change    Warm-up and Cool-down Performed on first and last piece of equipment    Resistance Training Performed Yes    VAD Patient? No    PAD/SET Patient? No      Pain Assessment   Currently in Pain? No/denies             Social History   Tobacco Use  Smoking Status Former   Current packs/day: 0.00   Average packs/day: 1 pack/day for 50.0 years (50.0 ttl pk-yrs)   Types: Cigarettes   Start date: 12/29/1971   Quit date: 12/28/2021   Years since quitting: 2.4   Passive exposure: Never  Smokeless Tobacco Never    Goals Met:  Proper associated with RPD/PD & O2 Sat Independence with exercise equipment Using PLB without cueing & demonstrates good technique Exercise tolerated well No report of concerns or symptoms today Strength training completed today  Goals Unmet:  Not Applicable  Comments:  Amber Holder graduated today from  rehab with 36 sessions completed.  Details of the patient's exercise prescription and what She needs to do in order to continue the prescription and progress were discussed with patient.  Patient was given a copy of prescription and goals.  Patient verbalized understanding. Amber Holder plans to continue  to exercise by going to the [facility].    Dr. Oneil Pinal is Medical Director for [facility] Cardiac Rehabilitation.  Dr. Fuad Aleskerov is Medical Director for [facility] Pulmonary Rehabilitation.

## 2024-06-02 NOTE — Progress Notes (Signed)
 Pulmonary Individual Treatment Plan  Patient Details  Name: Amber Holder MRN: [id number] Date of Birth: 1946/07/11 Referring Provider:   Flowsheet Row Pulmonary Rehab from 01/17/2024 in [facility] Card[facility] Rehab  Referring Provider Dr. Elfreda Bathe, MD    Initial Encounter Date:  Flowsheet Row Pulmonary Rehab from 01/17/2024 in [facility] Cardiac and Pulmonary Rehab  Date 01/17/24    Visit Diagnosis: Pulmonary artery hypertension (HCC)  Patient's Home Medications on Admission:  Current Outpatient Medications:    acetaminophen  (TYLENOL ) 500 MG chewable tablet, Chew 1,000 mg by mouth every 8 (eight) hours as needed for pain., Disp: , Rfl:    albuterol  (VENTOLIN  HFA) 108 (90 Base) MCG/ACT inhaler, Inhale 2 puffs into the lungs every 6 (six) hours as needed for wheezing or shortness of breath., Disp: 18 g, Rfl: 2   aspirin  EC 81 MG tablet, Take 1 tablet (81 mg total) by mouth daily. Swallow whole., Disp: 90 tablet, Rfl: 3   cholecalciferol  (VITAMIN D ) 400 UNITS TABS tablet, Take 2,000 Units by mouth daily., Disp: , Rfl:    CRANBERRY PO, Take 25,000 mg by mouth daily., Disp: , Rfl:    diclofenac  Sodium (VOLTAREN ) 1 % GEL, Apply small amount to area of pain daily as needed, Disp: 2 g, Rfl: 0   ergocalciferol  (DRISDOL ) 1.25 MG (50000 UT) capsule, Take one cap q week, Disp: 12 capsule, Rfl: 3   estradiol  (ESTRACE ) 0.1 MG/GM vaginal cream, Estrogen Cream Instruction Discard applicator Apply pea sized amount to tip of finger to urethra before bed. Wash hands well after application. Use Monday, Wednesday and Friday, Disp: 42.5 g, Rfl: 12   ezetimibe  (ZETIA ) 10 MG tablet, Take 1 tablet (10 mg total) by mouth daily., Disp: 90 tablet, Rfl: 3   FARXIGA  10 MG TABS tablet, TAKE 1 TABLET BY MOUTH DAILY BEFORE BREAKFAST., Disp: 90 tablet, Rfl: 3   fluconazole  (DIFLUCAN ) 150 MG tablet, Take one tab po q week for fungal infection and as needed, Disp: 12 tablet, Rfl: 1   Fluticasone -Umeclidin-Vilant (TRELEGY  ELLIPTA) 100-62.5-25 MCG/ACT AEPB, Use inhale  orally daily, Disp: 3 each, Rfl: 4   glucose blood (ONETOUCH VERIO) test strip, BLOOD SUGAR TESTING ONCE DAILY . DX E11.65, Disp: 100 strip, Rfl: 3   hydrocortisone  cream 0.5 %, Apply topically as needed., Disp: 30 g, Rfl: 0   ibandronate  (BONIVA ) 150 MG tablet, TAKE 1 TABLET (150 MG TOTAL) BY MOUTH EVERY 30 (THIRTY) DAYS., Disp: 3 tablet, Rfl: 3   ketoconazole (NIZORAL) 2 % cream, Apply 1 Application topically daily., Disp: , Rfl:    Lancets (ONETOUCH DELICA PLUS LANCET30G) MISC, Use  as directed twice a daily DX E11.65, Disp: 100 each, Rfl: 3   macitentan  (OPSUMIT ) 10 MG tablet, Take 1 tablet (10 mg total) by mouth daily., Disp: 90 tablet, Rfl: 5   Magnesium  250 MG TABS, Take by mouth. Takes 1 tablet 2-3 times per week, Disp: , Rfl:    montelukast  (SINGULAIR ) 10 MG tablet, Take 1 tablet (10 mg total) by mouth daily as needed., Disp: 90 tablet, Rfl: 1   Multiple Vitamins-Minerals (MULTIVITAMIN ADULTS PO), Take 1 tablet by mouth daily., Disp: , Rfl:    omeprazole  (PRILOSEC) 40 MG capsule, TAKE 1 CAPSULE (40 MG TOTAL) BY MOUTH DAILY., Disp: 90 capsule, Rfl: 3   OXYGEN , Inhale 3 L into the lungs. Pt uses [facility] for Oxygen , Disp: , Rfl:    potassium chloride  SA (KLOR-CON  M) 20 MEQ tablet, Take 2 tablets (40 mEq total) by mouth  daily., Disp: 180 tablet, Rfl: 3   spironolactone  (ALDACTONE ) 25 MG tablet, Take 1 tablet (25 mg total) by mouth daily., Disp: 90 tablet, Rfl: 3   torsemide  (DEMADEX ) 20 MG tablet, TAKE 2 TABLETS (40MG ) BY MOUTH DAILY, Disp: 180 tablet, Rfl: 3  Current Facility-Administered Medications:    nitrofurantoin  (macrocrystal-monohydrate) (MACROBID ) capsule 100 mg, 100 mg, Oral, Q12H, McDonough, Tinnie POUR, PA-C  Past Medical History: Past Medical History:  Diagnosis Date   Anxiety 01/02/2012   Arthritis    Asthma    Blockage of coronary artery of heart (HCC)    Chronic kidney disease    Kidney stones   Colon polyps     COPD (chronic obstructive pulmonary disease) (HCC)    Diabetes mellitus without complication (HCC)    GERD (gastroesophageal reflux disease)    HOH (hard of hearing)    Bilateral hearing aids   Kidney stone    Osteoporosis    Sleep apnea    Does not use C-PAP on a regular basis   Urinary tract infection    Venous stasis     Tobacco Use: Social History   Tobacco Use  Smoking Status Former   Current packs/day: 0.00   Average packs/day: 1 pack/day for 50.0 years (50.0 ttl pk-yrs)   Types: Cigarettes   Start date: 12/29/1971   Quit date: 12/28/2021   Years since quitting: 2.4   Passive exposure: Never  Smokeless Tobacco Never    Labs: Review Flowsheet  More data exists      Latest Ref Rng & Units 07/07/2022 09/05/2022 06/11/2023 11/01/2023 03/06/2024  Labs for ITP Cardiac and Pulmonary Rehab  Cholestrol 0 - 200 mg/dL 827  - 830  - -  LDL (calc) 0 - 99 mg/dL 893  - 96  - -  Direct LDL 0 - 99 mg/dL - - 899  - -  HDL-C >59 mg/dL 45  - 57  - -  Trlycerides <150 mg/dL 895  - 79  - -  Hemoglobin A1c 4.0 - 5.6 % - 6.3  6.6  6.4  6.2      Pulmonary Assessment Scores:  Pulmonary Assessment Scores     Row Name 01/17/24 1149 05/16/24 1140       ADL UCSD   ADL Phase Entry --    SOB Score total 64 58    Rest 1 1    Walk 3 2    Stairs 4 5    Bath 3 2    Dress 2 2    Shop 3 3      CAT Score   CAT Score 14 14      mMRC Score   mMRC Score 2 --       UCSD: Self-administered rating of dyspnea associated with activities of daily living (ADLs) 6-point scale (0 = not at all to 5 = maximal or unable to do because of breathlessness)  Scoring Scores range from 0 to 120.  Minimally important difference is 5 units  CAT: CAT can identify the health impairment of COPD patients and is better correlated with disease progression.  CAT has a scoring range of zero to 40. The CAT score is classified into four groups of low (less than 10), medium (10 - 20), high (21-30) and very high  (31-40) based on the impact level of disease on health status. A CAT score over 10 suggests significant symptoms.  A worsening CAT score could be explained by an exacerbation, poor medication adherence, poor inhaler  technique, or progression of COPD or comorbid conditions.  CAT MCID is 2 points  mMRC: mMRC (Modified Medical Research Council) Dyspnea Scale is used to assess the degree of baseline functional disability in patients of respiratory disease due to dyspnea. No minimal important difference is established. A decrease in score of 1 point or greater is considered a positive change.   Pulmonary Function Assessment:   Exercise Target Goals: Exercise Program Goal: Individual exercise prescription set using results from initial 6 min walk test and THRR while considering  patient's activity barriers and safety.   Exercise Prescription Goal: Initial exercise prescription builds to 30-45 minutes a day of aerobic activity, 2-3 days per week.  Home exercise guidelines will be given to patient during program as part of exercise prescription that the participant will acknowledge.  Education: Aerobic Exercise: - Group verbal and visual presentation on the components of exercise prescription. Introduces F.I.T.T principle from ACSM for exercise prescriptions.  Reviews F.I.T.T. principles of aerobic exercise including progression. Written material provided at class time. Flowsheet Row Pulmonary Rehab from 05/28/2024 in [facility] Cardiac and Pulmonary Rehab  Date 01/23/24  Educator NT  Instruction Review Code 1- Verbalizes Understanding    Education: Resistance Exercise: - Group verbal and visual presentation on the components of exercise prescription. Introduces F.I.T.T principle from ACSM for exercise prescriptions  Reviews F.I.T.T. principles of resistance exercise including progression. Written material provided at class time.    Education: Exercise & Equipment Safety: - Individual verbal  instruction and demonstration of equipment use and safety with use of the equipment. Flowsheet Row Pulmonary Rehab from 05/28/2024 in [facility] Cardiac and Pulmonary Rehab  Date 01/17/24  Educator NT  Instruction Review Code 1- Verbalizes Understanding    Education: Exercise Physiology & General Exercise Guidelines: - Group verbal and written instruction with models to review the exercise physiology of the cardiovascular system and associated critical values. Provides general exercise guidelines with specific guidelines to those with heart or lung disease.  Flowsheet Row Pulmonary Rehab from 05/28/2024 in [facility] Cardiac and Pulmonary Rehab  Date 05/28/24  Educator nt  Instruction Review Code 1- TEFL teacher Understanding    Education: Flexibility, Balance, Mind/Body Relaxation: - Group verbal and visual presentation with interactive activity on the components of exercise prescription. Introduces F.I.T.T principle from ACSM for exercise prescriptions. Reviews F.I.T.T. principles of flexibility and balance exercise training including progression. Also discusses the mind body connection.  Reviews various relaxation techniques to help reduce and manage stress (i.e. Deep breathing, progressive muscle relaxation, and visualization). Balance handout provided to take home. Written material provided at class time.   Activity Barriers & Risk Stratification:  Activity Barriers & Cardiac Risk Stratification - 01/17/24 1150       Activity Barriers & Cardiac Risk Stratification   Activity Barriers Right Hip Replacement;Shortness of Breath;Muscular Weakness;Balance Concerns;Arthritis;Back Problems          6 Minute Walk:  6 Minute Walk     Row Name 01/17/24 1151 05/14/24 1138       6 Minute Walk   Phase Initial Discharge    Distance 1050 feet 1200 feet    Distance % Change -- 14.29 %    Distance Feet Change -- 150 ft    Walk Time 6 minutes 6 minutes    # of Rest Breaks 0 0    MPH 1.99 2.27    METS  1.58 1.85    RPE 13 14    Perceived Dyspnea  2 3    VO2 Peak  5.51 6.48    Symptoms Yes (comment) Yes (comment)    Comments R leg weakness 2/10 dull R hip pain    Resting HR 62 bpm 72 bpm    Resting BP 132/64 110/60    Resting Oxygen  Saturation  93 % 88 %    Exercise Oxygen  Saturation  during 6 min walk 87 % 85 %    Max Ex. HR 84 bpm 92 bpm    Max Ex. BP 132/64 120/54    2 Minute Post BP 114/58 106/60      Interval HR   1 Minute HR 76 82    2 Minute HR 80 88    3 Minute HR 81 90    4 Minute HR 81 91    5 Minute HR 84 91    6 Minute HR 83 92    2 Minute Post HR 63 72    Interval Heart Rate? Yes Yes      Interval Oxygen    Interval Oxygen ? Yes Yes    Baseline Oxygen  Saturation % 93 % 88 %    1 Minute Oxygen  Saturation % 91 % 89 %    1 Minute Liters of Oxygen  2 L 3 L    2 Minute Oxygen  Saturation % 88 % 88 %    2 Minute Liters of Oxygen  2 L 3 L    3 Minute Oxygen  Saturation % 87 % 85 %    3 Minute Liters of Oxygen  2 L 3 L    4 Minute Oxygen  Saturation % 87 % 86 %    4 Minute Liters of Oxygen  2 L 3 L    5 Minute Oxygen  Saturation % 87 % 85 %    5 Minute Liters of Oxygen  2 L 3 L    6 Minute Oxygen  Saturation % 87 % 87 %    6 Minute Liters of Oxygen  2 L 3 L    2 Minute Post Oxygen  Saturation % 94 % 95 %    2 Minute Post Liters of Oxygen  2 L 3 L      Oxygen  Initial Assessment:  Oxygen  Initial Assessment - 01/15/24 1302       Home Oxygen    Home Oxygen  Device Home Concentrator;E-Tanks;Portable Concentrator    Sleep Oxygen  Prescription Continuous    Liters per minute 2    Home Exercise Oxygen  Prescription Continuous    Liters per minute 3   2-3   Home Resting Oxygen  Prescription Continuous    Liters per minute 2.5    Compliance with Home Oxygen  Use Yes          Oxygen  Re-Evaluation:  Oxygen  Re-Evaluation     Row Name 01/23/24 1134 03/17/24 1134 04/07/24 1148         Program Oxygen  Prescription   Program Oxygen  Prescription Continuous Continuous;E-Tanks  Continuous;E-Tanks     Liters per minute 2 2 2        Home Oxygen    Home Oxygen  Device Home Concentrator;E-Tanks;Portable Concentrator Home Concentrator;E-Tanks;Portable Concentrator Home Concentrator;E-Tanks;Portable Concentrator     Sleep Oxygen  Prescription Continuous Continuous Continuous;CPAP  does not wear CPAP     Liters per minute 2 2 2      Home Exercise Oxygen  Prescription Continuous Continuous Continuous     Liters per minute 3 3 3      Home Resting Oxygen  Prescription Continuous Continuous Continuous     Liters per minute 2.5 2 2      Compliance with Home Oxygen  Use Yes Yes Yes  Goals/Expected Outcomes   Short Term Goals To learn and demonstrate proper pursed lip breathing techniques or other breathing techniques.  To learn and demonstrate proper pursed lip breathing techniques or other breathing techniques.  To learn and understand importance of maintaining oxygen  saturations>88%;Other     Long  Term Goals Exhibits proper breathing techniques, such as pursed lip breathing or other method taught during program session Exhibits proper breathing techniques, such as pursed lip breathing or other method taught during program session Other;Maintenance of O2 saturations>88%     Comments Reviewed PLB technique with pt.  Talked about how it works and it's importance in maintaining their exercise saturations. Informed patient how to perform the Pursed Lipped breathing technique. Told patient to Inhale through the nose and out the mouth with pursed lips to keep their airways open, help oxygenate them better, practice when at rest or doing strenuous activity. Patient Verbalizes understanding of technique and will work on and be reiterated during [facility]. Amber Holder states that she feels more short of breath lately. She states she does not use her CPAP, wakes up in the morning with her oxygen  levels at 84 percent. Informed and explained the difference between shortness of breath and oxygenation. Also  informed patient to try to wear her CPAP if she can tolerate it.     Goals/Expected Outcomes Short: Become more profiecient at using PLB. Long: Become independent at using PLB. Short: use PLB with exertion. Long: use PLB on exertion proficiently and independently. Short: try to wear CPAP again. Long: maintain oxygen  saturation and sleep apnea independently.        Oxygen  Discharge (Final Oxygen  Re-Evaluation):  Oxygen  Re-Evaluation - 04/07/24 1148       Program Oxygen  Prescription   Program Oxygen  Prescription Continuous;E-Tanks    Liters per minute 2      Home Oxygen    Home Oxygen  Device Home Concentrator;E-Tanks;Portable Concentrator    Sleep Oxygen  Prescription Continuous;CPAP   does not wear CPAP   Liters per minute 2    Home Exercise Oxygen  Prescription Continuous    Liters per minute 3    Home Resting Oxygen  Prescription Continuous    Liters per minute 2    Compliance with Home Oxygen  Use Yes      Goals/Expected Outcomes   Short Term Goals To learn and understand importance of maintaining oxygen  saturations>88%;Other    Long  Term Goals Other;Maintenance of O2 saturations>88%    Comments Amber Holder states that she feels more short of breath lately. She states she does not use her CPAP, wakes up in the morning with her oxygen  levels at 84 percent. Informed and explained the difference between shortness of breath and oxygenation. Also informed patient to try to wear her CPAP if she can tolerate it.    Goals/Expected Outcomes Short: try to wear CPAP again. Long: maintain oxygen  saturation and sleep apnea independently.          Initial Exercise Prescription:  Initial Exercise Prescription - 01/17/24 1100       Date of Initial Exercise RX and Referring Provider   Date 01/17/24    Referring Provider Dr. Elfreda Bathe, MD      Oxygen    Oxygen  Continuous    Liters 2-3    Maintain Oxygen  Saturation 88% or higher      Treadmill   MPH 1.8    Grade 0    Minutes 15    METs 2.38       Recumbant Bike   Level 1  RPM 50    Watts 15    Minutes 15    METs 1.58      NuStep   Level 2    SPM 80    Minutes 15    METs 1.58      Prescription Details   Frequency (times per week) 2    Duration Progress to 30 minutes of continuous aerobic without signs/symptoms of physical distress      Intensity   THRR 40-80% of Max Heartrate 94-126    Ratings of Perceived Exertion 11-13    Perceived Dyspnea 0-4      Progression   Progression Continue to progress workloads to maintain intensity without signs/symptoms of physical distress.      Resistance Training   Training Prescription Yes    Weight 3 lb    Reps 10-15          Perform Capillary Blood Glucose checks as needed.  Exercise Prescription Changes:   Exercise Prescription Changes     Row Name 01/17/24 1100 01/30/24 1500 02/13/24 1500 02/28/24 1100 03/11/24 1100     Response to Exercise   Blood Pressure (Admit) 120/60 108/58 108/62 104/60 104/60   Blood Pressure (Exercise) 132/64 122/64 132/58 132/52 --   Blood Pressure (Exit) 114/58 116/58 104/56 108/64 104/72   Heart Rate (Admit) 62 bpm 73 bpm 68 bpm 74 bpm 65 bpm   Heart Rate (Exercise) 84 bpm 80 bpm 85 bpm 85 bpm 93 bpm   Heart Rate (Exit) 63 bpm 76 bpm 69 bpm 66 bpm 75 bpm   Oxygen  Saturation (Admit) 93 % 92 % 93 % 89 % 93 %   Oxygen  Saturation (Exercise) 87 % 88 % 88 % 88 % 89 %   Oxygen  Saturation (Exit) 94 % 89 % 91 % 94 % 90 %   Rating of Perceived Exertion (Exercise) 13 13 15 14 15    Perceived Dyspnea (Exercise) 2 -- 4 2 2    Symptoms R leg weakness none none none none   Comments Results first 2 weeks of exercise -- -- --   Duration -- Progress to 30 minutes of  aerobic without signs/symptoms of physical distress Progress to 30 minutes of  aerobic without signs/symptoms of physical distress Continue with 30 min of aerobic exercise without signs/symptoms of physical distress. Continue with 30 min of aerobic exercise without signs/symptoms of  physical distress.   Intensity -- THRR unchanged THRR unchanged THRR unchanged THRR unchanged     Progression   Progression -- Continue to progress workloads to maintain intensity without signs/symptoms of physical distress. Continue to progress workloads to maintain intensity without signs/symptoms of physical distress. Continue to progress workloads to maintain intensity without signs/symptoms of physical distress. Continue to progress workloads to maintain intensity without signs/symptoms of physical distress.   Average METs -- 2.44 2.4 2.69 2.52     Resistance Training   Training Prescription -- Yes Yes Yes Yes   Weight -- 3 lb 3 lb 3 lb 3 lb   Reps -- 10-15 10-15 10-15 10-15     Interval Training   Interval Training -- No No No No     Oxygen    Oxygen  -- Continuous Continuous Continuous Continuous   Liters -- 2-3 2-3 2-3 2-3L     Treadmill   MPH -- 1.8 1.8 1.8 2   Grade -- 0 0 1.5 0   Minutes -- 15 15 15 15    METs -- 2.38 2.38 2.75 2.53  Recumbant Bike   Level -- -- 1.1 2.5 2.6   Watts -- -- 15 15 15    Minutes -- -- 15 15 15    METs -- -- 2.55 2.54 2.55     NuStep   Level -- 2 3 4 6    Minutes -- 15 15 15 15    METs -- 2.5 2.6 3.1 2     Oxygen    Maintain Oxygen  Saturation -- 88% or higher 88% or higher 88% or higher --    Row Name 03/24/24 1100 03/27/24 1400 04/09/24 1100 04/24/24 1100 05/08/24 1600     Response to Exercise   Blood Pressure (Admit) -- 122/60 108/60 104/60 104/60   Blood Pressure (Exit) -- 112/58 118/62 96/54 102/58   Heart Rate (Admit) -- 76 bpm 69 bpm 79 bpm 69 bpm   Heart Rate (Exercise) -- 92 bpm 93 bpm 81 bpm 82 bpm   Heart Rate (Exit) -- 83 bpm 72 bpm 80 bpm 71 bpm   Oxygen  Saturation (Admit) -- 90 % 92 % 90 % 92 %   Oxygen  Saturation (Exercise) -- 88 % 87 % 87 % 88 %   Oxygen  Saturation (Exit) -- 90 % 90 % 92 % 93 %   Rating of Perceived Exertion (Exercise) -- 15 15 15 15    Perceived Dyspnea (Exercise) -- 3 3 4 3    Symptoms -- none none  none none   Duration -- Continue with 30 min of aerobic exercise without signs/symptoms of physical distress. Continue with 30 min of aerobic exercise without signs/symptoms of physical distress. Continue with 30 min of aerobic exercise without signs/symptoms of physical distress. Continue with 30 min of aerobic exercise without signs/symptoms of physical distress.   Intensity -- THRR unchanged THRR unchanged THRR unchanged THRR unchanged     Progression   Progression -- Continue to progress workloads to maintain intensity without signs/symptoms of physical distress. Continue to progress workloads to maintain intensity without signs/symptoms of physical distress. Continue to progress workloads to maintain intensity without signs/symptoms of physical distress. Continue to progress workloads to maintain intensity without signs/symptoms of physical distress.   Average METs -- 2.4 2.83 2.94 3.08     Resistance Training   Training Prescription -- Yes Yes Yes Yes   Weight -- 3 lb 3 lb 3 lb 3 lb   Reps -- 10-15 10-15 10-15 10-15     Interval Training   Interval Training -- No No No No     Oxygen    Oxygen  -- Continuous Continuous Continuous Continuous   Liters -- 2-3L 3L 3L 3L     Treadmill   MPH -- 2 2 2 2    Grade -- 0.5 5 5 5    Minutes -- 15 15 15 15    METs -- 2.67 3.91 3.91 3.91     Recumbant Bike   Level -- 3 2.5 3 --   Watts -- 15 15 15  --   Minutes -- 15 15 15  --   METs -- 2.56 2.56 2.55 --     NuStep   Level -- 4 4 4 4    Minutes -- 15 15 15 15    METs -- 3.2 3.1 3.1 3.1     T5 Nustep   Level -- 3 -- -- --   Minutes -- 15 -- -- --   METs -- 1.8 -- -- --     Home Exercise Plan   Plans to continue exercise at [facility] plans to continue exercising at  the [facility] for [facility] exercise. She currently goes once a week and will increase to three times a week after graduation. [facility]  Aarica plans to continue exercising at [facility] [employer] for home exercise. She  currently goes once a week and will increase to three times a week after graduation. [facility]  Vernisha plans to continue exercising at [facility] [employer] for home exercise. She currently goes once a week and will increase to three times a week after graduation. [facility]  Keyera plans to continue exercising at [facility] for home exercise. She currently goes once a week and will increase to three times a week after graduation. [facility]  Damonique plans to continue exercising at [facility] for home exercise. She currently goes once a week and will increase to three times a week after graduation.   Frequency Add 3 additional days to program exercise sessions. Add 3 additional days to program exercise sessions. Add 3 additional days to program exercise sessions. Add 3 additional days to program exercise sessions. Add 3 additional days to program exercise sessions.   Initial Home Exercises Provided 03/24/24 03/24/24 03/24/24 03/24/24 03/24/24     Oxygen    Maintain Oxygen  Saturation -- 88% or higher 88% or higher 88% or higher 88% or higher    Row Name 05/20/24 1500             Response to Exercise   Blood Pressure (Admit) 110/60       Blood Pressure (Exit) 120/82       Heart Rate (Admit) 68 bpm       Heart Rate (Exercise) 85 bpm       Heart Rate (Exit) 73 bpm       Oxygen  Saturation (Admit) 88 %       Oxygen  Saturation (Exercise) 88 %       Oxygen  Saturation (Exit) 90 %       Rating of Perceived Exertion (Exercise) 15       Perceived Dyspnea (Exercise) 3       Symptoms none       Duration Continue with 30 min of aerobic exercise without signs/symptoms of physical distress.       Intensity THRR unchanged         Progression   Progression Continue to progress workloads to maintain intensity without signs/symptoms of physical distress.       Average METs 2.66         Resistance Training   Training Prescription Yes       Weight 3 lb       Reps 10-15          Interval Training   Interval Training No         Oxygen    Oxygen  Continuous       Liters 3L         Treadmill   MPH 2       Grade 4       Minutes 15       METs 3.63         Recumbant Bike   Level 3.5       Watts 15       Minutes 15       METs 2.56         NuStep   Level 4       Minutes 15       METs 2.5         Home  Exercise Plan   Plans to continue exercise at [facility] plans to continue exercising at [facility] for home exercise. She currently goes once a week and will increase to three times a week after graduation.       Frequency Add 3 additional days to program exercise sessions.       Initial Home Exercises Provided 03/24/24         Oxygen    Maintain Oxygen  Saturation 88% or higher          Exercise Comments:   Exercise Comments     Row Name 01/23/24 1133 06/02/24 1110         Exercise Comments First full day of exercise!  Patient was oriented to gym and equipment including functions, settings, policies, and procedures.  Patient's individual exercise prescription and treatment plan were reviewed.  All starting workloads were established based on the results of the 6 minute walk test done at initial orientation visit.  The plan for exercise progression was also introduced and progression will be customized based on patient's performance and goals. Amber Holder graduated today from  rehab with 36 sessions completed.  Details of the patient's exercise prescription and what She needs to do in order to continue the prescription and progress were discussed with patient.  Patient was given a copy of prescription and goals.  Patient verbalized understanding. Amber Holder plans to continue to exercise by going to the [facility].         Exercise Goals and Review:   Exercise Goals     Row Name 01/17/24 1150             Exercise Goals   Increase Physical Activity Yes       Intervention Provide advice, education, support and counseling about physical  activity/exercise needs.;Develop an individualized exercise prescription for aerobic and resistive training based on initial evaluation findings, risk stratification, comorbidities and participant's personal goals.       Expected Outcomes Short Term: Attend rehab on a regular basis to increase amount of physical activity.;Long Term: Add in home exercise to make exercise part of routine and to increase amount of physical activity.;Long Term: Exercising regularly at least 3-5 days a week.       Increase Strength and Stamina Yes       Intervention Provide advice, education, support and counseling about physical activity/exercise needs.;Develop an individualized exercise prescription for aerobic and resistive training based on initial evaluation findings, risk stratification, comorbidities and participant's personal goals.       Expected Outcomes Short Term: Increase workloads from initial exercise prescription for resistance, speed, and METs.;Short Term: Perform resistance training exercises routinely during rehab and add in resistance training at home;Long Term: Improve cardiorespiratory fitness, muscular endurance and strength as measured by increased METs and functional capacity ( )       Able to understand and use rate of perceived exertion (RPE) scale Yes       Intervention Provide education and explanation on how to use RPE scale       Expected Outcomes Short Term: Able to use RPE daily in rehab to express subjective intensity level;Long Term:  Able to use RPE to guide intensity level when exercising independently       Able to understand and use Dyspnea scale Yes       Intervention Provide education and explanation on how to use Dyspnea scale       Expected Outcomes Short Term: Able to use Dyspnea scale daily in  rehab to express subjective sense of shortness of breath during exertion;Long Term: Able to use Dyspnea scale to guide intensity level when exercising independently       Knowledge and  understanding of Target Heart Rate Range (THRR) Yes       Intervention Provide education and explanation of THRR including how the numbers were predicted and where they are located for reference       Expected Outcomes Short Term: Able to state/look up THRR;Long Term: Able to use THRR to govern intensity when exercising independently;Short Term: Able to use daily as guideline for intensity in rehab       Able to check pulse independently Yes       Intervention Provide education and demonstration on how to check pulse in carotid and radial arteries.;Review the importance of being able to check your own pulse for safety during independent exercise       Expected Outcomes Short Term: Able to explain why pulse checking is important during independent exercise;Long Term: Able to check pulse independently and accurately       Understanding of Exercise Prescription Yes       Intervention Provide education, explanation, and written materials on patient's individual exercise prescription       Expected Outcomes Short Term: Able to explain program exercise prescription;Long Term: Able to explain home exercise prescription to exercise independently          Exercise Goals Re-Evaluation :  Exercise Goals Re-Evaluation     Row Name 01/23/24 1135 01/30/24 1556 02/13/24 1518 02/28/24 1127 03/11/24 1127     Exercise Goal Re-Evaluation   Exercise Goals Review Able to understand and use rate of perceived exertion (RPE) scale;Able to understand and use Dyspnea scale;Knowledge and understanding of Target Heart Rate Range (THRR);Understanding of Exercise Prescription Increase Physical Activity;Increase Strength and Stamina;Understanding of Exercise Prescription Increase Physical Activity;Increase Strength and Stamina;Understanding of Exercise Prescription Increase Physical Activity;Increase Strength and Stamina;Understanding of Exercise Prescription Increase Physical Activity;Increase Strength and Stamina;Understanding  of Exercise Prescription   Comments Reviewed RPE and dyspnea scale, THR and program prescription with pt today.  Pt voiced understanding and was given a copy of goals to take home. Amber Holder is off to a good start in the program. She was able to complete her first session during this review period. During her session she used the treadmill at a speed of 1.44mph and no incline, and the T4 nustep at level 2. We will continue to monitor her progress in the program. Amber Holder is doing well in rehab. She has recently been able to increase from level 2 to 3 on the T4 nustep. She was also able to increase her speed on the treadmill to 1.47mph. We will contiue to monitor her progress in the program. Amber Holder continues to do well in rehab. She increased her treadmill workload by increasing her incline to 1.5% with a speed of 1.8 mph. She also improved to level 4 on the T4 nustep and level 2.5 on the recumbent bike. We will continue to monitor her progress in the program. Amber Holder is doing well in rehab. She was recently able to increase from level 4 to 6 on the T4 nustep. She was also able to use the treadmill at a speed of and no incline. We will continue to monitor her progress in the program.   Expected Outcomes Short: Use RPE daily to regulate intensity. Long: Follow program prescription in THR. Short: Continue to follow exercise prescription. Long: Continue exercise to improve  strength and stamina. Short: Continue to follow exercise prescription. Long: Continue exercise to improve strength and stamina. Short: Continue to progressively increase workloads. Long: Continue exercise to improve strength and stamina. Short: Continue to progressively increase workloads. Long: Continue exercise to improve strength and stamina.    Row Name 03/24/24 1139 03/27/24 1406 04/07/24 1130 04/09/24 1140 04/24/24 1104     Exercise Goal Re-Evaluation   Exercise Goals Review Increase Physical Activity;Able to understand and use Dyspnea  scale;Understanding of Exercise Prescription;Knowledge and understanding of Target Heart Rate Range (THRR);Increase Strength and Stamina;Able to check pulse independently;Able to understand and use rate of perceived exertion (RPE) scale Increase Physical Activity;Increase Strength and Stamina;Understanding of Exercise Prescription Increase Physical Activity;Increase Strength and Stamina;Understanding of Exercise Prescription Increase Physical Activity;Increase Strength and Stamina;Understanding of Exercise Prescription Increase Physical Activity;Increase Strength and Stamina;Understanding of Exercise Prescription   Comments Reviewed home exercise with pt today from 11:25 to 11:40.  Pt plans to continue going [facility] for [facility].  Reviewed THR, pulse, RPE, sign and symptoms, pulse oximetery and when to call 911 or MD.  Also discussed weather considerations and indoor options.  Pt voiced understanding. Amber Holder is doing well in rehab. She was recently able to increase her workload on the treadmill to a speed of and an incline of 0.5%. She was also able to increase from level 2.6 to 3 on the recumbent bike. We will continue to monitor her progress in the program. Follow up on home exercise was done with patient. She reports that she is trying to do hand weight and stretching exercises on days that she is not in class. [facility] also [facility] and exercises on Fridays there when she is not in pulmonary rehab. She is gett in 4-5 days a week of exericse total. Amber Holder continues to do well in rehab. She increased her workload on the treadmill to a speed of 2 mph and incline of 5%. She maintained level 4 on the T4 nustep. She worked at level 2.5 on the recumbent bike. We will continue to monitor her progress in the program. Amber Holder continues to do well in rehab. She increased to level 3 on the recumbent bike. She maintained her workload on the treadmill with a speed of 2 mph and 5% incline.  She also maintained level 4 on the T4 nustep. We will continue to monitor her progress in the program.   Expected Outcomes Short: Continue to exercise at home. Long: Continue exercise to improve strength and stamina. Short: Continue to exercise treadmill workload. Long: Continue exercise to improve strength and stamina. Short: continue to exercise 1-2 days a week at home or at the [facility] while continuing pulmonary rehab. Long: maintain independent exercise routine upon graduation from rehab. Short: Try level 5 on the T4 nustep. Long: Continue exercise to improve strength and stamina. Short: Try level 5 on the T4 nustep and progressively increase workload on the treadmill. Long: Continue exercise to improve strength and stamina.    Row Name 05/08/24 1652 05/12/24 1127 05/20/24 1517         Exercise Goal Re-Evaluation   Exercise Goals Review Increase Physical Activity;Increase Strength and Stamina;Understanding of Exercise Prescription Increase Physical Activity;Increase Strength and Stamina;Understanding of Exercise Prescription Increase Physical Activity;Understanding of Exercise Prescription;Increase Strength and Stamina     Comments Amber Holder continues to do well in rehab. She is due for her post soon and hopes to improve. She has maintained her workload on the treadmill  with a speed of 2 mph and incline of 5%. She maintained level 4 on the T4 nustep. We will continue to monitor her progress in the program. Amber Holder will graduate from rehab in the next few weeks. She plans to continue her exercise at the [facility] once she graduates from pulmonary rehab. She did not want to do her 6 MWT today due to just getting over a cold but she plans to do her 6 MWT on her next appointment. Amber Holder continues to do well in rehab and will graduate very soon. She completed her post and improved by 14.29% with 1269ft. She increased to level 3.5 on the recumbent bike. She maintained level 4 on the T4  nustep. She dropped her incline to 4% on the treadmill with keeping her speed of 2 mph. We will continue to monitor her progress in the program until graduation.     Expected Outcomes Short: Improve on post . Long: Continue to increase overall METs and stamina. Short: Improve on post . Graduate.  Long: maintain independent exercise routine upon graduation from pulmonary rehab. Short: Graduate. Long: Continue to exercise independently.        Discharge Exercise Prescription (Final Exercise Prescription Changes):  Exercise Prescription Changes - 05/20/24 1500       Response to Exercise   Blood Pressure (Admit) 110/60    Blood Pressure (Exit) 120/82    Heart Rate (Admit) 68 bpm    Heart Rate (Exercise) 85 bpm    Heart Rate (Exit) 73 bpm    Oxygen  Saturation (Admit) 88 %    Oxygen  Saturation (Exercise) 88 %    Oxygen  Saturation (Exit) 90 %    Rating of Perceived Exertion (Exercise) 15    Perceived Dyspnea (Exercise) 3    Symptoms none    Duration Continue with 30 min of aerobic exercise without signs/symptoms of physical distress.    Intensity THRR unchanged      Progression   Progression Continue to progress workloads to maintain intensity without signs/symptoms of physical distress.    Average METs 2.66      Resistance Training   Training Prescription Yes    Weight 3 lb    Reps 10-15      Interval Training   Interval Training No      Oxygen    Oxygen  Continuous    Liters 3L      Treadmill   MPH 2    Grade 4    Minutes 15    METs 3.63      Recumbant Bike   Level 3.5    Watts 15    Minutes 15    METs 2.56      NuStep   Level 4    Minutes 15    METs 2.5      Home Exercise Plan   Plans to continue exercise at [facility] plans to continue exercising at the [facility] for home exercise. She currently goes once a week and will increase to three times a week after graduation.   Frequency Add 3 additional days to program exercise sessions.     Initial Home Exercises Provided 03/24/24      Oxygen    Maintain Oxygen  Saturation 88% or higher          Nutrition:  Target Goals: Understanding of nutrition guidelines, daily intake of sodium 1500mg , cholesterol 200mg , calories 30% from fat and 7% or less from saturated fats, daily to have 5 or more  servings of fruits and vegetables.  Education: Nutrition 1 -Group instruction provided by verbal, written material, interactive activities, discussions, models, and posters to present general guidelines for heart healthy nutrition including macronutrients, label reading, and promoting whole foods over processed counterparts. Education serves as Pensions consultant of discussion of heart healthy eating for all. Written material provided at class time.     Education: Nutrition 2 -Group instruction provided by verbal, written material, interactive activities, discussions, models, and posters to present general guidelines for heart healthy nutrition including sodium, cholesterol, and saturated fat. Providing guidance of habit forming to improve blood pressure, cholesterol, and body weight. Written material provided at class time.     Biometrics:  Pre Biometrics - 01/17/24 1151       Pre Biometrics   Height 5' 4.25 (1.632 m)    Weight 188 lb 14.4 oz (85.7 kg)    Waist Circumference 40 inches    Hip Circumference 45 inches    Waist to Hip Ratio 0.89 %    BMI (Calculated) 32.17    Single Leg Stand 1.6 seconds          Post Biometrics - 05/14/24 1141        Post  Biometrics   Height 5' 4.25 (1.632 m)    Weight 187 lb (84.8 kg)    Waist Circumference 39.6 inches    Hip Circumference 43 inches    Waist to Hip Ratio 0.92 %    BMI (Calculated) 31.85    Single Leg Stand 1.6 seconds          Nutrition Therapy Plan and Nutrition Goals:  Nutrition Therapy & Goals - 01/17/24 1124       Nutrition Therapy   Diet carb controlled, Cardiac Low Na    Protein (specify units) 90    Fiber  25 grams    Whole Grain Foods 3 servings    Saturated Fats 15 max. grams    Fruits and Vegetables 5 servings/day    Sodium 2 grams      Personal Nutrition Goals   Nutrition Goal Eat 15-30gProtein and 30-60gCarbs at each meal.    Personal Goal #2 Read labels and reduce sodium intake to below 2300mg . Ideally 1500mg  per day.    Comments Patient drinking mostly water, getting ~40-64oz daily. She says she usually eats 3 meals per day. Reviewed her food recall, educated on importance of pairing quality carbs with protein and healthy fat. She already monitors her sodium intake, says she doesn't cook or use it at the table. Encouraged her to read labels and try and keep sodium below 1500mg . Provided Mediterranean diet handout. Educated on types of fats, sources, and how to read labels. Brainstormed several meals and snack ideas and went over sample plate handout.      Intervention Plan   Intervention Prescribe, educate and counsel regarding individualized specific dietary modifications aiming towards targeted core components such as weight, hypertension, lipid management, diabetes, heart failure and other comorbidities.;Nutrition handout(s) given to patient.    Expected Outcomes Short Term Goal: Understand basic principles of dietary content, such as calories, fat, sodium, cholesterol and nutrients.;Short Term Goal: A plan has been developed with personal nutrition goals set during dietitian appointment.;Long Term Goal: Adherence to prescribed nutrition plan.          Nutrition Assessments:  MEDIFICTS Score Key: >=70 Need to make dietary changes  40-70 Heart Healthy Diet <= 40 Therapeutic Level Cholesterol Diet  Flowsheet Row Documentation from 05/16/2024 in [facility] Card[facility] Pulmonary  Rehab  Picture Your Plate Total Score on Discharge 65   Picture Your Plate Scores: <59 Unhealthy dietary pattern with much room for improvement. 41-50 Dietary pattern unlikely to meet recommendations for good  health and room for improvement. 51-60 More healthful dietary pattern, with some room for improvement.  >60 Healthy dietary pattern, although there may be some specific behaviors that could be improved.   Nutrition Goals Re-Evaluation:  Nutrition Goals Re-Evaluation     Row Name 03/17/24 1133 04/07/24 1154           Goals   Comment Patient was informed on why it is important to maintain a balanced diet when dealing with Respiratory issues. Explained that it takes a lot of energy to breath and when they are short of breath often they will need to have a good diet to help keep up with the calories they are expending for breathing. Amber Holder states that she has cut out sweets and has been improving her diet. She is eating more protiens and vegetables.      Expected Outcome Short: Choose and plan snacks accordingly to patients caloric intake to improve breathing. Long: Maintain a diet independently that meets their caloric intake to aid in daily shortness of breath. Short: eat smaller portions. Long: adhere to a diet that pertains to her.         Nutrition Goals Discharge (Final Nutrition Goals Re-Evaluation):  Nutrition Goals Re-Evaluation - 04/07/24 1154       Goals   Comment Amber Holder states that she has cut out sweets and has been improving her diet. She is eating more protiens and vegetables.    Expected Outcome Short: eat smaller portions. Long: adhere to a diet that pertains to her.          Psychosocial: Target Goals: Acknowledge presence or absence of significant depression and/or stress, maximize coping skills, provide positive support system. Participant is able to verbalize types and ability to use techniques and skills needed for reducing stress and depression.   Education: Stress, Anxiety, and Depression - Group verbal and visual presentation to define topics covered.  Reviews how body is impacted by stress, anxiety, and depression.  Also discusses healthy ways to reduce stress  and to treat/manage anxiety and depression.  Written material provided at class time. Flowsheet Row Pulmonary Rehab from 08/23/2022 in [facility] Card[facility]  Date 06/21/22  Educator [facility]  Instruction Review Code 1- [employer] Understanding    Education: Sleep Hygiene -Provides group verbal and written instruction about how sleep can affect your health.  Define sleep hygiene, discuss sleep cycles and impact of sleep habits. Review good sleep hygiene tips.    Initial Review & Psychosocial Screening:  Initial Psych Review & Screening - 01/15/24 1309       Initial Review   Current issues with Current Depression      Family Dynamics   Good Support System? Yes   friends     Barriers   Psychosocial barriers to participate in program The patient should benefit from training in stress management and relaxation.;There are no identifiable barriers or psychosocial needs.      Screening Interventions   Interventions Encouraged to exercise;To provide support and resources with identified psychosocial needs    Expected Outcomes Short Term goal: Utilizing psychosocial counselor, staff and physician to assist with identification of specific Stressors or current issues interfering with healing process. Setting desired goal for each stressor or current issue identified.;Long Term Goal: Stressors or current issues are  controlled or eliminated.;Short Term goal: Identification and review with participant of any Quality of Life or Depression concerns found by scoring the questionnaire.;Long Term goal: The participant improves quality of Life and PHQ9 Scores as seen by post scores and/or verbalization of changes          Quality of Life Scores:  Scores of 19 and below usually indicate a poorer quality of life in these areas.  A difference of  2-3 points is a clinically meaningful difference.  A difference of 2-3 points in the total score of the Quality of Life Index has been associated with  significant improvement in overall quality of life, self-image, physical symptoms, and general health in studies assessing change in quality of life.  PHQ-9: Review Flowsheet  More data exists      05/16/2024 03/06/2024 01/17/2024 11/01/2023 06/07/2023  Depression screen PHQ 2/9  Decreased Interest 1 0 0 0 0  Down, Depressed, Hopeless 1 0 1 0 0  PHQ - 2 Score 2 0 1 0 0  Altered sleeping 1 - 0 - -  Tired, decreased energy 2 - 2 - -  Change in appetite 0 - 0 - -  Feeling bad or failure about yourself  0 - 0 - -  Trouble concentrating 0 - 0 - -  Moving slowly or fidgety/restless 0 - 0 - -  Suicidal thoughts 0 - 0 - -  PHQ-9 Score 5 - 3 - -  Difficult doing work/chores Not difficult at all - Not difficult at all - -   Interpretation of Total Score  Total Score Depression Severity:  1-4 = Minimal depression, 5-9 = Mild depression, 10-14 = Moderate depression, 15-19 = Moderately severe depression, 20-27 = Severe depression   Psychosocial Evaluation and Intervention:  Psychosocial Evaluation - 01/15/24 1324       Psychosocial Evaluation & Interventions   Comments Savana is returning to pulmonary rehab with pulmonary hypertension. She states she has been working on regulating her medications and prioritizing fluid balance. She wants to attend to work on her stamina and her breathing. She has a history of depression and mentions that its a daily choice on how she is going to feel. She does have friends that help her as well as a monthly call from a support system through her insurance.    Expected Outcomes Short: attend pulmonary rehab for education and exercise Long: develop and maintain positive self care habits.    Continue Psychosocial Services  Follow up required by staff          Psychosocial Re-Evaluation:  Psychosocial Re-Evaluation     Row Name 03/17/24 1137 04/07/24 1158           Psychosocial Re-Evaluation   Current issues with None Identified Current Stress Concerns       Comments Patient reports no issues with their current mental states, sleep, stress, depression or anxiety. Will follow up with patient in a few weeks for any changes. Leia states that her stressors stem from her breathing condition. She feels like she has been slowly going downhill. Her requirement for oxygen  is increasing.      Expected Outcomes Short: Continue to exercise regularly to support mental health and notify staff of any changes. Long: maintain mental health and well being through teaching of rehab or prescribed medications independently. Short: continue lungworks to aid with stress. Long: maintain a stress free environment.      Interventions Encouraged to attend Pulmonary Rehabilitation for the exercise Encouraged to  attend Pulmonary Rehabilitation for the exercise      Continue Psychosocial Services  Follow up required by staff Follow up required by staff         Psychosocial Discharge (Final Psychosocial Re-Evaluation):  Psychosocial Re-Evaluation - 04/07/24 1158       Psychosocial Re-Evaluation   Current issues with Current Stress Concerns    Comments Amber Holder states that her stressors stem from her breathing condition. She feels like she has been slowly going downhill. Her requirement for oxygen  is increasing.    Expected Outcomes Short: continue lungworks to aid with stress. Long: maintain a stress free environment.    Interventions Encouraged to attend Pulmonary Rehabilitation for the exercise    Continue Psychosocial Services  Follow up required by staff          Education: Education Goals: Education classes will be provided on a weekly basis, covering required topics. Participant will state understanding/return demonstration of topics presented.  Learning Barriers/Preferences:   General Pulmonary Education Topics:  Infection Prevention: - Provides verbal and written material to individual with discussion of infection control including proper hand washing and proper  equipment cleaning during exercise session. Flowsheet Row Pulmonary Rehab from 05/28/2024 in [facility] Cardiac and Pulmonary Rehab  Date 01/17/24  Educator NT  Instruction Review Code 1- Verbalizes Understanding    Falls Prevention: - Provides verbal and written material to individual with discussion of falls prevention and safety. Flowsheet Row Pulmonary Rehab from 05/28/2024 in [facility] Cardiac and Pulmonary Rehab  Date 01/17/24  Educator NT  Instruction Review Code 1- Verbalizes Understanding    Chronic Lung Disease Review: - Group verbal instruction with posters, models, PowerPoint presentations and videos,  to review new updates, new respiratory medications, new advancements in procedures and treatments. Providing information on websites and 800 numbers for continued self-education. Includes information about supplement oxygen , available portable oxygen  systems, continuous and intermittent flow rates, oxygen  safety, concentrators, and Medicare reimbursement for oxygen . Explanation of Pulmonary Drugs, including class, frequency, complications, importance of spacers, rinsing mouth after steroid MDI's, and proper cleaning methods for nebulizers. Review of basic lung anatomy and physiology related to function, structure, and complications of lung disease. Review of risk factors. Discussion about methods for diagnosing sleep apnea and types of masks and machines for OSA. Includes a review of the use of types of environmental controls: home humidity, furnaces, filters, dust mite/pet prevention, HEPA vacuums. Discussion about weather changes, air quality and the benefits of nasal washing. Instruction on Warning signs, infection symptoms, calling MD promptly, preventive modes, and value of vaccinations. Review of effective airway clearance, coughing and/or vibration techniques. Emphasizing that all should Create an Action Plan. Written material provided at class time. Flowsheet Row Pulmonary Rehab from  05/28/2024 in [facility] Cardiac and Pulmonary Rehab  Education need identified 01/17/24  Date 05/14/24  Educator jh  Instruction Review Code 1- Verbalizes Understanding    AED/CPR: - Group verbal and written instruction with the use of models to demonstrate the basic use of the AED with the basic ABC's of resuscitation.    Tests and Procedures:  - Group verbal and visual presentation and models provide information about basic cardiac anatomy and function. Reviews the testing methods done to diagnose heart disease and the outcomes of the test results. Describes the treatment choices: Medical Management, Angioplasty, or Coronary Bypass Surgery for treating various heart conditions including Myocardial Infarction, Angina, Valve Disease, and Cardiac Arrhythmias.  Written material provided at class time. Flowsheet Row Pulmonary Rehab from 05/28/2024 in [facility]  Cardiac and Pulmonary Rehab  Date 02/13/24  Educator [facility]  Instruction Review Code 1- Verbalizes Understanding    Medication Safety: - Group verbal and visual instruction to review commonly prescribed medications for heart and lung disease. Reviews the medication, class of the drug, and side effects. Includes the steps to properly store meds and maintain the prescription regimen.  Written material given at graduation. Flowsheet Row Pulmonary Rehab from 05/28/2024 in [facility] Cardiac and Pulmonary Rehab  Date 02/20/24  Educator sb  Instruction Review Code 1- Verbalizes Understanding    Other: -Provides group and verbal instruction on various topics (see comments)   Knowledge Questionnaire Score:  Knowledge Questionnaire Score - 05/16/24 1144       Knowledge Questionnaire Score   Pre Score 13/18    Post Score 16/18           Core Components/Risk Factors/Patient Goals at Admission:  Personal Goals and Risk Factors at Admission - 01/15/24 1307       Core Components/Risk Factors/Patient Goals on Admission    Weight Management Yes;Weight  Loss   fluid balance   Intervention Weight Management: Develop a combined nutrition and exercise program designed to reach desired caloric intake, while maintaining appropriate intake of nutrient and fiber, sodium and fats, and appropriate energy expenditure required for the weight goal.;Weight Management: Provide education and appropriate resources to help participant work on and attain dietary goals.;Weight Management/Obesity: Establish reasonable short term and long term weight goals.;Obesity: Provide education and appropriate resources to help participant work on and attain dietary goals.    Expected Outcomes Short Term: Continue to assess and modify interventions until short term weight is achieved;Long Term: Adherence to nutrition and physical activity/exercise program aimed toward attainment of established weight goal;Weight Loss: Understanding of general recommendations for a balanced deficit meal plan, which promotes 1-2 lb weight loss per week and includes a negative energy balance of [phone] kcal/d;Understanding recommendations for meals to include 15-35% energy as protein, 25-35% energy from fat, 35-60% energy from carbohydrates, less than 200mg  of dietary cholesterol, 20-35 gm of total fiber daily;Understanding of distribution of calorie intake throughout the day with the consumption of 4-5 meals/snacks    Improve shortness of breath with ADL's Yes    Intervention Provide education, individualized exercise plan and daily activity instruction to help decrease symptoms of SOB with activities of daily living.    Expected Outcomes Short Term: Improve cardiorespiratory fitness to achieve a reduction of symptoms when performing ADLs;Long Term: Be able to perform more ADLs without symptoms or delay the onset of symptoms    Diabetes Yes    Intervention Provide education about signs/symptoms and action to take for hypo/hyperglycemia.;Provide education about proper nutrition, including hydration, and  aerobic/resistive exercise prescription along with prescribed medications to achieve blood glucose in normal ranges: Fasting glucose 65-99 mg/dL    Expected Outcomes Short Term: Participant verbalizes understanding of the signs/symptoms and immediate care of hyper/hypoglycemia, proper foot care and importance of medication, aerobic/resistive exercise and nutrition plan for blood glucose control.;Long Term: Attainment of HbA1C < 7%.    Heart Failure Yes    Intervention Provide a combined exercise and nutrition program that is supplemented with education, support and counseling about heart failure. Directed toward relieving symptoms such as shortness of breath, decreased exercise tolerance, and extremity edema.    Expected Outcomes Improve functional capacity of life;Short term: Attendance in program 2-3 days a week with increased exercise capacity. Reported lower sodium intake. Reported increased fruit and vegetable intake. Reports medication  compliance.;Short term: Daily weights obtained and reported for increase. Utilizing diuretic protocols set by physician.;Long term: Adoption of self-care skills and reduction of barriers for early signs and symptoms recognition and intervention leading to self-care maintenance.          Education:Diabetes - Individual verbal and written instruction to review signs/symptoms of diabetes, desired ranges of glucose level fasting, after meals and with exercise. Acknowledge that pre and post exercise glucose checks will be done for 3 sessions at entry of program. Flowsheet Row Pulmonary Rehab from 08/23/2022 in [facility] Cardiac and Pulmonary Rehab  Date 05/15/22  Educator [facility]  Instruction Review Code 1- Verbalizes Understanding    Know Your Numbers and Heart Failure: - Group verbal and visual instruction to discuss disease risk factors for cardiac and pulmonary disease and treatment options.  Reviews associated critical values for Overweight/Obesity, Hypertension,  Cholesterol, and Diabetes.  Discusses basics of heart failure: signs/symptoms and treatments.  Introduces Heart Failure Zone chart for action plan for heart failure. Written material provided at class time. Flowsheet Row Pulmonary Rehab from 05/28/2024 in [facility] Cardiac and Pulmonary Rehab  Date 05/07/24  Educator kb  Instruction Review Code 1- Verbalizes Understanding    Core Components/Risk Factors/Patient Goals Review:   Goals and Risk Factor Review     Row Name 03/17/24 1133 04/07/24 1153           Core Components/Risk Factors/Patient Goals Review   Personal Goals Review Improve shortness of breath with ADL's Weight Management/Obesity      Review Spoke to patient about their shortness of breath and what they can do to improve. Patient has been informed of breathing techniques when starting the program. Patient is informed to tell staff if they have had any med changes and that certain meds they are taking or not taking can be causing shortness of breath. Amber Holder states she would like to lose some weight and has been trying to changer her diet more. She does not use her CPAP routinely which could hinder her losing weight.      Expected Outcomes Short: Attend [facility] regularly to improve shortness of breath with ADL's. Long: maintain independence with ADL's Short: try to use CPAP. Long: reach her weight goal.         Core Components/Risk Factors/Patient Goals at Discharge (Final Review):   Goals and Risk Factor Review - 04/07/24 1153       Core Components/Risk Factors/Patient Goals Review   Personal Goals Review Weight Management/Obesity    Review Amber Holder states she would like to lose some weight and has been trying to changer her diet more. She does not use her CPAP routinely which could hinder her losing weight.    Expected Outcomes Short: try to use CPAP. Long: reach her weight goal.          ITP Comments:  ITP Comments     Row Name 01/15/24 1320 01/17/24 1145 01/23/24 1133  01/30/24 1050 02/27/24 1055   ITP Comments Initial phone call completed. Diagnosis can be found in [facility] 5/12. EP Orientation scheduled for Thursday 5/22 at 9:30am. Completed and gym orientation for respiratory care services. Initial ITP created and sent for review to Dr. Faud Aleskerov, Medical Director. First full day of exercise!  Patient was oriented to gym and equipment including functions, settings, policies, and procedures.  Patient's individual exercise prescription and treatment plan were reviewed.  All starting workloads were established based on the results of the 6 minute walk test done at initial orientation  visit.  The plan for exercise progression was also introduced and progression will be customized based on patient's performance and goals. 30 Day review completed. Medical Director ITP review done, changes made as directed, and signed approval by Medical Director.    new to program 30 Day review completed. Medical Director ITP review done, changes made as directed, and signed approval by Medical Director.    Row Name 03/26/24 1005 04/23/24 1024 05/21/24 1111 06/02/24 1110     ITP Comments 30 Day review completed. Medical Director ITP review done, changes made as directed, and signed approval by Medical Director. 30 Day review completed. Medical Director ITP review done, changes made as directed, and signed approval by Medical Director. 30 Day review completed. Medical Director ITP review done, changes made as directed, and signed approval by Medical Director. Klaire graduated today from  rehab with 36 sessions completed.  Details of the patient's exercise prescription and what She needs to do in order to continue the prescription and progress were discussed with patient.  Patient was given a copy of prescription and goals.  Patient verbalized understanding. Britiny plans to continue to exercise by going to the [facility].       Comments: discharge ITP

## 2024-06-02 NOTE — Progress Notes (Signed)
 Discharge Summary Patient: Amber Holder DOB: 1945/11/25   [name] graduated today from  rehab with 36 sessions completed.  Details of the patient's exercise prescription and what She needs to do in order to continue the prescription and progress were discussed with patient.  Patient was given a copy of prescription and goals.  Patient verbalized understanding. [name] plans to continue to exercise by going to the [facility].   6 Minute Walk     Row Name 01/17/24 1151 05/14/24 1138       6 Minute Walk   Phase Initial Discharge    Distance 1050 feet 1200 feet    Distance % Change -- 14.29 %    Distance Feet Change -- 150 ft    Walk Time 6 minutes 6 minutes    # of Rest Breaks 0 0    MPH 1.99 2.27    METS 1.58 1.85    RPE 13 14    Perceived Dyspnea  2 3    VO2 Peak 5.51 6.48    Symptoms Yes (comment) Yes (comment)    Comments R leg weakness 2/10 dull R hip pain    Resting HR 62 bpm 72 bpm    Resting BP 132/64 110/60    Resting Oxygen  Saturation  93 % 88 %    Exercise Oxygen  Saturation  during 6 min walk 87 % 85 %    Max Ex. HR 84 bpm 92 bpm    Max Ex. BP 132/64 120/54    2 Minute Post BP 114/58 106/60      Interval HR   1 Minute HR 76 82    2 Minute HR 80 88    3 Minute HR 81 90    4 Minute HR 81 91    5 Minute HR 84 91    6 Minute HR 83 92    2 Minute Post HR 63 72    Interval Heart Rate? Yes Yes      Interval Oxygen    Interval Oxygen ? Yes Yes    Baseline Oxygen  Saturation % 93 % 88 %    1 Minute Oxygen  Saturation % 91 % 89 %    1 Minute Liters of Oxygen  2 L 3 L    2 Minute Oxygen  Saturation % 88 % 88 %    2 Minute Liters of Oxygen  2 L 3 L    3 Minute Oxygen  Saturation % 87 % 85 %    3 Minute Liters of Oxygen  2 L 3 L    4 Minute Oxygen  Saturation % 87 % 86 %    4 Minute Liters of Oxygen  2 L 3 L    5 Minute Oxygen  Saturation % 87 % 85 %    5 Minute Liters of Oxygen  2 L 3 L    6 Minute Oxygen  Saturation % 87 % 87 %    6 Minute Liters of Oxygen  2 L 3 L    2 Minute Post  Oxygen  Saturation % 94 % 95 %    2 Minute Post Liters of Oxygen  2 L 3 L

## 2024-06-12 ENCOUNTER — Encounter: Payer: Self-pay | Admitting: Physician Assistant

## 2024-06-12 ENCOUNTER — Ambulatory Visit (INDEPENDENT_AMBULATORY_CARE_PROVIDER_SITE_OTHER): Payer: Medicare Other | Admitting: Physician Assistant

## 2024-06-12 VITALS — BP 120/68 | HR 73 | Temp 98.3°F | Resp 16 | Ht 65.0 in | Wt 188.0 lb

## 2024-06-12 DIAGNOSIS — Z0000 Encounter for general adult medical examination without abnormal findings: Secondary | ICD-10-CM

## 2024-06-12 DIAGNOSIS — F4321 Adjustment disorder with depressed mood: Secondary | ICD-10-CM | POA: Diagnosis not present

## 2024-06-12 DIAGNOSIS — E538 Deficiency of other specified B group vitamins: Secondary | ICD-10-CM | POA: Diagnosis not present

## 2024-06-12 DIAGNOSIS — E1165 Type 2 diabetes mellitus with hyperglycemia: Secondary | ICD-10-CM

## 2024-06-12 DIAGNOSIS — Z634 Disappearance and death of family member: Secondary | ICD-10-CM

## 2024-06-12 DIAGNOSIS — R300 Dysuria: Secondary | ICD-10-CM

## 2024-06-12 DIAGNOSIS — R5383 Other fatigue: Secondary | ICD-10-CM

## 2024-06-12 DIAGNOSIS — Z1329 Encounter for screening for other suspected endocrine disorder: Secondary | ICD-10-CM

## 2024-06-12 DIAGNOSIS — E559 Vitamin D deficiency, unspecified: Secondary | ICD-10-CM | POA: Diagnosis not present

## 2024-06-12 NOTE — Progress Notes (Signed)
 [facility] [address] [place], [place] [address]  Internal MEDICINE  Office Visit Note  Patient Name: Amber Holder  [id number]  [id number]  Date of Service: 06/12/2024  Chief Complaint  Patient presents with   Medicare Wellness   Diabetes   Gastroesophageal Reflux   Family Problem    Patient's son passed away recently due to heart attack, has impacted sleep and overall health    HPI Gracia presents for an annual well visit Well-appearing 78 y.o.female Labs: ordered New or worsening pain: some pain from neck/shoulderr into right arm, she is following up with ortho Other concerns: -Son passed suddenly from a heat attack on 05/20/24, states she was on a cruise with him and his wife when this occurred. They had the celebration of life on "Sunday. Sleep erratic due to grief, but does not want medication for this. States she is managing -had once instance of vision changes, feels like it can be hard to focus. Thinks this was stress. She put some eye drops in and felt like this helped. States she had been seen prior to this for slight changes and eye doctor did not find anything. She has another follow up in Feb, but if this instance happens again she will call/ seek further evaluation -states she plans to go to [place] at end of the month, but grandson expressed concerns due to high altitude and pt's breathing status so she would like to ask Dr. Khan, her pulmonologist about this     06/07/2023    9:06 AM 05/19/2022   10:13 AM 05/13/2021   11:14 AM  MMSE - Mini Mental State Exam  Orientation to time 5 5 5  Orientation to Place 5 5 5  Registration 3 3 3  Attention/ Calculation 5 5 5  Recall 3 3 3  Language- name 2 objects 2 2 2  Language- repeat 1 1 1  Language- follow 3 step command 3 3 3  Language- read & follow direction 1 1 1  Write a sentence 1 1 1  Copy design 1 1 1  Total score 30 30 30    Functional Status Survey:       10" /05/2023    9:07 AM  06/07/2023    9:09 AM 11/01/2023   10:26 AM 01/15/2024    1:02 PM 03/06/2024    9:48 AM  Fall Risk  Falls in the past year? 0 0 0 0 0  Was there an injury with Fall?    0   Fall Risk Category Calculator    0   Patient at Risk for Falls Due to No Fall Risks No Fall Risks          05/16/2024   11:42 AM  Depression screen PHQ 2/9  Decreased Interest 1  Down, Depressed, Hopeless 1  PHQ - 2 Score 2  Altered sleeping 1  Tired, decreased energy 2  Change in appetite 0  Feeling bad or failure about yourself  0  Trouble concentrating 0  Moving slowly or fidgety/restless 0  Suicidal thoughts 0  PHQ-9 Score 5  Difficult doing work/chores Not difficult at all        No data to display            Current Medication: Outpatient Encounter Medications as of 06/12/2024  Medication Sig   acetaminophen  (TYLENOL ) 500 MG chewable tablet Chew 1,000 mg by mouth every 8 (eight) hours as needed for pain.   albuterol  (VENTOLIN  HFA) 108 (90 Base)  MCG/ACT inhaler Inhale 2 puffs into the lungs every 6 (six) hours as needed for wheezing or shortness of breath.   aspirin  EC 81 MG tablet Take 1 tablet (81 mg total) by mouth daily. Swallow whole.   cholecalciferol  (VITAMIN D ) 400 UNITS TABS tablet Take 2,000 Units by mouth daily.   CRANBERRY PO Take 25,000 mg by mouth daily.   diclofenac  Sodium (VOLTAREN ) 1 % GEL Apply small amount to area of pain daily as needed   ergocalciferol  (DRISDOL ) 1.25 MG (50000 UT) capsule Take one cap q week   estradiol  (ESTRACE ) 0.1 MG/GM vaginal cream Estrogen Cream Instruction Discard applicator Apply pea sized amount to tip of finger to urethra before bed. Wash hands well after application. Use Monday, Wednesday and Friday   ezetimibe  (ZETIA ) 10 MG tablet Take 1 tablet (10 mg total) by mouth daily.   FARXIGA  10 MG TABS tablet TAKE 1 TABLET BY MOUTH DAILY BEFORE BREAKFAST.   fluconazole  (DIFLUCAN ) 150 MG tablet Take one tab po q week for fungal infection and as needed    Fluticasone -Umeclidin-Vilant (TRELEGY ELLIPTA ) 100-62.5-25 MCG/ACT AEPB Use inhale  orally daily   glucose blood (ONETOUCH VERIO) test strip BLOOD SUGAR TESTING ONCE DAILY . DX E11.65   hydrocortisone  cream 0.5 % Apply topically as needed.   ibandronate  (BONIVA ) 150 MG tablet TAKE 1 TABLET (150 MG TOTAL) BY MOUTH EVERY 30 (THIRTY) DAYS.   ketoconazole (NIZORAL) 2 % cream Apply 1 Application topically daily.   Lancets (ONETOUCH DELICA PLUS LANCET30G) MISC Use  as directed twice a daily DX E11.65   macitentan  (OPSUMIT ) 10 MG tablet Take 1 tablet (10 mg total) by mouth daily.   Magnesium  250 MG TABS Take by mouth. Takes 1 tablet 2-3 times per week   montelukast  (SINGULAIR ) 10 MG tablet Take 1 tablet (10 mg total) by mouth daily as needed.   Multiple Vitamins-Minerals (MULTIVITAMIN ADULTS PO) Take 1 tablet by mouth daily.   omeprazole  (PRILOSEC) 40 MG capsule TAKE 1 CAPSULE (40 MG TOTAL) BY MOUTH DAILY.   OXYGEN  Inhale 3 L into the lungs. Pt uses [facility] for Oxygen    potassium chloride  SA (KLOR-CON  M) 20 MEQ tablet Take 2 tablets (40 mEq total) by mouth daily.   spironolactone  (ALDACTONE ) 25 MG tablet Take 1 tablet (25 mg total) by mouth daily.   torsemide  (DEMADEX ) 20 MG tablet TAKE 2 TABLETS (40MG ) BY MOUTH DAILY   Facility-Administered Encounter Medications as of 06/12/2024  Medication   nitrofurantoin  (macrocrystal-monohydrate) (MACROBID ) capsule 100 mg    Surgical History: Past Surgical History:  Procedure Laterality Date   APPENDECTOMY     cataract surgery Bilateral    CHOLECYSTECTOMY     COLONOSCOPY     COLONOSCOPY WITH PROPOFOL  N/A 05/28/2015   Procedure: COLONOSCOPY WITH PROPOFOL ;  Surgeon: Gladis RAYMOND Mariner, MD;  Location: [facility] ENDOSCOPY;  Service: Endoscopy;  Laterality: N/A;   COLONOSCOPY WITH PROPOFOL  N/A 10/28/2018   Procedure: COLONOSCOPY WITH PROPOFOL ;  Surgeon: Mariner Gladis RAYMOND, MD;  Location: [facility] ENDOSCOPY;  Service: Endoscopy;  Laterality: N/A;   EYE SURGERY  Bilateral 2015   Cataract Extraction with IOL   LITHOTRIPSY  2015   has had many stones   Mastoidotomy  1970   ROTATOR CUFF REPAIR Right 2011   TOTAL HIP ARTHROPLASTY Right 06/14/2016   Procedure: TOTAL HIP ARTHROPLASTY;  Surgeon: Lynwood SHAUNNA Hue, MD;  Location: [facility] ORS;  Service: Orthopedics;  Laterality: Right;    Medical History: Past Medical History:  Diagnosis Date   Anxiety  01/02/2012   Arthritis    Asthma    Blockage of coronary artery of heart (HCC)    Chronic kidney disease    Kidney stones   Colon polyps    COPD (chronic obstructive pulmonary disease) (HCC)    Diabetes mellitus without complication (HCC)    GERD (gastroesophageal reflux disease)    HOH (hard of hearing)    Bilateral hearing aids   Kidney stone    Osteoporosis    Sleep apnea    Does not use C-PAP on a regular basis   Urinary tract infection    Venous stasis     Family History: Family History  Problem Relation Age of Onset   Breast cancer Maternal Aunt 50   Breast cancer Maternal Aunt    Bladder Cancer Neg Hx    Kidney cancer Neg Hx     Social History   Socioeconomic History   Marital status: Divorced    Spouse name: Not on file   Number of children: Not on file   Years of education: Not on file   Highest education level: Not on file  Occupational History   Not on file  Tobacco Use   Smoking status: Former    Current packs/day: 0.00    Average packs/day: 1 pack/day for 50.0 years (50.0 ttl pk-yrs)    Types: Cigarettes    Start date: 12/29/1971    Quit date: 12/28/2021    Years since quitting: 2.4    Passive exposure: Never   Smokeless tobacco: Never  Vaping Use   Vaping status: Never Used  Substance and Sexual Activity   Alcohol  use: Not Currently    Comment: very rarely    Drug use: No   Sexual activity: Not Currently    Birth control/protection: Post-menopausal  Other Topics Concern   Not on file  Social History Narrative   Not on file   Social Drivers of Health    Financial Resource Strain: Not on file  Food Insecurity: Not on file  Transportation Needs: Not on file  Physical Activity: Insufficiently Active (09/21/2022)   Exercise Vital Sign    Days of Exercise per Week: 3 days    Minutes of Exercise per Session: 30 min  Stress: Not on file  Social Connections: Not on file  Intimate Partner Violence: Not on file      Review of Systems  Constitutional:  Negative for chills, fatigue and unexpected weight change.  HENT:  Positive for postnasal drip. Negative for congestion, rhinorrhea, sneezing and sore throat.   Eyes:  Negative for redness.  Respiratory:  Negative for chest tightness, shortness of breath and wheezing.   Cardiovascular:  Negative for chest pain and palpitations.  Gastrointestinal:  Negative for abdominal pain, constipation, nausea and vomiting.  Genitourinary:  Negative for dysuria and frequency.  Musculoskeletal:  Positive for arthralgias. Negative for back pain, joint swelling and neck pain.  Skin:  Negative for rash.  Neurological: Negative.  Negative for tremors and light-headedness.  Hematological:  Negative for adenopathy. Does not bruise/bleed easily.  Psychiatric/Behavioral:  Positive for dysphoric mood and sleep disturbance. Negative for behavioral problems (Depression) and suicidal ideas. The patient is not nervous/anxious.     Vital Signs: BP 120/68   Pulse 73   Temp 98.3 F (36.8 C)   Resp 16   Ht 5' 5 (1.651 m)   Wt 188 lb (85.3 kg)   SpO2 93%   BMI 31.28 kg/m    Physical Exam Vitals and nursing note  reviewed.  Constitutional:      Appearance: Normal appearance.  HENT:     Head: Normocephalic and atraumatic.  Eyes:     Extraocular Movements: Extraocular movements intact.  Cardiovascular:     Rate and Rhythm: Normal rate and regular rhythm.     Pulses: Normal pulses.     Heart sounds: Normal heart sounds.  Pulmonary:     Effort: Pulmonary effort is normal.     Breath sounds: Normal breath  sounds.  Musculoskeletal:     Right lower leg: Edema present.     Left lower leg: Edema present.     Comments: Wearing compression stockings  Skin:    General: Skin is warm and dry.  Neurological:     General: No focal deficit present.     Mental Status: She is alert.  Psychiatric:        Behavior: Behavior normal.        Assessment/Plan: 1. Encounter for annual wellness exam in Medicare patient (Primary) AWV performed, labs ordered  2. Grief at loss of child Pt declines any meds or counseling  3. Type 2 diabetes mellitus with hyperglycemia, without long-term current use of insulin  (HCC) - Urine Microalbumin w/creat. ratio - Hgb A1C w/o eAG  4. B12 deficiency - B12 and Folate Panel  5. Vitamin D  deficiency - VITAMIN D  25 Hydroxy (Vit-D Deficiency, Fractures)  6. Thyroid  disorder screening - TSH + free T4  7. Other fatigue - CBC w/Diff/Platelet - Comprehensive metabolic panel with GFR - B12 and Folate Panel - TSH + free T4 - VITAMIN D  25 Hydroxy (Vit-D Deficiency, Fractures) - Hgb A1C w/o eAG  8. Dysuria - UA/M w/rflx Culture, Routine     General Counseling: Kashvi verbalizes understanding of the findings of todays visit and agrees with plan of treatment. I have discussed any further diagnostic evaluation that may be needed or ordered today. We also reviewed her medications today. she has been encouraged to call the office with any questions or concerns that should arise related to todays visit.    Orders Placed This Encounter  Procedures   Microscopic Examination   Urine Culture, Reflex   UA/M w/rflx Culture, Routine   Urine Microalbumin w/creat. ratio   CBC w/Diff/Platelet   Comprehensive metabolic panel with GFR   B12 and Folate Panel   TSH + free T4   VITAMIN D  25 Hydroxy (Vit-D Deficiency, Fractures)   Hgb A1C w/o eAG   UA/M w/rflx Culture, Routine   Specimen status report    No orders of the defined types were placed in this  encounter.   Return in about 4 months (around 10/13/2024) for general follow up.   Total time spent:35 Minutes Time spent includes review of chart, medications, test results, and follow up plan with the patient.   [place] Controlled Substance Database was reviewed by me.  This patient was seen by Tinnie Pro, PA-C in collaboration with Dr. Sigrid Bathe as a part of collaborative care agreement.  Tinnie Pro, PA-C Internal medicine

## 2024-06-13 ENCOUNTER — Other Ambulatory Visit
Admission: RE | Admit: 2024-06-13 | Discharge: 2024-06-13 | Disposition: A | Discharge status: Home / Self Care | Attending: Physician Assistant | Admitting: Physician Assistant

## 2024-06-13 DIAGNOSIS — E1165 Type 2 diabetes mellitus with hyperglycemia: Secondary | ICD-10-CM | POA: Diagnosis not present

## 2024-06-13 DIAGNOSIS — E559 Vitamin D deficiency, unspecified: Secondary | ICD-10-CM | POA: Insufficient documentation

## 2024-06-13 DIAGNOSIS — E538 Deficiency of other specified B group vitamins: Secondary | ICD-10-CM | POA: Insufficient documentation

## 2024-06-13 DIAGNOSIS — Z1329 Encounter for screening for other suspected endocrine disorder: Secondary | ICD-10-CM | POA: Insufficient documentation

## 2024-06-13 DIAGNOSIS — R5383 Other fatigue: Secondary | ICD-10-CM | POA: Diagnosis not present

## 2024-06-13 LAB — COMPREHENSIVE METABOLIC PANEL WITH GFR
ALT: 20 U/L (ref 0–44)
AST: 22 U/L (ref 15–41)
Albumin: 4.2 g/dL (ref 3.5–5.0)
Alkaline Phosphatase: 53 U/L (ref 38–126)
Anion gap: 14 (ref 5–15)
BUN: 20 mg/dL (ref 8–23)
CO2: 28 mmol/L (ref 22–32)
Calcium: 9.9 mg/dL (ref 8.9–10.3)
Chloride: 99 mmol/L (ref 98–111)
Creatinine, Ser: 0.91 mg/dL (ref 0.44–1.00)
GFR, Estimated: >60 mL/min (ref >60)
Glucose, Bld: 118 mg/dL — ABNORMAL HIGH (ref 70–99)
Potassium: 4.2 mmol/L (ref 3.5–5.1)
Sodium: 141 mmol/L (ref 135–145)
Total Bilirubin: 0.6 mg/dL (ref 0.0–1.2)
Total Protein: 7.4 g/dL (ref 6.5–8.1)

## 2024-06-13 LAB — MICROALBUMIN / CREATININE URINE RATIO
Creatinine, Urine: 52.7 mg/dL
Microalb/Creat Ratio: 57 mg/g{creat} — ABNORMAL HIGH (ref 0–29)
Microalbumin, Urine: 30.2 ug/mL

## 2024-06-13 LAB — T4, FREE: Free T4: 0.92 ng/dL (ref 0.61–1.12)

## 2024-06-13 LAB — CBC WITH DIFFERENTIAL/PLATELET
Abs Immature Granulocytes: 0.02 K/uL (ref 0.00–0.07)
Basophils Absolute: 0 K/uL (ref 0.0–0.1)
Basophils Relative: 0 %
Eosinophils Absolute: 0.1 K/uL (ref 0.0–0.5)
Eosinophils Relative: 1 %
HCT: 48.5 % — ABNORMAL HIGH (ref 36.0–46.0)
Hemoglobin: 15.4 g/dL — ABNORMAL HIGH (ref 12.0–15.0)
Immature Granulocytes: 0 %
Lymphocytes Relative: 41 %
Lymphs Abs: 4.2 K/uL — ABNORMAL HIGH (ref 0.7–4.0)
MCH: 29 pg (ref 26.0–34.0)
MCHC: 31.8 g/dL (ref 30.0–36.0)
MCV: 91.3 fL (ref 80.0–100.0)
Monocytes Absolute: 0.7 K/uL (ref 0.1–1.0)
Monocytes Relative: 6 %
Neutro Abs: 5.2 K/uL (ref 1.7–7.7)
Neutrophils Relative %: 52 %
Platelets: 253 K/uL (ref 150–400)
RBC: 5.31 MIL/uL — ABNORMAL HIGH (ref 3.87–5.11)
RDW: 13.7 % (ref 11.5–15.5)
WBC: 10.1 K/uL (ref 4.0–10.5)
nRBC: 0 % (ref 0.0–0.2)

## 2024-06-13 LAB — UA/M W/RFLX CULTURE, ROUTINE

## 2024-06-13 LAB — VITAMIN D 25 HYDROXY (VIT D DEFICIENCY, FRACTURES): Vit D, 25-Hydroxy: 24.21 ng/mL — ABNORMAL LOW (ref 30–100)

## 2024-06-13 LAB — HEMOGLOBIN A1C
Hgb A1c MFr Bld: 6.4 % — ABNORMAL HIGH (ref 4.8–5.6)
Mean Plasma Glucose: 136.98 mg/dL

## 2024-06-13 LAB — TSH: TSH: 2.113 u[IU]/mL (ref 0.350–4.500)

## 2024-06-13 LAB — FOLATE: Folate: >20 ng/mL (ref >5.9)

## 2024-06-13 LAB — VITAMIN B12: Vitamin B-12: 394 pg/mL (ref 180–914)

## 2024-06-16 LAB — UA/M W/RFLX CULTURE, ROUTINE
Nitrite, UA: POSITIVE — AB
Specific Gravity, UA: 1.014 (ref 1.005–1.030)
Urobilinogen, Ur: 0.2 mg/dL (ref 0.2–1.0)
pH, UA: 5.5 (ref 5.0–7.5)

## 2024-06-16 LAB — MICROSCOPIC EXAMINATION
Casts: NONE SEEN /LPF
Epithelial Cells (non renal): NONE SEEN /HPF (ref 0–10)
WBC, UA: >30 /HPF — AB (ref 0–5)

## 2024-06-16 LAB — URINE CULTURE, REFLEX

## 2024-06-16 LAB — SPECIMEN STATUS REPORT

## 2024-06-17 ENCOUNTER — Ambulatory Visit: Payer: Self-pay | Admitting: Physician Assistant

## 2024-06-17 ENCOUNTER — Ambulatory Visit

## 2024-06-17 DIAGNOSIS — N390 Urinary tract infection, site not specified: Secondary | ICD-10-CM

## 2024-06-17 DIAGNOSIS — Z23 Encounter for immunization: Secondary | ICD-10-CM | POA: Diagnosis not present

## 2024-06-17 NOTE — Progress Notes (Signed)
 Pt here to repeat urine culture due to [facility] cancelling recent test. Also spoke to patient regarding labs and per DSK ok to travel to [place]  if taking O2 with her.

## 2024-06-20 ENCOUNTER — Ambulatory Visit: Payer: Self-pay | Admitting: Physician Assistant

## 2024-06-20 DIAGNOSIS — N390 Urinary tract infection, site not specified: Secondary | ICD-10-CM

## 2024-06-20 LAB — CULTURE, URINE COMPREHENSIVE

## 2024-06-20 MED ORDER — NITROFURANTOIN MONOHYD MACRO 100 MG PO CAPS: ORAL_CAPSULE | ORAL | 0 refills | Status: AC

## 2024-07-01 ENCOUNTER — Other Ambulatory Visit: Payer: Self-pay | Admitting: Urology

## 2024-07-01 DIAGNOSIS — N390 Urinary tract infection, site not specified: Secondary | ICD-10-CM

## 2024-07-02 DIAGNOSIS — Z96641 Presence of right artificial hip joint: Secondary | ICD-10-CM | POA: Diagnosis not present

## 2024-07-07 ENCOUNTER — Ambulatory Visit: Payer: MEDICARE | Admitting: Internal Medicine

## 2024-07-07 ENCOUNTER — Encounter: Payer: Self-pay | Admitting: Internal Medicine

## 2024-07-07 VITALS — BP 114/59 | HR 83 | Temp 98.0°F | Resp 16 | Ht 65.0 in | Wt 187.0 lb

## 2024-07-07 DIAGNOSIS — D751 Secondary polycythemia: Secondary | ICD-10-CM | POA: Diagnosis not present

## 2024-07-07 DIAGNOSIS — Z9981 Dependence on supplemental oxygen: Secondary | ICD-10-CM

## 2024-07-07 DIAGNOSIS — J9611 Chronic respiratory failure with hypoxia: Secondary | ICD-10-CM

## 2024-07-07 DIAGNOSIS — I2721 Secondary pulmonary arterial hypertension: Secondary | ICD-10-CM | POA: Diagnosis not present

## 2024-07-07 DIAGNOSIS — J4489 Other specified chronic obstructive pulmonary disease: Secondary | ICD-10-CM | POA: Diagnosis not present

## 2024-07-07 NOTE — Progress Notes (Signed)
 [facility] [address] [place], [place] [address]  Pulmonary Sleep Medicine   Office Visit Note  Patient Name: Amber Holder DOB: 1945-11-11 MRN [id number]  Date of Service: 07/07/2024  Complaints/HPI: She states she is doing a little better now. She was apparently in [place]  and went to [facility]. She had a lot of difficulty during that trip in terms of her breathing. She had her oxygen  but despite this she had difficulty with the altitude. We had a conversation regarding expectations and prognosis about her disease. She has pain occasionally when she is not active.  Office Spirometry Results:     ROS  General: (-) fever, (-) chills, (-) night sweats, (-) weakness Skin: (-) rashes, (-) itching,. Eyes: (-) visual changes, (-) redness, (-) itching. Nose and Sinuses: (-) nasal stuffiness or itchiness, (-) postnasal drip, (-) nosebleeds, (-) sinus trouble. Mouth and Throat: (-) sore throat, (-) hoarseness. Neck: (-) swollen glands, (-) enlarged thyroid , (-) neck pain. Respiratory: - cough, (-) bloody sputum, + shortness of breath, - wheezing. Cardiovascular: + ankle swelling, (-) chest pain. Lymphatic: (-) lymph node enlargement. Neurologic: (-) numbness, (-) tingling. Psychiatric: (-) anxiety, (-) depression   Current Medication: Outpatient Encounter Medications as of 07/07/2024  Medication Sig   acetaminophen  (TYLENOL ) 500 MG chewable tablet Chew 1,000 mg by mouth every 8 (eight) hours as needed for pain.   albuterol  (VENTOLIN  HFA) 108 (90 Base) MCG/ACT inhaler Inhale 2 puffs into the lungs every 6 (six) hours as needed for wheezing or shortness of breath.   aspirin  EC 81 MG tablet Take 1 tablet (81 mg total) by mouth daily. Swallow whole.   cholecalciferol  (VITAMIN D ) 400 UNITS TABS tablet Take 2,000 Units by mouth daily.   CRANBERRY PO Take 25,000 mg by mouth daily.   diclofenac  Sodium (VOLTAREN ) 1 % GEL Apply small amount to area of pain daily as needed    ergocalciferol  (DRISDOL ) 1.25 MG (50000 UT) capsule Take one cap q week   estradiol  (ESTRACE ) 0.01 % CREA vaginal cream ESTROGEN CREAM INSTRUCTION DISCARD APPLICATOR APPLY PEA SIZED AMOUNT TO TIP OF FINGER TO URETHRA BEFORE BED. WASH HANDS WELL AFTER APPLICATION. USE MONDAY, WEDNESDAY AND FRIDAY   ezetimibe  (ZETIA ) 10 MG tablet Take 1 tablet (10 mg total) by mouth daily.   FARXIGA  10 MG TABS tablet TAKE 1 TABLET BY MOUTH DAILY BEFORE BREAKFAST.   fluconazole  (DIFLUCAN ) 150 MG tablet Take one tab po q week for fungal infection and as needed   Fluticasone -Umeclidin-Vilant (TRELEGY ELLIPTA ) 100-62.5-25 MCG/ACT AEPB Use inhale  orally daily   glucose blood (ONETOUCH VERIO) test strip BLOOD SUGAR TESTING ONCE DAILY . DX E11.65   hydrocortisone  cream 0.5 % Apply topically as needed.   ibandronate  (BONIVA ) 150 MG tablet TAKE 1 TABLET (150 MG TOTAL) BY MOUTH EVERY 30 (THIRTY) DAYS.   ketoconazole (NIZORAL) 2 % cream Apply 1 Application topically daily.   Lancets (ONETOUCH DELICA PLUS LANCET30G) MISC Use  as directed twice a daily DX E11.65   macitentan  (OPSUMIT ) 10 MG tablet Take 1 tablet (10 mg total) by mouth daily.   Magnesium  250 MG TABS Take by mouth. Takes 1 tablet 2-3 times per week   montelukast  (SINGULAIR ) 10 MG tablet Take 1 tablet (10 mg total) by mouth daily as needed.   Multiple Vitamins-Minerals (MULTIVITAMIN ADULTS PO) Take 1 tablet by mouth daily.   nitrofurantoin , macrocrystal-monohydrate, (MACROBID ) 100 MG capsule Take 1 cap twice per day for 7 days.   omeprazole  (PRILOSEC) 40 MG capsule  TAKE 1 CAPSULE (40 MG TOTAL) BY MOUTH DAILY.   OXYGEN  Inhale 3 L into the lungs. Pt uses [facility] for Oxygen    potassium chloride  SA (KLOR-CON  M) 20 MEQ tablet Take 2 tablets (40 mEq total) by mouth daily.   spironolactone  (ALDACTONE ) 25 MG tablet Take 1 tablet (25 mg total) by mouth daily.   torsemide  (DEMADEX ) 20 MG tablet TAKE 2 TABLETS (40MG ) BY MOUTH DAILY   [DISCONTINUED]  nitrofurantoin  (macrocrystal-monohydrate) (MACROBID ) capsule 100 mg    No facility-administered encounter medications on file as of 07/07/2024.    Surgical History: Past Surgical History:  Procedure Laterality Date   APPENDECTOMY     cataract surgery Bilateral    CHOLECYSTECTOMY     COLONOSCOPY     COLONOSCOPY WITH PROPOFOL  N/A 05/28/2015   Procedure: COLONOSCOPY WITH PROPOFOL ;  Surgeon: Gladis RAYMOND Mariner, MD;  Location: [facility] ENDOSCOPY;  Service: Endoscopy;  Laterality: N/A;   COLONOSCOPY WITH PROPOFOL  N/A 10/28/2018   Procedure: COLONOSCOPY WITH PROPOFOL ;  Surgeon: Mariner Gladis RAYMOND, MD;  Location: [facility] ENDOSCOPY;  Service: Endoscopy;  Laterality: N/A;   EYE SURGERY Bilateral 2015   Cataract Extraction with IOL   LITHOTRIPSY  2015   has had many stones   Mastoidotomy  1970   ROTATOR CUFF REPAIR Right 2011   TOTAL HIP ARTHROPLASTY Right 06/14/2016   Procedure: TOTAL HIP ARTHROPLASTY;  Surgeon: Lynwood SHAUNNA Hue, MD;  Location: [facility] ORS;  Service: Orthopedics;  Laterality: Right;    Medical History: Past Medical History:  Diagnosis Date   Anxiety 01/02/2012   Arthritis    Asthma    Blockage of coronary artery of heart (HCC)    Chronic kidney disease    Kidney stones   Colon polyps    COPD (chronic obstructive pulmonary disease) (HCC)    Diabetes mellitus without complication (HCC)    GERD (gastroesophageal reflux disease)    HOH (hard of hearing)    Bilateral hearing aids   Kidney stone    Osteoporosis    Sleep apnea    Does not use C-PAP on a regular basis   Urinary tract infection    Venous stasis     Family History: Family History  Problem Relation Age of Onset   Breast cancer Maternal Aunt 50   Breast cancer Maternal Aunt    Bladder Cancer Neg Hx    Kidney cancer Neg Hx     Social History: Social History   Socioeconomic History   Marital status: Divorced    Spouse name: Not on file   Number of children: Not on file   Years of education: Not on file    Highest education level: Not on file  Occupational History   Not on file  Tobacco Use   Smoking status: Former    Current packs/day: 0.00    Average packs/day: 1 pack/day for 50.0 years (50.0 ttl pk-yrs)    Types: Cigarettes    Start date: 12/29/1971    Quit date: 12/28/2021    Years since quitting: 2.5    Passive exposure: Never   Smokeless tobacco: Never  Vaping Use   Vaping status: Never Used  Substance and Sexual Activity   Alcohol  use: Not Currently    Comment: very rarely    Drug use: No   Sexual activity: Not Currently    Birth control/protection: Post-menopausal  Other Topics Concern   Not on file  Social History Narrative   Not on file   Social Drivers of Health  Financial Resource Strain: Low Risk  (07/02/2024)   Received from [facility]   Overall Financial Resource Strain (CARDIA)    Difficulty of Paying Living Expenses: Not hard at all  Food Insecurity: No Food Insecurity (07/02/2024)   Received from [facility]   Hunger Vital Sign    Within the past 12 months, you worried that your food would run out before you got the money to buy more.: Never true    Within the past 12 months, the food you bought just didn't last and you didn't have money to get more.: Never true  Transportation Needs: No Transportation Needs (07/02/2024)   Received from [facility] - Transportation    In the past 12 months, has lack of transportation kept you from medical appointments or from getting medications?: No    Lack of Transportation (Non-Medical): No  Physical Activity: Insufficiently Active (09/21/2022)   Exercise Vital Sign    Days of Exercise per Week: 3 days    Minutes of Exercise per Session: 30 min  Stress: Not on file  Social Connections: Not on file  Intimate Partner Violence: Not on file    Vital Signs: Blood pressure (!) 114/59, pulse 83, temperature 98 F (36.7 C), resp. rate 16, height 5' 5 (1.651 m),  weight 187 lb (84.8 kg), SpO2 91%.  Examination: General Appearance: The patient is well-developed, well-nourished, and in no distress. Skin: Gross inspection of skin unremarkable. Head: normocephalic, no gross deformities. Eyes: no gross deformities noted. ENT: ears appear grossly normal no exudates. Neck: Supple. No thyromegaly. No LAD. Respiratory: no rhonchi noted. Cardiovascular: Normal S1 and S2 without murmur or rub. Extremities: No cyanosis. pulses are equal. Neurologic: Alert and oriented. No involuntary movements.  LABS: Recent Results (from the past 2160 hours)  UA/M w/rflx Culture, Routine     Status: None   Collection Time: 06/12/24  2:05 PM   Specimen: Urine   Urine  Result Value Ref Range   Specific Gravity, UA CANCELED     Comment: Please refer to the following specimen for additional lab results.  [id number]  Result canceled by the ancillary.    pH, UA CANCELED     Comment: Test not performed  Result canceled by the ancillary.    Protein,UA CANCELED     Comment: Test not performed  Result canceled by the ancillary.    Glucose, UA CANCELED     Comment: Test not performed  Result canceled by the ancillary.    Ketones, UA CANCELED     Comment: Test not performed  Result canceled by the ancillary.   Urine Microalbumin w/creat. ratio     Status: Abnormal   Collection Time: 06/12/24  2:05 PM  Result Value Ref Range   Creatinine, Urine 52.7 Not Estab. mg/dL   Microalbumin, Urine 69.7 Not Estab. ug/mL   Microalb/Creat Ratio 57 (H) 0 - 29 mg/g creat    Comment:                        Normal:                0 -  29                        Moderately increased: 30 - 300  Severely increased:       >300   UA/M w/rflx Culture, Routine     Status: Abnormal   Collection Time: 06/12/24  2:05 PM   Urine  Result Value Ref Range   Specific Gravity, UA 1.014 1.005 - 1.030   pH, UA 5.5 5.0 - 7.5   Color, UA Yellow Yellow    Appearance Ur Cloudy (A) Clear   Leukocytes,UA 3+ (A) Negative   Protein,UA Negative Negative/Trace   Glucose, UA 2+ (A) Negative   Ketones, UA Negative Negative   RBC, UA Negative Negative   Bilirubin, UA Negative Negative   Urobilinogen, Ur 0.2 0.2 - 1.0 mg/dL   Nitrite, UA Positive (A) Negative   Microscopic Examination See below:     Comment: Microscopic was indicated and was performed.   Urinalysis Reflex Comment     Comment: This specimen has reflexed to a Urine Culture.  Specimen status report     Status: None   Collection Time: 06/12/24  2:05 PM  Result Value Ref Range   specimen status report Comment     Comment: Written Authorization Written Authorization Written Authorization Received. Authorization received from ORIGINAL REQ 06-13-2024 Logged by Tandy Hanson   Microscopic Examination     Status: Abnormal   Collection Time: 06/12/24  2:05 PM   Urine  Result Value Ref Range   WBC, UA >30 (A) 0 - 5 /hpf   RBC, Urine 0-2 0 - 2 /hpf   Epithelial Cells (non renal) None seen 0 - 10 /hpf   Casts None seen None seen /lpf   Bacteria, UA Many (A) None seen/Few  Urine Culture, Reflex     Status: None   Collection Time: 06/12/24  2:05 PM   Urine  Result Value Ref Range   Urine Culture, Routine CANCELED     Comment: Test not performed. One specimen was submitted with requests for multiple tests. The requested testing requires a separate specimen for each test requested.  Result canceled by the ancillary.   CBC with Differential/Platelet     Status: Abnormal   Collection Time: 06/13/24 11:17 AM  Result Value Ref Range   WBC 10.1 4.0 - 10.5 K/uL   RBC 5.31 (H) 3.87 - 5.11 MIL/uL   Hemoglobin 15.4 (H) 12.0 - 15.0 g/dL   HCT 51.4 (H) 63.9 - 53.9 %   MCV 91.3 80.0 - 100.0 fL   MCH 29.0 26.0 - 34.0 pg   MCHC 31.8 30.0 - 36.0 g/dL   RDW 86.2 88.4 - 84.4 %   Platelets 253 150 - 400 K/uL   nRBC 0.0 0.0 - 0.2 %   Neutrophils Relative % 52 %   Neutro Abs 5.2 1.7 - 7.7  K/uL   Lymphocytes Relative 41 %   Lymphs Abs 4.2 (H) 0.7 - 4.0 K/uL   Monocytes Relative 6 %   Monocytes Absolute 0.7 0.1 - 1.0 K/uL   Eosinophils Relative 1 %   Eosinophils Absolute 0.1 0.0 - 0.5 K/uL   Basophils Relative 0 %   Basophils Absolute 0.0 0.0 - 0.1 K/uL   Immature Granulocytes 0 %   Abs Immature Granulocytes 0.02 0.00 - 0.07 K/uL    Comment: Performed at [facility], [address] [place], [place] [address]  Comprehensive metabolic panel     Status: Abnormal   Collection Time: 06/13/24 11:17 AM  Result Value Ref Range   Sodium 141 135 - 145 mmol/L   Potassium 4.2 3.5 - 5.1 mmol/L  Chloride 99 98 - 111 mmol/L   CO2 28 22 - 32 mmol/L   Glucose, Bld 118 (H) 70 - 99 mg/dL    Comment: Glucose reference range applies only to samples taken after fasting for at least 8 hours.   BUN 20 8 - 23 mg/dL   Creatinine, Ser 9.08 0.44 - 1.00 mg/dL   Calcium  9.9 8.9 - 10.3 mg/dL   Total Protein 7.4 6.5 - 8.1 g/dL   Albumin 4.2 3.5 - 5.0 g/dL   AST 22 15 - 41 U/L   ALT 20 0 - 44 U/L   Alkaline Phosphatase 53 38 - 126 U/L   Total Bilirubin 0.6 0.0 - 1.2 mg/dL   GFR, Estimated >39 >39 mL/min    Comment: (NOTE) Calculated using the CKD-EPI Creatinine Equation (2021)    Anion gap 14 5 - 15    Comment: Performed at [facility], [address] [place], [place] [address]  Vitamin B12     Status: None   Collection Time: 06/13/24 11:17 AM  Result Value Ref Range   Vitamin B-12 394 180 - 914 pg/mL    Comment: (NOTE) This assay is not validated for testing neonatal or myeloproliferative syndrome specimens for Vitamin B12 levels. Performed at [facility], [address], [place], [place] [address]   Folate     Status: None   Collection Time: 06/13/24 11:17 AM  Result Value Ref Range   Folate >20.0 >5.9 ng/mL    Comment: Performed at [facility], [address] [place], [place] [address]  TSH     Status: None   Collection Time: 06/13/24  11:17 AM  Result Value Ref Range   TSH 2.113 0.350 - 4.500 uIU/mL    Comment: Performed by a 3rd Generation assay with a functional sensitivity of <=0.01 uIU/mL. Performed at [facility], [address] [place], [place] [address]   T4, free     Status: None   Collection Time: 06/13/24 11:17 AM  Result Value Ref Range   Free T4 0.92 0.61 - 1.12 ng/dL    Comment: (NOTE) Biotin ingestion may interfere with free T4 tests. If the results are inconsistent with the TSH level, previous test results, or the clinical presentation, then consider biotin interference. If needed, order repeat testing after stopping biotin. Performed at [facility], [address] [place], [place] [address]   VITAMIN D  25 Hydroxy (Vit-D Deficiency, Fractures)     Status: Abnormal   Collection Time: 06/13/24 11:17 AM  Result Value Ref Range   Vit D, 25-Hydroxy 24.21 (L) 30 - 100 ng/mL    Comment: (NOTE) Vitamin D  deficiency has been defined by the Institute of Medicine  and an Endocrine Society practice guideline as a level of serum 25-OH  vitamin D  less than 20 ng/mL (1,2). The Endocrine Society went on to  further define vitamin D  insufficiency as a level between 21 and 29  ng/mL (2).  1. IOM (Institute of Medicine). 2010. Dietary reference intakes for  calcium  and D. [place]: [employer]. 2. Holick MF, Binkley [place], Bischoff-Ferrari HA, et al. Evaluation,  treatment, and prevention of vitamin D  deficiency: an Endocrine  Society clinical practice guideline, JCEM. 2011 Jul; 96(7): 1911-30.  Performed at [facility], [address] [place], [place] [address]   Hemoglobin A1c     Status: Abnormal   Collection Time: 06/13/24 11:17 AM  Result Value Ref Range   Hgb A1c  MFr Bld 6.4 (H) 4.8 - 5.6 %    Comment: (NOTE) Diagnosis of Diabetes The following HbA1c ranges recommended by the American Diabetes Association (ADA) may be used as an aid in the diagnosis  of diabetes mellitus.  Hemoglobin             Suggested A1C NGSP%              Diagnosis  <5.7                   Non Diabetic  5.7-6.4                Pre-Diabetic  >6.4                   Diabetic  <7.0                   Glycemic control for                       adults with diabetes.     Mean Plasma Glucose 136.98 mg/dL    Comment: Performed at [facility], [address], [place], [place] [address]  CULTURE, URINE COMPREHENSIVE     Status: Abnormal   Collection Time: 06/17/24  4:10 PM   Specimen: Urine   Urine  Result Value Ref Range   Urine Culture, Comprehensive Final report (A)    Organism ID, Bacteria Klebsiella pneumoniae (A)     Comment: Cefazolin  with an MIC <=16 predicts susceptibility to the oral agents cefaclor, cefdinir, cefpodoxime, cefprozil, cefuroxime, cephalexin, and loracarbef when used for therapy of uncomplicated urinary tract infections due to E. coli, Klebsiella pneumoniae, and Proteus mirabilis. Greater than 100,000 colony forming units per mL    ANTIMICROBIAL SUSCEPTIBILITY Comment     Comment:       ** S = Susceptible; I = Intermediate; R = Resistant **                    P = Positive; N = Negative             MICS are expressed in micrograms per mL    Antibiotic                 RSLT#1    RSLT#2    RSLT#3    RSLT#4 Amoxicillin /Clavulanic Acid    S Ampicillin                      R Cefazolin                       S Cefepime                       S Cefoxitin                      S Cefpodoxime                    S Ceftriaxone                    S Ciprofloxacin                   S Ertapenem                      S Gentamicin  S Levofloxacin                    S Meropenem                      S Nitrofurantoin                  S Piperacillin/Tazobactam        S Tetracycline                   S Tobramycin                     S Trimethoprim/Sulfa             S     Radiology: No results found.  No results found.  No  results found.  Assessment and Plan: Patient Active Problem List   Diagnosis Date Noted   Varicose veins of leg with swelling, bilateral 06/09/2022   Hypokalemia 09/12/2020   Chronic venous stasis 01/11/2020   Atopic dermatitis 01/11/2020   Diastolic dysfunction 09/30/2019   Peripheral edema 09/10/2019   Dyspnea on exertion 09/10/2019   Muscle cramps 09/10/2019   Type 2 diabetes mellitus with hyperglycemia (HCC) 05/18/2019   Seasonal allergic rhinitis 05/18/2019   Screening for breast cancer 05/18/2019   Stenosis of left carotid artery 02/05/2019   Acute upper respiratory infection 10/09/2018   Flu-like symptoms 10/09/2018   Sore throat 10/09/2018   Vitamin D  deficiency 07/27/2018   Need for vaccination against Streptococcus pneumoniae using pneumococcal conjugate vaccine 7 07/27/2018   Encounter for general adult medical examination with abnormal findings 04/07/2018   Primary generalized (osteo)arthritis 04/07/2018   Hematuria, microscopic 12/18/2017   Renal colic 12/18/2017   Urinary tract infection without hematuria 11/21/2017   Dysuria 11/21/2017   Uncontrolled type 2 diabetes mellitus with hyperglycemia (HCC) 11/21/2017   Diabetes mellitus type 2, uncomplicated (HCC) 10/04/2017   Osteoporosis 10/04/2017   S/P total hip arthroplasty 06/14/2016   Inguinal hernia, left 06/09/2013   Umbilical hernia 06/09/2013   Increased frequency of urination 11/27/2012   Chronic cystitis 11/19/2012   Incomplete emptying of bladder 11/19/2012   Medullary sponge kidney 11/19/2012   Mixed urge and stress incontinence 11/19/2012   Anxiety 01/02/2012   Calculus of kidney 01/02/2012   Chronic obstructive pulmonary disease (HCC) 01/02/2012   GERD (gastroesophageal reflux disease) 01/02/2012   Hearing loss 01/02/2012   Mixed hyperlipidemia 01/02/2012   Obesity, unspecified 01/02/2012   OSA on CPAP 01/02/2012   Tobacco abuse 01/02/2012   Venous insufficiency 01/02/2012    1. PAH  (pulmonary artery hypertension) (HCC) (Primary) We had a conversation regarding the obvious hear that she should not be really doing any high-altitude climbing.  She had wanted to go to [place]  but I did not know that she was going to be going up to [place].  This I believe set her back a little bit but she is slowly improving.  She also has not of note an increase in her hematocrit and this may very well be due to the hypoxia.  2. Oxygen  dependent She can will continue with her oxygen  therapy she is on 2 to 3 L I suggested she go to 4 L  3. Obstructive chronic bronchitis without exacerbation (HCC) Guarded prognosis will continue with supportive care  4. Chronic hypoxemic respiratory failure (HCC) She is on oxygen  she is having secondary polycythemia hematocrit was around the 15 range.  5. Secondary polycythemia As  above recommended referral to hematology to have her plugged into the system I do not think she needs to have any blood taken at this time but we will have her assessed - Ambulatory referral to Hematology / Oncology   General Counseling: I have discussed the findings of the evaluation and examination with Kabao.  I have also discussed any further diagnostic evaluation thatmay be needed or ordered today. Syvanna verbalizes understanding of the findings of todays visit. We also reviewed her medications today and discussed drug interactions and side effects including but not limited excessive drowsiness and altered mental states. We also discussed that there is always a risk not just to her but also people around her. she has been encouraged to call the office with any questions or concerns that should arise related to todays visit.  No orders of the defined types were placed in this encounter.    Time spent: 3  I have personally obtained a history, examined the patient, evaluated laboratory and imaging results, formulated the assessment and plan and placed orders.    Elfreda DELENA Bathe, MD [facility] Pulmonary and Critical Care Sleep medicine

## 2024-07-07 NOTE — Patient Instructions (Signed)
Pulmonary Hypertension Pulmonary hypertension is a condition that causes high blood pressure in the blood vessels of your lungs. This makes your heart work extra hard to pump blood to your lungs. And when your heart has to work harder, it can be harder for you to breathe. Over time, this can hurt and weaken your heart. What are the causes? This condition can be put into one of five groups based on what causes it. Group 1 happens when the blood vessels that carry blood to your lungs get too thick or stiff. This may happen with no known cause or it may be: Inherited. This means it's passed from parent to child. Caused by another disease, such as a disease of the heart or liver. Caused by some medicines or poisons. Group 2 happens if you have problems with the valves in your heart or if the left side of your heart, also called your left ventricle, gets weak. Group 3 can be caused by long-term diseases of the lungs, such as chronic obstructive pulmonary disease or COPD. This can also happen if you don't get enough oxygen, such as if you have trouble breathing when you sleep or if you live at a high altitude. Group 4 is caused by blood clots in your lungs. Group 5 includes other causes, such as sickle cell anemia or growths of cells that aren't normal called tumors. What are the signs or symptoms? Symptoms may include: Shortness of breath. A cough. Tiredness. Feeling dizzy or light-headed. You may also faint. A fast heart beat. You may also feel your heart flutter or skip a beat. Your lips or fingers turning blue. Chest pain or tightness. How is this diagnosed? This condition may be diagnosed with: Blood tests. Imaging tests. These may include: Chest X-rays. CT scans. An echocardiogram. This is an ultrasound of your heart. A ventilation-perfusion scan. This sees how well air and blood flow in and out of your lungs. A pulmonary function test. This looks at how much air your lungs can  hold. A 6-minute walk test. This may be done to check your breathing during exercise. Cardiac catheterization. This is a procedure that uses a soft tube called a catheter to check the arteries of your heart. A lung biopsy. This is when a small piece of tissue is removed from your lung for testing. How is this treated? There's no cure for this condition. But treatment can help you feel better and can slow the condition down. You may need: Oxygen therapy. Cardiac rehabilitation, or rehab. This is a program that teaches you how to: Care for your heart. Exercise. Go back to your normal activities. Medicines to: Lower your blood pressure. Relax the blood vessels in your lungs. Help your heart pump more blood. Help your body get rid of extra fluid. Thin your blood. This can stop you from getting blood clots. Lung surgery. This can relieve pressure on your heart. You may need this if other treatments don't work. Heart-lung or lung transplant. This may be done in very severe cases. Follow these instructions at home: Eating and drinking  Eat a healthy diet. Eat lots of fresh fruits and vegetables, whole grains, and beans. Limit how much salt you take in to less than 2,300 mg a day. Salt is also called sodium. Activity Get lots of rest. Exercise as told. Ask your health care provider what types of exercise are safe for you. Lifestyle Do not use any products that contain nicotine or tobacco. These products include cigarettes, chewing  tobacco, and vaping devices, such as e-cigarettes. If you need help quitting, ask your provider. Stay away when other people smoke. Do not sit in hot tubs or saunas for long stretches of time. Do not get pregnant. If needed, talk with your provider about birth control. Avoid high altitudes. It can be stressful to live with pulmonary hypertension. Talk with your provider about finding support groups and online help. General instructions Take over-the-counter and  prescription medicines only as told by your provider. Do not change or stop medicines without talking with your provider. Stay up to date on your shots, such as your yearly flu shot and pneumonia shot. Use oxygen therapy at home as told. Keep all follow-up visits. Your provider will check your breathing and make changes to your medicines as needed. Contact a health care provider if: Your cough gets worse. You have more shortness of breath. You start to have trouble doing things you could do before. You need to use more medicines or oxygen, or you need to use them more often than normal. Get help right away if: You have severe shortness of breath. You have chest pain or pressure. You cough up blood. These symptoms may be an emergency. Get help right away. Call 911. Do not wait to see if the symptoms will go away. Do not drive yourself to the hospital. This information is not intended to replace advice given to you by your health care provider. Make sure you discuss any questions you have with your health care provider. Document Revised: 10/30/2022 Document Reviewed: 10/30/2022 Elsevier Patient Education  2024 [employer].

## 2024-07-08 ENCOUNTER — Inpatient Hospital Stay

## 2024-07-08 ENCOUNTER — Encounter: Payer: Self-pay | Admitting: Oncology

## 2024-07-08 ENCOUNTER — Inpatient Hospital Stay: Attending: Oncology | Admitting: Oncology

## 2024-07-08 VITALS — BP 116/61 | HR 62 | Temp 97.8°F | Resp 18 | Ht 65.0 in | Wt 188.0 lb

## 2024-07-08 DIAGNOSIS — Z87891 Personal history of nicotine dependence: Secondary | ICD-10-CM | POA: Diagnosis not present

## 2024-07-08 DIAGNOSIS — R718 Other abnormality of red blood cells: Secondary | ICD-10-CM

## 2024-07-08 DIAGNOSIS — Z79899 Other long term (current) drug therapy: Secondary | ICD-10-CM | POA: Insufficient documentation

## 2024-07-08 DIAGNOSIS — Z803 Family history of malignant neoplasm of breast: Secondary | ICD-10-CM | POA: Insufficient documentation

## 2024-07-08 DIAGNOSIS — E1165 Type 2 diabetes mellitus with hyperglycemia: Secondary | ICD-10-CM | POA: Diagnosis not present

## 2024-07-08 DIAGNOSIS — D751 Secondary polycythemia: Secondary | ICD-10-CM

## 2024-07-08 LAB — CBC (CANCER CENTER ONLY)
HCT: 47 % — ABNORMAL HIGH (ref 36.0–46.0)
Hemoglobin: 15.3 g/dL — ABNORMAL HIGH (ref 12.0–15.0)
MCH: 29.3 pg (ref 26.0–34.0)
MCHC: 32.6 g/dL (ref 30.0–36.0)
MCV: 90 fL (ref 80.0–100.0)
Platelet Count: 230 K/uL (ref 150–400)
RBC: 5.22 MIL/uL — ABNORMAL HIGH (ref 3.87–5.11)
RDW: 13.7 % (ref 11.5–15.5)
WBC Count: 8.7 K/uL (ref 4.0–10.5)
nRBC: 0 % (ref 0.0–0.2)

## 2024-07-08 LAB — IRON AND TIBC
Iron: 82 ug/dL (ref 28–170)
Saturation Ratios: 20 % (ref 10.4–31.8)
TIBC: 402 ug/dL (ref 250–450)
UIBC: 320 ug/dL

## 2024-07-08 LAB — FERRITIN: Ferritin: 52 ng/mL (ref 11–307)

## 2024-07-08 NOTE — Progress Notes (Signed)
 [facility]  Telephone:(336) [phone] [phone]) [phone]  ID: Ike Rattler OB: 25-Jul-1946  MR#: [id number]  RDW#:[id number]  Patient Care Team: Kristina Tinnie MARLA DEVONNA as PCP - General (Physician Assistant) Darliss Rogue, MD as PCP - Cardiology (Cardiology)  CHIEF COMPLAINT: Polycythemia.  INTERVAL HISTORY: Patient is a 78 year old female who was noted to have a mildly increased hemoglobin on routine blood work.  She is referred for further evaluation.  She currently feels well and is asymptomatic.  She has no neurologic complaints.  She denies any recent fevers or illnesses.  She has a good appetite and denies weight loss.  She has no chest pain, shortness of breath, cough, or hemoptysis.  She denies any nausea, vomiting, constipation, or diarrhea.  She has no urinary complaints.  Patient feels at her baseline and offers no specific complaints today.  REVIEW OF SYSTEMS:   Review of Systems  Constitutional: Negative.  Negative for fever, malaise/fatigue and weight loss.  Respiratory: Negative.  Negative for cough, hemoptysis and shortness of breath.   Cardiovascular: Negative.  Negative for chest pain and leg swelling.  Gastrointestinal: Negative.  Negative for abdominal pain.  Genitourinary: Negative.  Negative for dysuria.  Musculoskeletal: Negative.  Negative for back pain.  Skin: Negative.  Negative for rash.  Neurological: Negative.  Negative for dizziness, focal weakness, weakness and headaches.  Psychiatric/Behavioral: Negative.  The patient is not nervous/anxious.     As per HPI. Otherwise, a complete review of systems is negative.  PAST MEDICAL HISTORY: Past Medical History:  Diagnosis Date   Anxiety 01/02/2012   Arthritis    Asthma    Blockage of coronary artery of heart (HCC)    Chronic kidney disease    Kidney stones   Colon polyps    COPD (chronic obstructive pulmonary disease) (HCC)    Diabetes mellitus without complication (HCC)     GERD (gastroesophageal reflux disease)    HOH (hard of hearing)    Bilateral hearing aids   Kidney stone    Osteoporosis    Sleep apnea    Does not use C-PAP on a regular basis   Urinary tract infection    Venous stasis     PAST SURGICAL HISTORY: Past Surgical History:  Procedure Laterality Date   APPENDECTOMY     cataract surgery Bilateral    CHOLECYSTECTOMY     COLONOSCOPY     COLONOSCOPY WITH PROPOFOL  N/A 05/28/2015   Procedure: COLONOSCOPY WITH PROPOFOL ;  Surgeon: Gladis RAYMOND Mariner, MD;  Location: [facility] ENDOSCOPY;  Service: Endoscopy;  Laterality: N/A;   COLONOSCOPY WITH PROPOFOL  N/A 10/28/2018   Procedure: COLONOSCOPY WITH PROPOFOL ;  Surgeon: Mariner Gladis RAYMOND, MD;  Location: [facility] ENDOSCOPY;  Service: Endoscopy;  Laterality: N/A;   EYE SURGERY Bilateral 2015   Cataract Extraction with IOL   LITHOTRIPSY  2015   has had many stones   Mastoidotomy  1970   ROTATOR CUFF REPAIR Right 2011   TOTAL HIP ARTHROPLASTY Right 06/14/2016   Procedure: TOTAL HIP ARTHROPLASTY;  Surgeon: Lynwood SHAUNNA Hue, MD;  Location: [facility] ORS;  Service: Orthopedics;  Laterality: Right;    FAMILY HISTORY: Family History  Problem Relation Age of Onset   Breast cancer Maternal Aunt 50   Breast cancer Maternal Aunt    Bladder Cancer Neg Hx    Kidney cancer Neg Hx     ADVANCED DIRECTIVES (Y/N):  N  HEALTH MAINTENANCE: Social History   Tobacco Use   Smoking status: Former    Current packs/day: 0.00  Average packs/day: 1 pack/day for 50.0 years (50.0 ttl pk-yrs)    Types: Cigarettes    Start date: 12/29/1971    Quit date: 12/28/2021    Years since quitting: 2.5    Passive exposure: Never   Smokeless tobacco: Never  Vaping Use   Vaping status: Never Used  Substance Use Topics   Alcohol  use: Not Currently    Comment: very rarely    Drug use: No     Colonoscopy:  PAP:  Bone density:  Lipid panel:  Allergies  Allergen Reactions   Iodinated Contrast Media Hives and Itching    Pt received  13hr pre-meds for a Cardiac scan on 11/16/21 and had itching and Hives develop after contrast administration   Shellfish Allergy  Swelling    shrimp Betadine okay   Sulfa Antibiotics Hives    Difficulty breathing   Sulfasalazine Hives    Difficulty breathing   Percocet [Oxycodone-Acetaminophen ] Hives    Difficulty breathing   Iodine Itching    Current Outpatient Medications  Medication Sig Dispense Refill   acetaminophen  (TYLENOL ) 500 MG chewable tablet Chew 1,000 mg by mouth every 8 (eight) hours as needed for pain.     albuterol  (VENTOLIN  HFA) 108 (90 Base) MCG/ACT inhaler Inhale 2 puffs into the lungs every 6 (six) hours as needed for wheezing or shortness of breath. 18 g 2   aspirin  EC 81 MG tablet Take 1 tablet (81 mg total) by mouth daily. Swallow whole. 90 tablet 3   cholecalciferol  (VITAMIN D ) 400 UNITS TABS tablet Take 2,000 Units by mouth daily.     CRANBERRY PO Take 25,000 mg by mouth daily.     diclofenac  Sodium (VOLTAREN ) 1 % GEL Apply small amount to area of pain daily as needed 2 g 0   ergocalciferol  (DRISDOL ) 1.25 MG (50000 UT) capsule Take one cap q week 12 capsule 3   estradiol  (ESTRACE ) 0.01 % CREA vaginal cream ESTROGEN CREAM INSTRUCTION DISCARD APPLICATOR APPLY PEA SIZED AMOUNT TO TIP OF FINGER TO URETHRA BEFORE BED. WASH HANDS WELL AFTER APPLICATION. USE MONDAY, WEDNESDAY AND FRIDAY 42.5 g 12   ezetimibe  (ZETIA ) 10 MG tablet Take 1 tablet (10 mg total) by mouth daily. 90 tablet 3   FARXIGA  10 MG TABS tablet TAKE 1 TABLET BY MOUTH DAILY BEFORE BREAKFAST. 90 tablet 3   fluconazole  (DIFLUCAN ) 150 MG tablet Take one tab po q week for fungal infection and as needed 12 tablet 1   Fluticasone -Umeclidin-Vilant (TRELEGY ELLIPTA ) 100-62.5-25 MCG/ACT AEPB Use inhale  orally daily 3 each 4   glucose blood (ONETOUCH VERIO) test strip BLOOD SUGAR TESTING ONCE DAILY . DX E11.65 100 strip 3   hydrocortisone  cream 0.5 % Apply topically as needed. 30 g 0   ibandronate  (BONIVA ) 150 MG  tablet TAKE 1 TABLET (150 MG TOTAL) BY MOUTH EVERY 30 (THIRTY) DAYS. 3 tablet 3   ketoconazole (NIZORAL) 2 % cream Apply 1 Application topically daily.     Lancets (ONETOUCH DELICA PLUS LANCET30G) MISC Use  as directed twice a daily DX E11.65 100 each 3   macitentan  (OPSUMIT ) 10 MG tablet Take 1 tablet (10 mg total) by mouth daily. 90 tablet 5   Magnesium  250 MG TABS Take by mouth. Takes 1 tablet 2-3 times per week     montelukast  (SINGULAIR ) 10 MG tablet Take 1 tablet (10 mg total) by mouth daily as needed. 90 tablet 1   Multiple Vitamins-Minerals (MULTIVITAMIN ADULTS PO) Take 1 tablet by mouth daily.  nitrofurantoin , macrocrystal-monohydrate, (MACROBID ) 100 MG capsule Take 1 cap twice per day for 7 days. 14 capsule 0   omeprazole  (PRILOSEC) 40 MG capsule TAKE 1 CAPSULE (40 MG TOTAL) BY MOUTH DAILY. 90 capsule 3   OXYGEN  Inhale 3 L into the lungs. Pt uses [facility] for Oxygen      potassium chloride  SA (KLOR-CON  M) 20 MEQ tablet Take 2 tablets (40 mEq total) by mouth daily. 180 tablet 3   spironolactone  (ALDACTONE ) 25 MG tablet Take 1 tablet (25 mg total) by mouth daily. 90 tablet 3   torsemide  (DEMADEX ) 20 MG tablet TAKE 2 TABLETS (40MG ) BY MOUTH DAILY 180 tablet 3   No current facility-administered medications for this visit.    OBJECTIVE: Vitals:   07/08/24 1130  BP: 116/61  Pulse: 62  Resp: 18  Temp: 97.8 F (36.6 C)  SpO2: 93%     Body mass index is 31.28 kg/m.    ECOG FS:0 - Asymptomatic  General: Well-developed, well-nourished, no acute distress. Eyes: Pink conjunctiva, anicteric sclera. HEENT: Normocephalic, moist mucous membranes. Lungs: No audible wheezing or coughing. Heart: Regular rate and rhythm. Abdomen: Soft, nontender, no obvious distention. Musculoskeletal: No edema, cyanosis, or clubbing. Neuro: Alert, answering all questions appropriately. Cranial nerves grossly intact. Skin: No rashes or petechiae noted. Psych: Normal affect. Lymphatics: No  cervical, calvicular, axillary or inguinal LAD.   LAB RESULTS:  Lab Results  Component Value Date   NA 141 06/13/2024   K 4.2 06/13/2024   CL 99 06/13/2024   CO2 28 06/13/2024   GLUCOSE 118 (H) 06/13/2024   BUN 20 06/13/2024   CREATININE 0.91 06/13/2024   CALCIUM  9.9 06/13/2024   PROT 7.4 06/13/2024   ALBUMIN 4.2 06/13/2024   AST 22 06/13/2024   ALT 20 06/13/2024   ALKPHOS 53 06/13/2024   BILITOT 0.6 06/13/2024   GFRNONAA >60 06/13/2024   GFRAA 104 09/11/2019    Lab Results  Component Value Date   WBC 8.7 07/08/2024   NEUTROABS 5.2 06/13/2024   HGB 15.3 (H) 07/08/2024   HCT 47.0 (H) 07/08/2024   MCV 90.0 07/08/2024   PLT 230 07/08/2024   Lab Results  Component Value Date   IRON 82 07/08/2024   TIBC 402 07/08/2024   IRONPCTSAT 20 07/08/2024   Lab Results  Component Value Date   FERRITIN 52 07/08/2024     STUDIES: No results found.  ASSESSMENT: Polycythemia.  PLAN:    Polycythemia: Patient's hemoglobin is only mildly elevated at 15.3.  Iron stores, B12, and folate level are all within normal limits.  Erythropoietin level, carbon monoxide, and JAK2 with reflex are all pending at time of dictation.  No intervention is needed.  Patient does not require phlebotomy.  Return to clinic in 3 to 4 weeks for further evaluation and discussion of her results.  I spent a total of 45 minutes reviewing chart data, face-to-face evaluation with the patient, counseling and coordination of care as detailed above.   Patient expressed understanding and was in agreement with this plan. She also understands that She can call clinic at any time with any questions, concerns, or complaints.    Evalene JINNY Reusing, MD   07/08/2024 4:55 PM

## 2024-07-08 NOTE — Progress Notes (Signed)
 Patient is hearing impaired so you have to face her and speak clearly and loudly. She is doing ok, just a little nervous.

## 2024-07-09 LAB — ERYTHROPOIETIN: Erythropoietin: 5.5 m[IU]/mL (ref 2.6–18.5)

## 2024-07-10 LAB — CARBON MONOXIDE, BLOOD (PERFORMED AT REF LAB): Carbon Monoxide, Blood: 3.4 % (ref 0.0–3.6)

## 2024-07-13 ENCOUNTER — Other Ambulatory Visit: Payer: Self-pay | Admitting: Internal Medicine

## 2024-07-13 DIAGNOSIS — M810 Age-related osteoporosis without current pathological fracture: Secondary | ICD-10-CM

## 2024-07-16 ENCOUNTER — Other Ambulatory Visit: Payer: Self-pay

## 2024-07-16 DIAGNOSIS — F320 Major depressive disorder, single episode, mild: Secondary | ICD-10-CM

## 2024-07-16 DIAGNOSIS — N390 Urinary tract infection, site not specified: Secondary | ICD-10-CM

## 2024-07-16 DIAGNOSIS — J418 Mixed simple and mucopurulent chronic bronchitis: Secondary | ICD-10-CM

## 2024-07-16 DIAGNOSIS — E1165 Type 2 diabetes mellitus with hyperglycemia: Secondary | ICD-10-CM

## 2024-07-16 DIAGNOSIS — J9611 Chronic respiratory failure with hypoxia: Secondary | ICD-10-CM

## 2024-07-16 LAB — CALR +MPL + E12-E15  (REFLEX)

## 2024-07-16 LAB — JAK2 V617F RFX CALR/MPL/E12-15

## 2024-07-16 MED ORDER — TRELEGY ELLIPTA 100-62.5-25 MCG/ACT IN AEPB: INHALATION_SPRAY | RESPIRATORY_TRACT | 4 refills | Status: AC

## 2024-07-23 DIAGNOSIS — D164 Benign neoplasm of bones of skull and face: Secondary | ICD-10-CM | POA: Diagnosis not present

## 2024-07-23 DIAGNOSIS — H903 Sensorineural hearing loss, bilateral: Secondary | ICD-10-CM | POA: Diagnosis not present

## 2024-07-23 DIAGNOSIS — H6531 Chronic mucoid otitis media, right ear: Secondary | ICD-10-CM | POA: Diagnosis not present

## 2024-07-29 ENCOUNTER — Inpatient Hospital Stay

## 2024-07-29 ENCOUNTER — Encounter: Payer: Self-pay | Admitting: Oncology

## 2024-07-29 ENCOUNTER — Inpatient Hospital Stay: Attending: Oncology | Admitting: Oncology

## 2024-07-29 VITALS — BP 138/80 | HR 66 | Temp 97.0°F | Resp 16 | Ht 65.0 in | Wt 191.0 lb

## 2024-07-29 DIAGNOSIS — D751 Secondary polycythemia: Secondary | ICD-10-CM | POA: Insufficient documentation

## 2024-07-29 NOTE — Progress Notes (Unsigned)
 [facility]  Telephone:(336) [phone] Fax:(336) [phone]  ID: Amber Holder OB: 01-30-1946  MR#: [id number]  RDW#:[id number]  Patient Care Team: Kristina Tinnie MARLA DEVONNA as PCP - General (Physician Assistant) Darliss Rogue, MD as PCP - Cardiology (Cardiology)  CHIEF COMPLAINT: Polycythemia.  INTERVAL HISTORY: Patient returns to clinic today for further evaluation and discussion of her laboratory results.  She continues to feel well and remains asymptomatic.  She does not complain of any weakness or fatigue.  She has no neurologic complaints.  She denies any recent fevers or illnesses.  She has a good appetite and denies weight loss.  She has no chest pain, shortness of breath, cough, or hemoptysis.  She denies any nausea, vomiting, constipation, or diarrhea.  She has no urinary complaints.  Patient offers no specific complaints today.  REVIEW OF SYSTEMS:   Review of Systems  Constitutional: Negative.  Negative for fever, malaise/fatigue and weight loss.  Respiratory: Negative.  Negative for cough, hemoptysis and shortness of breath.   Cardiovascular: Negative.  Negative for chest pain and leg swelling.  Gastrointestinal: Negative.  Negative for abdominal pain.  Genitourinary: Negative.  Negative for dysuria.  Musculoskeletal: Negative.  Negative for back pain.  Skin: Negative.  Negative for rash.  Neurological: Negative.  Negative for dizziness, focal weakness, weakness and headaches.  Psychiatric/Behavioral: Negative.  The patient is not nervous/anxious.     As per HPI. Otherwise, a complete review of systems is negative.  PAST MEDICAL HISTORY: Past Medical History:  Diagnosis Date   Anxiety 01/02/2012   Arthritis    Asthma    Blockage of coronary artery of heart (HCC)    Chronic kidney disease    Kidney stones   Colon polyps    COPD (chronic obstructive pulmonary disease) (HCC)    Diabetes mellitus without complication (HCC)    GERD  (gastroesophageal reflux disease)    HOH (hard of hearing)    Bilateral hearing aids   Kidney stone    Osteoporosis    Sleep apnea    Does not use C-PAP on a regular basis   Urinary tract infection    Venous stasis     PAST SURGICAL HISTORY: Past Surgical History:  Procedure Laterality Date   APPENDECTOMY     cataract surgery Bilateral    CHOLECYSTECTOMY     COLONOSCOPY     COLONOSCOPY WITH PROPOFOL  N/A 05/28/2015   Procedure: COLONOSCOPY WITH PROPOFOL ;  Surgeon: Gladis RAYMOND Mariner, MD;  Location: [facility] ENDOSCOPY;  Service: Endoscopy;  Laterality: N/A;   COLONOSCOPY WITH PROPOFOL  N/A 10/28/2018   Procedure: COLONOSCOPY WITH PROPOFOL ;  Surgeon: Mariner Gladis RAYMOND, MD;  Location: [facility] ENDOSCOPY;  Service: Endoscopy;  Laterality: N/A;   EYE SURGERY Bilateral 2015   Cataract Extraction with IOL   LITHOTRIPSY  2015   has had many stones   Mastoidotomy  1970   ROTATOR CUFF REPAIR Right 2011   TOTAL HIP ARTHROPLASTY Right 06/14/2016   Procedure: TOTAL HIP ARTHROPLASTY;  Surgeon: Lynwood SHAUNNA Hue, MD;  Location: [facility] ORS;  Service: Orthopedics;  Laterality: Right;    FAMILY HISTORY: Family History  Problem Relation Age of Onset   Breast cancer Maternal Aunt 50   Breast cancer Maternal Aunt    Bladder Cancer Neg Hx    Kidney cancer Neg Hx     ADVANCED DIRECTIVES (Y/N):  N  HEALTH MAINTENANCE: Social History   Tobacco Use   Smoking status: Former    Current packs/day: 0.00    Average packs/day: 1  pack/day for 50.0 years (50.0 ttl pk-yrs)    Types: Cigarettes    Start date: 12/29/1971    Quit date: 12/28/2021    Years since quitting: 2.5    Passive exposure: Never   Smokeless tobacco: Never  Vaping Use   Vaping status: Never Used  Substance Use Topics   Alcohol  use: Not Currently    Comment: very rarely    Drug use: No     Colonoscopy:  PAP:  Bone density:  Lipid panel:  Allergies  Allergen Reactions   Iodinated Contrast Media Hives and Itching    Pt received 13hr  pre-meds for a Cardiac scan on 11/16/21 and had itching and Hives develop after contrast administration   Shellfish Allergy  Swelling    shrimp Betadine okay   Sulfa Antibiotics Hives    Difficulty breathing   Sulfasalazine Hives    Difficulty breathing   Percocet [Oxycodone-Acetaminophen ] Hives    Difficulty breathing   Iodine Itching    Current Outpatient Medications  Medication Sig Dispense Refill   acetaminophen  (TYLENOL ) 500 MG chewable tablet Chew 1,000 mg by mouth every 8 (eight) hours as needed for pain.     albuterol  (VENTOLIN  HFA) 108 (90 Base) MCG/ACT inhaler Inhale 2 puffs into the lungs every 6 (six) hours as needed for wheezing or shortness of breath. 18 g 2   aspirin  EC 81 MG tablet Take 1 tablet (81 mg total) by mouth daily. Swallow whole. 90 tablet 3   cholecalciferol  (VITAMIN D ) 400 UNITS TABS tablet Take 2,000 Units by mouth daily.     CRANBERRY PO Take 25,000 mg by mouth daily.     diclofenac  Sodium (VOLTAREN ) 1 % GEL Apply small amount to area of pain daily as needed 2 g 0   ergocalciferol  (DRISDOL ) 1.25 MG (50000 UT) capsule Take one cap q week 12 capsule 3   estradiol  (ESTRACE ) 0.01 % CREA vaginal cream ESTROGEN CREAM INSTRUCTION DISCARD APPLICATOR APPLY PEA SIZED AMOUNT TO TIP OF FINGER TO URETHRA BEFORE BED. WASH HANDS WELL AFTER APPLICATION. USE MONDAY, WEDNESDAY AND FRIDAY 42.5 g 12   ezetimibe  (ZETIA ) 10 MG tablet Take 1 tablet (10 mg total) by mouth daily. 90 tablet 3   FARXIGA  10 MG TABS tablet TAKE 1 TABLET BY MOUTH DAILY BEFORE BREAKFAST. 90 tablet 3   fluconazole  (DIFLUCAN ) 150 MG tablet Take one tab po q week for fungal infection and as needed 12 tablet 1   Fluticasone -Umeclidin-Vilant (TRELEGY ELLIPTA ) 100-62.5-25 MCG/ACT AEPB Use inhale  orally daily 3 each 4   glucose blood (ONETOUCH VERIO) test strip BLOOD SUGAR TESTING ONCE DAILY . DX E11.65 100 strip 3   hydrocortisone  cream 0.5 % Apply topically as needed. 30 g 0   ibandronate  (BONIVA ) 150 MG  tablet TAKE 1 TABLET (150 MG TOTAL) BY MOUTH EVERY 30 (THIRTY) DAYS. 3 tablet 3   ketoconazole (NIZORAL) 2 % cream Apply 1 Application topically daily.     Lancets (ONETOUCH DELICA PLUS LANCET30G) MISC Use  as directed twice a daily DX E11.65 100 each 3   macitentan  (OPSUMIT ) 10 MG tablet Take 1 tablet (10 mg total) by mouth daily. 90 tablet 5   Magnesium  250 MG TABS Take by mouth. Takes 1 tablet 2-3 times per week     montelukast  (SINGULAIR ) 10 MG tablet Take 1 tablet (10 mg total) by mouth daily as needed. 90 tablet 1   Multiple Vitamins-Minerals (MULTIVITAMIN ADULTS PO) Take 1 tablet by mouth daily.     nitrofurantoin , macrocrystal-monohydrate, (MACROBID )  100 MG capsule Take 1 cap twice per day for 7 days. 14 capsule 0   omeprazole  (PRILOSEC) 40 MG capsule TAKE 1 CAPSULE (40 MG TOTAL) BY MOUTH DAILY. 90 capsule 3   OXYGEN  Inhale 3 L into the lungs. Pt uses [facility] for Oxygen      potassium chloride  SA (KLOR-CON  M) 20 MEQ tablet Take 2 tablets (40 mEq total) by mouth daily. 180 tablet 3   spironolactone  (ALDACTONE ) 25 MG tablet Take 1 tablet (25 mg total) by mouth daily. 90 tablet 3   torsemide  (DEMADEX ) 20 MG tablet TAKE 2 TABLETS (40MG ) BY MOUTH DAILY 180 tablet 3   No current facility-administered medications for this visit.    OBJECTIVE: Vitals:   07/29/24 1010  BP: 138/80  Pulse: 66  Resp: 16  Temp: (!) 97 F (36.1 C)  SpO2: 90%     Body mass index is 31.78 kg/m.    ECOG FS:0 - Asymptomatic  General: Well-developed, well-nourished, no acute distress. Eyes: Pink conjunctiva, anicteric sclera. HEENT: Normocephalic, moist mucous membranes. Lungs: No audible wheezing or coughing. Heart: Regular rate and rhythm. Abdomen: Soft, nontender, no obvious distention. Musculoskeletal: No edema, cyanosis, or clubbing. Neuro: Alert, answering all questions appropriately. Cranial nerves grossly intact. Skin: No rashes or petechiae noted. Psych: Normal affect.  LAB  RESULTS:  Lab Results  Component Value Date   NA 141 06/13/2024   K 4.2 06/13/2024   CL 99 06/13/2024   CO2 28 06/13/2024   GLUCOSE 118 (H) 06/13/2024   BUN 20 06/13/2024   CREATININE 0.91 06/13/2024   CALCIUM  9.9 06/13/2024   PROT 7.4 06/13/2024   ALBUMIN 4.2 06/13/2024   AST 22 06/13/2024   ALT 20 06/13/2024   ALKPHOS 53 06/13/2024   BILITOT 0.6 06/13/2024   GFRNONAA >60 06/13/2024   GFRAA 104 09/11/2019    Lab Results  Component Value Date   WBC 8.7 07/08/2024   NEUTROABS 5.2 06/13/2024   HGB 15.3 (H) 07/08/2024   HCT 47.0 (H) 07/08/2024   MCV 90.0 07/08/2024   PLT 230 07/08/2024   Lab Results  Component Value Date   IRON 82 07/08/2024   TIBC 402 07/08/2024   IRONPCTSAT 20 07/08/2024   Lab Results  Component Value Date   FERRITIN 52 07/08/2024     STUDIES: No results found.  ASSESSMENT: Polycythemia.  PLAN:    Polycythemia: Patient's hemoglobin is only mildly elevated at 15.3.  All of her laboratory work including iron stores, B12, folate, erythropoietin  level, carbon monoxide, and JAK2 with reflex are all either negative or within normal limits.  No intervention is needed.  Patient does not require phlebotomy or a bone marrow biopsy.  No further intervention is needed.  No follow-up has been scheduled.  Please refer patient back if her hemoglobin begins to trend up and approaches 18.0.  I spent a total of 20 minutes reviewing chart data, face-to-face evaluation with the patient, counseling and coordination of care as detailed above.  Patient expressed understanding and was in agreement with this plan. She also understands that She can call clinic at any time with any questions, concerns, or complaints.    Amber JINNY Reusing, MD   07/29/2024 10:19 AM

## 2024-08-01 ENCOUNTER — Other Ambulatory Visit: Payer: Self-pay | Admitting: Physician Assistant

## 2024-08-01 DIAGNOSIS — E559 Vitamin D deficiency, unspecified: Secondary | ICD-10-CM

## 2024-08-14 ENCOUNTER — Other Ambulatory Visit: Payer: Self-pay | Admitting: Cardiology

## 2024-08-26 ENCOUNTER — Other Ambulatory Visit
Admission: RE | Admit: 2024-08-26 | Discharge: 2024-08-26 | Disposition: A | Discharge status: Home / Self Care | Source: Ambulatory Visit | Attending: Cardiology | Admitting: Cardiology

## 2024-08-26 DIAGNOSIS — E7800 Pure hypercholesterolemia, unspecified: Secondary | ICD-10-CM | POA: Insufficient documentation

## 2024-08-26 LAB — LIPID PANEL
Cholesterol: 223 mg/dL — ABNORMAL HIGH (ref 0–200)
HDL: 60 mg/dL
LDL Cholesterol: 136 mg/dL — ABNORMAL HIGH (ref 0–99)
Total CHOL/HDL Ratio: 3.7 ratio
Triglycerides: 134 mg/dL
VLDL: 27 mg/dL (ref 0–40)

## 2024-09-08 ENCOUNTER — Ambulatory Visit: Admitting: Cardiology

## 2024-09-08 ENCOUNTER — Other Ambulatory Visit: Payer: Self-pay

## 2024-09-08 ENCOUNTER — Encounter: Payer: Self-pay | Admitting: Cardiology

## 2024-09-08 ENCOUNTER — Ambulatory Visit: Payer: Self-pay | Admitting: Cardiology

## 2024-09-08 VITALS — BP 100/56 | HR 65 | Ht 66.0 in | Wt 186.6 lb

## 2024-09-08 DIAGNOSIS — I2510 Atherosclerotic heart disease of native coronary artery without angina pectoris: Secondary | ICD-10-CM | POA: Insufficient documentation

## 2024-09-08 DIAGNOSIS — E7800 Pure hypercholesterolemia, unspecified: Secondary | ICD-10-CM | POA: Diagnosis not present

## 2024-09-08 DIAGNOSIS — R600 Localized edema: Secondary | ICD-10-CM | POA: Insufficient documentation

## 2024-09-08 MED ORDER — BEMPEDOIC ACID 180 MG PO TABS: 180.0000 mg | ORAL_TABLET | Freq: Every day | ORAL | 3 refills | Status: AC

## 2024-09-08 MED ORDER — EZETIMIBE 10 MG PO TABS: 10.0000 mg | ORAL_TABLET | Freq: Every day | ORAL | 3 refills | Status: AC

## 2024-09-08 MED ORDER — SPIRONOLACTONE 25 MG PO TABS: 25.0000 mg | ORAL_TABLET | Freq: Every day | ORAL | 3 refills | Status: AC

## 2024-09-08 NOTE — Patient Instructions (Signed)
 Medication Instructions:  - START bempedoic acid  180 mg daily   *If you need a refill on your cardiac medications before your next appointment, please call your pharmacy*  Lab Work: Your provider would like for you to return in 4 months to have the following labs drawn: fasting lipid panel.   Please go to [facility] [address] ([facility] [address], [place] [address] You do not need an appointment.  They are open from 8 am- 4:30 pm.  Lunch from 1:00 pm- 2:00 pm You do need to be fasting.   If you have labs (blood work) drawn today and your tests are completely normal, you will receive your results only by: MyChart Message (if you have MyChart) OR A paper copy in the mail If you have any lab test that is abnormal or we need to change your treatment, we will call you to review the results.  Testing/Procedures: No test ordered today   Follow-Up: At [facility], you and your health needs are our priority.  As part of our continuing mission to provide you with exceptional heart care, our providers are all part of one team.  This team includes your primary Cardiologist (physician) and Advanced Practice Providers or APPs (Physician Assistants and Nurse Practitioners) who all work together to provide you with the care you need, when you need it.  Your next appointment:   6 month(s)  Provider:   You may see Redell Cave, MD or one of the following Advanced Practice Providers on your designated Care Team:   Lonni Meager, NP Lesley Maffucci, PA-C Bernardino Bring, PA-C Cadence [place], PA-C Tylene Lunch, NP Barnie Hila, NP    We recommend signing up for the patient portal called MyChart.  Sign up information is provided on this After Visit Summary.  MyChart is used to connect with patients for Virtual Visits (Telemedicine).  Patients are able to view lab/test results, encounter notes, upcoming appointments, etc.  Non-urgent messages can be sent  to your provider as well.   To learn more about what you can do with MyChart, go to [email or web address].

## 2024-09-08 NOTE — Progress Notes (Signed)
 " Cardiology Office Note:    Date:  09/08/2024   ID:  Amber Holder, DOB 03/30/1946, MRN [id number]  PCP:  Kristina Tinnie POUR, PA-C   [facility] Providers Cardiologist:  Redell Cave, MD     Referring MD: Kristina Tinnie POUR, PA-C   Chief Complaint  Patient presents with   Follow-up     6 month follow up pt has been doing well with no complaints of chest pain, chest pressure or SOB, medication reviewed verbally with patient    History of Present Illness:    Amber Holder is a 79 y.o. female with a hx of CAD (CTO RCA mild to moderate LCx and LAD dz), hyperlipidemia, diabetes, COPD, former smoker x40+ years, PAD/carotid stenosis, IBS, hard of hearing who presents for follow-up.    Had symptoms of myalgias on Lipitor.  Lipitor was held x 2 weeks with improvement in her symptoms.  Leg edema adequately controlled on current dose of torsemide .  No new concerns at this time.  Compliant with medications as prescribed.  Prior notes CMR EF 58%, subendocardial scar comprising 75% wall thickness in the LV inferior wall, inferior wall nonfriable. Coronary CTA 10/2021 calcium  score 790, CTO proximal RCA, nonobstructive LAD and left circumflex disease Echo 10/2021 EF 60 to 65%, impaired laxation. Did not tolerate higher doses of Lipitor, currently on 20 mg daily.  Outside echocardiogram 2021 showed normal systolic function, diastolic dysfunction EF 60% Chest CT 06/2016 showed coronary artery calcification  Past Medical History:  Diagnosis Date   Anxiety 01/02/2012   Arthritis    Asthma    Blockage of coronary artery of heart (HCC)    Chronic kidney disease    Kidney stones   Colon polyps    COPD (chronic obstructive pulmonary disease) (HCC)    Diabetes mellitus without complication (HCC)    GERD (gastroesophageal reflux disease)    HOH (hard of hearing)    Bilateral hearing aids   Kidney stone    Osteoporosis    Sleep apnea    Does not use C-PAP on a regular basis   Urinary  tract infection    Venous stasis     Past Surgical History:  Procedure Laterality Date   APPENDECTOMY     cataract surgery Bilateral    CHOLECYSTECTOMY     COLONOSCOPY     COLONOSCOPY WITH PROPOFOL  N/A 05/28/2015   Procedure: COLONOSCOPY WITH PROPOFOL ;  Surgeon: Gladis RAYMOND Mariner, MD;  Location: [facility] ENDOSCOPY;  Service: Endoscopy;  Laterality: N/A;   COLONOSCOPY WITH PROPOFOL  N/A 10/28/2018   Procedure: COLONOSCOPY WITH PROPOFOL ;  Surgeon: Mariner Gladis RAYMOND, MD;  Location: [facility] ENDOSCOPY;  Service: Endoscopy;  Laterality: N/A;   EYE SURGERY Bilateral 2015   Cataract Extraction with IOL   LITHOTRIPSY  2015   has had many stones   Mastoidotomy  1970   ROTATOR CUFF REPAIR Right 2011   TOTAL HIP ARTHROPLASTY Right 06/14/2016   Procedure: TOTAL HIP ARTHROPLASTY;  Surgeon: Lynwood SHAUNNA Hue, MD;  Location: [facility] ORS;  Service: Orthopedics;  Laterality: Right;    Current Medications: Current Meds  Medication Sig   acetaminophen  (TYLENOL ) 500 MG chewable tablet Chew 1,000 mg by mouth every 8 (eight) hours as needed for pain.   albuterol  (VENTOLIN  HFA) 108 (90 Base) MCG/ACT inhaler Inhale 2 puffs into the lungs every 6 (six) hours as needed for wheezing or shortness of breath.   aspirin  EC 81 MG tablet Take 1 tablet (81 mg total) by mouth daily. Swallow whole.  cholecalciferol  (VITAMIN D ) 400 UNITS TABS tablet Take 2,000 Units by mouth daily.   CRANBERRY PO Take 25,000 mg by mouth daily.   diclofenac  Sodium (VOLTAREN ) 1 % GEL Apply small amount to area of pain daily as needed   estradiol  (ESTRACE ) 0.01 % CREA vaginal cream ESTROGEN CREAM INSTRUCTION DISCARD APPLICATOR APPLY PEA SIZED AMOUNT TO TIP OF FINGER TO URETHRA BEFORE BED. WASH HANDS WELL AFTER APPLICATION. USE MONDAY, WEDNESDAY AND FRIDAY   FARXIGA  10 MG TABS tablet TAKE 1 TABLET BY MOUTH DAILY BEFORE BREAKFAST.   fluconazole  (DIFLUCAN ) 150 MG tablet Take one tab po q week for fungal infection and as needed    Fluticasone -Umeclidin-Vilant (TRELEGY ELLIPTA ) 100-62.5-25 MCG/ACT AEPB Use inhale  orally daily   glucose blood (ONETOUCH VERIO) test strip BLOOD SUGAR TESTING ONCE DAILY . DX E11.65   hydrocortisone  cream 0.5 % Apply topically as needed.   ibandronate  (BONIVA ) 150 MG tablet TAKE 1 TABLET (150 MG TOTAL) BY MOUTH EVERY 30 (THIRTY) DAYS.   ketoconazole (NIZORAL) 2 % cream Apply 1 Application topically daily.   Lancets (ONETOUCH DELICA PLUS LANCET30G) MISC Use  as directed twice a daily DX E11.65   macitentan  (OPSUMIT ) 10 MG tablet Take 1 tablet (10 mg total) by mouth daily.   Magnesium  250 MG TABS Take by mouth. Takes 1 tablet 2-3 times per week   montelukast  (SINGULAIR ) 10 MG tablet Take 1 tablet (10 mg total) by mouth daily as needed.   Multiple Vitamins-Minerals (MULTIVITAMIN ADULTS PO) Take 1 tablet by mouth daily.   nitrofurantoin , macrocrystal-monohydrate, (MACROBID ) 100 MG capsule Take 1 cap twice per day for 7 days.   omeprazole  (PRILOSEC) 40 MG capsule TAKE 1 CAPSULE (40 MG TOTAL) BY MOUTH DAILY.   OXYGEN  Inhale 3 L into the lungs. Pt uses [facility] for Oxygen    potassium chloride  SA (KLOR-CON  M) 20 MEQ tablet TAKE 2 TABLETS BY MOUTH 2 TIMES DAILY.   torsemide  (DEMADEX ) 20 MG tablet TAKE 2 TABLETS (40MG ) BY MOUTH DAILY   Vitamin D , Ergocalciferol , (DRISDOL ) 1.25 MG (50000 UNIT) CAPS capsule TAKE 1 CAPSULE BY MOUTH ONCE A WEEK   [DISCONTINUED] ezetimibe  (ZETIA ) 10 MG tablet Take 1 tablet (10 mg total) by mouth daily.   [DISCONTINUED] spironolactone  (ALDACTONE ) 25 MG tablet Take 1 tablet (25 mg total) by mouth daily.     Allergies:   Iodinated contrast media, Shellfish allergy , Sulfa antibiotics, Sulfasalazine, Percocet [oxycodone-acetaminophen ], and Iodine   Social History   Socioeconomic History   Marital status: Divorced    Spouse name: Not on file   Number of children: Not on file   Years of education: Not on file   Highest education level: Not on file  Occupational  History   Not on file  Tobacco Use   Smoking status: Former    Current packs/day: 0.00    Average packs/day: 1 pack/day for 50.0 years (50.0 ttl pk-yrs)    Types: Cigarettes    Start date: 12/29/1971    Quit date: 12/28/2021    Years since quitting: 2.6    Passive exposure: Never   Smokeless tobacco: Never  Vaping Use   Vaping status: Never Used  Substance and Sexual Activity   Alcohol  use: Not Currently    Comment: very rarely    Drug use: No   Sexual activity: Not Currently    Birth control/protection: Post-menopausal  Other Topics Concern   Not on file  Social History Narrative   Not on file   Social Drivers of Health  Tobacco Use: Medium Risk (09/08/2024)   Patient History    Smoking Tobacco Use: Former    Smokeless Tobacco Use: Never    Passive Exposure: Never  Physicist, Medical Strain: Low Risk  (07/02/2024)   Received from [facility]   Overall Financial Resource Strain (CARDIA)    Difficulty of Paying Living Expenses: Not hard at all  Food Insecurity: No Food Insecurity (07/07/2024)   Epic    Worried About Programme Researcher, Broadcasting/film/video in the Last Year: Never true    Ran Out of Food in the Last Year: Never true  Transportation Needs: No Transportation Needs (07/07/2024)   Epic    Lack of Transportation (Medical): No    Lack of Transportation (Non-Medical): No  Physical Activity: Insufficiently Active (09/21/2022)   Exercise Vital Sign    Days of Exercise per Week: 3 days    Minutes of Exercise per Session: 30 min  Stress: Not on file  Social Connections: Not on file  Depression (PHQ2-9): Medium Risk (05/16/2024)   Depression (PHQ2-9)    PHQ-2 Score: 5  Alcohol  Screen: Low Risk (02/12/2023)   Alcohol  Screen    Last Alcohol  Screening Score (AUDIT): 1  Housing: Unknown (07/07/2024)   Epic    Unable to Pay for Housing in the Last Year: No    Number of Times Moved in the Last Year: Not on file    Homeless in the Last Year: No  Utilities: Not At Risk  (07/07/2024)   Epic    Threatened with loss of utilities: No  Health Literacy: Not on file     Family History: The patient's family history includes Breast cancer in her maternal aunt; Breast cancer (age of onset: 65) in her maternal aunt. There is no history of Bladder Cancer or Kidney cancer.  ROS:   Please see the history of present illness.     All other systems reviewed and are negative.  EKGs/Labs/Other Studies Reviewed:    The following studies were reviewed today:   EKG Interpretation Date/Time:  Monday September 08 2024 11:24:24 EST Ventricular Rate:  65 PR Interval:  182 QRS Duration:  100 QT Interval:  408 QTC Calculation: 424 R Axis:   14  Text Interpretation: Normal sinus rhythm Low voltage QRS Septal infarct Confirmed by Darliss Rogue ([id number]) on 09/08/2024 11:28:18 AM    Recent Labs: 06/13/2024: ALT 20; BUN 20; Creatinine, Ser 0.91; Potassium 4.2; Sodium 141; TSH 2.113 07/08/2024: Hemoglobin 15.3; Platelet Count 230  Recent Lipid Panel    Component Value Date/Time   CHOL 223 (H) 08/26/2024 1324   CHOL 239 (H) 12/24/2020 1154   TRIG 134 08/26/2024 1324   HDL 60 08/26/2024 1324   HDL 55 12/24/2020 1154   CHOLHDL 3.7 08/26/2024 1324   VLDL 27 08/26/2024 1324   LDLCALC 136 (H) 08/26/2024 1324   LDLCALC 168 (H) 12/24/2020 1154   LDLDIRECT 100 (H) 06/11/2023 1032     Risk Assessment/Calculations:          Physical Exam:    VS:  BP (!) 100/56 (BP Location: Left Arm, Patient Position: Sitting)   Pulse 65   Ht 5' 6 (1.676 m)   Wt 186 lb 9.6 oz (84.6 kg)   SpO2 (!) 87%   BMI 30.12 kg/m     Wt Readings from Last 3 Encounters:  09/08/24 186 lb 9.6 oz (84.6 kg)  07/29/24 191 lb (86.6 kg)  07/08/24 188 lb (85.3 kg)     GEN:  Well  nourished, well developed in no acute distress HEENT: Normal NECK: No JVD; No carotid bruits CARDIAC: RRR, no murmurs, rubs, gallops RESPIRATORY: Decreased breath sounds, no wheezing. ABDOMEN: Soft, non-tender,  non-distended MUSCULOSKELETAL:  1+ edema. SKIN: Warm and dry NEUROLOGIC:  Alert and oriented x 3 PSYCHIATRIC:  Normal affect   ASSESSMENT:    1. Coronary artery disease involving native coronary artery of native heart, unspecified whether angina present   2. Pure hypercholesterolemia   3. Leg edema    PLAN:    In order of problems listed above:  CAD, CTO RCA.  Calcium  score 790.  CMR 10/23 showed inferior LV wall nonviable.  EF 58%.  Denies chest pain.  Continue aspirin  81 mg daily, Zetia , bempedoic acid  as below.  Echo EF 60 to 65%, impaired relaxation.  No plans for left heart cath, as patient has CTO, patient also has iodine allergies. Hyperlipidemia, Lipitor caused cramps.  Start bempedoic acid  180 mg daily, continue Zetia  10 mg daily.  Recheck lipid panel in 4 months. Leg edema bilaterally, controlled with increased dose of torsemide .  Continue torsemide  40 mg daily, Aldactone  25 mg daily.  KCl 40 mEq daily.   Follow-up in 6 months  Medication Adjustments/Labs and Tests Ordered: Current medicines are reviewed at length with the patient today.  Concerns regarding medicines are outlined above.  Orders Placed This Encounter  Procedures   Lipid panel   EKG 12-Lead   Meds ordered this encounter  Medications   ezetimibe  (ZETIA ) 10 MG tablet    Sig: Take 1 tablet (10 mg total) by mouth daily.    Dispense:  90 tablet    Refill:  3   spironolactone  (ALDACTONE ) 25 MG tablet    Sig: Take 1 tablet (25 mg total) by mouth daily.    Dispense:  90 tablet    Refill:  3    Patient Instructions  Medication Instructions:  - START bempedoic acid  180 mg daily   *If you need a refill on your cardiac medications before your next appointment, please call your pharmacy*  Lab Work: Your provider would like for you to return in 4 months to have the following labs drawn: fasting lipid panel.   Please go to [facility] [address] [address], [place]  [phone] You do not need an appointment.  They are open from 8 am- 4:30 pm.  Lunch from 1:00 pm- 2:00 pm You do need to be fasting.   If you have labs (blood work) drawn today and your tests are completely normal, you will receive your results only by: MyChart Message (if you have MyChart) OR A paper copy in the mail If you have any lab test that is abnormal or we need to change your treatment, we will call you to review the results.  Testing/Procedures: No test ordered today   Follow-Up: At [facility], you and your health needs are our priority.  As part of our continuing mission to provide you with exceptional heart care, our providers are all part of one team.  This team includes your primary Cardiologist (physician) and Advanced Practice Providers or APPs (Physician Assistants and Nurse Practitioners) who all work together to provide you with the care you need, when you need it.  Your next appointment:   6 month(s)  Provider:   You may see Redell Cave, MD or one of the following Advanced Practice Providers on your designated Care Team:   Lonni Meager, NP Lesley Maffucci, PA-C Ryan  Dunn, PA-C Cadence Furth, PA-C Tylene Lunch, NP Barnie Hila, NP    We recommend signing up for the patient portal called MyChart.  Sign up information is provided on this After Visit Summary.  MyChart is used to connect with patients for Virtual Visits (Telemedicine).  Patients are able to view lab/test results, encounter notes, upcoming appointments, etc.  Non-urgent messages can be sent to your provider as well.   To learn more about what you can do with MyChart, go to [email or web address].              Signed, Redell Cave, MD  09/08/2024 12:18 PM    [facility] "

## 2024-10-13 ENCOUNTER — Ambulatory Visit: Admitting: Physician Assistant

## 2024-12-03 ENCOUNTER — Ambulatory Visit: Admitting: Urology

## 2024-12-04 ENCOUNTER — Ambulatory Visit: Admitting: Urology

## 2025-01-05 ENCOUNTER — Ambulatory Visit: Admitting: Internal Medicine

## 2025-06-15 ENCOUNTER — Ambulatory Visit: Admitting: Physician Assistant
